# Patient Record
Sex: Male | Born: 1945 | Race: White | Hispanic: No | Marital: Married | Smoking: Former smoker
Health system: Southern US, Community
[De-identification: ages and names within clinical notes are randomized; demographics above are authoritative.]

## PROBLEM LIST (undated history)

## (undated) DIAGNOSIS — M1990 Unspecified osteoarthritis, unspecified site: Secondary | ICD-10-CM

## (undated) DIAGNOSIS — G4733 Obstructive sleep apnea (adult) (pediatric): Secondary | ICD-10-CM

## (undated) DIAGNOSIS — I700 Atherosclerosis of aorta: Secondary | ICD-10-CM

## (undated) DIAGNOSIS — Z952 Presence of prosthetic heart valve: Secondary | ICD-10-CM

## (undated) DIAGNOSIS — N529 Male erectile dysfunction, unspecified: Secondary | ICD-10-CM

## (undated) DIAGNOSIS — K759 Inflammatory liver disease, unspecified: Secondary | ICD-10-CM

## (undated) DIAGNOSIS — E119 Type 2 diabetes mellitus without complications: Secondary | ICD-10-CM

## (undated) DIAGNOSIS — K648 Other hemorrhoids: Secondary | ICD-10-CM

## (undated) DIAGNOSIS — K219 Gastro-esophageal reflux disease without esophagitis: Secondary | ICD-10-CM

## (undated) DIAGNOSIS — I219 Acute myocardial infarction, unspecified: Secondary | ICD-10-CM

## (undated) DIAGNOSIS — Z89511 Acquired absence of right leg below knee: Secondary | ICD-10-CM

## (undated) DIAGNOSIS — K22719 Barrett's esophagus with dysplasia, unspecified: Secondary | ICD-10-CM

## (undated) DIAGNOSIS — I5189 Other ill-defined heart diseases: Secondary | ICD-10-CM

## (undated) DIAGNOSIS — Q230 Congenital stenosis of aortic valve: Secondary | ICD-10-CM

## (undated) DIAGNOSIS — I739 Peripheral vascular disease, unspecified: Secondary | ICD-10-CM

## (undated) DIAGNOSIS — N400 Enlarged prostate without lower urinary tract symptoms: Secondary | ICD-10-CM

## (undated) DIAGNOSIS — Z7902 Long term (current) use of antithrombotics/antiplatelets: Secondary | ICD-10-CM

## (undated) DIAGNOSIS — N3281 Overactive bladder: Secondary | ICD-10-CM

## (undated) DIAGNOSIS — D369 Benign neoplasm, unspecified site: Secondary | ICD-10-CM

## (undated) DIAGNOSIS — Q231 Congenital insufficiency of aortic valve: Secondary | ICD-10-CM

## (undated) DIAGNOSIS — G4730 Sleep apnea, unspecified: Secondary | ICD-10-CM

## (undated) DIAGNOSIS — Z8616 Personal history of COVID-19: Secondary | ICD-10-CM

## (undated) DIAGNOSIS — J449 Chronic obstructive pulmonary disease, unspecified: Secondary | ICD-10-CM

## (undated) DIAGNOSIS — E785 Hyperlipidemia, unspecified: Secondary | ICD-10-CM

## (undated) DIAGNOSIS — I350 Nonrheumatic aortic (valve) stenosis: Secondary | ICD-10-CM

## (undated) DIAGNOSIS — R569 Unspecified convulsions: Secondary | ICD-10-CM

## (undated) DIAGNOSIS — H269 Unspecified cataract: Secondary | ICD-10-CM

## (undated) DIAGNOSIS — I10 Essential (primary) hypertension: Secondary | ICD-10-CM

## (undated) DIAGNOSIS — Z955 Presence of coronary angioplasty implant and graft: Secondary | ICD-10-CM

## (undated) DIAGNOSIS — T50905A Adverse effect of unspecified drugs, medicaments and biological substances, initial encounter: Secondary | ICD-10-CM

## (undated) DIAGNOSIS — I2510 Atherosclerotic heart disease of native coronary artery without angina pectoris: Secondary | ICD-10-CM

## (undated) DIAGNOSIS — I779 Disorder of arteries and arterioles, unspecified: Secondary | ICD-10-CM

## (undated) HISTORY — PX: CARDIAC VALVE REPLACEMENT: SHX585

## (undated) HISTORY — PX: TONSILLECTOMY: SUR1361

## (undated) HISTORY — PX: OTHER SURGICAL HISTORY: SHX169

## (undated) HISTORY — PX: THORACOTOMY: SUR1349

## (undated) HISTORY — DX: Unspecified cataract: H26.9

## (undated) HISTORY — PX: CARDIAC CATHETERIZATION: SHX172

## (undated) HISTORY — PX: FOOT AMPUTATION: SHX951

---

## 2002-02-12 DIAGNOSIS — I2510 Atherosclerotic heart disease of native coronary artery without angina pectoris: Secondary | ICD-10-CM

## 2002-02-12 DIAGNOSIS — I214 Non-ST elevation (NSTEMI) myocardial infarction: Secondary | ICD-10-CM

## 2002-02-12 HISTORY — DX: Atherosclerotic heart disease of native coronary artery without angina pectoris: I25.10

## 2002-02-12 HISTORY — PX: LEFT HEART CATH AND CORONARY ANGIOGRAPHY: CATH118249

## 2002-02-12 HISTORY — DX: Non-ST elevation (NSTEMI) myocardial infarction: I21.4

## 2002-02-13 HISTORY — PX: CORONARY ANGIOPLASTY WITH STENT PLACEMENT: SHX49

## 2005-09-26 DIAGNOSIS — Z86018 Personal history of other benign neoplasm: Secondary | ICD-10-CM

## 2005-09-26 HISTORY — DX: Personal history of other benign neoplasm: Z86.018

## 2006-12-25 ENCOUNTER — Ambulatory Visit: Payer: Self-pay | Admitting: Unknown Physician Specialty

## 2009-04-21 ENCOUNTER — Ambulatory Visit: Payer: Self-pay | Admitting: Family Medicine

## 2011-02-14 ENCOUNTER — Ambulatory Visit: Payer: Self-pay | Admitting: Internal Medicine

## 2011-03-29 DIAGNOSIS — Z952 Presence of prosthetic heart valve: Secondary | ICD-10-CM

## 2011-03-29 HISTORY — DX: Presence of prosthetic heart valve: Z95.2

## 2011-03-29 HISTORY — PX: AORTIC VALVE REPLACEMENT: SHX41

## 2011-04-08 ENCOUNTER — Other Ambulatory Visit: Payer: Self-pay | Admitting: Internal Medicine

## 2011-04-27 ENCOUNTER — Encounter: Payer: Self-pay | Admitting: Internal Medicine

## 2012-03-08 ENCOUNTER — Ambulatory Visit: Payer: Self-pay | Admitting: Unknown Physician Specialty

## 2012-03-12 LAB — PATHOLOGY REPORT

## 2014-01-15 DIAGNOSIS — Z952 Presence of prosthetic heart valve: Secondary | ICD-10-CM | POA: Insufficient documentation

## 2014-01-15 DIAGNOSIS — I2510 Atherosclerotic heart disease of native coronary artery without angina pectoris: Secondary | ICD-10-CM | POA: Insufficient documentation

## 2014-01-15 DIAGNOSIS — Q231 Congenital insufficiency of aortic valve: Secondary | ICD-10-CM | POA: Insufficient documentation

## 2014-01-15 DIAGNOSIS — Q2381 Bicuspid aortic valve: Secondary | ICD-10-CM | POA: Insufficient documentation

## 2014-10-16 LAB — HEMOGLOBIN A1C: Hgb A1c MFr Bld: 6.5 % — AB (ref 4.0–6.0)

## 2014-10-20 LAB — LIPID PANEL
Cholesterol: 152 mg/dL (ref 0–200)
HDL: 37 mg/dL (ref 35–70)
LDL CALC: 43 mg/dL
LDL/HDL RATIO: 1.2
Triglycerides: 358 mg/dL — AB (ref 40–160)

## 2014-10-20 LAB — CBC AND DIFFERENTIAL
HCT: 42 % (ref 41–53)
HEMOGLOBIN: 14.5 g/dL (ref 13.5–17.5)
Neutrophils Absolute: 4 /uL
Platelets: 203 10*3/uL (ref 150–399)
WBC: 7 10*3/mL

## 2014-10-20 LAB — HEPATIC FUNCTION PANEL
ALK PHOS: 103 U/L (ref 25–125)
ALT: 19 U/L (ref 10–40)
AST: 33 U/L (ref 14–40)
BILIRUBIN, TOTAL: 0.9 mg/dL

## 2014-10-20 LAB — BASIC METABOLIC PANEL
BUN: 18 mg/dL (ref 4–21)
CREATININE: 1 mg/dL (ref <=1.3)
Potassium: 4.4 mmol/L (ref 3.4–5.3)
SODIUM: 140 mmol/L (ref 137–147)

## 2014-10-20 LAB — TSH: TSH: 2.86 u[IU]/mL (ref 0.41–5.90)

## 2015-02-12 DIAGNOSIS — I10 Essential (primary) hypertension: Secondary | ICD-10-CM | POA: Insufficient documentation

## 2015-02-18 DIAGNOSIS — K2270 Barrett's esophagus without dysplasia: Secondary | ICD-10-CM | POA: Insufficient documentation

## 2015-02-18 DIAGNOSIS — Z86010 Personal history of colonic polyps: Secondary | ICD-10-CM | POA: Insufficient documentation

## 2015-03-17 ENCOUNTER — Encounter: Payer: Self-pay | Admitting: *Deleted

## 2015-03-18 ENCOUNTER — Ambulatory Visit: Payer: PPO | Admitting: Anesthesiology

## 2015-03-18 ENCOUNTER — Encounter
Admission: RE | Disposition: A | Discharge status: Home / Self Care | Payer: Self-pay | Source: Ambulatory Visit | Attending: Unknown Physician Specialty

## 2015-03-18 ENCOUNTER — Ambulatory Visit
Admission: RE | Admit: 2015-03-18 | Discharge: 2015-03-18 | Disposition: A | Discharge status: Home / Self Care | Payer: PPO | Source: Ambulatory Visit | Attending: Unknown Physician Specialty | Admitting: Unknown Physician Specialty

## 2015-03-18 DIAGNOSIS — J449 Chronic obstructive pulmonary disease, unspecified: Secondary | ICD-10-CM | POA: Insufficient documentation

## 2015-03-18 DIAGNOSIS — I10 Essential (primary) hypertension: Secondary | ICD-10-CM | POA: Insufficient documentation

## 2015-03-18 DIAGNOSIS — K2950 Unspecified chronic gastritis without bleeding: Secondary | ICD-10-CM | POA: Diagnosis not present

## 2015-03-18 DIAGNOSIS — R1310 Dysphagia, unspecified: Secondary | ICD-10-CM | POA: Insufficient documentation

## 2015-03-18 DIAGNOSIS — B159 Hepatitis A without hepatic coma: Secondary | ICD-10-CM | POA: Insufficient documentation

## 2015-03-18 DIAGNOSIS — G4730 Sleep apnea, unspecified: Secondary | ICD-10-CM | POA: Diagnosis not present

## 2015-03-18 DIAGNOSIS — Z86010 Personal history of colonic polyps: Secondary | ICD-10-CM | POA: Diagnosis not present

## 2015-03-18 DIAGNOSIS — K5730 Diverticulosis of large intestine without perforation or abscess without bleeding: Secondary | ICD-10-CM | POA: Diagnosis not present

## 2015-03-18 DIAGNOSIS — E119 Type 2 diabetes mellitus without complications: Secondary | ICD-10-CM | POA: Insufficient documentation

## 2015-03-18 DIAGNOSIS — Z6838 Body mass index (BMI) 38.0-38.9, adult: Secondary | ICD-10-CM | POA: Insufficient documentation

## 2015-03-18 DIAGNOSIS — K640 First degree hemorrhoids: Secondary | ICD-10-CM | POA: Insufficient documentation

## 2015-03-18 DIAGNOSIS — Z952 Presence of prosthetic heart valve: Secondary | ICD-10-CM | POA: Insufficient documentation

## 2015-03-18 DIAGNOSIS — D120 Benign neoplasm of cecum: Secondary | ICD-10-CM | POA: Diagnosis not present

## 2015-03-18 DIAGNOSIS — I252 Old myocardial infarction: Secondary | ICD-10-CM | POA: Insufficient documentation

## 2015-03-18 DIAGNOSIS — I2510 Atherosclerotic heart disease of native coronary artery without angina pectoris: Secondary | ICD-10-CM | POA: Diagnosis not present

## 2015-03-18 DIAGNOSIS — Z79899 Other long term (current) drug therapy: Secondary | ICD-10-CM | POA: Diagnosis not present

## 2015-03-18 DIAGNOSIS — K317 Polyp of stomach and duodenum: Secondary | ICD-10-CM | POA: Insufficient documentation

## 2015-03-18 DIAGNOSIS — E785 Hyperlipidemia, unspecified: Secondary | ICD-10-CM | POA: Diagnosis not present

## 2015-03-18 DIAGNOSIS — Z9861 Coronary angioplasty status: Secondary | ICD-10-CM | POA: Insufficient documentation

## 2015-03-18 DIAGNOSIS — Z87891 Personal history of nicotine dependence: Secondary | ICD-10-CM | POA: Insufficient documentation

## 2015-03-18 DIAGNOSIS — Z89439 Acquired absence of unspecified foot: Secondary | ICD-10-CM | POA: Insufficient documentation

## 2015-03-18 DIAGNOSIS — K2270 Barrett's esophagus without dysplasia: Secondary | ICD-10-CM | POA: Diagnosis present

## 2015-03-18 HISTORY — PX: COLONOSCOPY WITH PROPOFOL: SHX5780

## 2015-03-18 HISTORY — DX: Hyperlipidemia, unspecified: E78.5

## 2015-03-18 HISTORY — DX: Inflammatory liver disease, unspecified: K75.9

## 2015-03-18 HISTORY — DX: Barrett's esophagus with dysplasia, unspecified: K22.719

## 2015-03-18 HISTORY — DX: Essential (primary) hypertension: I10

## 2015-03-18 HISTORY — DX: Congenital stenosis of aortic valve: Q23.0

## 2015-03-18 HISTORY — DX: Acute myocardial infarction, unspecified: I21.9

## 2015-03-18 HISTORY — DX: Unspecified convulsions: R56.9

## 2015-03-18 HISTORY — DX: Type 2 diabetes mellitus without complications: E11.9

## 2015-03-18 HISTORY — DX: Atherosclerotic heart disease of native coronary artery without angina pectoris: I25.10

## 2015-03-18 HISTORY — DX: Nonrheumatic aortic (valve) stenosis: I35.0

## 2015-03-18 HISTORY — PX: ESOPHAGOGASTRODUODENOSCOPY: SHX5428

## 2015-03-18 HISTORY — DX: Chronic obstructive pulmonary disease, unspecified: J44.9

## 2015-03-18 HISTORY — DX: Congenital insufficiency of aortic valve: Q23.1

## 2015-03-18 HISTORY — DX: Sleep apnea, unspecified: G47.30

## 2015-03-18 LAB — GLUCOSE, CAPILLARY: Glucose-Capillary: 157 mg/dL — ABNORMAL HIGH (ref 65–99)

## 2015-03-18 SURGERY — COLONOSCOPY WITH PROPOFOL
Anesthesia: General

## 2015-03-18 MED ORDER — EPHEDRINE SULFATE 50 MG/ML IJ SOLN
INTRAMUSCULAR | Status: DC | PRN
  Administered 2015-03-18: 10 mg via INTRAVENOUS
  Administered 2015-03-18: 5 mg via INTRAVENOUS

## 2015-03-18 MED ORDER — PHENYLEPHRINE HCL 10 MG/ML IJ SOLN
INTRAMUSCULAR | Status: DC | PRN
  Administered 2015-03-18: 50 ug via INTRAVENOUS

## 2015-03-18 MED ORDER — FENTANYL CITRATE (PF) 100 MCG/2ML IJ SOLN
INTRAMUSCULAR | Status: DC | PRN
  Administered 2015-03-18: 50 ug via INTRAVENOUS

## 2015-03-18 MED ORDER — SODIUM CHLORIDE 0.9 % IV SOLN
INTRAVENOUS | Status: DC
  Administered 2015-03-18: 08:00:00 via INTRAVENOUS

## 2015-03-18 MED ORDER — MIDAZOLAM HCL 2 MG/2ML IJ SOLN
INTRAMUSCULAR | Status: DC | PRN
  Administered 2015-03-18: 1 mg via INTRAVENOUS

## 2015-03-18 MED ORDER — BUTAMBEN-TETRACAINE-BENZOCAINE 2-2-14 % EX AERO
INHALATION_SPRAY | CUTANEOUS | Status: AC
  Filled 2015-03-18: qty 20

## 2015-03-18 MED ORDER — PIPERACILLIN-TAZOBACTAM 3.375 G IVPB 30 MIN
3.3750 g | Freq: Once | INTRAVENOUS | Status: AC
  Administered 2015-03-18: 3.375 g via INTRAVENOUS
  Filled 2015-03-18: qty 50

## 2015-03-18 MED ORDER — SODIUM CHLORIDE 0.9 % IV SOLN: INTRAVENOUS | Status: DC

## 2015-03-18 MED ORDER — GLYCOPYRROLATE 0.2 MG/ML IJ SOLN
INTRAMUSCULAR | Status: DC | PRN
  Administered 2015-03-18: 0.1 mg via INTRAVENOUS

## 2015-03-18 MED ORDER — PROPOFOL INFUSION 10 MG/ML OPTIME
INTRAVENOUS | Status: DC | PRN
  Administered 2015-03-18: 140 ug/kg/min via INTRAVENOUS

## 2015-03-18 MED ORDER — LIDOCAINE HCL (CARDIAC) 20 MG/ML IV SOLN
INTRAVENOUS | Status: DC | PRN
  Administered 2015-03-18: 50 mg via INTRAVENOUS

## 2015-03-18 NOTE — OR Nursing (Signed)
cetacine spray used at [phone]

## 2015-03-18 NOTE — Anesthesia Postprocedure Evaluation (Signed)
  Anesthesia Post-op Note  Patient: Andrew Lyons  Procedure(s) Performed: Procedure(s): COLONOSCOPY WITH PROPOFOL (N/A) ESOPHAGOGASTRODUODENOSCOPY (EGD) (N/A)  Anesthesia type:General  Patient location: PACU  Post pain: Pain level controlled  Post assessment: Post-op Vital signs reviewed, Patient's Cardiovascular Status Stable, Respiratory Function Stable, Patent Airway and No signs of Nausea or vomiting  Post vital signs: Reviewed and stable  Last Vitals:  Filed Vitals:   03/18/15 0910  BP: 107/47  Pulse: 85  Temp:   Resp: 22    Level of consciousness: awake, alert  and patient cooperative  Complications: No apparent anesthesia complications

## 2015-03-18 NOTE — Op Note (Signed)
[facility] Gastroenterology Patient Name: Andrew Lyons Procedure Date: 03/18/2015 8:08 AM MRN: [id number] Account #: [id number] Date of Birth: 06-26-1946 Admit Type: Outpatient Age: 69 Room: [facility] ENDO ROOM 4 Gender: Male Note Status: Finalized Procedure:         Upper GI endoscopy Indications:       Follow-up of Barrett's esophagus Providers:         [name], MD Referring MD:      Janine Ores. Rosanna Randy, MD (Referring MD) Medicines:         Propofol per Anesthesia Complications:     No immediate complications. Procedure:         Pre-Anesthesia Assessment:                    - After reviewing the risks and benefits, the patient was                     deemed in satisfactory condition to undergo the procedure.                    After obtaining informed consent, the endoscope was passed                     under direct vision. Throughout the procedure, the                     patient's blood pressure, pulse, and oxygen saturations                     were monitored continuously. The Olympus GIF-160 endoscope                     (S#. [id number]) was introduced through the mouth, and                     advanced to the second part of duodenum. The upper GI                     endoscopy was accomplished without difficulty. The patient                     tolerated the procedure well. Findings:      There were esophageal mucosal changes secondary to established       short-segment Barrett's disease present at the gastroesophageal       junction. The maximum longitudinal extent of these mucosal changes was 1       cm in length. Mucosa was biopsied with a cold forceps for histology. One       specimen bottle was sent to pathology.      Five small sessile polyps with no bleeding and stigmata of recent       bleeding were found in the gastric body. These polyps were removed with       a hot snare. Resection complete ;and none recovered, they look benign      The  examined duodenum was normal. Impression:        - Esophageal mucosal changes secondary to established                     short-segment Barrett's disease. Biopsied.                    - Five gastric polyps. Resected and retrieved.                    -  Normal examined duodenum. Recommendation:    - Await pathology results. [name], MD 03/18/2015 8:29:08 AM This report has been signed electronically. Number of Addenda: 0 Note Initiated On: 03/18/2015 8:08 AM      [facility]

## 2015-03-18 NOTE — Anesthesia Preprocedure Evaluation (Signed)
Anesthesia Evaluation  Patient identified by MRN, date of birth, ID band Patient awake    Reviewed: Allergy & Precautions, NPO status , Patient's Chart, lab work & pertinent test results  History of Anesthesia Complications Negative for: history of anesthetic complications  Airway Mallampati: II       Dental  (+) Teeth Intact   Pulmonary COPDformer smoker,    Pulmonary exam normal       Cardiovascular hypertension, Pt. on medications and Pt. on home beta blockers + CAD and + Past MI Rhythm:Regular     Neuro/Psych Seizures -,  negative psych ROS   GI/Hepatic negative GI ROS, (+) Hepatitis -, A  Endo/Other  diabetes, Well Controlled, Type 2, Oral Hypoglycemic AgentsMorbid obesity  Renal/GU negative Renal ROS  negative genitourinary   Musculoskeletal negative musculoskeletal ROS (+)   Abdominal (+) + obese,   Peds negative pediatric ROS (+)  Hematology negative hematology ROS (+)   Anesthesia Other Findings   Reproductive/Obstetrics negative OB ROS                             Anesthesia Physical Anesthesia Plan  ASA: III  Anesthesia Plan: General   Post-op Pain Management:    Induction: Intravenous  Airway Management Planned: Nasal Cannula  Additional Equipment:   Intra-op Plan:   Post-operative Plan:   Informed Consent: I have reviewed the patients History and Physical, chart, labs and discussed the procedure including the risks, benefits and alternatives for the proposed anesthesia with the patient or authorized representative who has indicated his/her understanding and acceptance.     Plan Discussed with: CRNA  Anesthesia Plan Comments:         Anesthesia Quick Evaluation

## 2015-03-18 NOTE — Op Note (Signed)
[facility] Gastroenterology Patient Name: Andrew Lyons Procedure Date: 03/18/2015 8:08 AM MRN: [id number] Account #: [id number] Date of Birth: Jan 23, 1946 Admit Type: Outpatient Age: 69 Room: [facility] ENDO ROOM 4 Gender: Male Note Status: Finalized Procedure:         Colonoscopy Indications:       Personal history of colonic polyps Providers:         [name], MD Referring MD:      Janine Ores. Rosanna Randy, MD (Referring MD) Medicines:         Propofol per Anesthesia Complications:     No immediate complications. Procedure:         Pre-Anesthesia Assessment:                    - After reviewing the risks and benefits, the patient was                     deemed in satisfactory condition to undergo the procedure.                    After obtaining informed consent, the colonoscope was                     passed under direct vision. Throughout the procedure, the                     patient's blood pressure, pulse, and oxygen saturations                     were monitored continuously. The Colonoscope was                     introduced through the anus and advanced to the the cecum,                     identified by appendiceal orifice and ileocecal valve. The                     colonoscopy was performed without difficulty. The patient                     tolerated the procedure well. The quality of the bowel                     preparation was good. Findings:      A diminutive polyp was found in the cecum. The polyp was sessile. The       polyp was removed with a jumbo cold forceps. Resection and retrieval       were complete.      Multiple medium-mouthed diverticula were found in the sigmoid colon, in       the descending colon and in the transverse colon.      Internal hemorrhoids were found during endoscopy. The hemorrhoids were       medium-sized and Grade I (internal hemorrhoids that do not prolapse).      Prostate has multiple nodules, small, on rectal  exam. Impression:        - One diminutive polyp in the cecum. Resected and                     retrieved.                    - Diverticulosis in the sigmoid colon, in the  descending                     colon and in the transverse colon.                    - Internal hemorrhoids. Recommendation:    - Await pathology results.                    - The findings and recommendations were discussed with the                     patient's family.                    SEE A UROLOGIST [name], MD 03/18/2015 8:49:46 AM This report has been signed electronically. Number of Addenda: 0 Note Initiated On: 03/18/2015 8:08 AM Scope Withdrawal Time: 0 hours 9 minutes 54 seconds  Total Procedure Duration: 0 hours 13 minutes 55 seconds       [facility]

## 2015-03-18 NOTE — Transfer of Care (Signed)
Immediate Anesthesia Transfer of Care Note  Patient: Andrew Lyons  Procedure(s) Performed: Procedure(s): COLONOSCOPY WITH PROPOFOL (N/A) ESOPHAGOGASTRODUODENOSCOPY (EGD) (N/A)  Patient Location: PACU  Anesthesia Type:General  Level of Consciousness: awake and sedated  Airway & Oxygen Therapy: Patient Spontanous Breathing and Patient connected to nasal cannula oxygen  Post-op Assessment: Report given to RN and Post -op Vital signs reviewed and stable  Post vital signs: Reviewed and stable  Last Vitals:  Filed Vitals:   03/18/15 0850  BP: 121/54  Pulse: 82  Temp:   Resp: 24    Complications: No apparent anesthesia complications

## 2015-03-18 NOTE — H&P (Signed)
Primary Care Physician:  No primary care provider on file. Primary Gastroenterologist:  Dr. Vira Agar  Pre-Procedure History & Physical: HPI:  Andrew Lyons is a 69 y.o. male is here for an endoscopy and colonoscopy.   Past Medical History  Diagnosis Date  . Coronary artery disease   . Hypertension   . Hyperlipidemia   . Sleep apnea   . Myocardial infarction   . Seizures   . COPD (chronic obstructive pulmonary disease)   . Hepatitis   . Barrett's esophagus with dysplasia   . 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 Diabetes mellitus without complication     Past Surgical History  Procedure Laterality Date  . Cardiac catheterization      with Angioplasty  . Cardiac valve replacement      Aortic Valve Replacement  . Tonsillectomy    . Thoracotomy Right   . Foot amputation      Prior to Admission medications   Medication Sig Start Date End Date Taking? Authorizing Provider  amLODipine-benazepril (LOTREL) 5-10 MG per capsule Take 1 capsule by mouth daily.   Yes Historical Provider, MD  aspirin 325 MG EC tablet Take 325 mg by mouth daily.   Yes Historical Provider, MD  Canagliflozin-Metformin HCl (INVOKAMET) 150-500 MG TABS Take 1 tablet by mouth every morning.   Yes Historical Provider, MD  co-enzyme Q-10 30 MG capsule Take 100 mg by mouth daily.   Yes Historical Provider, MD  Liraglutide (VICTOZA) 18 MG/3ML SOPN Inject 1.2 mg into the skin daily.   Yes Historical Provider, MD  metoprolol succinate (TOPROL-XL) 100 MG 24 hr tablet Take 100 mg by mouth daily. Take with or immediately following a meal.   Yes Historical Provider, MD  montelukast (SINGULAIR) 10 MG tablet Take 10 mg by mouth at bedtime.   Yes Historical Provider, MD  naftifine (NAFTIN) 1 % cream Apply topically daily.   Yes Historical Provider, MD  omeprazole (PRILOSEC) 20 MG capsule Take 20 mg by mouth daily.   Yes Historical Provider, MD  rosuvastatin (CRESTOR) 20 MG tablet Take 20 mg by mouth daily.    Yes Historical Provider, MD  sildenafil (VIAGRA) 100 MG tablet Take 100 mg by mouth daily as needed for erectile dysfunction.   Yes Historical Provider, MD    Allergies as of 02/18/2015  . (Not on File)    History reviewed. No pertinent family history.  History   Social History  . Marital Status: Married    Spouse Name: N/A  . Number of Children: N/A  . Years of Education: N/A   Occupational History  . Not on file.   Social History Main Topics  . Smoking status: Former Research scientist (life sciences)  . Smokeless tobacco: Never Used  . Alcohol Use: No  . Drug Use: No  . Sexual Activity: Not on file   Other Topics Concern  . Not on file   Social History Narrative    Review of Systems: See HPI, otherwise negative ROS  Physical Exam: BP 138/73 mmHg  Pulse 68  Temp(Src) 97.3 F (36.3 C) (Tympanic)  Resp 17  Ht 5\' 10"  (1.778 m)  Wt 122.471 kg (270 lb)  BMI 38.74 kg/m2  SpO2 98% General:   Alert,  pleasant and cooperative in NAD Head:  Normocephalic and atraumatic. Neck:  Supple; no masses or thyromegaly. Lungs:  Clear throughout to auscultation.    Heart:  Regular rate and rhythm. Abdomen:  Soft, nontender and nondistended. Normal bowel sounds, without guarding,  and without rebound.   Neurologic:  Alert and  oriented x4;  grossly normal neurologically.  Impression/Plan: Andrew Lyons is here for an endoscopy and colonoscopy to be performed for personal history of colon polyps and follow up Barretts esophagus  Risks, benefits, limitations, and alternatives regarding  endoscopy and colonoscopy have been reviewed with the patient.  Questions have been answered.  All parties agreeable.   Gaylyn Cheers, MD  03/18/2015, 8:05 AM   Primary Care Physician:  No primary care provider on file. Primary Gastroenterologist:  Dr. Vira Agar  Pre-Procedure History & Physical: HPI:  Andrew Lyons is a 69 y.o. male is here for an endoscopy and colonoscopy.   Past Medical History   Diagnosis Date  . Coronary artery disease   . Hypertension   . Hyperlipidemia   . Sleep apnea   . Myocardial infarction   . Seizures   . COPD (chronic obstructive pulmonary disease)   . Hepatitis   . Barrett's esophagus with dysplasia   . 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 Diabetes mellitus without complication     Past Surgical History  Procedure Laterality Date  . Cardiac catheterization      with Angioplasty  . Cardiac valve replacement      Aortic Valve Replacement  . Tonsillectomy    . Thoracotomy Right   . Foot amputation      Prior to Admission medications   Medication Sig Start Date End Date Taking? Authorizing Provider  amLODipine-benazepril (LOTREL) 5-10 MG per capsule Take 1 capsule by mouth daily.   Yes Historical Provider, MD  aspirin 325 MG EC tablet Take 325 mg by mouth daily.   Yes Historical Provider, MD  Canagliflozin-Metformin HCl (INVOKAMET) 150-500 MG TABS Take 1 tablet by mouth every morning.   Yes Historical Provider, MD  co-enzyme Q-10 30 MG capsule Take 100 mg by mouth daily.   Yes Historical Provider, MD  Liraglutide (VICTOZA) 18 MG/3ML SOPN Inject 1.2 mg into the skin daily.   Yes Historical Provider, MD  metoprolol succinate (TOPROL-XL) 100 MG 24 hr tablet Take 100 mg by mouth daily. Take with or immediately following a meal.   Yes Historical Provider, MD  montelukast (SINGULAIR) 10 MG tablet Take 10 mg by mouth at bedtime.   Yes Historical Provider, MD  naftifine (NAFTIN) 1 % cream Apply topically daily.   Yes Historical Provider, MD  omeprazole (PRILOSEC) 20 MG capsule Take 20 mg by mouth daily.   Yes Historical Provider, MD  rosuvastatin (CRESTOR) 20 MG tablet Take 20 mg by mouth daily.   Yes Historical Provider, MD  sildenafil (VIAGRA) 100 MG tablet Take 100 mg by mouth daily as needed for erectile dysfunction.   Yes Historical Provider, MD    Allergies as of 02/18/2015  . (Not on File)    History reviewed. No pertinent  family history.  History   Social History  . Marital Status: Married    Spouse Name: N/A  . Number of Children: N/A  . Years of Education: N/A   Occupational History  . Not on file.   Social History Main Topics  . Smoking status: Former Research scientist (life sciences)  . Smokeless tobacco: Never Used  . Alcohol Use: No  . Drug Use: No  . Sexual Activity: Not on file   Other Topics Concern  . Not on file   Social History Narrative    Review of Systems: See HPI, otherwise negative ROS  Physical Exam: BP 138/73 mmHg  Pulse 68  Temp(Src) 97.3 F (36.3 C) (Tympanic)  Resp 17  Ht 5\' 10"  (1.778 m)  Wt 122.471 kg (270 lb)  BMI 38.74 kg/m2  SpO2 98% General:   Alert,  pleasant and cooperative in NAD Head:  Normocephalic and atraumatic. Neck:  Supple; no masses or thyromegaly. Lungs:  Clear throughout to auscultation.    Heart:  Regular rate and rhythm. Abdomen:  Soft, nontender and nondistended. Normal bowel sounds, without guarding, and without rebound.   Neurologic:  Alert and  oriented x4;  grossly normal neurologically.  Impression/Plan: Andrew Lyons is here for an endoscopy and colonoscopy to be performed for personal history of colon polyps and history of Barretts esophagus.  Risks, benefits, limitations, and alternatives regarding  endoscopy and colonoscopy have been reviewed with the patient.  Questions have been answered.  All parties agreeable.   Gaylyn Cheers, MD  03/18/2015, 8:05 AM

## 2015-03-18 NOTE — Anesthesia Procedure Notes (Signed)
Performed by: COOK-MARTIN, Nashia Remus Pre-anesthesia Checklist: Patient identified, Emergency Drugs available, Suction available, Patient being monitored and Timeout performed Patient Re-evaluated:Patient Re-evaluated prior to inductionOxygen Delivery Method: Nasal cannula Preoxygenation: Pre-oxygenation with 100% oxygen Intubation Type: IV induction Airway Equipment and Method: Bite block     

## 2015-03-20 LAB — SURGICAL PATHOLOGY

## 2015-03-31 DIAGNOSIS — J309 Allergic rhinitis, unspecified: Secondary | ICD-10-CM | POA: Insufficient documentation

## 2015-03-31 DIAGNOSIS — N529 Male erectile dysfunction, unspecified: Secondary | ICD-10-CM | POA: Insufficient documentation

## 2015-03-31 DIAGNOSIS — E782 Mixed hyperlipidemia: Secondary | ICD-10-CM | POA: Insufficient documentation

## 2015-03-31 DIAGNOSIS — I10 Essential (primary) hypertension: Secondary | ICD-10-CM | POA: Insufficient documentation

## 2015-03-31 DIAGNOSIS — R5383 Other fatigue: Secondary | ICD-10-CM

## 2015-03-31 DIAGNOSIS — K219 Gastro-esophageal reflux disease without esophagitis: Secondary | ICD-10-CM | POA: Insufficient documentation

## 2015-03-31 DIAGNOSIS — E669 Obesity, unspecified: Secondary | ICD-10-CM | POA: Insufficient documentation

## 2015-03-31 DIAGNOSIS — N400 Enlarged prostate without lower urinary tract symptoms: Secondary | ICD-10-CM | POA: Insufficient documentation

## 2015-03-31 DIAGNOSIS — G4733 Obstructive sleep apnea (adult) (pediatric): Secondary | ICD-10-CM | POA: Insufficient documentation

## 2015-03-31 DIAGNOSIS — M1990 Unspecified osteoarthritis, unspecified site: Secondary | ICD-10-CM | POA: Insufficient documentation

## 2015-03-31 DIAGNOSIS — E785 Hyperlipidemia, unspecified: Secondary | ICD-10-CM | POA: Insufficient documentation

## 2015-03-31 DIAGNOSIS — E119 Type 2 diabetes mellitus without complications: Secondary | ICD-10-CM | POA: Insufficient documentation

## 2015-03-31 DIAGNOSIS — I2510 Atherosclerotic heart disease of native coronary artery without angina pectoris: Secondary | ICD-10-CM | POA: Insufficient documentation

## 2015-03-31 DIAGNOSIS — R5381 Other malaise: Secondary | ICD-10-CM | POA: Insufficient documentation

## 2015-04-09 ENCOUNTER — Telehealth: Payer: Self-pay | Admitting: Family Medicine

## 2015-04-09 NOTE — Telephone Encounter (Signed)
Pt contacted office for refill request on the following medications: Canagliflozin-Metformin HCl (INVOKAMET) 150-500 MG TABS.  Pt is request 180 pills for 90 day supply.  [facility].  CB#[phone]/MW  Pt wife is also asking if there is a Rx for Canagliflozin-Metformin HCl (INVOKAMET) 150-500 MG and Liraglutide (VICTOZA) 18 MG/3ML that will be a little cheaper?

## 2015-04-09 NOTE — Telephone Encounter (Signed)
There is no generic for either one of these meds. He can stop one of them and see how his diabetes does for the next A1c.

## 2015-04-09 NOTE — Telephone Encounter (Signed)
Patient wants to see about something in place of Invokamet and Victoza that would be cheaper.

## 2015-04-10 NOTE — Telephone Encounter (Signed)
Patient just got Victoza filled and he thinks he is ok on Invokamet for now.  He will try to come off of the Victoza when this 3 month supply runs out since it is the more expensive one.   Andrew Lyons

## 2015-04-14 ENCOUNTER — Encounter: Payer: Self-pay | Admitting: Urology

## 2015-04-14 ENCOUNTER — Ambulatory Visit (INDEPENDENT_AMBULATORY_CARE_PROVIDER_SITE_OTHER): Payer: PPO | Admitting: Urology

## 2015-04-14 VITALS — BP 143/72 | HR 68 | Ht 70.0 in | Wt 267.8 lb

## 2015-04-14 DIAGNOSIS — N390 Urinary tract infection, site not specified: Secondary | ICD-10-CM | POA: Diagnosis not present

## 2015-04-14 DIAGNOSIS — N400 Enlarged prostate without lower urinary tract symptoms: Secondary | ICD-10-CM | POA: Diagnosis not present

## 2015-04-14 LAB — URINALYSIS, COMPLETE
BILIRUBIN UA: NEGATIVE
Ketones, UA: NEGATIVE
LEUKOCYTES UA: NEGATIVE
NITRITE UA: NEGATIVE
PROTEIN UA: NEGATIVE
RBC UA: NEGATIVE
SPEC GRAV UA: 1.015 (ref 1.005–1.030)
UUROB: 0.2 mg/dL (ref 0.2–1.0)
pH, UA: 5 (ref 5.0–7.5)

## 2015-04-14 LAB — MICROSCOPIC EXAMINATION
Bacteria, UA: NONE SEEN
RBC, UA: NONE SEEN /hpf (ref 0–?)

## 2015-04-14 MED ORDER — TAMSULOSIN HCL 0.4 MG PO CAPS
0.4000 mg | ORAL_CAPSULE | Freq: Every day | ORAL | Status: DC
Start: 2015-04-14 — End: 2015-09-21

## 2015-04-14 NOTE — Progress Notes (Signed)
04/14/2015 11:45 AM   Andrew Lyons 08/06/1946 [id number]  Referring provider: [name]., MD [address] [place],  [address]  Chief Complaint  Patient presents with  . Other    new pt, possible prostate cancer     HPI: Patient presents with a question of need for circumcision and a report from rectal exam from another physician of nodules. No nodules are felt on the prostate but calculi are felt within the prostate. These are not nodules. He's had 1 episode of balanitis over the past couple of years. He is a diabetic. At the present time is foreskin is easily retracted and no circumcision as proposed. If he has several more episodes of balanitis then we certainly would consider doing circumcision. Additionally patient starts and stops his urine he does not have a good full stream. I have given him tamsulosin for this. He will follow up in 3 months. His sex life is good but he utilizes 20 mg of either Cialis or Viagra. 100 mg of Viagra caused too much flushing. He is not on any nitroglycerin.     PMH: Past Medical History  Diagnosis Date  . Coronary artery disease   . Hypertension   . Hyperlipidemia   . Sleep apnea   . Myocardial infarction   . Seizures   . COPD (chronic obstructive pulmonary disease)   . Hepatitis   . Barrett's esophagus with dysplasia   . 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 Diabetes mellitus without complication     Surgical History: Past Surgical History  Procedure Laterality Date  . Cardiac catheterization      with Angioplasty  . Cardiac valve replacement      Aortic Valve Replacement  . Tonsillectomy    . Thoracotomy Right   . Foot amputation    . Colonoscopy with propofol N/A 03/18/2015    Procedure: COLONOSCOPY WITH PROPOFOL;  Surgeon: [name], MD;  Location: [facility] ENDOSCOPY;  Service: Endoscopy;  Laterality: N/A;  . Esophagogastroduodenoscopy N/A 03/18/2015    Procedure:  ESOPHAGOGASTRODUODENOSCOPY (EGD);  Surgeon: [name], MD;  Location: [facility] ENDOSCOPY;  Service: Endoscopy;  Laterality: N/A;    Home Medications:    Medication List       This list is accurate as of: 04/14/15 11:45 AM.  Always use your most recent med list.               amLODipine-benazepril 5-10 MG per capsule  Commonly known as:  LOTREL  Take 1 capsule by mouth daily.     aspirin 325 MG EC tablet  Take 325 mg by mouth daily.     co-enzyme Q-10 30 MG capsule  Take 100 mg by mouth daily.     econazole nitrate 1 % cream  ECONAZOLE NITRATE, 1% (External Cream)  1 (one) application to rash twice a day for 0 days  Quantity: 15;  Refills: 0   Ordered :29-Dec-2014  Chrismon, Myrtie Soman;  Started 29-Dec-2014 Active     INVOKAMET 150-500 MG Tabs  Generic drug:  Canagliflozin-Metformin HCl  Take 1 tablet by mouth every morning.     metoprolol succinate 100 MG 24 hr tablet  Commonly known as:  TOPROL-XL  Take 100 mg by mouth daily. Take with or immediately following a meal.     montelukast 10 MG tablet  Commonly known as:  SINGULAIR  Take 10 mg by mouth at bedtime.     naftifine 1 %  cream  Commonly known as:  NAFTIN  Apply topically daily.     omeprazole 40 MG capsule  Commonly known as:  PRILOSEC  Take 40 mg by mouth daily.     rosuvastatin 20 MG tablet  Commonly known as:  CRESTOR  Take 20 mg by mouth daily.     sildenafil 100 MG tablet  Commonly known as:  VIAGRA  Take 100 mg by mouth daily as needed for erectile dysfunction.     VICTOZA 18 MG/3ML Sopn  Generic drug:  Liraglutide  Inject 1.2 mg into the skin daily.        Allergies:  Allergies  Allergen Reactions  . Zyban [Bupropion]   . Zocor [Simvastatin]   . Iodinated Diagnostic Agents Rash    had a red chest -unusre of what kind of dye it was    Family History: Family History  Problem Relation Age of Onset  . Obesity Son   . Diabetes Brother   . Hypertension Brother     Social History:   reports that he has quit smoking. He has never used smokeless tobacco. He reports that he drinks alcohol. He reports that he does not use illicit drugs.  ROS: UROLOGY Frequent Urination?: Yes Hard to postpone urination?: No Burning/pain with urination?: No Get up at night to urinate?: Yes Leakage of urine?: No Urine stream starts and stops?: Yes Trouble starting stream?: Yes Do you have to strain to urinate?: Yes Blood in urine?: No Urinary tract infection?: No Sexually transmitted disease?: No Injury to kidneys or bladder?: No Painful intercourse?: No Weak stream?: Yes Erection problems?: Yes Penile pain?: No  Gastrointestinal Nausea?: No Vomiting?: No Indigestion/heartburn?: Yes Diarrhea?: No Constipation?: No  Constitutional Fever: No Night sweats?: No Weight loss?: No Fatigue?: No  Skin Skin rash/lesions?: No Itching?: No  Eyes Blurred vision?: No Double vision?: No  Ears/Nose/Throat Sore throat?: No Sinus problems?: No  Hematologic/Lymphatic Swollen glands?: No Easy bruising?: Yes  Cardiovascular Leg swelling?: No Chest pain?: No  Respiratory Cough?: No Shortness of breath?: No  Endocrine Excessive thirst?: No  Musculoskeletal Back pain?: No Joint pain?: No  Neurological Headaches?: No Dizziness?: No  Psychologic Depression?: No Anxiety?: No  Physical Exam: BP 143/72 mmHg  Pulse 68  Ht 5\' 10"  (1.778 m)  Wt 267 lb 12.8 oz (121.473 kg)  BMI 38.43 kg/m2  Constitutional:  Alert and oriented, No acute distress. HEENT: [place] AT, moist mucus membranes.  Trachea midline, no masses. Cardiovascular: No clubbing, cyanosis, or edema. Respiratory: Normal respiratory effort, no increased work of breathing. GI: Abdomen is soft, nontender, nondistended, no abdominal masses GU: No CVA tenderness. Prostate small with calculi in the prostate and no distinct nodule. The prostate is symmetrical and firm. The penis is uncircumcised but the foreskin is  easily retracted with no evidences of diabetic stricturing of the skin. The testes are descended and normal. Patient is obviously overweight penile shaft within the mons pubis is normal field there are hemorrhoids on the rectal exam but no rectal masses. Skin: No rashes, bruises or suspicious lesions. Lymph: No cervical or inguinal adenopathy. Neurologic: Grossly intact, no focal deficits, moving all 4 extremities. Psychiatric: Normal mood and affect.  Laboratory Data: Lab Results  Component Value Date   WBC 7.0 10/20/2014   HGB 14.5 10/20/2014   HCT 42 10/20/2014   PLT 203 10/20/2014    Lab Results  Component Value Date   CREATININE 1.0 10/20/2014    No results found for: PSA  No results  found for: TESTOSTERONE  No results found for: HGBA1C  Urinalysis No results found for: COLORURINE, APPEARANCEUR, LABSPEC, PHURINE, GLUCOSEU, HGBUR, BILIRUBINUR, KETONESUR, PROTEINUR, UROBILINOGEN, NITRITE, LEUKOCYTESUR  Pertinent Imaging:none  Assessment & Plan:  Incomplete voiding. History of 1 episode of balanitis. No need for circumcision at this time but will obtain his PSA from his family doctor Dr. Rosanna Randy. Have placed the patient on 3 months of tamsulosin 0.4 mg daily for his voiding dysfunction  1. Recurrent UTI  - Urinalysis, Complete   No Follow-up on file.  Collier Flowers, [facility] [address], [place], [place] [address] [phone]

## 2015-04-22 ENCOUNTER — Ambulatory Visit (INDEPENDENT_AMBULATORY_CARE_PROVIDER_SITE_OTHER): Payer: PPO | Admitting: Family Medicine

## 2015-04-22 ENCOUNTER — Encounter: Payer: Self-pay | Admitting: Family Medicine

## 2015-04-22 VITALS — BP 126/72 | HR 80 | Temp 97.5°F | Resp 16 | Wt 270.0 lb

## 2015-04-22 DIAGNOSIS — E785 Hyperlipidemia, unspecified: Secondary | ICD-10-CM

## 2015-04-22 DIAGNOSIS — I2510 Atherosclerotic heart disease of native coronary artery without angina pectoris: Secondary | ICD-10-CM

## 2015-04-22 DIAGNOSIS — E118 Type 2 diabetes mellitus with unspecified complications: Secondary | ICD-10-CM

## 2015-04-22 DIAGNOSIS — I10 Essential (primary) hypertension: Secondary | ICD-10-CM | POA: Diagnosis not present

## 2015-04-22 LAB — POCT GLYCOSYLATED HEMOGLOBIN (HGB A1C): Hemoglobin A1C: 6.6

## 2015-04-22 NOTE — Progress Notes (Signed)
Patient ID: Andrew Lyons, male   DOB: 10/04/1945, 69 y.o.   MRN: [id number]    Subjective:  HPI  Diabetes Mellitus Type II, Follow-up:   No results found for: HGBA1C Last seen for diabetes 6 months ago.  Management changes included none. He reports good compliance with treatment. He is not having side effects.  Current symptoms include polyuria, coming from prostate, Dr. Elnoria Howard started him om Flomax. Home blood sugar records: Not being checked  Episodes of hypoglycemia? no   Current Insulin Regimen: n/a Most Recent Eye Exam: 3-4 months ago Weight trend: stable  ------------------------------------------------------------------------   Hypertension, follow-up:  BP Readings from Last 3 Encounters:  04/22/15 126/72  04/14/15 143/72  12/29/14 128/78    He was last seen for hypertension 6 months ago.  BP at that visit was 128/78. Management changes since that visit include .He reports good compliance with treatment. He is not having side effects.    Outside blood pressures are not being check. He is experiencing none.  Patient denies none.   Cardiovascular risk factors include none.   ------------------------------------------------------------------------    Lipid/Cholesterol, Follow-up:   Last seen for this 6 months ago.  Management changes since that visit include none.  Last Lipid Panel: No results found for: CHOL, TRIG, HDL, CHOLHDL, VLDL, LDLCALC, LDLDIRECT  He reports good compliance with treatment. He is not having side effects.   Wt Readings from Last 3 Encounters:  04/22/15 270 lb (122.471 kg)  04/14/15 267 lb 12.8 oz (121.473 kg)  12/29/14 271 lb (122.925 kg)    ------------------------------------------------------------------------     Prior to Admission medications   Medication Sig Start Date End Date Taking? Authorizing Provider  amLODipine-benazepril (LOTREL) 5-10 MG per capsule Take 1 capsule by mouth daily.   Yes Historical  Provider, MD  aspirin 325 MG EC tablet Take 325 mg by mouth daily.   Yes Historical Provider, MD  Canagliflozin-Metformin HCl (INVOKAMET) 150-500 MG TABS Take 1 tablet by mouth every morning.   Yes Historical Provider, MD  co-enzyme Q-10 30 MG capsule Take 100 mg by mouth daily.   Yes Historical Provider, MD  Liraglutide (VICTOZA) 18 MG/3ML SOPN Inject 1.2 mg into the skin daily.   Yes Historical Provider, MD  metoprolol succinate (TOPROL-XL) 100 MG 24 hr tablet Take 100 mg by mouth daily. Take with or immediately following a meal.   Yes Historical Provider, MD  montelukast (SINGULAIR) 10 MG tablet Take 10 mg by mouth at bedtime.   Yes Historical Provider, MD  naftifine (NAFTIN) 1 % cream Apply topically daily.   Yes Historical Provider, MD  omeprazole (PRILOSEC) 40 MG capsule Take 40 mg by mouth daily. 11/03/14  Yes Historical Provider, MD  rosuvastatin (CRESTOR) 20 MG tablet Take 20 mg by mouth daily.   Yes Historical Provider, MD  sildenafil (VIAGRA) 100 MG tablet Take 100 mg by mouth daily as needed for erectile dysfunction.   Yes Historical Provider, MD  tamsulosin (FLOMAX) 0.4 MG CAPS capsule Take 1 capsule (0.4 mg total) by mouth daily. 04/14/15  Yes Collier Flowers, MD    Patient Active Problem List   Diagnosis Date Noted  . Allergic rhinitis 03/31/2015  . Arthritis 03/31/2015  . Benign fibroma of prostate 03/31/2015  . Atherosclerosis of coronary artery 03/31/2015  . Diabetes 03/31/2015  . ED (erectile dysfunction) of organic origin 03/31/2015  . Acid reflux 03/31/2015  . HLD (hyperlipidemia) 03/31/2015  . BP (high blood pressure) 03/31/2015  . Malaise and fatigue 03/31/2015  .  Combined fat and carbohydrate induced hyperlipemia 03/31/2015  . Adiposity 03/31/2015  . Obstructive apnea 03/31/2015  . Barrett esophagus 02/18/2015  . H/O adenomatous polyp of colon 02/18/2015  . Benign essential HTN 02/12/2015  . Aortic valve, bicuspid 01/15/2014  . Arteriosclerosis of coronary artery  01/15/2014  . H/O aortic valve replacement 01/15/2014    Past Medical History  Diagnosis Date  . Coronary artery disease   . Hypertension   . Hyperlipidemia   . Sleep apnea   . Myocardial infarction   . Seizures   . COPD (chronic obstructive pulmonary disease)   . Hepatitis   . Barrett's esophagus with dysplasia   . 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 Diabetes mellitus without complication     History   Social History  . Marital Status: Married    Spouse Name: N/A  . Number of Children: N/A  . Years of Education: N/A   Occupational History  . Not on file.   Social History Main Topics  . Smoking status: Former Research scientist (life sciences)  . Smokeless tobacco: Never Used     Comment: Quit smoking in 2003; Started smoking at age 68, smoked about 40 years, smoked over 3 packs per Lyons  . Alcohol Use: Yes     Comment: Occcasional alcohol use  . Drug Use: No  . Sexual Activity: Not on file   Other Topics Concern  . Not on file   Social History Narrative    Allergies  Allergen Reactions  . Zyban [Bupropion]   . Zocor [Simvastatin]   . Iodinated Diagnostic Agents Rash    had a red chest -unusre of what kind of dye it was    Review of Systems  Constitutional: Negative.   HENT: Negative.   Eyes: Negative.   Respiratory: Negative.   Cardiovascular: Negative.   Gastrointestinal: Negative.   Genitourinary: Positive for frequency.  Musculoskeletal: Negative.   Skin: Negative.   Neurological: Negative.   Endo/Heme/Allergies: Negative.   Psychiatric/Behavioral: Negative.     Immunization History  Administered Date(s) Administered  . Pneumococcal Conjugate-13 08/14/2014  . Pneumococcal Polysaccharide-23 08/02/2007, 10/22/2012  . Tdap 06/09/2010  . Zoster 10/22/2012   Objective:  BP 126/72 mmHg  Pulse 80  Temp(Src) 97.5 F (36.4 C) (Oral)  Resp 16  Wt 270 lb (122.471 kg)  Physical Exam  Constitutional: He is oriented to person, place, and time and  well-developed, well-nourished, and in no distress.  HENT:  Head: Normocephalic and atraumatic.  Right Ear: External ear normal.  Left Ear: External ear normal.  Nose: Nose normal.  Eyes: Conjunctivae and EOM are normal. Pupils are equal, round, and reactive to light.  Neck: Normal range of motion. Neck supple.  Cardiovascular: Normal rate, regular rhythm, normal heart sounds and intact distal pulses.   Pulmonary/Chest: Effort normal and breath sounds normal.  Abdominal: Soft. Bowel sounds are normal.  Musculoskeletal: Normal range of motion.  Neurological: He is alert and oriented to person, place, and time. He has normal reflexes. Gait normal. GCS score is 15.  Skin: Skin is warm and dry.  Psychiatric: Mood, memory, affect and judgment normal.    Lab Results  Component Value Date   WBC 7.0 10/20/2014   HGB 14.5 10/20/2014   HCT 42 10/20/2014   PLT 203 10/20/2014    CMP     Component Value Date/Time   NA 140 10/20/2014   K 4.4 10/20/2014   BUN 18 10/20/2014   CREATININE 1.0 10/20/2014  Assessment and Plan :   1. Benign essential HTN   2. Type 2 diabetes mellitus with complication  - POCT HgB A1C--6.6 today.  3. Atherosclerosis of native coronary artery of native heart without angina pectoris All risk factors treated  4. HLD (hyperlipidemia)  5. Obesity Diet and exercise habits stressed.  Patient was seen and examined by Dr. Miguel Aschoff, and noted scribed by Webb Laws, [place]on MD [facility] 04/22/2015 8:22 AM

## 2015-04-30 ENCOUNTER — Ambulatory Visit: Payer: Self-pay | Admitting: Family Medicine

## 2015-06-02 ENCOUNTER — Telehealth: Payer: Self-pay | Admitting: Family Medicine

## 2015-06-02 NOTE — Telephone Encounter (Signed)
Wife informed to come and pick up co pay cards at the front desk.

## 2015-06-02 NOTE — Telephone Encounter (Signed)
Pt's wife wanted to know if we have discount cards for Canagliflozin-Metformin HCl (INVOKAMET) 150-500 MG TABS b/c it cost to much out of pocket. Pt's wife stated she had contacted the company that makes the medication and she was told we have cards in the office for a free 30 day supply. Please advise. Thanks TNP

## 2015-06-21 ENCOUNTER — Other Ambulatory Visit: Payer: Self-pay | Admitting: Family Medicine

## 2015-07-17 ENCOUNTER — Ambulatory Visit (INDEPENDENT_AMBULATORY_CARE_PROVIDER_SITE_OTHER): Payer: PPO | Admitting: Urology

## 2015-07-17 ENCOUNTER — Encounter: Payer: Self-pay | Admitting: Urology

## 2015-07-17 VITALS — BP 153/81 | HR 60 | Ht 70.0 in | Wt 268.7 lb

## 2015-07-17 DIAGNOSIS — N3281 Overactive bladder: Secondary | ICD-10-CM

## 2015-07-17 DIAGNOSIS — R339 Retention of urine, unspecified: Secondary | ICD-10-CM

## 2015-07-17 LAB — URINALYSIS, COMPLETE
BILIRUBIN UA: NEGATIVE
KETONES UA: NEGATIVE
Leukocytes, UA: NEGATIVE
NITRITE UA: NEGATIVE
Protein, UA: NEGATIVE
RBC, UA: NEGATIVE
Specific Gravity, UA: 1.01 (ref 1.005–1.030)
Urobilinogen, Ur: 0.2 mg/dL (ref 0.2–1.0)
pH, UA: 5 (ref 5.0–7.5)

## 2015-07-17 LAB — BLADDER SCAN AMB NON-IMAGING: Scan Result: 21

## 2015-07-17 LAB — MICROSCOPIC EXAMINATION
BACTERIA UA: NONE SEEN
Epithelial Cells (non renal): NONE SEEN /hpf (ref 0–10)
RBC, UA: NONE SEEN /hpf (ref 0–?)
RENAL EPITHEL UA: NONE SEEN /HPF
WBC UA: NONE SEEN /HPF (ref 0–?)

## 2015-07-17 NOTE — Progress Notes (Signed)
07/17/2015 9:34 AM   Andrew Lyons [birth year] [id number]  Referring provider: [name]., MD [address] [place], [place] [id number]  Chief Complaint  Patient presents with  . Urinary Retention    HPI: Previous complaint of urinary retention and incomplete emptying now with minimal postvoid residual after utilizing tamsulosin 2 months. We'll maintain him on his tamsulosin. He's also complaining of nocturia 3 with urgency. He has some burning before he voids when he has the urgency. Drinks minimal coffee tea but does drink a. He has no soda intake. So I advised him about caffeinated beverages and the effect on the bladder for urgency frequency. Most of his coffee in the morning. He occasionally has Tea at night but his nocturia is every night so is not related to his Tea    PMH: Past Medical History  Diagnosis Date  . Coronary artery disease   . Hypertension   . Hyperlipidemia   . Sleep apnea   . Myocardial infarction ([place])   . Seizures ([place])   . COPD (chronic obstructive pulmonary disease) ([place])   . Hepatitis   . Barrett's esophagus with dysplasia   . 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 Diabetes mellitus without complication [facility])     Surgical History: Past Surgical History  Procedure Laterality Date  . Cardiac catheterization      with Angioplasty  . Cardiac valve replacement      Aortic Valve Replacement  . Tonsillectomy    . Thoracotomy Right   . Foot amputation    . Colonoscopy with propofol N/A 03/18/2015    Procedure: COLONOSCOPY WITH PROPOFOL;  Surgeon: [name], MD;  Location: [facility] ENDOSCOPY;  Service: Endoscopy;  Laterality: N/A;  . Esophagogastroduodenoscopy N/A 03/18/2015    Procedure: ESOPHAGOGASTRODUODENOSCOPY (EGD);  Surgeon: [name], MD;  Location: [facility] ENDOSCOPY;  Service: Endoscopy;  Laterality: N/A;    Home Medications:    Medication List       This list is accurate as of: 07/17/15   9:34 AM.  Always use your most recent med list.               amLODipine-benazepril 5-10 MG capsule  Commonly known as:  LOTREL  Take 1 capsule by mouth daily.     aspirin 325 MG EC tablet  Take 325 mg by mouth daily.     co-enzyme Q-10 30 MG capsule  Take 100 mg by mouth daily.     INVOKAMET 150-500 MG Tabs  Generic drug:  Canagliflozin-Metformin HCl  TAKE 1 TABLET BY MOUTH TWICE A DAY     metoprolol succinate 100 MG 24 hr tablet  Commonly known as:  TOPROL-XL  Take 100 mg by mouth daily. Take with or immediately following a meal.     montelukast 10 MG tablet  Commonly known as:  SINGULAIR  Take 10 mg by mouth at bedtime.     naftifine 1 % cream  Commonly known as:  NAFTIN  Apply topically daily.     omeprazole 40 MG capsule  Commonly known as:  PRILOSEC  Take 40 mg by mouth daily.     rosuvastatin 20 MG tablet  Commonly known as:  CRESTOR  Take 20 mg by mouth daily.     sildenafil 100 MG tablet  Commonly known as:  VIAGRA  Take 100 mg by mouth daily as needed for erectile dysfunction.     tamsulosin 0.4 MG Caps capsule  Commonly known  as:  FLOMAX  Take 1 capsule (0.4 mg total) by mouth daily.     VICTOZA 18 MG/3ML Sopn  Generic drug:  Liraglutide  Inject 1.2 mg into the skin daily.        Allergies:  Allergies  Allergen Reactions  . Zyban [Bupropion]   . Zocor [Simvastatin]   . Iodinated Diagnostic Agents Rash    had a red chest -unusre of what kind of dye it was    Family History: Family History  Problem Relation Age of Onset  . Obesity Son   . Diabetes Brother   . Hypertension Brother     Social History:  reports that he has quit smoking. He has never used smokeless tobacco. He reports that he drinks alcohol. He reports that he does not use illicit drugs.  ROS: UROLOGY Frequent Urination?: No Hard to postpone urination?: No Burning/pain with urination?: Yes Get up at night to urinate?: Yes Leakage of urine?: No Urine stream starts  and stops?: Yes Trouble starting stream?: No Do you have to strain to urinate?: No Blood in urine?: No Urinary tract infection?: No Sexually transmitted disease?: No Injury to kidneys or bladder?: No Painful intercourse?: No Weak stream?: No Erection problems?: No Penile pain?: No  Gastrointestinal Nausea?: No Vomiting?: No Indigestion/heartburn?: No Diarrhea?: No Constipation?: No  Constitutional Fever: No Night sweats?: No Weight loss?: No Fatigue?: No  Skin Skin rash/lesions?: No Itching?: No  Eyes Blurred vision?: No Double vision?: No  Ears/Nose/Throat Sore throat?: No Sinus problems?: No  Hematologic/Lymphatic Swollen glands?: No Easy bruising?: No  Cardiovascular Leg swelling?: No Chest pain?: No  Respiratory Cough?: No Shortness of breath?: No  Endocrine Excessive thirst?: No  Musculoskeletal Back pain?: No Joint pain?: No  Neurological Headaches?: No Dizziness?: No  Psychologic Depression?: No Anxiety?: No  Physical Exam: BP 153/81 mmHg  Pulse 60  Ht 5\' 10"  (1.778 m)  Wt 268 lb 11.2 oz (121.882 kg)  BMI 38.55 kg/m2  Constitutional:  Alert and oriented, No acute distress. HEENT: [place] AT, moist mucus membranes.  Trachea midline, no masses. Cardiovascular: No clubbing, cyanosis, or edema. Respiratory: Normal respiratory effort, no increased work of breathing. GI: Abdomen is soft, nontender, distended, no abdominal masses is a large panniculus GU: No CVA tenderness. Testes descended and normal penis circumcised and normal Skin: No rashes, bruises or suspicious lesions. Lymph: No cervical or inguinal adenopathy. Neurologic: Grossly intact, no focal deficits, moving all 4 extremities. Psychiatric: Normal mood and affect.  Laboratory Data: Lab Results  Component Value Date   WBC 7.0 10/20/2014   HGB 14.5 10/20/2014   HCT 42 10/20/2014   PLT 203 10/20/2014    Lab Results  Component Value Date   CREATININE 1.0 10/20/2014     No results found for: PSA  No results found for: TESTOSTERONE  Lab Results  Component Value Date   HGBA1C 6.6 04/22/2015    Urinalysis    Component Value Date/Time   GLUCOSEU 3+* 04/14/2015 1142   BILIRUBINUR Negative 04/14/2015 1142   NITRITE Negative 04/14/2015 1142   LEUKOCYTESUR Negative 04/14/2015 1142    Pertinent Imaging: PVR 15 mL  Assessment & Plan:  Grossly overweight diabetic patient with previous incomplete emptying of his bladder that has resolved utilizing tamsulosin. He is working to lose weight and get his diabetes under control. He does have a complaint of nocturia with urgency 3 times a night. He would like something for this. Today his postvoid residual was minimal. So was urgency at night  is to be addressed with Vesicare 5 mg daily and will see him in one month to see if this is resolved his nocturia  1. Urinary retention postvoid residual today was only 15 so the patient is emptying his bladder well with tamsulosin. He still has urgency at night awakens at 3 times and would like something for this. - Urinalysis, Complete - BLADDER SCAN AMB NON-IMAGING   No Follow-up on file.  Collier Flowers, [facility] [address], [place], [place] [address] [phone]

## 2015-08-11 ENCOUNTER — Telehealth: Payer: Self-pay | Admitting: Family Medicine

## 2015-08-11 NOTE — Telephone Encounter (Signed)
Pt wife [name] called stating pt is in a doughnut hole with insurance and is paying for all medication.  Pt wife is asking if we have samples of INVOKAMET 150-500 MG TABS or can pt get another Rx that will be cheaper.  [facility].  CB#[phone]

## 2015-08-11 NOTE — Telephone Encounter (Signed)
Dr. Darnell Level We have no Invokamet samples.

## 2015-08-17 ENCOUNTER — Encounter: Payer: Self-pay | Admitting: Urology

## 2015-08-17 ENCOUNTER — Ambulatory Visit (INDEPENDENT_AMBULATORY_CARE_PROVIDER_SITE_OTHER): Payer: PPO | Admitting: Urology

## 2015-08-17 VITALS — BP 145/69 | HR 67 | Ht 70.0 in | Wt 268.9 lb

## 2015-08-17 DIAGNOSIS — N3281 Overactive bladder: Secondary | ICD-10-CM

## 2015-08-17 DIAGNOSIS — R351 Nocturia: Secondary | ICD-10-CM | POA: Diagnosis not present

## 2015-08-17 LAB — URINALYSIS, COMPLETE
BILIRUBIN UA: NEGATIVE
KETONES UA: NEGATIVE
LEUKOCYTES UA: NEGATIVE
Nitrite, UA: NEGATIVE
PROTEIN UA: NEGATIVE
Urobilinogen, Ur: 0.2 mg/dL (ref 0.2–1.0)
pH, UA: 5 (ref 5.0–7.5)

## 2015-08-17 LAB — MICROSCOPIC EXAMINATION
Bacteria, UA: NONE SEEN
Epithelial Cells (non renal): NONE SEEN /hpf (ref 0–10)
RBC MICROSCOPIC, UA: NONE SEEN /HPF (ref 0–?)

## 2015-08-17 LAB — BLADDER SCAN AMB NON-IMAGING

## 2015-08-17 MED ORDER — SOLIFENACIN SUCCINATE 5 MG PO TABS: 5.0000 mg | ORAL_TABLET | Freq: Every day | ORAL | Status: DC

## 2015-08-17 NOTE — Progress Notes (Signed)
08/17/2015 8:59 AM   [name] 1946/08/21 [id number]  Referring provider: [name]., MD [address] [place], [place] [address]  Chief Complaint  Patient presents with  . Over Active Bladder    21month    HPI: Mr. Bartram has a past history of retention and is currently on Flomax treated by Dr. Elnoria Howard. He started him on physical care samples to see if it would improve his nighttime frequency 4  Flow moderate and stable. Frequency 2-3 times a night with improvement  Review of systems no change in bowel or neurologic status   PMH: Past Medical History  Diagnosis Date  . Coronary artery disease   . Hypertension   . Hyperlipidemia   . Sleep apnea   . Myocardial infarction ([place])   . Seizures ([place])   . COPD (chronic obstructive pulmonary disease) ([place])   . Hepatitis   . Barrett's esophagus with dysplasia   . 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 Diabetes mellitus without complication [facility])     Surgical History: Past Surgical History  Procedure Laterality Date  . Cardiac catheterization      with Angioplasty  . Cardiac valve replacement      Aortic Valve Replacement  . Tonsillectomy    . Thoracotomy Right   . Foot amputation    . Colonoscopy with propofol N/A 03/18/2015    Procedure: COLONOSCOPY WITH PROPOFOL;  Surgeon: [name], MD;  Location: [facility] ENDOSCOPY;  Service: Endoscopy;  Laterality: N/A;  . Esophagogastroduodenoscopy N/A 03/18/2015    Procedure: ESOPHAGOGASTRODUODENOSCOPY (EGD);  Surgeon: [name], MD;  Location: [facility] ENDOSCOPY;  Service: Endoscopy;  Laterality: N/A;    Home Medications:    Medication List       This list is accurate as of: 08/17/15  8:59 AM.  Always use your most recent med list.               amLODipine-benazepril 5-10 MG capsule  Commonly known as:  LOTREL  Take 1 capsule by mouth daily.     aspirin 325 MG EC tablet  Take 325 mg by mouth daily.     co-enzyme Q-10  30 MG capsule  Take 100 mg by mouth daily.     INVOKAMET 150-500 MG Tabs  Generic drug:  Canagliflozin-Metformin HCl  TAKE 1 TABLET BY MOUTH TWICE A DAY     metoprolol succinate 100 MG 24 hr tablet  Commonly known as:  TOPROL-XL  Take 100 mg by mouth daily. Take with or immediately following a meal.     montelukast 10 MG tablet  Commonly known as:  SINGULAIR  Take 10 mg by mouth at bedtime.     naftifine 1 % cream  Commonly known as:  NAFTIN  Apply topically daily.     omeprazole 40 MG capsule  Commonly known as:  PRILOSEC  Take 40 mg by mouth daily.     rosuvastatin 20 MG tablet  Commonly known as:  CRESTOR  Take 20 mg by mouth daily.     sildenafil 100 MG tablet  Commonly known as:  VIAGRA  Take 100 mg by mouth daily as needed for erectile dysfunction.     solifenacin 5 MG tablet  Commonly known as:  VESICARE  Take 5 mg by mouth daily.     tamsulosin 0.4 MG Caps capsule  Commonly known as:  FLOMAX  Take 1 capsule (0.4 mg total) by mouth daily.  VICTOZA 18 MG/3ML Sopn  Generic drug:  Liraglutide  Inject 1.2 mg into the skin daily.        Allergies:  Allergies  Allergen Reactions  . Zyban [Bupropion]   . Zocor [Simvastatin]   . Iodinated Diagnostic Agents Rash    had a red chest -unusre of what kind of dye it was    Family History: Family History  Problem Relation Age of Onset  . Obesity Son   . Diabetes Brother   . Hypertension Brother     Social History:  reports that he has quit smoking. He has never used smokeless tobacco. He reports that he drinks alcohol. He reports that he does not use illicit drugs.  ROS: UROLOGY Frequent Urination?: No Hard to postpone urination?: Yes Burning/pain with urination?: No Get up at night to urinate?: Yes Leakage of urine?: No Urine stream starts and stops?: Yes Trouble starting stream?: Yes Do you have to strain to urinate?: No Blood in urine?: No Urinary tract infection?: No Sexually transmitted  disease?: No Injury to kidneys or bladder?: No Painful intercourse?: No Weak stream?: No Erection problems?: No Penile pain?: No  Gastrointestinal Nausea?: No Vomiting?: No Indigestion/heartburn?: No Diarrhea?: No Constipation?: No  Constitutional Fever: No Night sweats?: No Weight loss?: No Fatigue?: No  Skin Skin rash/lesions?: No Itching?: No  Eyes Blurred vision?: No Double vision?: No  Ears/Nose/Throat Sore throat?: No Sinus problems?: No  Hematologic/Lymphatic Swollen glands?: No Easy bruising?: No  Cardiovascular Leg swelling?: No Chest pain?: No  Respiratory Cough?: No Shortness of breath?: No  Endocrine Excessive thirst?: No  Musculoskeletal Back pain?: No Joint pain?: No  Neurological Headaches?: No Dizziness?: No  Psychologic Depression?: No Anxiety?: No  Physical Exam: BP 145/69 mmHg  Pulse 67  Ht 5\' 10"  (1.778 m)  Wt 268 lb 14.4 oz (121.972 kg)  BMI 38.58 kg/m2    Laboratory Data: Lab Results  Component Value Date   WBC 7.0 10/20/2014   HGB 14.5 10/20/2014   HCT 42 10/20/2014   PLT 203 10/20/2014    Lab Results  Component Value Date   CREATININE 1.0 10/20/2014    No results found for: PSA  No results found for: TESTOSTERONE  Lab Results  Component Value Date   HGBA1C 6.6 04/22/2015    Urinalysis    Component Value Date/Time   GLUCOSEU 2+* 07/17/2015 0903   BILIRUBINUR Negative 07/17/2015 0903   NITRITE Negative 07/17/2015 0903   LEUKOCYTESUR Negative 07/17/2015 0903    Pertinent Imaging: Post void residual was 46 mL  Assessment & Plan:  I would like to keep Mr. Cleek on a combination of an alpha blocker and Vesicare  I will see him in 6 months with a flow and residual and then annually. Samples of Vesicare prescription given.  1. OAB (overactive bladder) 2. Nocturia - Urinalysis, Complete - BLADDER SCAN AMB NON-IMAGING    Reece Packer, MD  [facility] [address], [place], [place] [address] [phone]

## 2015-08-17 NOTE — Addendum Note (Signed)
Addended by: Toniann Fail C on: 08/17/2015 09:02 AM   Modules accepted: Orders

## 2015-08-23 ENCOUNTER — Other Ambulatory Visit: Payer: Self-pay | Admitting: Family Medicine

## 2015-08-24 ENCOUNTER — Encounter: Payer: Self-pay | Admitting: Family Medicine

## 2015-08-24 ENCOUNTER — Ambulatory Visit (INDEPENDENT_AMBULATORY_CARE_PROVIDER_SITE_OTHER): Payer: PPO | Admitting: Family Medicine

## 2015-08-24 VITALS — BP 142/64 | HR 72 | Temp 98.5°F | Resp 16 | Wt 273.0 lb

## 2015-08-24 DIAGNOSIS — E669 Obesity, unspecified: Secondary | ICD-10-CM

## 2015-08-24 DIAGNOSIS — I2510 Atherosclerotic heart disease of native coronary artery without angina pectoris: Secondary | ICD-10-CM | POA: Diagnosis not present

## 2015-08-24 DIAGNOSIS — J3089 Other allergic rhinitis: Secondary | ICD-10-CM

## 2015-08-24 DIAGNOSIS — J00 Acute nasopharyngitis [common cold]: Secondary | ICD-10-CM

## 2015-08-24 DIAGNOSIS — E118 Type 2 diabetes mellitus with unspecified complications: Secondary | ICD-10-CM | POA: Diagnosis not present

## 2015-08-24 DIAGNOSIS — G4733 Obstructive sleep apnea (adult) (pediatric): Secondary | ICD-10-CM

## 2015-08-24 DIAGNOSIS — I10 Essential (primary) hypertension: Secondary | ICD-10-CM

## 2015-08-24 DIAGNOSIS — E785 Hyperlipidemia, unspecified: Secondary | ICD-10-CM

## 2015-08-24 DIAGNOSIS — IMO0001 Reserved for inherently not codable concepts without codable children: Secondary | ICD-10-CM

## 2015-08-24 LAB — POCT GLYCOSYLATED HEMOGLOBIN (HGB A1C): Hemoglobin A1C: 6.6

## 2015-08-24 LAB — POCT UA - MICROALBUMIN: MICROALBUMIN (UR) POC: 20 mg/L

## 2015-08-24 MED ORDER — AMLODIPINE BESY-BENAZEPRIL HCL 5-40 MG PO CAPS: 1.0000 | ORAL_CAPSULE | Freq: Every day | ORAL | Status: DC

## 2015-08-24 MED ORDER — AZITHROMYCIN 250 MG PO TABS: ORAL_TABLET | ORAL | Status: DC

## 2015-08-24 NOTE — Progress Notes (Signed)
Patient ID: Andrew Lyons, male   DOB: 1946/09/15, 69 y.o.   MRN: [id number]    Subjective:  HPI  Diabetes follow up: Patient is here for 4 months follow up. He does not check his sugars at home. Tolerating medication well. Lab Results  Component Value Date   HGBA1C 6.6 04/22/2015   Hyperlipidemia follow up: Patient does take Crestor. His last level check and all labs were in January 2016.  BP Readings from Last 3 Encounters:  08/24/15 142/64  08/17/15 145/69  07/17/15 153/81   Sleep apnea: Patient does wear CPAP every night and is tolerating it well.  Obesity: Wt Readings from Last 3 Encounters:  08/24/15 273 lb (123.832 kg)  08/17/15 268 lb 14.4 oz (121.972 kg)  07/17/15 268 lb 11.2 oz (121.882 kg)  Weight has increased slightly.   Cold: Patient had a cold that started 2 weeks ago. HE now has right earache, scratchy throat on the right side, nasal congestion, sinus pressure. No fever or chills, no body aches. He has tried OTC medication like NyQuil and cough drops. He is using Fluticasone.   Prior to Admission medications   Medication Sig Start Date End Date Taking? Authorizing Provider  amLODipine-benazepril (LOTREL) 5-10 MG per capsule Take 1 capsule by mouth daily.   Yes Historical Provider, MD  aspirin 325 MG EC tablet Take 325 mg by mouth daily.   Yes Historical Provider, MD  co-enzyme Q-10 30 MG capsule Take 100 mg by mouth daily.   Yes Historical Provider, MD  INVOKAMET 150-500 MG TABS TAKE 1 TABLET BY MOUTH TWICE A DAY 06/22/15  Yes Da Michelle Maceo Pro., MD  Liraglutide (VICTOZA) 18 MG/3ML SOPN Inject 1.2 mg into the skin daily.   Yes Historical Provider, MD  metoprolol succinate (TOPROL-XL) 100 MG 24 hr tablet Take 100 mg by mouth daily. Take with or immediately following a meal.   Yes Historical Provider, MD  montelukast (SINGULAIR) 10 MG tablet Take 10 mg by mouth at bedtime.   Yes Historical Provider, MD  naftifine (NAFTIN) 1 % cream Apply topically daily.    Yes Historical Provider, MD  omeprazole (PRILOSEC) 40 MG capsule Take 40 mg by mouth daily. 11/03/14  Yes Historical Provider, MD  rosuvastatin (CRESTOR) 20 MG tablet Take 20 mg by mouth daily.   Yes Historical Provider, MD  sildenafil (VIAGRA) 100 MG tablet Take 100 mg by mouth daily as needed for erectile dysfunction.   Yes Historical Provider, MD  solifenacin (VESICARE) 5 MG tablet Take 1 tablet (5 mg total) by mouth daily. 08/17/15  Yes Bjorn Loser, MD  tamsulosin (FLOMAX) 0.4 MG CAPS capsule Take 1 capsule (0.4 mg total) by mouth daily. 04/14/15  Yes Collier Flowers, MD    Patient Active Problem List   Diagnosis Date Noted  . OAB (overactive bladder) 08/17/2015  . Allergic rhinitis 03/31/2015  . Arthritis 03/31/2015  . Benign fibroma of prostate 03/31/2015  . Atherosclerosis of coronary artery 03/31/2015  . Diabetes ([address]) 03/31/2015  . ED (erectile dysfunction) of organic origin 03/31/2015  . Acid reflux 03/31/2015  . HLD (hyperlipidemia) 03/31/2015  . BP (high blood pressure) 03/31/2015  . Malaise and fatigue 03/31/2015  . Combined fat and carbohydrate induced hyperlipemia 03/31/2015  . Adiposity 03/31/2015  . Obstructive apnea 03/31/2015  . Barrett esophagus 02/18/2015  . H/O adenomatous polyp of colon 02/18/2015  . Benign essential HTN 02/12/2015  . Aortic valve, bicuspid 01/15/2014  . Arteriosclerosis of coronary artery 01/15/2014  . H/O aortic  valve replacement 01/15/2014    Past Medical History  Diagnosis Date  . Coronary artery disease   . Hypertension   . Hyperlipidemia   . Sleep apnea   . Myocardial infarction ([place])   . Seizures ([place])   . COPD (chronic obstructive pulmonary disease) ([place])   . Hepatitis   . Barrett's esophagus with dysplasia   . 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 Diabetes mellitus without complication [facility])     Social History   Social History  . Marital Status: Married    Spouse Name: N/A  . Number of Children: N/A  .  Years of Education: N/A   Occupational History  . Not on file.   Social History Main Topics  . Smoking status: Former Research scientist (life sciences)  . Smokeless tobacco: Never Used     Comment: Quit smoking in 2003; Started smoking at age 28, smoked about 40 years, smoked over 3 packs per day  . Alcohol Use: Yes     Comment: Occcasional alcohol use  . Drug Use: No  . Sexual Activity: Not on file   Other Topics Concern  . Not on file   Social History Narrative    Allergies  Allergen Reactions  . Zyban [Bupropion]   . Zocor [Simvastatin]   . Iodinated Diagnostic Agents Rash    had a red chest -unusre of what kind of dye it was    Review of Systems  Constitutional: Negative.   HENT: Positive for congestion, ear pain and sore throat.   Respiratory: Positive for cough.   Cardiovascular: Negative.   Gastrointestinal: Negative.   Musculoskeletal: Negative.   Neurological: Negative.   Endo/Heme/Allergies: Bruises/bleeds easily.  Psychiatric/Behavioral: Negative.     Immunization History  Administered Date(s) Administered  . Pneumococcal Conjugate-13 08/14/2014  . Pneumococcal Polysaccharide-23 08/02/2007, 10/22/2012  . Tdap 06/09/2010  . Zoster 10/22/2012   Objective:  BP 142/64 mmHg  Pulse 72  Temp(Src) 98.5 F (36.9 C)  Resp 16  Wt 273 lb (123.832 kg)  Physical Exam  Constitutional: He is oriented to person, place, and time and well-developed, well-nourished, and in no distress.  HENT:  Right Ear: External ear normal.  Left Ear: External ear normal.  Nose: Nose normal.  Mouth/Throat: Oropharynx is clear and moist.  Eyes: Conjunctivae are normal. Pupils are equal, round, and reactive to light.  Neck: Normal range of motion. Neck supple.  Cardiovascular: Normal rate, regular rhythm, normal heart sounds and intact distal pulses.   No murmur heard. Pulmonary/Chest: Effort normal and breath sounds normal. No respiratory distress. He has no wheezes.  Abdominal: Soft.    Musculoskeletal: Normal range of motion. He exhibits no edema or tenderness.  Neurological: He is alert and oriented to person, place, and time.  Skin: Skin is warm and dry.  Psychiatric: Mood, memory, affect and judgment normal.    Lab Results  Component Value Date   WBC 7.0 10/20/2014   HGB 14.5 10/20/2014   HCT 42 10/20/2014   PLT 203 10/20/2014   HGBA1C 6.6 04/22/2015    CMP     Component Value Date/Time   NA 140 10/20/2014   K 4.4 10/20/2014   BUN 18 10/20/2014   CREATININE 1.0 10/20/2014    Assessment and Plan :  1. Benign essential HTN Will increase Lotrel to 5-40 mg. - amLODipine-benazepril (LOTREL) 5-40 MG capsule; Take 1 capsule by mouth daily.  Dispense: 30 capsule; Refill: 12 - CBC with Differential/Platelet - Comprehensive metabolic panel - TSH  2. Atherosclerosis of native coronary artery of native heart without angina pectoris  3. Obstructive apnea Uses CPAP nightly.  4. Other allergic rhinitis  5. HLD (hyperlipidemia) - Lipid Panel With LDL/HDL Ratio - Comprehensive metabolic panel - TSH  6. Adiposity - TSH  7. Type 2 diabetes mellitus with complication, without long-term current use of insulin (HCC) A1C 6.6 same. - amLODipine-benazepril (LOTREL) 5-40 MG capsule; Take 1 capsule by mouth daily.  Dispense: 30 capsule; Refill: 12 - POCT HgB A1C - POCT UA - Microalbumin  8. Cold Viral most likely vs allergies. Will provide Zpak just in case. Advised patient to get flu shot about 2 weeks from now. - azithromycin (ZITHROMAX Z-PAK) 250 MG tablet; As directed  Dispense: 6 each; Refill: 0   Patient was seen and examined by Dr. Eulas Post and note was scribed by Theressa Millard, RMA.   Miguel Aschoff MD [facility] 08/24/2015 8:41 AM

## 2015-08-26 LAB — CBC WITH DIFFERENTIAL/PLATELET
BASOS: 1 %
Basophils Absolute: 0 10*3/uL (ref 0.0–0.2)
EOS (ABSOLUTE): 0.4 10*3/uL (ref 0.0–0.4)
EOS: 7 %
HEMATOCRIT: 40.6 % (ref 37.5–51.0)
HEMOGLOBIN: 14.2 g/dL (ref 12.6–17.7)
Immature Grans (Abs): 0 10*3/uL (ref 0.0–0.1)
Immature Granulocytes: 0 %
LYMPHS ABS: 1.4 10*3/uL (ref 0.7–3.1)
Lymphs: 25 %
MCH: 31.3 pg (ref 26.6–33.0)
MCHC: 35 g/dL (ref 31.5–35.7)
MCV: 90 fL (ref 79–97)
MONOCYTES: 9 %
MONOS ABS: 0.5 10*3/uL (ref 0.1–0.9)
NEUTROS ABS: 3.4 10*3/uL (ref 1.4–7.0)
Neutrophils: 58 %
Platelets: 157 10*3/uL (ref 150–379)
RBC: 4.53 x10E6/uL (ref 4.14–5.80)
RDW: 14.8 % (ref 12.3–15.4)
WBC: 5.7 10*3/uL (ref 3.4–10.8)

## 2015-08-26 LAB — TSH: TSH: 1.98 u[IU]/mL (ref 0.450–4.500)

## 2015-08-26 LAB — COMPREHENSIVE METABOLIC PANEL
A/G RATIO: 2.2 (ref 1.1–2.5)
ALT: 16 IU/L (ref 0–44)
AST: 21 IU/L (ref 0–40)
Albumin: 4.4 g/dL (ref 3.6–4.8)
Alkaline Phosphatase: 96 IU/L (ref 39–117)
BUN/Creatinine Ratio: 17 (ref 10–22)
BUN: 19 mg/dL (ref 8–27)
Bilirubin Total: 1 mg/dL (ref 0.0–1.2)
CALCIUM: 9.7 mg/dL (ref 8.6–10.2)
CHLORIDE: 99 mmol/L (ref 97–106)
CO2: 25 mmol/L (ref 18–29)
Creatinine, Ser: 1.1 mg/dL (ref 0.76–1.27)
GFR, EST AFRICAN AMERICAN: 79 mL/min/{1.73_m2} (ref >59)
GFR, EST NON AFRICAN AMERICAN: 69 mL/min/{1.73_m2} (ref >59)
GLUCOSE: 182 mg/dL — AB (ref 65–99)
Globulin, Total: 2 g/dL (ref 1.5–4.5)
POTASSIUM: 4.7 mmol/L (ref 3.5–5.2)
Sodium: 139 mmol/L (ref 136–144)
TOTAL PROTEIN: 6.4 g/dL (ref 6.0–8.5)

## 2015-08-26 LAB — LIPID PANEL WITH LDL/HDL RATIO
Cholesterol, Total: 132 mg/dL (ref 100–199)
HDL: 30 mg/dL — ABNORMAL LOW (ref >39)
LDL CALC: 64 mg/dL (ref 0–99)
LDL/HDL RATIO: 2.1 ratio (ref 0.0–3.6)
Triglycerides: 190 mg/dL — ABNORMAL HIGH (ref 0–149)
VLDL Cholesterol Cal: 38 mg/dL (ref 5–40)

## 2015-09-11 ENCOUNTER — Ambulatory Visit (INDEPENDENT_AMBULATORY_CARE_PROVIDER_SITE_OTHER): Payer: PPO

## 2015-09-11 DIAGNOSIS — Z23 Encounter for immunization: Secondary | ICD-10-CM

## 2015-09-17 ENCOUNTER — Other Ambulatory Visit: Payer: Self-pay

## 2015-09-17 DIAGNOSIS — I10 Essential (primary) hypertension: Secondary | ICD-10-CM

## 2015-09-17 DIAGNOSIS — E118 Type 2 diabetes mellitus with unspecified complications: Secondary | ICD-10-CM

## 2015-09-17 MED ORDER — FLUTICASONE PROPIONATE 50 MCG/ACT NA SUSP: 2.0000 | Freq: Every day | NASAL | Status: DC

## 2015-09-17 MED ORDER — AMLODIPINE BESY-BENAZEPRIL HCL 5-40 MG PO CAPS: 1.0000 | ORAL_CAPSULE | Freq: Every day | ORAL | Status: DC

## 2015-09-21 ENCOUNTER — Other Ambulatory Visit: Payer: Self-pay | Admitting: Urology

## 2015-09-21 DIAGNOSIS — N3281 Overactive bladder: Secondary | ICD-10-CM

## 2015-09-22 ENCOUNTER — Other Ambulatory Visit: Payer: Self-pay

## 2015-09-22 DIAGNOSIS — N3281 Overactive bladder: Secondary | ICD-10-CM

## 2015-09-22 MED ORDER — SOLIFENACIN SUCCINATE 5 MG PO TABS: 5.0000 mg | ORAL_TABLET | Freq: Every day | ORAL | Status: DC

## 2015-09-22 NOTE — Progress Notes (Signed)
Pt requested a 90 supply be sent to pharmacy.

## 2015-09-22 NOTE — Telephone Encounter (Signed)
Patient's wife called and left a voice mail message on 12/26 requesting a 90-day supply for 5mg  Vesicare.  Please call into the [facility] on [address].

## 2015-10-26 ENCOUNTER — Ambulatory Visit (INDEPENDENT_AMBULATORY_CARE_PROVIDER_SITE_OTHER): Payer: PPO | Admitting: Family Medicine

## 2015-10-26 ENCOUNTER — Encounter: Payer: Self-pay | Admitting: Family Medicine

## 2015-10-26 VITALS — BP 138/72 | HR 74 | Temp 97.8°F | Resp 16 | Wt 270.0 lb

## 2015-10-26 DIAGNOSIS — S88919A Complete traumatic amputation of unspecified lower leg, level unspecified, initial encounter: Secondary | ICD-10-CM | POA: Insufficient documentation

## 2015-10-26 DIAGNOSIS — L259 Unspecified contact dermatitis, unspecified cause: Secondary | ICD-10-CM

## 2015-10-26 DIAGNOSIS — S88911S Complete traumatic amputation of right lower leg, level unspecified, sequela: Secondary | ICD-10-CM

## 2015-10-26 DIAGNOSIS — I10 Essential (primary) hypertension: Secondary | ICD-10-CM

## 2015-10-26 DIAGNOSIS — E118 Type 2 diabetes mellitus with unspecified complications: Secondary | ICD-10-CM | POA: Diagnosis not present
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
## 2015-11-03 ENCOUNTER — Other Ambulatory Visit: Payer: Self-pay | Admitting: Family Medicine

## 2015-11-13 ENCOUNTER — Other Ambulatory Visit: Payer: Self-pay | Admitting: Family Medicine

## 2015-11-22 ENCOUNTER — Other Ambulatory Visit: Payer: Self-pay | Admitting: Family Medicine

## 2015-12-18 ENCOUNTER — Other Ambulatory Visit: Payer: Self-pay | Admitting: Family Medicine

## 2015-12-19 ENCOUNTER — Other Ambulatory Visit: Payer: Self-pay | Admitting: Family Medicine

## 2015-12-21 ENCOUNTER — Other Ambulatory Visit: Payer: Self-pay | Admitting: Family Medicine

## 2016-01-11 ENCOUNTER — Other Ambulatory Visit: Payer: Self-pay | Admitting: Family Medicine

## 2016-01-12 ENCOUNTER — Ambulatory Visit (INDEPENDENT_AMBULATORY_CARE_PROVIDER_SITE_OTHER): Payer: PPO | Admitting: Family Medicine

## 2016-01-12 VITALS — BP 138/62 | HR 74 | Temp 97.7°F | Resp 16 | Wt 272.0 lb

## 2016-01-12 DIAGNOSIS — I10 Essential (primary) hypertension: Secondary | ICD-10-CM

## 2016-01-12 DIAGNOSIS — E118 Type 2 diabetes mellitus with unspecified complications: Secondary | ICD-10-CM

## 2016-01-12 DIAGNOSIS — D582 Other hemoglobinopathies: Secondary | ICD-10-CM

## 2016-01-12 NOTE — Progress Notes (Signed)
Patient ID: Andrew Lyons, male   DOB: May 30, 1946, 70 y.o.   MRN: [id number]   Andrew Lyons  MRN: [id number] DOB: 1945-11-11  Subjective:  HPI   1. Type 2 diabetes mellitus with complication, without long-term current use of insulin [facility]) The patient is a 70 year old male who presents for follow up of his diabetes.  His last Diabetic visit was on 08/24/15.  His A1C on that day was 6.6.  He is no longer checking his glucose at home.  2. Benign essential HTN He is also here to follow up on his blood pressure after we increased his Lotrel from 5/10 to 5/40.  He has not checked his BP outside of the office.  He reports no adverse effects from the increase in dose.   Patient Active Problem List   Diagnosis Date Noted  . Traumatic lower limb amputation ([place]) 10/26/2015  . OAB (overactive bladder) 08/17/2015  . Allergic rhinitis 03/31/2015  . Arthritis 03/31/2015  . Benign fibroma of prostate 03/31/2015  . Atherosclerosis of coronary artery 03/31/2015  . Diabetes ([place]) 03/31/2015  . ED (erectile dysfunction) of organic origin 03/31/2015  . Acid reflux 03/31/2015  . HLD (hyperlipidemia) 03/31/2015  . BP (high blood pressure) 03/31/2015  . Malaise and fatigue 03/31/2015  . Combined fat and carbohydrate induced hyperlipemia 03/31/2015  . Adiposity 03/31/2015  . Obstructive apnea 03/31/2015  . Barrett esophagus 02/18/2015  . H/O adenomatous polyp of colon 02/18/2015  . Benign essential HTN 02/12/2015  . Aortic valve, bicuspid 01/15/2014  . Arteriosclerosis of coronary artery 01/15/2014  . H/O aortic valve replacement 01/15/2014    Past Medical History  Diagnosis Date  . Coronary artery disease   . Hypertension   . Hyperlipidemia   . Sleep apnea   . Myocardial infarction ([place])   . Seizures ([place])   . COPD (chronic obstructive pulmonary disease) ([place])   . Hepatitis   . Barrett's esophagus with dysplasia   . 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 Diabetes  mellitus without complication [facility])     Social History   Social History  . Marital Status: Married    Spouse Name: N/A  . Number of Children: N/A  . Years of Education: N/A   Occupational History  . Not on file.   Social History Main Topics  . Smoking status: Former Research scientist (life sciences)  . Smokeless tobacco: Never Used     Comment: Quit smoking in 2003; Started smoking at age 42, smoked about 40 years, smoked over 3 packs per day  . Alcohol Use: Yes     Comment: Occcasional alcohol use  . Drug Use: No  . Sexual Activity: Not on file   Other Topics Concern  . Not on file   Social History Narrative    Outpatient Prescriptions Prior to Visit  Medication Sig Dispense Refill  . amLODipine-benazepril (LOTREL) 5-40 MG capsule Take 1 capsule by mouth daily. 90 capsule 3  . aspirin 325 MG EC tablet Take 325 mg by mouth daily.    . B-D UF III MINI PEN NEEDLES 31G X 5 MM MISC USE WITH PEN DAILY 100 each 3  . co-enzyme Q-10 30 MG capsule Take 100 mg by mouth daily.    . fluticasone (FLONASE) 50 MCG/ACT nasal spray Place 2 sprays into both nostrils daily. 48 g 3  . INVOKAMET 150-500 MG TABS TAKE 1 TABLET BY MOUTH TWICE A DAY 90 tablet 2  . metoprolol succinate (TOPROL-XL) 100 MG 24  hr tablet TAKE 1 TABLET BY MOUTH EVERY DAY 90 tablet 3  . montelukast (SINGULAIR) 10 MG tablet TAKE 1 TABLET BY MOUTH EVERY DAY 90 tablet 3  . omeprazole (PRILOSEC) 40 MG capsule TAKE ONE CAPSULE BY MOUTH EVERY DAY 90 capsule 3  . rosuvastatin (CRESTOR) 20 MG tablet Take 20 mg by mouth daily.    . sildenafil (VIAGRA) 100 MG tablet Take 20 mg by mouth daily as needed for erectile dysfunction.     . solifenacin (VESICARE) 5 MG tablet Take 1 tablet (5 mg total) by mouth daily. 90 tablet 3  . tamsulosin (FLOMAX) 0.4 MG CAPS capsule TAKE 1 CAPSULE (0.4 MG TOTAL) BY MOUTH DAILY. 90 capsule 3  . VICTOZA 18 MG/3ML SOPN INJECT 0.6 SUBCUTANEOUSLY EVERY DAY 9 pen 12  . azithromycin (ZITHROMAX Z-PAK) 250 MG tablet As directed 6 each  0  . naftifine (NAFTIN) 1 % cream Apply topically daily.     No facility-administered medications prior to visit.    Allergies  Allergen Reactions  . Zyban [Bupropion]   . Zocor [Simvastatin]   . Iodinated Diagnostic Agents Rash    had a red chest -unusre of what kind of dye it was    Review of Systems  Constitutional: Negative for fever and malaise/fatigue.  Respiratory: Negative for cough, shortness of breath and wheezing.   Cardiovascular: Negative for chest pain, palpitations, orthopnea and leg swelling.  Neurological: Negative for dizziness, weakness and headaches.  Endo/Heme/Allergies: Negative for polydipsia.   Objective:  BP 138/62 mmHg  Pulse 74  Temp(Src) 97.7 F (36.5 C) (Oral)  Resp 16  Wt 272 lb (123.378 kg)  Physical Exam  Constitutional: He is oriented to person, place, and time and well-developed, well-nourished, and in no distress.  HENT:  Head: Normocephalic.  Eyes: Pupils are equal, round, and reactive to light.  Neck: Normal range of motion.  Cardiovascular: Normal rate, regular rhythm and normal heart sounds.   Pulmonary/Chest: Effort normal and breath sounds normal.  Neurological: He is alert and oriented to person, place, and time.  Walking with some limp secondary to prosthesis  Skin: Skin is warm and dry.  Psychiatric: Mood, memory, affect and judgment normal.    Assessment and Plan :  1. Type 2 diabetes mellitus with complication, without long-term current use of insulin (HCC)  - Hemoglobin A1c  2. Benign essential HTN   3. Elevated hemoglobin (HCC) - CBC With Differential/Platelet 4. CAD All risk factors treated. I have done the exam and reviewed the above chart and it is accurate to the best of my knowledge.    Miguel Aschoff MD [facility] 01/12/2016 8:21 AM

## 2016-01-14 LAB — CBC WITH DIFFERENTIAL/PLATELET
BASOS ABS: 0 10*3/uL (ref 0.0–0.2)
Basos: 1 %
EOS (ABSOLUTE): 0.3 10*3/uL (ref 0.0–0.4)
Eos: 5 %
Hematocrit: 44.9 % (ref 37.5–51.0)
Hemoglobin: 14.1 g/dL (ref 12.6–17.7)
Immature Grans (Abs): 0.1 10*3/uL (ref 0.0–0.1)
Immature Granulocytes: 1 %
LYMPHS ABS: 1.2 10*3/uL (ref 0.7–3.1)
Lymphs: 21 %
MCH: 30.2 pg (ref 26.6–33.0)
MCHC: 31.4 g/dL — AB (ref 31.5–35.7)
MCV: 96 fL (ref 79–97)
MONOCYTES: 8 %
MONOS ABS: 0.5 10*3/uL (ref 0.1–0.9)
NEUTROS ABS: 3.7 10*3/uL (ref 1.4–7.0)
Neutrophils: 64 %
PLATELETS: 178 10*3/uL (ref 150–379)
RBC: 4.67 x10E6/uL (ref 4.14–5.80)
RDW: 16 % — AB (ref 12.3–15.4)
WBC: 5.8 10*3/uL (ref 3.4–10.8)

## 2016-01-14 LAB — HEMOGLOBIN A1C
ESTIMATED AVERAGE GLUCOSE: 160 mg/dL
HEMOGLOBIN A1C: 7.2 % — AB (ref 4.8–5.6)

## 2016-01-14 LAB — SPECIMEN STATUS REPORT

## 2016-01-26 DIAGNOSIS — S88111S Complete traumatic amputation at level between knee and ankle, right lower leg, sequela: Secondary | ICD-10-CM | POA: Diagnosis not present

## 2016-02-19 ENCOUNTER — Ambulatory Visit: Payer: PPO | Admitting: Urology

## 2016-02-19 ENCOUNTER — Encounter: Payer: Self-pay | Admitting: Urology

## 2016-02-25 DIAGNOSIS — H2511 Age-related nuclear cataract, right eye: Secondary | ICD-10-CM | POA: Diagnosis not present

## 2016-02-26 ENCOUNTER — Encounter: Payer: Self-pay | Admitting: Family Medicine

## 2016-03-03 ENCOUNTER — Other Ambulatory Visit: Payer: Self-pay

## 2016-03-14 DIAGNOSIS — G4733 Obstructive sleep apnea (adult) (pediatric): Secondary | ICD-10-CM | POA: Diagnosis not present

## 2016-03-14 DIAGNOSIS — I10 Essential (primary) hypertension: Secondary | ICD-10-CM | POA: Diagnosis not present

## 2016-03-14 DIAGNOSIS — Q231 Congenital insufficiency of aortic valve: Secondary | ICD-10-CM | POA: Diagnosis not present

## 2016-03-14 DIAGNOSIS — I2510 Atherosclerotic heart disease of native coronary artery without angina pectoris: Secondary | ICD-10-CM | POA: Diagnosis not present

## 2016-03-14 DIAGNOSIS — Z952 Presence of prosthetic heart valve: Secondary | ICD-10-CM | POA: Diagnosis not present

## 2016-03-14 DIAGNOSIS — E782 Mixed hyperlipidemia: Secondary | ICD-10-CM | POA: Diagnosis not present

## 2016-03-22 ENCOUNTER — Other Ambulatory Visit: Payer: Self-pay | Admitting: Family Medicine

## 2016-04-11 ENCOUNTER — Ambulatory Visit (INDEPENDENT_AMBULATORY_CARE_PROVIDER_SITE_OTHER): Payer: PPO | Admitting: Urology

## 2016-04-11 ENCOUNTER — Encounter: Payer: Self-pay | Admitting: Urology

## 2016-04-11 VITALS — BP 129/73 | HR 67 | Ht 70.0 in | Wt 275.1 lb

## 2016-04-11 DIAGNOSIS — N3281 Overactive bladder: Secondary | ICD-10-CM

## 2016-04-11 MED ORDER — MIRABEGRON ER 50 MG PO TB24: 50.0000 mg | ORAL_TABLET | Freq: Every day | ORAL | Status: DC

## 2016-04-11 MED ORDER — TAMSULOSIN HCL 0.4 MG PO CAPS: 0.4000 mg | ORAL_CAPSULE | Freq: Every day | ORAL | Status: DC

## 2016-04-11 NOTE — Progress Notes (Signed)
04/11/2016 9:18 AM   Andrew Lyons October 27, 1945 [id number]  Referring provider: [name]., MD [address] [place], [place] [address]  Chief Complaint  Patient presents with  . Follow-up    HPI: The patient is stable on Flomax. Flow is stable. Nighttime frequency 3 is stable and not improved on Vesicare  Clinically noninfected       PMH: Past Medical History  Diagnosis Date  . Coronary artery disease   . Hypertension   . Hyperlipidemia   . Sleep apnea   . Myocardial infarction ([place])   . Seizures ([place])   . COPD (chronic obstructive pulmonary disease) ([place])   . Hepatitis   . Barrett's esophagus with dysplasia   . 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 Diabetes mellitus without complication [facility])     Surgical History: Past Surgical History  Procedure Laterality Date  . Cardiac catheterization      with Angioplasty  . Cardiac valve replacement      Aortic Valve Replacement  . Tonsillectomy    . Thoracotomy Right   . Foot amputation    . Colonoscopy with propofol N/A 03/18/2015    Procedure: COLONOSCOPY WITH PROPOFOL;  Surgeon: [name], MD;  Location[facility] END[facility]COPY;  Service: Endoscopy;  Laterality: N/A;  . Esophagogastroduodenoscopy N/A 03/18/2015    Procedure: ESOPHAGOGASTRODUODENOSCOPY (EGD);  Surgeon: [name], MD;  [facility] ENDOSCOPY;  Service: Endoscopy;  Laterality: N/A;    Home Medications:    Medication List       This list is accurate as of: 04/11/16  9:18 AM.  Always use your most recent med list.               amLODipine-benazepril 5-40 MG capsule  Commonly known as:  LOTREL  Take 1 capsule by mouth daily.     aspirin 325 MG EC tablet  Take 325 mg by mouth daily.     B-D UF III MINI PEN NEEDLES 31G X 5 MM Misc  Generic drug:  Insulin Pen Needle  USE WITH PEN DAILY     co-enzyme Q-10 30 MG capsule  Take 100 mg by mouth daily.     fluticasone 50 MCG/ACT nasal spray    Commonly known as:  FLONASE  Place 2 sprays into both nostrils daily.     INVOKAMET 150-500 MG Tabs  Generic drug:  Canagliflozin-Metformin HCl  TAKE 1 TABLET BY MOUTH TWICE A DAY     metoprolol succinate 100 MG 24 hr tablet  Commonly known as:  TOPROL-XL  TAKE 1 TABLET BY MOUTH EVERY DAY     mirabegron ER 50 MG Tb24 tablet  Commonly known as:  MYRBETRIQ  Take 1 tablet (50 mg total) by mouth daily.     montelukast 10 MG tablet  Commonly known as:  SINGULAIR  TAKE 1 TABLET BY MOUTH EVERY DAY     omeprazole 40 MG capsule  Commonly known as:  PRILOSEC  TAKE ONE CAPSULE BY MOUTH EVERY DAY     rosuvastatin 20 MG tablet  Commonly known as:  CRESTOR  TAKE 1 TABLET BY MOUTH EVERY DAY     sildenafil 100 MG tablet  Commonly known as:  VIAGRA  Take 20 mg by mouth daily as needed for erectile dysfunction. Reported on 04/11/2016     solifenacin 5 MG tablet  Commonly known as:  VESICARE  Take 1 tablet (5 mg total) by mouth daily.     tamsulosin  0.4 MG Caps capsule  Commonly known as:  FLOMAX  TAKE 1 CAPSULE (0.4 MG TOTAL) BY MOUTH DAILY.     tamsulosin 0.4 MG Caps capsule  Commonly known as:  FLOMAX  Take 1 capsule (0.4 mg total) by mouth daily.     VICTOZA 18 MG/3ML Sopn  Generic drug:  Liraglutide  INJECT 0.6 SUBCUTANEOUSLY EVERY DAY        Allergies:  Allergies  Allergen Reactions  . Iodinated Diagnostic Agents Rash    had a red chest -unusre of what kind of dye it was  . Zyban [Bupropion]   . Zocor [Simvastatin]     Family History: Family History  Problem Relation Age of Onset  . Obesity Son   . Diabetes Brother   . Hypertension Brother   . Heart attack Mother   . Heart attack Father     Social History:  reports that he has quit smoking. He has never used smokeless tobacco. He reports that he drinks alcohol. He reports that he does not use illicit drugs.  ROS: UROLOGY Frequent Urination?: No Hard to postpone urination?: Yes Burning/pain with  urination?: No Get up at night to urinate?: Yes Leakage of urine?: No Urine stream starts and stops?: Yes Trouble starting stream?: Yes Do you have to strain to urinate?: No Blood in urine?: No Urinary tract infection?: No Sexually transmitted disease?: No Injury to kidneys or bladder?: No Painful intercourse?: No Weak stream?: No Erection problems?: No Penile pain?: No  Gastrointestinal Nausea?: No Vomiting?: No Indigestion/heartburn?: No Diarrhea?: No Constipation?: No  Constitutional Fever: No Night sweats?: No Weight loss?: No Fatigue?: No  Skin Skin rash/lesions?: No Itching?: No  Eyes Blurred vision?: No Double vision?: No  Ears/Nose/Throat Sore throat?: No Sinus problems?: No  Hematologic/Lymphatic Swollen glands?: No Easy bruising?: No  Cardiovascular Leg swelling?: No Chest pain?: No  Respiratory Cough?: No Shortness of breath?: No  Endocrine Excessive thirst?: No  Musculoskeletal Back pain?: No Joint pain?: No  Neurological Headaches?: No Dizziness?: No  Psychologic Depression?: No Anxiety?: No  Physical Exam: BP 129/73 mmHg  Pulse 67  Ht 5\' 10"  (1.778 m)  Wt 275 lb 1.6 oz (124.785 kg)  BMI 39.47 kg/m2    Laboratory Data: Lab Results  Component Value Date   WBC 5.8 01/12/2016   HGB 14.5 10/20/2014   HCT 44.9 01/12/2016   MCV 96 01/12/2016   PLT 178 01/12/2016    Lab Results  Component Value Date   CREATININE 1.10 08/25/2015    No results found for: PSA  No results found for: TESTOSTERONE  Lab Results  Component Value Date   HGBA1C 7.2* 01/12/2016    Urinalysis    Component Value Date/Time   APPEARANCEUR Clear 08/17/2015 0834   GLUCOSEU 3+* 08/17/2015 0834   BILIRUBINUR Negative 08/17/2015 0834   PROTEINUR Negative 08/17/2015 0834   NITRITE Negative 08/17/2015 0834   LEUKOCYTESUR Negative 08/17/2015 0834    Pertinent Imaging: None  Assessment & Plan:  The patient has stable nighttime frequency. I  gave him the beta 3 agonist 50 mg of samples and prescription. He will space seen in a year either on combination therapy of Flomax monotherapy. Flomax renewed  1. OAB (overactive bladder) . noctuira   No Follow-up on file.  Reece Packer, MD  [facility] [address], [place], [place] [address] [phone]

## 2016-05-03 NOTE — Telephone Encounter (Signed)
error 

## 2016-05-16 ENCOUNTER — Encounter: Payer: Self-pay | Admitting: Family Medicine

## 2016-05-16 ENCOUNTER — Ambulatory Visit (INDEPENDENT_AMBULATORY_CARE_PROVIDER_SITE_OTHER): Payer: PPO | Admitting: Family Medicine

## 2016-05-16 DIAGNOSIS — E118 Type 2 diabetes mellitus with unspecified complications: Secondary | ICD-10-CM

## 2016-05-16 DIAGNOSIS — I2510 Atherosclerotic heart disease of native coronary artery without angina pectoris: Secondary | ICD-10-CM | POA: Diagnosis not present

## 2016-05-16 DIAGNOSIS — Z0000 Encounter for general adult medical examination without abnormal findings: Secondary | ICD-10-CM | POA: Diagnosis not present

## 2016-05-16 DIAGNOSIS — I10 Essential (primary) hypertension: Secondary | ICD-10-CM

## 2016-05-16 DIAGNOSIS — E785 Hyperlipidemia, unspecified: Secondary | ICD-10-CM | POA: Diagnosis not present

## 2016-05-16 NOTE — Progress Notes (Signed)
Patient: Andrew Lyons, Male    DOB: 1945/12/16, 70 y.o.   MRN: [id number] Visit Date: 05/16/2016  Today's Provider: [name], MD   Chief Complaint  Patient presents with  . Medicare Wellness   Subjective:   Andrew Lyons is a 70 y.o. male who presents today for his Subsequent Annual Wellness Visit. He feels well. He reports he is not exercising. He reports he is sleeping poorly. He has to get up every hour or so to go to the bathroom. He gets no exercise but he does have a strong desire to lose weight. Overall he feels pretty well. Please see review of systems. Colonoscopy- 03/18/15 Hyperplastic polyps  Immunization History  Administered Date(s) Administered  . Influenza,inj,Quad PF,36+ Mos 09/11/2015  . Pneumococcal Conjugate-13 08/14/2014  . Pneumococcal Polysaccharide-23 08/02/2007, 10/22/2012  . Tdap 06/09/2010  . Zoster 10/22/2012     Review of Systems  Constitutional: Negative.   HENT: Positive for hearing loss and rhinorrhea.   Eyes: Positive for discharge and itching.  Respiratory: Negative.   Cardiovascular: Negative.   Gastrointestinal: Negative.   Endocrine: Negative.   Genitourinary: Negative.   Musculoskeletal: Negative.   Skin: Negative.   Allergic/Immunologic: Negative.   Neurological: Negative.   Hematological: Negative.   Psychiatric/Behavioral: Negative.     Patient Active Problem List   Diagnosis Date Noted  . Traumatic lower limb amputation ([place]) 10/26/2015  . OAB (overactive bladder) 08/17/2015  . Allergic rhinitis 03/31/2015  . Arthritis 03/31/2015  . Benign fibroma of prostate 03/31/2015  . Atherosclerosis of coronary artery 03/31/2015  . Diabetes ([place]) 03/31/2015  . ED (erectile dysfunction) of organic origin 03/31/2015  . Acid reflux 03/31/2015  . HLD (hyperlipidemia) 03/31/2015  . BP (high blood pressure) 03/31/2015  . Malaise and fatigue 03/31/2015  . Combined fat and carbohydrate induced hyperlipemia 03/31/2015  .  Adiposity 03/31/2015  . Obstructive apnea 03/31/2015  . Barrett esophagus 02/18/2015  . H/O adenomatous polyp of colon 02/18/2015  . Benign essential HTN 02/12/2015  . Aortic valve, bicuspid 01/15/2014  . Arteriosclerosis of coronary artery 01/15/2014  . H/O aortic valve replacement 01/15/2014    Social History   Social History  . Marital status: Married    Spouse name: N/A  . Number of children: N/A  . Years of education: N/A   Occupational History  . Not on file.   Social History Main Topics  . Smoking status: Former Research scientist (life sciences)  . Smokeless tobacco: Never Used     Comment: Quit smoking in 2003; Started smoking at age 18, smoked about 40 years, smoked over 3 packs per day  . Alcohol use Yes     Comment: Occcasional alcohol use  . Drug use: No  . Sexual activity: Not on file   Other Topics Concern  . Not on file   Social History Narrative  . No narrative on file    Past Surgical History:  Procedure Laterality Date  . CARDIAC CATHETERIZATION     with Angioplasty  . CARDIAC VALVE REPLACEMENT     Aortic Valve Replacement  . COLONOSCOPY WITH PROPOFOL N/A 03/18/2015   Procedure: COLONOSCOPY WITH PROPOFOL;  Surgeon: [name], MD;  Location[facility] ENDOSCOPY;  Service: Endoscopy;  Laterality: N/A;  . ESOPHAGOGASTRODUODENOSCOPY N/A 03/18/2015   Procedure: ESOPHAGOGASTRODUODENOSCOPY (EGD);  Surgeon: [name], MD;  Location: [facility] ENDOSCOPY;  Service: Endoscopy;  Laterality: N/A;  . FOOT AMPUTATION    . THORACOTOMY Right   . TONSILLECTOMY      His  family history includes Diabetes in his brother; Heart attack in his father and mother; Hypertension in his brother; Obesity in his son.    Outpatient Medications Prior to Visit  Medication Sig Dispense Refill  . amLODipine-benazepril (LOTREL) 5-40 MG capsule Take 1 capsule by mouth daily. 90 capsule 3  . aspirin 325 MG EC tablet Take 325 mg by mouth daily.    . B-D UF III MINI PEN NEEDLES 31G X 5 MM MISC USE WITH PEN  DAILY 100 each 3  . co-enzyme Q-10 30 MG capsule Take 100 mg by mouth daily.    . fluticasone (FLONASE) 50 MCG/ACT nasal spray Place 2 sprays into both nostrils daily. 48 g 3  . INVOKAMET 150-500 MG TABS TAKE 1 TABLET BY MOUTH TWICE A DAY 90 tablet 2  . metoprolol succinate (TOPROL-XL) 100 MG 24 hr tablet TAKE 1 TABLET BY MOUTH EVERY DAY 90 tablet 3  . mirabegron ER (MYRBETRIQ) 50 MG TB24 tablet Take 1 tablet (50 mg total) by mouth daily. 30 tablet 11  . montelukast (SINGULAIR) 10 MG tablet TAKE 1 TABLET BY MOUTH EVERY DAY 90 tablet 3  . omeprazole (PRILOSEC) 40 MG capsule TAKE ONE CAPSULE BY MOUTH EVERY DAY 90 capsule 3  . rosuvastatin (CRESTOR) 20 MG tablet TAKE 1 TABLET BY MOUTH EVERY DAY 90 tablet 3  . sildenafil (VIAGRA) 100 MG tablet Take 20 mg by mouth daily as needed for erectile dysfunction. Reported on 04/11/2016    . solifenacin (VESICARE) 5 MG tablet Take 1 tablet (5 mg total) by mouth daily. 90 tablet 3  . tamsulosin (FLOMAX) 0.4 MG CAPS capsule TAKE 1 CAPSULE (0.4 MG TOTAL) BY MOUTH DAILY. 90 capsule 3  . tamsulosin (FLOMAX) 0.4 MG CAPS capsule Take 1 capsule (0.4 mg total) by mouth daily. 30 capsule 11  . VICTOZA 18 MG/3ML SOPN INJECT 0.6 SUBCUTANEOUSLY EVERY DAY 9 pen 12   No facility-administered medications prior to visit.     Allergies  Allergen Reactions  . Iodinated Diagnostic Agents Rash    had a red chest -unusre of what kind of dye it was  . Zyban [Bupropion]   . Zocor [Simvastatin]     Patient Care Team: [name]., MD as PCP - General (Family Medicine)  Objective:   Vitals: There were no vitals filed for this visit.  Physical Exam  Constitutional: He is oriented to person, place, and time. He appears well-developed and well-nourished.  HENT:  Head: Normocephalic and atraumatic.  Right Ear: External ear normal.  Left Ear: External ear normal.  Nose: Nose normal.  Mouth/Throat: Oropharynx is clear and moist.  Upper and lower dentures are in  place.  Eyes: Conjunctivae and EOM are normal. Pupils are equal, round, and reactive to light.  Neck: Normal range of motion. Neck supple.  Cardiovascular: Normal rate, regular rhythm, normal heart sounds and intact distal pulses.   Pulmonary/Chest: Effort normal and breath sounds normal.  Abdominal: Soft. Bowel sounds are normal.  Genitourinary: Rectum normal, prostate normal and penis normal.  Genitourinary Comments: External hemorrhoids are present. Patient states that he saw blood in the toilet paper today. Hemorrhoids are not thrombosed. No obvious bleeding on  visual exam.  Musculoskeletal: Normal range of motion.  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 He has normal reflexes.  Skin: Skin is warm and dry.  Psychiatric: He has a normal mood and affect. His behavior is normal. Judgment and thought  content normal.    Activities of Daily Living In your present state of health, do you have any difficulty performing the following activities: 05/16/2016 08/24/2015  Hearing? Y N  Vision? N N  Difficulty concentrating or making decisions? N N  Walking or climbing stairs? N N  Dressing or bathing? N N  Doing errands, shopping? N N  Some recent data might be hidden    Fall Risk Assessment Fall Risk  05/16/2016 08/24/2015  Falls in the past year? No No     Depression Screen PHQ 2/9 Scores 05/16/2016 08/24/2015  PHQ - 2 Score 0 0    Cognitive Testing - 6-CIT    Year: 0 points  Month: 0 points  Memorize "Pia Mau, [address], [place]"  Time (within 1 hour:) 3 points  Count backwards from 20:0 points  Name months of year: 0  points  Repeat Address: 4 points   Total Score: 4/28  Interpretation : Normal (0-7) Abnormal (8-28)    Assessment & Plan:     Annual Wellness Visit  Reviewed patient's Family Medical History Reviewed and updated list of patient's medical providers Assessment of cognitive impairment was  done Assessed patient's functional ability Established a written schedule for health screening [place] Completed and Reviewed  Exercise Activities and Dietary recommendations Goals    None      Immunization History  Administered Date(s) Administered  . Influenza,inj,Quad PF,36+ Mos 09/11/2015  . Pneumococcal Conjugate-13 08/14/2014  . Pneumococcal Polysaccharide-23 08/02/2007, 10/22/2012  . Tdap 06/09/2010  . Zoster 10/22/2012    Health Maintenance  Topic Date Due  . Hepatitis C Screening  12-10-1945  . FOOT EXAM  03/04/1957  . OPHTHALMOLOGY EXAM  03/04/1957  . INFLUENZA VACCINE  04/26/2016  . HEMOGLOBIN A1C  07/13/2016  . TETANUS/TDAP  06/09/2020  . COLONOSCOPY  03/17/2025  . ZOSTAVAX  Completed  . PNA vac Low Risk Adult  Completed   1. Medicare annual wellness visit, subsequent   2. Benign essential HTN  - CBC with Differential/Platelet - TSH  3. Type 2 diabetes mellitus with complication, without long-term current use of insulin (HCC)  - Hemoglobin A1c  4. HLD (hyperlipidemia)  - Lipid Panel With LDL/HDL Ratio - Comprehensive metabolic panel  5. Atherosclerosis of native coronary artery of native heart without angina pectoris All risk factors treated. 6. Obesity Dietary changes discussed and stressed with patient.    Discussed health benefits of physical activity, and encouraged him to engage in regular exercise appropriate for his age and condition.    Miguel Aschoff MD [facility] 05/16/2016 9:11 AM  ------------------------------------------------------------------------------------------------------------

## 2016-05-17 DIAGNOSIS — E785 Hyperlipidemia, unspecified: Secondary | ICD-10-CM | POA: Diagnosis not present

## 2016-05-17 DIAGNOSIS — E118 Type 2 diabetes mellitus with unspecified complications: Secondary | ICD-10-CM | POA: Diagnosis not present

## 2016-05-17 DIAGNOSIS — I10 Essential (primary) hypertension: Secondary | ICD-10-CM | POA: Diagnosis not present

## 2016-05-18 LAB — COMPREHENSIVE METABOLIC PANEL
ALT: 18 IU/L (ref 0–44)
AST: 27 IU/L (ref 0–40)
Albumin/Globulin Ratio: 2.1 (ref 1.2–2.2)
Albumin: 4.7 g/dL (ref 3.6–4.8)
Alkaline Phosphatase: 84 IU/L (ref 39–117)
BILIRUBIN TOTAL: 0.9 mg/dL (ref 0.0–1.2)
BUN/Creatinine Ratio: 21 (ref 10–24)
BUN: 26 mg/dL (ref 8–27)
CALCIUM: 9.7 mg/dL (ref 8.6–10.2)
CHLORIDE: 98 mmol/L (ref 96–106)
CO2: 21 mmol/L (ref 18–29)
Creatinine, Ser: 1.26 mg/dL (ref 0.76–1.27)
GFR, EST AFRICAN AMERICAN: 67 mL/min/{1.73_m2} (ref >59)
GFR, EST NON AFRICAN AMERICAN: 58 mL/min/{1.73_m2} — AB (ref >59)
GLOBULIN, TOTAL: 2.2 g/dL (ref 1.5–4.5)
Glucose: 188 mg/dL — ABNORMAL HIGH (ref 65–99)
POTASSIUM: 5 mmol/L (ref 3.5–5.2)
SODIUM: 139 mmol/L (ref 134–144)
TOTAL PROTEIN: 6.9 g/dL (ref 6.0–8.5)

## 2016-05-18 LAB — LIPID PANEL WITH LDL/HDL RATIO
Cholesterol, Total: 144 mg/dL (ref 100–199)
HDL: 34 mg/dL — ABNORMAL LOW (ref >39)
LDL CALC: 63 mg/dL (ref 0–99)
LDL/HDL RATIO: 1.9 ratio (ref 0.0–3.6)
Triglycerides: 237 mg/dL — ABNORMAL HIGH (ref 0–149)
VLDL Cholesterol Cal: 47 mg/dL — ABNORMAL HIGH (ref 5–40)

## 2016-05-18 LAB — CBC WITH DIFFERENTIAL/PLATELET
BASOS ABS: 0 10*3/uL (ref 0.0–0.2)
Basos: 1 %
EOS (ABSOLUTE): 0.3 10*3/uL (ref 0.0–0.4)
Eos: 5 %
HEMATOCRIT: 41.9 % (ref 37.5–51.0)
HEMOGLOBIN: 14.3 g/dL (ref 12.6–17.7)
Immature Grans (Abs): 0 10*3/uL (ref 0.0–0.1)
Immature Granulocytes: 0 %
LYMPHS ABS: 1.5 10*3/uL (ref 0.7–3.1)
Lymphs: 24 %
MCH: 31.4 pg (ref 26.6–33.0)
MCHC: 34.1 g/dL (ref 31.5–35.7)
MCV: 92 fL (ref 79–97)
MONOCYTES: 10 %
MONOS ABS: 0.6 10*3/uL (ref 0.1–0.9)
NEUTROS ABS: 3.7 10*3/uL (ref 1.4–7.0)
NEUTROS PCT: 60 %
Platelets: 184 10*3/uL (ref 150–379)
RBC: 4.56 x10E6/uL (ref 4.14–5.80)
RDW: 15.3 % (ref 12.3–15.4)
WBC: 6.1 10*3/uL (ref 3.4–10.8)

## 2016-05-18 LAB — HEMOGLOBIN A1C
ESTIMATED AVERAGE GLUCOSE: 163 mg/dL
Hgb A1c MFr Bld: 7.3 % — ABNORMAL HIGH (ref 4.8–5.6)

## 2016-05-18 LAB — TSH: TSH: 2.36 u[IU]/mL (ref 0.450–4.500)

## 2016-05-25 DIAGNOSIS — M1712 Unilateral primary osteoarthritis, left knee: Secondary | ICD-10-CM | POA: Diagnosis not present

## 2016-07-18 DIAGNOSIS — M1712 Unilateral primary osteoarthritis, left knee: Secondary | ICD-10-CM | POA: Diagnosis not present

## 2016-08-05 ENCOUNTER — Telehealth: Payer: Self-pay | Admitting: Family Medicine

## 2016-08-05 ENCOUNTER — Ambulatory Visit (INDEPENDENT_AMBULATORY_CARE_PROVIDER_SITE_OTHER): Payer: PPO | Admitting: Family Medicine

## 2016-08-05 DIAGNOSIS — Z23 Encounter for immunization: Secondary | ICD-10-CM

## 2016-08-05 NOTE — Telephone Encounter (Signed)
Sample of Victoza 1 pen given and pt advised-aa

## 2016-08-05 NOTE — Telephone Encounter (Signed)
Pt's wife Andrew Lyons stated pt is in the doughnut hole and would like to get enough samples of VICTOZA 18 MG/3ML SOPN to last until the end of the year. Please advise. Thanks TNP

## 2016-08-22 DIAGNOSIS — G4733 Obstructive sleep apnea (adult) (pediatric): Secondary | ICD-10-CM | POA: Diagnosis not present

## 2016-08-22 DIAGNOSIS — I10 Essential (primary) hypertension: Secondary | ICD-10-CM | POA: Diagnosis not present

## 2016-08-22 DIAGNOSIS — Q231 Congenital insufficiency of aortic valve: Secondary | ICD-10-CM | POA: Diagnosis not present

## 2016-08-22 DIAGNOSIS — I2510 Atherosclerotic heart disease of native coronary artery without angina pectoris: Secondary | ICD-10-CM | POA: Diagnosis not present

## 2016-08-22 DIAGNOSIS — Z952 Presence of prosthetic heart valve: Secondary | ICD-10-CM | POA: Diagnosis not present

## 2016-09-08 ENCOUNTER — Telehealth: Payer: Self-pay | Admitting: Family Medicine

## 2016-09-08 NOTE — Telephone Encounter (Signed)
Patients wife was advised. KW 

## 2016-09-08 NOTE — Telephone Encounter (Signed)
Pt's wife stated pt is in the doughnut hole for VICTOZA 18 MG/3ML SOPN and would like to pick up a sample pen to get him through the rest of the year. Please advise. Thanks TNP

## 2016-09-08 NOTE — Telephone Encounter (Signed)
[place] with me if we have any.

## 2016-09-08 NOTE — Telephone Encounter (Signed)
Sample are available okay to leave sample for pick up? KW

## 2016-09-12 DIAGNOSIS — D229 Melanocytic nevi, unspecified: Secondary | ICD-10-CM | POA: Diagnosis not present

## 2016-09-12 DIAGNOSIS — L821 Other seborrheic keratosis: Secondary | ICD-10-CM | POA: Diagnosis not present

## 2016-09-12 DIAGNOSIS — L219 Seborrheic dermatitis, unspecified: Secondary | ICD-10-CM | POA: Diagnosis not present

## 2016-09-12 DIAGNOSIS — L918 Other hypertrophic disorders of the skin: Secondary | ICD-10-CM | POA: Diagnosis not present

## 2016-09-12 DIAGNOSIS — D485 Neoplasm of uncertain behavior of skin: Secondary | ICD-10-CM | POA: Diagnosis not present

## 2016-09-12 DIAGNOSIS — Z1283 Encounter for screening for malignant neoplasm of skin: Secondary | ICD-10-CM | POA: Diagnosis not present

## 2016-09-12 DIAGNOSIS — Z79899 Other long term (current) drug therapy: Secondary | ICD-10-CM | POA: Diagnosis not present

## 2016-09-12 DIAGNOSIS — L578 Other skin changes due to chronic exposure to nonionizing radiation: Secondary | ICD-10-CM | POA: Diagnosis not present

## 2016-09-12 DIAGNOSIS — L812 Freckles: Secondary | ICD-10-CM | POA: Diagnosis not present

## 2016-09-12 DIAGNOSIS — B359 Dermatophytosis, unspecified: Secondary | ICD-10-CM | POA: Diagnosis not present

## 2016-09-15 ENCOUNTER — Other Ambulatory Visit: Payer: Self-pay | Admitting: Family Medicine

## 2016-09-15 ENCOUNTER — Ambulatory Visit (INDEPENDENT_AMBULATORY_CARE_PROVIDER_SITE_OTHER): Payer: PPO | Admitting: Family Medicine

## 2016-09-15 VITALS — BP 132/78 | HR 68 | Temp 97.7°F | Resp 16 | Wt 272.0 lb

## 2016-09-15 DIAGNOSIS — E118 Type 2 diabetes mellitus with unspecified complications: Secondary | ICD-10-CM

## 2016-09-15 DIAGNOSIS — I10 Essential (primary) hypertension: Secondary | ICD-10-CM | POA: Diagnosis not present

## 2016-09-15 DIAGNOSIS — L259 Unspecified contact dermatitis, unspecified cause: Secondary | ICD-10-CM

## 2016-09-15 LAB — POCT GLYCOSYLATED HEMOGLOBIN (HGB A1C): Hemoglobin A1C: 7

## 2016-09-15 MED ORDER — METFORMIN HCL 500 MG PO TABS: 500.0000 mg | ORAL_TABLET | Freq: Two times a day (BID) | ORAL | 3 refills | Status: DC

## 2016-09-15 MED ORDER — CANAGLIFLOZIN 300 MG PO TABS: 300.0000 mg | ORAL_TABLET | Freq: Every day | ORAL | 3 refills | Status: DC

## 2016-09-15 MED ORDER — MOMETASONE FUROATE 0.1 % EX CREA: 1.0000 "application " | TOPICAL_CREAM | Freq: Every day | CUTANEOUS | 0 refills | Status: DC

## 2016-09-15 MED ORDER — AMLODIPINE BESY-BENAZEPRIL HCL 5-40 MG PO CAPS: 1.0000 | ORAL_CAPSULE | Freq: Every day | ORAL | 3 refills | Status: DC

## 2016-09-15 NOTE — Progress Notes (Signed)
Andrew Lyons  MRN: [id number] DOB: 02-Jan-1946  Subjective:  HPI   The patient is a 70 year male who presents for follow up of diabetes.  He was last seen on 05/16/16.  His A1C on that day was 7.3.  He is not checking his glucose at home.  He denies any hypoglycemic symptoms or events.   He recently saw dermatology and they recommended he take medication for his onychomycosis.  The patient declines taking it because he would have to stop his Crestor and the patient did not want to do that.  Patient Active Problem List   Diagnosis Date Noted  . Traumatic lower limb amputation ([place]) 10/26/2015  . OAB (overactive bladder) 08/17/2015  . Allergic rhinitis 03/31/2015  . Arthritis 03/31/2015  . Benign fibroma of prostate 03/31/2015  . Atherosclerosis of coronary artery 03/31/2015  . Diabetes ([place]) 03/31/2015  . ED (erectile dysfunction) of organic origin 03/31/2015  . Acid reflux 03/31/2015  . HLD (hyperlipidemia) 03/31/2015  . BP (high blood pressure) 03/31/2015  . Malaise and fatigue 03/31/2015  . Combined fat and carbohydrate induced hyperlipemia 03/31/2015  . Adiposity 03/31/2015  . Obstructive apnea 03/31/2015  . Barrett esophagus 02/18/2015  . H/O adenomatous polyp of colon 02/18/2015  . Benign essential HTN 02/12/2015  . Aortic valve, bicuspid 01/15/2014  . Arteriosclerosis of coronary artery 01/15/2014  . H/O aortic valve replacement 01/15/2014    Past Medical History:  Diagnosis Date  . 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 Barrett's esophagus with dysplasia   . COPD (chronic obstructive pulmonary disease) ([place])   . Coronary artery disease   . Diabetes mellitus without complication ([place])   . Hepatitis   . Hyperlipidemia   . Hypertension   . Myocardial infarction   . Seizures ([place])   . Sleep apnea     Social History   Social History  . Marital status: Married    Spouse name: N/A  . Number of children: N/A  . Years of education: N/A    Occupational History  . Not on file.   Social History Main Topics  . Smoking status: Former Research scientist (life sciences)  . Smokeless tobacco: Never Used     Comment: Quit smoking in 2003; Started smoking at age 59, smoked about 40 years, smoked over 3 packs per day  . Alcohol use Yes     Comment: Occcasional alcohol use  . Drug use: No  . Sexual activity: Not on file   Other Topics Concern  . Not on file   Social History Narrative  . No narrative on file    Outpatient Encounter Prescriptions as of 09/15/2016  Medication Sig  . amLODipine-benazepril (LOTREL) 5-40 MG capsule Take 1 capsule by mouth daily.  Marland Kitchen aspirin 325 MG EC tablet Take 325 mg by mouth daily.  . B-D UF III MINI PEN NEEDLES 31G X 5 MM MISC USE WITH PEN DAILY  . co-enzyme Q-10 30 MG capsule Take 100 mg by mouth daily.  . fluticasone (FLONASE) 50 MCG/ACT nasal spray Place 2 sprays into both nostrils daily.  . INVOKAMET 150-500 MG TABS TAKE 1 TABLET BY MOUTH TWICE A DAY  . metoprolol succinate (TOPROL-XL) 100 MG 24 hr tablet TAKE 1 TABLET BY MOUTH EVERY DAY  . montelukast (SINGULAIR) 10 MG tablet TAKE 1 TABLET BY MOUTH EVERY DAY  . omeprazole (PRILOSEC) 40 MG capsule TAKE ONE CAPSULE BY MOUTH EVERY DAY  . rosuvastatin (CRESTOR) 20 MG tablet TAKE 1 TABLET BY MOUTH  EVERY DAY  . sildenafil (REVATIO) 20 MG tablet Take 20 mg by mouth 3 (three) times daily.  . tamsulosin (FLOMAX) 0.4 MG CAPS capsule TAKE 1 CAPSULE (0.4 MG TOTAL) BY MOUTH DAILY.  . tamsulosin (FLOMAX) 0.4 MG CAPS capsule Take 1 capsule (0.4 mg total) by mouth daily.  Marland Kitchen VICTOZA 18 MG/3ML SOPN INJECT 0.6 SUBCUTANEOUSLY EVERY DAY  . [DISCONTINUED] mirabegron ER (MYRBETRIQ) 50 MG TB24 tablet Take 1 tablet (50 mg total) by mouth daily.  . [DISCONTINUED] sildenafil (VIAGRA) 100 MG tablet Take 20 mg by mouth daily as needed for erectile dysfunction. Reported on 04/11/2016  . [DISCONTINUED] solifenacin (VESICARE) 5 MG tablet Take 1 tablet (5 mg total) by mouth daily.   No  facility-administered encounter medications on file as of 09/15/2016.     Allergies  Allergen Reactions  . Iodinated Diagnostic Agents Rash    had a red chest -unusre of what kind of dye it was  . Zyban [Bupropion]   . Zocor [Simvastatin]     Review of Systems  Constitutional: Negative for fever and malaise/fatigue.  Respiratory: Negative for cough, shortness of breath and wheezing.   Cardiovascular: Negative for chest pain, palpitations, orthopnea, claudication, leg swelling and PND.  Genitourinary: Negative for frequency.  Neurological: Negative for dizziness, weakness and headaches.  Endo/Heme/Allergies: Negative for polydipsia.  Psychiatric/Behavioral: Negative.     Objective:  BP 132/78 (BP Location: Right Arm, Patient Position: Sitting, Cuff Size: Large)   Pulse 68   Temp 97.7 F (36.5 C) (Oral)   Resp 16   Wt 272 lb (123.4 kg)   BMI 39.03 kg/m   Physical Exam  Constitutional: He is oriented to person, place, and time and well-developed, well-nourished, and in no distress.  HENT:  Head: Normocephalic and atraumatic.  Eyes: Conjunctivae are normal. No scleral icterus.  Neck: No thyromegaly present.  Cardiovascular: Normal rate, regular rhythm and normal heart sounds.   Pulmonary/Chest: Effort normal and breath sounds normal.  Abdominal: Soft.  Neurological: He is alert and oriented to person, place, and time.  Skin: There is erythema (mild, right buttocks greater than left).  Psychiatric: Mood, memory, affect and judgment normal.    Assessment and Plan :   1. Contact dermatitis, unspecified contact dermatitis type, unspecified trigger May need derm referral. - mometasone (ELOCON) 0.1 % cream; Apply 1 application topically daily.  Dispense: 45 g; Refill: 0  2. Type 2 diabetes mellitus with complication, without long-term current use of insulin (HCC) RTC 4 months. - canagliflozin (INVOKANA) 300 MG TABS tablet; Take 1 tablet (300 mg total) by mouth daily before  breakfast.  Dispense: 90 tablet; Refill: 3 - metFORMIN (GLUCOPHAGE) 500 MG tablet; Take 1 tablet (500 mg total) by mouth 2 (two) times daily with a meal.  Dispense: 180 tablet; Refill: 3 - amLODipine-benazepril (LOTREL) 5-40 MG capsule; Take 1 capsule by mouth daily.  Dispense: 90 capsule; Refill: 3 - POCT glycosylated hemoglobin (Hb A1C)  3. Benign essential HTN  - amLODipine-benazepril (LOTREL) 5-40 MG capsule; Take 1 capsule by mouth daily.  Dispense: 90 capsule; Refill: 3 4.HLD 5.Obesity 6.OSA 7.OAB 8.CAD with EF >55% HPI, Exam and A&P Transcribed under the direction and in the presence of Miguel Aschoff, Brooke Bonito., MD. Electronically Signed: Althea Charon, RMA I have done the exam and reviewed the chart and it is accurate to the best of my knowledge. Development worker, community has been used and  any errors in dictation or transcription are unintentional. Miguel Aschoff M.D. [facility]

## 2016-09-15 NOTE — Patient Instructions (Addendum)
Patient is to use Eucerin cream on his hands for dryness.  Invokamet has been changed to Invokana 300 mg once daily and Metformin 500 mg twice daily

## 2016-10-17 ENCOUNTER — Telehealth: Payer: Self-pay | Admitting: Family Medicine

## 2016-10-17 NOTE — Telephone Encounter (Signed)
Patient has seen Dr [name] for this before and will call his office.  ED

## 2016-10-17 NOTE — Telephone Encounter (Signed)
He was diagnosed by you with contact dermatitis and given mometasone.  He states it isn't itching any more but has otherwise gotten worse.  Do you have something else for him to try or does he need derm referral? ED

## 2016-10-17 NOTE — Telephone Encounter (Signed)
Pt came in Dec regarding a rash onhis bottom.  He was prescribed a cream.  It has came back.  Please advise.  Ask for Elana.  Pt's call back is [phone]  Thanks Con Memos

## 2016-10-17 NOTE — Telephone Encounter (Signed)
Dermatology or see me early Wednesday afternoon

## 2016-10-20 DIAGNOSIS — Z79899 Other long term (current) drug therapy: Secondary | ICD-10-CM | POA: Diagnosis not present

## 2016-10-20 DIAGNOSIS — B359 Dermatophytosis, unspecified: Secondary | ICD-10-CM | POA: Diagnosis not present

## 2016-11-23 DIAGNOSIS — B359 Dermatophytosis, unspecified: Secondary | ICD-10-CM | POA: Diagnosis not present

## 2016-11-23 DIAGNOSIS — L603 Nail dystrophy: Secondary | ICD-10-CM | POA: Diagnosis not present

## 2016-11-23 DIAGNOSIS — L853 Xerosis cutis: Secondary | ICD-10-CM | POA: Diagnosis not present

## 2016-11-23 DIAGNOSIS — L97919 Non-pressure chronic ulcer of unspecified part of right lower leg with unspecified severity: Secondary | ICD-10-CM | POA: Diagnosis not present

## 2016-11-24 DIAGNOSIS — Z79899 Other long term (current) drug therapy: Secondary | ICD-10-CM | POA: Diagnosis not present

## 2016-11-24 DIAGNOSIS — B359 Dermatophytosis, unspecified: Secondary | ICD-10-CM | POA: Diagnosis not present

## 2016-11-29 ENCOUNTER — Other Ambulatory Visit: Payer: Self-pay | Admitting: Family Medicine

## 2016-12-12 ENCOUNTER — Other Ambulatory Visit: Payer: Self-pay | Admitting: Urology

## 2016-12-12 ENCOUNTER — Other Ambulatory Visit: Payer: Self-pay | Admitting: Family Medicine

## 2016-12-21 DIAGNOSIS — B359 Dermatophytosis, unspecified: Secondary | ICD-10-CM | POA: Diagnosis not present

## 2016-12-21 DIAGNOSIS — L97919 Non-pressure chronic ulcer of unspecified part of right lower leg with unspecified severity: Secondary | ICD-10-CM | POA: Diagnosis not present

## 2016-12-21 DIAGNOSIS — Z79899 Other long term (current) drug therapy: Secondary | ICD-10-CM | POA: Diagnosis not present

## 2016-12-29 ENCOUNTER — Ambulatory Visit (INDEPENDENT_AMBULATORY_CARE_PROVIDER_SITE_OTHER): Payer: PPO

## 2016-12-29 ENCOUNTER — Other Ambulatory Visit: Payer: Self-pay | Admitting: Family Medicine

## 2016-12-29 ENCOUNTER — Encounter: Payer: Self-pay | Admitting: Podiatry

## 2016-12-29 ENCOUNTER — Ambulatory Visit (INDEPENDENT_AMBULATORY_CARE_PROVIDER_SITE_OTHER): Payer: PPO | Admitting: Podiatry

## 2016-12-29 VITALS — BP 134/80 | HR 56

## 2016-12-29 DIAGNOSIS — R52 Pain, unspecified: Secondary | ICD-10-CM

## 2016-12-29 DIAGNOSIS — E11621 Type 2 diabetes mellitus with foot ulcer: Secondary | ICD-10-CM | POA: Diagnosis not present

## 2016-12-29 DIAGNOSIS — L97511 Non-pressure chronic ulcer of other part of right foot limited to breakdown of skin: Secondary | ICD-10-CM

## 2016-12-29 NOTE — Telephone Encounter (Signed)
Don't understand this prescription. Patient currently has prescription on chart for Invokana 300 mg daily and Metformin 500 mg BID. There meds are current and it looks like he had a years supply in December, so why is there a refill request for this combo drug?
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
## 2017-01-04 ENCOUNTER — Encounter: Payer: PPO | Attending: Internal Medicine | Admitting: Internal Medicine

## 2017-01-04 ENCOUNTER — Other Ambulatory Visit
Admission: RE | Admit: 2017-01-04 | Discharge: 2017-01-04 | Disposition: A | Discharge status: Home / Self Care | Payer: PPO | Source: Ambulatory Visit | Attending: Internal Medicine | Admitting: Internal Medicine

## 2017-01-04 DIAGNOSIS — Z6838 Body mass index (BMI) 38.0-38.9, adult: Secondary | ICD-10-CM | POA: Diagnosis not present

## 2017-01-04 DIAGNOSIS — Z794 Long term (current) use of insulin: Secondary | ICD-10-CM | POA: Insufficient documentation

## 2017-01-04 DIAGNOSIS — S88111D Complete traumatic amputation at level between knee and ankle, right lower leg, subsequent encounter: Secondary | ICD-10-CM | POA: Insufficient documentation

## 2017-01-04 DIAGNOSIS — L03115 Cellulitis of right lower limb: Secondary | ICD-10-CM | POA: Insufficient documentation

## 2017-01-04 DIAGNOSIS — G4730 Sleep apnea, unspecified: Secondary | ICD-10-CM | POA: Diagnosis not present

## 2017-01-04 DIAGNOSIS — Z89511 Acquired absence of right leg below knee: Secondary | ICD-10-CM | POA: Diagnosis not present

## 2017-01-04 DIAGNOSIS — E11622 Type 2 diabetes mellitus with other skin ulcer: Secondary | ICD-10-CM | POA: Diagnosis not present

## 2017-01-04 DIAGNOSIS — J449 Chronic obstructive pulmonary disease, unspecified: Secondary | ICD-10-CM | POA: Insufficient documentation

## 2017-01-04 DIAGNOSIS — L089 Local infection of the skin and subcutaneous tissue, unspecified: Secondary | ICD-10-CM | POA: Insufficient documentation

## 2017-01-04 DIAGNOSIS — L97313 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 of muscle: Secondary | ICD-10-CM | POA: Diagnosis not present

## 2017-01-04 DIAGNOSIS — M1990 Unspecified osteoarthritis, unspecified site: Secondary | ICD-10-CM | POA: Insufficient documentation

## 2017-01-04 DIAGNOSIS — I959 Hypotension, unspecified: Secondary | ICD-10-CM | POA: Insufficient documentation

## 2017-01-04 DIAGNOSIS — X58XXXD Exposure to other specified factors, subsequent encounter: Secondary | ICD-10-CM | POA: Insufficient documentation

## 2017-01-04 DIAGNOSIS — Z87891 Personal history of nicotine dependence: Secondary | ICD-10-CM | POA: Diagnosis not present

## 2017-01-04 DIAGNOSIS — T8189XA Other complications of procedures, not elsewhere classified, initial encounter: Secondary | ICD-10-CM | POA: Diagnosis not present

## 2017-01-04 DIAGNOSIS — I252 Old myocardial infarction: Secondary | ICD-10-CM | POA: Diagnosis not present

## 2017-01-06 ENCOUNTER — Telehealth: Payer: Self-pay | Admitting: Family Medicine

## 2017-01-06 LAB — AEROBIC CULTURE  (SUPERFICIAL SPECIMEN)

## 2017-01-06 LAB — AEROBIC CULTURE W GRAM STAIN (SUPERFICIAL SPECIMEN)

## 2017-01-06 NOTE — Progress Notes (Signed)
Andrew Lyons, Andrew Lyons ([id number]) Visit Report for 01/04/2017 Abuse/Suicide Risk Screen Details Andrew Lyons Date of Service: 01/04/2017 10:30 AM Patient Name: T. Patient Account Number: [id number] Medical Record Treating RN: [name] [id number] Number: Other Clinician: Date of Birth/Sex: September 17, 1946 (71 y.o. Male) Treating Linton Ham Primary Care Saharra Santo: [name], Delfino Lovett Harlin Mazzoni/Extender: G Referring Naresh Althaus: [name], [name] Weeks in Treatment: 0 Abuse/Suicide Risk Screen Items Answer ABUSE/SUICIDE RISK SCREEN: Has anyone close to you tried to hurt or harm you recentlyo No Do you feel uncomfortable with anyone in your familyo No Has anyone forced you do things that you didnot want to doo No Do you have any thoughts of harming yourselfo No Patient displays signs or symptoms of abuse and/or neglect. No Electronic Signature(s) Signed: 01/04/2017 4:54:24 PM By: Alric Quan Entered By: Alric Quan on 01/04/2017 11:00:37 Andrew Lyons ([id number]) -------------------------------------------------------------------------------- Activities of Daily Living Details Andrew Lyons Date of Service: 01/04/2017 10:30 AM Patient Name: T. Patient Account Number: [id number] Medical Record Treating RN: [name] [id number] Number: Other Clinician: Date of Birth/Sex: 27-Nov-1945 (71 y.o. Male) Treating Linton Ham Primary Care Rebekha Diveley: [name], Delfino Lovett Bernice Mullin/Extender: G Referring Maurie Olesen: [name], [name] Weeks in Treatment: 0 Activities of Daily Living Items Answer Activities of Daily Living (Please select one for each item) Drive Automobile Completely Able Take Medications Completely Able Use Telephone Completely Able Care for Appearance Completely Able Use Toilet Completely Able Bath / Shower Completely Able Dress Self Completely Able Feed Self Completely Able Walk Completely Able Get In / Out Bed Completely Able Housework  Completely Able Prepare Meals Completely Able Handle Money Completely Able Shop for Self Completely Able Electronic Signature(s) Signed: 01/04/2017 4:54:24 PM By: Alric Quan Entered By: Alric Quan on 01/04/2017 11:00:55 Andrew Lyons ([id number]) -------------------------------------------------------------------------------- Education Assessment Details Andrew Lyons Date of Service: 01/04/2017 10:30 AM Patient Name: T. Patient Account Number: [id number] Medical Record Treating RN: [name] [id number] Number: Other Clinician: Date of Birth/Sex: [birth year] (71 y.o. Male) Treating Linton Ham Primary Care Raynald Rouillard: [name], Delfino Lovett Emett Stapel/Extender: G Referring Kenzlee Fishburn: [name] in Treatment: 0 Primary Learner Assessed: Patient Learning Preferences/Education Level/Primary Language Learning Preference: Explanation, Printed Material Highest Education Level: Grade School Preferred Language: English Cognitive Barrier Assessment/Beliefs Language Barrier: No Translator Needed: No Memory Deficit: No Emotional Barrier: No Cultural/Religious Beliefs Affecting Medical No Care: Physical Barrier Assessment Impaired Vision: Yes Glasses Impaired Hearing: Yes HOH Knowledge/Comprehension Assessment Knowledge Level: Medium Comprehension Level: Medium Ability to understand written Medium instructions: Ability to understand verbal Medium instructions: Motivation Assessment Anxiety Level: Calm Cooperation: Cooperative Education Importance: Acknowledges Need Interest in Health Problems: Asks Questions Perception: Coherent Willingness to Engage in Self- Medium Management Activities: Readiness to Engage in Self- Medium Management Activities: Andrew Lyons, Andrew Lyons ([id number]) Electronic Signature(s) Signed: 01/04/2017 4:54:24 PM By: Alric Quan Entered By: Alric Quan on 01/04/2017 11:01:18 Andrew Lyons, Andrew Lyons  ([id number]) -------------------------------------------------------------------------------- Fall Risk Assessment Details Andrew Lyons Date of Service: 01/04/2017 10:30 AM Patient Name: T. Patient Account Number: [id number] Medical Record Treating RN: [name] [id number] Number: Other Clinician: Date of Birth/Sex: 09-04-1946 (71 y.o. Male) Treating ROBSON, MICHAEL Primary Care Juquan Reznick: [name], Delfino Lovett Oliwia Berzins/Extender: G Referring Tamesha Ellerbrock: [name], Anabel Bene in Treatment: 0 Fall Risk Assessment Items Have you had 2 or more falls in the last 12 monthso 0 No Have you had any fall that resulted in injury in the last 12 monthso 0 No FALL RISK ASSESSMENT: History of falling - immediate or within 3 months 0 No Secondary diagnosis 0 No  Ambulatory aid None/bed rest/wheelchair/nurse 0 No Crutches/cane/walker 0 No Furniture 0 No IV Access/Saline Lock 0 No Gait/Training Normal/bed rest/immobile 0 No Weak 0 No Impaired 20 Yes Mental Status Oriented to own ability 0 Yes Electronic Signature(s) Signed: 01/04/2017 4:54:24 PM By: Alric Quan Entered By: Alric Quan on 01/04/2017 11:01:34 Andrew Lyons ([id number]) -------------------------------------------------------------------------------- Foot Assessment Details Andrew Lyons Date of Service: 01/04/2017 10:30 AM Patient Name: T. Patient Account Number: [id number] Medical Record Treating RN: [name] [id number] Number: Other Clinician: Date of Birth/Sex: 07/22/1946 (71 y.o. Male) Treating ROBSON, MICHAEL Primary Care Esli Clements: [name], Delfino Lovett Cambelle Suchecki/Extender: G Referring Trinaty Bundrick: [name], [name] Weeks in Treatment: 0 Foot Assessment Items [x]  Unable to perform right foot assessment due to amputation Site Locations + = Sensation present, - = Sensation absent, C = Callus, U = Ulcer R = Redness, W = Warmth, M = Maceration, PU = Pre-ulcerative lesion F = Fissure, S = Swelling,  D = Dryness Assessment Right: Left: Other Deformity: No Prior Foot Ulcer: No Prior Amputation: No Charcot Joint: No Ambulatory Status: Gait: Electronic Signature(s) Signed: 01/04/2017 4:54:24 PM By: Alric Quan Entered By: Alric Quan on 01/04/2017 11:02:57 Andrew Lyons ([id number], Andrew Lyons ([id number]) -------------------------------------------------------------------------------- Nutrition Risk Assessment Details Andrew Lyons Date of Service: 01/04/2017 10:30 AM Patient Name: T. Patient Account Number: [id number] Medical Record Treating RN: [name] [id number] Number: Other Clinician: Date of Birth/Sex: 07/02/1946 (71 y.o. Male) Treating ROBSON, Cody Primary Care Patterson Hollenbaugh: [name], Delfino Lovett Jamaury Gumz/Extender: G Referring Kyrollos Cordell: [name], [name] Weeks in Treatment: 0 Height (in): 70 Weight (lbs): 266.5 Body Mass Index (BMI): 38.2 Nutrition Risk Assessment Items NUTRITION RISK SCREEN: I have an illness or condition that made me change the kind and/or 0 No amount of food I eat I eat fewer than two meals per day 0 No I eat few fruits and vegetables, or milk products 0 No I have three or more drinks of beer, liquor or wine almost every day 0 No I have tooth or mouth problems that make it hard for me to eat 0 No I don't always have enough money to buy the food I need 0 No I eat alone most of the time 0 No I take three or more different prescribed or over-the-counter drugs a 1 Yes day Without wanting to, I have lost or gained 10 pounds in the last six 0 No months I am not always physically able to shop, cook and/or feed myself 0 No Nutrition Protocols Good Risk Protocol Moderate Risk Protocol Electronic Signature(s) Signed: 01/04/2017 4:54:24 PM By: Alric Quan Entered By: Alric Quan on 01/04/2017 11:01:41

## 2017-01-06 NOTE — Progress Notes (Signed)
Andrew, Lyons ([id number]) Visit Report for 01/04/2017 Allergy List Details Patient Name: Andrew Lyons, Andrew Lyons Date of Service: 01/04/2017 10:30 AM Medical Record Number: [id number] Patient Account Number: [id number] Date of Birth/Sex: 11-22-1945 (70 y.o. Male) Treating RN: [name] Primary Care Choua Ikner: [name], Delfino Lovett Other Clinician: Referring Chevon Fomby: [name], [name] Treating Fox Salminen/Extender: [name] in Treatment: 0 Allergies Active Allergies Zyban Zocor Allergy Notes Electronic Signature(s) Signed: 01/04/2017 4:54:24 PM By: Alric Quan Entered By: Alric Quan on 01/04/2017 10:51:46 Andrew Lyons ([id number]) -------------------------------------------------------------------------------- Arrival Information Details Patient Name: Andrew Lyons Date of Service: 01/04/2017 10:30 AM Medical Record Number: [id number] Patient Account Number: [id number] Date of Birth/Sex: 11-13-1945 (71 y.o. Male) Treating RN: [name] Primary Care Case Vassell: [name], Delfino Lovett Other Clinician: Referring Mariaeduarda Defranco: [name] Treating Abiageal Blowe/Extender: [name] in Treatment: 0 Visit Information Patient Arrived: Ambulatory Arrival Time: 10:44 Accompanied By: wife Transfer Assistance: None Patient Identification Verified: Yes Secondary Verification Process Yes Completed: Patient Requires Transmission-Based No Precautions: Patient Has Alerts: Yes Patient Alerts: DM II Electronic Signature(s) Signed: 01/04/2017 4:54:24 PM By: Alric Quan Entered By: Alric Quan on 01/04/2017 10:47:49 Andrew Lyons ([id number]) -------------------------------------------------------------------------------- Clinic Level of Care Assessment Details Patient Name: Andrew Lyons Date of Service: 01/04/2017 10:30 AM Medical Record Number: [id number] Patient Account Number: [id number] Date of Birth/Sex:  1946-03-18 (71 y.o. Male) Treating RN: [name] Primary Care Rylee Nuzum: [name], Delfino Lovett Other Clinician: Referring Cybele Maule: [name], [name] Treating Satsuki Zillmer/Extender: [name] in Treatment: 0 Clinic Level of Care Assessment Items TOOL 1 Quantity Score X - Use when EandM and Procedure is performed on INITIAL visit 1 0 ASSESSMENTS - Nursing Assessment / Reassessment X - General Physical Exam (combine w/ comprehensive assessment (listed just 1 20 below) when performed on new pt. evals) X - Comprehensive Assessment (HX, ROS, Risk Assessments, Wounds Hx, etc.) 1 25 ASSESSMENTS - Wound and Skin Assessment / Reassessment []  - Dermatologic / Skin Assessment (not related to wound area) 0 ASSESSMENTS - Ostomy and/or Continence Assessment and Care []  - Incontinence Assessment and Management 0 []  - Ostomy Care Assessment and Management (repouching, etc.) 0 PROCESS - Coordination of Care []  - Simple Patient / Family Education for ongoing care 0 X - Complex (extensive) Patient / Family Education for ongoing care 1 20 X - Staff obtains Programmer, systems, Records, Test Results / Process Orders 1 10 []  - Staff telephones HHA, Nursing Homes / Clarify orders / etc 0 []  - Routine Transfer to another Facility (non-emergent condition) 0 []  - Routine Hospital Admission (non-emergent condition) 0 X - New Admissions / Biomedical engineer / Ordering NPWT, Apligraf, etc. 1 15 []  - Emergency Hospital Admission (emergent condition) 0 PROCESS - Special Needs []  - Pediatric / Minor Patient Management 0 []  - Isolation Patient Management 0 AIKEEM, LILLEY ([id number]) []  - Hearing / Language / Visual special needs 0 []  - Assessment of Community assistance (transportation, D/C planning, etc.) 0 []  - Additional assistance / Altered mentation 0 []  - Support Surface(s) Assessment (bed, cushion, seat, etc.) 0 INTERVENTIONS - Miscellaneous []  - External ear exam 0 []  - Patient Transfer  (multiple staff / Civil Service fast streamer / Similar devices) 0 []  - Simple Staple / Suture removal (25 or less) 0 []  - Complex Staple / Suture removal (26 or more) 0 []  - Hypo/Hyperglycemic Management (do not check if billed separately) 0 []  - Ankle / Brachial Index (ABI) - do not check if billed separately 0 Has the patient  been seen at the hospital within the last three years: Yes Total Score: 90 Level Of Care: New/Established - Level 3 Electronic Signature(s) Signed: 01/04/2017 4:54:24 PM By: Alric Quan Entered By: Alric Quan on 01/04/2017 12:59:56 Andrew Lyons ([id number]) -------------------------------------------------------------------------------- Encounter Discharge Information Details Patient Name: Andrew Lyons Date of Service: 01/04/2017 10:30 AM Medical Record Number: [id number] Patient Account Number: [id number] Date of Birth/Sex: May 20, 1946 (71 y.o. Male) Treating RN: [name] Primary Care Adiva Boettner: [name] Other Clinician: Referring Tija Biss: [name] Treating Geremiah Fussell/Extender: [name] in Treatment: 0 Encounter Discharge Information Items Discharge Pain Level: 0 Discharge Condition: Stable Ambulatory Status: Ambulatory Discharge Destination: Home Transportation: Private Auto Accompanied By: wife Schedule Follow-up Appointment: Yes Medication Reconciliation completed and provided to Patient/Care No Rayyan Burley: Provided on Clinical Summary of Care: 01/04/2017 Form Type Recipient Paper Patient KA Electronic Signature(s) Signed: 01/04/2017 11:49:44 AM By: Ruthine Dose Entered By: Ruthine Dose on 01/04/2017 11:49:44 Andrew Lyons ([id number]) -------------------------------------------------------------------------------- Lower Extremity Assessment Details Patient Name: Andrew Lyons Date of Service: 01/04/2017 10:30 AM Medical Record Number: [id number] Patient Account Number: [id number] Date  of Birth/Sex: Apr 03, 1946 (71 y.o. Male) Treating RN: [name] Primary Care Brileigh Sevcik: [name] Other Clinician: Referring Drako Maese: [name], [name] Treating Estephanie Hubbs/Extender: [name] in Treatment: 0 Vascular Assessment Pulses: Posterior Tibial Extremity colors, hair growth, and conditions: Extremity Color: [Right:Normal] Temperature of Extremity: [Right:Warm] Capillary Refill: [Right:< 3 seconds] Electronic Signature(s) Signed: 01/04/2017 4:54:24 PM By: Alric Quan Entered By: Alric Quan on 01/04/2017 11:13:15 Andrew Lyons ([id number]) -------------------------------------------------------------------------------- Multi Wound Chart Details Patient Name: Andrew Lyons Date of Service: 01/04/2017 10:30 AM Medical Record Number: [id number] Patient Account Number: [id number] Date of Birth/Sex: May 08, 1946 (71 y.o. 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] [N/A:N/A] Wounding Event: [1:Gradually Appeared] [N/A:N/A] Primary Etiology: [1:Atypical] [N/A:N/A] Comorbid History: [1:Cataracts, Chronic Obstructive Pulmonary Disease (COPD), Sleep Apnea, Hypotension, Myocardial Infarction, Type II Diabetes, Osteoarthritis, Neuropathy] [N/A:N/A] Date Acquired: [1:08/26/2016] [N/A:N/A] Weeks of Treatment: [1:0] [N/A:N/A] Wound Status: [1:Open] [N/A:N/A] Measurements L x W x D 0.2x0.5x0.9 [N/A:N/A] (cm) Area (cm) : [1:0.079] [N/A:N/A] Volume (cm) : [1:0.071] [N/A:N/A] Starting Position 1 12 (o'clock): Ending  Position 1 [1:12] (o'clock): Maximum Distance 1 1.2 (cm): Undermining: [1:Yes] [N/A:N/A] Classification: [1:Partial Thickness] [N/A:N/A] Exudate Amount: [1:Large] [N/A:N/A] Exudate Type: [1:Serous] [N/A:N/A] Exudate Color: [1:amber] [N/A:N/A] Wound Margin: Distinct, outline attached N/A N/A Granulation Amount: None Present (0%) N/A N/A Necrotic Amount: Large (67-100%) N/A N/A Exposed Structures: Fascia: No N/A N/A Fat Layer (Subcutaneous Tissue) Exposed: No Tendon: No Muscle: No Joint: No Bone: No Limited to Skin Breakdown Epithelialization: None N/A N/A Debridement: Debridement (18299- N/A N/A 11047) Pre-procedure 11:22 N/A N/A Verification/Time Out Taken: Pain Control: Lidocaine 4% Topical N/A N/A Solution Tissue Debrided: Fibrin/Slough, Exudates, N/A N/A Subcutaneous Level: Skin/Subcutaneous N/A N/A Tissue Debridement Area (sq 0.1 N/A N/A cm): Instrument: Blade, Forceps N/A N/A Specimen: Swab N/A N/A Number of Specimens 1 N/A N/A Taken: Bleeding: Minimum N/A N/A Hemostasis Achieved: Pressure N/A N/A Procedural Pain: 0 N/A N/A Post Procedural Pain: 0 N/A N/A Debridement Treatment Procedure was tolerated N/A N/A Response: well Post Debridement 1x0.8x1 N/A N/A Measurements L x W x D (cm)  Post Debridement 0.628 N/A N/A Volume: (cm) Periwound Skin Texture: No Abnormalities Noted N/A N/A Periwound Skin No Abnormalities Noted N/A N/A Moisture: Periwound Skin Color: No Abnormalities Noted N/A N/A Temperature: No Abnormality N/A N/A Tenderness on Yes N/A N/A Palpation: Wound Preparation: N/A N/A DUARD, SPIEWAK ([id number]) Ulcer Cleansing: Rinsed/Irrigated with Saline Topical Anesthetic Applied: Other: lidocaine 4% Procedures Performed: Debridement N/A N/A Treatment Notes 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 Cleansed with: Clean wound with Normal Saline 2. Anesthetic Topical Lidocaine 4% cream to wound bed prior to debridement 3.  Peri-wound Care: Skin Prep 4. Dressing Applied: Aquacel Ag 5. Secondary Dressing Applied Bordered Foam Dressing Electronic Signature(s) Signed: 01/04/2017 5:13:56 PM By: Linton Ham MD Entered By: Linton Ham on 01/04/2017 11:54:13 Andrew Lyons ([id number]) -------------------------------------------------------------------------------- Page Details Patient Name: Andrew Lyons Date of Service: 01/04/2017 10:30 AM Medical Record Number: [id number] Patient Account Number: [id number] Date of Birth/Sex: 09/15/1946 (71 y.o. Male) Treating RN: [name] Primary Care Roverto Bodmer: [name], Delfino Lovett Other Clinician: Referring Anjannette Gauger: [name], [name] Treating Telesia Ates/Extender: [name] in Treatment: 0 Active Inactive ` Abuse / Safety / Falls / Self Care Management Nursing Diagnoses: Potential for falls Goals: Patient will remain injury free Date Initiated: 01/04/2017 Target Resolution Date: 04/01/2017 Goal Status: Active Interventions: Assess fall risk on admission and as needed Assess self care needs on admission and as needed Notes: ` Orientation to the Wound Care Program Nursing Diagnoses: Knowledge deficit related to the wound healing center program Goals: Patient/caregiver will verbalize understanding of the [facility] Date Initiated: 01/04/2017 Target Resolution Date: 01/28/2017 Goal Status: Active Interventions: Provide education on orientation to the wound center Notes: ` Wound/Skin Impairment Nursing Diagnoses: Impaired tissue integrity MILT, COYE ([id number]) Knowledge deficit related to ulceration/compromised skin integrity Goals: Ulcer/skin breakdown will have a volume reduction of 80% by week 12 Date Initiated: 01/04/2017 Target Resolution Date: 04/22/2017 Goal Status: Active Interventions: Assess patient/caregiver ability to perform ulcer/skin care regimen upon  admission and as needed Assess ulceration(s) every visit Notes: Electronic Signature(s) Signed: 01/04/2017 4:54:24 PM By: Alric Quan Entered By: Alric Quan on 01/04/2017 11:20:22 Andrew Lyons ([id number]) -------------------------------------------------------------------------------- Pain Assessment Details Patient Name: Andrew Lyons Date of Service: 01/04/2017 10:30 AM Medical Record Number: [id number] Patient Account Number: [id number] Date of Birth/Sex: 1945-10-27 (71 y.o. Male) Treating RN: [name] Primary Care Kella Splinter: [name] Other Clinician: Referring Eugina Row: [name] Treating Ellese Julius/Extender: [name] in Treatment: 0 Active Problems Location of Pain Severity and Description of Pain Patient Has Paino No Site Locations With Dressing Change: No Pain Management and Medication Current Pain Management: Electronic Signature(s) Signed: 01/04/2017 4:54:24 PM By: Alric Quan Entered By: Alric Quan on 01/04/2017 10:48:13 Andrew Lyons ([id number]) -------------------------------------------------------------------------------- Patient/Caregiver Education Details [name] Date of Service: 01/04/2017 10:30 AM Patient Name: T. Patient Account Number: [id number] Medical Record Treating RN: [name] [id number] Number: Other Clinician: Date of Birth/Gender: Feb 03, 1946 (71 y.o. Male) Treating Linton Ham Primary Care Physician: [name], Delfino Lovett Physician/Extender: G Referring Physician: [name] in Treatment: 0 Education Assessment Education Provided To: Patient Education Topics Provided Welcome To [facility] [facility]: Handouts: Welcome To [facility] [place] Methods: Explain/Verbal Responses: State content correctly Wound/Skin Impairment: Handouts: Other: change dressing as ordered Methods: Demonstration, Explain/Verbal Responses: State content  correctly Electronic Signature(s) Signed: 01/04/2017 4:54:24 PM By: Alric Quan Entered By: Alric Quan on 01/04/2017 11:37:33 Le[place]ille, Zack Seal ([id number]) -------------------------------------------------------------------------------- Wound  Assessment Details Patient Name: SANKALP, FERRELL Date of Service: 01/04/2017 10:30 AM Medical Record Number: [id number] Patient Account Number: [id number] Date of Birth/Sex: 1946-08-29 (71 y.o. 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 Wound Open Status: Wounding Event: Gradually Appeared Comorbid Cataracts, Chronic Obstructive Date Acquired: 08/26/2016 History: Pulmonary Disease (COPD), Sleep Weeks Of Treatment: 0 Apnea, Hypotension, Myocardial Clustered Wound: No Infarction, Type II Diabetes, Osteoarthritis, Neuropathy Photos Photo Uploaded By: Alric Quan on 01/04/2017 13:01:52 Wound Measurements Length: (cm) 0.2 Width: (cm) 0.5 Depth: (cm) 0.9 Area: (cm) 0.079 Volume: (cm) 0.071 % Reduction in Area: % Reduction in Volume: Epithelialization: None Tunneling: No Undermining: Yes Starting Position (o'clock): 12 Ending Position (o'clock): 12 Maximum Distance: (cm) 1.2 Wound Description Classification: Partial Thickness Wound Margin: Distinct, outline attached Exudate Amount: Large Exudate Type: Serous Exudate Color: amber ESVIN, HNAT ([id number]) Foul Odor After Cleansing: No Slough/Fibrino Yes Wound Bed Granulation Amount: None Present (0%) Exposed Structure Necrotic Amount: Large (67-100%) Fascia Exposed: No Necrotic Quality: Adherent Slough Fat Layer (Subcutaneous Tissue) Exposed: No Tendon Exposed: No Muscle Exposed:  No Joint Exposed: No Bone Exposed: No Limited to Skin Breakdown Periwound Skin Texture Texture Color No Abnormalities Noted: No No Abnormalities Noted: No Moisture Temperature / Pain No Abnormalities Noted: No Temperature: No Abnormality Tenderness on Palpation: Yes Wound Preparation Ulcer Cleansing: Rinsed/Irrigated with Saline Topical Anesthetic Applied: Other: lidocaine 4%, Electronic Signature(s) Signed: 01/04/2017 4:54:24 PM By: Alric Quan Entered By: Alric Quan on 01/04/2017 11:11:22 Andrew Lyons ([id number]) -------------------------------------------------------------------------------- Vitals Details Patient Name: Andrew Lyons Date of Service: 01/04/2017 10:30 AM Medical Record Number: [id number] Patient Account Number: [id number] Date of Birth/Sex: September 20, 1946 (71 y.o. Male) Treating RN: [name] Primary Care Nikaela Coyne: [name], Delfino Lovett Other Clinician: Referring Edras Wilford: [name], [name] Treating Orlanda Frankum/Extender: [name] in Treatment: 0 Vital Signs Time Taken: 10:48 Temperature (F): 97.6 Height (in): 70 Pulse (bpm): 73 Source: Stated Respiratory Rate (breaths/min): 20 Weight (lbs): 266.5 Blood Pressure (mmHg): 155/74 Source: Measured Reference Range: 80 - 120 mg / dl Body Mass Index (BMI): 38.2 Electronic Signature(s) Signed: 01/04/2017 4:54:24 PM By: Alric Quan Entered By: Alric Quan on 01/04/2017 10:50:19

## 2017-01-06 NOTE — Telephone Encounter (Signed)
Pt wife called to request a Rx for a heavy duty scooter to use in the home.  Fax to [employer] @ fax [phone]  Pt wife is also requesting pt to change the Rx canagliflozin (INVOKANA) 300 MG TABS tablet to something different due to side effects/MW  CB#[phone]/MW

## 2017-01-06 NOTE — Progress Notes (Signed)
Andrew Lyons ([id number]) Visit Report for 01/04/2017 Chief Complaint Document Details Andrew Lyons Date of Service: 01/04/2017 10:30 AM Patient Name: T. Patient Account Number: [id number] Medical Record Treating RN: [name] [id number] Number: Other Clinician: Date of Birth/Sex: [birth year] (71 y.o. 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 Electronic Signature(s) Signed: 01/04/2017 5:13:56 PM By: Linton Ham MD Entered By: Linton Ham on 01/04/2017 11:55:34 Andrew Lyons ([id number]) -------------------------------------------------------------------------------- Debridement Details Andrew Lyons Date of Service: 01/04/2017 10:30 AM Patient Name: T. Patient Account Number: [id number] Medical Record Treating RN: [name] [id number] Number: Other Clinician: Date of Birth/Sex: March 29, 1946 (71 y.o. Male) Treating Shiva Sahagian, [place] Primary Care Provider: [name], Delfino Lovett Provider/Extender: Andrew Referring Provider: [name], [name] Weeks in Treatment: 0 Debridement Performed for Wound #1 Right Amputation Site - Below Knee Assessment: Performed By: Physician [name], MD Debridement: Debridement Pre-procedure Yes - 11:22 Verification/Time Out Taken: Start Time: 11:23 Pain Control: Lidocaine 4% Topical Solution Level: Skin/Subcutaneous Tissue Total Area Debrided (L x 0.2 (cm) x 0.5 (cm) = 0.1 (cm) W): Tissue and other Viable, Non-Viable, Exudate, Fibrin/Slough, Subcutaneous material debrided: Instrument: Blade, Forceps Specimen: Swab Number of Specimens 1 Taken: Bleeding: Minimum Hemostasis Achieved: Pressure End Time: 11:28 Procedural Pain: 0 Post Procedural  Pain: 0 Response to Treatment: Procedure was tolerated well Post Debridement Measurements of Total Wound Length: (cm) 1 Width: (cm) 0.8 Depth: (cm) 1 Volume: (cm) 0.628 Character of Wound/Ulcer Post Requires Further Debridement Debridement: Severity of Tissue Post Debridement: Fat layer exposed Post Procedure Diagnosis Same as Pre-procedure KASHTEN, Andrew Lyons ([id number]) Electronic Signature(s) Signed: 01/04/2017 4:54:24 PM By: Alric Quan Signed: 01/04/2017 5:13:56 PM By: Linton Ham MD Entered By: Alric Quan on 01/04/2017 16:36:18 Andrew Lyons ([id number]) -------------------------------------------------------------------------------- HPI Details Andrew Lyons Date of Service: 01/04/2017 10:30 AM Patient Name: T. Patient Account Number: [id number] Medical Record Treating RN: [name] [id number] Number: Other Clinician: Date of Birth/Sex: 10/04/1945 (71 y.o. Male) Treating [name], [place] Primary Care Provider: [name] Provider/Extender: Andrew Referring Provider: [name] in Treatment: 0 History of Present Illness HPI Description: 01/04/17; this is a 71 year old diabetic man who has a remote history of a traumatic lower extremity damage at age 82 requiring an amputation. He tells me spent 2 years walking on crutches then ultimately has been walking on prosthesis without any trouble since then. Several months ago he had a new prosthesis and developed an open area in the right stump in December. He saw podiatry Dr. Prudence Davidson who noted that this was really out of his practice jurisdiction and referred him here. He has been using mupirocin. He has continued to walk on the prosthesis. The prosthesis itself as been adjusted by the prostatitis. He does not have a known arterial issue. He tells me he probably has diabetic neuropathy. He had a wound that took a long time to heal surrounding the actual amputation itself however is not  had more recent wounds. Electronic Signature(s) Signed: 01/04/2017 5:13:56 PM By: Linton Ham MD Entered By: Linton Ham on 01/04/2017 11:59:13 Andrew Lyons ([id number]) -------------------------------------------------------------------------------- Physical Exam Details [name] of Service: 01/04/2017 10:30 AM Patient Name: T. Patient Account Number: [id number] Medical Record Treating RN: [name] [id number] Number: Other Clinician: Date of Birth/Sex: 10/30/1945 (71 y.o. Male) Treating Linton Ham Primary Care  Provider: [name], Delfino Lovett Provider/Extender: Andrew Referring Provider: [name] in Treatment: 0 Constitutional Patient is hypertensive.. Pulse regular and within target range for patient.Marland Kitchen Respirations regular, non-labored and within target range.. Temperature is normal and within the target range for the patient.. morbid obesity. No distress. Eyes Conjunctivae clear. No discharge.Marland Kitchen Respiratory Respiratory effort is easy and symmetric bilaterally. Rate is normal at rest and on room air.. Bilateral breath sounds are clear and equal in all lobes with no wheezes, rales or rhonchi.. Cardiovascular Heart rhythm and rate regular, without murmur or gallop.. I believe his femoral and popliteal pulses are palpable on the right. Distal below-knee amputation. Gastrointestinal (GI) Distended but no overt masses or tenderness. Lymphatic None palpable in the right popliteal renal area. Integumentary (Hair, Skin) There is erythema on the medial aspect of the stump but no overt tenderness. Neurological Lack of light touch in the right leg. Psychiatric No evidence of depression, anxiety, or agitation. Calm, cooperative, and communicative. Appropriate interactions and affect.. Notes Wound exam; the areas on the medial aspect of the patient's original distal below-knee amputation site. This was initially a small opening with a  considerable tunnel. Using pickups and a scalpel I remove nonviable skin and subcutaneous tissue necrotic material and on the medial aspect likely necrotic muscle. There was no palpable bone. On the medial aspect of the stump there is erythema but without tenderness. His wife thinks that this is more obvious than she remembers. Although he is not tender he is also neuropathic, I simply can't rule out coexistent wound infection here although I am doubtful Electronic Signature(s) Signed: 01/04/2017 5:13:56 PM By: Linton Ham MD Entered By: Linton Ham on 01/04/2017 12:02:07 DEVION, CHRISCOE ([id number]) JAMEE, PACHOLSKI ([id number]) -------------------------------------------------------------------------------- Physician Orders Details Andrew Lyons Date of Service: 01/04/2017 10:30 AM Patient Name: T. Patient Account Number: [id number] Medical Record Treating RN: [name] [id number] Number: Other Clinician: Date of Birth/Sex: Nov 04, 1945 (70 y.o. Male) Treating [name], [place] Primary Care Provider: [name], Delfino Lovett Provider/Extender: Andrew Referring Provider: [name] in Treatment: 0 Verbal / Phone Orders: Yes Clinician: [name], [name] Read Back and Verified: Yes Diagnosis Coding Wound Cleansing Wound #1 Right Amputation Site - Below Knee o Clean wound with Normal Saline. o Cleanse wound with mild soap and water Anesthetic Wound #1 Right Amputation Site - Below Knee o Topical Lidocaine 4% cream applied to wound bed prior to debridement Skin Barriers/Peri-Wound Care Wound #1 Right Amputation Site - Below Knee o Skin Prep Primary Wound Dressing Wound #1 Right Amputation Site - Below Knee o Aquacel Ag - rope Secondary Dressing Wound #1 Right Amputation Site - Below Knee o Boardered Foam Dressing Dressing Change Frequency Wound #1 Right Amputation Site - Below Knee o Change dressing every day. Follow-up Appointments Wound #1  Right Amputation Site - Below Knee o Return Appointment in 1 week. Edema Control Wound #1 Right Amputation Site - Below Knee o Elevate legs to the level of the heart and pump ankles as often as possible Mcnally, Longino T. ([id number]) Off-Loading o Other: - do not wear our prosthesis Additional Orders / Instructions Wound #1 Right Amputation Site - Below Knee o Increase protein intake. Medications-please add to medication list. Wound #1 Right Amputation Site - Below Knee o P.O. Antibiotics - doxycycline o Other: - vitamin C, zinc, MVI Laboratory o Bacteria identified in Wound by Culture (MICRO) oooo LOINC Code: 3825-0 oooo Convenience Name: Wound culture routine Patient Medications Allergies: Zyban, Zocor Notifications Medication Indication Start End  doxycycline monohydrate 01/04/2017 DOSE bid - oral 100 mg capsule - bid capsule oral Electronic Signature(s) Signed: 01/04/2017 4:54:24 PM By: Alric Quan Signed: 01/04/2017 5:13:56 PM By: Linton Ham MD Previous Signature: 01/04/2017 11:46:00 AM Version By: Linton Ham MD Entered By: Alric Quan on 01/04/2017 12:33:12 PAGE, LANCON ([id number]) -------------------------------------------------------------------------------- Problem List Details Andrew Lyons Date of Service: 01/04/2017 10:30 AM Patient Name: T. Patient Account Number: [id number] Medical Record Treating RN: [name] [id number] Number: Other Clinician: Date of Birth/Sex: August 29, 1946 (70 y.o. 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 Patient Account Number: [id number] Medical Record Treating RN: [name] [id number] Number: Other Clinician: Date of Birth/Sex: [birth year] (70 y.o. Male) Treating Linton Ham Primary Care Provider: [name] Provider/Extender: Andrew Referring Provider: [name] in Treatment: 0 Subjective Chief Complaint Information obtained from Patient 01/04/17; patient is here for review of a nonhealing wound on his right leg below-knee amputation site. History of Present Illness (HPI) 01/04/17; this is a 71 year old diabetic man who has a remote history of a traumatic lower extremity damage at age 43 requiring an amputation. He tells me spent 2 years walking on crutches then ultimately has been walking on prosthesis without any trouble since then. Several months ago he had a new prosthesis and developed an open area in the right stump in December. He saw podiatry Dr. Prudence Davidson who noted that this was really out of his practice jurisdiction and referred him here. He has been using mupirocin. He has continued to walk on the prosthesis. The prosthesis itself as been adjusted by the prostatitis. He does not have a known arterial issue. He tells me he probably has diabetic neuropathy. He had a wound that took a long time to heal surrounding the actual amputation itself however is not had more recent wounds. Wound History Patient presents with 1 open wound that has been present for  approximately dec 2017. Patient has been treating wound in the following manner: mupirocin. Laboratory tests have not been performed in the last month. Patient reportedly has not tested positive for an antibiotic resistant organism. Patient reportedly has not tested positive for osteomyelitis. Patient reportedly has not had testing performed to evaluate circulation in the legs. Patient experiences the following problems associated with their wounds: swelling. Patient History Information obtained from Patient. Allergies Zyban, Zocor Family History Diabetes - Father. Social History Former smoker - quit 15 yrs ago, Marital Status - Married, Alcohol Use - Rarely, Drug Use - No History, DAVIONNE, DOWTY T. ([id number]) Caffeine Use - Daily. Medical History Eyes Patient has history of Cataracts Respiratory Patient has history of Chronic Obstructive Pulmonary Disease (COPD), Sleep Apnea Cardiovascular Patient has history of Hypotension, Myocardial Infarction - 2006 Endocrine Patient has history of Type II Diabetes Musculoskeletal Patient has history of Osteoarthritis Neurologic Patient has history of Neuropathy Patient is treated with Insulin,  Oral Agents. Blood sugar is not tested. Review of Systems (ROS) Constitutional Symptoms (General Health) The patient has no complaints or symptoms. Eyes Complains or has symptoms of Glasses / Contacts. Ear/Nose/Mouth/Throat [facility] Hematologic/Lymphatic The patient has no complaints or symptoms. Cardiovascular stents heart vavle replacement Gastrointestinal The patient has no complaints or symptoms. Genitourinary unable to urinate Immunological The patient has no complaints or symptoms. Integumentary (Skin) Complains or has symptoms of Wounds. Oncologic The patient has no complaints or symptoms. Psychiatric The patient has no complaints or symptoms. Objective Constitutional DEAN, GOLDNER ([id number]) Patient is hypertensive..  Pulse regular and within target range for patient.Marland Kitchen Respirations regular, non-labored and within target range.. Temperature is normal and within the target range for the patient.. morbid obesity. No distress. Vitals Time Taken: 10:48 AM, Height: 70 in, Source: Stated, Weight: 266.5 lbs, Source: Measured, BMI: 38.2, Temperature: 97.6 F, Pulse: 73 bpm, Respiratory Rate: 20 breaths/min, Blood Pressure: 155/74 mmHg. Eyes Conjunctivae clear. No discharge.Marland Kitchen Respiratory Respiratory effort is easy and symmetric bilaterally. Rate is normal at rest and on room air.. Bilateral breath sounds are clear and equal in all lobes with no wheezes, rales or rhonchi.. Cardiovascular Heart rhythm and rate regular, without murmur or gallop.. I believe his femoral and popliteal pulses are palpable on the right. Distal below-knee amputation. Gastrointestinal (GI) Distended but no overt masses or tenderness. Lymphatic None palpable in the right popliteal renal area. Neurological Lack of light touch in the right leg. Psychiatric No evidence of depression, anxiety, or agitation. Calm, cooperative, and communicative. Appropriate interactions and affect.. General Notes: Wound exam; the areas on the medial aspect of the patient's original distal below-knee amputation site. This was initially a small opening with a considerable tunnel. Using pickups and a scalpel I remove nonviable skin and subcutaneous tissue necrotic material and on the medial aspect likely necrotic muscle. There was no palpable bone. On the medial aspect of the stump there is erythema but without tenderness. His wife thinks that this is more obvious than she remembers. Although he is not tender he is also neuropathic, I simply can't rule out coexistent wound infection here although I am doubtful Integumentary (Hair, Skin) There is erythema on the medial aspect of the stump but no overt tenderness. Wound #1 status is Open. Original cause of wound  was Gradually Appeared. 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 The wound measures 0.2cm length x 0.5cm width x 0.9cm depth; 0.079cm^2 area and 0.071cm^3 volume. The wound is limited to skin breakdown. There is no tunneling noted, however, there is undermining starting at 12:00 and ending at 12:00 with a maximum distance of 1.2cm. There is a large amount of serous drainage noted. The wound margin is distinct with the outline attached to the wound base. There is no granulation within the wound bed. There is a large (67-100%) amount of necrotic tissue within the wound bed including Adherent Slough. Periwound temperature was ASHOK, SAWAYA T. ([id number]) noted as No Abnormality. The periwound has tenderness on palpation. 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 There was a Skin/Subcutaneous Tissue Debridement ([id number]) debridement with total area of 0.1 sq cm  performed by [name], MD. with the following instrument(s): Blade and Forceps to remove Viable and Non-Viable tissue/material including Exudate, Fibrin/Slough, Muscle, and Subcutaneous after achieving pain control using Lidocaine 4% Topical Solution. 1 Specimen was taken by a Swab and sent to the lab per facility protocol.A time out was conducted at 11:22, prior to the start of the procedure. A Minimum amount of bleeding was controlled with Pressure. The procedure was tolerated well with a pain level of 0 throughout and a pain level of 0 following the procedure. Post Debridement Measurements: 1cm length x 0.8cm width x 1cm depth;  0.628cm^3 volume. Character of Wound/Ulcer Post Debridement requires further debridement. Severity of Tissue Post Debridement is: Necrosis of muscle. Post procedure Diagnosis Wound #1: Same as Pre-Procedure Plan Wound Cleansing: Wound #1 Right Amputation Site - Below Elbow: Clean wound with Normal Saline. Cleanse wound with mild soap and water Anesthetic: GILAD, DUGGER ([id number]) Wound #1 Right Amputation Site - Below Elbow: Topical Lidocaine 4% cream applied to wound bed prior to debridement Skin Barriers/Peri-Wound Care: Wound #1 Right Amputation Site - Below Elbow: Skin Prep Primary Wound Dressing: Wound #1 Right Amputation Site - Below Elbow: Aquacel Ag - rope Secondary Dressing: Wound #1 Right Amputation Site - Below Elbow: Boardered Foam Dressing Dressing Change Frequency: Wound #1 Right Amputation Site - Below Elbow: Change dressing every day. Follow-up Appointments: Wound #1 Right Amputation Site - Below Elbow: Return Appointment in 1 week. Edema Control: Wound #1 Right Amputation Site - Below Elbow: Elevate legs to the level of the heart and pump ankles as often as possible Off-Loading: Other: - do not wear our prosthesis Additional Orders / Instructions: Wound #1 Right Amputation Site - Below Elbow: Increase protein intake. Medications-please add to medication list.: Wound #1 Right Amputation Site - Below Elbow: P.O. Antibiotics - doxycycline Other: - vitamin C, zinc, MVI The following medication(s) was prescribed: doxycycline monohydrate oral 100 mg capsule bid bid capsule oral starting 01/04/2017 #1 silver alginate to the wound with careful attention to the medial aspect of the wound it is here that the wound has most depth #2 border foam cover #3 I told the patient that he absolutely cannot use the prosthesis otherwise there is no hope of healing this area. She had already discussed this with his wife and brought his old crutches. He seems to do all  right with this #4 I don't believe there is a arterial issue here he has palpable femoral and popliteal pulses and the stump site itself is warm. I'll see how this goes for 2 or 3 weeks however I told the patient that he may need more formal arterial studies i.e. segmental ABIs and arterial Dopplers and/or tcoms IANMICHAEL, AMESCUA T. ([id number]) #5 the medial erythema on the stump site is concerning. Although I thought this all might be pressure his wife was fairly adamant that this was worse than she remembers. I cultured the wound and gave them emperic doxycycline on 100 twice a day for 7 days while we await the deep medial wound culture #6 the patient had an x-ray of the underlying bone and the podiatry office which is present in [place] although I can't draw up and official report. The patient sent his wife both state that he there were told there was no bone damage Electronic Signature(s) Signed: 01/04/2017 5:13:56 PM By: Linton Ham MD Entered By: Linton Ham on 01/04/2017 12:05:37 Andrew Lyons ([id number]) -------------------------------------------------------------------------------- ROS/PFSH Details Andrew Lyons Date of Service: 01/04/2017 10:30 AM Patient  Name: T. Patient Account Number: [id number] Medical Record Treating RN: [name] [id number] Number: Other Clinician: Date of Birth/Sex: 1945/10/11 (71 y.o. Male) Treating Paydon Carll, [place] Primary Care Provider: [name], Delfino Lovett Provider/Extender: Andrew Referring Provider: [name], [name] Weeks in Treatment: 0 Information Obtained From Patient Wound History Do you currently have one or more open woundso Yes How many open wounds do you currently haveo 1 Approximately how long have you had your woundso dec 2017 How have you been treating your wound(s) until nowo mupirocin Has your wound(s) ever healed and then re-openedo No Have you had any lab work done in the past montho No Have you tested positive  for an antibiotic resistant organism (MRSA, VRE)o No Have you tested positive for osteomyelitis (bone infection)o No Have you had any tests for circulation on your legso No Have you had other problems associated with your woundso Swelling Eyes Complaints and Symptoms: Positive for: Glasses / Contacts Medical History: Positive for: Cataracts Integumentary (Skin) Complaints and Symptoms: Positive for: Wounds Constitutional Symptoms (General Health) Complaints and Symptoms: No Complaints or Symptoms Ear/Nose/Mouth/Throat Complaints and Symptoms: Review of System Notes: [facility] Hematologic/Lymphatic ELLWYN, ERGLE ([id number]) Complaints and Symptoms: No Complaints or Symptoms Respiratory Medical History: Positive for: Chronic Obstructive Pulmonary Disease (COPD); Sleep Apnea Cardiovascular Complaints and Symptoms: Review of System Notes: stents heart vavle replacement Medical History: Positive for: Hypotension; Myocardial Infarction - 2006 Gastrointestinal Complaints and Symptoms: No Complaints or Symptoms Endocrine Medical History: Positive for: Type II Diabetes Time with diabetes: 6 yrs Treated with: Insulin, Oral agents Blood sugar tested every day: No Genitourinary Complaints and Symptoms: Review of System Notes: unable to urinate Immunological Complaints and Symptoms: No Complaints or Symptoms Musculoskeletal Medical History: Positive for: Osteoarthritis Neurologic Medical History: Positive for: Neuropathy VASILIS, LUHMAN ([id number]) Oncologic Complaints and Symptoms: No Complaints or Symptoms Psychiatric Complaints and Symptoms: No Complaints or Symptoms HBO Extended History Items Eyes: Cataracts Immunizations Pneumococcal Vaccine: Received Pneumococcal Vaccination: Yes Family and Social History Diabetes: Yes - Father; Former smoker - quit 15 yrs ago; Marital Status - Married; Alcohol Use: Rarely; Drug Use: No History; Caffeine Use:  Daily; Financial Concerns: No; Food, Clothing or Shelter Needs: No; Support System Lacking: No; Transportation Concerns: No; Advanced Directives: No; Patient does not want information on Advanced Directives; Do not resuscitate: No; Living Will: No; Medical Power of Attorney: No Electronic Signature(s) Signed: 01/04/2017 4:54:24 PM By: Alric Quan Signed: 01/04/2017 5:13:56 PM By: Linton Ham MD Entered By: Alric Quan on 01/04/2017 11:00:30 Andrew Lyons ([id number]) -------------------------------------------------------------------------------- SuperBill Details Andrew Lyons Date of Service: 01/04/2017 Patient Name: T. Patient Account Number: [id number] Medical Record Treating RN: [name] [id number] Number: Other Clinician: Date of Birth/Sex: Sep 20, 1946 (70 y.o. 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, right lower leg, S88.111D subsequent encounter Facility Procedures CPT4 Code: 02637858 Description: 85027 - WOUND CARE VISIT-LEV 3 EST PT Modifier: Quantity: 1 CPT4 Code: 74128786 Description: 76720 - DEB SUBQ TISSUE 20 SQ CM/< ICD-10 Description Diagnosis E11.622 Type 2 diabetes mellitus with other skin ulcer Modifier: Quantity: 1 Physician Procedures CPT4 Code Description: 9470962 83662 - WC PHYS LEVEL 4 - NEW PT ICD-10 Description Diagnosis 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

## 2017-01-07 NOTE — Telephone Encounter (Signed)
Scooter requires face to face visit. Change Invokana to Jardiance 25 mg daily.

## 2017-01-09 ENCOUNTER — Ambulatory Visit (INDEPENDENT_AMBULATORY_CARE_PROVIDER_SITE_OTHER): Payer: PPO | Admitting: Family Medicine

## 2017-01-09 ENCOUNTER — Encounter: Payer: Self-pay | Admitting: Family Medicine

## 2017-01-09 VITALS — BP 130/68 | HR 60 | Temp 97.9°F | Resp 16 | Ht 70.0 in | Wt 260.0 lb

## 2017-01-09 DIAGNOSIS — S88911S Complete traumatic amputation of right lower leg, level unspecified, sequela: Secondary | ICD-10-CM

## 2017-01-09 NOTE — Progress Notes (Signed)
Patient: Andrew Lyons Male    DOB: January 24, 1946   71 y.o.   MRN: [id number] Visit Date: 01/09/2017  Today's Provider: [name], MD   Chief Complaint  Patient presents with  . Follow-up   Subjective:    HPI Patient is here to get approved for a scooter. He has his lower right leg amputated, and this will help him get around.      Allergies  Allergen Reactions  . Iodinated Diagnostic Agents Rash    had a red chest -unusre of what kind of dye it was  . Zyban [Bupropion]   . Zocor [Simvastatin]      Current Outpatient Prescriptions:  .  amLODipine-benazepril (LOTREL) 5-40 MG capsule, Take 1 capsule by mouth daily., Disp: 90 capsule, Rfl: 3 .  aspirin 325 MG EC tablet, Take 325 mg by mouth daily., Disp: , Rfl:  .  co-enzyme Q-10 30 MG capsule, Take 100 mg by mouth daily., Disp: , Rfl:  .  fluticasone (FLONASE) 50 MCG/ACT nasal spray, USE 2 SPRAYS IN EACH NOSTRIL EVERY DAY, Disp: 48 g, Rfl: 12 .  INVOKAMET 150-500 MG TABS, TAKE 1 TABLET BY MOUTH TWICE A DAY, Disp: 90 tablet, Rfl: 1 .  metFORMIN (GLUCOPHAGE) 500 MG tablet, Take 1 tablet (500 mg total) by mouth 2 (two) times daily with a meal., Disp: 180 tablet, Rfl: 3 .  metoprolol succinate (TOPROL-XL) 100 MG 24 hr tablet, TAKE 1 TABLET BY MOUTH EVERY DAY, Disp: 90 tablet, Rfl: 3 .  mometasone (ELOCON) 0.1 % cream, Apply 1 application topically daily., Disp: 45 g, Rfl: 0 .  montelukast (SINGULAIR) 10 MG tablet, TAKE 1 TABLET BY MOUTH EVERY DAY, Disp: 90 tablet, Rfl: 3 .  omeprazole (PRILOSEC) 40 MG capsule, TAKE ONE CAPSULE BY MOUTH EVERY DAY, Disp: 90 capsule, Rfl: 3 .  rosuvastatin (CRESTOR) 20 MG tablet, TAKE 1 TABLET BY MOUTH EVERY DAY, Disp: 90 tablet, Rfl: 3 .  sildenafil (REVATIO) 20 MG tablet, Take 20 mg by mouth 3 (three) times daily., Disp: , Rfl:  .  tamsulosin (FLOMAX) 0.4 MG CAPS capsule, TAKE 1 CAPSULE (0.4 MG TOTAL) BY MOUTH DAILY., Disp: 90 capsule, Rfl: 3 .  tamsulosin (FLOMAX) 0.4 MG CAPS  capsule, Take 1 capsule (0.4 mg total) by mouth daily., Disp: 30 capsule, Rfl: 11 .  VICTOZA 18 MG/3ML SOPN, INJECT 0.6 SUBCUTANEOUSLY EVERY DAY, Disp: 9 pen, Rfl: 12 .  B-D UF III MINI PEN NEEDLES 31G X 5 MM MISC, USE WITH PEN DAILY, Disp: 90 each, Rfl: 3 .  canagliflozin (INVOKANA) 300 MG TABS tablet, Take 1 tablet (300 mg total) by mouth daily before breakfast. (Patient not taking: Reported on 01/09/2017), Disp: 90 tablet, Rfl: 3  Review of Systems  Constitutional: Negative.   HENT: Negative.   Eyes: Negative.   Respiratory: Negative.   Cardiovascular: Negative.   Musculoskeletal: Positive for arthralgias.  Allergic/Immunologic: Negative.   Neurological: Negative.   Psychiatric/Behavioral: Negative.     Social History  Substance Use Topics  . Smoking status: Former Research scientist (life sciences)  . Smokeless tobacco: Never Used     Comment: Quit smoking in 2003; Started smoking at age 31, smoked about 40 years, smoked over 3 packs per day  . Alcohol use Yes     Comment: Occcasional alcohol use   Objective:   BP 130/68 (BP Location: Right Arm, Patient Position: Sitting, Cuff Size: Normal)   Pulse 60   Temp 97.9 F (36.6 C)   Resp  16   Ht 5\' 10"  (1.778 m)   Wt 260 lb (117.9 kg)   BMI 37.31 kg/m  Vitals:   01/09/17 0925  BP: 130/68  Pulse: 60  Resp: 16  Temp: 97.9 F (36.6 C)  Weight: 260 lb (117.9 kg)  Height: 5\' 10"  (1.778 m)     Physical Exam  Constitutional: He is oriented to person, place, and time. He appears well-developed and well-nourished.  HENT:  Head: Normocephalic and atraumatic.  Right Ear: External ear normal.  Left Ear: External ear normal.  Nose: Nose normal.  Eyes: Conjunctivae are normal.  Neck: No thyromegaly present.  Cardiovascular: Normal rate, regular rhythm and normal heart sounds.   Pulmonary/Chest: Effort normal and breath sounds normal.  Abdominal: Soft.  Musculoskeletal:  Stump is presently breaking down  and being treated by would care.  Neurological:  He is alert and oriented to person, place, and time.  Skin: Skin is warm and dry.  Psychiatric: He has a normal mood and affect. His behavior is normal. Judgment and thought content normal.        Assessment & Plan:     1. Traumatic amputation of right lower extremity, sequela (HCC) Pt cannot ambulate without prosthesis.At 42 pt is not strong enough to use crutches or manual wheelchair. H would definitely benefit from a scooter.       I have done the exam and reviewed the above chart and it is accurate to the best of my knowledge. Development worker, community has been used in this note in any air is in the dictation or transcription are unintentional.  [name], MD  [place]

## 2017-01-09 NOTE — Telephone Encounter (Signed)
Patient has an appt today to discuss below.

## 2017-01-10 ENCOUNTER — Encounter: Payer: PPO | Admitting: Internal Medicine

## 2017-01-10 ENCOUNTER — Telehealth: Payer: Self-pay | Admitting: Family Medicine

## 2017-01-10 DIAGNOSIS — S81001A Unspecified open wound, right knee, initial encounter: Secondary | ICD-10-CM | POA: Diagnosis not present

## 2017-01-10 DIAGNOSIS — E11622 Type 2 diabetes mellitus with other skin ulcer: Secondary | ICD-10-CM | POA: Diagnosis not present

## 2017-01-10 NOTE — Telephone Encounter (Signed)
Pt states his diabetes medication was changed yesterday and the pharmacy has not rec'd the Rx.  Please advise.  CB#[phone]/MW

## 2017-01-10 NOTE — Telephone Encounter (Signed)
Please review. Note does not mention this-aa

## 2017-01-12 NOTE — Telephone Encounter (Signed)
Jardiance or Farziga--full dose.

## 2017-01-12 NOTE — Progress Notes (Signed)
ARRAN, FESSEL ([id number]) Visit Report for 01/10/2017 Chief Complaint Document Details Andrew Lyons Date of Service: 01/10/2017 12:30 PM Patient Name: T. Patient Account Number: [id number] Medical Record Treating RN: Baruch Gouty RN, BSN, Velva Harman [id number] Number: Other Clinician: Date of Birth/Sex: 1946-06-28 (71 y.o. Male) Treating Linton Ham Primary Care Provider: [name] Provider/Extender: G Referring Provider: [name] in Treatment: 0 Information Obtained from: Patient Chief Complaint 01/04/17; patient is here for review of a nonhealing wound on his right leg below-knee amputation site. Electronic Signature(s) Signed: 01/11/2017 7:57:33 AM By: Linton Ham MD Entered By: Linton Ham on 01/10/2017 13:41:52 Andrew Lyons ([id number]) -------------------------------------------------------------------------------- Debridement Details Andrew Lyons Date of Service: 01/10/2017 12:30 PM Patient Name: T. Patient Account Number: [id number] Medical Record Treating RN: Baruch Gouty RN, BSN, Velva Harman [id number] Number: Other Clinician: Date of Birth/Sex: [birth year] (71 y.o. Male) Treating Lizbet Cirrincione, [place] Primary Care Provider: [name], Delfino Lovett Provider/Extender: G Referring Provider: [name], [name] Weeks in Treatment: 0 Debridement Performed for Wound #1 Right Amputation Site - Below Knee Assessment: Performed By: Physician [name], MD Debridement: Debridement Pre-procedure Yes - 13:09 Verification/Time Out Taken: Start Time: 13:09 Pain Control: Lidocaine 4% Topical Solution Level: Skin/Subcutaneous Tissue Total Area Debrided (L x 0.5 (cm) x 0.8 (cm) = 0.4 (cm) W): Tissue and other Non-Viable, Fibrin/Slough, Subcutaneous material debrided: Instrument: Curette Bleeding: Minimum Hemostasis Achieved: Pressure End Time: 13:10 Procedural Pain: 0 Post Procedural Pain: 0 Response to Treatment: Procedure was tolerated  well Post Debridement Measurements of Total Wound Length: (cm) 0.5 Width: (cm) 0.8 Depth: (cm) 0.7 Volume: (cm) 0.22 Character of Wound/Ulcer Post Stable Debridement: Severity of Tissue Post Debridement: Fat layer exposed Post Procedure Diagnosis Same as Pre-procedure Electronic Signature(s) Signed: 01/10/2017 4:51:03 PM By: Regan Lemming BSN, RN Signed: 01/11/2017 7:57:33 AM By: Linton Ham MD Andrew Lyons ([id number]) Entered By: Linton Ham on 01/10/2017 13:41:42 Andrew Lyons ([id number]) -------------------------------------------------------------------------------- HPI Details Andrew Lyons Date of Service: 01/10/2017 12:30 PM Patient Name: T. Patient Account Number: [id number] Medical Record Treating RN: Baruch Gouty RN, BSN, Velva Harman [id number] Number: Other Clinician: Date of Birth/Sex: 1946/04/27 (71 y.o. Male) Treating Linton Ham Primary Care Provider: [name] Provider/Extender: G Referring Provider: [name] in Treatment: 0 History of Present Illness HPI Description: 01/04/17; this is a 71 year old diabetic man who has a remote history of a traumatic lower extremity damage at age 75 requiring an amputation. He tells me spent 2 years walking on crutches then ultimately has been walking on prosthesis without any trouble since then. Several months ago he had a new prosthesis and developed an open area in the right stump in December. He saw podiatry Dr. Prudence Davidson who noted that this was really out of his practice jurisdiction and referred him here. He has been using mupirocin. He has continued to walk on the prosthesis. The prosthesis itself as been adjusted by the prostatitis. He does not have a known arterial issue. He tells me he probably has diabetic neuropathy. He had a wound that took a long time to heal surrounding the actual amputation itself however is not had more recent wounds. 01/10/17 Culture from last week grew  pseudomonas. change AB to cefdinir yesterday from doxy. he is not using his prosthesis which I emphasized although he is having it adjusted next week Electronic Signature(s) Signed: 01/11/2017 7:57:33 AM By: Linton Ham MD Entered By: Linton Ham on 01/10/2017 13:43:27 Andrew Lyons ([id number]) -------------------------------------------------------------------------------- Physical Exam Details Andrew Lyons Date of Service: 01/10/2017 12:30 PM Patient  Name: T. Patient Account Number: [id number] Medical Record Treating RN: Baruch Gouty RN, BSN, Velva Harman [id number] Number: Other Clinician: Date of Birth/Sex: 24-Jul-1946 (71 y.o. Male) Treating Linton Ham Primary Care Provider: [name] Provider/Extender: G Referring Provider: [name], [name] Weeks in Treatment: 0 Constitutional Sitting or standing Blood Pressure is within target range for patient.. Pulse regular and within target range for patient.Marland Kitchen Respirations regular, non-labored and within target range.. Temperature is normal and within the target range for the patient.. Patient's appearance is neat and clean. Appears in no acute distress. Well nourished and well developed.. Cardiovascular palpable pop and fem but perhaps reduced. Lymphatic none palpable in the popliteal or inguinal area. Integumentary (Hair, Skin) erythema around the owund is better. Notes Wound exam; the wound is a small punched out area. using a number 3 curette we removed necrotic material. the base of the wound looks healthy post debridement. The worrisome erythema medically from last week is improved opressure area vs infection. No soft tissue crepitus Electronic Signature(s) Signed: 01/11/2017 7:57:33 AM By: Linton Ham MD Entered By: Linton Ham on 01/10/2017 13:46:45 Andrew Lyons ([id number]) -------------------------------------------------------------------------------- Physician Orders Details Andrew Lyons Date of Service: 01/10/2017 12:30 PM Patient Name: T. Patient Account Number: [id number] Medical Record Treating RN: Baruch Gouty RN, BSN, Velva Harman [id number] Number: Other Clinician: Date of Birth/Sex: 1946/03/22 (71 y.o. Male) Treating Linton Ham Primary Care Provider: [name] Provider/Extender: G Referring Provider: [name], [name] Weeks in Treatment: 0 Verbal / Phone Orders: No Diagnosis Coding Wound Cleansing Wound #1 Right Amputation Site - Below Knee o Clean wound with Normal Saline. o Cleanse wound with mild soap and water Anesthetic Wound #1 Right Amputation Site - Below Knee o Topical Lidocaine 4% cream applied to wound bed prior to debridement Skin Barriers/Peri-Wound Care Wound #1 Right Amputation Site - Below Knee o Skin Prep Primary Wound Dressing Wound #1 Right Amputation Site - Below Knee o Aquacel Ag - rope Secondary Dressing Wound #1 Right Amputation Site - Below Knee o Boardered Foam Dressing Dressing Change Frequency Wound #1 Right Amputation Site - Below Knee o Change dressing every day. Follow-up Appointments Wound #1 Right Amputation Site - Below Knee o Return Appointment in 1 week. Edema Control Wound #1 Right Amputation Site - Below Knee o Elevate legs to the level of the heart and pump ankles as often as possible Haegele, Rui T. ([id number]) Off-Loading o Other: - do not wear your prosthesis Additional Orders / Instructions Wound #1 Right Amputation Site - Below Knee o Increase protein intake. Medications-please add to medication list. Wound #1 Right Amputation Site - Below Knee o Other: - vitamin C, zinc, MVI Electronic Signature(s) Signed: 01/10/2017 4:51:03 PM By: Regan Lemming BSN, RN Signed: 01/11/2017 7:57:33 AM By: Linton Ham MD Entered By: Regan Lemming on 01/10/2017 13:14:27 Andrew Lyons  ([id number]) -------------------------------------------------------------------------------- Problem List Details Andrew Lyons Date of Service: 01/10/2017 12:30 PM Patient Name: T. Patient Account Number: [id number] Medical Record Treating RN: Baruch Gouty RN, BSN, Velva Harman [id number] Number: Other Clinician: Date of Birth/Sex: [birth year] (71 y.o. 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 Patient Account Number: [id number] Medical Record Treating RN: Baruch Gouty RN, BSN, Velva Harman [id number] Number: Other Clinician: Date of Birth/Sex: 1946/01/07 (71 y.o. Male) Treating Linton Ham Primary Care Provider: [name] Provider/Extender: G Referring Provider: [name] in Treatment: 0 Subjective Chief Complaint Information obtained from Patient 01/04/17; patient is here for review of a nonhealing wound on his right leg below-knee amputation site. History of Present Illness (HPI) 01/04/17; this is a 71 year old diabetic man who has a remote history of a traumatic lower extremity damage at age 41  requiring an amputation. He tells me spent 2 years walking on crutches then ultimately has been walking on prosthesis without any trouble since then. Several months ago he had a new prosthesis and developed an open area in the right stump in December. He saw podiatry Dr. Prudence Davidson who noted that this was really out of his practice jurisdiction and referred him here. He has been using mupirocin. He has continued to walk on the prosthesis. The prosthesis itself as been adjusted by the prostatitis. He does not have a known arterial issue. He tells me he probably has diabetic neuropathy. He had a wound that took a long time to heal surrounding the actual amputation itself however is not had more recent wounds. 01/10/17 Culture from last week grew pseudomonas. change AB to cefdinir yesterday from doxy. he is not using his prosthesis which I emphasized although he is having it adjusted next week Objective Constitutional Sitting or standing Blood Pressure is within target range for patient.. Pulse regular and within target range for patient.Marland Kitchen Respirations regular, non-labored and within target range.. Temperature is normal and within the target range for the patient.. Patient's appearance is neat and clean. Appears in no acute distress. Well nourished and well developed.. Vitals Time Taken: 12:40 PM, Height: 70 in, Weight: 266.5 lbs, BMI: 38.2, Temperature: 97.5 F, Pulse: 61 bpm, Respiratory Rate: 18 breaths/min, Blood Pressure: 137/58 mmHg. Cardiovascular GARL, SPEIGNER ([id number]) palpable pop and fem but perhaps reduced. Lymphatic none palpable in the popliteal or inguinal area. General Notes: Wound exam; the wound is a small punched out area. using a number 3 curette we removed necrotic material. the base of the wound looks healthy post debridement. The worrisome erythema medically from last week is improved opressure area vs infection. No soft tissue crepitus Integumentary (Hair,  Skin) erythema around the owund is better. Wound #1 status is Open. Original cause of wound was Gradually Appeared. 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 The wound measures 0.5cm length x 0.8cm width x 0.7cm depth; 0.314cm^2 area and 0.22cm^3 volume. The wound is limited to skin breakdown. There is no tunneling or undermining noted. There is a large amount of serous drainage noted. The wound margin is distinct with the outline attached to the wound base. There is no granulation within the wound bed. There is a large (67- 100%) amount of necrotic tissue within the wound bed including Adherent Slough. Periwound temperature was noted as No Abnormality. The periwound has tenderness on palpation. 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 There was a Skin/Subcutaneous Tissue Debridement ([id number]) debridement with total area of 0.4 sq cm performed by [name], MD. with the following instrument(s): Curette to remove Non-Viable tissue/material including Fibrin/Slough and Subcutaneous after achieving pain control using Lidocaine 4% Topical Solution. A time out was conducted at 13:09, prior to the start of the procedure. A Minimum amount of bleeding was controlled with Pressure. The procedure was tolerated well with a pain level of 0 throughout and a pain level of 0 following the procedure. Post Debridement Measurements: 0.5cm length x 0.8cm width Jerome, Charels T. ([id number]) x 0.7cm depth; 0.22cm^3 volume. Character of Wound/Ulcer Post Debridement is stable. Severity of Tissue Post  Debridement is: Fat layer exposed. Post procedure Diagnosis Wound #1: Same as Pre-Procedure Plan Wound Cleansing: Wound #1 Right Amputation Site - Below Knee: Clean wound with Normal Saline. Cleanse wound with mild soap and water Anesthetic: Wound #1 Right Amputation Site - Below Knee: Topical Lidocaine 4% cream applied to wound bed prior to debridement Skin Barriers/Peri-Wound Care: Wound #1 Right Amputation Site - Below Knee: Skin Prep Primary Wound Dressing: Wound #1 Right Amputation Site - Below Knee: Aquacel Ag - rope Secondary Dressing: Wound #1 Right Amputation Site - Below Knee: Boardered Foam Dressing Dressing Change Frequency: Wound #1 Right Amputation Site - Below Knee: Change dressing every day. Follow-up Appointments: Wound #1 Right Amputation Site - Below Knee: Return Appointment in 1 week. Edema Control: Wound #1 Right Amputation Site - Below Knee: Elevate legs to the level of the heart and pump ankles as often as possible Off-Loading: Other: - do not wear your prosthesis Additional Orders / Instructions: Wound #1 Right Amputation Site - Below Knee: Increase protein intake. Medications-please add to medication list.: Wound #1 Right Amputation Site - Below Knee: Other: - vitamin C, zinc, MVI XAYVIER, VALLEZ. ([id number]) -wound appears better -no overt surrounding infection -contnuie cefdinir started yesterday for 10 days pseudomonas -consider oasis -wife asked about HBO, no evidence of deep infection xray negative Electronic Signature(s) Signed: 01/11/2017 7:57:33 AM By: Linton Ham MD Entered By: Linton Ham on 01/10/2017 13:48:10 Andrew Lyons ([id number]) -------------------------------------------------------------------------------- SuperBill Details Andrew Lyons Date of Service: 01/10/2017 Patient Name: T. Patient Account Number: [id number] Medical Record Treating RN: Baruch Gouty RN, BSN, Velva Harman [id number] Number: Other  Clinician: Date of Birth/Sex: August 31, 1946 (71 y.o. 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, right lower leg, S88.111D subsequent encounter L03.115 Cellulitis of right lower limb Facility Procedures CPT4 Code Description: 62035597 11042 - DEB SUBQ TISSUE 20 SQ CM/< ICD-10 Description Diagnosis E11.622 Type 2 diabetes mellitus with other skin ulcer L97.313 Non-pressure chronic ulcer of right ankle with necr Modifier: osis of musc Quantity: 1 le Physician Procedures CPT4 Code Description: 4163845 36468 - WC PHYS SUBQ TISS 20 SQ CM ICD-10 Description Diagnosis E11.622 Type 2 diabetes mellitus with other skin ulcer L97.313 Non-pressure chronic ulcer of right ankle with necr Modifier: osis of musc Quantity: 1 le Electronic Signature(s) Signed: 01/11/2017 7:57:33 AM By: Linton Ham MD Entered By: Linton Ham on 01/10/2017 13:48:53

## 2017-01-12 NOTE — Progress Notes (Signed)
FREEMAN, BORBA ([id number]) Visit Report for 01/10/2017 Arrival Information Details Patient Name: Andrew Lyons, Andrew Lyons Date of Service: 01/10/2017 12:30 PM Medical Record Number: [id number] Patient Account Number: [id number] Date of Birth/Sex: 1945-12-22 (71 y.o. Male) Treating RN: Baruch Gouty, RN, BSN, Velva Harman Primary Care Olander Friedl: [name], Delfino Lovett Other Clinician: Referring Mariusz Jubb: [name], [name] Treating Deadrick Stidd/Extender: [name] in Treatment: 0 Visit Information History Since Last Visit All ordered tests and consults were completed: No Patient Arrived: Crutches Added or deleted any medications: No Arrival Time: 12:38 Any new allergies or adverse reactions: No Accompanied By: wife Had a fall or experienced change in No Transfer Assistance: None activities of daily living that may affect Patient Identification Verified: Yes risk of falls: Secondary Verification Process Yes Signs or symptoms of abuse/neglect since last No Completed: visito Patient Requires Transmission-Based No Hospitalized since last visit: No Precautions: Has Dressing in Place as Prescribed: Yes Patient Has Alerts: Yes Pain Present Now: No Patient Alerts: DM II Electronic Signature(s) Signed: 01/10/2017 4:51:03 PM By: Regan Lemming BSN, RN Entered By: Regan Lemming on 01/10/2017 12:40:29 Andrew Lyons ([id number]) -------------------------------------------------------------------------------- Encounter Discharge Information Details Patient Name: Andrew Lyons Date of Service: 01/10/2017 12:30 PM Medical Record Number: [id number] Patient Account Number: [id number] Date of Birth/Sex: Nov 10, 1945 (71 y.o. Male) Treating RN: Baruch Gouty, RN, BSN, Velva Harman Primary Care Trelon Plush: [name], Delfino Lovett Other Clinician: Referring Sirus Labrie: [name] Treating Mykhia Danish/Extender: [name] in Treatment: 0 Encounter Discharge Information Items Discharge Pain Level:  0 Discharge Condition: Stable Ambulatory Status: Crutches Discharge Destination: Home Transportation: Private Auto Accompanied By: wife Schedule Follow-up Appointment: No Medication Reconciliation completed No and provided to Patient/Care Ashaunti Treptow: Provided on Clinical Summary of Care: 01/10/2017 Form Type Recipient Paper Patient KA Electronic Signature(s) Signed: 01/10/2017 1:21:13 PM By: Ruthine Dose Entered By: Ruthine Dose on 01/10/2017 13:21:13 Andrew Lyons ([id number]) -------------------------------------------------------------------------------- Lower Extremity Assessment Details Patient Name: Andrew Lyons Date of Service: 01/10/2017 12:30 PM Medical Record Number: [id number] Patient Account Number: [id number] Date of Birth/Sex: 08-01-1946 (71 y.o. Male) Treating RN: Baruch Gouty, RN, BSN, [place] Primary Care Evalena Fujii: [name], Delfino Lovett Other Clinician: Referring Sahily Biddle: [name], [name] Treating Deaira Leckey/Extender: [name] in Treatment: 0 Electronic Signature(s) Signed: 01/10/2017 4:51:03 PM By: Regan Lemming BSN, RN Entered By: Regan Lemming on 01/10/2017 12:41:41 Andrew Lyons ([id number]) -------------------------------------------------------------------------------- Multi Wound Chart Details Patient Name: Andrew Lyons Date of Service: 01/10/2017 12:30 PM Medical Record Number: [id number] Patient Account Number: [id number] Date of Birth/Sex: 1946-06-09 (71 y.o. Male) Treating RN: Baruch Gouty, RN, BSN, Velva Harman Primary Care Tajah Noguchi: [name], Delfino Lovett Other Clinician: Referring Azim Gillingham: [name], [name] Treating Huda Petrey/Extender: [name] in Treatment: 0 Vital Signs Height(in): 70 Pulse(bpm): 61 Weight(lbs): 266.5 Blood Pressure 137/58 (mmHg): Body Mass Index(BMI): 38 Temperature(F): 97.5 Respiratory Rate 18 (breaths/min): Photos: [1:No Photos] [N/A:N/A] Wound Location: [1:Right Amputation Site - Below  Knee] [N/A:N/A] Wounding Event: [1:Gradually Appeared] [N/A:N/A] Primary Etiology: [1:Atypical] [N/A:N/A] Comorbid History: [1:Cataracts, Chronic Obstructive Pulmonary Disease (COPD), Sleep Apnea, Hypotension, Myocardial Infarction, Type II Diabetes, Osteoarthritis, Neuropathy] [N/A:N/A] Date Acquired: [1:08/26/2016] [N/A:N/A] Weeks of Treatment: [1:0] [N/A:N/A] Wound Status: [1:Open] [N/A:N/A] Measurements L x W x D 0.5x0.8x0.7 [N/A:N/A] (cm) Area (cm) : [1:0.314] [N/A:N/A] Volume (cm) : [1:0.22] [N/A:N/A] % Reduction in Area: [1:-297.50%] [N/A:N/A] % Reduction in Volume: -209.90% [N/A:N/A] Classification: [1:Partial Thickness] [N/A:N/A] HBO Classification: [1:Grade 1] [N/A:N/A] Exudate Amount: [1:Large] [N/A:N/A] Exudate Type: [1:Serous] [N/A:N/A] Exudate Color: [1:amber] [N/A:N/A] Wound Margin: [1:Distinct, outline attached N/A] Granulation Amount: [1:None Present (0%)] [  N/A:N/A] Necrotic Amount: [1:Large (67-100%)] [N/A:N/A] Exposed Structures: [N/A:N/A] Fascia: No Fat Layer (Subcutaneous Tissue) Exposed: No Tendon: No Muscle: No Joint: No Bone: No Limited to Skin Breakdown Epithelialization: None N/A N/A Debridement: Debridement ([id number]- N/A N/A 11047) Pre-procedure 13:09 N/A N/A Verification/Time Out Taken: Pain Control: Lidocaine 4% Topical N/A N/A Solution Tissue Debrided: Fibrin/Slough, N/A N/A Subcutaneous Level: Skin/Subcutaneous N/A N/A Tissue Debridement Area (sq 0.4 N/A N/A cm): Instrument: Curette N/A N/A Bleeding: Minimum N/A N/A Hemostasis Achieved: Pressure N/A N/A Procedural Pain: 0 N/A N/A Post Procedural Pain: 0 N/A N/A Debridement Treatment Procedure was tolerated N/A N/A Response: well Post Debridement 0.5x0.8x0.7 N/A N/A Measurements L x W x D (cm) Post Debridement 0.22 N/A N/A Volume: (cm) Periwound Skin Texture: No Abnormalities Noted N/A N/A Periwound Skin No Abnormalities Noted N/A N/A Moisture: Periwound Skin Color: No  Abnormalities Noted N/A N/A Temperature: No Abnormality N/A N/A Tenderness on Yes N/A N/A Palpation: Wound Preparation: Ulcer Cleansing: N/A N/A Rinsed/Irrigated with Saline Topical Anesthetic Applied: Other: lidocaine 4% Procedures Performed: Debridement N/A N/A Andrew Lyons, Andrew Lyons ([id number]) Treatment Notes Wound #1 (Right Amputation Site - Below Knee) 1. Cleansed with: Clean wound with Normal Saline 4. Dressing Applied: Aquacel Ag 5. Secondary Dressing Applied Bordered Foam Dressing Electronic Signature(s) Signed: 01/11/2017 7:57:33 AM By: Linton Ham MD Entered By: Linton Ham on 01/10/2017 13:41:27 Andrew Lyons ([id number]) -------------------------------------------------------------------------------- [place] Details Patient Name: Andrew Lyons Date of Service: 01/10/2017 12:30 PM Medical Record Number: [id number] Patient Account Number: [id number] Date of Birth/Sex: 03-11-1946 (71 y.o. Male) Treating RN: Baruch Gouty, RN, BSN, Velva Harman Primary Care Kelaiah Escalona: [name], Delfino Lovett Other Clinician: Referring Yukio Bisping: [name] Treating Shawnelle Spoerl/Extender: [name] in Treatment: 0 Active Inactive ` Abuse / Safety / Falls / Self Care Management Nursing Diagnoses: Potential for falls Goals: Patient will remain injury free Date Initiated: 01/04/2017 Target Resolution Date: 04/01/2017 Goal Status: Active Interventions: Assess fall risk on admission and as needed Assess self care needs on admission and as needed Notes: ` Orientation to the Wound Care Program Nursing Diagnoses: Knowledge deficit related to the wound healing center program Goals: Patient/caregiver will verbalize understanding of the [place] Program Date Initiated: 01/04/2017 Target Resolution Date: 01/28/2017 Goal Status: Active Interventions: Provide education on orientation to the wound center Notes: ` Wound/Skin  Impairment Nursing Diagnoses: Impaired tissue integrity Andrew Lyons, Andrew Lyons ([id number]) Knowledge deficit related to ulceration/compromised skin integrity Goals: Ulcer/skin breakdown will have a volume reduction of 80% by week 12 Date Initiated: 01/04/2017 Target Resolution Date: 04/22/2017 Goal Status: Active Interventions: Assess patient/caregiver ability to perform ulcer/skin care regimen upon admission and as needed Assess ulceration(s) every visit Notes: Electronic Signature(s) Signed: 01/10/2017 4:51:03 PM By: Regan Lemming BSN, RN Entered By: Regan Lemming on 01/10/2017 12:46:30 Andrew Lyons ([id number]) -------------------------------------------------------------------------------- Pain Assessment Details Patient Name: Andrew Lyons Date of Service: 01/10/2017 12:30 PM Medical Record Number: [id number] Patient Account Number: [id number] Date of Birth/Sex: 1945/11/18 (71 y.o. Male) Treating RN: Baruch Gouty, RN, BSN, [place] Primary Care Sativa Gelles: [name] Other Clinician: Referring Sem Mccaughey: [name] Treating Afomia Blackley/Extender: [name] in Treatment: 0 Active Problems Location of Pain Severity and Description of Pain Patient Has Paino No Site Locations With Dressing Change: No Pain Management and Medication Current Pain Management: Electronic Signature(s) Signed: 01/10/2017 4:51:03 PM By: Regan Lemming BSN, RN Entered By: Regan Lemming on 01/10/2017 12:40:38 Andrew Lyons ([id number]) -------------------------------------------------------------------------------- Patient/Caregiver Education Details [name] Date of Service: 01/10/2017 12:30 PM Patient Name:  T. Patient Account Number: [id number] Medical Record Treating RN: Baruch Gouty, RN, BSN, Velva Harman [id number] Number: Other Clinician: Date of Birth/Gender: 20-Nov-1945 (70 y.o. Male) Treating ROBSON, [place] Primary Care Physician: [name], Delfino Lovett Physician/Extender:  G Referring Physician: [name] in Treatment: 0 Education Assessment Education Provided To: Patient Education Topics Provided Basic Hygiene: Methods: Explain/Verbal Responses: State content correctly Welcome To [facility] [place]: Methods: Explain/Verbal Responses: State content correctly Wound Debridement: Methods: Explain/Verbal Responses: State content correctly Wound/Skin Impairment: Methods: Explain/Verbal Responses: State content correctly Electronic Signature(s) Signed: 01/10/2017 4:51:03 PM By: Regan Lemming BSN, RN Entered By: Regan Lemming on 01/10/2017 13:15:53 Andrew Lyons ([id number]) -------------------------------------------------------------------------------- Wound Assessment Details Patient Name: Andrew Lyons Date of Service: 01/10/2017 12:30 PM Medical Record Number: [id number] Patient Account Number: [id number] Date of Birth/Sex: Mar 10, 1946 (71 y.o. Male) Treating RN: Baruch Gouty, RN, BSN, [place] Primary Care Bray Vickerman: [name], Delfino Lovett Other Clinician: Referring Allan Minotti: [name] Treating Louna Rothgeb/Extender: [name] in Treatment: 0 Wound Status Wound Number: 1 Primary Atypical Etiology: Wound Location: Right Amputation Site - Below Knee Wound Open Status: Wounding Event: Gradually Appeared Comorbid Cataracts, Chronic Obstructive Date Acquired: 08/26/2016 History: Pulmonary Disease (COPD), Sleep Weeks Of Treatment: 0 Apnea, Hypotension, Myocardial Clustered Wound: No Infarction, Type II Diabetes, Osteoarthritis, Neuropathy Photos Photo Uploaded By: Regan Lemming on 01/10/2017 17:04:01 Wound Measurements Length: (cm) 0.5 Width: (cm) 0.8 Depth: (cm) 0.7 Area: (cm) 0.314 Volume: (cm) 0.22 % Reduction in Area: -297.5% % Reduction in Volume: -209.9% Epithelialization: None Tunneling: No Undermining: No Wound Description Classification: Partial Thickness Foul O Diabetic Severity (Wagner):  Grade 1 Slough Wound Margin: Distinct, outline attached Exudate Amount: Large Exudate Type: Serous Exudate Color: amber dor After Cleansing: No /Fibrino Yes Wound Bed Granulation Amount: None Present (0%) Exposed Structure Andrew Lyons, Andrew Lyons ([id number]) Necrotic Amount: Large (67-100%) Fascia Exposed: No Necrotic Quality: Adherent Slough Fat Layer (Subcutaneous Tissue) Exposed: No Tendon Exposed: No Muscle Exposed: No Joint Exposed: No Bone Exposed: No Limited to Skin Breakdown Periwound Skin Texture Texture Color No Abnormalities Noted: No No Abnormalities Noted: No Moisture Temperature / Pain No Abnormalities Noted: No Temperature: No Abnormality Tenderness on Palpation: Yes Wound Preparation Ulcer Cleansing: Rinsed/Irrigated with Saline Topical Anesthetic Applied: Other: lidocaine 4%, Treatment Notes Wound #1 (Right Amputation Site - Below Knee) 1. Cleansed with: Clean wound with Normal Saline 4. Dressing Applied: Aquacel Ag 5. Secondary Dressing Applied Bordered Foam Dressing Electronic Signature(s) Signed: 01/10/2017 4:51:03 PM By: Regan Lemming BSN, RN Entered By: Regan Lemming on 01/10/2017 12:46:15 Andrew Lyons ([id number]) -------------------------------------------------------------------------------- Vitals Details Patient Name: Andrew Lyons Date of Service: 01/10/2017 12:30 PM Medical Record Number: [id number] Patient Account Number: [id number] Date of Birth/Sex: 05-19-1946 (71 y.o. Male) Treating RN: Afful, RN, BSN, [place] Primary Care Javonte Elenes: [name], Delfino Lovett Other Clinician: Referring Angalena Cousineau: [name] Treating Alva Kuenzel/Extender: [name] in Treatment: 0 Vital Signs Time Taken: 12:40 Temperature (F): 97.5 Height (in): 70 Pulse (bpm): 61 Weight (lbs): 266.5 Respiratory Rate (breaths/min): 18 Body Mass Index (BMI): 38.2 Blood Pressure (mmHg): 137/58 Reference Range: 80 - 120 mg / dl Electronic  Signature(s) Signed: 01/10/2017 4:51:03 PM By: Regan Lemming BSN, RN Entered By: Regan Lemming on 01/10/2017 12:41:14

## 2017-01-13 ENCOUNTER — Other Ambulatory Visit: Payer: Self-pay

## 2017-01-13 MED ORDER — EMPAGLIFLOZIN 25 MG PO TABS: 25.0000 mg | ORAL_TABLET | Freq: Every day | ORAL | 12 refills | Status: DC

## 2017-01-13 NOTE — Telephone Encounter (Signed)
Jardiance sent in and pt advised-aa

## 2017-01-17 ENCOUNTER — Encounter: Payer: PPO | Admitting: Internal Medicine

## 2017-01-17 DIAGNOSIS — S81001A Unspecified open wound, right knee, initial encounter: Secondary | ICD-10-CM | POA: Diagnosis not present

## 2017-01-17 DIAGNOSIS — E11622 Type 2 diabetes mellitus with other skin ulcer: Secondary | ICD-10-CM | POA: Diagnosis not present

## 2017-01-17 DIAGNOSIS — T8189XA Other complications of procedures, not elsewhere classified, initial encounter: Secondary | ICD-10-CM | POA: Diagnosis not present

## 2017-01-18 ENCOUNTER — Other Ambulatory Visit: Payer: Self-pay | Admitting: Family Medicine

## 2017-01-18 NOTE — Progress Notes (Signed)
POSEIDON, PAM ([id number]) Visit Report for 01/17/2017 Arrival Information Details Patient Name: Andrew Lyons, Andrew Lyons Date of Service: 01/17/2017 12:30 PM Medical Record Number: [id number] Patient Account Number: [id number] Date of Birth/Sex: [birth year] (71 y.o. Male) Treating RN: [name] Primary Care Shelonda Saxe: [name], Delfino Lovett Other Clinician: Referring Jaquita Bessire: [name], [name] Treating Luara Faye/Extender: [name] in Treatment: 1 Visit Information History Since Last Visit All ordered tests and consults were completed: No Patient Arrived: Other Added or deleted any medications: No Arrival Time: 12:37 Any new allergies or adverse reactions: No Accompanied By: daughter Had a fall or experienced change in No Transfer Assistance: EasyP[employer] activities of daily living that may affect Patient Lift risk of falls: Patient Identification Verified: Yes Signs or symptoms of abuse/neglect since last No Secondary Verification Process Yes visito Completed: Hospitalized since last visit: No Patient Requires Transmission- No Has Dressing in Place as Prescribed: Yes Based Precautions: Pain Present Now: No Patient Has Alerts: Yes Patient Alerts: DM II Electronic Signature(s) Signed: 01/17/2017 4:19:56 PM By: Alric Quan Entered By: Alric Quan on 01/17/2017 12:40:23 Andrew Lyons ([id number]) -------------------------------------------------------------------------------- Encounter Discharge Information Details Patient Name: Andrew Lyons Date of Service: 01/17/2017 12:30 PM Medical Record Number: [id number] Patient Account Number: [id number] Date of Birth/Sex: 1946/07/11 (71 y.o. Male) Treating RN: [name] Primary Care Marvena Tally: [name], Delfino Lovett Other Clinician: Referring Amron Guerrette: [name], [name] Treating Lattie Riege/Extender: [name] in Treatment: 1 Encounter Discharge Information Items Discharge Pain  Level: 0 Discharge Condition: Stable Ambulatory Status: Walker Discharge Destination: Home Transportation: Private Auto Accompanied By: daughter Schedule Follow-up Appointment: Yes Medication Reconciliation completed No and provided to Patient/Care Yanilen Adamik: Provided on Clinical Summary of Care: 01/17/2017 Form Type Recipient Paper Patient KA Electronic Signature(s) Signed: 01/17/2017 4:19:56 PM By: Alric Quan Previous Signature: 01/17/2017 1:16:11 PM Version By: Ruthine Dose Entered By: Alric Quan on 01/17/2017 13:20:35 Andrew Lyons ([id number]) -------------------------------------------------------------------------------- General Visit Notes Details Patient Name: Andrew Lyons Date of Service: 01/17/2017 12:30 PM Medical Record Number: [id number] Patient Account Number: [id number] Date of Birth/Sex: [birth year] (71 y.o. Male) Treating RN: [name] Primary Care Brycen Bean: [name], Delfino Lovett Other Clinician: Referring Conception Doebler: [name], [name] Treating Kiana Hollar/Extender: [name] in Treatment: 1 Notes gave pt blood sugar monitoring sheet. Electronic Signature(s) Signed: 01/17/2017 4:19:56 PM By: Alric Quan Entered By: Alric Quan on 01/17/2017 12:53:42 Andrew Lyons ([id number]) -------------------------------------------------------------------------------- Lower Extremity Assessment Details Patient Name: Andrew Lyons Date of Service: 01/17/2017 12:30 PM Medical Record Number: [id number] Patient Account Number: [id number] Date of Birth/Sex: 18-Feb-1946 (70 y.o. Male) Treating RN: [name] Primary Care Conan Mcmanaway: [name], Delfino Lovett Other Clinician: Referring Doyle Tegethoff: [name], [name] Treating Jahaziel Francois/Extender: [name] in Treatment: 1 Electronic Signature(s) Signed: 01/17/2017 4:19:56 PM By: Alric Quan Entered By: Alric Quan on 01/17/2017 13:19:35 Andrew Lyons ([id number]) -------------------------------------------------------------------------------- Multi Wound Chart Details Patient Name: Andrew Lyons Date of Service: 01/17/2017 12:30 PM Medical Record Number: [id number] Patient Account Number: [id number] Date of Birth/Sex: November 15, 1945 (71 y.o. Male) Treating RN: [name] Primary Care Jefferson Fullam: [name] Other Clinician: Referring Karrine Kluttz: [name], [name] Treating Kameran Mcneese/Extender: [name] in Treatment: 1 Vital Signs Height(in): 70 Pulse(bpm): 59 Weight(lbs): 266.5 Blood Pressure 133/60 (mmHg): Body Mass Index(BMI): 38 Temperature(F): 97.6 Respiratory Rate 18 (breaths/min): Photos: [1:No Photos] [N/A:N/A] Wound Location: [1:Right Amputation Site - Below Knee] [N/A:N/A] Wounding Event: [1:Gradually Appeared] [N/A:N/A] Primary Etiology: [1:Atypical] [N/A:N/A] Comorbid History: [1:Cataracts, Chronic Obstructive Pulmonary Disease (COPD), Sleep Apnea, Hypotension,  Myocardial Infarction, Type II Diabetes, Osteoarthritis, Neuropathy] [N/A:N/A] Date Acquired: [1:08/26/2016] [N/A:N/A] Weeks of Treatment: [1:1] [N/A:N/A] Wound Status: [1:Open] [N/A:N/A] Measurements L x W x D 0.4x0.5x0.9 [N/A:N/A] (cm) Area (cm) : [1:0.157] [N/A:N/A] Volume (cm) : [1:0.141] [N/A:N/A] % Reduction in Area: [1:-98.70%] [N/A:N/A] % Reduction in Volume: -98.60% [N/A:N/A] Classification: [1:Partial Thickness] [N/A:N/A] HBO Classification: [1:Grade 1] [N/A:N/A] Exudate Amount: [1:Large] [N/A:N/A] Exudate Type: [1:Serous] [N/A:N/A] Exudate Color: [1:amber] [N/A:N/A] Wound Margin: [1:Distinct, outline attached N/A] Granulation Amount: [1:None Present (0%)] [N/A:N/A] Necrotic Amount: [1:Large (67-100%)] [N/A:N/A] Exposed Structures: [N/A:N/A] Fascia: No Fat Layer (Subcutaneous Tissue) Exposed: No Tendon: No Muscle: No Joint: No Bone: No Limited to Skin Breakdown Epithelialization: None N/A  N/A Debridement: Debridement (47096- N/A N/A 11047) Pre-procedure 12:49 N/A N/A Verification/Time Out Taken: Pain Control: Lidocaine 4% Topical N/A N/A Solution Tissue Debrided: Fibrin/Slough, Exudates, N/A N/A Subcutaneous Level: Skin/Subcutaneous N/A N/A Tissue Debridement Area (sq 0.2 N/A N/A cm): Instrument: Blade, Curette, Forceps N/A N/A Bleeding: Moderate N/A N/A Hemostasis Achieved: Silver Nitrate N/A N/A Procedural Pain: 0 N/A N/A Post Procedural Pain: 0 N/A N/A Debridement Treatment Procedure was tolerated N/A N/A Response: well Post Debridement 0.8x0.8x0.5 N/A N/A Measurements L x W x D (cm) Post Debridement 0.251 N/A N/A Volume: (cm) Periwound Skin Texture: No Abnormalities Noted N/A N/A Periwound Skin No Abnormalities Noted N/A N/A Moisture: Periwound Skin Color: No Abnormalities Noted N/A N/A Temperature: No Abnormality N/A N/A Tenderness on Yes N/A N/A Palpation: Wound Preparation: Ulcer Cleansing: N/A N/A Rinsed/Irrigated with Saline Topical Anesthetic Applied: Other: lidocaine 4% Procedures Performed: Debridement N/A N/A Andrew Lyons, Andrew Lyons ([id number]) Treatment Notes Wound #1 (Right Amputation Site - Below Knee) 1. Cleansed with: Clean wound with Normal Saline 2. Anesthetic Topical Lidocaine 4% cream to wound bed prior to debridement 3. Peri-wound Care: Skin Prep 4. Dressing Applied: Prisma Ag 5. Secondary Dressing Applied Bordered Foam Dressing Dry Gauze Electronic Signature(s) Signed: 01/18/2017 7:39:10 AM By: Linton Ham MD Entered By: Linton Ham on 01/17/2017 13:29:10 Andrew Lyons ([id number]) -------------------------------------------------------------------------------- [place] Details Patient Name: Andrew Lyons Date of Service: 01/17/2017 12:30 PM Medical Record Number: [id number] Patient Account Number: [id number] Date of Birth/Sex: [birth year] (71 y.o. Male) Treating RN: [name] Primary Care Sparrow [place]: [name], Delfino Lovett Other Clinician: Referring Cheng Dec: [name], [name] Treating Zenobia Kuennen/Extender: [name] in Treatment: 1 Active Inactive ` Abuse / Safety / Falls / Self Care Management Nursing Diagnoses: Potential for falls Goals: Patient will remain injury free Date Initiated: 01/04/2017 Target Resolution Date: 04/01/2017 Goal Status: Active Interventions: Assess fall risk on admission and as needed Assess self care needs on admission and as needed Notes: ` Orientation to the Wound Care Program Nursing Diagnoses: Knowledge deficit related to the wound healing center program Goals: Patient/caregiver will verbalize understanding of the [place] Program Date Initiated: 01/04/2017 Target Resolution Date: 01/28/2017 Goal Status: Active Interventions: Provide education on orientation to the wound center Notes: ` Wound/Skin Impairment Nursing Diagnoses: Impaired tissue integrity Andrew Lyons, Andrew Lyons ([id number]) Knowledge deficit related to ulceration/compromised skin integrity Goals: Ulcer/skin breakdown will have a volume reduction of 80% by week 12 Date Initiated: 01/04/2017 Target Resolution Date: 04/22/2017 Goal Status: Active Interventions: Assess patient/caregiver ability to perform ulcer/skin care regimen upon admission and as needed Assess ulceration(s) every visit Notes: Electronic Signature(s) Signed: 01/17/2017 4:19:56 PM By: Alric Quan Entered By: Alric Quan on 01/17/2017 13:19:39 Andrew Lyons ([id number]) -------------------------------------------------------------------------------- Pain Assessment Details Patient Name: Andrew Lyons Date of Service: 01/17/2017 12:30 PM Medical Record Number:  [id number] Patient Account Number: [id number] Date of Birth/Sex: 07/10/1946 (71 y.o. Male) Treating RN: [name], [name] Primary Care Ceceilia Cephus: [name], Delfino Lovett Other  Clinician: Referring Lunah Losasso: [name], [name] Treating Xavyer Steenson/Extender: [name] in Treatment: 1 Active Problems Location of Pain Severity and Description of Pain Patient Has Paino No Site Locations With Dressing Change: No Pain Management and Medication Current Pain Management: Electronic Signature(s) Signed: 01/17/2017 4:19:56 PM By: Alric Quan Entered By: Alric Quan on 01/17/2017 12:40:29 Andrew Lyons ([id number]) -------------------------------------------------------------------------------- Patient/Caregiver Education Details [name] Date of Service: 01/17/2017 12:30 PM Patient Name: T. Patient Account Number: [id number] Medical Record Treating RN: [name] [id number] Number: Other Clinician: Date of Birth/Gender: 1945/11/10 (70 y.o. Male) Treating [place], [place] Primary Care Physician: [name], Delfino Lovett Physician/Extender: G Referring Physician: [name] in Treatment: 1 Education Assessment Education Provided To: Patient Education Topics Provided Wound/Skin Impairment: Handouts: Other: change dressing as ordered Electronic Signature(s) Signed: 01/17/2017 4:19:56 PM By: Alric Quan Entered By: Alric Quan on 01/17/2017 13:20:44 Andrew Lyons ([id number]) -------------------------------------------------------------------------------- Wound Assessment Details Patient Name: Andrew Lyons Date of Service: 01/17/2017 12:30 PM Medical Record Number: [id number] Patient Account Number: [id number] Date of Birth/Sex: 1946/03/01 (71 y.o. Male) Treating RN: [name] Primary Care Kavi Almquist: [name], Delfino Lovett Other Clinician: Referring Karletta Millay: [name] Treating Sumiko Ceasar/Extender: [name] in Treatment: 1 Wound Status Wound Number: 1 Primary Atypical Etiology: Wound Location: Right Amputation Site - Below Knee Wound Open Status: Wounding Event:  Gradually Appeared Comorbid Cataracts, Chronic Obstructive Date Acquired: 08/26/2016 History: Pulmonary Disease (COPD), Sleep Weeks Of Treatment: 1 Apnea, Hypotension, Myocardial Clustered Wound: No Infarction, Type II Diabetes, Osteoarthritis, Neuropathy Photos Photo Uploaded By: Alric Quan on 01/17/2017 16:15:55 Wound Measurements Length: (cm) 0.4 Width: (cm) 0.5 Depth: (cm) 0.9 Area: (cm) 0.157 Volume: (cm) 0.141 % Reduction in Area: -98.7% % Reduction in Volume: -98.6% Epithelialization: None Tunneling: No Undermining: No Wound Description Classification: Partial Thickness Foul O Diabetic Severity (Wagner): Grade 1 Slough Wound Margin: Distinct, outline attached Exudate Amount: Large Exudate Type: Serous Exudate Color: amber dor After Cleansing: No /Fibrino Yes Wound Bed Granulation Amount: None Present (0%) Exposed Structure Andrew Lyons, Andrew Lyons ([id number]) Necrotic Amount: Large (67-100%) Fascia Exposed: No Necrotic Quality: Adherent Slough Fat Layer (Subcutaneous Tissue) Exposed: No Tendon Exposed: No Muscle Exposed: No Joint Exposed: No Bone Exposed: No Limited to Skin Breakdown Periwound Skin Texture Texture Color No Abnormalities Noted: No No Abnormalities Noted: No Moisture Temperature / Pain No Abnormalities Noted: No Temperature: No Abnormality Tenderness on Palpation: Yes Wound Preparation Ulcer Cleansing: Rinsed/Irrigated with Saline Topical Anesthetic Applied: Other: lidocaine 4%, Treatment Notes Wound #1 (Right Amputation Site - Below Knee) 1. Cleansed with: Clean wound with Normal Saline 2. Anesthetic Topical Lidocaine 4% cream to wound bed prior to debridement 3. Peri-wound Care: Skin Prep 4. Dressing Applied: Prisma Ag 5. Secondary Dressing Applied Bordered Foam Dressing Dry Gauze Electronic Signature(s) Signed: 01/17/2017 4:19:56 PM By: Alric Quan Entered By: Alric Quan on 01/17/2017 12:46:20 Andrew Lyons ([id number]) -------------------------------------------------------------------------------- [place] Details Patient Name: Andrew Lyons Date of Service: 01/17/2017 12:30 PM Medical Record Number: [id number] Patient Account Number: [id number] Date of Birth/Sex: [birth year] (71 y.o. Male) Treating RN: [name] Primary Care Nazanin Kinner: [name], Delfino Lovett Other Clinician: Referring Laprecious Austill: [name], [name] Treating Roberto Romanoski/Extender: [name] in Treatment: 1 Vital Signs Time Taken: 12:40 Temperature (F): 97.6 Height (in): 70 Pulse (bpm): 59 Weight (lbs): 266.5 Respiratory Rate (breaths/min): 18 Body Mass Index (BMI): 38.2  Blood Pressure (mmHg): 133/60 Reference Range: 80 - 120 mg / dl Electronic Signature(s) Signed: 01/17/2017 4:19:56 PM By: Alric Quan Entered By: Alric Quan on 01/17/2017 12:40:46

## 2017-01-18 NOTE — Progress Notes (Signed)
ELMOND, POEHLMAN ([id number]) Visit Report for 01/17/2017 Chief Complaint Document Details Andrew Lyons Date of Service: 01/17/2017 12:30 PM Patient Name: T. Patient Account Number: [id number] Medical Record Treating RN: [name] [id number] Number: Other Clinician: Date of Birth/Sex: Jul 13, 1946 (70 y.o. 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 Electronic Signature(s) Signed: 01/18/2017 7:39:10 AM By: Linton Ham MD Entered By: Linton Ham on 01/17/2017 13:29:32 Andrew Lyons ([id number]) -------------------------------------------------------------------------------- Debridement Details Andrew Lyons Date of Service: 01/17/2017 12:30 PM Patient Name: T. Patient Account Number: [id number] Medical Record Treating RN: [name] [id number] Number: Other Clinician: Date of Birth/Sex: 09-Jul-1946 (70 y.o. Male) Treating [name], [place] Primary Care Provider: [name], Delfino Lovett Provider/Extender: G Referring Provider: [name] in Treatment: 1 Debridement Performed for Wound #1 Right Amputation Site - Below Knee Assessment: Performed By: Physician [name], MD Debridement: Debridement Pre-procedure Yes - 12:49 Verification/Time Out Taken: Start Time: 12:50 Pain Control: Lidocaine 4% Topical Solution Level: Skin/Subcutaneous Tissue Total Area Debrided (L x 0.4 (cm) x 0.5 (cm) = 0.2 (cm) W): Tissue and other Viable, Non-Viable, Exudate, Fibrin/Slough, Subcutaneous material debrided: Instrument: Blade, Curette, Forceps Bleeding: Moderate Hemostasis Achieved: Silver Nitrate End Time: 13:02 Procedural Pain: 0 Post Procedural Pain: 0 Response to Treatment:  Procedure was tolerated well Post Debridement Measurements of Total Wound Length: (cm) 0.8 Width: (cm) 0.8 Depth: (cm) 0.5 Volume: (cm) 0.251 Character of Wound/Ulcer Post Requires Further Debridement Debridement: Severity of Tissue Post Debridement: Fat layer exposed Post Procedure Diagnosis Same as Pre-procedure Electronic Signature(s) Signed: 01/17/2017 4:19:56 PM By: Alric Quan Signed: 01/18/2017 7:39:10 AM By: Linton Ham MD Andrew Lyons ([id number]) Entered By: Linton Ham on 01/17/2017 13:29:18 Andrew Lyons ([id number]) -------------------------------------------------------------------------------- HPI Details Andrew Lyons Date of Service: 01/17/2017 12:30 PM Patient Name: T. Patient Account Number: [id number] Medical Record Treating RN: [name] [id number] Number: Other Clinician: Date of Birth/Sex: 25-Jun-1946 (70 y.o. Male) Treating Linton Ham Primary Care Provider: [name] Provider/Extender: G Referring Provider: [name] in Treatment: 1 History of Present Illness HPI Description: 01/04/17; this is a 71 year old diabetic man who has a remote history of a traumatic lower extremity damage at age 71 requiring an amputation. He tells me spent 2 years walking on crutches then ultimately has been walking on prosthesis without any trouble since then. Several months ago he had a new prosthesis and developed an open area in the right stump in December. He saw podiatry Dr. Prudence Davidson who noted that this was really out of his practice jurisdiction and referred him here. He has been using mupirocin. He has continued to walk on the prosthesis. The prosthesis itself as been adjusted by the prostatitis. He does not have a known arterial issue. He tells me he probably has diabetic neuropathy. He had a wound that took a long time to heal surrounding the actual amputation itself however is not had more  recent wounds. 01/10/17 Culture from last week grew pseudomonas. change AB to cefdinir yesterday from doxy. he is not using his prosthesis which I emphasized although he is having it adjusted next week 01/17/17; he is completing his antibiotics in the next day or 2. We've been using silver alginate. His prosthesis is away being readjusted. He had an x-ray of the underlying bone here apparently at his  podiatrist's office although I'll need to have a look at that and we can't find the result then he may need this re-x-rayed. Electronic Signature(s) Signed: 01/18/2017 7:39:10 AM By: Linton Ham MD Entered By: Linton Ham on 01/17/2017 13:30:45 Andrew Lyons ([id number]) -------------------------------------------------------------------------------- Physical Exam Details Andrew Lyons Date of Service: 01/17/2017 12:30 PM Patient Name: T. Patient Account Number: [id number] Medical Record Treating RN: [name] [id number] Number: Other Clinician: Date of Birth/Sex: October 08, 1945 (71 y.o. Male) Treating Linton Ham Primary Care Provider: [name], Delfino Lovett Provider/Extender: G Referring Provider: [name], [name] Weeks in Treatment: 1 Constitutional Sitting or standing Blood Pressure is within target range for patient.. Pulse regular and within target range for patient.Marland Kitchen Respirations regular, non-labored and within target range.. Temperature is normal and within the target range for the patient.. Patient's appearance is neat and clean. Appears in no acute distress. Well nourished and well developed.. Notes Wound exam; the areas a small punched out area on the right leg stump. Using a #3 curet I cleaned off the base of the wound them with pickups and a scalpel removed thick callus and nonviable's subcutaneous tissue from the wound surface. This opened up the entire wound for proper dressing. There is no evidence of surrounding infection Electronic  Signature(s) Signed: 01/18/2017 7:39:10 AM By: Linton Ham MD Entered By: Linton Ham on 01/17/2017 13:31:40 Andrew Lyons ([id number]) -------------------------------------------------------------------------------- Physician Orders Details Andrew Lyons Date of Service: 01/17/2017 12:30 PM Patient Name: T. Patient Account Number: [id number] Medical Record Treating RN: [name] [id number] Number: Other Clinician: Date of Birth/Sex: Apr 13, 1946 (70 y.o. Male) Treating [name], [place] Primary Care Provider: [name], Delfino Lovett Provider/Extender: G Referring Provider: [name] in Treatment: 1 Verbal / Phone Orders: Yes Clinician: [name], [name] Read Back and Verified: Yes Diagnosis Coding Wound Cleansing Wound #1 Right Amputation Site - Below Knee o Clean wound with Normal Saline. o Cleanse wound with mild soap and water Anesthetic Wound #1 Right Amputation Site - Below Knee o Topical Lidocaine 4% cream applied to wound bed prior to debridement Skin Barriers/Peri-Wound Care Wound #1 Right Amputation Site - Below Knee o Skin Prep Primary Wound Dressing Wound #1 Right Amputation Site - Below Knee o Prisma Ag Secondary Dressing Wound #1 Right Amputation Site - Below Knee o Dry Gauze o Boardered Foam Dressing Dressing Change Frequency Wound #1 Right Amputation Site - Below Knee o Change dressing every day. Follow-up Appointments Wound #1 Right Amputation Site - Below Knee o Return Appointment in 1 week. Edema Control Wound #1 Right Amputation Site - Below Knee o Elevate legs to the level of the heart and pump ankles as often as possible Andrew Lyons, Andrew T. ([id number]) Off-Loading o Other: - do not wear your prosthesis Additional Orders / Instructions Wound #1 Right Amputation Site - Below Knee o Increase protein intake. Medications-please add to medication list. Wound #1 Right Amputation Site - Below  Knee o Other: - vitamin C, zinc, MVI Electronic Signature(s) Signed: 01/17/2017 4:19:56 PM By: Alric Quan Signed: 01/18/2017 7:39:10 AM By: Linton Ham MD Entered By: Alric Quan on 01/17/2017 13:09:52 Andrew Lyons, Andrew Lyons ([id number]) -------------------------------------------------------------------------------- Problem List Details Andrew Lyons Date of Service: 01/17/2017 12:30 PM Patient Name: T. Patient Account Number: [id number] Medical Record Treating RN: [name] [id number] Number: Other Clinician: Date of Birth/Sex: 11/02/1945 (70 y.o. 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 Patient Account Number: [id number] Medical Record Treating RN: [name] [id number] Number: Other Clinician: Date of Birth/Sex: 12-04-1945 (70 y.o. 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 History of Present Illness (HPI) 01/04/17; this is a 72 year old diabetic man who has a remote history of a traumatic lower extremity damage at age 23 requiring an amputation. He tells me spent 2 years walking on crutches then ultimately has been walking on prosthesis without any trouble since then. Several months ago he had a new prosthesis and developed an open area in the right stump in December. He saw podiatry Dr. Prudence Davidson who noted that this was really out of his practice jurisdiction and referred him here. He has been using mupirocin. He has continued to walk on the prosthesis. The prosthesis itself as been adjusted by the prostatitis. He does not have a known arterial issue. He tells me he probably has diabetic neuropathy. He had a wound that took a long time to heal surrounding the actual amputation itself however is not had more recent wounds. 01/10/17 Culture from last week grew pseudomonas. change AB to cefdinir yesterday from doxy. he is not using his prosthesis which I emphasized although he is having it adjusted next week 01/17/17; he is completing his antibiotics in the next day or 2. We've been using silver alginate. His prosthesis is away being readjusted. He had an x-ray of the underlying bone here apparently at his podiatrist's office although I'll need to have a look at that and we can't find the result then he may need this re-x-rayed. Objective Constitutional Sitting or standing Blood Pressure is within target range for patient.. Pulse regular and within target range for patient.Marland Kitchen Respirations regular, non-labored and within target range.. Temperature is normal and within the target range for the patient.. Patient's appearance is neat and clean. Appears in no acute distress. Well nourished and well developed.. Vitals Time Taken: 12:40 PM, Height: 70 in, Weight: 266.5 lbs, BMI: 38.2, Temperature:  97.6 F, Pulse: 59 Andrew Lyons, Andrew T. ([id number]) bpm, Respiratory Rate: 18 breaths/min, Blood Pressure: 133/60 mmHg. General Notes: Wound exam; the areas a small punched out area on the right leg stump. Using a #3 curet I cleaned off the base of the wound them with pickups and a scalpel removed thick callus and nonviable's subcutaneous tissue from the wound surface. This opened up the entire wound for proper dressing. There is no evidence of surrounding infection Integumentary (Hair, Skin) Wound #1 status is Open. Original cause of wound was Gradually Appeared. 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 The wound measures 0.4cm length x 0.5cm width x 0.9cm depth; 0.157cm^2 area and 0.141cm^3 volume. The wound is limited to skin breakdown. There is no tunneling or undermining noted. There is a  large amount of serous drainage noted. The wound margin is distinct with the outline attached to the wound base. There is no granulation within the wound bed. There is a large (67- 100%) amount of necrotic tissue within the wound bed including Adherent Slough. Periwound temperature was noted as No Abnormality. The periwound has tenderness on palpation. 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 There was a Skin/Subcutaneous Tissue Debridement ([id number]) debridement with total area of 0.2 sq cm performed by [name], MD. with the following instrument(s): Blade, Curette, and Forceps to remove Viable and Non-Viable tissue/material including Exudate, Fibrin/Slough, and Subcutaneous after  achieving pain control using Lidocaine 4% Topical Solution. A time out was conducted at 12:49, prior to the start of the procedure. A Moderate amount of bleeding was controlled with Silver Nitrate. The procedure was tolerated well with a pain level of 0 throughout and a pain level of 0 following the procedure. Post Debridement Measurements: 0.8cm length x 0.8cm width x 0.5cm depth; 0.251cm^3 volume. Character of Wound/Ulcer Post Debridement requires further debridement. Severity of Tissue Post Debridement is: Fat layer exposed. Andrew Lyons, Andrew Lyons ([id number]) Post procedure Diagnosis Wound #1: Same as Pre-Procedure Plan Wound Cleansing: Wound #1 Right Amputation Site - Below Knee: Clean wound with Normal Saline. Cleanse wound with mild soap and water Anesthetic: Wound #1 Right Amputation Site - Below Knee: Topical Lidocaine 4% cream applied to wound bed prior to debridement Skin Barriers/Peri-Wound Care: Wound #1 Right Amputation Site - Below Knee: Skin Prep Primary Wound Dressing: Wound #1 Right Amputation Site - Below Knee: Prisma Ag Secondary Dressing: Wound #1 Right Amputation Site - Below Knee: Dry Gauze Boardered Foam Dressing Dressing Change Frequency: Wound #1 Right Amputation Site - Below Knee: Change dressing every day. Follow-up Appointments: Wound #1 Right Amputation Site - Below Knee: Return Appointment in 1 week. Edema Control: Wound #1 Right Amputation Site - Below Knee: Elevate legs to the level of the heart and pump ankles as often as possible Off-Loading: Other: - do not wear your prosthesis Additional Orders / Instructions: Wound #1 Right Amputation Site - Below Knee: Increase protein intake. Medications-please add to medication list.: Wound #1 Right Amputation Site - Below Knee: Other: - vitamin C, zinc, MVI Andrew Lyons, GHAN. ([id number]) #1 I change to Silver Collegen today from silver alginate after relatively aggressive debridement. #2 he will  finish his antibiotics in 2 days I will monitor the site after that #3 if this stalls or worsens he'll need repeat x-ray of the underlying bone. #4 his daughter is present and is asking about a wound VAC and that might be an option. I also wonder about [facility] if stimulating granulated closure is an issue. #5 I removed a lot of thick surrounding callus from the circumference of this wound nonviable subcutaneous tissue and totally opened the wound. I'm hopeful that this will allow proper granulation Electronic Signature(s) Signed: 01/18/2017 7:39:10 AM By: Linton Ham MD Entered By: Linton Ham on 01/17/2017 13:33:51 Andrew Lyons, Andrew Lyons ([id number]) -------------------------------------------------------------------------------- SuperBill Details Andrew Lyons Date of Service: 01/17/2017 Patient Name: T. Patient Account Number: [id number] Medical Record Treating RN: [name] [id number] Number: Other Clinician: Date  of Birth/Sex: [birth year] (71 y.o. 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, right lower leg, S88.111D subsequent encounter L03.115 Cellulitis of right lower limb Facility Procedures CPT4 Code Description: [id number] - DEB SUBQ TISSUE 20 SQ CM/< ICD-10 Description Diagnosis E11.622 Type 2 diabetes mellitus with other skin ulcer L97.313 Non-pressure chronic ulcer of right ankle with necr Modifier: osis of musc Quantity: 1 le Physician Procedures CPT4 Code Description: [id number] - WC PHYS SUBQ TISS 20 SQ CM ICD-10 Description Diagnosis E11.622 Type 2 diabetes mellitus with other skin ulcer L97.313  Non-pressure chronic ulcer of right ankle with necr Modifier: osis of musc Quantity: 1 le Electronic Signature(s) Signed: 01/18/2017 7:39:10 AM By: Linton Ham MD Entered By: Linton Ham on 01/17/2017 13:34:17

## 2017-01-21 ENCOUNTER — Other Ambulatory Visit: Payer: Self-pay | Admitting: Family Medicine

## 2017-01-23 ENCOUNTER — Ambulatory Visit (INDEPENDENT_AMBULATORY_CARE_PROVIDER_SITE_OTHER): Payer: PPO | Admitting: Family Medicine

## 2017-01-23 VITALS — BP 132/60 | HR 58 | Temp 98.6°F | Resp 16

## 2017-01-23 DIAGNOSIS — E118 Type 2 diabetes mellitus with unspecified complications: Secondary | ICD-10-CM | POA: Diagnosis not present

## 2017-01-23 DIAGNOSIS — E7800 Pure hypercholesterolemia, unspecified: Secondary | ICD-10-CM

## 2017-01-23 DIAGNOSIS — I10 Essential (primary) hypertension: Secondary | ICD-10-CM

## 2017-01-23 LAB — POCT GLYCOSYLATED HEMOGLOBIN (HGB A1C)
Est. average glucose Bld gHb Est-mCnc: 143
Hemoglobin A1C: 6.6

## 2017-01-23 NOTE — Progress Notes (Signed)
Patient: Andrew Lyons Male    DOB: October 14, 1945   71 y.o.   MRN: [id number] Visit Date: 01/23/2017  Today's Provider: [name], MD   Chief Complaint  Patient presents with  . Diabetes  . Hypertension  . Hyperlipidemia   Subjective:    HPI  Diabetes Mellitus Type II, Follow-up:   Lab Results  Component Value Date   HGBA1C 6.6 01/23/2017   HGBA1C 7.0 09/15/2016   HGBA1C 7.3 (H) 05/17/2016    Last seen for diabetes 4 months ago.  Management since then includes discontinuing Invokana and starting Jardiance 25mg  daily. He reports good compliance with treatment. He is not having side effects.  Current symptoms include none and have been stable. Home blood sugar records: fasting range: 97  Episodes of hypoglycemia? no   Current Insulin Regimen: daily Most Recent Eye Exam: up to date Weight trend: stable Prior visit with dietician: no Current diet: well balanced Current exercise: no regular exercise.  Pertinent Labs:    Component Value Date/Time   CHOL 144 05/17/2016 0706   TRIG 237 (H) 05/17/2016 0706   HDL 34 (L) 05/17/2016 0706   LDLCALC 63 05/17/2016 0706   CREATININE 1.26 05/17/2016 0706    Wt Readings from Last 3 Encounters:  01/09/17 260 lb (117.9 kg)  09/15/16 272 lb (123.4 kg)  04/11/16 275 lb 1.6 oz (124.8 kg)     Hypertension, follow-up:  BP Readings from Last 3 Encounters:  01/23/17 132/60  01/09/17 130/68  12/29/16 134/80    He was last seen for hypertension 4 months ago.  BP at that visit was 134/80. Management since that visit includes no changes. He reports good compliance with treatment. He is not having side effects.  He is not exercising. He is adherent to low salt diet.   Outside blood pressures are checked occasionally. He is experiencing none.  Patient denies exertional chest pressure/discomfort, lower extremity edema and palpitations.   Cardiovascular risk factors include diabetes mellitus and  dyslipidemia.      Weight trend: stable Wt Readings from Last 3 Encounters:  01/09/17 260 lb (117.9 kg)  09/15/16 272 lb (123.4 kg)  04/11/16 275 lb 1.6 oz (124.8 kg)    Current diet: well balanced       Lipid/Cholesterol, Follow-up:   Last seen for this4 months ago.  Management changes since that visit include no changes. . Last Lipid Panel:    Component Value Date/Time   CHOL 144 05/17/2016 0706   TRIG 237 (H) 05/17/2016 0706   HDL 34 (L) 05/17/2016 0706   LDLCALC 63 05/17/2016 0706    Risk factors for vascular disease include diabetes mellitus and hypertension  He reports good compliance with treatment. He is not having side effects.  Current symptoms include none and have been stable. Weight trend: stable Prior visit with dietician: no Current diet: well balanced Current exercise: none  Wt Readings from Last 3 Encounters:  01/09/17 260 lb (117.9 kg)  09/15/16 272 lb (123.4 kg)  04/11/16 275 lb 1.6 oz (124.8 kg)      Allergies  Allergen Reactions  . Iodinated Diagnostic Agents Rash    had a red chest -unusre of what kind of dye it was  . Zyban [Bupropion]   . Zocor [Simvastatin]      Current Outpatient Prescriptions:  .  amLODipine-benazepril (LOTREL) 5-40 MG capsule, Take 1 capsule by mouth daily., Disp: 90 capsule, Rfl: 3 .  aspirin 325 MG EC tablet,  Take 325 mg by mouth daily., Disp: , Rfl:  .  B-D UF III MINI PEN NEEDLES 31G X 5 MM MISC, USE WITH PEN DAILY, Disp: 90 each, Rfl: 3 .  co-enzyme Q-10 30 MG capsule, Take 100 mg by mouth daily., Disp: , Rfl:  .  empagliflozin (JARDIANCE) 25 MG TABS tablet, Take 25 mg by mouth daily., Disp: 30 tablet, Rfl: 12 .  fluticasone (FLONASE) 50 MCG/ACT nasal spray, USE 2 SPRAYS IN EACH NOSTRIL EVERY DAY, Disp: 48 g, Rfl: 12 .  metoprolol succinate (TOPROL-XL) 100 MG 24 hr tablet, TAKE 1 TABLET BY MOUTH EVERY DAY, Disp: 90 tablet, Rfl: 3 .  mometasone (ELOCON) 0.1 % cream, Apply 1 application topically daily.,  Disp: 45 g, Rfl: 0 .  montelukast (SINGULAIR) 10 MG tablet, TAKE 1 TABLET BY MOUTH EVERY DAY, Disp: 90 tablet, Rfl: 3 .  omeprazole (PRILOSEC) 40 MG capsule, TAKE ONE CAPSULE BY MOUTH EVERY DAY, Disp: 90 capsule, Rfl: 3 .  ONE TOUCH ULTRA TEST test strip, USE ONE STRIP TO CHECK GLUCOSE TWICE DAILY OR AS NEEDED, Disp: 100 each, Rfl: 11 .  rosuvastatin (CRESTOR) 20 MG tablet, TAKE 1 TABLET BY MOUTH EVERY DAY, Disp: 90 tablet, Rfl: 3 .  sildenafil (REVATIO) 20 MG tablet, Take 20 mg by mouth 3 (three) times daily., Disp: , Rfl:  .  tamsulosin (FLOMAX) 0.4 MG CAPS capsule, TAKE 1 CAPSULE (0.4 MG TOTAL) BY MOUTH DAILY., Disp: 90 capsule, Rfl: 3 .  tamsulosin (FLOMAX) 0.4 MG CAPS capsule, Take 1 capsule (0.4 mg total) by mouth daily., Disp: 30 capsule, Rfl: 11 .  VICTOZA 18 MG/3ML SOPN, INJECT 0.6 SUBCUTANEOUSLY EVERY DAY, Disp: 3 pen, Rfl: 3  Review of Systems  Constitutional: Negative.   HENT: Negative.   Eyes: Negative.   Respiratory: Negative.   Cardiovascular: Negative.   Endocrine: Negative.   Musculoskeletal: Positive for gait problem. Negative for arthralgias and back pain.  Allergic/Immunologic: Negative.   Psychiatric/Behavioral: Negative.     Social History  Substance Use Topics  . Smoking status: Former Research scientist (life sciences)  . Smokeless tobacco: Never Used     Comment: Quit smoking in 2003; Started smoking at age 79, smoked about 40 years, smoked over 3 packs per day  . Alcohol use Yes     Comment: Occcasional alcohol use   Objective:   BP 132/60 (BP Location: Right Arm, Patient Position: Sitting, Cuff Size: Normal)   Pulse (!) 58   Temp 98.6 F (37 C)   Resp 16   SpO2 99%  Vitals:   01/23/17 0905  BP: 132/60  Pulse: (!) 58  Resp: 16  Temp: 98.6 F (37 C)  SpO2: 99%     Physical Exam  Constitutional: He is oriented to person, place, and time. He appears well-developed and well-nourished.  HENT:  Head: Normocephalic and atraumatic.  Right Ear: External ear normal.  Left  Ear: External ear normal.  Nose: Nose normal.  Eyes: Conjunctivae are normal. No scleral icterus.  Neck: No thyromegaly present.  Cardiovascular: Normal rate, regular rhythm and normal heart sounds.   Pulmonary/Chest: Effort normal and breath sounds normal.  Abdominal: Soft.  Neurological: He is alert and oriented to person, place, and time.  Monofilament exam normal.   Skin: Skin is warm and dry.  Psychiatric: He has a normal mood and affect. His behavior is normal. Judgment and thought content normal.        Assessment & Plan:     1. Benign essential HTN Stable. Continue  current medications.   2. Type 2 diabetes mellitus with complication, without long-term current use of insulin ([place]) Tolerating Jardiance well. HgbA1c was 6.6 today. Recheck again in 4 months.  - POCT glycosylated hemoglobin (Hb A1C)  3. Pure hypercholesterolemia Stable. Continue current medications.  4.s/p BKA      I have done the exam and reviewed the above chart and it is accurate to the best of my knowledge. Development worker, community has been used in this note in any air is in the dictation or transcription are unintentional.  [name], MD  [facility]

## 2017-01-24 ENCOUNTER — Encounter: Payer: PPO | Attending: Internal Medicine | Admitting: Internal Medicine

## 2017-01-24 ENCOUNTER — Ambulatory Visit
Admission: RE | Admit: 2017-01-24 | Discharge: 2017-01-24 | Disposition: A | Discharge status: Home / Self Care | Payer: PPO | Source: Ambulatory Visit | Attending: Internal Medicine | Admitting: Internal Medicine

## 2017-01-24 ENCOUNTER — Other Ambulatory Visit
Admission: RE | Admit: 2017-01-24 | Discharge: 2017-01-24 | Disposition: A | Discharge status: Home / Self Care | Payer: PPO | Source: Ambulatory Visit | Attending: Internal Medicine | Admitting: Internal Medicine

## 2017-01-24 ENCOUNTER — Telehealth: Payer: Self-pay

## 2017-01-24 ENCOUNTER — Other Ambulatory Visit: Payer: Self-pay | Admitting: Internal Medicine

## 2017-01-24 ENCOUNTER — Telehealth: Payer: Self-pay | Admitting: Family Medicine

## 2017-01-24 DIAGNOSIS — B999 Unspecified infectious disease: Secondary | ICD-10-CM

## 2017-01-24 DIAGNOSIS — I252 Old myocardial infarction: Secondary | ICD-10-CM | POA: Insufficient documentation

## 2017-01-24 DIAGNOSIS — S88111D Complete traumatic amputation at level between knee and ankle, right lower leg, subsequent encounter: Secondary | ICD-10-CM | POA: Insufficient documentation

## 2017-01-24 DIAGNOSIS — Z87891 Personal history of nicotine dependence: Secondary | ICD-10-CM | POA: Insufficient documentation

## 2017-01-24 DIAGNOSIS — S81801A Unspecified open wound, right lower leg, initial encounter: Secondary | ICD-10-CM | POA: Diagnosis not present

## 2017-01-24 DIAGNOSIS — I871 Compression of vein: Secondary | ICD-10-CM | POA: Insufficient documentation

## 2017-01-24 DIAGNOSIS — Z794 Long term (current) use of insulin: Secondary | ICD-10-CM | POA: Diagnosis not present

## 2017-01-24 DIAGNOSIS — J449 Chronic obstructive pulmonary disease, unspecified: Secondary | ICD-10-CM | POA: Diagnosis not present

## 2017-01-24 DIAGNOSIS — G4730 Sleep apnea, unspecified: Secondary | ICD-10-CM | POA: Diagnosis not present

## 2017-01-24 DIAGNOSIS — Z6838 Body mass index (BMI) 38.0-38.9, adult: Secondary | ICD-10-CM | POA: Diagnosis not present

## 2017-01-24 DIAGNOSIS — L03115 Cellulitis of right lower limb: Secondary | ICD-10-CM | POA: Insufficient documentation

## 2017-01-24 DIAGNOSIS — L97313 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 of muscle: Secondary | ICD-10-CM | POA: Insufficient documentation

## 2017-01-24 DIAGNOSIS — M7989 Other specified soft tissue disorders: Secondary | ICD-10-CM | POA: Diagnosis not present

## 2017-01-24 DIAGNOSIS — Z89441 Acquired absence of right ankle: Secondary | ICD-10-CM | POA: Insufficient documentation

## 2017-01-24 DIAGNOSIS — M1990 Unspecified osteoarthritis, unspecified site: Secondary | ICD-10-CM | POA: Diagnosis not present

## 2017-01-24 DIAGNOSIS — E11622 Type 2 diabetes mellitus with other skin ulcer: Secondary | ICD-10-CM | POA: Insufficient documentation

## 2017-01-24 DIAGNOSIS — X58XXXD Exposure to other specified factors, subsequent encounter: Secondary | ICD-10-CM | POA: Insufficient documentation

## 2017-01-24 DIAGNOSIS — I959 Hypotension, unspecified: Secondary | ICD-10-CM | POA: Insufficient documentation

## 2017-01-24 DIAGNOSIS — S81001A Unspecified open wound, right knee, initial encounter: Secondary | ICD-10-CM | POA: Diagnosis not present

## 2017-01-24 MED ORDER — GLUCOSE BLOOD VI STRP: ORAL_STRIP | 11 refills | Status: DC

## 2017-01-24 MED ORDER — ONETOUCH ULTRASOFT LANCETS MISC: 12 refills | Status: DC

## 2017-01-24 MED ORDER — ONETOUCH ULTRA SYSTEM W/DEVICE KIT: PACK | 0 refills | Status: DC

## 2017-01-24 NOTE — Telephone Encounter (Signed)
This has been taking care off-aa

## 2017-01-24 NOTE — Telephone Encounter (Signed)
rx for meter, strips and lancets sent in

## 2017-01-24 NOTE — Telephone Encounter (Signed)
[name] patient's wife calling regarding the glucose monitor at home not working and wants to know if they can be given one from the office.  CB # [phone]  Thanks,  -Jaryiah Mehlman

## 2017-01-24 NOTE — Telephone Encounter (Signed)
Pt wife states they did come by and pick up a glucose meter but it is not working.  She is requesting a Rx for a One Touch Ultra glucose meter, test stripes and lancets.  [place]  CB#[phone]/MW

## 2017-01-25 ENCOUNTER — Telehealth: Payer: Self-pay | Admitting: Family Medicine

## 2017-01-25 ENCOUNTER — Other Ambulatory Visit: Payer: Self-pay | Admitting: Emergency Medicine

## 2017-01-25 DIAGNOSIS — E118 Type 2 diabetes mellitus with unspecified complications: Secondary | ICD-10-CM

## 2017-01-25 MED ORDER — ONETOUCH DELICA LANCETS FINE MISC: 1.0000 | Freq: Three times a day (TID) | 11 refills | Status: DC

## 2017-01-25 MED ORDER — GLUCOSE BLOOD VI STRP: ORAL_STRIP | 12 refills | Status: DC

## 2017-01-25 NOTE — Telephone Encounter (Signed)
Pt called saying that the glucose one touch meter that was given in the office yesterday is working.  They called saying they did not think it was but they figured it out today so they would like some test strips to go with that meter.    [place]  [facility]

## 2017-01-25 NOTE — Telephone Encounter (Signed)
Done-aa 

## 2017-01-26 NOTE — Progress Notes (Addendum)
CORNEL, WERBER ([id number]) Visit Report for 01/24/2017 Arrival Information Details Patient Name: Andrew Lyons, Andrew Lyons Date of Service: 01/24/2017 11:00 AM Medical Record Number: [id number] Patient Account Number: [id number] Date of Birth/Sex: 21-Nov-1945 (71 y.o. Male) Treating RN: [name] Primary Care Hall Birchard: [name], Delfino Lovett Other Clinician: Referring Luceil Herrin: [name] Treating Liandra Mendia/Extender: [name] in Treatment: 2 Visit Information History Since Last Visit Added or deleted any medications: No Patient Arrived: Other Any new allergies or adverse reactions: No Arrival Time: 11:12 Had a fall or experienced change in No Accompanied By: spouse activities of daily living that may affect Transfer Assistance: None risk of falls: Patient Identification Verified: Yes Signs or symptoms of abuse/neglect since last No Secondary Verification Process Completed: Yes visito Patient Requires Transmission-Based No Hospitalized since last visit: No Precautions: Has Dressing in Place as Prescribed: Yes Patient Has Alerts: Yes Pain Present Now: No Patient Alerts: DM II Electronic Signature(s) Signed: 01/24/2017 4:44:24 PM By: [name] Entered By: [name] on 01/24/2017 11:13:23 Andrew Lyons ([id number]) -------------------------------------------------------------------------------- Encounter Discharge Information Details Patient Name: Andrew Lyons Date of Service: 01/24/2017 11:00 AM Medical Record Number: [id number] Patient Account Number: [id number] Date of Birth/Sex: 04-28-1946 (71 y.o. Male) Treating RN: [name] Primary Care Teiana Hajduk: [name], Delfino Lovett Other Clinician: Referring Nikolaj Geraghty: [name] Treating Ohm Dentler/Extender: [name] in Treatment: 2 Encounter Discharge Information Items Discharge Pain Level: 0 Discharge Condition: Stable Ambulatory Status: Walker Discharge  Destination: Home Transportation: Private Auto Accompanied By: spouse Schedule Follow-up Appointment: Yes Medication Reconciliation completed No and provided to Patient/Care Desiree Daise: Provided on Clinical Summary of Care: 01/24/2017 Form Type Recipient Paper Patient KA Electronic Signature(s) Signed: 01/24/2017 12:00:12 PM By: Ruthine Dose Entered By: Ruthine Dose on 01/24/2017 12:00:11 Andrew Lyons ([id number]) -------------------------------------------------------------------------------- Lower Extremity Assessment Details Patient Name: Andrew Lyons Date of Service: 01/24/2017 11:00 AM Medical Record Number: [id number] Patient Account Number: [id number] Date of Birth/Sex: 08-27-1946 (71 y.o. Male) Treating RN: [name] Primary Care Kainoa Swoboda: [name], Delfino Lovett Other Clinician: Referring Natally Ribera: [name], [name] Treating Atira Borello/Extender: [name] in Treatment: 2 Vascular Assessment Pulses: Dorsalis Pedis Palpable: [Right:Yes] Posterior Tibial Extremity colors, hair growth, and conditions: Extremity Color: [Right:Normal] Hair Growth on Extremity: [Right:No] Temperature of Extremity: [Right:Warm] Capillary Refill: [Right:< 3 seconds] Electronic Signature(s) Signed: 01/24/2017 4:44:24 PM By: [name] Entered By: [name] on 01/24/2017 11:22:55 Andrew Lyons ([id number]) -------------------------------------------------------------------------------- Multi Wound Chart Details Patient Name: Andrew Lyons Date of Service: 01/24/2017 11:00 AM Medical Record Number: [id number] Patient Account Number: [id number] Date of Birth/Sex: 03-27-1946 (71 y.o. Male) Treating RN: [name] Primary Care Tredarius Cobern: [name], Delfino Lovett Other Clinician: Referring Armondo Cech: [name], [name] Treating Megha Agnes/Extender: [name] in Treatment: 2 Vital Signs Height(in): 70 Pulse(bpm): 55 Weight(lbs): 266.5  Blood Pressure 138/59 (mmHg): Body Mass Index(BMI): 38 Temperature(F): 97.9 Respiratory Rate 18 (breaths/min): Photos: [N/A:N/A] Wound Location: Right Amputation Site - N/A N/A Below Knee Wounding Event: Gradually Appeared N/A N/A Primary Etiology: Atypical N/A N/A Comorbid History: Cataracts, Chronic N/A N/A Obstructive Pulmonary Disease (COPD), Sleep Apnea, Hypotension, Myocardial Infarction, Type II Diabetes, Osteoarthritis, Neuropathy Date Acquired: 08/26/2016 N/A N/A Weeks of Treatment: 2 N/A N/A Wound Status: Open N/A N/A Measurements L x W x D 0.6x0.7x0.9 N/A N/A (cm) Area (cm) : 0.33 N/A N/A Volume (cm) : 0.297 N/A N/A % Reduction in Area: -317.70% N/A N/A % Reduction in Volume: -318.30% N/A N/A Classification: Partial Thickness N/A N/A HBO Classification: Grade 1 N/A  N/A Exudate Amount: Large N/A N/A Andrew Lyons, Andrew Lyons ([id number]) Exudate Type: Serous N/A N/A Exudate Color: amber N/A N/A Wound Margin: Distinct, outline attached N/A N/A Granulation Amount: None Present (0%) N/A N/A Necrotic Amount: Large (67-100%) N/A N/A Exposed Structures: Fascia: No N/A N/A Fat Layer (Subcutaneous Tissue) Exposed: No Tendon: No Muscle: No Joint: No Bone: No Limited to Skin Breakdown Epithelialization: None N/A N/A Periwound Skin Texture: Callus: Yes N/A N/A Excoriation: No Induration: No Crepitus: No Rash: No Scarring: No Periwound Skin Maceration: No N/A N/A Moisture: Dry/Scaly: No Periwound Skin Color: Atrophie Blanche: No N/A N/A Cyanosis: No Ecchymosis: No Erythema: No Hemosiderin Staining: No Mottled: No Pallor: No Rubor: No Temperature: No Abnormality N/A N/A Tenderness on Yes N/A N/A Palpation: Wound Preparation: Ulcer Cleansing: N/A N/A Rinsed/Irrigated with Saline Topical Anesthetic Applied: Other: lidocaine 4% Treatment Notes Electronic Signature(s) Signed: 01/25/2017 7:53:49 AM By: Linton Ham MD Entered By: Linton Ham  on 01/24/2017 12:06:29 Andrew Lyons ([id number]) -------------------------------------------------------------------------------- [place] Details Patient Name: Andrew Lyons Date of Service: 01/24/2017 11:00 AM Medical Record Number: [id number] Patient Account Number: [id number] Date of Birth/Sex: [birth year] (71 y.o. Male) Treating RN: [name] Primary Care Earnest Mcgillis: [name], Delfino Lovett Other Clinician: Referring Jesicca Dipierro: [name] Treating Bijal Siglin/Extender: [name] in Treatment: 2 Active Inactive ` Abuse / Safety / Falls / Self Care Management Nursing Diagnoses: Potential for falls Goals: Patient will remain injury free Date Initiated: 01/04/2017 Target Resolution Date: 04/01/2017 Goal Status: Active Interventions: Assess fall risk on admission and as needed Assess self care needs on admission and as needed Notes: ` Orientation to the Wound Care Program Nursing Diagnoses: Knowledge deficit related to the wound healing center program Goals: Patient/caregiver will verbalize understanding of the [facility] Program Date Initiated: 01/04/2017 Target Resolution Date: 01/28/2017 Goal Status: Active Interventions: Provide education on orientation to the wound center Notes: ` Wound/Skin Impairment Nursing Diagnoses: Impaired tissue integrity Andrew Lyons, Andrew Lyons ([id number]) Knowledge deficit related to ulceration/compromised skin integrity Goals: Ulcer/skin breakdown will have a volume reduction of 80% by week 12 Date Initiated: 01/04/2017 Target Resolution Date: 04/22/2017 Goal Status: Active Interventions: Assess patient/caregiver ability to perform ulcer/skin care regimen upon admission and as needed Assess ulceration(s) every visit Notes: Electronic Signature(s) Signed: 01/24/2017 4:44:24 PM By: [name] Entered By: [name] on 01/24/2017 11:23:04 Andrew Lyons  ([id number]) -------------------------------------------------------------------------------- Pain Assessment Details Patient Name: Andrew Lyons Date of Service: 01/24/2017 11:00 AM Medical Record Number: [id number] Patient Account Number: [id number] Date of Birth/Sex: 1946-08-03 (71 y.o. Male) Treating RN: [name] Primary Care Ahmir Bracken: [name] Other Clinician: Referring Kannon Baum: [name] Treating Jadarious Dobbins/Extender: [name] in Treatment: 2 Active Problems Location of Pain Severity and Description of Pain Patient Has Paino No Site Locations Pain Management and Medication Current Pain Management: Notes Topical or injectable lidocaine is offered to patient for acute pain when surgical debridement is performed. If needed, Patient is instructed to use over the counter pain medication for the following 24-48 hours after debridement. Wound care MDs do not prescribed pain medications. Patient has chronic pain or uncontrolled pain. Patient has been instructed to make an appointment with their Primary Care Physician for pain management. Electronic Signature(s) Signed: 01/24/2017 4:44:24 PM By: [name] Entered By: [name] on 01/24/2017 11:13:32 Andrew Lyons ([id number]) -------------------------------------------------------------------------------- Patient/Caregiver Education Details [name] Date of Service: 01/24/2017 11:00 AM Patient Name: T. Patient Account Number: [id number] Medical Record Treating RN: [name] [id number] Number:  Other Clinician: Date of Birth/Gender: 10-21-1945 (71 y.o. Male) Treating [place], [place] Primary Care Physician: [name], Delfino Lovett Physician/Extender: G Referring Physician: [name] in Treatment: 2 Education Assessment Education Provided To: Patient and Caregiver Education Topics Provided Wound/Skin Impairment: Handouts: Other: wound care as  ordered Methods: Demonstration, Explain/Verbal Responses: State content correctly Electronic Signature(s) Signed: 01/24/2017 4:44:24 PM By: [name] Entered By: [name] on 01/24/2017 11:24:50 Andrew Lyons ([id number]) -------------------------------------------------------------------------------- Wound Assessment Details Patient Name: Andrew Lyons Date of Service: 01/24/2017 11:00 AM Medical Record Number: [id number] Patient Account Number: [id number] Date of Birth/Sex: December 10, 1945 (71 y.o. Male) Treating RN: [name] Primary Care Kathryn Cosby: [name], Delfino Lovett Other Clinician: Referring Jaydee Ingman: [name] Treating Jerri Glauser/Extender: [name] in Treatment: 2 Wound Status Wound Number: 1 Primary Atypical Etiology: Wound Location: Right Amputation Site - Below Knee Wound Open Status: Wounding Event: Gradually Appeared Comorbid Cataracts, Chronic Obstructive Date Acquired: 08/26/2016 History: Pulmonary Disease (COPD), Sleep Weeks Of Treatment: 2 Apnea, Hypotension, Myocardial Clustered Wound: No Infarction, Type II Diabetes, Osteoarthritis, Neuropathy Photos Wound Measurements Length: (cm) 0.6 Width: (cm) 0.7 Depth: (cm) 0.9 Area: (cm) 0.33 Volume: (cm) 0.297 % Reduction in Area: -317.7% % Reduction in Volume: -318.3% Epithelialization: None Tunneling: No Undermining: No Wound Description Full Thickness With Exposed Classification: Support Structures Diabetic Severity Grade 1 ([place]): Wound Margin: Distinct, outline attached Exudate Amount: Large Exudate Type: Serous Exudate Color: amber Foul Odor After Cleansing: No Slough/Fibrino Yes Wound Bed Andrew Lyons, Andrew Lyons ([id number]) Granulation Amount: None Present (0%) Exposed Structure Necrotic Amount: Large (67-100%) Fascia Exposed: No Necrotic Quality: Adherent Slough Fat Layer (Subcutaneous Tissue) Exposed: Yes Tendon Exposed: No Muscle Exposed:  Yes Necrosis of Muscle: Yes Joint Exposed: No Bone Exposed: No Periwound Skin Texture Texture Color No Abnormalities Noted: No No Abnormalities Noted: No Callus: Yes Atrophie Blanche: No Crepitus: No Cyanosis: No Excoriation: No Ecchymosis: No Induration: No Erythema: No Rash: No Hemosiderin Staining: No Scarring: No Mottled: No Pallor: No Moisture Rubor: No No Abnormalities Noted: No Dry / Scaly: No Temperature / Pain Maceration: No Temperature: No Abnormality Tenderness on Palpation: Yes Wound Preparation Ulcer Cleansing: Rinsed/Irrigated with Saline Topical Anesthetic Applied: Other: lidocaine 4%, Treatment Notes Wound #1 (Right Amputation Site - Below Knee) 1. Cleansed with: Clean wound with Normal Saline 2. Anesthetic Topical Lidocaine 4% cream to wound bed prior to debridement 4. Dressing Applied: Prisma Ag 5. Secondary Dressing Applied Bordered Foam Dressing Dry Gauze Electronic Signature(s) Signed: 01/27/2017 9:18:23 AM By: [name] Previous Signature: 01/24/2017 4:44:24 PM Version By: [name] Entered By: [name] on 01/27/2017 09:18:22 Andrew Lyons ([id number]) -------------------------------------------------------------------------------- [place] Details Patient Name: Andrew Lyons Date of Service: 01/24/2017 11:00 AM Medical Record Number: [id number] Patient Account Number: [id number] Date of Birth/Sex: 20-Sep-1946 (71 y.o. Male) Treating RN: [name] Primary Care Mazi Schuff: [name], Delfino Lovett Other Clinician: Referring Yumiko Alkins: [name] Treating Amaal Dimartino/Extender: [name] in Treatment: 2 Vital Signs Time Taken: 11:13 Temperature (F): 97.9 Height (in): 70 Pulse (bpm): 55 Weight (lbs): 266.5 Respiratory Rate (breaths/min): 18 Body Mass Index (BMI): 38.2 Blood Pressure (mmHg): 138/59 Reference Range: 80 - 120 mg / dl Electronic Signature(s) Signed: 01/24/2017 4:44:24 PM By: [name] Entered By: [name] on 01/24/2017 11:14:41

## 2017-01-27 LAB — AEROBIC CULTURE W GRAM STAIN (SUPERFICIAL SPECIMEN)

## 2017-01-27 LAB — AEROBIC CULTURE  (SUPERFICIAL SPECIMEN)

## 2017-01-27 NOTE — Progress Notes (Addendum)
Andrew Lyons, Andrew Lyons ([id number]) Visit Report for 01/24/2017 Chief Complaint Document Details Andrew Lyons Date of Service: 01/24/2017 11:00 AM Patient Name: T. Patient Account Number: [id number] Medical Record Treating RN: [name] [id number] Number: Other Clinician: Date of Birth/Sex: 11-Nov-1945 (71 y.o. 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 Electronic Signature(s) Signed: 01/25/2017 7:53:49 AM By: Linton Ham MD Entered By: Linton Ham on 01/24/2017 12:06:39 Andrew Lyons ([id number]) -------------------------------------------------------------------------------- Debridement Details Andrew Lyons Date of Service: 01/24/2017 11:00 AM Patient Name: T. Patient Account Number: [id number] Medical Record Treating RN: [name] [id number] Number: Other Clinician: Date of Birth/Sex: [birth year] (71 y.o. Male) Treating Shronda Boeh, [place] Primary Care Provider: [name], Delfino Lovett Provider/Extender: G Referring Provider: [name] in Treatment: 2 Debridement Performed for Wound #1 Right Amputation Site - Below Knee Assessment: Performed By: Physician [name], MD Debridement: Debridement Pre-procedure Yes - 11:40 Verification/Time Out Taken: Start Time: 11:41 Pain Control: Lidocaine 2% Topical Gel Level: Skin/Subcutaneous Tissue/Muscle Total Area Debrided (L x 0.6 (cm) x 0.7 (cm) = 0.42 (cm) W): Tissue and other Viable, Non-Viable, Eschar, Fibrin/Slough, Muscle, Subcutaneous material debrided: Instrument: Curette Bleeding: Minimum Hemostasis Achieved: Pressure End Time: 11:43 Procedural Pain: 0 Post Procedural Pain: 0 Response to Treatment: Procedure was  tolerated well Post Debridement Measurements of Total Wound Length: (cm) 0.6 Width: (cm) 0.7 Depth: (cm) 0.9 Volume: (cm) 0.297 Character of Wound/Ulcer Post Stable Debridement: Severity of Tissue Post Debridement: Necrosis of muscle Post Procedure Diagnosis Same as Pre-procedure Electronic Signature(s) Signed: 01/27/2017 9:19:04 AM By: [name] Signed: 01/27/2017 3:01:20 PM By: Linton Ham MD Andrew Lyons ([id number]) Previous Signature: 01/27/2017 9:16:57 AM Version By: [name] Previous Signature: 01/24/2017 4:44:24 PM Version By: [name] Previous Signature: 01/25/2017 7:53:49 AM Version By: Linton Ham MD Entered By: [name] on 01/27/2017 09:19:03 Andrew Lyons ([id number]) -------------------------------------------------------------------------------- HPI Details Andrew Lyons Date of Service: 01/24/2017 11:00 AM Patient Name: T. Patient Account Number: [id number] Medical Record Treating RN: [name] [id number] Number: Other Clinician: Date of Birth/Sex: Jan 13, 1946 (71 y.o. 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 He tells me spent 2 years walking on crutches then ultimately has been walking on prosthesis without any trouble since then. Several months ago he had a new prosthesis and developed an open area in the right stump in December. He saw podiatry Dr. Prudence Davidson who noted that this was really out of his practice jurisdiction and referred him here. He has been using mupirocin. He has continued to walk on the prosthesis. The prosthesis itself as been adjusted by the prostatitis. He does not have a known arterial issue. He tells me  he probably has diabetic neuropathy. He had a wound that took a long time to heal surrounding the actual amputation itself however is not had more recent wounds. 01/10/17 Culture from last week grew pseudomonas. change AB to cefdinir yesterday from doxy. he is not using his prosthesis which I emphasized although he is having it adjusted next week 01/17/17; he is completing his antibiotics in the next day or  2. We've been using silver alginate. His prosthesis is away being readjusted. He had an x-ray of the underlying bone here apparently at his podiatrist's office although I'll need to have a look at that and we can't find the result then he may need this re-x-rayed. 01/24/17; he has completed his antibiotics. Change to Silver collagen last week. No real change in the wound this week. Electronic Signature(s) Signed: 01/25/2017 7:53:49 AM By: Linton Ham MD Entered By: Linton Ham on 01/24/2017 12:07:26 Andrew Lyons ([id number]) -------------------------------------------------------------------------------- Physical Exam Details Andrew Lyons Date of Service: 01/24/2017 11:00 AM Patient Name: T. Patient Account Number: [id number] Medical Record Treating RN: [name] [id number] Number: Other Clinician: Date of Birth/Sex: 03/30/1946 (71 y.o. Male) Treating Linton Ham Primary Care Provider: [name], Delfino Lovett Provider/Extender: G Referring Provider: [name], [name] Weeks in Treatment: 2 Constitutional Sitting or standing Blood Pressure is within target range for patient.. Pulse regular and within target range for patient.Marland Kitchen Respirations regular, non-labored and within target range.. Temperature is normal and within the target range for the patient.. Patient's appearance is neat and clean. Appears in no acute distress. Well nourished and well developed.. Cardiovascular I don't feel his femoral pulse although a popliteal pulses palpable. Notes Wound exam; area is  a small punched out area on the right lateral stump once again using a #3 curet I cleaned off the base of the wound circumferential eschar and necrotic tissue over the wound surface. Post debridement this all was looks fairly healthy. There is no overt infection but being we cultured Pseudomonas out of this when he first came in here with purulent drainage I've recultured this. There is no surrounding soft tissue infection. The stump is cool Electronic Signature(s) Signed: 01/25/2017 7:53:49 AM By: Linton Ham MD Entered By: Linton Ham on 01/24/2017 12:08:43 Andrew Lyons ([id number]) -------------------------------------------------------------------------------- Physician Orders Details Andrew Lyons Date of Service: 01/24/2017 11:00 AM Patient Name: T. Patient Account Number: [id number] Medical Record Treating RN: [name] [id number] Number: Other Clinician: Date of Birth/Sex: 13-Oct-1945 (71 y.o. Male) Treating Evrett Hakim, [place] Primary Care Provider: [name], Delfino Lovett Provider/Extender: G Referring Provider: [name] in Treatment: 2 Verbal / Phone Orders: No Diagnosis Coding Wound Cleansing Wound #1 Right Amputation Site - Below Knee o Clean wound with Normal Saline. o Cleanse wound with mild soap and water Anesthetic Wound #1 Right Amputation Site - Below Knee o Topical Lidocaine 4% cream applied to wound bed prior to debridement Skin Barriers/Peri-Wound Care Wound #1 Right Amputation Site - Below Knee o Skin Prep Primary Wound Dressing Wound #1 Right Amputation Site - Below Knee o Prisma Ag Secondary Dressing Wound #1 Right Amputation Site - Below Knee o Dry Gauze o Boardered Foam Dressing Dressing Change Frequency Wound #1 Right Amputation Site - Below Knee o Change dressing every day. Follow-up Appointments Wound #1 Right Amputation Site - Below Knee o Return Appointment in 1 week. Edema Control Wound #1 Right  Amputation Site - Below Knee o Elevate legs to the level of the heart and pump ankles as often as possible Andrew Lyons, Andrew T. ([id number]) Off-Loading o Other: - do not wear your prosthesis Additional Orders / Instructions Wound #1 Right Amputation Site - Below Knee o Increase protein intake. Medications-please add to medication list. Wound #1 Right Amputation Site - Below Knee o Other: - vitamin C, zinc, MVI Laboratory o Bacteria identified in Wound by Culture (MICRO) - R BKA site oooo LOINC Code: 3614-4 oooo Convenience Name: Wound culture routine Radiology o  X-ray, lower leg - Right Services and Therapies o Arterial Studies- Unilateral - Right Electronic Signature(s) Signed: 01/24/2017 4:44:24 PM By: [name] Signed: 01/25/2017 7:53:49 AM By: Linton Ham MD Entered By: [name] on 01/24/2017 11:48:25 Andrew Lyons, Andrew Lyons ([id number]) -------------------------------------------------------------------------------- Problem List Details Andrew Lyons Date of Service: 01/24/2017 11:00 AM Patient Name: T. Patient Account Number: [id number] Medical Record Treating RN: [name] [id number] Number: Other Clinician: Date of Birth/Sex: January 22, 1946 (71 y.o. 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 Patient Account Number: [id number] Medical Record Treating RN: [name] [id number] Number: Other Clinician: Date of Birth/Sex: 05-Aug-1946 (71 y.o. 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 History of Present Illness (HPI) 01/04/17; this is a 71 year old diabetic man who has a remote history of a traumatic lower extremity damage at age 66 requiring an amputation. He tells me spent 2 years walking on crutches then ultimately has been walking on prosthesis without any trouble since then. Several months ago he had a new prosthesis and developed an open area in the right stump in December. He saw podiatry Dr. Prudence Davidson who noted that this was really out of his practice jurisdiction and referred him here. He has been using mupirocin. He has continued to walk on the prosthesis. The prosthesis itself as been adjusted by the prostatitis. He does not have a known arterial issue. He tells me he probably has diabetic neuropathy. He had a wound that took a long time to heal surrounding the actual amputation itself however is not had more recent wounds. 01/10/17 Culture from last week grew pseudomonas. change AB to cefdinir yesterday from doxy. he is not using his prosthesis which I emphasized although he is having it adjusted next week 01/17/17; he is completing his antibiotics in the next day or 2. We've been using silver alginate. His prosthesis is away being readjusted. He had an x-ray of the  underlying bone here apparently at his podiatrist's office although I'll need to have a look at that and we can't find the result then he may need this re-x-rayed. 01/24/17; he has completed his antibiotics. Change to Silver collagen last week. No real change in the wound this week. Objective Constitutional Sitting or standing Blood Pressure is within target range for patient.. Pulse regular and within target range for patient.Marland Kitchen Respirations regular, non-labored and within target range.. Temperature is normal and within the target range for the patient.. Patient's appearance is neat and clean. Appears in no acute distress. Well nourished and well developed.Andrew Lyons ([id number]) Vitals Time Taken: 11:13 AM, Height: 70 in, Weight: 266.5 lbs, BMI: 38.2, Temperature: 97.9 F, Pulse: 55 bpm,  Respiratory Rate: 18 breaths/min, Blood Pressure: 138/59 mmHg. Cardiovascular I don't feel his femoral pulse although a popliteal pulses palpable. General Notes: Wound exam; area is a small punched out area on the right lateral stump once again using a #3 curet I cleaned off the base of the wound circumferential eschar and necrotic tissue over the wound surface. Post debridement this all was looks fairly healthy. There is no overt infection but being we cultured Pseudomonas out of this when he first came in here with purulent drainage I've recultured this. There is no surrounding soft tissue infection. The stump is cool Integumentary (Hair, Skin) Wound #1 status is Open. Original cause of wound was Gradually Appeared. 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 The wound measures 0.6cm length x 0.7cm width x 0.9cm depth; 0.33cm^2 area and 0.297cm^3 volume. There is muscle and Fat Layer (Subcutaneous Tissue) Exposed exposed. There is no tunneling or undermining noted. There is a large amount of serous drainage noted. The wound margin is distinct with the outline attached to the  wound base. There is no granulation within the wound bed. There is a large (67-100%) amount of necrotic tissue within the wound bed including Adherent Slough and Necrosis of Muscle. The periwound skin appearance exhibited: Callus. The periwound skin appearance did not exhibit: Crepitus, Excoriation, Induration, Rash, Scarring, Dry/Scaly, Maceration, Atrophie Blanche, Cyanosis, Ecchymosis, Hemosiderin Staining, Mottled, Pallor, Rubor, Erythema. Periwound temperature was noted as No Abnormality. The periwound has tenderness on palpation. 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 There was a Andrew Lyons, Andrew Lyons ([id number]) Skin/Subcutaneous Tissue/Muscle Debridement [phone]) debridement with total area of 0.42 sq cm performed by [name], MD. with the following instrument(s): Curette to remove Viable and Non-Viable tissue/material including Fibrin/Slough, Muscle, Eschar, and Subcutaneous after achieving pain control using Lidocaine 2% Topical Gel. A time out was conducted at 11:40, prior to the start of the procedure. A Minimum amount of bleeding was controlled with Pressure. The procedure was tolerated well with a pain level of 0 throughout and a pain level of 0 following the procedure. Post Debridement Measurements: 0.6cm length x 0.7cm width x 0.9cm depth; 0.297cm^3 volume. Character of Wound/Ulcer Post Debridement is stable. Severity of Tissue Post Debridement is: Necrosis of muscle. Post procedure Diagnosis Wound #1: Same as Pre-Procedure Plan Wound Cleansing: Wound #1 Right Amputation Site - Below  Knee: Clean wound with Normal Saline. Cleanse wound with mild soap and water Anesthetic: Wound #1 Right Amputation Site - Below Knee: Topical Lidocaine 4% cream applied to wound bed prior to debridement Skin Barriers/Peri-Wound Care: Wound #1 Right Amputation Site - Below Knee: Skin Prep Primary Wound Dressing: Wound #1 Right Amputation Site - Below Knee: Prisma Ag Secondary Dressing: Wound #1 Right Amputation Site - Below Knee: Dry Gauze Boardered Foam Dressing Dressing Change Frequency: Wound #1 Right Amputation Site - Below Knee: Change dressing every day. Follow-up Appointments: Wound #1 Right Amputation Site - Below Knee: Return Appointment in 1 week. Edema Control: Wound #1 Right Amputation Site - Below Knee: Elevate legs to the level of the heart and pump ankles as often as possible Off-Loading: Other: - do  not wear your prosthesis Additional Orders / Instructions: Wound #1 Right Amputation Site - Below Knee: Increase protein intake. Medications-please add to medication list.: Andrew Lyons, Andrew Lyons ([id number]) Wound #1 Right Amputation Site - Below Knee: Other: - vitamin C, zinc, MVI Laboratory ordered were: Wound culture routine - R BKA site Services and Therapies ordered were: Arterial Studies- Unilateral - Right Radiology ordered were: X-ray, lower leg - Right #1 nonhealing wound on the right stump. #2 I'm going to go ahead and x-ray the stump, vascular studies including segmental ABIs and arterial Dopplers. I've also recultured this. He has a diabetic #3 for now I am not altering the primary dressing which is collagen Electronic Signature(s) Signed: 01/27/2017 10:42:48 AM By: Gretta Cool RN, BSN, Kim RN, BSN Signed: 01/27/2017 3:01:20 PM By: Linton Ham MD Previous Signature: 01/25/2017 8:45:26 AM Version By: Gretta Cool RN, BSN, Kim RN, BSN Previous Signature: 01/25/2017 5:52:53 PM Version By: Linton Ham MD Previous Signature: 01/25/2017 7:53:49 AM Version By: Linton Ham MD Entered By: Gretta Cool RN, BSN, Kim on 01/27/2017 10:42:48 Andrew Lyons, Andrew Lyons ([id number]) -------------------------------------------------------------------------------- SuperBill Details Andrew Lyons Date of Service: 01/24/2017 Patient Name: T. Patient Account Number: [id number] Medical Record Treating RN: [name] [id number] Number: Other Clinician: Date of Birth/Sex: 1945-10-17 (71 y.o. 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, right lower leg, S88.111D subsequent encounter L03.115 Cellulitis of right lower limb Facility Procedures CPT4 Code Description: [id number] - DEB MUSC/FASCIA 20 SQ CM/< ICD-10 Description Diagnosis E11.622 Type 2 diabetes mellitus with other skin ulcer L97.313 Non-pressure chronic ulcer of right ankle with necr Modifier: osis of musc Quantity: 1 le Physician Procedures CPT4 Code Description: [id number] - WC PHYS DEBR MUSCLE/FASCIA 20 SQ CM ICD-10 Description Diagnosis E11.622 Type 2 diabetes mellitus with other skin ulcer L97.313 Non-pressure chronic ulcer of right ankle with necro Modifier: sis of musc Quantity: 1 le Electronic Signature(s) Signed: 01/27/2017 9:21:31 AM By: Gretta Cool, RN, BSN, Kim RN, BSN Signed: 01/27/2017 3:01:20 PM By: Linton Ham MD Previous Signature: 01/25/2017 7:53:49 AM Version By: Linton Ham MD Entered By: Gretta Cool, RN, BSN, Kim on 01/27/2017 09:21:31

## 2017-01-31 ENCOUNTER — Encounter: Payer: PPO | Admitting: Internal Medicine

## 2017-01-31 DIAGNOSIS — S81001A Unspecified open wound, right knee, initial encounter: Secondary | ICD-10-CM | POA: Diagnosis not present

## 2017-01-31 DIAGNOSIS — E11622 Type 2 diabetes mellitus with other skin ulcer: Secondary | ICD-10-CM | POA: Diagnosis not present

## 2017-02-02 DIAGNOSIS — T8189XA Other complications of procedures, not elsewhere classified, initial encounter: Secondary | ICD-10-CM | POA: Diagnosis not present

## 2017-02-02 NOTE — Progress Notes (Signed)
MAXON, KRESSE ([id number]) Visit Report for 01/31/2017 Chief Complaint Document Details [name] Date of Service: 01/31/2017 11:00 AM Patient Name: T. Patient Account Number: [id number] Medical Record Treating RN: [name] [id number] Number: Other Clinician: Date of Birth/Sex: [birth year] (70 y.o. 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 Electronic Signature(s) Signed: 02/01/2017 12:30:45 PM By: Linton Ham MD Entered By: Linton Ham on 01/31/2017 16:40:40 [name] ([id number]) -------------------------------------------------------------------------------- Debridement Details [name] Date of Service: 01/31/2017 11:00 AM Patient Name: T. Patient Account Number: [id number] Medical Record Treating RN: [name] [id number] Number: Other Clinician: Date of Birth/Sex: January 01, 1946 (70 y.o. Male) Treating ROBSON, [place] Primary Care Provider: [name], Delfino Lovett Provider/Extender: G Referring Provider: [name] in Treatment: 3 Debridement Performed for Wound #1 Right Amputation Site - Below Knee Assessment: Performed By: Physician [name], MD Debridement: Debridement Pre-procedure Yes - 11:28 Verification/Time Out Taken: Start Time: 11:28 Pain Control: Lidocaine 4% Topical Solution Level: Skin/Subcutaneous Tissue Total Area Debrided (L x 0.6 (cm) x 0.6 (cm) = 0.36 (cm) W): Tissue and other Viable, Non-Viable, Callus, Eschar, Fibrin/Slough, Subcutaneous material debrided: Instrument: Curette Bleeding: Minimum Hemostasis Achieved: Pressure End Time: 11:30 Procedural Pain: 0 Post Procedural Pain: 0 Response to Treatment: Procedure was  tolerated well Post Debridement Measurements of Total Wound Length: (cm) 0.6 Width: (cm) 0.6 Depth: (cm) 0.6 Volume: (cm) 0.17 Character of Wound/Ulcer Post Improved Debridement: Severity of Tissue Post Debridement: Fat layer exposed Post Procedure Diagnosis Same as Pre-procedure Electronic Signature(s) Signed: 01/31/2017 4:56:51 PM By: [name] Signed: 02/01/2017 12:30:45 PM By: Linton Ham MD [name] ([id number]) Entered By: Linton Ham on 01/31/2017 16:40:09 [name] ([id number]) -------------------------------------------------------------------------------- HPI Details [name] Date of Service: 01/31/2017 11:00 AM Patient Name: T. Patient Account Number: [id number] Medical Record Treating RN: [name] [id number] Number: Other Clinician: Date of Birth/Sex: [birth year] (70 y.o. Male) Treating [name], [place] Primary Care Provider: [name] Provider/Extender: G Referring Provider: [name] in Treatment: 3 History of Present Illness HPI Description: 01/04/17; this is a 71 year old diabetic man who has a remote history of a traumatic lower extremity damage at age 89 requiring an amputation. He tells me spent 2 years walking on crutches then ultimately has been walking on prosthesis without any trouble since then. Several months ago he had a new prosthesis and developed an open area in the right stump in December. He saw podiatry Dr. Prudence Davidson who noted that this was really out of his practice jurisdiction and referred him here. He has been using mupirocin. He has continued to walk on the prosthesis. The prosthesis itself as been adjusted by the prostatitis. He does not have a known arterial issue. He tells me he probably has diabetic neuropathy. He had a wound that took a long time to heal surrounding the actual amputation itself however is not had more recent wounds. 01/10/17 Culture from last week grew  pseudomonas. change AB to cefdinir yesterday from doxy. he is not using his prosthesis which I emphasized although he is having it adjusted next week 01/17/17; he is completing his antibiotics in the next day or 2. We've been using silver alginate. His prosthesis is away being readjusted. He had an x-ray of the underlying bone here apparently at his podiatrist's office although I'll  need to have a look at that and we can't find the result then he may need this re-x-rayed. 01/24/17; he has completed his antibiotics. Change to Silver collagen last week. No real change in the wound this week. 01/31/17 repeat culture I did last week showed a few Pseudomonas. This only has intermediate sensitivity to quinolones I therefore put him back on a week of Cefdinir. X-ray of the stump did not show any bone destruction. We are attempting to get vascular studies done through Dr.Arida's office on May 23. Electronic Signature(s) Signed: 02/01/2017 12:30:45 PM By: Linton Ham MD Entered By: Linton Ham on 01/31/2017 16:42:13 [name] ([id number]) -------------------------------------------------------------------------------- Physical Exam Details [name] of Service: 01/31/2017 11:00 AM Patient Name: T. Patient Account Number: [id number] Medical Record Treating RN: [name] [id number] Number: Other Clinician: Date of Birth/Sex: 16-Jul-1946 (70 y.o. Male) Treating Linton Ham Primary Care Provider: [name], Delfino Lovett Provider/Extender: G Referring Provider: [name], [name] Weeks in Treatment: 3 Constitutional Sitting or standing Blood Pressure is within target range for patient.. Pulse regular and within target range for patient.Marland Kitchen Respirations regular, non-labored and within target range.. Temperature is normal and within the target range for the patient.. Patient's appearance is neat and clean. Appears in no acute distress. Well nourished and well  developed.. Cardiovascular Popliteal pulses difficult to feel. Notes Wound exam; small punched out area on the right lateral stump. Once again using a #3 curet I cleaned out the base of this. This still has some depth however the tissue looks stable. No overt surrounding infection. No surrounding erythema and no tenderness Electronic Signature(s) Signed: 02/01/2017 12:30:45 PM By: Linton Ham MD Entered By: Linton Ham on 01/31/2017 16:44:00 [name] ([id number]) -------------------------------------------------------------------------------- Physician Orders Details [name] Date of Service: 01/31/2017 11:00 AM Patient Name: T. Patient Account Number: [id number] Medical Record Treating RN: [name] [id number] Number: Other Clinician: Date of Birth/Sex: 1945/10/08 (70 y.o. Male) Treating ROBSON, [place] Primary Care Provider: [name], Delfino Lovett Provider/Extender: G Referring Provider: [name] in Treatment: 3 Verbal / Phone Orders: No Diagnosis Coding Wound Cleansing Wound #1 Right Amputation Site - Below Knee o Clean wound with Normal Saline. o Cleanse wound with mild soap and water Anesthetic Wound #1 Right Amputation Site - Below Knee o Topical Lidocaine 4% cream applied to wound bed prior to debridement Skin Barriers/Peri-Wound Care Wound #1 Right Amputation Site - Below Knee o Skin Prep Primary Wound Dressing Wound #1 Right Amputation Site - Below Knee o Prisma Ag Secondary Dressing Wound #1 Right Amputation Site - Below Knee o Dry Gauze o Boardered Foam Dressing Dressing Change Frequency Wound #1 Right Amputation Site - Below Knee o Change dressing every day. Follow-up Appointments Wound #1 Right Amputation Site - Below Knee o Return Appointment in 1 week. Edema Control Wound #1 Right Amputation Site - Below Knee o Elevate legs to the level of the heart and pump ankles as often as  possible Andrew Lyons, Andrew T. ([id number]) Off-Loading o Other: - do not wear your prosthesis Additional Orders / Instructions Wound #1 Right Amputation Site - Below Knee o Increase protein intake. Medications-please add to medication list. Wound #1 Right Amputation Site - Below Knee o P.O. Antibiotics - cefdinir - continue for 1 week o Other: - vitamin C, zinc, MVI Electronic Signature(s) Signed: 01/31/2017 4:56:51 PM By: [name] Signed: 02/01/2017 12:30:45 PM By: Linton Ham MD Entered By: [name] on 01/31/2017 11:31:28 [name] ([id number]) -------------------------------------------------------------------------------- Problem List Details [name] Date  of Service: 01/31/2017 11:00 AM Patient Name: T. Patient Account Number: [id number] Medical Record Treating RN: [name] [id number] Number: Other Clinician: Date of Birth/Sex: [birth year] (70 y.o. 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 Patient Account Number: [id number] Medical Record Treating RN:  [name] [id number] Number: Other Clinician: Date of Birth/Sex: July 30, 1946 (70 y.o. 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 History of Present Illness (HPI) 01/04/17; this is a 71 year old diabetic man who has a remote history of a traumatic lower extremity damage at age 42 requiring an amputation. He tells me spent 2 years walking on crutches then ultimately has been walking on prosthesis without any trouble since then. Several months ago he had a new prosthesis and developed an open area in the right stump in December. He saw podiatry Dr. Prudence Davidson who noted that this was really out of his practice jurisdiction and referred him here. He has been using mupirocin. He has continued to walk on the prosthesis. The prosthesis itself as been adjusted by the prostatitis. He does not have a known arterial issue. He tells me he probably has diabetic neuropathy. He had a wound that took a long time to heal surrounding the actual amputation itself however is not had more recent wounds. 01/10/17 Culture from last week grew pseudomonas. change AB to cefdinir yesterday from doxy. he is not using his prosthesis which I emphasized although he is having it adjusted next week 01/17/17; he is completing his antibiotics in the next day or 2. We've been using silver alginate. His prosthesis is away being readjusted. He had an x-ray of the underlying bone here apparently at his podiatrist's office although I'll need to have a look at that and we can't find the result then he may need this re-x-rayed. 01/24/17; he has completed his antibiotics. Change to Silver collagen last week. No real change in the wound this week. 01/31/17 repeat  culture I did last week showed a few Pseudomonas. This only has intermediate sensitivity to quinolones I therefore put him back on a week of Cefdinir. X-ray of the stump did not show any bone destruction. We are attempting to get vascular studies done through Dr.Arida's office on May 23. Objective Constitutional Sitting or standing Blood Pressure is within target range for patient.. Pulse regular and within target range Andrew Lyons, Andrew T. ([id number]) for patient.Marland Kitchen Respirations regular, non-labored and within target range.. Temperature is normal and within the target range for the patient.. Patient's appearance is neat and clean. Appears in no acute distress. Well nourished and well developed.. Vitals Time Taken: 10:58 AM, Height: 70 in, Weight: 266.5 lbs, BMI: 38.2, Pulse: 58 bpm,  Respiratory Rate: 18 breaths/min, Blood Pressure: 138/56 mmHg. Cardiovascular Popliteal pulses difficult to feel. General Notes: Wound exam; small punched out area on the right lateral stump. Once again using a #3 curet I cleaned out the base of this. This still has some depth however the tissue looks stable. No overt surrounding infection. No surrounding erythema and no tenderness Integumentary (Hair, Skin) Wound #1 status is Open. Original cause of wound was Gradually Appeared. 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 The wound measures 0.6cm length x 0.6cm width x 0.6cm depth; 0.283cm^2 area and 0.17cm^3 volume. There is muscle and Fat Layer (Subcutaneous Tissue) Exposed exposed. There is no tunneling or undermining noted. There is a large amount of serous drainage noted. The wound margin is distinct with the outline attached to the wound base. There is no granulation within the wound bed. There is a large (67-100%) amount of necrotic tissue within the wound bed including Adherent Slough and Necrosis of Muscle. The periwound skin appearance exhibited: Callus. The periwound  skin appearance did not exhibit: Crepitus, Excoriation, Induration, Rash, Scarring, Dry/Scaly, Maceration, Atrophie Blanche, Cyanosis, Ecchymosis, Hemosiderin Staining, Mottled, Pallor, Rubor, Erythema. Periwound temperature was noted as No Abnormality. The periwound has tenderness on palpation. 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 There was a Skin/Subcutaneous Tissue Debridement ([id number]) debridement with total area of 0.36 sq cm performed by [name], MD. with the following instrument(s): Curette to remove Viable and Non-Viable tissue/material including Fibrin/Slough, Eschar, Callus, and Subcutaneous after achieving pain control using Lidocaine 4% Topical Solution. A time out was conducted at 11:28, prior to the start of the procedure. A Minimum amount of bleeding was controlled with Pressure. The procedure was tolerated well with a pain level of 0 throughout and a pain level of 0 following the procedure. Post Debridement Measurements: 0.6cm length x 0.6cm width x 0.6cm depth; 0.17cm^3 volume. Character of Wound/Ulcer Post Debridement is improved. Severity of Tissue Post Debridement is: Fat layer exposed. Post procedure Diagnosis Wound #1: Same as Pre-Procedure Plan Wound Cleansing: Wound #1 Right Amputation Site - Below Knee: Clean wound with Normal Saline. Cleanse wound with mild soap and water Anesthetic: Wound #1 Right Amputation Site - Below Knee: Topical Lidocaine 4% cream applied to wound bed prior to debridement Skin Barriers/Peri-Wound Care: Wound #1  Right Amputation Site - Below Knee: Skin Prep Primary Wound Dressing: Wound #1 Right Amputation Site - Below Knee: Prisma Ag Secondary Dressing: Wound #1 Right Amputation Site - Below Knee: Dry Gauze Boardered Foam Dressing Dressing Change Frequency: Wound #1 Right Amputation Site - Below Knee: Change dressing every day. Follow-up Appointments: Wound #1 Right Amputation Site - Below Knee: Return Appointment in 1 week. Edema Control: Wound #1 Right Amputation Site - Below Knee: Elevate legs to the level of the heart and pump ankles as often as possible Off-Loading: Other: - do not wear your prosthesis Additional Orders / Instructions: Wound #1 Right Amputation Site - Below Knee: Increase protein intake. Andrew Lyons, Andrew Lyons ([id number]) Medications-please add to medication list.: Wound #1 Right Amputation Site - Below Knee: P.O. Antibiotics - cefdinir - continue for  1 week Other: - vitamin C, zinc, MVI #1 I continued with religious offloading of this #2. The patient seems to be compliant #3 continue silver collagen-based dressings #4 await arterial studies. #5 in sitter [facility] if this does not fill in. Electronic Signature(s) Signed: 02/01/2017 12:30:45 PM By: Linton Ham MD Entered By: Linton Ham on 01/31/2017 16:45:18 [name] ([id number]) -------------------------------------------------------------------------------- SuperBill Details [name] Date of Service: 01/31/2017 Patient Name: T. Patient Account Number: [id number] Medical Record Treating RN: [name] [id number] Number: Other Clinician: Date of Birth/Sex: [birth year] (70 y.o. 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, right lower leg, S88.111D subsequent encounter L03.115 Cellulitis of right lower limb Facility Procedures CPT4 Code Description: [id number] - DEB SUBQ TISSUE 20 SQ CM/< ICD-10 Description Diagnosis E11.622 Type 2 diabetes mellitus with other skin ulcer L97.313 Non-pressure chronic ulcer of right ankle with necr Modifier: osis of musc Quantity: 1 le Physician Procedures CPT4 Code Description: [id number] - WC PHYS SUBQ TISS 20 SQ CM ICD-10 Description Diagnosis E11.622 Type 2 diabetes mellitus with other skin ulcer L97.313 Non-pressure chronic ulcer of right ankle with necr Modifier: osis of musc Quantity: 1 le Electronic Signature(s) Signed: 02/01/2017 12:30:45 PM By: Linton Ham MD Entered By: Linton Ham on 01/31/2017 16:46:00

## 2017-02-02 NOTE — Progress Notes (Signed)
Andrew Lyons, KLUS ([id number]) Visit Report for 01/31/2017 Arrival Information Details Patient Name: Andrew Lyons, Andrew Lyons Date of Service: 01/31/2017 11:00 AM Medical Record Number: [id number] Patient Account Number: [id number] Date of Birth/Sex: 1945-11-24 (70 y.o. Male) Treating RN: [name] Primary Care Gerik Coberly: [name], Delfino Lovett Other Clinician: Referring Charita Lindenberger: [name], [name] Treating Kaisley Stiverson/Extender: [name] in Treatment: 3 Visit Information History Since Last Visit Added or deleted any medications: No Patient Arrived: Walker Any new allergies or adverse reactions: No Arrival Time: 10:58 Had a fall or experienced change in No Accompanied By: spouse activities of daily living that may affect Transfer Assistance: None risk of falls: Patient Identification Verified: Yes Signs or symptoms of abuse/neglect since last No Secondary Verification Process Completed: Yes visito Patient Requires Transmission-Based No Hospitalized since last visit: No Precautions: Has Dressing in Place as Prescribed: Yes Patient Has Alerts: Yes Pain Present Now: No Patient Alerts: DM II Electronic Signature(s) Signed: 01/31/2017 4:56:51 PM By: [name] Entered By: [name] on 01/31/2017 10:58:31 Andrew Lyons ([id number]) -------------------------------------------------------------------------------- Encounter Discharge Information Details Patient Name: Andrew Lyons Date of Service: 01/31/2017 11:00 AM Medical Record Number: [id number] Patient Account Number: [id number] Date of Birth/Sex: Jan 29, 1946 (71 y.o. Male) Treating RN: [name] Primary Care Yosselyn Tax: [name], Delfino Lovett Other Clinician: Referring Mikael Debell: [name] Treating Cletis Clack/Extender: [name] in Treatment: 3 Encounter Discharge Information Items Discharge Pain Level: 0 Discharge Condition: Stable Ambulatory Status: Walker Discharge  Destination: Home Transportation: Private Auto Accompanied By: spouse Schedule Follow-up Appointment: Yes Medication Reconciliation completed No and provided to Patient/Care Taijuan Serviss: Provided on Clinical Summary of Care: 01/31/2017 Form Type Recipient Paper Patient KA Electronic Signature(s) Signed: 01/31/2017 11:38:26 AM By: Ruthine Dose Entered By: Ruthine Dose on 01/31/2017 11:38:25 Andrew Lyons ([id number]) -------------------------------------------------------------------------------- Lower Extremity Assessment Details Patient Name: Andrew Lyons Date of Service: 01/31/2017 11:00 AM Medical Record Number: [id number] Patient Account Number: [id number] Date of Birth/Sex: 1946/07/12 (71 y.o. Male) Treating RN: [name] Primary Care Niva Murren: [name], Delfino Lovett Other Clinician: Referring Braxdon Gappa: [name], [name] Treating Ekam Besson/Extender: [name] in Treatment: 3 Vascular Assessment Pulses: Posterior Tibial Extremity colors, hair growth, and conditions: Extremity Color: [Right:Normal] Hair Growth on Extremity: [Right:No] Temperature of Extremity: [Right:Warm] Capillary Refill: [Right:< 3 seconds] Electronic Signature(s) Signed: 01/31/2017 4:56:51 PM By: [name] Entered By: [name] on 01/31/2017 11:29:24 Andrew Lyons ([id number]) -------------------------------------------------------------------------------- Multi Wound Chart Details Patient Name: Andrew Lyons Date of Service: 01/31/2017 11:00 AM Medical Record Number: [id number] Patient Account Number: [id number] Date of Birth/Sex: 1946-02-20 (71 y.o. Male) Treating RN: [name] Primary Care Tierre Gerard: [name], Delfino Lovett Other Clinician: Referring Iban Utz: [name], [name] Treating Rylei Masella/Extender: [name] in Treatment: 3 Vital Signs Height(in): 70 Pulse(bpm): 58 Weight(lbs): 266.5 Blood Pressure 138/56 (mmHg): Body Mass  Index(BMI): 38 Temperature(F): Respiratory Rate 18 (breaths/min): Photos: [1:No Photos] [N/A:N/A] Wound Location: [1:Right Amputation Site - Below Knee] [N/A:N/A] Wounding Event: [1:Gradually Appeared] [N/A:N/A] Primary Etiology: [1:Atypical] [N/A:N/A] Comorbid History: [1:Cataracts, Chronic Obstructive Pulmonary Disease (COPD), Sleep Apnea, Hypotension, Myocardial Infarction, Type II Diabetes, Osteoarthritis, Neuropathy] [N/A:N/A] Date Acquired: [1:08/26/2016] [N/A:N/A] Weeks of Treatment: [1:3] [N/A:N/A] Wound Status: [1:Open] [N/A:N/A] Measurements L x W x D 0.6x0.6x0.6 [N/A:N/A] (cm) Area (cm) : [1:0.283] [N/A:N/A] Volume (cm) : [1:0.17] [N/A:N/A] % Reduction in Area: [1:-258.20%] [N/A:N/A] % Reduction in Volume: -139.40% [N/A:N/A] Classification: [1:Full Thickness With Exposed Support Structures] [N/A:N/A] HBO Classification: [1:Grade 1] [N/A:N/A] Exudate Amount: [1:Large] [N/A:N/A] Exudate Type: [1:Serous] [N/A:N/A] Exudate Color: [1:amber] [N/A:N/A]  Wound Margin: [1:Distinct, outline attached N/A] Granulation Amount: [1:None Present (0%)] [N/A:N/A] Necrotic Amount: Large (67-100%) N/A N/A Exposed Structures: Fat Layer (Subcutaneous N/A N/A Tissue) Exposed: Yes Muscle: Yes Fascia: No Tendon: No Joint: No Bone: No Epithelialization: None N/A N/A Debridement: Debridement (10932- N/A N/A 11047) Pre-procedure 11:28 N/A N/A Verification/Time Out Taken: Pain Control: Lidocaine 4% Topical N/A N/A Solution Tissue Debrided: Necrotic/Eschar, N/A N/A Fibrin/Slough, Callus, Subcutaneous Level: Skin/Subcutaneous N/A N/A Tissue Debridement Area (sq 0.36 N/A N/A cm): Instrument: Curette N/A N/A Bleeding: Minimum N/A N/A Hemostasis Achieved: Pressure N/A N/A Procedural Pain: 0 N/A N/A Post Procedural Pain: 0 N/A N/A Debridement Treatment Procedure was tolerated N/A N/A Response: well Post Debridement 0.6x0.6x0.6 N/A N/A Measurements L x W x D (cm) Post  Debridement 0.17 N/A N/A Volume: (cm) Periwound Skin Texture: Callus: Yes N/A N/A Excoriation: No Induration: No Crepitus: No Rash: No Scarring: No Periwound Skin Maceration: No N/A N/A Moisture: Dry/Scaly: No Periwound Skin Color: Atrophie Blanche: No N/A N/A Cyanosis: No Ecchymosis: No Erythema: No Hemosiderin Staining: No Mottled: No ALPHEUS, STIFF. ([id number]) Pallor: No Rubor: No Temperature: No Abnormality N/A N/A Tenderness on Yes N/A N/A Palpation: Wound Preparation: Ulcer Cleansing: N/A N/A Rinsed/Irrigated with Saline Topical Anesthetic Applied: Other: lidocaine 4% Procedures Performed: Debridement N/A N/A Treatment Notes Wound #1 (Right Amputation Site - Below Knee) 1. Cleansed with: Clean wound with Normal Saline 2. Anesthetic Topical Lidocaine 4% cream to wound bed prior to debridement 4. Dressing Applied: Prisma Ag 5. Secondary Dressing Applied Bordered Foam Dressing Dry Gauze Electronic Signature(s) Signed: 02/01/2017 12:30:45 PM By: Linton Ham MD Entered By: Linton Ham on 01/31/2017 16:39:57 Andrew Lyons ([id number]) -------------------------------------------------------------------------------- [place] Details Patient Name: Andrew Lyons Date of Service: 01/31/2017 11:00 AM Medical Record Number: [id number] Patient Account Number: [id number] Date of Birth/Sex: 1946-01-21 (71 y.o. Male) Treating RN: [name] Primary Care Sabine Tenenbaum: [name], Delfino Lovett Other Clinician: Referring Taisa Deloria: [name] Treating Elder Davidian/Extender: [name] in Treatment: 3 Active Inactive ` Abuse / Safety / Falls / Self Care Management Nursing Diagnoses: Potential for falls Goals: Patient will remain injury free Date Initiated: 01/04/2017 Target Resolution Date: 04/01/2017 Goal Status: Active Interventions: Assess fall risk on admission and as needed Assess self care needs on  admission and as needed Notes: ` Orientation to the Wound Care Program Nursing Diagnoses: Knowledge deficit related to the wound healing center program Goals: Patient/caregiver will verbalize understanding of the [facility] Date Initiated: 01/04/2017 Target Resolution Date: 01/28/2017 Goal Status: Active Interventions: Provide education on orientation to the wound center Notes: ` Wound/Skin Impairment Nursing Diagnoses: Impaired tissue integrity JARI, DIPASQUALE ([id number]) Knowledge deficit related to ulceration/compromised skin integrity Goals: Ulcer/skin breakdown will have a volume reduction of 80% by week 12 Date Initiated: 01/04/2017 Target Resolution Date: 04/22/2017 Goal Status: Active Interventions: Assess patient/caregiver ability to perform ulcer/skin care regimen upon admission and as needed Assess ulceration(s) every visit Notes: Electronic Signature(s) Signed: 01/31/2017 4:56:51 PM By: [name] Entered By: [name] on 01/31/2017 11:29:30 Andrew Lyons ([id number]) -------------------------------------------------------------------------------- Pain Assessment Details Patient Name: Andrew Lyons Date of Service: 01/31/2017 11:00 AM Medical Record Number: [id number] Patient Account Number: [id number] Date of Birth/Sex: October 25, 1945 (71 y.o. Male) Treating RN: [name] Primary Care Marque Rademaker: [name], Delfino Lovett Other Clinician: Referring Rayhan Groleau: [name] Treating Aniken Monestime/Extender: [name] in Treatment: 3 Active Problems Location of Pain Severity and Description of Pain Patient Has Paino No Site Locations Pain Management and  Medication Current Pain Management: Notes Topical or injectable lidocaine is offered to patient for acute pain when surgical debridement is performed. If needed, Patient is instructed to use over the counter pain medication for the following 24-48 hours  after debridement. Wound care MDs do not prescribed pain medications. Patient has chronic pain or uncontrolled pain. Patient has been instructed to make an appointment with their Primary Care Physician for pain management. Electronic Signature(s) Signed: 01/31/2017 4:56:51 PM By: [name] Entered By: [name] on 01/31/2017 10:58:39 Andrew Lyons ([id number]) -------------------------------------------------------------------------------- Patient/Caregiver Education Details [name] Date of Service: 01/31/2017 11:00 AM Patient Name: T. Patient Account Number: [id number] Medical Record Treating RN: [name] [id number] Number: Other Clinician: Date of Birth/Gender: 1945/12/24 (70 y.o. Male) Treating Linton Ham Primary Care Physician: [name], Delfino Lovett Physician/Extender: G Referring Physician: [name] in Treatment: 3 Education Assessment Education Provided To: Patient and Caregiver Education Topics Provided Wound/Skin Impairment: Handouts: Other: wound care as ordered Methods: Explain/Verbal Responses: State content correctly Electronic Signature(s) Signed: 01/31/2017 4:56:51 PM By: [name] Entered By: [name] on 01/31/2017 11:32:41 Andrew Lyons ([id number]) -------------------------------------------------------------------------------- Wound Assessment Details Patient Name: Andrew Lyons Date of Service: 01/31/2017 11:00 AM Medical Record Number: [id number] Patient Account Number: [id number] Date of Birth/Sex: 1945/09/29 (71 y.o. Male) Treating RN: [name] Primary Care Jiraiya Mcewan: [name], Delfino Lovett Other Clinician: Referring Harlem Bula: [name] Treating Theophil Thivierge/Extender: [name] in Treatment: 3 Wound Status Wound Number: 1 Primary Atypical Etiology: Wound Location: Right Amputation Site - Below Knee Wound Open Status: Wounding Event: Gradually Appeared Comorbid  Cataracts, Chronic Obstructive Date Acquired: 08/26/2016 History: Pulmonary Disease (COPD), Sleep Weeks Of Treatment: 3 Apnea, Hypotension, Myocardial Clustered Wound: No Infarction, Type II Diabetes, Osteoarthritis, Neuropathy Photos Photo Uploaded By: [name] on 01/31/2017 16:51:14 Wound Measurements Length: (cm) 0.6 Width: (cm) 0.6 Depth: (cm) 0.6 Area: (cm) 0.283 Volume: (cm) 0.17 % Reduction in Area: -258.2% % Reduction in Volume: -139.4% Epithelialization: None Tunneling: No Undermining: No Wound Description Full Thickness With Exposed Classification: Support Structures Diabetic Severity Grade 1 (Wagner): Wound Margin: Distinct, outline attached Exudate Amount: Large Exudate Type: Serous Exudate Color: amber KAMONI, GENTLES ([id number]) Foul Odor After Cleansing: No Slough/Fibrino Yes Wound Bed Granulation Amount: None Present (0%) Exposed Structure Necrotic Amount: Large (67-100%) Fascia Exposed: No Necrotic Quality: Adherent Slough Fat Layer (Subcutaneous Tissue) Exposed: Yes Tendon Exposed: No Muscle Exposed: Yes Necrosis of Muscle: Yes Joint Exposed: No Bone Exposed: No Periwound Skin Texture Texture Color No Abnormalities Noted: No No Abnormalities Noted: No Callus: Yes Atrophie Blanche: No Crepitus: No Cyanosis: No Excoriation: No Ecchymosis: No Induration: No Erythema: No Rash: No Hemosiderin Staining: No Scarring: No Mottled: No Pallor: No Moisture Rubor: No No Abnormalities Noted: No Dry / Scaly: No Temperature / Pain Maceration: No Temperature: No Abnormality Tenderness on Palpation: Yes Wound Preparation Ulcer Cleansing: Rinsed/Irrigated with Saline Topical Anesthetic Applied: Other: lidocaine 4%, Treatment Notes Wound #1 (Right Amputation Site - Below Knee) 1. Cleansed with: Clean wound with Normal Saline 2. Anesthetic Topical Lidocaine 4% cream to wound bed prior to debridement 4. Dressing  Applied: Prisma Ag 5. Secondary Dressing Applied Bordered Foam Dressing Dry Gauze Electronic Signature(s) Signed: 01/31/2017 4:56:51 PM By: [name] Entered By: [name] on 01/31/2017 11:29:08 Andrew Lyons ([id number]) -------------------------------------------------------------------------------- [place] Details Patient Name: Andrew Lyons Date of Service: 01/31/2017 11:00 AM Medical Record Number: [id number] Patient Account Number: [id number] Date of Birth/Sex: 04/20/1946 (71 y.o. Male) Treating RN: [name] Primary  Care Faith Branan: [name], Delfino Lovett Other Clinician: Referring Ralphie Lovelady: [name], [name] Treating Jemel Ono/Extender: [name] in Treatment: 3 Vital Signs Time Taken: 10:58 Pulse (bpm): 58 Height (in): 70 Respiratory Rate (breaths/min): 18 Weight (lbs): 266.5 Blood Pressure (mmHg): 138/56 Body Mass Index (BMI): 38.2 Reference Range: 80 - 120 mg / dl Electronic Signature(s) Signed: 01/31/2017 4:56:51 PM By: [name] Entered By: [name] on 01/31/2017 11:00:41

## 2017-02-03 DIAGNOSIS — T8189XA Other complications of procedures, not elsewhere classified, initial encounter: Secondary | ICD-10-CM | POA: Diagnosis not present

## 2017-02-07 ENCOUNTER — Encounter: Payer: PPO | Admitting: Internal Medicine

## 2017-02-07 DIAGNOSIS — E11622 Type 2 diabetes mellitus with other skin ulcer: Secondary | ICD-10-CM | POA: Diagnosis not present

## 2017-02-07 DIAGNOSIS — S81801A Unspecified open wound, right lower leg, initial encounter: Secondary | ICD-10-CM | POA: Diagnosis not present

## 2017-02-08 ENCOUNTER — Other Ambulatory Visit: Payer: Self-pay | Admitting: Internal Medicine

## 2017-02-08 DIAGNOSIS — I739 Peripheral vascular disease, unspecified: Secondary | ICD-10-CM

## 2017-02-08 NOTE — Progress Notes (Signed)
DARRY, KELNHOFER ([id number]) Visit Report for 02/07/2017 Arrival Information Details Patient Name: Andrew Lyons, Andrew Lyons Date of Service: 02/07/2017 11:00 AM Medical Record Number: [id number] Patient Account Number: [id number] Date of Birth/Sex: 11-Sep-1946 (71 y.o. Male) Treating RN: [name] Primary Care Encarnacion Scioneaux: [name], Delfino Lovett Other Clinician: Referring Koleton Duchemin: [name] Treating Latrese Carolan/Extender: [name] in Treatment: 4 Visit Information History Since Last Visit Added or deleted any medications: No Patient Arrived: Other Any new allergies or adverse reactions: No Arrival Time: 11:03 Had a fall or experienced change in No Accompanied By: dtr activities of daily living that may affect Transfer Assistance: None risk of falls: Patient Identification Verified: Yes Signs or symptoms of abuse/neglect since last No Secondary Verification Process Completed: Yes visito Patient Requires Transmission-Based No Hospitalized since last visit: No Precautions: Has Dressing in Place as Prescribed: Yes Patient Has Alerts: Yes Pain Present Now: No Patient Alerts: DM II Electronic Signature(s) Signed: 02/07/2017 4:34:27 PM By: [name] Entered By: [name] on 02/07/2017 11:04:36 Andrew Lyons ([id number]) -------------------------------------------------------------------------------- Clinic Level of Care Assessment Details Patient Name: Andrew Lyons Date of Service: 02/07/2017 11:00 AM Medical Record Number: [id number] Patient Account Number: [id number] Date of Birth/Sex: Jul 12, 1946 (71 y.o. Male) Treating RN: [name] Primary Care Gracelynne Benedict: [name], Delfino Lovett Other Clinician: Referring Hjalmar Ballengee: [name], [name] Treating Arihaan Bellucci/Extender: [name] in Treatment: 4 Clinic Level of Care Assessment Items TOOL 4 Quantity Score []  - Use when only an EandM is performed on FOLLOW-UP visit 0 ASSESSMENTS  - Nursing Assessment / Reassessment X - Reassessment of Co-morbidities (includes updates in patient status) 1 10 X - Reassessment of Adherence to Treatment Plan 1 5 ASSESSMENTS - Wound and Skin Assessment / Reassessment X - Simple Wound Assessment / Reassessment - one wound 1 5 []  - Complex Wound Assessment / Reassessment - multiple wounds 0 []  - Dermatologic / Skin Assessment (not related to wound area) 0 ASSESSMENTS - Focused Assessment []  - Circumferential Edema Measurements - multi extremities 0 []  - Nutritional Assessment / Counseling / Intervention 0 X - Lower Extremity Assessment (monofilament, tuning fork, pulses) 1 5 []  - Peripheral Arterial Disease Assessment (using hand held doppler) 0 ASSESSMENTS - Ostomy and/or Continence Assessment and Care []  - Incontinence Assessment and Management 0 []  - Ostomy Care Assessment and Management (repouching, etc.) 0 PROCESS - Coordination of Care X - Simple Patient / Family Education for ongoing care 1 15 []  - Complex (extensive) Patient / Family Education for ongoing care 0 []  - Staff obtains Programmer, systems, Records, Test Results / Process Orders 0 []  - Staff telephones HHA, Nursing Homes / Clarify orders / etc 0 []  - Routine Transfer to another Facility (non-emergent condition) 0 DAMIANO, STAMPER ([id number]) []  - Routine Hospital Admission (non-emergent condition) 0 []  - New Admissions / Biomedical engineer / Ordering NPWT, Apligraf, etc. 0 []  - Emergency Hospital Admission (emergent condition) 0 X - Simple Discharge Coordination 1 10 []  - Complex (extensive) Discharge Coordination 0 PROCESS - Special Needs []  - Pediatric / Minor Patient Management 0 []  - Isolation Patient Management 0 []  - Hearing / Language / Visual special needs 0 []  - Assessment of Community assistance (transportation, D/C planning, etc.) 0 []  - Additional assistance / Altered mentation 0 []  - Support Surface(s) Assessment (bed, cushion, seat, etc.)  0 INTERVENTIONS - Wound Cleansing / Measurement X - Simple Wound Cleansing - one wound 1 5 []  - Complex Wound Cleansing - multiple wounds 0 X - Wound Imaging (  photographs - any number of wounds) 1 5 []  - Wound Tracing (instead of photographs) 0 X - Simple Wound Measurement - one wound 1 5 []  - Complex Wound Measurement - multiple wounds 0 INTERVENTIONS - Wound Dressings X - Small Wound Dressing one or multiple wounds 1 10 []  - Medium Wound Dressing one or multiple wounds 0 []  - Large Wound Dressing one or multiple wounds 0 []  - Application of Medications - topical 0 []  - Application of Medications - injection 0 INTERVENTIONS - Miscellaneous []  - External ear exam 0 EFTON, THOMLEY ([id number]) []  - Specimen Collection (cultures, biopsies, blood, body fluids, etc.) 0 []  - Specimen(s) / Culture(s) sent or taken to Lab for analysis 0 []  - Patient Transfer (multiple staff / Harrel Lemon Lift / Similar devices) 0 []  - Simple Staple / Suture removal (25 or less) 0 []  - Complex Staple / Suture removal (26 or more) 0 []  - Hypo / Hyperglycemic Management (close monitor of Blood Glucose) 0 []  - Ankle / Brachial Index (ABI) - do not check if billed separately 0 X - Vital Signs 1 5 Has the patient been seen at the hospital within the last three years: Yes Total Score: 80 Level Of Care: New/Established - Level 3 Electronic Signature(s) Signed: 02/07/2017 4:34:27 PM By: [name] Entered By: [name] on 02/07/2017 12:25:16 Andrew Lyons ([id number]) -------------------------------------------------------------------------------- Encounter Discharge Information Details Patient Name: Andrew Lyons Date of Service: 02/07/2017 11:00 AM Medical Record Number: [id number] Patient Account Number: [id number] Date of Birth/Sex: 1945-10-22 (71 y.o. Male) Treating RN: [name] Primary Care Aniyha Tate: [name], Delfino Lovett Other Clinician: Referring Rockland Kotarski: [name] Treating Aleina Burgio/Extender: [name] in Treatment: 4 Encounter Discharge Information Items Discharge Pain Level: 0 Discharge Condition: Stable Ambulatory Status: Walker Discharge Destination: Home Transportation: Private Auto Accompanied By: dtr Schedule Follow-up Appointment: Yes Medication Reconciliation completed No and provided to Patient/Care Carianna Lague: Provided on Clinical Summary of Care: 02/07/2017 Form Type Recipient Paper Patient KA Electronic Signature(s) Signed: 02/07/2017 11:34:43 AM By: Ruthine Dose Entered By: Ruthine Dose on 02/07/2017 11:34:43 Andrew Lyons ([id number]) -------------------------------------------------------------------------------- Lower Extremity Assessment Details Patient Name: Andrew Lyons Date of Service: 02/07/2017 11:00 AM Medical Record Number: [id number] Patient Account Number: [id number] Date of Birth/Sex: 11-13-1945 (71 y.o. Male) Treating RN: [name] Primary Care Keiarah Orlowski: [name], Delfino Lovett Other Clinician: Referring Myking Sar: [name], [name] Treating Demaris Leavell/Extender: [name] in Treatment: 4 Vascular Assessment Pulses: Posterior Tibial Extremity colors, hair growth, and conditions: Extremity Color: [Right:Normal] Hair Growth on Extremity: [Right:No] Temperature of Extremity: [Right:Warm] Capillary Refill: [Right:> 3 seconds] Electronic Signature(s) Signed: 02/07/2017 4:34:27 PM By: [name] Entered By: [name] on 02/07/2017 11:23:04 Andrew Lyons ([id number]) -------------------------------------------------------------------------------- Multi Wound Chart Details Patient Name: Andrew Lyons Date of Service: 02/07/2017 11:00 AM Medical Record Number: [id number] Patient Account Number: [id number] Date of Birth/Sex: 03-Aug-1946 (71 y.o. Male) Treating RN: [name] Primary Care Sanders Manninen: [name], Delfino Lovett Other  Clinician: Referring Burt Piatek: [name], [name] Treating Trace Wirick/Extender: [name] in Treatment: 4 Vital Signs Height(in): 70 Pulse(bpm): 66 Weight(lbs): 266.5 Blood Pressure 149/68 (mmHg): Body Mass Index(BMI): 38 Temperature(F): 98.2 Respiratory Rate 18 (breaths/min): Photos: [1:No Photos] [N/A:N/A] Wound Location: [1:Right Amputation Site - Below Knee] [N/A:N/A] Wounding Event: [1:Gradually Appeared] [N/A:N/A] Primary Etiology: [1:Atypical] [N/A:N/A] Comorbid History: [1:Cataracts, Chronic Obstructive Pulmonary Disease (COPD), Sleep Apnea, Hypotension, Myocardial Infarction, Type II Diabetes, Osteoarthritis, Neuropathy] [N/A:N/A] Date Acquired: [1:08/26/2016] [N/A:N/A] Weeks of Treatment: [1:4] [N/A:N/A] Wound Status: [1:Open] [  N/A:N/A] Measurements L x W x D 0.5x0.6x0.7 [N/A:N/A] (cm) Area (cm) : [1:0.236] [N/A:N/A] Volume (cm) : [1:0.165] [N/A:N/A] % Reduction in Area: [1:-198.70%] [N/A:N/A] % Reduction in Volume: -132.40% [N/A:N/A] Starting Position 1 10 (o'clock): Ending Position 1 [1:3] (o'clock): Maximum Distance 1 0.5 (cm): Undermining: [1:Yes] [N/A:N/A] Classification: [N/A:N/A] Full Thickness With Exposed Support Structures HBO Classification: Grade 1 N/A N/A Exudate Amount: Large N/A N/A Exudate Type: Serous N/A N/A Exudate Color: amber N/A N/A Wound Margin: Distinct, outline attached N/A N/A Granulation Amount: None Present (0%) N/A N/A Necrotic Amount: Large (67-100%) N/A N/A Exposed Structures: Fat Layer (Subcutaneous N/A N/A Tissue) Exposed: Yes Muscle: Yes Fascia: No Tendon: No Joint: No Bone: No Epithelialization: None N/A N/A Periwound Skin Texture: Callus: Yes N/A N/A Excoriation: No Induration: No Crepitus: No Rash: No Scarring: No Periwound Skin Maceration: No N/A N/A Moisture: Dry/Scaly: No Periwound Skin Color: Atrophie Blanche: No N/A N/A Cyanosis: No Ecchymosis: No Erythema: No Hemosiderin  Staining: No Mottled: No Pallor: No Rubor: No Temperature: No Abnormality N/A N/A Tenderness on Yes N/A N/A Palpation: Wound Preparation: Ulcer Cleansing: N/A N/A Rinsed/Irrigated with Saline Topical Anesthetic Applied: Other: lidocaine 4% Treatment Notes Electronic Signature(s) Signed: 02/07/2017 4:47:06 PM By: Linton Ham MD Andrew Lyons ([id number]) Entered By: Linton Ham on 02/07/2017 12:14:08 Andrew Lyons ([id number]) -------------------------------------------------------------------------------- [place] Details Patient Name: Andrew Lyons Date of Service: 02/07/2017 11:00 AM Medical Record Number: [id number] Patient Account Number: [id number] Date of Birth/Sex: Feb 22, 1946 (71 y.o. Male) Treating RN: [name] Primary Care Kyal Arts: [name], Delfino Lovett Other Clinician: Referring Neomia Herbel: [name] Treating Rainen Vanrossum/Extender: [name] in Treatment: 4 Active Inactive ` Abuse / Safety / Falls / Self Care Management Nursing Diagnoses: Potential for falls Goals: Patient will remain injury free Date Initiated: 01/04/2017 Target Resolution Date: 04/01/2017 Goal Status: Active Interventions: Assess fall risk on admission and as needed Assess self care needs on admission and as needed Notes: ` Orientation to the Wound Care Program Nursing Diagnoses: Knowledge deficit related to the wound healing center program Goals: Patient/caregiver will verbalize understanding of the [place] Program Date Initiated: 01/04/2017 Target Resolution Date: 01/28/2017 Goal Status: Active Interventions: Provide education on orientation to the wound center Notes: ` Wound/Skin Impairment Nursing Diagnoses: Impaired tissue integrity PARKER, WHERLEY ([id number]) Knowledge deficit related to ulceration/compromised skin integrity Goals: Ulcer/skin breakdown will have a volume reduction of 80% by  week 12 Date Initiated: 01/04/2017 Target Resolution Date: 04/22/2017 Goal Status: Active Interventions: Assess patient/caregiver ability to perform ulcer/skin care regimen upon admission and as needed Assess ulceration(s) every visit Notes: Electronic Signature(s) Signed: 02/07/2017 4:34:27 PM By: [name] Entered By: [name] on 02/07/2017 11:23:22 Andrew Lyons ([id number]) -------------------------------------------------------------------------------- Pain Assessment Details Patient Name: Andrew Lyons Date of Service: 02/07/2017 11:00 AM Medical Record Number: [id number] Patient Account Number: [id number] Date of Birth/Sex: 1946-02-06 (71 y.o. Male) Treating RN: [name] Primary Care Omni Dunsworth: [name] Other Clinician: Referring Joselynn Amoroso: [name] Treating Talitha Dicarlo/Extender: [name] in Treatment: 4 Active Problems Location of Pain Severity and Description of Pain Patient Has Paino No Site Locations Pain Management and Medication Current Pain Management: Notes Topical or injectable lidocaine is offered to patient for acute pain when surgical debridement is performed. If needed, Patient is instructed to use over the counter pain medication for the following 24-48 hours after debridement. Wound care MDs do not prescribed pain medications. Patient has chronic pain or uncontrolled pain. Patient has been instructed  to make an appointment with their Primary Care Physician for pain management. Electronic Signature(s) Signed: 02/07/2017 4:34:27 PM By: [name] Entered By: [name] on 02/07/2017 11:04:46 Andrew Lyons ([id number]) -------------------------------------------------------------------------------- Patient/Caregiver Education Details [name] Date of Service: 02/07/2017 11:00 AM Patient Name: T. Patient Account Number: [id number] Medical Record Treating RN: [name] [id number] Number: Other Clinician: Date of Birth/Gender: Jan 08, 1946 (70 y.o. Male) Treating ROBSON, Index Primary Care Physician: [name], Delfino Lovett Physician/Extender: G Referring Physician: [name] in Treatment: 4 Education Assessment Education Provided To: Patient Education Topics Provided Wound/Skin Impairment: Handouts: Other: wound care as ordered Methods: Demonstration, Explain/Verbal Responses: State content correctly Electronic Signature(s) Signed: 02/07/2017 4:34:27 PM By: [name] Entered By: [name] on 02/07/2017 11:24:14 Andrew Lyons ([id number]) -------------------------------------------------------------------------------- Wound Assessment Details Patient Name: Andrew Lyons Date of Service: 02/07/2017 11:00 AM Medical Record Number: [id number] Patient Account Number: [id number] Date of Birth/Sex: 1946-02-15 (71 y.o. 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 Wound Open Status: Wounding Event: Gradually Appeared Comorbid Cataracts, Chronic Obstructive Date Acquired: 08/26/2016 History: Pulmonary Disease (COPD), Sleep Weeks Of Treatment: 4 Apnea, Hypotension, Myocardial Clustered Wound: No Infarction, Type II Diabetes, Osteoarthritis, Neuropathy Photos Photo Uploaded By: [name] on 02/07/2017 14:58:52 Wound Measurements Length: (cm) 0.5 Width: (cm) 0.6 Depth: (cm) 0.7 Area: (cm) 0.236 Volume: (cm) 0.165 % Reduction in Area: -198.7% % Reduction in Volume: -132.4% Epithelialization: None Tunneling: No Undermining: Yes Starting Position (o'clock): 10 Ending Position (o'clock): 3 Maximum Distance: (cm) 0.5 Wound  Description Full Thickness With Exposed Classification: Support Structures Diabetic Severity Grade 1 (Wagner): Wound Margin: Distinct, outline attached ROCHELLE, NEPHEW ([id number]) Foul Odor After Cleansing: No Slough/Fibrino Yes Exudate Amount: Large Exudate Type: Serous Exudate Color: amber Wound Bed Granulation Amount: None Present (0%) Exposed Structure Necrotic Amount: Large (67-100%) Fascia Exposed: No Necrotic Quality: Adherent Slough Fat Layer (Subcutaneous Tissue) Exposed: Yes Tendon Exposed: No Muscle Exposed: Yes Necrosis of Muscle: Yes Joint Exposed: No Bone Exposed: No Periwound Skin Texture Texture Color No Abnormalities Noted: No No Abnormalities Noted: No Callus: Yes Atrophie Blanche: No Crepitus: No Cyanosis: No Excoriation: No Ecchymosis: No Induration: No Erythema: No Rash: No Hemosiderin Staining: No Scarring: No Mottled: No Pallor: No Moisture Rubor: No No Abnormalities Noted: No Dry / Scaly: No Temperature / Pain Maceration: No Temperature: No Abnormality Tenderness on Palpation: Yes Wound Preparation Ulcer Cleansing: Rinsed/Irrigated with Saline Topical Anesthetic Applied: Other: lidocaine 4%, Treatment Notes Wound #1 (Right Amputation Site - Below Knee) 1. Cleansed with: Clean wound with Normal Saline 2. Anesthetic Topical Lidocaine 4% cream to wound bed prior to debridement 4. Dressing Applied: Other dressing (specify in notes) 5. Secondary Dressing Applied Bordered Foam Dressing Notes ColActive Plus Ag JESS, TONEY ([id number]) Electronic Signature(s) Signed: 02/07/2017 4:34:27 PM By: [name] Entered By: [name] on 02/07/2017 11:22:39 Andrew Lyons ([id number]) -------------------------------------------------------------------------------- Vitals Details Patient Name: Andrew Lyons Date of Service: 02/07/2017 11:00 AM Medical Record Number: [id number] Patient Account Number:  [id number] Date of Birth/Sex: January 09, 1946 (71 y.o. Male) Treating RN: [name] Primary Care Hilberto Burzynski: [name], Delfino Lovett Other Clinician: Referring Kensey Luepke: [name] Treating Altha Sweitzer/Extender: [name] in Treatment: 4 Vital Signs Time Taken: 11:04 Temperature (F): 98.2 Height (in): 70 Pulse (bpm): 66 Weight (lbs): 266.5 Respiratory  Rate (breaths/min): 18 Body Mass Index (BMI): 38.2 Blood Pressure (mmHg): 149/68 Reference Range: 80 - 120 mg / dl Electronic Signature(s) Signed: 02/07/2017 4:34:27 PM By: [name] Entered By: [name] on 02/07/2017 11:05:42

## 2017-02-08 NOTE — Progress Notes (Signed)
Andrew Lyons ([id number]) Visit Report for 02/07/2017 Chief Complaint Document Details Andrew Lyons Date of Service: 02/07/2017 11:00 AM Patient Name: T. Patient Account Number: [id number] Medical Record Treating RN: Andrew Lyons [id number] Number: Other Clinician: Date of Birth/Sex: [birth year] (70 y.o. 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 Electronic Signature(s) Signed: 02/07/2017 4:47:06 PM By: Andrew Ham MD Entered By: Andrew Lyons on 02/07/2017 12:14:17 Andrew Lyons ([id number]) -------------------------------------------------------------------------------- HPI Details Andrew Lyons Date of Service: 02/07/2017 11:00 AM Patient Name: T. Patient Account Number: [id number] Medical Record Treating RN: Andrew Lyons [id number] Number: Other Clinician: Date of Birth/Sex: 30-Mar-1946 (70 y.o. Male) Treating Maurita Havener, [place] Primary Care Provider: Wilhemena Lyons Provider/Extender: G Referring Provider: Quintella Lyons in [place]: 4 History of Present Illness HPI Description: 01/04/17; this is a 71 year old diabetic man who has a remote history of a traumatic lower extremity damage at age 40 requiring an amputation. He tells me spent 2 years walking on crutches then ultimately has been walking on prosthesis without any trouble since then. Several months ago he had a new prosthesis and developed an open area in the right stump in December. He saw podiatry Andrew Lyons who noted that this was really out of his practice jurisdiction and referred him here. He has been using mupirocin. He has continued to walk on the prosthesis. The prosthesis itself as been adjusted by  the prostatitis. He does not have a known arterial issue. He tells me he probably has diabetic neuropathy. He had a wound that took a long time to heal surrounding the actual amputation itself however is not had more recent wounds. 01/10/17 Culture from last week grew pseudomonas. change AB to cefdinir yesterday from doxy. he is not using his prosthesis which I emphasized although he is having it adjusted next week 01/17/17; he is completing his antibiotics in the next day or 2. We've been using silver alginate. His prosthesis is away being readjusted. He had an x-ray of the underlying bone here apparently at his podiatrist's office although I'll need to have a look at that and we can't find the result then he may need this re-x-rayed. 01/24/17; he has completed his antibiotics. Change to Silver collagen last week. No real change in the wound this week. 01/31/17 repeat culture I did last week showed a few Pseudomonas. This only has intermediate sensitivity to quinolones I therefore put him back on a week of Cefdinir. X-ray of the stump did not show any bone destruction. We are attempting to get vascular studies done through Andrew Lyons's office on May 23. 02/07/17; arterial studies are booked for May 23 no major change. He has completed his antibiotics Electronic Signature(s) Signed: 02/07/2017 4:47:06 PM By: Andrew Ham MD Entered By: Andrew Lyons on 02/07/2017 12:15:04 Andrew Lyons ([id number]) -------------------------------------------------------------------------------- Physical Exam Details Andrew Lyons Date of Service: 02/07/2017 11:00 AM Patient Name: T. Patient Account Number: [id number] Medical Record Treating RN: Andrew Lyons [id number] Number: Other Clinician: Date of Birth/Sex: 31-Jan-1946 (70 y.o. Male) Treating Andrew Lyons Primary Care Provider: Cranford Lyons, Andrew Lyons Provider/Extender: G Referring Provider: Cranford Lyons, Andrew Lyons in [place]:  4 Constitutional Patient is hypertensive.. Pulse regular and within target range for patient.Marland Kitchen Respirations regular, non-labored and within target range.. Temperature is normal and within the target range for the patient.Marland Kitchen  Patient's appearance is neat and clean. Appears in no acute distress. Well nourished and well developed.Marland Kitchen Respiratory Respiratory effort is easy and symmetric bilaterally. Rate is normal at rest and on room air.. Cardiovascular Paps of faint popliteal pulse. Lymphatic None palpable in the right popliteal area.. Integumentary (Hair, Skin) There is no surrounding erythema around the small punched out wound. No tenderness no crepitus. Notes Wound exam; small punched out area on the right stump. The base of what I can see in this area looks stable. There is no palpable bone. There is no surrounding erythema and no tenderness however there is certainly no granulation either Electronic Signature(s) Signed: 02/07/2017 4:47:06 PM By: Andrew Ham MD Entered By: Andrew Lyons on 02/07/2017 12:19:42 Andrew Lyons ([id number]) -------------------------------------------------------------------------------- Physician Orders Details Andrew Lyons Date of Service: 02/07/2017 11:00 AM Patient Name: T. Patient Account Number: [id number] Medical Record Treating RN: Andrew Lyons [id number] Number: Other Clinician: Date of Birth/Sex: 02-23-1946 (70 y.o. Male) Treating Roselynn Whitacre, [place] Primary Care Provider: Cranford Lyons, Andrew Lyons Provider/Extender: G Referring Provider: Quintella Lyons in [place]: 4 Verbal / Phone Orders: No Diagnosis Coding Wound Cleansing Wound #1 Right Amputation Site - Below Knee o Clean wound with Normal Saline. o Cleanse wound with mild soap and water Anesthetic Wound #1 Right Amputation Site - Below Knee o Topical Lidocaine 4% cream applied to wound bed prior to debridement Skin Barriers/Peri-Wound Care Wound #1 Right  Amputation Site - Below Knee o Skin Prep Primary Wound Dressing Wound #1 Right Amputation Site - Below Knee o Prisma Ag - patient given ColActive Plus Ag this week Secondary Dressing Wound #1 Right Amputation Site - Below Knee o Dry Gauze o Boardered Foam Dressing Dressing Change Frequency Wound #1 Right Amputation Site - Below Knee o Change dressing every day. Follow-up Appointments Wound #1 Right Amputation Site - Below Knee o Return Appointment in 1 week. Edema Control Wound #1 Right Amputation Site - Below Knee o Elevate legs to the level of the heart and pump ankles as often as possible Andrew Lyons, Andrew T. ([id number]) Off-Loading o Other: - do not wear your prosthesis Additional Orders / Instructions Wound #1 Right Amputation Site - Below Knee o Increase protein intake. Medications-please add to medication list. Wound #1 Right Amputation Site - Below Knee o Other: - vitamin C, zinc, MVI Electronic Signature(s) Signed: 02/07/2017 4:34:27 PM By: Andrew Lyons Signed: 02/07/2017 4:47:06 PM By: Andrew Ham MD Entered By: Andrew Lyons on 02/07/2017 11:25:24 Andrew Lyons ([id number]) -------------------------------------------------------------------------------- Problem List Details Andrew Lyons Date of Service: 02/07/2017 11:00 AM Patient Name: T. Patient Account Number: [id number] Medical Record Treating RN: Andrew Lyons [id number] Number: Other Clinician: Date of Birth/Sex: [birth year] (70 y.o. 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 Patient Account Number: [id number] Medical Record Treating RN: Andrew Lyons [id number] Number: Other Clinician: Date of Birth/Sex: 24-Aug-1946 (70  y.o. 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 History of Present Illness (HPI) 01/04/17; this is a 71 year old diabetic man who has a remote history of a traumatic lower extremity damage at age 39 requiring an amputation. He tells me spent 2 years walking on crutches then ultimately has been walking on prosthesis without any trouble since then. Several months ago he had a new prosthesis and developed an open area in the right stump in December. He saw podiatry Andrew Lyons who noted that this was really out of his practice jurisdiction and referred him here. He has been using mupirocin. He has continued to walk on the prosthesis. The prosthesis itself as been adjusted by the prostatitis. He does not have a known arterial issue. He tells me he probably has diabetic neuropathy. He had a wound that took a long time to heal surrounding the actual amputation itself however is not had more recent wounds. 01/10/17 Culture from last week grew pseudomonas. change AB to cefdinir yesterday from  doxy. he is not using his prosthesis which I emphasized although he is having it adjusted next week 01/17/17; he is completing his antibiotics in the next day or 2. We've been using silver alginate. His prosthesis is away being readjusted. He had an x-ray of the underlying bone here apparently at his podiatrist's office although I'll need to have a look at that and we can't find the result then he may need this re-x-rayed. 01/24/17; he has completed his antibiotics. Change to Silver collagen last week. No real change in the wound this week. 01/31/17 repeat culture I did last week showed a few Pseudomonas. This only has intermediate sensitivity to quinolones I therefore put him back on a week of Cefdinir. X-ray of the stump did not show any bone destruction. We are attempting to get vascular studies done through Andrew Lyons's office on May 23. 02/07/17; arterial studies are booked for May 23 no major change. He has completed his antibiotics Objective Constitutional Andrew Lyons, Andrew ([id number]) Patient is hypertensive.. Pulse regular and within target range for patient.Marland Kitchen Respirations regular, non-labored and within target range.. Temperature is normal and within the target range for the patient.. Patient's appearance is neat and clean. Appears in no acute distress. Well nourished and well developed.. Vitals Time Taken: 11:04 AM, Height: 70 in, Weight: 266.5 lbs, BMI: 38.2, Temperature: 98.2 F, Pulse: 66 bpm, Respiratory Rate: 18 breaths/min, Blood Pressure: 149/68 mmHg. Respiratory Respiratory effort is easy and symmetric bilaterally. Rate is normal at rest and on room air.. Cardiovascular Paps of faint popliteal pulse. Lymphatic None palpable in the right popliteal area.. General Notes: Wound exam; small punched out area on the right stump. The base of what I can see in this area looks stable. There is no palpable bone. There is no surrounding erythema and no tenderness however there is  certainly no granulation either Integumentary (Hair, Skin) There is no surrounding erythema around the small punched out wound. No tenderness no crepitus. Wound #1 status is Open. Original cause of wound was Gradually Appeared. 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 The wound measures 0.5cm length x 0.6cm width x 0.7cm depth; 0.236cm^2 area and 0.165cm^3 volume. There is muscle and Fat Layer (Subcutaneous Tissue) Exposed exposed. There is no tunneling  noted, however, there is undermining starting at 10:00 and ending at 3:00 with a maximum distance of 0.5cm. There is a large amount of serous drainage noted. The wound margin is distinct with the outline attached to the wound base. There is no granulation within the wound bed. There is a large (67-100%) amount of necrotic tissue within the wound bed including Adherent Slough and Necrosis of Muscle. The periwound skin appearance exhibited: Callus. The periwound skin appearance did not exhibit: Crepitus, Excoriation, Induration, Rash, Scarring, Dry/Scaly, Maceration, Atrophie Blanche, Cyanosis, Ecchymosis, Hemosiderin Staining, Mottled, Pallor, Rubor, Erythema. Periwound temperature was noted as No Abnormality. The periwound has tenderness on palpation. 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 ([id number]) Plan Wound Cleansing: Wound #1 Right Amputation Site - Below Knee: Clean wound with Normal Saline. Cleanse wound with mild soap and water Anesthetic: Wound #1 Right Amputation Site - Below Knee: Topical Lidocaine 4% cream applied to wound bed prior to debridement Skin Barriers/Peri-Wound Care: Wound #1 Right Amputation  Site - Below Knee: Skin Prep Primary Wound Dressing: Wound #1 Right Amputation Site - Below Knee: Prisma Ag - patient given ColActive Plus Ag this week Secondary Dressing: Wound #1 Right Amputation Site - Below Knee: Dry Gauze Boardered Foam Dressing Dressing Change Frequency: Wound #1 Right Amputation Site - Below Knee: Change dressing every day. Follow-up Appointments: Wound #1 Right Amputation Site - Below Knee: Return Appointment in 1 week. Edema Control: Wound #1 Right Amputation Site - Below Knee: Elevate legs to the level of the heart and pump ankles as often as possible Off-Loading: Other: - do not wear your prosthesis Additional Orders / Instructions: Wound #1 Right Amputation Site - Below Knee: Increase protein intake. Medications-please add to medication list.: Wound #1 Right Amputation Site - Below Knee: Other: - vitamin C, zinc, MVI Andrew Lyons, Andrew T. ([id number]) #1 I am awaiting arterial studies. Patient has a history of coronary artery disease, has a barely palpable popliteal pulse. #2 I think his infection status is stabilized see no reason for any further cultures or antibiotics at this point #3 if his blood flow is adequate consider Human resources officer) Signed: 02/07/2017 4:47:06 PM By: Andrew Ham MD Entered By: Andrew Lyons on 02/07/2017 12:20:51 Andrew Lyons ([id number]) -------------------------------------------------------------------------------- SuperBill Details Andrew Lyons Date of Service: 02/07/2017 Patient Name: T. Patient Account Number: [id number] Medical Record Treating RN: Andrew Lyons [id number] Number: Other Clinician: Date of Birth/Sex: [birth year] (70 y.o. 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, right lower leg, S88.111D subsequent encounter L03.115 Cellulitis of right lower limb Facility Procedures CPT4 Code: [id number] Description: 99213 - WOUND CARE VISIT-LEV 3 EST PT Modifier: Quantity: 1 Physician Procedures CPT4 Code Description: [id number] - WC PHYS LEVEL 3 - EST PT ICD-10 Description Diagnosis E11.622 Type 2 diabetes mellitus with other skin ulcer L97.313 Non-pressure chronic ulcer of right ankle with necr Modifier: osis of musc Quantity: 1 le Electronic Signature(s) Signed: 02/07/2017 12:25:24 PM By: Andrew Lyons Signed: 02/07/2017  4:47:06 PM By: Andrew Ham MD Entered By: Andrew Lyons on 02/07/2017 12:25:24

## 2017-02-09 ENCOUNTER — Other Ambulatory Visit: Payer: Self-pay | Admitting: Family Medicine

## 2017-02-09 NOTE — Telephone Encounter (Signed)
Pt contacted office for refill request on the following medications:  [facility].  CB#[phone]/MW  VICTOZA 18 MG/3ML SOPN and pt states he also needs the needles to use with this

## 2017-02-09 NOTE — Telephone Encounter (Signed)
Advised patient that needles were refilled in march and Victoza in April . I spoke with Estill Bamberg at [facility] and confirmed that they received this and they have, patient advised=aa

## 2017-02-14 ENCOUNTER — Encounter: Payer: PPO | Admitting: Internal Medicine

## 2017-02-14 DIAGNOSIS — S81801A Unspecified open wound, right lower leg, initial encounter: Secondary | ICD-10-CM | POA: Diagnosis not present

## 2017-02-14 DIAGNOSIS — E11622 Type 2 diabetes mellitus with other skin ulcer: Secondary | ICD-10-CM | POA: Diagnosis not present

## 2017-02-15 NOTE — Progress Notes (Signed)
Andrew, BASNETT ([id number]) Visit Report for 02/14/2017 Arrival Information Details Patient Name: Andrew Lyons, Andrew Lyons Date of Service: 02/14/2017 11:00 AM Medical Record Number: [id number] Patient Account Number: [id number] Date of Birth/Sex: Nov 08, 1945 (71 y.o. Male) Treating RN: [name] Primary Care Malasha Kleppe: [name], Delfino Lovett Other Clinician: Referring Jasmynn Pfalzgraf: [name] Treating Kavan Devan/Extender: [name] in Treatment: 5 Visit Information History Since Last Visit Added or deleted any medications: No Patient Arrived: Other Any new allergies or adverse reactions: No Arrival Time: 11:04 Had a fall or experienced change in No Accompanied By: dtr activities of daily living that may affect Transfer Assistance: None risk of falls: Patient Identification Verified: Yes Signs or symptoms of abuse/neglect since last No Secondary Verification Process Completed: Yes visito Patient Requires Transmission-Based No Hospitalized since last visit: No Precautions: Has Dressing in Place as Prescribed: Yes Patient Has Alerts: Yes Pain Present Now: No Patient Alerts: DM II Electronic Signature(s) Signed: 02/14/2017 3:34:25 PM By: [name] Entered By: [name] on 02/14/2017 11:06:27 Andrew Lyons ([id number]) -------------------------------------------------------------------------------- Encounter Discharge Information Details Patient Name: Andrew Lyons Date of Service: 02/14/2017 11:00 AM Medical Record Number: [id number] Patient Account Number: [id number] Date of Birth/Sex: August 14, 1946 (71 y.o. Male) Treating RN: [name] Primary Care Mckaylin Bastien: [name], Delfino Lovett Other Clinician: Referring Jaiyla Granados: [name] Treating Nial Hawe/Extender: [name] in Treatment: 5 Encounter Discharge Information Items Discharge Pain Level: 0 Discharge Condition: Stable Ambulatory Status: Walker Discharge  Destination: Home Transportation: Private Auto Accompanied By: dtr Schedule Follow-up Appointment: Yes Medication Reconciliation completed No and provided to Patient/Care Brittanie Dosanjh: Provided on Clinical Summary of Care: 02/14/2017 Form Type Recipient Paper Patient KA Electronic Signature(s) Signed: 02/14/2017 11:31:44 AM By: Ruthine Dose Entered By: Ruthine Dose on 02/14/2017 11:31:43 Andrew Lyons ([id number]) -------------------------------------------------------------------------------- Lower Extremity Assessment Details Patient Name: Andrew Lyons Date of Service: 02/14/2017 11:00 AM Medical Record Number: [id number] Patient Account Number: [id number] Date of Birth/Sex: 1946-09-16 (71 y.o. Male) Treating RN: [name] Primary Care Kaydie Petsch: [name], Delfino Lovett Other Clinician: Referring Kailan Laws: [name], [name] Treating Suleyma Wafer/Extender: [name] in Treatment: 5 Vascular Assessment Pulses: Posterior Tibial Extremity colors, hair growth, and conditions: Extremity Color: [Right:Normal] Hair Growth on Extremity: [Right:No] Temperature of Extremity: [Right:Warm] Capillary Refill: [Right:< 3 seconds] Electronic Signature(s) Signed: 02/14/2017 3:34:25 PM By: [name] Entered By: [name] on 02/14/2017 11:12:23 Andrew Lyons ([id number]) -------------------------------------------------------------------------------- Multi Wound Chart Details Patient Name: Andrew Lyons Date of Service: 02/14/2017 11:00 AM Medical Record Number: [id number] Patient Account Number: [id number] Date of Birth/Sex: 06/18/1946 (71 y.o. Male) Treating RN: [name] Primary Care Krishang Reading: [name], Delfino Lovett Other Clinician: Referring Zalaya Astarita: [name], [name] Treating Rehema Muffley/Extender: [name] in Treatment: 5 Vital Signs Height(in): 70 Pulse(bpm): 58 Weight(lbs): 266.5 Blood Pressure 130/57 (mmHg): Body  Mass Index(BMI): 38 Temperature(F): 98.0 Respiratory Rate 18 (breaths/min): Photos: [N/A:N/A] Wound Location: Right Amputation Site - N/A N/A Below Knee Wounding Event: Gradually Appeared N/A N/A Primary Etiology: Atypical N/A N/A Comorbid History: Cataracts, Chronic N/A N/A Obstructive Pulmonary Disease (COPD), Sleep Apnea, Hypotension, Myocardial Infarction, Type II Diabetes, Osteoarthritis, Neuropathy Date Acquired: 08/26/2016 N/A N/A Weeks of Treatment: 5 N/A N/A Wound Status: Open N/A N/A Measurements L x W x D 0.4x0.5x0.7 N/A N/A (cm) Area (cm) : 0.157 N/A N/A Volume (cm) : 0.11 N/A N/A % Reduction in Area: -98.70% N/A N/A % Reduction in Volume: -54.90% N/A N/A Classification: Full Thickness With N/A N/A Exposed Support Structures LONDYN, WOTTON T. ([id number]) HBO  Classification: Grade 1 N/A N/A Exudate Amount: Large N/A N/A Exudate Type: Serous N/A N/A Exudate Color: amber N/A N/A Wound Margin: Distinct, outline attached N/A N/A Granulation Amount: Medium (34-66%) N/A N/A Granulation Quality: Red N/A N/A Necrotic Amount: Medium (34-66%) N/A N/A Exposed Structures: Fat Layer (Subcutaneous N/A N/A Tissue) Exposed: Yes Muscle: Yes Fascia: No Tendon: No Joint: No Bone: No Epithelialization: None N/A N/A Debridement: Debridement ([id number]- N/A N/A 11047) Pre-procedure 11:20 N/A N/A Verification/Time Out Taken: Pain Control: Lidocaine 4% Topical N/A N/A Solution Tissue Debrided: Fibrin/Slough, Callus, N/A N/A Subcutaneous Level: Skin/Subcutaneous N/A N/A Tissue Debridement Area (sq 0.2 N/A N/A cm): Instrument: Curette N/A N/A Bleeding: Minimum N/A N/A Hemostasis Achieved: Pressure N/A N/A Procedural Pain: 0 N/A N/A Post Procedural Pain: 0 N/A N/A Debridement Treatment Procedure was tolerated N/A N/A Response: well Post Debridement 0.4x0.5x0.7 N/A N/A Measurements L x W x D (cm) Post Debridement 0.11 N/A N/A Volume: (cm) Periwound Skin  Texture: Callus: Yes N/A N/A Excoriation: No Induration: No Crepitus: No Rash: No Scarring: No Periwound Skin Maceration: No N/A N/A Moisture: Dry/Scaly: No SWAN, ZAYED ([id number]) Periwound Skin Color: Atrophie Blanche: No N/A N/A Cyanosis: No Ecchymosis: No Erythema: No Hemosiderin Staining: No Mottled: No Pallor: No Rubor: No Temperature: No Abnormality N/A N/A Tenderness on Yes N/A N/A Palpation: Wound Preparation: Ulcer Cleansing: N/A N/A Rinsed/Irrigated with Saline Topical Anesthetic Applied: Other: lidocaine 4% Procedures Performed: Debridement N/A N/A Treatment Notes Wound #1 (Right Amputation Site - Below Knee) 1. Cleansed with: Clean wound with Normal Saline 2. Anesthetic Topical Lidocaine 4% cream to wound bed prior to debridement 4. Dressing Applied: Other dressing (specify in notes) 5. Secondary Dressing Applied Bordered Foam Dressing Dry Gauze Notes ColActive Plus Ag Electronic Signature(s) Signed: 02/14/2017 3:44:58 PM By: Linton Ham MD Entered By: Linton Ham on 02/14/2017 11:51:58 Andrew Lyons ([id number]) -------------------------------------------------------------------------------- [place] Details Patient Name: Andrew Lyons Date of Service: 02/14/2017 11:00 AM Medical Record Number: [id number] Patient Account Number: [id number] Date of Birth/Sex: [birth year] (71 y.o. Male) Treating RN: [name] Primary Care Abigaelle Verley: [name], Delfino Lovett Other Clinician: Referring Finnigan Warriner: [name] Treating Ascencion Coye/Extender: [name] in Treatment: 5 Active Inactive ` Abuse / Safety / Falls / Self Care Management Nursing Diagnoses: Potential for falls Goals: Patient will remain injury free Date Initiated: 01/04/2017 Target Resolution Date: 04/01/2017 Goal Status: Active Interventions: Assess fall risk on admission and as needed Assess self care needs on admission  and as needed Notes: ` Orientation to the Wound Care Program Nursing Diagnoses: Knowledge deficit related to the wound healing center program Goals: Patient/caregiver will verbalize understanding of the [facility] Date Initiated: 01/04/2017 Target Resolution Date: 01/28/2017 Goal Status: Active Interventions: Provide education on orientation to the wound center Notes: ` Wound/Skin Impairment Nursing Diagnoses: Impaired tissue integrity AMISH, MINTZER ([id number]) Knowledge deficit related to ulceration/compromised skin integrity Goals: Ulcer/skin breakdown will have a volume reduction of 80% by week 12 Date Initiated: 01/04/2017 Target Resolution Date: 04/22/2017 Goal Status: Active Interventions: Assess patient/caregiver ability to perform ulcer/skin care regimen upon admission and as needed Assess ulceration(s) every visit Notes: Electronic Signature(s) Signed: 02/14/2017 3:34:25 PM By: [name] Entered By: [name] on 02/14/2017 11:12:30 Andrew Lyons ([id number]) -------------------------------------------------------------------------------- Pain Assessment Details Patient Name: Andrew Lyons Date of Service: 02/14/2017 11:00 AM Medical Record Number: [id number] Patient Account Number: [id number] Date of Birth/Sex: 1946-08-28 (71 y.o. Male) Treating RN: [name] Primary Care Anthoney Sheppard: [name] Other Clinician: Referring  Sharanda Shinault: [name], [name] Treating Alrick Cubbage/Extender: [name] in Treatment: 5 Active Problems Location of Pain Severity and Description of Pain Patient Has Paino No Site Locations Pain Management and Medication Current Pain Management: Notes Topical or injectable lidocaine is offered to patient for acute pain when surgical debridement is performed. If needed, Patient is instructed to use over the counter pain medication for the following 24-48 hours after debridement.  Wound care MDs do not prescribed pain medications. Patient has chronic pain or uncontrolled pain. Patient has been instructed to make an appointment with their Primary Care Physician for pain management. Electronic Signature(s) Signed: 02/14/2017 3:34:25 PM By: [name] Entered By: [name] on 02/14/2017 11:06:49 Andrew Lyons ([id number]) -------------------------------------------------------------------------------- Patient/Caregiver Education Details [name] Date of Service: 02/14/2017 11:00 AM Patient Name: T. Patient Account Number: [id number] Medical Record Treating RN: [name] [id number] Number: Other Clinician: Date of Birth/Gender: 1946-06-02 (70 y.o. Male) Treating Linton Ham Primary Care Physician: [name], Delfino Lovett Physician/Extender: G Referring Physician: [name] in Treatment: 5 Education Assessment Education Provided To: Patient and Caregiver Education Topics Provided Wound/Skin Impairment: Handouts: Other: continue wound care as ordered Methods: Demonstration, Explain/Verbal Responses: State content correctly Electronic Signature(s) Signed: 02/14/2017 3:34:25 PM By: [name] Entered By: [name] on 02/14/2017 11:24:10 Andrew Lyons ([id number]) -------------------------------------------------------------------------------- Wound Assessment Details Patient Name: Andrew Lyons Date of Service: 02/14/2017 11:00 AM Medical Record Number: [id number] Patient Account Number: [id number] Date of Birth/Sex: [birth year] (70 y.o. Male) Treating RN: [name] Primary Care Yobana Culliton: [name], Delfino Lovett Other Clinician: Referring Miraj Truss: [name] Treating Leathie Weich/Extender: [name] in Treatment: 5 Wound Status Wound Number: 1 Primary Atypical Etiology: Wound Location: Right Amputation Site - Below Knee Wound Open Status: Wounding Event: Gradually  Appeared Comorbid Cataracts, Chronic Obstructive Date Acquired: 08/26/2016 History: Pulmonary Disease (COPD), Sleep Weeks Of Treatment: 5 Apnea, Hypotension, Myocardial Clustered Wound: No Infarction, Type II Diabetes, Osteoarthritis, Neuropathy Photos Photo Uploaded By: [name] on 02/14/2017 11:35:51 Wound Measurements Length: (cm) 0.4 Width: (cm) 0.5 Depth: (cm) 0.7 Area: (cm) 0.157 Volume: (cm) 0.11 % Reduction in Area: -98.7% % Reduction in Volume: -54.9% Epithelialization: None Tunneling: No Undermining: No Wound Description Full Thickness With Exposed Classification: Support Structures Diabetic Severity Grade 1 (Wagner): Wound Margin: Distinct, outline attached Exudate Amount: Large Exudate Type: Serous Exudate Color: amber REDMOND, WHITTLEY ([id number]) Foul Odor After Cleansing: No Slough/Fibrino Yes Wound Bed Granulation Amount: Medium (34-66%) Exposed Structure Granulation Quality: Red Fascia Exposed: No Necrotic Amount: Medium (34-66%) Fat Layer (Subcutaneous Tissue) Exposed: Yes Necrotic Quality: Adherent Slough Tendon Exposed: No Muscle Exposed: Yes Necrosis of Muscle: Yes Joint Exposed: No Bone Exposed: No Periwound Skin Texture Texture Color No Abnormalities Noted: No No Abnormalities Noted: No Callus: Yes Atrophie Blanche: No Crepitus: No Cyanosis: No Excoriation: No Ecchymosis: No Induration: No Erythema: No Rash: No Hemosiderin Staining: No Scarring: No Mottled: No Pallor: No Moisture Rubor: No No Abnormalities Noted: No Dry / Scaly: No Temperature / Pain Maceration: No Temperature: No Abnormality Tenderness on Palpation: Yes Wound Preparation Ulcer Cleansing: Rinsed/Irrigated with Saline Topical Anesthetic Applied: Other: lidocaine 4%, Treatment Notes Wound #1 (Right Amputation Site - Below Knee) 1. Cleansed with: Clean wound with Normal Saline 2. Anesthetic Topical Lidocaine 4% cream to wound bed prior to  debridement 4. Dressing Applied: Other dressing (specify in notes) 5. Secondary Dressing Applied Bordered Foam Dressing Dry Gauze Notes ColActive Plus Ag Electronic Signature(s) Signed: 02/14/2017 3:34:25 PM By: [name] Entered By:  [name] on 02/14/2017 11:11:45 MONTREZ, MARIETTA ([id number]) JEFFREY, GRAEFE ([id number]) -------------------------------------------------------------------------------- Vitals Details Patient Name: Andrew Lyons Date of Service: 02/14/2017 11:00 AM Medical Record Number: [id number] Patient Account Number: [id number] Date of Birth/Sex: Jul 18, 1946 (70 y.o. Male) Treating RN: [name] Primary Care Labrian Torregrossa: [name], Delfino Lovett Other Clinician: Referring Sima Lindenberger: [name], [name] Treating Trask Vosler/Extender: [name] in Treatment: 5 Vital Signs Time Taken: 11:06 Temperature (F): 98.0 Height (in): 70 Pulse (bpm): 58 Weight (lbs): 266.5 Respiratory Rate (breaths/min): 18 Body Mass Index (BMI): 38.2 Blood Pressure (mmHg): 130/57 Reference Range: 80 - 120 mg / dl Electronic Signature(s) Signed: 02/14/2017 3:34:25 PM By: [name] Entered By: [name] on 02/14/2017 11:07:06

## 2017-02-15 NOTE — Progress Notes (Signed)
MARQUIE, ADERHOLD ([id number]) Visit Report for 02/14/2017 Chief Complaint Document Details VU, LIEBMAN Date of Service: 02/14/2017 11:00 AM Patient Name: T. Patient Account Number: [id number] Medical Record Treating RN: [name] [id number] Number: Other Clinician: Date of Birth/Sex: Jan 19, 1946 (70 y.o. 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 Electronic Signature(s) Signed: 02/14/2017 3:44:58 PM By: Linton Ham MD Entered By: Linton Ham on 02/14/2017 11:54:39 [name] ([id number]) -------------------------------------------------------------------------------- Debridement Details [name] Date of Service: 02/14/2017 11:00 AM Patient Name: T. Patient Account Number: [id number] Medical Record Treating RN: [name] [id number] Number: Other Clinician: Date of Birth/Sex: [birth year] (70 y.o. Male) Treating Jadaya [place], [place] Primary Care Provider: [name], Delfino Lovett Provider/Extender: G Referring Provider: [name] in Treatment: 5 Debridement Performed for Wound #1 Right Amputation Site - Below Knee Assessment: Performed By: Physician [name], MD Debridement: Debridement Pre-procedure Verification/Time Out Yes - 11:20 Taken: Start Time: 11:20 Pain Control: Lidocaine 4% Topical Solution Level: Skin/Subcutaneous Tissue Total Area Debrided (L x 0.4 (cm) x 0.5 (cm) = 0.2 (cm) W): Tissue and other Viable, Non-Viable, Callus, Fibrin/Slough, Subcutaneous material debrided: Instrument: Curette Bleeding: Minimum Hemostasis Achieved: Pressure End Time: 11:23 Procedural Pain: 0 Post Procedural Pain: 0 Response to Treatment: Procedure was tolerated  well Post Debridement Measurements of Total Wound Length: (cm) 0.4 Width: (cm) 0.5 Depth: (cm) 0.7 Volume: (cm) 0.11 Character of Wound/Ulcer Post Improved Debridement: Post Procedure Diagnosis Same as Pre-procedure Electronic Signature(s) Signed: 02/14/2017 3:34:25 PM By: [name] Signed: 02/14/2017 3:44:58 PM By: Linton Ham MD Entered By: Linton Ham on 02/14/2017 11:52:40 JAMISON, SOWARD ([id number], BHAT ([id number]) -------------------------------------------------------------------------------- HPI Details [name] Date of Service: 02/14/2017 11:00 AM Patient Name: T. Patient Account Number: [id number] Medical Record Treating RN: [name] [id number] Number: Other Clinician: Date of Birth/Sex: Sep 19, 1946 (70 y.o. Male) Treating Kazimir Hartnett, [place] Primary Care Provider: [name] Provider/Extender: G Referring Provider: [name] in Treatment: 5 History of Present Illness HPI Description: 01/04/17; this is a 71 year old diabetic man who has a remote history of a traumatic lower extremity damage at age 82 requiring an amputation. He tells me spent 2 years walking on crutches then ultimately has been walking on prosthesis without any trouble since then. Several months ago he had a new prosthesis and developed an open area in the right stump in December. He saw podiatry Dr. Prudence Davidson who noted that this was really out of his practice jurisdiction and referred him here. He has been using mupirocin. He has continued to walk on the prosthesis. The prosthesis itself as been adjusted by the prostatitis. He does not have a known arterial issue. He tells me he probably has diabetic neuropathy. He had a wound that took a long time to heal surrounding the actual amputation itself however is not had more recent wounds. 01/10/17 Culture from last week grew pseudomonas. change AB to cefdinir yesterday from doxy. he is  not using his prosthesis which I emphasized although he is having it adjusted next week 01/17/17; he is completing his antibiotics in the next day or 2. We've been using silver alginate. His prosthesis is away being readjusted. He had an x-ray of the underlying bone here apparently at his podiatrist's office although I'll need to have a look at that and we  can't find the result then he may need this re-x-rayed. 01/24/17; he has completed his antibiotics. Change to Silver collagen last week. No real change in the wound this week. 01/31/17 repeat culture I did last week showed a few Pseudomonas. This only has intermediate sensitivity to quinolones I therefore put him back on a week of Cefdinir. X-ray of the stump did not show any bone destruction. We are attempting to get vascular studies done through Dr.Arida's office on May 23. 02/07/17; arterial studies are booked for May 23 no major change. He has completed his antibiotics 02/14/17 arterial studies on Thursday 5/23. using collage Electronic Signature(s) Signed: 02/14/2017 3:44:58 PM By: Linton Ham MD Entered By: Linton Ham on 02/14/2017 11:56:17 [name] ([id number]) -------------------------------------------------------------------------------- Physical Exam Details [name] Date of Service: 02/14/2017 11:00 AM Patient Name: T. Patient Account Number: [id number] Medical Record Treating RN: [name] [id number] Number: Other Clinician: Date of Birth/Sex: [birth year] (70 y.o. Male) Treating [name], [place] Primary Care Provider: [name], Delfino Lovett Provider/Extender: G Referring Provider: [name], [name] Weeks in Treatment: 5 Constitutional Sitting or standing Blood Pressure is within target range for patient.. Pulse regular and within target range for patient.Marland Kitchen Respirations regular, non-labored and within target range.. Temperature is normal and within the target range for the patient.. Patient's  appearance is neat and clean. Appears in no acute distress. Well nourished and well developed.. Cardiovascular I do not feel a popliteal pulse. Notes Wound exam; appears to have smaller area and depth. No palpable bone. Using a #3 currete removed callous and necrotic subcutaneous tissue from about wound. Hemostasis with direct pressure Electronic Signature(s) Signed: 02/14/2017 3:44:58 PM By: Linton Ham MD Entered By: Linton Ham on 02/14/2017 12:00:14 [name] ([id number]) -------------------------------------------------------------------------------- Physician Orders Details [name] Date of Service: 02/14/2017 11:00 AM Patient Name: T. Patient Account Number: [id number] Medical Record Treating RN: [name] [id number] Number: Other Clinician: Date of Birth/Sex: 10/23/1945 (70 y.o. Male) Treating Nevaeha Finerty, [place] Primary Care Provider: [name], Delfino Lovett Provider/Extender: G Referring Provider: [name] in Treatment: 5 Verbal / Phone Orders: No Diagnosis Coding Wound Cleansing Wound #1 Right Amputation Site - Below Knee o Clean wound with Normal Saline. o Cleanse wound with mild soap and water Anesthetic Wound #1 Right Amputation Site - Below Knee o Topical Lidocaine 4% cream applied to wound bed prior to debridement Skin Barriers/Peri-Wound Care Wound #1 Right Amputation Site - Below Knee o Skin Prep Primary Wound Dressing Wound #1 Right Amputation Site - Below Knee o Prisma Ag - patient given ColActive Plus Ag this week Secondary Dressing Wound #1 Right Amputation Site - Below Knee o Dry Gauze o Boardered Foam Dressing Dressing Change Frequency Wound #1 Right Amputation Site - Below Knee o Change dressing every day. Follow-up Appointments Wound #1 Right Amputation Site - Below Knee o Return Appointment in 1 week. Edema Control Wound #1 Right Amputation Site - Below Knee o Elevate legs to the level  of the heart and pump ankles as often as possible Cure, Lennart T. ([id number]) Off-Loading o Other: - do not wear your prosthesis Additional Orders / Instructions Wound #1 Right Amputation Site - Below Knee o Increase protein intake. Medications-please add to medication list. Wound #1 Right Amputation Site - Below Knee o Other: - vitamin C, zinc, MVI Electronic Signature(s) Signed: 02/14/2017 3:34:25 PM By: [name] Signed: 02/14/2017 3:44:58 PM By: Linton Ham MD Entered By: [name] on 02/14/2017 11:23:06 [name] ([id number]) -------------------------------------------------------------------------------- Problem List Details [name]  Date of Service: 02/14/2017 11:00 AM Patient Name: T. Patient Account Number: [id number] Medical Record Treating RN: [name] [id number] Number: Other Clinician: Date of Birth/Sex: 06/12/1946 (70 y.o. 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 Patient Account Number: [id number] Medical Record Treating RN: [name] [id number] Number: Other Clinician: Date of Birth/Sex: 08/21/1946 (70 y.o. 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 History of Present Illness (HPI) 01/04/17; this is a 71 year old diabetic man who has a remote history of a traumatic lower extremity damage at age 78 requiring an amputation. He tells me spent 2 years walking on crutches then ultimately has been walking on prosthesis without any trouble since then. Several months ago he had a new prosthesis and developed an open area in the right stump in December. He saw podiatry Dr. Prudence Davidson who noted that this was really out of his practice jurisdiction and referred him here. He has been using mupirocin. He has continued to walk on the prosthesis. The prosthesis itself as been adjusted by the prostatitis. He does not have a known arterial issue. He tells me he probably has diabetic neuropathy. He had a wound that took a long time to heal surrounding the actual amputation itself however is not had more recent wounds. 01/10/17 Culture from last week grew pseudomonas. change AB to cefdinir yesterday from doxy. he is not using his prosthesis which I emphasized although he is having it adjusted next week 01/17/17; he is completing his antibiotics in the next day or 2. We've been using silver alginate. His prosthesis is away being readjusted. He had an x-ray of the underlying bone here apparently at his podiatrist's office although I'll need to have a look at that and we can't find the result then he may need this re-x-rayed. 01/24/17; he has completed his antibiotics. Change to Silver collagen last week. No real change in the wound this week. 01/31/17 repeat culture I did  last week showed a few Pseudomonas. This only has intermediate sensitivity to quinolones I therefore put him back on a week of Cefdinir. X-ray of the stump did not show any bone destruction. We are attempting to get vascular studies done through Dr.Arida's office on May 23. 02/07/17; arterial studies are booked for May 23 no major change. He has completed his antibiotics 02/14/17 arterial studies on Thursday 5/23. using collage Objective SCHNEUR, CROWSON. ([id number]) Constitutional Sitting or standing Blood Pressure is within target range for patient.. Pulse regular and within target range for patient.Marland Kitchen Respirations regular, non-labored and within target range.. Temperature is normal and within the target range for the patient.. Patient's appearance is neat and clean. Appears  in no acute distress. Well nourished and well developed.. Vitals Time Taken: 11:06 AM, Height: 70 in, Weight: 266.5 lbs, BMI: 38.2, Temperature: 98.0 F, Pulse: 58 bpm, Respiratory Rate: 18 breaths/min, Blood Pressure: 130/57 mmHg. Cardiovascular I do not feel a popliteal pulse. General Notes: Wound exam; appears to have smaller area and depth. No palpable bone. Using a #3 currete removed callous and necrotic subcutaneous tissue from about wound. Hemostasis with direct pressure Integumentary (Hair, Skin) Wound #1 status is Open. Original cause of wound was Gradually Appeared. 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 The wound measures 0.4cm length x 0.5cm width x 0.7cm depth; 0.157cm^2 area and 0.11cm^3 volume. There is muscle and Fat Layer (Subcutaneous Tissue) Exposed exposed. There is no tunneling or undermining noted. There is a large amount of serous drainage noted. The wound margin is distinct with the outline attached to the wound base. There is medium (34-66%) red granulation within the wound bed. There is a medium (34-66%) amount of necrotic tissue within the wound bed including  Adherent Slough and Necrosis of Muscle. The periwound skin appearance exhibited: Callus. The periwound skin appearance did not exhibit: Crepitus, Excoriation, Induration, Rash, Scarring, Dry/Scaly, Maceration, Atrophie Blanche, Cyanosis, Ecchymosis, Hemosiderin Staining, Mottled, Pallor, Rubor, Erythema. Periwound temperature was noted as No Abnormality. The periwound has tenderness on palpation. 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 ([id number]) Procedures Wound #1 Pre-procedure diagnosis of Wound #1 is an Atypical located on the Right Amputation Site - Below Knee . There was a Skin/Subcutaneous Tissue Debridement ([id number]) debridement with total area of 0.2 sq cm performed by [name], MD. with the following instrument(s): Curette to remove Viable and Non-Viable tissue/material including Fibrin/Slough, Callus, and Subcutaneous after achieving pain control using Lidocaine 4% Topical Solution. A time out was conducted at 11:20, prior to the start of the procedure. A Minimum amount of bleeding was controlled with Pressure. The procedure was tolerated well with a pain level of 0 throughout and a pain level of 0 following the procedure. Post Debridement Measurements: 0.4cm length x 0.5cm width x 0.7cm depth; 0.11cm^3 volume. Character of Wound/Ulcer Post Debridement is improved. Post procedure Diagnosis Wound #1: Same as Pre-Procedure Plan Wound Cleansing: Wound #1 Right Amputation Site - Below Knee: Clean wound with Normal Saline. Cleanse wound with mild soap and water Anesthetic: Wound #1 Right Amputation Site - Below Knee: Topical Lidocaine 4% cream applied to wound bed  prior to debridement Skin Barriers/Peri-Wound Care: Wound #1 Right Amputation Site - Below Knee: Skin Prep Primary Wound Dressing: Wound #1 Right Amputation Site - Below Knee: Prisma Ag - patient given ColActive Plus Ag this week Secondary Dressing: Wound #1 Right Amputation Site - Below Knee: Dry Gauze Boardered Foam Dressing Dressing Change Frequency: Wound #1 Right Amputation Site - Below Knee: Change dressing every day. Follow-up Appointments: Wound #1 Right Amputation Site - Below Knee: Return Appointment in 1 week. Edema Control: Wound #1 Right Amputation Site - Below Knee: Elevate legs to the level of the heart and pump ankles as often as possible Off-Loading: Other: - do not wear your prosthesis BRAYLYN, KALTER ([id number]) Additional Orders / Instructions: Wound #1 Right Amputation Site - Below Knee: Increase protein intake. Medications-please add  to medication list.: Wound #1 Right Amputation Site - Below Knee: Other: - vitamin C, zinc, MVI still collagen, appears to be improving arterial studies 5/23 Electronic Signature(s) Signed: 02/14/2017 3:44:58 PM By: Linton Ham MD Entered By: Linton Ham on 02/14/2017 12:01:08 [name] ([id number]) -------------------------------------------------------------------------------- SuperBill Details [name] Date of Service: 02/14/2017 Patient Name: T. Patient Account Number: [id number] Medical Record Treating RN: [name] [id number] Number: Other Clinician: Date of Birth/Sex: 1945-12-11 (70 y.o. 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,  right lower leg, S88.111D subsequent encounter L03.115 Cellulitis of right lower limb Facility Procedures CPT4 Code Description: [id number] - DEB SUBQ TISSUE 20 SQ CM/< ICD-10 Description Diagnosis E11.622 Type 2 diabetes mellitus with other skin ulcer L97.313 Non-pressure chronic ulcer of right ankle with necr Modifier: osis of musc Quantity: 1 le Physician Procedures CPT4 Code Description: [id number] - WC PHYS SUBQ TISS 20 SQ CM ICD-10 Description Diagnosis E11.622 Type 2 diabetes mellitus with other skin ulcer L97.313 Non-pressure chronic ulcer of right ankle with necr Modifier: osis of musc Quantity: 1 le Electronic Signature(s) Signed: 02/14/2017 3:44:58 PM By: Linton Ham MD Entered By: Linton Ham on 02/14/2017 12:01:23

## 2017-02-16 ENCOUNTER — Ambulatory Visit (HOSPITAL_COMMUNITY)
Admission: RE | Admit: 2017-02-16 | Discharge: 2017-02-16 | Disposition: A | Discharge status: Home / Self Care | Payer: PPO | Source: Ambulatory Visit | Attending: Cardiology | Admitting: Cardiology

## 2017-02-16 DIAGNOSIS — I739 Peripheral vascular disease, unspecified: Secondary | ICD-10-CM | POA: Diagnosis not present

## 2017-02-21 ENCOUNTER — Encounter: Payer: PPO | Admitting: Internal Medicine

## 2017-02-21 DIAGNOSIS — S81801A Unspecified open wound, right lower leg, initial encounter: Secondary | ICD-10-CM | POA: Diagnosis not present

## 2017-02-21 DIAGNOSIS — E11622 Type 2 diabetes mellitus with other skin ulcer: Secondary | ICD-10-CM | POA: Diagnosis not present

## 2017-02-21 DIAGNOSIS — T8189XA Other complications of procedures, not elsewhere classified, initial encounter: Secondary | ICD-10-CM | POA: Diagnosis not present

## 2017-02-22 ENCOUNTER — Other Ambulatory Visit: Payer: Self-pay | Admitting: Internal Medicine

## 2017-02-22 NOTE — Progress Notes (Signed)
CALUB, TARNOW ([id number]) Visit Report for 02/21/2017 Arrival Information Details Patient Name: Andrew Lyons, Andrew Lyons Date of Service: 02/21/2017 8:45 AM Medical Record Number: [id number] Patient Account Number: [id number] Date of Birth/Sex: [birth year] (70 y.o. Male) Treating RN: Cornell Barman Primary Care Ulani Degrasse: [name], Delfino Lovett Other Clinician: Referring Ahnesti Townsend: [name], [name] Treating Jahmar Mckelvy/Extender: [name] in Treatment: 6 Visit Information History Since Last Visit Added or deleted any medications: No Patient Arrived: Other Any new allergies or adverse reactions: No Arrival Time: 08:54 Had a fall or experienced change in No Accompanied By: wife activities of daily living that may affect Transfer Assistance: None risk of falls: Patient Identification Verified: Yes Signs or symptoms of abuse/neglect since last No Secondary Verification Process Completed: Yes visito Patient Requires Transmission-Based No Hospitalized since last visit: No Precautions: Has Dressing in Place as Prescribed: Yes Patient Has Alerts: Yes Pain Present Now: No Patient Alerts: DM II Electronic Signature(s) Signed: 02/21/2017 5:11:39 PM By: Gretta Cool, BSN, RN, CWS, Kim RN, BSN Entered By: Gretta Cool, BSN, RN, CWS, Kim on 02/21/2017 08:59:07 Andrew Lyons ([id number]) -------------------------------------------------------------------------------- Encounter Discharge Information Details Patient Name: Andrew Lyons Date of Service: 02/21/2017 8:45 AM Medical Record Number: [id number] Patient Account Number: [id number] Date of Birth/Sex: 15-Aug-1946 (71 y.o. Male) Treating RN: Cornell Barman Primary Care Zoiee Wimmer: [name], Delfino Lovett Other Clinician: Referring Domanique Huesman: [name], [name] Treating Cambria Osten/Extender: [name] in Treatment: 6 Encounter Discharge Information Items Discharge Pain Level: 0 Discharge Condition: Stable Ambulatory Status:  Walker Discharge Destination: Home Private Transportation: Auto Accompanied By: wife Schedule Follow-up Appointment: Yes Medication Reconciliation completed and No provided to Patient/Care [place] Lapaglia: Patient Clinical Summary of Care: Declined Electronic Signature(s) Signed: 02/21/2017 5:11:39 PM By: Gretta Cool, BSN, RN, CWS, Kim RN, BSN Previous Signature: 02/21/2017 9:52:43 AM Version By: Ruthine Dose Entered By: Gretta Cool BSN, RN, CWS, Kim on 02/21/2017 09:53:07 Andrew Lyons ([id number]) -------------------------------------------------------------------------------- Lower Extremity Assessment Details Patient Name: Andrew Lyons Date of Service: 02/21/2017 8:45 AM Medical Record Number: [id number] Patient Account Number: [id number] Date of Birth/Sex: 1946-01-22 (70 y.o. Male) Treating RN: Cornell Barman Primary Care Amyrah Pinkhasov: [name], Delfino Lovett Other Clinician: Referring Calea Hribar: [name], [name] Treating Tavoris Brisk/Extender: [name] in Treatment: 6 Edema Assessment Assessed: [Left: No] [Right: No] Edema: [Left: N] [Right: o] Vascular Assessment Pulses: Posterior Tibial Extremity colors, hair growth, and conditions: Extremity Color: [Left:Pale] Hair Growth on Extremity: [Left:Yes] Temperature of Extremity: [Left:Cool] Notes No left foot. 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 Electronic Signature(s) Signed: 02/21/2017 5:11:39 PM By: Gretta Cool, BSN, RN, CWS, Kim RN, BSN Entered By: Gretta Cool, BSN, RN, CWS, Kim on 02/21/2017 09:06:08 Andrew Lyons ([id number]) -------------------------------------------------------------------------------- Multi Wound Chart Details Patient Name: Andrew Lyons Date of Service: 02/21/2017 8:45 AM Medical Record Number: [id number] Patient Account Number: [id number] Date of Birth/Sex: Feb 16, 1946 (70 y.o. Male) Treating RN: Cornell Barman Primary Care Thomson Herbers: [name], Delfino Lovett Other Clinician: Referring Journiee Feldkamp: [name],  [name] Treating Halsey Hammen/Extender: [name] in Treatment: 6 Vital Signs Height(in): 70 Pulse(bpm): 61 Weight(lbs): 266.5 Blood Pressure 135/60 (mmHg): Body Mass Index(BMI): 38 Temperature(F): 98.1 Respiratory Rate 18 (breaths/min): Photos: [N/A:N/A] Wound Location: Right Amputation Site - N/A N/A Below Knee Wounding Event: Gradually Appeared N/A N/A Primary Etiology: Atypical N/A N/A Comorbid History: Cataracts, Chronic N/A N/A Obstructive Pulmonary Disease (COPD), Sleep Apnea, Hypotension, Myocardial Infarction, Type II Diabetes, Osteoarthritis, Neuropathy Date Acquired: 08/26/2016 N/A N/A Weeks of Treatment: 6 N/A N/A Wound Status: Open N/A N/A Measurements L x W x D 0.3x0.5x0.4 N/A  N/A (cm) Area (cm) : 0.118 N/A N/A Volume (cm) : 0.047 N/A N/A % Reduction in Area: -49.40% N/A N/A % Reduction in Volume: 33.80% N/A N/A Classification: Full Thickness With N/A N/A Exposed Support Structures HBO Classification: Grade 1 N/A N/A Exudate Amount: Medium N/A N/A Andrew Lyons, Andrew Lyons ([id number]) Exudate Type: Serous N/A N/A Exudate Color: amber N/A N/A Wound Margin: Distinct, outline attached N/A N/A Granulation Amount: Medium (34-66%) N/A N/A Granulation Quality: Red N/A N/A Necrotic Amount: Medium (34-66%) N/A N/A Exposed Structures: Fat Layer (Subcutaneous N/A N/A Tissue) Exposed: Yes Fascia: No Tendon: No Muscle: No Joint: No Bone: No Epithelialization: None N/A N/A Debridement: Debridement (60109- N/A N/A 11047) Pre-procedure 09:34 N/A N/A Verification/Time Out Taken: Pain Control: Other N/A N/A Tissue Debrided: Callus, Subcutaneous N/A N/A Level: Skin/Subcutaneous N/A N/A Tissue Debridement Area (sq 0.15 N/A N/A cm): Instrument: Curette N/A N/A Bleeding: Minimum N/A N/A Hemostasis Achieved: Pressure N/A N/A Procedural Pain: 0 N/A N/A Post Procedural Pain: 0 N/A N/A Debridement Treatment Procedure was tolerated N/A  N/A Response: well Post Debridement 0.3x0.4x0.5 N/A N/A Measurements L x W x D (cm) Post Debridement 0.047 N/A N/A Volume: (cm) Periwound Skin Texture: Callus: Yes N/A N/A Scarring: Yes Excoriation: No Induration: No Crepitus: No Rash: No Periwound Skin Maceration: No N/A N/A Moisture: Dry/Scaly: No Periwound Skin Color: Ecchymosis: Yes N/A N/A Atrophie Blanche: No Cyanosis: No Erythema: No Hemosiderin Staining: No Andrew Lyons, HASTEN. 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 N/A N/A Procedures Performed: Debridement N/A N/A Treatment Notes Wound #1 (Right Amputation Site - Below Knee) 2. Anesthetic Topical Lidocaine 4% cream to wound bed prior to debridement 4. Dressing Applied: Other dressing (specify in notes) 5. Secondary Dressing Applied Bordered Foam Dressing Notes ColActive Plus Ag Electronic Signature(s) Signed: 02/21/2017 5:12:21 PM By: Linton Ham MD Entered By: Linton Ham on 02/21/2017 10:09:46 Andrew Lyons ([id number]) -------------------------------------------------------------------------------- [place] Details Patient Name: Andrew Lyons Date of Service: 02/21/2017 8:45 AM Medical Record Number: [id number] Patient Account Number: [id number] Date of Birth/Sex: March 07, 1946 (71 y.o. Male) Treating RN: Cornell Barman Primary Care Bona Hubbard: [name], Delfino Lovett Other Clinician: Referring Onalee Steinbach: [name], [name] Treating Nicolemarie Wooley/Extender: [name] in Treatment: 6 Active Inactive ` Abuse / Safety / Falls / Self Care Management Nursing Diagnoses: Potential for falls Goals: Patient will remain injury free Date Initiated: 01/04/2017 Target  Resolution Date: 04/01/2017 Goal Status: Active Interventions: Assess fall risk on admission and as needed Assess self care needs on admission and as needed Notes: ` Orientation to the Wound Care Program Nursing Diagnoses: Knowledge deficit related to the wound healing center program Goals: Patient/caregiver will verbalize understanding of the [facility] Date Initiated: 01/04/2017 Target Resolution Date: 01/28/2017 Goal Status: Active Interventions: Provide education on orientation to the wound center Notes: ` Wound/Skin Impairment Nursing Diagnoses: Impaired tissue integrity KAZMIR, OKI ([id number]) Knowledge deficit related to ulceration/compromised skin integrity Goals: Ulcer/skin breakdown will have a volume reduction of 80% by week 12 Date Initiated: 01/04/2017 Target Resolution Date: 04/22/2017 Goal Status: Active Interventions: Assess patient/caregiver ability to perform ulcer/skin care regimen upon admission and as needed Assess ulceration(s) every visit Notes: Electronic Signature(s) Signed: 02/21/2017 5:11:39 PM By: Gretta Cool, BSN, RN, CWS, Kim RN, BSN Entered By: Gretta Cool, BSN, RN, CWS, Kim on 02/21/2017 09:11:28 Andrew Lyons ([id number]) -------------------------------------------------------------------------------- Pain Assessment Details Patient Name: Andrew Lyons, Andrew Lyons Date of Service: 02/21/2017 8:45 AM Medical Record Number: [id number] Patient Account Number: [id number] Date of Birth/Sex: Feb 17, 1946 (70 y.o. Male) Treating RN: Cornell Barman Primary Care Nakeda Lebron: [name], Delfino Lovett Other Clinician: Referring Akio Hudnall: [name], [name] Treating Virjean Boman/Extender: [name] in Treatment: 6 Active Problems Location of Pain Severity and Description of Pain Patient Has Paino No Site Locations With Dressing Change: No Pain Management and Medication Current Pain Management: Electronic Signature(s) Signed: 02/21/2017  5:11:39 PM By: Gretta Cool, BSN, RN, CWS, Kim RN, BSN Entered By: Gretta Cool, BSN, RN, CWS, Kim on 02/21/2017 08:59:23 Andrew Lyons ([id number]) -------------------------------------------------------------------------------- Patient/Caregiver Education Details Andrew Hides Date of Service: 02/21/2017 8:45 AM Patient Name: T. Patient Account Number: [id number] Medical Record Treating RN: Cornell Barman [id number] Number: Other Clinician: Date of Birth/Gender: [birth year] (70 y.o. Male) Treating Linton Ham Primary Care Physician: [name], Delfino Lovett Physician/Extender: G Referring Physician: [name] in Treatment: 6 Education Assessment Education Provided To: Patient Education Topics Provided Welcome To [facility]: Wound/Skin Impairment: Handouts: Caring for Your Ulcer, Other: continue wound care as prescribed Methods: Demonstration Responses: State content correctly Electronic Signature(s) Signed: 02/21/2017 5:11:39 PM By: Gretta Cool, BSN, RN, CWS, Kim RN, BSN Entered By: Gretta Cool, BSN, RN, CWS, Kim on 02/21/2017 09:53:41 Andrew Lyons ([id number]) -------------------------------------------------------------------------------- Wound Assessment Details Patient Name: Andrew Lyons Date of Service: 02/21/2017 8:45 AM Medical Record Number: [id number] Patient Account Number: [id number] Date of Birth/Sex: [birth year] (71 y.o. Male) Treating RN: Cornell Barman Primary Care Jamacia Jester: [name], Delfino Lovett Other Clinician: Referring Maudine Kluesner: [name], [name] Treating Wilver Tignor/Extender: [name] in Treatment: 6 Wound Status Wound Number: 1 Primary Atypical Etiology: Wound Location: Right Amputation Site - Below Knee Wound Open Status: Wounding Event: Gradually Appeared Comorbid Cataracts, Chronic Obstructive Date Acquired: 08/26/2016 History: Pulmonary Disease (COPD), Sleep Weeks Of Treatment: 6 Apnea, Hypotension, Myocardial Clustered  Wound: No Infarction, Type II Diabetes, Osteoarthritis, Neuropathy Photos Wound Measurements Length: (cm) 0.3 Width: (cm) 0.5 Depth: (cm) 0.4 Area: (cm) 0.118 Volume: (cm) 0.047 % Reduction in Area: -49.4% % Reduction in Volume: 33.8% Epithelialization: None Undermining: No Wound Description Full Thickness With Exposed Foul Odor Af Classification: Support Structures Slough/Fibri Diabetic Severity Grade 1 (Wagner): Wound Margin: Distinct, outline attached Exudate Amount: Medium Exudate Type: Serous Exudate Color: amber ter Cleansing: No no Yes Wound Bed Granulation Amount: Medium (34-66%) Exposed Structure Granulation Quality: Red Fascia Exposed: No Andrew Lyons, FORNWALT. ([id number]) Necrotic Amount: Medium (34-66%) Fat Layer (Subcutaneous Tissue) Exposed: Yes Necrotic Quality: Adherent Slough Tendon Exposed: No Muscle Exposed: No Joint Exposed: No Bone Exposed: No Periwound Skin Texture Texture Color No Abnormalities Noted: No No Abnormalities Noted: No Callus: Yes Atrophie Blanche: No Crepitus: No Cyanosis: No Excoriation: No Ecchymosis: Yes Induration: No Erythema: No Rash: No Hemosiderin Staining: No Scarring: Yes Mottled: No Pallor: No Moisture Rubor: No No Abnormalities Noted: No Dry / Scaly: No Temperature / Pain Maceration: No Temperature: No Abnormality Tenderness on Palpation: Yes Wound Preparation Ulcer Cleansing: Rinsed/Irrigated with Saline Topical Anesthetic Applied: Other: lidocaine 4%, Assessment Notes 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 Treatment Notes Wound #1 (Right Amputation Site - Below Knee) 2. Anesthetic Topical Lidocaine 4% cream to wound bed prior to debridement 4. Dressing Applied: Other dressing (specify in notes) 5. Secondary Dressing Applied Bordered Foam Dressing Notes ColActive Plus Ag Electronic Signature(s) Signed: 02/21/2017 5:11:39 PM By: Gretta Cool, BSN, RN, CWS, Kim RN, BSN Entered By: Gretta Cool, BSN, RN, CWS, Kim on  02/21/2017 09:05:16 Andrew Lyons, Andrew Lyons ([id number]) -------------------------------------------------------------------------------- Vitals Details Patient Name: Andrew Lyons Date of Service: 02/21/2017 8:45 AM Medical Record Number: [id number] Patient Account Number: [id number] Date of Birth/Sex: 03-17-1946 (70 y.o. Male) Treating RN: Cornell Barman Primary Care Collins Dimaria: [name], Delfino Lovett Other Clinician: Referring Ariyannah Pauling: [name], [name] Treating Camila Maita/Extender: [name] in Treatment: 6 Vital Signs Time Taken: 08:59 Temperature (F): 98.1 Height (in): 70 Pulse (bpm): 61 Weight (lbs): 266.5 Respiratory Rate (breaths/min): 18 Body Mass Index (BMI): 38.2 Blood Pressure (mmHg): 135/60 Reference Range: 80 - 120 mg / dl Electronic Signature(s) Signed: 02/21/2017 5:11:39 PM By: Gretta Cool, BSN, RN, CWS, Kim RN, BSN Entered By: Gretta Cool, BSN, RN, CWS, Kim on 02/21/2017 08:59:50

## 2017-02-23 NOTE — Progress Notes (Addendum)
BARTHOLOMEW, RAMESH ([id number]) Visit Report for 02/21/2017 Chief Complaint Document Details [name] Date of Service: 02/21/2017 8:45 AM Patient Name: Andrew Lyons. Patient Account Number: [id number] Medical Record Treating RN: [name] [id number] Number: Other Clinician: Date of Birth/Sex: 1945/12/10 (70 y.o. 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 Electronic Signature(s) Signed: 02/21/2017 5:12:21 PM By: Linton Ham MD Entered By: Linton Ham on 02/21/2017 10:10:08 Andrew Lyons ([id number]) -------------------------------------------------------------------------------- Debridement Details [name] Date of Service: 02/21/2017 8:45 AM Patient Name: Andrew Lyons. Patient Account Number: [id number] Medical Record Treating RN: Cornell Barman [id number] Number: Other Clinician: Date of Birth/Sex: 1946/07/07 (70 y.o. Male) Treating ROBSON, [place] Primary Care Provider: [name], Delfino Lovett Provider/Extender: G Referring Provider: [name] in Treatment: 6 Debridement Performed for Wound #1 Right Amputation Site - Below Knee Assessment: Performed By: Physician [name], MD Debridement: Debridement Pre-procedure Verification/Time Out Yes - 09:34 Taken: Start Time: 09:35 Pain Control: Other : lidocaine 4% Level: Skin/Subcutaneous Tissue Total Area Debrided (L x 0.3 (cm) x 0.5 (cm) = 0.15 (cm) W): Tissue and other Viable, Non-Viable, Callus, Subcutaneous material debrided: Instrument: Curette Bleeding: Minimum Hemostasis Achieved: Pressure End Time: 09:37 Procedural Pain: 0 Post Procedural Pain: 0 Response to Treatment: Procedure was tolerated well Post Debridement  Measurements of Total Wound Length: (cm) 0.3 Width: (cm) 0.4 Depth: (cm) 0.5 Volume: (cm) 0.047 Character of Wound/Ulcer Post Requires Further Debridement Debridement: Post Procedure Diagnosis Same as Pre-procedure Electronic Signature(s) Signed: 02/22/2017 9:26:08 AM By: Gretta Cool, BSN, RN, CWS, Kim RN, BSN Signed: 02/22/2017 4:44:14 PM By: Linton Ham MD Previous Signature: 02/21/2017 5:12:21 PM Version By: Linton Ham MD Andrew Lyons, Andrew Lyons ([id number]) Entered By: Gretta Cool, BSN, RN, CWS, Kim on 02/22/2017 09:22:09 Andrew Lyons, Andrew Lyons ([id number]) -------------------------------------------------------------------------------- HPI Details [name] Date of Service: 02/21/2017 8:45 AM Patient Name: Andrew Lyons. Patient Account Number: [id number] Medical Record Treating RN: [name] [id number] Number: Other Clinician: Date of Birth/Sex: 11-18-1945 (70 y.o. Male) Treating Linton Ham Primary Care Provider: [name] Provider/Extender: G Referring Provider: [name] in Treatment: 6 History of Present Illness HPI Description: 01/04/17; this is a 71 year old diabetic man who has a remote history of a traumatic lower extremity damage at age 73 requiring an amputation. He tells me spent 2 years walking on crutches then ultimately has been walking on prosthesis without any trouble since then. Several months ago he had a new prosthesis and developed an open area in the right stump in December. He saw podiatry Dr. Prudence Davidson who noted that this was really out of his practice jurisdiction and referred him here. He has been using mupirocin. He has continued to walk on the prosthesis. The prosthesis itself as been adjusted by the prostatitis. He does not have a known arterial issue. He tells me he probably has diabetic neuropathy. He had a wound that took a long time to heal surrounding the actual amputation itself however is not had more recent wounds. 01/10/17  Culture from last week grew pseudomonas. change AB to cefdinir yesterday from doxy. he is not using his prosthesis which I emphasized although he is having it adjusted next week 01/17/17; he is completing his antibiotics in the next day or 2. We've been using silver alginate. His prosthesis is away being readjusted. He had an x-ray of the  underlying bone here apparently at his podiatrist's office although I'll need to have a look at that and we can'Andrew Lyons find the result then he may need this re-x-rayed. 01/24/17; he has completed his antibiotics. Change to Silver collagen last week. No real change in the wound this week. 01/31/17 repeat culture I did last week showed a few Pseudomonas. This only has intermediate sensitivity to quinolones I therefore put him back on a week of Cefdinir. X-ray of the stump did not show any bone destruction. We are attempting to get vascular studies done through Dr.Arida's office on May 23. 02/07/17; arterial studies are booked for May 23 no major change. He has completed his antibiotics 02/14/17 arterial studies on Thursday 5/23. using collagen 02/21/17; the patient had his arterial studies. Both his ABIs were good at 1.0 on the right and 1.1 on the left. His abdominal and iliac arteries revealed atherosclerosis without focal stenosis duplex imaging of the right lower leg revealed heterogeneous plaque and biphasic Doppler waveforms from the right CFA to the popliteal artery. The right distal SFA and tibial peroneal trunk velocities were elevated. 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 The proximal right ATA and PTA were patent above the amputation level. We have been using Silver collagen with some improvement in the wound dimensions Electronic Signature(s) Signed: 02/21/2017 5:12:21 PM By: Linton Ham MD Entered By: Linton Ham on 02/21/2017 10:11:33 Andrew Lyons  ([id number]) -------------------------------------------------------------------------------- Physical Exam Details [name] Date of Service: 02/21/2017 8:45 AM Patient Name: Andrew Lyons. Patient Account Number: [id number] Medical Record Treating RN: [name] [id number] Number: Other Clinician: Date of Birth/Sex: 01-May-1946 (71 y.o. Male) Treating Linton Ham Primary Care Provider: [name], Delfino Lovett Provider/Extender: G Referring Provider: [name], [name] Weeks in Treatment: 6 Constitutional Sitting or standing Blood Pressure is within target range for patient.. Pulse regular and within target range for patient.Marland Kitchen Respirations regular, non-labored and within target range.. Temperature is normal and within the target range for the patient.. Cardiovascular I still find this patient's right popliteal pulse to be thready and the stump to be somewhat cool. Notes Wound exam; 0.4 cm of depth which is improved. Once again I remove surface callus and nonvariable subcutaneous tissue. Base of the wound appears to be healthy. Used to #3 curet hemostasis with direct pressure there is no evidence of cellulitis Electronic Signature(s) Signed: 02/21/2017 5:12:21 PM By: Linton Ham MD Entered By: Linton Ham on 02/21/2017 10:12:45 Andrew Lyons ([id number]) -------------------------------------------------------------------------------- Physician Orders Details [name] Date of Service: 02/21/2017 8:45 AM Patient Name: Andrew Lyons. Patient Account Number: [id number] Medical Record Treating RN: Cornell Barman [id number] Number: Other Clinician: Date of Birth/Sex: 11/26/1945 (70 y.o. Male) Treating Linton Ham Primary Care Provider: [name], Delfino Lovett Provider/Extender: G Referring Provider: [name] in Treatment: 6 Verbal / Phone Orders: No Diagnosis Coding Wound Cleansing Wound #1 Right Amputation Site - Below Knee o Clean wound with Normal Saline. o  Cleanse wound with mild soap and water Anesthetic Wound #1 Right Amputation Site - Below Knee o Topical Lidocaine 4% cream applied to wound bed prior to debridement - in clinic only Skin Barriers/Peri-Wound Care Wound #1 Right Amputation Site - Below Knee o Skin Prep Primary Wound Dressing Wound #1 Right Amputation Site - Below Knee o Other: - patient given ColActive Plus Ag this week Secondary Dressing Wound #1 Right Amputation Site - Below Knee o Dry Gauze o Boardered Foam Dressing Dressing Change Frequency Wound #1 Right Amputation Site - Below Knee o Change  dressing every day. Follow-up Appointments Wound #1 Right Amputation Site - Below Knee o Return Appointment in 1 week. Edema Control Wound #1 Right Amputation Site - Below Knee o Elevate legs to the level of the heart and pump ankles as often as possible Andrew Lyons, Andrew Andrew Lyons. ([id number]) Off-Loading o Other: - do not wear your prosthesis Additional Orders / Instructions Wound #1 Right Amputation Site - Below Knee o Increase protein intake. Medications-please add to medication list. Wound #1 Right Amputation Site - Below Knee o Other: - vitamin C, zinc, MVI Services and Therapies o TcPO2, normobaric [employer] o [employer]) Signed: 02/21/2017 5:11:39 PM By: Gretta Cool, BSN, RN, CWS, Kim RN, BSN Signed: 02/21/2017 5:12:21 PM By: Linton Ham MD Entered By: Gretta Cool, BSN, RN, CWS, Kim on 02/21/2017 09:40:28 Andrew Lyons, Andrew Lyons ([id number]) -------------------------------------------------------------------------------- Problem List Details [name] Date of Service: 02/21/2017 8:45 AM Patient Name: Andrew Lyons. Patient Account Number: [id number] Medical Record Treating RN: [name] [id number] Number: Other Clinician: Date of Birth/Sex: 1946-04-13 (70 y.o. 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 Patient Account Number: [id number] Medical Record Treating RN: [name] [id number] Number: Other Clinician: Date of Birth/Sex: Mar 17, 1946 (70 y.o. 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 History of Present Illness (HPI) 01/04/17; this is a 71 year old diabetic man who has a remote history of a traumatic lower extremity damage at age 62 requiring an amputation. He tells me spent 2 years walking on crutches then ultimately has been walking on prosthesis without any trouble since then. Several months ago he had a new prosthesis and developed an open area in the right stump in December. He saw podiatry Dr. Prudence Davidson who noted that this was really out of his practice jurisdiction and referred him here. He has been using mupirocin. He has continued to walk on the prosthesis. The  prosthesis itself as been adjusted by the prostatitis. He does not have a known arterial issue. He tells me he probably has diabetic neuropathy. He had a wound that took a long time to heal surrounding the actual amputation itself however is not had more recent wounds. 01/10/17 Culture from last week grew pseudomonas. change AB to cefdinir yesterday from doxy. he is not using his prosthesis which I emphasized although he is having it adjusted next week 01/17/17; he is completing his antibiotics in the next day or 2. We've been using silver alginate. His prosthesis is away being readjusted. He had an x-ray of the underlying bone here apparently at his podiatrist's office although I'll need to have a look at that and we can'Andrew Lyons find the result then he may need this  re-x-rayed. 01/24/17; he has completed his antibiotics. Change to Silver collagen last week. No real change in the wound this week. 01/31/17 repeat culture I did last week showed a few Pseudomonas. This only has intermediate sensitivity to quinolones I therefore put him back on a week of Cefdinir. X-ray of the stump did not show any bone destruction. We are attempting to get vascular studies done through Dr.Arida's office on May 23. 02/07/17; arterial studies are booked for May 23 no major change. He has completed his antibiotics 02/14/17 arterial studies on Thursday 5/23. using collagen 02/21/17; the patient had his arterial studies. Both his ABIs were good at 1.0 on the right and 1.1 on the left. His abdominal and iliac arteries revealed atherosclerosis without focal stenosis duplex imaging of the right lower leg revealed heterogeneous plaque and biphasic Doppler waveforms from the right CFA to the popliteal artery. The right distal SFA and tibial peroneal trunk velocities were elevated. 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 The proximal right ATA and PTA were patent above the amputation level. We have been using Silver  collagen with some improvement in the wound dimensions TIYON, SANOR Andrew Lyons. ([id number]) Objective Constitutional Sitting or standing Blood Pressure is within target range for patient.. Pulse regular and within target range for patient.Marland Kitchen Respirations regular, non-labored and within target range.. Temperature is normal and within the target range for the patient.. Vitals Time Taken: 8:59 AM, Height: 70 in, Weight: 266.5 lbs, BMI: 38.2, Temperature: 98.1 F, Pulse: 61 bpm, Respiratory Rate: 18 breaths/min, Blood Pressure: 135/60 mmHg. Cardiovascular I still find this patient's right popliteal pulse to be thready and the stump to be somewhat cool. General Notes: Wound exam; 0.4 cm of depth which is improved. Once again I remove surface callus and nonvariable subcutaneous tissue. Base of the wound appears to be healthy. Used to #3 curet hemostasis with direct pressure there is no evidence of cellulitis Integumentary (Hair, Skin) Wound #1 status is Open. Original cause of wound was Gradually Appeared. 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 The wound measures 0.3cm length x 0.5cm width x 0.4cm depth; 0.118cm^2 area and 0.047cm^3 volume. There is Fat Layer (Subcutaneous Tissue) Exposed exposed. There is no undermining noted. There is a medium amount of serous drainage noted. The wound margin is distinct with the outline attached to the wound base. There is medium (34-66%) red granulation within the wound bed. There is a medium (34-66%) amount of necrotic tissue within the wound bed including Adherent Slough. The periwound skin appearance exhibited: Callus, Scarring, Ecchymosis. The periwound skin appearance did not exhibit: Crepitus, Excoriation, Induration, Rash, Dry/Scaly, Maceration, Atrophie Blanche, Cyanosis, Hemosiderin Staining, Mottled, Pallor, Rubor, Erythema. Periwound temperature was noted as No Abnormality. The periwound has tenderness on palpation. General  Notes: Patient has amp at ankle. 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 ([id number]) Procedures Wound #1 Pre-procedure diagnosis of Wound #1 is an Atypical located on the Right Amputation Site - Below Knee . There was a Skin/Subcutaneous Tissue Debridement ([id number]) debridement with total area of 0.15 sq cm performed by [name], MD. with the following instrument(s): Curette to remove Viable and Non-Viable tissue/material including Callus and Subcutaneous after achieving pain control using Other (  lidocaine 4%). A time out was conducted at 09:34, prior to the start of the procedure. A Minimum amount of bleeding was controlled with Pressure. The procedure was tolerated well with a pain level of 0 throughout and a pain level of 0 following the procedure. Post Debridement Measurements: 0.3cm length x 0.4cm width x 0.5cm depth; 0.047cm^3 volume. Character of Wound/Ulcer Post Debridement requires further debridement. Post procedure Diagnosis Wound #1: Same as Pre-Procedure Plan Wound Cleansing: Wound #1 Right Amputation Site - Below Knee: Clean wound with Normal Saline. Cleanse wound with mild soap and water Anesthetic: Wound #1 Right Amputation Site - Below Knee: Topical Lidocaine 4% cream applied to wound bed prior to debridement - in clinic only Skin Barriers/Peri-Wound Care: Wound #1 Right Amputation Site - Below Knee: Skin Prep Primary Wound Dressing: Wound #1 Right Amputation Site - Below Knee: Other: - patient given ColActive Plus Ag this week Secondary Dressing: Wound #1 Right Amputation Site - Below Knee: Dry Gauze Boardered Foam Dressing Dressing Change  Frequency: Wound #1 Right Amputation Site - Below Knee: Change dressing every day. Follow-up Appointments: Wound #1 Right Amputation Site - Below Knee: Return Appointment in 1 week. Edema Control: Wound #1 Right Amputation Site - Below Knee: Elevate legs to the level of the heart and pump ankles as often as possible Andrew Lyons, Andrew Andrew Lyons. ([id number]) Off-Loading: Other: - do not wear your prosthesis Additional Orders / Instructions: Wound #1 Right Amputation Site - Below Knee: Increase protein intake. Medications-please add to medication list.: Wound #1 Right Amputation Site - Below Knee: Other: - vitamin C, zinc, MVI Services and Therapies ordered were: TcPO2, normobaric ordered were: [employer]asis Authorization #1 I'm going to continue with the Silver Collegen but I will run single layer oasis through his insurance I'm going to run single layer oasis through his insurance but continue with the [employer] in for now. Depth is down to 0.4 I'm going to order aTCOM to ensure adequate oxygenation here. If this turns out to be very poor he'll need to see a vascular surgeon. Electronic Signature(s) Signed: 03/03/2017 9:17:46 AM By: Gretta Cool, BSN, RN, CWS, Kim RN, BSN Andrew Lyons, Andrew Lyons ([id number]) Signed: 03/03/2017 5:56:02 PM By: Linton Ham MD Previous Signature: 02/21/2017 5:12:21 PM Version By: Linton Ham MD Entered By: Gretta Cool BSN, RN, CWS, Kim on 03/03/2017 09:17:46 Andrew Lyons, Andrew Lyons ([id number]) -------------------------------------------------------------------------------- SuperBill Details [name] Date of Service: 02/21/2017 Patient Name: Andrew Lyons. Patient Account Number: [id number] Medical Record Treating RN: [name] [id number] Number: Other Clinician: Date of Birth/Sex: 09/27/1945 (70 y.o. 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, right lower leg, S88.111D subsequent encounter L03.115 Cellulitis of right lower limb Facility Procedures CPT4 Code Description: [id number] 11042 - DEB SUBQ TISSUE 20 SQ CM/< ICD-10 Description Diagnosis E11.622 Type 2 diabetes mellitus with other skin ulcer L97.313 Non-pressure chronic ulcer of right ankle with necr Modifier: osis of musc Quantity: 1 le Physician Procedures CPT4 Code Description: [id number] - WC PHYS SUBQ TISS 20 SQ CM ICD-10 Description Diagnosis E11.622 Type 2 diabetes mellitus with other skin ulcer L97.313 Non-pressure chronic ulcer of right ankle with necr Modifier: osis of musc Quantity: 1 le Electronic Signature(s) Signed: 02/21/2017 5:12:21 PM By: Linton Ham MD Entered By: [name],  Michael on 02/21/2017 10:15:14

## 2017-02-24 ENCOUNTER — Ambulatory Visit: Payer: PPO

## 2017-02-24 DIAGNOSIS — I10 Essential (primary) hypertension: Secondary | ICD-10-CM | POA: Diagnosis not present

## 2017-02-24 DIAGNOSIS — I2510 Atherosclerotic heart disease of native coronary artery without angina pectoris: Secondary | ICD-10-CM | POA: Diagnosis not present

## 2017-02-24 DIAGNOSIS — G4733 Obstructive sleep apnea (adult) (pediatric): Secondary | ICD-10-CM | POA: Diagnosis not present

## 2017-02-24 DIAGNOSIS — M79604 Pain in right leg: Secondary | ICD-10-CM | POA: Diagnosis not present

## 2017-02-28 ENCOUNTER — Encounter: Payer: PPO | Attending: Internal Medicine | Admitting: Internal Medicine

## 2017-02-28 DIAGNOSIS — J449 Chronic obstructive pulmonary disease, unspecified: Secondary | ICD-10-CM | POA: Insufficient documentation

## 2017-02-28 DIAGNOSIS — S88111D Complete traumatic amputation at level between knee and ankle, right lower leg, subsequent encounter: Secondary | ICD-10-CM | POA: Insufficient documentation

## 2017-02-28 DIAGNOSIS — Z87891 Personal history of nicotine dependence: Secondary | ICD-10-CM | POA: Insufficient documentation

## 2017-02-28 DIAGNOSIS — L97313 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 of muscle: Secondary | ICD-10-CM | POA: Insufficient documentation

## 2017-02-28 DIAGNOSIS — Z6838 Body mass index (BMI) 38.0-38.9, adult: Secondary | ICD-10-CM | POA: Diagnosis not present

## 2017-02-28 DIAGNOSIS — I252 Old myocardial infarction: Secondary | ICD-10-CM | POA: Insufficient documentation

## 2017-02-28 DIAGNOSIS — G4730 Sleep apnea, unspecified: Secondary | ICD-10-CM | POA: Insufficient documentation

## 2017-02-28 DIAGNOSIS — S81801A Unspecified open wound, right lower leg, initial encounter: Secondary | ICD-10-CM | POA: Diagnosis not present

## 2017-02-28 DIAGNOSIS — Z794 Long term (current) use of insulin: Secondary | ICD-10-CM | POA: Diagnosis not present

## 2017-02-28 DIAGNOSIS — M1990 Unspecified osteoarthritis, unspecified site: Secondary | ICD-10-CM | POA: Diagnosis not present

## 2017-02-28 DIAGNOSIS — X58XXXD Exposure to other specified factors, subsequent encounter: Secondary | ICD-10-CM | POA: Insufficient documentation

## 2017-02-28 DIAGNOSIS — I959 Hypotension, unspecified: Secondary | ICD-10-CM | POA: Insufficient documentation

## 2017-02-28 DIAGNOSIS — E11622 Type 2 diabetes mellitus with other skin ulcer: Secondary | ICD-10-CM | POA: Diagnosis not present

## 2017-02-28 DIAGNOSIS — L03115 Cellulitis of right lower limb: Secondary | ICD-10-CM | POA: Insufficient documentation

## 2017-03-02 NOTE — Progress Notes (Signed)
Andrew Lyons, Andrew Lyons ([id number]) Visit Report for 02/28/2017 Arrival Information Details Patient Name: Andrew Lyons Date of Service: 02/28/2017 9:15 AM Medical Record Number: [id number] Patient Account Number: [id number] Date of Birth/Sex: 20-Jan-1946 (71 y.o. Male) Treating RN: Cornell Barman Primary Care Aquilla Voiles: [name], Delfino Lovett Other Clinician: Jacqulyn Bath Referring Sundae Maners: [name], [name] Treating Dj Senteno/Extender: [name] in Treatment: 7 Visit Information History Since Last Visit Added or deleted any medications: No Patient Arrived: Other Any new allergies or adverse reactions: No Arrival Time: 09:19 Had a fall or experienced change in No Accompanied By: self activities of daily living that may affect Transfer Assistance: None risk of falls: Patient Identification Verified: Yes Signs or symptoms of abuse/neglect since last No Secondary Verification Process Completed: Yes visito Patient Requires Transmission-Based No Hospitalized since last visit: No Precautions: Has Dressing in Place as Prescribed: Yes Patient Has Alerts: Yes Pain Present Now: No Patient Alerts: DM II Notes Patient has a knee scooter. Electronic Signature(s) Signed: 03/01/2017 10:12:42 AM By: Gretta Cool, BSN, RN, CWS, Kim RN, BSN Entered By: Gretta Cool, BSN, RN, CWS, Kim on 02/28/2017 09:20:02 Andrew Lyons ([id number]) -------------------------------------------------------------------------------- Clinic Level of Care Assessment Details Patient Name: Andrew Lyons Date of Service: 02/28/2017 9:15 AM Medical Record Number: [id number] Patient Account Number: [id number] Date of Birth/Sex: 09-Nov-1945 (71 y.o. Male) Treating RN: Cornell Barman Primary Care Annabel Gibeau: [name] Other Clinician: Jacqulyn Bath Referring Kwasi Joung: [name], [name] Treating Micajah Dennin/Extender: [name] in Treatment: 7 Clinic Level of Care Assessment Items TOOL 4  Quantity Score []  - Use when only an EandM is performed on FOLLOW-UP visit 0 ASSESSMENTS - Nursing Assessment / Reassessment []  - Reassessment of Co-morbidities (includes updates in patient status) 0 X - Reassessment of Adherence to Treatment Plan 1 5 ASSESSMENTS - Wound and Skin Assessment / Reassessment []  - Simple Wound Assessment / Reassessment - one wound 0 []  - Complex Wound Assessment / Reassessment - multiple wounds 0 []  - Dermatologic / Skin Assessment (not related to wound area) 0 ASSESSMENTS - Focused Assessment []  - Circumferential Edema Measurements - multi extremities 0 []  - Nutritional Assessment / Counseling / Intervention 0 []  - Lower Extremity Assessment (monofilament, tuning fork, pulses) 0 []  - Peripheral Arterial Disease Assessment (using hand held doppler) 0 ASSESSMENTS - Ostomy and/or Continence Assessment and Care []  - Incontinence Assessment and Management 0 []  - Ostomy Care Assessment and Management (repouching, etc.) 0 PROCESS - Coordination of Care X - Simple Patient / Family Education for ongoing care 1 15 []  - Complex (extensive) Patient / Family Education for ongoing care 0 X - Staff obtains Programmer, systems, Records, Test Results / Process Orders 1 10 []  - Staff telephones HHA, Nursing Homes / Clarify orders / etc 0 []  - Routine Transfer to another Facility (non-emergent condition) 0 Andrew Lyons, Andrew Lyons ([id number]) []  - Routine Hospital Admission (non-emergent condition) 0 []  - New Admissions / Biomedical engineer / Ordering NPWT, Apligraf, etc. 0 []  - Emergency Hospital Admission (emergent condition) 0 X - Simple Discharge Coordination 1 10 []  - Complex (extensive) Discharge Coordination 0 PROCESS - Special Needs []  - Pediatric / Minor Patient Management 0 []  - Isolation Patient Management 0 []  - Hearing / Language / Visual special needs 0 []  - Assessment of Community assistance (transportation, D/C planning, etc.) 0 []  - Additional assistance /  Altered mentation 0 []  - Support Surface(s) Assessment (bed, cushion, seat, etc.) 0 INTERVENTIONS - Wound Cleansing / Measurement X - Simple Wound Cleansing - one  wound 1 5 []  - Complex Wound Cleansing - multiple wounds 0 X - Wound Imaging (photographs - any number of wounds) 1 5 []  - Wound Tracing (instead of photographs) 0 X - Simple Wound Measurement - one wound 1 5 []  - Complex Wound Measurement - multiple wounds 0 INTERVENTIONS - Wound Dressings []  - Small Wound Dressing one or multiple wounds 0 X - Medium Wound Dressing one or multiple wounds 1 15 []  - Large Wound Dressing one or multiple wounds 0 []  - Application of Medications - topical 0 []  - Application of Medications - injection 0 INTERVENTIONS - Miscellaneous []  - External ear exam 0 Andrew Lyons, Andrew Lyons. ([id number]) []  - Specimen Collection (cultures, biopsies, blood, body fluids, etc.) 0 []  - Specimen(s) / Culture(s) sent or taken to Lab for analysis 0 []  - Patient Transfer (multiple staff / Harrel Lemon Lift / Similar devices) 0 []  - Simple Staple / Suture removal (25 or less) 0 []  - Complex Staple / Suture removal (26 or more) 0 []  - Hypo / Hyperglycemic Management (close monitor of Blood Glucose) 0 []  - Ankle / Brachial Index (ABI) - do not check if billed separately 0 X - Vital Signs 1 5 Has the patient been seen at the hospital within the last three years: Yes Total Score: 75 Level Of Care: New/Established - Level 2 Electronic Signature(s) Signed: 03/01/2017 10:12:42 AM By: Gretta Cool, BSN, RN, CWS, Kim RN, BSN Entered By: Gretta Cool, BSN, RN, CWS, Kim on 02/28/2017 10:44:37 Andrew Lyons ([id number]) -------------------------------------------------------------------------------- Encounter Discharge Information Details Patient Name: Andrew Lyons Date of Service: 02/28/2017 9:15 AM Medical Record Number: [id number] Patient Account Number: [id number] Date of Birth/Sex: April 16, 1946 (71 y.o. Male) Treating RN: Cornell Barman Primary Care Illias Pantano: [name], Delfino Lovett Other Clinician: Jacqulyn Bath Referring Yago Ludvigsen: [name], [name] Treating Mable Dara/Extender: [name] in Treatment: 7 Encounter Discharge Information Items Discharge Pain Level: 0 Discharge Condition: Stable Ambulatory Status: Walker Discharge Destination: Home Private Transportation: Auto Accompanied By: self Schedule Follow-up Appointment: Yes Medication Reconciliation completed and Yes provided to Patient/Care Cardin Nitschke: Clinical Summary of Care: Notes Patient has a knee scooter he uses to abulate. Electronic Signature(s) Signed: 02/28/2017 10:46:46 AM By: Gretta Cool, BSN, RN, CWS, Kim RN, BSN Entered By: Gretta Cool, BSN, RN, CWS, Kim on 02/28/2017 10:46:45 Andrew Lyons ([id number]) -------------------------------------------------------------------------------- Lower Extremity Assessment Details Patient Name: Andrew Lyons Date of Service: 02/28/2017 9:15 AM Medical Record Number: [id number] Patient Account Number: [id number] Date of Birth/Sex: May 11, 1946 (71 y.o. 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 Electronic Signature(s) Signed: 03/01/2017 10:12:42 AM By: Gretta Cool, BSN, RN, CWS, Kim RN, BSN Entered By: Gretta Cool, BSN, RN, CWS, Kim on 02/28/2017 09:30:51 Andrew Lyons ([id number]) -------------------------------------------------------------------------------- Multi Wound Chart Details Patient Name: Andrew Lyons Date of Service: 02/28/2017 9:15  AM Medical Record Number: [id number] Patient Account Number: [id number] Date of Birth/Sex: 08/09/1946 (71 y.o. Male) Treating RN: Cornell Barman Primary Care Katana Berthold: [name], Delfino Lovett Other Clinician: Jacqulyn Bath Referring Juliauna Stueve: [name], [name] Treating Dolores Mcgovern/Extender: [name] in Treatment: 7 Vital Signs Height(in): 70 Pulse(bpm): 72 Weight(lbs): 266.5 Blood Pressure 138/60 (mmHg): Body Mass Index(BMI): 38 Temperature(F): 97.6 Respiratory Rate 16 (  breaths/min): Photos: [N/A:N/A] Wound Location: Right Amputation Site - N/A N/A Below Knee Wounding Event: Gradually Appeared N/A N/A Primary Etiology: Atypical N/A N/A Comorbid History: Cataracts, Chronic N/A N/A Obstructive Pulmonary Disease (COPD), Sleep Apnea, Hypotension, Myocardial Infarction, Type II Diabetes, Osteoarthritis, Neuropathy Date Acquired: 08/26/2016 N/A N/A Weeks of Treatment: 7 N/A N/A Wound Status: Open N/A N/A Measurements L x W x D 0.2x0.4x0.7 N/A N/A (cm) Area (cm) : 0.063 N/A N/A Volume (cm) : 0.044 N/A N/A % Reduction in Area: 20.30% N/A N/A % Reduction in Volume: 38.00% N/A N/A Classification: Full Thickness With N/A N/A Exposed Support Structures HBO Classification: Grade 1 N/A N/A Exudate Amount: Medium N/A N/A Andrew Lyons, Andrew Lyons ([id number]) Exudate Type: Serous N/A N/A Exudate Color: amber N/A N/A Wound Margin: Distinct, outline attached N/A N/A Granulation Amount: Large (67-100%) N/A N/A Granulation Quality: Pink, Pale N/A N/A Necrotic Amount: Small (1-33%) N/A N/A Exposed Structures: Fascia: No N/A N/A Fat Layer (Subcutaneous Tissue) Exposed: No Tendon: No Muscle: No Joint: No Bone: No Epithelialization: Medium (34-66%) N/A N/A Periwound Skin Texture: Callus: Yes N/A N/A Scarring: Yes Excoriation: No Induration: No Crepitus: No Rash: No Periwound Skin Maceration: No N/A N/A Moisture: Dry/Scaly: No Periwound Skin Color: Atrophie Blanche:  No N/A N/A Cyanosis: No Ecchymosis: No Erythema: No Hemosiderin Staining: No Mottled: No Pallor: No Rubor: No Temperature: No Abnormality N/A N/A Tenderness on Yes N/A N/A Palpation: Wound Preparation: Ulcer Cleansing: N/A N/A Rinsed/Irrigated with Saline Topical Anesthetic Applied: Other: lidocaine 4% Treatment Notes Electronic Signature(s) Signed: 02/28/2017 10:42:30 AM By: Gretta Cool, BSN, RN, CWS, Kim RN, BSN Entered By: Gretta Cool, BSN, RN, CWS, Kim on 02/28/2017 10:42:30 Andrew Lyons ([id number]) -------------------------------------------------------------------------------- [place] Details Patient Name: Andrew Lyons Date of Service: 02/28/2017 9:15 AM Medical Record Number: [id number] Patient Account Number: [id number] Date of Birth/Sex: 1945/11/03 (71 y.o. Male) Treating RN: Cornell Barman Primary Care January Bergthold: [name], Delfino Lovett Other Clinician: Jacqulyn Bath Referring Payson Evrard: [name], [name] Treating Azahel Belcastro/Extender: [name] in Treatment: 7 Active Inactive ` Abuse / Safety / Falls / Self Care Management Nursing Diagnoses: Potential for falls Goals: Patient will remain injury free Date Initiated: 01/04/2017 Target Resolution Date: 04/01/2017 Goal Status: Active Interventions: Assess fall risk on admission and as needed Assess self care needs on admission and as needed Notes: ` Orientation to the Wound Care Program Nursing Diagnoses: Knowledge deficit related to the wound healing center program Goals: Patient/caregiver will verbalize understanding of the [place] Program Date Initiated: 01/04/2017 Target Resolution Date: 01/28/2017 Goal Status: Active Interventions: Provide education on orientation to the wound center Notes: ` Wound/Skin Impairment Nursing Diagnoses: Impaired tissue integrity Andrew Lyons, Andrew Lyons ([id number]) Knowledge deficit related to ulceration/compromised skin  integrity Goals: Ulcer/skin breakdown will have a volume reduction of 80% by week 12 Date Initiated: 01/04/2017 Target Resolution Date: 04/22/2017 Goal Status: Active Interventions: Assess patient/caregiver ability to perform ulcer/skin care regimen upon admission and as needed Assess ulceration(s) every visit Notes: Electronic Signature(s) Signed: 02/28/2017 10:42:20 AM By: Gretta Cool, BSN, RN, CWS, Kim RN, BSN Entered By: Gretta Cool, BSN, RN, CWS, Kim on 02/28/2017 10:42:19 Andrew Lyons ([id number]) -------------------------------------------------------------------------------- Pain Assessment Details Patient Name: Andrew Lyons Date of Service: 02/28/2017 9:15 AM Medical Record Number: [id number] Patient Account Number: [id number] Date of Birth/Sex: 05/19/1946 (71 y.o. Male) Treating RN: Cornell Barman Primary Care Andrei Mccook: [name], Delfino Lovett Other Clinician: Jacqulyn Bath Referring Miguel Medal: [name], [name] Treating Aljean Horiuchi/Extender: [name] in Treatment: 7 Active Problems Location of Pain Severity  and Description of Pain Patient Has Paino No Site Locations With Dressing Change: No Pain Management and Medication Current Pain Management: Goals for Pain Management Topical or injectable lidocaine is offered to patient for acute pain when surgical debridement is performed. If needed, Patient is instructed to use over the counter pain medication for the following 24-48 hours after debridement. Wound care MDs do not prescribed pain medications. Patient has chronic pain or uncontrolled pain. Patient has been instructed to make an appointment with their Primary Care Physician for pain management. Electronic Signature(s) Signed: 03/01/2017 10:12:42 AM By: Gretta Cool, BSN, RN, CWS, Kim RN, BSN Entered By: Gretta Cool, BSN, RN, CWS, Kim on 02/28/2017 09:20:16 Andrew Lyons  ([id number]) -------------------------------------------------------------------------------- Patient/Caregiver Education Details Andrew Lyons Date of Service: 02/28/2017 9:15 AM Patient Name: T. Patient Account Number: [id number] Medical Record Treating RN: Cornell Barman [id number] Number: Other Clinician: Jacqulyn Bath Date of Birth/Gender: November 18, 1945 (71 y.o. Male) Treating Linton Ham Primary Care Physician: [name], Delfino Lovett Physician/Extender: G Referring Physician: [name] in Treatment: 7 Education Assessment Education Provided To: Patient Education Topics Provided Wound/Skin Impairment: Handouts: Caring for Your Ulcer, Other: continue wound care as prescribed Methods: Demonstration, Explain/Verbal Responses: State content correctly Electronic Signature(s) Signed: 03/01/2017 10:12:42 AM By: Gretta Cool, BSN, RN, CWS, Kim RN, BSN Entered By: Gretta Cool, BSN, RN, CWS, Kim on 02/28/2017 10:47:11 Andrew Lyons ([id number]) -------------------------------------------------------------------------------- Wound Assessment Details Patient Name: Andrew Lyons Date of Service: 02/28/2017 9:15 AM Medical Record Number: [id number] Patient Account Number: [id number] Date of Birth/Sex: 23-Jan-1946 (71 y.o. 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 Wound Open Status: Wounding Event: Gradually Appeared Comorbid Cataracts, Chronic Obstructive Date Acquired: 08/26/2016 History: Pulmonary Disease (COPD), Sleep Weeks Of Treatment: 7 Apnea, Hypotension, Myocardial Clustered Wound: No Infarction, Type II Diabetes, Osteoarthritis, Neuropathy Photos Wound Measurements Length: (cm)  0.2 Width: (cm) 0.4 Depth: (cm) 0.7 Area: (cm) 0.063 Volume: (cm) 0.044 % Reduction in Area: 20.3% % Reduction in Volume: 38% Epithelialization: Medium (34-66%) Tunneling: No Undermining: No Wound Description Full Thickness With Exposed Foul Odor Af Classification: Support Structures Slough/Fibri Diabetic Severity Grade 1 (Wagner): Wound Margin: Distinct, outline attached Exudate Amount: Medium Exudate Type: Serous Exudate Color: amber ter Cleansing: No no Yes Wound Bed Granulation Amount: Large (67-100%) Exposed Structure Granulation Quality: Pink, Pale Fascia Exposed: No Andrew Lyons, Andrew Lyons. ([id number]) Necrotic Amount: Small (1-33%) Fat Layer (Subcutaneous Tissue) Exposed: No Necrotic Quality: Adherent Slough Tendon Exposed: No Muscle Exposed: No Joint Exposed: No Bone Exposed: No Periwound Skin Texture Texture Color No Abnormalities Noted: No No Abnormalities Noted: No Callus: Yes Atrophie Blanche: No Crepitus: No Cyanosis: No Excoriation: No Ecchymosis: No Induration: No Erythema: No Rash: No Hemosiderin Staining: No Scarring: Yes Mottled: No Pallor: No Moisture Rubor: No No Abnormalities Noted: No Dry / Scaly: No Temperature / Pain Maceration: No Temperature: No Abnormality Tenderness on Palpation: Yes Wound Preparation Ulcer Cleansing: Rinsed/Irrigated with Saline Topical Anesthetic Applied: Other: lidocaine 4%, Treatment Notes Wound #1 (Right Amputation Site - Below Knee) 1. Cleansed with: Clean wound with Normal Saline 3. Peri-wound Care: Skin Prep 4. Dressing Applied: Prisma Ag 5. Secondary Dressing Applied Bordered Foam Dressing Electronic Signature(s) Signed: 03/01/2017 10:12:42 AM By: Gretta Cool, BSN, RN, CWS, Kim RN, BSN Entered By: Gretta Cool, BSN, RN, CWS, Kim on 02/28/2017 09:25:26 Andrew Lyons  Darene Lamer ([id number]) -------------------------------------------------------------------------------- Vitals Details Patient Name:  Andrew Lyons Date of Service: 02/28/2017 9:15 AM Medical Record Number: [id number] Patient Account Number: [id number] Date of Birth/Sex: [birth year] (71 y.o. Male) Treating RN: Cornell Barman Primary Care Shila Kruczek: [name], Delfino Lovett Other Clinician: Jacqulyn Bath Referring Izaah Westman: [name], [name] Treating Lawerance Matsuo/Extender: [name] in Treatment: 7 Vital Signs Time Taken: 09:20 Temperature (F): 97.6 Height (in): 70 Pulse (bpm): 72 Weight (lbs): 266.5 Respiratory Rate (breaths/min): 16 Body Mass Index (BMI): 38.2 Blood Pressure (mmHg): 138/60 Reference Range: 80 - 120 mg / dl Electronic Signature(s) Signed: 03/01/2017 10:12:42 AM By: Gretta Cool, BSN, RN, CWS, Kim RN, BSN Entered By: Gretta Cool, BSN, RN, CWS, Kim on 02/28/2017 09:20:53

## 2017-03-02 NOTE — Progress Notes (Signed)
JOLLY, BLEICHER ([id number]) Visit Report for 02/28/2017 Chief Complaint Document Details [name] Date of Service: 02/28/2017 9:15 AM Patient Name: T. Patient Account Number: [id number] Medical Record Treating RN: Cornell Barman [id number] Number: Other Clinician: Jacqulyn Bath Date of Birth/Sex: 10-May-1946 (71 y.o. 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 Electronic Signature(s) Signed: 02/28/2017 5:27:39 PM By: Linton Ham MD Entered By: Linton Ham on 02/28/2017 10:21:01 [name] ([id number]) -------------------------------------------------------------------------------- HPI Details [name] Date of Service: 02/28/2017 9:15 AM Patient Name: T. Patient Account Number: [id number] Medical Record Treating RN: Cornell Barman [id number] Number: Other Clinician: Jacqulyn Bath Date of Birth/Sex: 1946-01-04 (71 y.o. 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 He tells me spent 2 years walking on crutches then ultimately has been walking on prosthesis without any trouble since then. Several months ago he had a new prosthesis and developed an open area in the right stump in December. He saw podiatry Dr. Prudence Davidson who noted that this was really out of his practice jurisdiction and referred him here. He has been using mupirocin. He has continued to walk on the prosthesis. The prosthesis itself as been adjusted  by the prostatitis. He does not have a known arterial issue. He tells me he probably has diabetic neuropathy. He had a wound that took a long time to heal surrounding the actual amputation itself however is not had more recent wounds. 01/10/17 Culture from last week grew pseudomonas. change AB to cefdinir yesterday from doxy. he is not using his prosthesis which I emphasized although he is having it adjusted next week 01/17/17; he is completing his antibiotics in the next day or 2. We've been using silver alginate. His prosthesis is away being readjusted. He had an x-ray of the underlying bone here apparently at his podiatrist's office although I'll need to have a look at that and we can't find the result then he may need this re-x-rayed. 01/24/17; he has completed his antibiotics. Change to Silver collagen last week. No real change in the wound this week. 01/31/17 repeat culture I did last week showed a few Pseudomonas. This only has intermediate sensitivity to quinolones I therefore put him back on a week of Cefdinir. X-ray of the stump did not show any bone destruction. We are attempting to get vascular studies done through Dr.Arida's office on May 23. 02/07/17; arterial studies are booked for May 23 no major change. He has completed his antibiotics 02/14/17 arterial studies on Thursday 5/23. using collagen 02/21/17; the patient had his arterial studies. Both his ABIs were good at 1.0 on the right and 1.1 on the left. His abdominal and iliac arteries revealed atherosclerosis without focal stenosis duplex imaging of the right lower leg revealed heterogeneous plaque and biphasic Doppler waveforms from the right CFA to the popliteal artery. The right distal SFA and tibial peroneal trunk velocities were elevated. 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 The proximal right ATA and PTA were patent above the amputation level. We have been using Silver collagen with some improvement in  the  wound dimensions 02/28/17; we went ahead and did TCOM measurements on this patient. One of the leads was nonfunctional however on the right leg both the proximal and periwound leads registered greater than 70 mmHg oxygen tension. Interestingly the comparison lead on the left leg/relative wound level was in the mid 40s. This allays any fears I had about contributing wound ischemia. In the meantime is measurements today were 0.2 x 0.4 x 0.7 the wound is now a small divot most of it ZADKIEL, DRAGAN T. ([id number]) appears to be epithelialized with only a small open area at that depth. There is no evidence of surrounding infection Electronic Signature(s) Signed: 02/28/2017 5:27:39 PM By: Linton Ham MD Entered By: Linton Ham on 02/28/2017 10:23:07 [name] ([id number]) -------------------------------------------------------------------------------- Physical Exam Details [name] Date of Service: 02/28/2017 9:15 AM Patient Name: T. Patient Account Number: [id number] Medical Record Treating RN: Cornell Barman [id number] Number: Other Clinician: Jacqulyn Bath Date of Birth/Sex: June 07, 1946 (71 y.o. Male) Treating Linton Ham Primary Care Provider: [name] Provider/Extender: G Referring Provider: [name], [name] Weeks in Treatment: 7 Constitutional Sitting or standing Blood Pressure is within target range for patient.. Pulse regular and within target range for patient.Marland Kitchen Respirations regular, non-labored and within target range.. Temperature is normal and within the target range for the patient.Marland Kitchen appears in no distress. Eyes Conjunctivae clear. No discharge. Respiratory Respiratory effort is easy and symmetric bilaterally. Rate is normal at rest and on room air.. Cardiovascular Palpable femoral pulse. Still no palpable popliteal pulse on the right. Lymphatic None palpable in the popliteal or inguinal area. Psychiatric No evidence of depression,  anxiety, or agitation. Calm, cooperative, and communicative. Appropriate interactions and affect.. Notes Wound exam; smaller but deep wound. It appears that there is only a small open area at the depth of this "[facility] shaped" wound. There is no evidence of surrounding infection Electronic Signature(s) Signed: 02/28/2017 5:27:39 PM By: Linton Ham MD Entered By: Linton Ham on 02/28/2017 10:24:50 [name] ([id number]) -------------------------------------------------------------------------------- Physician Orders Details [name] Date of Service: 02/28/2017 9:15 AM Patient Name: T. Patient Account Number: [id number] Medical Record Treating RN: Cornell Barman [id number] Number: Other Clinician: Jacqulyn Bath Date of Birth/Sex: 1946-06-04 (71 y.o. 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 o Cleanse wound with mild soap and water Anesthetic Wound #1 Right Amputation Site - Below Knee o Topical Lidocaine 4% cream applied to wound bed prior to debridement - in clinic only Skin Barriers/Peri-Wound Care Wound #1 Right Amputation Site - Below Knee o Skin Prep Primary Wound Dressing Wound #1 Right Amputation Site - Below Knee o Prisma Ag Secondary Dressing Wound #1 Right Amputation Site - Below Knee o Boardered Foam Dressing Dressing Change Frequency Wound #1 Right Amputation Site - Below  Knee o Change dressing every other day. HERMINIO, KNISKERN ([id number]) Follow-up Appointments Wound #1 Right Amputation Site - Below Knee o Return Appointment in 1 week. Edema Control Wound #1 Right Amputation Site - Below Knee o Elevate legs to the level of  the heart and pump ankles as often as possible Off-Loading o Other: - do not wear your prosthesis Additional Orders / Instructions Wound #1 Right Amputation Site - Below Knee o Increase protein intake. Medications-please add to medication list. Wound #1 Right Amputation Site - Below Knee o Other: - vitamin C, zinc, MVI Notes TcPO2, normobaric performed today in clinic. Electronic Signature(s) Signed: 02/28/2017 5:27:39 PM By: Linton Ham MD Signed: 03/01/2017 10:12:42 AM By: Gretta Cool BSN, RN, CWS, Kim RN, BSN Previous Signature: 02/28/2017 10:43:55 AM Version By: Gretta Cool, BSN, RN, CWS, Kim RN, BSN Entered By: Gretta Cool, BSN, RN, CWS, Kim on 02/28/2017 11:40:10 CHEYNE, BOULDEN ([id number]) -------------------------------------------------------------------------------- Problem List Details [name] Date of Service: 02/28/2017 9:15 AM Patient Name: T. Patient Account Number: [id number] Medical Record Treating RN: Cornell Barman [id number] Number: Other Clinician: Jacqulyn Bath Date of Birth/Sex: Jan 15, 1946 (71 y.o. 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 Patient Account Number: [id number] Medical Record Treating RN: Cornell Barman [id number] Number: Other Clinician: Jacqulyn Bath Date of Birth/Sex: 03-23-1946 (71 y.o. 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 History of Present Illness (HPI) 01/04/17; this is a 71 year old diabetic man who has a remote history of a traumatic lower extremity damage at age 13 requiring an amputation. He tells me spent 2 years walking on crutches then ultimately has been walking on prosthesis without any trouble since then. Several months ago he had a new prosthesis and developed an open area in the right stump in December. He saw podiatry Dr. Prudence Davidson who noted that this was really out of his practice jurisdiction and referred him here. He has been using mupirocin. He has continued to walk on the prosthesis. The prosthesis itself as been adjusted by the prostatitis. He does not have a known arterial issue. He tells me he probably has diabetic neuropathy. He had a wound that took a long time to heal surrounding the actual amputation itself however is not had more recent wounds. 01/10/17 Culture from last week grew pseudomonas. change AB to cefdinir yesterday from doxy. he is not using his prosthesis which I emphasized although he is having it adjusted next week 01/17/17; he is completing his antibiotics in  the next day or 2. We've been using silver alginate. His prosthesis is away being readjusted. He had an x-ray of the underlying bone here apparently at his podiatrist's office although I'll need to have a look at that and we can't find the result then he may need this re-x-rayed. 01/24/17; he has completed his antibiotics. Change to Silver collagen last week. No real change in the wound this week. 01/31/17 repeat culture I did  last week showed a few Pseudomonas. This only has intermediate sensitivity to quinolones I therefore put him back on a week of Cefdinir. X-ray of the stump did not show any bone destruction. We are attempting to get vascular studies done through Dr.Arida's office on May 23. 02/07/17; arterial studies are booked for May 23 no major change. He has completed his antibiotics 02/14/17 arterial studies on Thursday 5/23. using collagen 02/21/17; the patient had his arterial studies. Both his ABIs were good at 1.0 on the right and 1.1 on the left. His abdominal and iliac arteries revealed atherosclerosis without focal stenosis duplex imaging of the right lower leg revealed heterogeneous plaque and biphasic Doppler waveforms from the right CFA to the popliteal artery. The right distal SFA and tibial peroneal trunk velocities were elevated. 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 The proximal right ATA and PTA were patent above the amputation level. We have been using Silver collagen with some improvement in the wound dimensions 02/28/17; we went ahead and did TCOM measurements on this patient. One of the leads was nonfunctional however on the right leg both the proximal and periwound leads registered greater than 70 mmHg oxygen Schuchard, Kacin T. ([id number]) tension. Interestingly the comparison lead on the left leg/relative wound level was in the mid 40s. This allays any fears I had about contributing wound ischemia. In the meantime is measurements today were 0.2 x 0.4  x 0.7 the wound is now a small divot most of it appears to be epithelialized with only a small open area at that depth. There is no evidence of surrounding infection Objective Constitutional Sitting or standing Blood Pressure is within target range for patient.. Pulse regular and within target range for patient.Marland Kitchen Respirations regular, non-labored and within target range.. Temperature is normal and within the target range for the patient.Marland Kitchen appears in no distress. Vitals Time Taken: 9:20 AM, Height: 70 in, Weight: 266.5 lbs, BMI: 38.2, Temperature: 97.6 F, Pulse: 72 bpm, Respiratory Rate: 16 breaths/min, Blood Pressure: 138/60 mmHg. Eyes Conjunctivae clear. No discharge. Respiratory Respiratory effort is easy and symmetric bilaterally. Rate is normal at rest and on room air.. Cardiovascular Palpable femoral pulse. Still no palpable popliteal pulse on the right. Lymphatic None palpable in the popliteal or inguinal area. Psychiatric No evidence of depression, anxiety, or agitation. Calm, cooperative, and communicative. Appropriate interactions and affect.. General Notes: Wound exam; smaller but deep wound. It appears that there is only a small open area at the depth of this "[facility] shaped" wound. There is no evidence of surrounding infection Integumentary (Hair, Skin) Wound #1 status is Open. Original cause of wound was Gradually Appeared. 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 The wound measures 0.2cm length x 0.4cm width x 0.7cm depth; 0.063cm^2 area and 0.044cm^3 volume. There is no tunneling or undermining noted. There is a medium amount of serous drainage noted. The wound margin is distinct with the outline attached to the wound base. There is large (67-100%) pink, pale granulation within the wound bed. There is a small (1-33%) amount of necrotic tissue within the wound bed including Adherent Slough. The periwound skin appearance exhibited: ABDIAZIZ, KLAHN  T. ([id number]) Callus, Scarring. The periwound skin appearance did not exhibit: Crepitus, Excoriation, Induration, Rash, Dry/Scaly, Maceration, Atrophie Blanche, Cyanosis, Ecchymosis, Hemosiderin Staining, Mottled, Pallor, Rubor, Erythema. Periwound temperature was noted as No Abnormality. The periwound has tenderness on palpation. 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 Cleanse wound with mild soap and water Anesthetic: Wound #1 Right Amputation Site - Below Knee: Topical Lidocaine 4% cream applied to wound bed prior to debridement - in clinic only Skin Barriers/Peri-Wound Care: Wound #1 Right Amputation Site - Below Knee: Skin Prep Primary Wound Dressing: Wound #1 Right Amputation Site - Below Knee: Prisma Ag Secondary Dressing: Wound #1 Right Amputation Site - Below Knee: Boardered Foam Dressing Dressing Change Frequency: Wound #1 Right Amputation Site - Below Knee: Change dressing every other day. Follow-up Appointments: Wound #1 Right Amputation Site - Below Knee: Return Appointment in 1 week. Edema Control: Wound #1 Right Amputation Site - Below Knee: JALEN, DALUZ ([id number]) Elevate legs to the level of the heart and pump ankles as often as possible Off-Loading: Other: - do not wear your prosthesis Additional Orders / Instructions: Wound #1 Right Amputation Site - Below Knee: Increase protein intake. Medications-please add to medication list.: Wound #1 Right Amputation Site - Below Knee: Other: - vitamin C, zinc, MVI General Notes: TcPO2, normobaric performed today in clinic. #1  I'm going to continue with Silver Collegen which she is changing every second day, border foam #2 his prosthetic leg on the right is currently being altered in [place] I have asked him to get this as I think we may be done in 2 weeks. #3 the resting TCOM values breathing room air were well over 70 and the periwound allaying any fear I had about ongoing ischemia contributing to this wounds difficulty healing or the pathogenesis of any further wounds once we try to put him in his prosthesis Electronic Signature(s) Signed: 02/28/2017 11:42:58 AM By: Gretta Cool, BSN, RN, CWS, Kim RN, BSN Signed: 02/28/2017 5:27:39 PM By: Linton Ham MD Entered By: Gretta Cool, BSN, RN, CWS, Kim on 02/28/2017 11:42:57 [name] ([id number]) -------------------------------------------------------------------------------- Meta Hatchet Date of Service: 02/28/2017 9:15 AM Patient Name: T. Patient Account Number: [id number] Medical Record Treating RN: Cornell Barman [id number] Number: Other Clinician: Jacqulyn Bath Date of Birth/Sex: 1946-01-13 (71 y.o. Male) Treating ROBSON, [place] Primary Care Provider: [name], Delfino Lovett Provider/Extender: G Referring Provider: [name], [name] Weeks in Treatment: 7 Diagnosis Diagnosis: Atherosclerosis of extremities with ulceration Code: 440.23 TCOM Site Locations Site Locations TCOM Site Information Reference Site Pulse Oximetry: 97 Site #1 Location Description: Sensor not woking Regional Perfusion Index: Baseline @2ATA : Measurement: Extremity Oxygen Oxygen Final Time: 15 min Elevated Challenge Challenge 15 min 30 min 45 min 60 min 75 min 90 min Reading 15 min 5 min 10 min mmHg: 0 - - - - - - - - - - Site #2 Location Description: Right distal lower leg Regional Perfusion Index: CLEVON, KHADER ([id number]) Baseline @2ATA : Measurement: Extremity Oxygen Oxygen Final Time: 15 min Elevated Challenge Challenge 15 min 30 min 45 min 60  min 75 min 90 min Reading 15 min 5 min 10 min mmHg: 74.3 - - - - - - - - - - Site #3 Location Description: Right proximal lower leg Regional Perfusion Index: Baseline @2ATA : Measurement: Extremity Oxygen Oxygen Final Time: 15 min Elevated Challenge Challenge 15 min 30 min 45 min 60 min 75 min 90 min Reading 15 min 5 min 10 min mmHg: 76.5 - - - - - - - - - -  Site #4 Location Description: Left distal lower leg Regional Perfusion Index: Baseline @2ATA : Measurement: Extremity Oxygen Oxygen Final Time: 15 min Elevated Challenge Challenge 15 min 30 min 45 min 60 min 75 min 90 min Reading 15 min 5 min 10 min mmHg: 45.9 - - - - - - - - - - Air Breaks Given: No Notes Right foot amputation at ankle. Electronic Signature(s) Signed: 02/28/2017 5:27:39 PM By: Linton Ham MD Signed: 03/01/2017 10:12:42 AM By: Gretta Cool, BSN, RN, CWS, Kim RN, BSN Entered By: Gretta Cool, BSN, RN, CWS, Kim on 02/28/2017 10:12:56 [name] ([id number]) -------------------------------------------------------------------------------- SuperBill Details [name] Date of Service: 02/28/2017 Patient Name: T. Patient Account Number: [id number] Medical Record Treating RN: Cornell Barman [id number] Number: Other Clinician: Jacqulyn Bath Date of Birth/Sex: [birth year] (71 y.o. 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, right lower leg, S88.111D subsequent encounter L03.115 Cellulitis of right lower limb Facility Procedures CPT4 Code: [id number] Description: [id number] - WOUND CARE VISIT-LEV 2 EST PT Modifier: Quantity: 1 Physician Procedures CPT4 Code  Description: [id number] - WC PHYS LEVEL 3 - EST PT ICD-10 Description Diagnosis E11.622 Type 2 diabetes mellitus with other skin ulcer L97.313 Non-pressure chronic ulcer of right ankle with necr Modifier: osis of musc Quantity: 1 le Electronic Signature(s) Signed: 02/28/2017 11:15:17 AM By: Gretta Cool, BSN, RN, CWS, Kim RN, BSN Signed: 02/28/2017 5:27:39 PM By: Linton Ham MD Previous Signature: 02/28/2017 10:45:13 AM Version By: Gretta Cool, BSN, RN, CWS, Kim RN, BSN Entered By: Gretta Cool, BSN, RN, CWS, Kim on 02/28/2017 11:15:16

## 2017-03-07 ENCOUNTER — Encounter: Payer: PPO | Admitting: Internal Medicine

## 2017-03-07 DIAGNOSIS — S81801A Unspecified open wound, right lower leg, initial encounter: Secondary | ICD-10-CM | POA: Diagnosis not present

## 2017-03-07 DIAGNOSIS — E11622 Type 2 diabetes mellitus with other skin ulcer: Secondary | ICD-10-CM | POA: Diagnosis not present

## 2017-03-08 NOTE — Progress Notes (Signed)
Andrew Lyons, Andrew Lyons ([id number]) Visit Report for 03/07/2017 Arrival Information Details Patient Name: Andrew Lyons, Andrew Lyons Date of Service: 03/07/2017 8:45 AM Medical Record Number: [id number] Patient Account Number: [id number] Date of Birth/Sex: 1946/02/08 (71 y.o. Male) Treating RN: [name], [name] Primary Care Nikkol Pai: [name], Delfino Lovett Other Clinician: Referring Davieon Stockham: [name], [name] Treating Anaira Seay/Extender: [name] in Treatment: 8 Visit Information History Since Last Visit All ordered tests and consults were completed: No Patient Arrived: Other Added or deleted any medications: No Arrival Time: 08:58 Any new allergies or adverse reactions: No Accompanied By: self Had a fall or experienced change in No Transfer Assistance: EasyPivot activities of daily living that may affect Patient Lift risk of falls: Patient Identification Verified: Yes Signs or symptoms of abuse/neglect since last No Secondary Verification Process Yes visito Completed: Hospitalized since last visit: No Patient Requires Transmission- No Has Dressing in Place as Prescribed: Yes Based Precautions: Pain Present Now: No Patient Has Alerts: Yes Patient Alerts: DM II Electronic Signature(s) Signed: 03/07/2017 5:04:17 PM By: Alric Quan Entered By: Alric Quan on 03/07/2017 08:59:19 Andrew Lyons ([id number]) -------------------------------------------------------------------------------- Clinic Level of Care Assessment Details Patient Name: Andrew Lyons Date of Service: 03/07/2017 8:45 AM Medical Record Number: [id number] Patient Account Number: [id number] Date of Birth/Sex: [birth year] (71 y.o. Male) Treating RN: [name], [name] Primary Care Ismail Graziani: [name], Delfino Lovett Other Clinician: Referring Ridley Schewe: [name], [name] Treating Gracin Soohoo/Extender: [name] in Treatment: 8 Clinic Level of Care Assessment Items TOOL 4 Quantity  Score X - Use when only an EandM is performed on FOLLOW-UP visit 1 0 ASSESSMENTS - Nursing Assessment / Reassessment X - Reassessment of Co-morbidities (includes updates in patient status) 1 10 X - Reassessment of Adherence to Treatment Plan 1 5 ASSESSMENTS - Wound and Skin Assessment / Reassessment X - Simple Wound Assessment / Reassessment - one wound 1 5 []  - Complex Wound Assessment / Reassessment - multiple wounds 0 []  - Dermatologic / Skin Assessment (not related to wound area) 0 ASSESSMENTS - Focused Assessment []  - Circumferential Edema Measurements - multi extremities 0 []  - Nutritional Assessment / Counseling / Intervention 0 []  - Lower Extremity Assessment (monofilament, tuning fork, pulses) 0 []  - Peripheral Arterial Disease Assessment (using hand held doppler) 0 ASSESSMENTS - Ostomy and/or Continence Assessment and Care []  - Incontinence Assessment and Management 0 []  - Ostomy Care Assessment and Management (repouching, etc.) 0 PROCESS - Coordination of Care X - Simple Patient / Family Education for ongoing care 1 15 []  - Complex (extensive) Patient / Family Education for ongoing care 0 []  - Staff obtains Programmer, systems, Records, Test Results / Process Orders 0 []  - Staff telephones HHA, Nursing Homes / Clarify orders / etc 0 []  - Routine Transfer to another Facility (non-emergent condition) 0 Andrew Lyons, Andrew Lyons ([id number]) []  - Routine Hospital Admission (non-emergent condition) 0 []  - New Admissions / Biomedical engineer / Ordering NPWT, Apligraf, etc. 0 []  - Emergency Hospital Admission (emergent condition) 0 X - Simple Discharge Coordination 1 10 []  - Complex (extensive) Discharge Coordination 0 PROCESS - Special Needs []  - Pediatric / Minor Patient Management 0 []  - Isolation Patient Management 0 []  - Hearing / Language / Visual special needs 0 []  - Assessment of Community assistance (transportation, D/C planning, etc.) 0 []  - Additional assistance / Altered  mentation 0 []  - Support Surface(s) Assessment (bed, cushion, seat, etc.) 0 INTERVENTIONS - Wound Cleansing / Measurement X - Simple Wound Cleansing - one wound 1 5 []  -  Complex Wound Cleansing - multiple wounds 0 X - Wound Imaging (photographs - any number of wounds) 1 5 []  - Wound Tracing (instead of photographs) 0 X - Simple Wound Measurement - one wound 1 5 []  - Complex Wound Measurement - multiple wounds 0 INTERVENTIONS - Wound Dressings X - Small Wound Dressing one or multiple wounds 1 10 []  - Medium Wound Dressing one or multiple wounds 0 []  - Large Wound Dressing one or multiple wounds 0 X - Application of Medications - topical 1 5 []  - Application of Medications - injection 0 INTERVENTIONS - Miscellaneous []  - External ear exam 0 Andrew Lyons, Andrew Lyons. ([id number]) []  - Specimen Collection (cultures, biopsies, blood, body fluids, etc.) 0 []  - Specimen(s) / Culture(s) sent or taken to Lab for analysis 0 []  - Patient Transfer (multiple staff / Harrel Lemon Lift / Similar devices) 0 []  - Simple Staple / Suture removal (25 or less) 0 []  - Complex Staple / Suture removal (26 or more) 0 []  - Hypo / Hyperglycemic Management (close monitor of Blood Glucose) 0 []  - Ankle / Brachial Index (ABI) - do not check if billed separately 0 X - Vital Signs 1 5 Has the patient been seen at the hospital within the last three years: Yes Total Score: 80 Level Of Care: New/Established - Level 3 Electronic Signature(s) Signed: 03/07/2017 5:04:17 PM By: Alric Quan Entered By: Alric Quan on 03/07/2017 09:29:39 Andrew Lyons ([id number]) -------------------------------------------------------------------------------- Encounter Discharge Information Details Patient Name: Andrew Lyons Date of Service: 03/07/2017 8:45 AM Medical Record Number: [id number] Patient Account Number: [id number] Date of Birth/Sex: [birth year] (71 y.o. Male) Treating RN: [name], [name] Primary Care Cosimo Schertzer:  [name], Delfino Lovett Other Clinician: Referring Brynja Marker: [name], [name] Treating Miracle Mongillo/Extender: [name] in Treatment: 8 Encounter Discharge Information Items Discharge Pain Level: 0 Discharge Condition: Stable Ambulatory Status: Walker Discharge Destination: Home Transportation: Private Auto Accompanied By: self Schedule Follow-up Appointment: Yes Medication Reconciliation completed No and provided to Patient/Care Havanna Groner: Provided on Clinical Summary of Care: 03/07/2017 Form Type Recipient Paper Patient KA Electronic Signature(s) Signed: 03/07/2017 9:25:51 AM By: Ruthine Dose Entered By: Ruthine Dose on 03/07/2017 09:25:50 Andrew Lyons ([id number]) -------------------------------------------------------------------------------- Lower Extremity Assessment Details Patient Name: Andrew Lyons Date of Service: 03/07/2017 8:45 AM Medical Record Number: [id number] Patient Account Number: [id number] Date of Birth/Sex: [birth year] (71 y.o. Male) Treating RN: [name], [name] Primary Care Blaiden Werth: [name], Delfino Lovett Other Clinician: Referring Hildegarde Dunaway: [name], [name] Treating Oluwademilade Kellett/Extender: [name] in Treatment: 8 Vascular Assessment Pulses: Dorsalis Pedis Palpable: [Right:Yes] Posterior Tibial Extremity colors, hair growth, and conditions: Extremity Color: [Right:Normal] Temperature of Extremity: [Right:Warm] Electronic Signature(s) Signed: 03/07/2017 5:04:17 PM By: Alric Quan Entered By: Alric Quan on 03/07/2017 09:13:07 Andrew Lyons ([id number]) -------------------------------------------------------------------------------- Multi Wound Chart Details Patient Name: Andrew Lyons Date of Service: 03/07/2017 8:45 AM Medical Record Number: [id number] Patient Account Number: [id number] Date of Birth/Sex: [birth year] (71 y.o. Male) Treating RN: [name], [name] Primary Care Khalila Buechner: [name], Delfino Lovett Other Clinician: Referring Nakul Avino: [name], [name] Treating Zubayr Bednarczyk/Extender: [name] in Treatment: 8 Vital Signs Height(in): 70 Pulse(bpm): 56 Weight(lbs): 266.5 Blood Pressure 130/70 (mmHg): Body Mass Index(BMI): 38 Temperature(F): 97.8 Respiratory Rate 18 (breaths/min): Photos: [1:No Photos] [N/A:N/A] Wound Location: [1:Right Amputation Site - Below Knee] [N/A:N/A] Wounding Event: [1:Gradually Appeared] [N/A:N/A] Primary Etiology: [1:Atypical] [N/A:N/A] Comorbid History: [1:Cataracts, Chronic Obstructive Pulmonary Disease (COPD), Sleep Apnea, Hypotension, Myocardial Infarction, Type II Diabetes, Osteoarthritis, Neuropathy] [N/A:N/A] Date Acquired: [1:08/26/2016] [N/A:N/A] Weeks of  Treatment: [1:8] [N/A:N/A] Wound Status: [1:Open] [N/A:N/A] Measurements L x W x D 0.2x0.2x0.3 [N/A:N/A] (cm) Area (cm) : [1:0.031] [N/A:N/A] Volume (cm) : [1:0.009] [N/A:N/A] % Reduction in Area: [1:60.80%] [N/A:N/A] % Reduction in Volume: 87.30% [N/A:N/A] Classification: [1:Full Thickness With Exposed Support Structures] [N/A:N/A] HBO Classification: [1:Grade 1] [N/A:N/A] Exudate Amount: [1:Large] [N/A:N/A] Exudate Type: [1:Serous] [N/A:N/A] Exudate Color: [1:amber] [N/A:N/A] Wound Margin: [1:Distinct, outline attached N/A] Granulation Amount: [1:Large (67-100%)] [N/A:N/A] Granulation Quality: Pink, Pale N/A N/A Necrotic Amount: Small (1-33%) N/A N/A Exposed Structures: Fascia: No N/A N/A Fat Layer (Subcutaneous Tissue) Exposed: No Tendon: No Muscle: No Joint: No Bone: No Epithelialization: Medium (34-66%) N/A N/A Periwound Skin Texture: Callus: Yes N/A N/A Scarring: Yes Excoriation: No Induration: No Crepitus: No Rash: No Periwound Skin Maceration: No N/A N/A Moisture: Dry/Scaly: No Periwound Skin Color: Atrophie Blanche: No N/A N/A Cyanosis: No Ecchymosis: No Erythema: No Hemosiderin Staining: No Mottled: No Pallor: No Rubor:  No Temperature: No Abnormality N/A N/A Tenderness on Yes N/A N/A Palpation: Wound Preparation: Ulcer Cleansing: N/A N/A Rinsed/Irrigated with Saline Topical Anesthetic Applied: Other: lidocaine 4% Treatment Notes Wound #1 (Right Amputation Site - Below Knee) 1. Cleansed with: Clean wound with Normal Saline 2. Anesthetic Topical Lidocaine 4% cream to wound bed prior to debridement 3. Peri-wound Care: Skin Prep 4. Dressing Applied: Iodosorb Ointment 5. Secondary Dressing Applied Bordered Foam Dressing Andrew Lyons, Andrew Lyons ([id number]) Dry Gauze Electronic Signature(s) Signed: 03/07/2017 6:01:27 PM By: Linton Ham MD Entered By: Linton Ham on 03/07/2017 09:23:16 Andrew Lyons ([id number]) -------------------------------------------------------------------------------- Multi-Disciplinary Care Plan Details Patient Name: Andrew Lyons Date of Service: 03/07/2017 8:45 AM Medical Record Number: [id number] Patient Account Number: [id number] Date of Birth/Sex: 18-Jun-1946 (71 y.o. Male) Treating RN: [name], [name] Primary Care Vineeth Fell: [name], Delfino Lovett Other Clinician: Referring Lashara Urey: [name], [name] Treating Rox Mcgriff/Extender: [name] in Treatment: 8 Active Inactive ` Abuse / Safety / Falls / Self Care Management Nursing Diagnoses: Potential for falls Goals: Patient will remain injury free Date Initiated: 01/04/2017 Target Resolution Date: 04/01/2017 Goal Status: Active Interventions: Assess fall risk on admission and as needed Assess self care needs on admission and as needed Notes: ` Orientation to the Wound Care Program Nursing Diagnoses: Knowledge deficit related to the wound healing center program Goals: Patient/caregiver will verbalize understanding of the [place] Program Date Initiated: 01/04/2017 Target Resolution Date: 01/28/2017 Goal Status: Active Interventions: Provide education on orientation to  the wound center Notes: ` Wound/Skin Impairment Nursing Diagnoses: Impaired tissue integrity Andrew Lyons, Andrew Lyons ([id number]) Knowledge deficit related to ulceration/compromised skin integrity Goals: Ulcer/skin breakdown will have a volume reduction of 80% by week 12 Date Initiated: 01/04/2017 Target Resolution Date: 04/22/2017 Goal Status: Active Interventions: Assess patient/caregiver ability to perform ulcer/skin care regimen upon admission and as needed Assess ulceration(s) every visit Notes: Electronic Signature(s) Signed: 03/07/2017 5:04:17 PM By: Alric Quan Entered By: Alric Quan on 03/07/2017 09:13:12 Andrew Lyons ([id number]) -------------------------------------------------------------------------------- Pain Assessment Details Patient Name: Andrew Lyons Date of Service: 03/07/2017 8:45 AM Medical Record Number: [id number] Patient Account Number: [id number] Date of Birth/Sex: 1945-11-05 (71 y.o. Male) Treating RN: [name], [name] Primary Care Lonn Im: [name], Delfino Lovett Other Clinician: Referring Aladdin Kollmann: [name], [name] Treating Ronella Plunk/Extender: [name] in Treatment: 8 Active Problems Location of Pain Severity and Description of Pain Patient Has Paino No Site Locations With Dressing Change: No Pain Management and Medication Current Pain Management: Electronic Signature(s) Signed: 03/07/2017 5:04:17 PM By: Alric Quan Entered By: Alric Quan on 03/07/2017 08:59:25  Andrew Lyons, Andrew Lyons ([id number]) -------------------------------------------------------------------------------- Patient/Caregiver Education Details [name] Date of Service: 03/07/2017 8:45 AM Patient Name: T. Patient Account Number: [id number] Medical Record Treating RN: [name] [id number] Number: Other Clinician: Date of Birth/Gender: [birth year] (71 y.o. Male) Treating Linton Ham Primary Care Physician: [name] Physician/Extender: G Referring Physician: [name] in Treatment: 8 Education Assessment Education Provided To: Patient Education Topics Provided Wound/Skin Impairment: Handouts: Other: change dressing as ordered Methods: Demonstration, Explain/Verbal Responses: State content correctly Electronic Signature(s) Signed: 03/07/2017 5:04:17 PM By: Alric Quan Entered By: Alric Quan on 03/07/2017 09:09:22 Andrew Lyons ([id number]) -------------------------------------------------------------------------------- Wound Assessment Details Patient Name: Andrew Lyons Date of Service: 03/07/2017 8:45 AM Medical Record Number: [id number] Patient Account Number: [id number] Date of Birth/Sex: 09/01/1946 (71 y.o. Male) Treating RN: [name], [name] Primary Care Hadlea Furuya: [name], Delfino Lovett Other Clinician: Referring Raheen Capili: [name], [name] Treating Trelon Plush/Extender: [name] in Treatment: 8 Wound Status Wound Number: 1 Primary Atypical Etiology: Wound Location: Right Amputation Site - Below Knee Wound Open Status: Wounding Event: Gradually Appeared Comorbid Cataracts, Chronic Obstructive Date Acquired: 08/26/2016 History: Pulmonary Disease (COPD), Sleep Weeks Of Treatment: 8 Apnea, Hypotension, Myocardial Clustered Wound: No Infarction, Type II Diabetes, Osteoarthritis, Neuropathy Photos Photo Uploaded By: Alric Quan on 03/07/2017 16:18:27 Wound Measurements Length: (cm) 0.2 Width: (cm) 0.2 Depth: (cm) 0.3 Area: (cm) 0.031 Volume: (cm) 0.009 % Reduction in Area: 60.8% % Reduction in Volume: 87.3% Epithelialization: Medium (34-66%) Tunneling: No Wound Description Full Thickness With Exposed Classification: Support Structures Diabetic Severity Grade 1 (Wagner): Wound Margin: Distinct, outline attached Exudate Amount: Large Exudate Type: Serous Exudate Color: amber Andrew Lyons, Andrew Lyons  ([id number]) Foul Odor After Cleansing: No Slough/Fibrino Yes Wound Bed Granulation Amount: Large (67-100%) Exposed Structure Granulation Quality: Pink, Pale Fascia Exposed: No Necrotic Amount: Small (1-33%) Fat Layer (Subcutaneous Tissue) Exposed: No Necrotic Quality: Adherent Slough Tendon Exposed: No Muscle Exposed: No Joint Exposed: No Bone Exposed: No Periwound Skin Texture Texture Color No Abnormalities Noted: No No Abnormalities Noted: No Callus: Yes Atrophie Blanche: No Crepitus: No Cyanosis: No Excoriation: No Ecchymosis: No Induration: No Erythema: No Rash: No Hemosiderin Staining: No Scarring: Yes Mottled: No Pallor: No Moisture Rubor: No No Abnormalities Noted: No Dry / Scaly: No Temperature / Pain Maceration: No Temperature: No Abnormality Tenderness on Palpation: Yes Wound Preparation Ulcer Cleansing: Rinsed/Irrigated with Saline Topical Anesthetic Applied: Other: lidocaine 4%, Treatment Notes Wound #1 (Right Amputation Site - Below Knee) 1. Cleansed with: Clean wound with Normal Saline 2. Anesthetic Topical Lidocaine 4% cream to wound bed prior to debridement 3. Peri-wound Care: Skin Prep 4. Dressing Applied: Iodosorb Ointment 5. Secondary Dressing Applied Bordered Foam Dressing Dry Gauze Electronic Signature(s) Signed: 03/07/2017 5:04:17 PM By: Alric Quan Entered By: Alric Quan on 03/07/2017 09:05:53 Andrew Lyons, Andrew Lyons ([id number]) Andrew Lyons, Andrew Lyons ([id number]) -------------------------------------------------------------------------------- Andrew Lyons Details Patient Name: Andrew Lyons Date of Service: 03/07/2017 8:45 AM Medical Record Number: [id number] Patient Account Number: [id number] Date of Birth/Sex: 18-Sep-1946 (71 y.o. Male) Treating RN: [name], [name] Primary Care Laithan Conchas: [name], Delfino Lovett Other Clinician: Referring Saliou Barnier: [name], [name] Treating Fatiha Guzy/Extender: [name] in  Treatment: 8 Vital Signs Time Taken: 08:59 Temperature (F): 97.8 Height (in): 70 Pulse (bpm): 56 Weight (lbs): 266.5 Respiratory Rate (breaths/min): 18 Body Mass Index (BMI): 38.2 Blood Pressure (mmHg): 130/70 Reference Range: 80 - 120 mg / dl Electronic Signature(s) Signed: 03/07/2017 5:04:17 PM By: Alric Quan Entered By: Alric Quan on 03/07/2017 09:01:04

## 2017-03-08 NOTE — Progress Notes (Signed)
Andrew Lyons, Andrew Lyons ([id number]) Visit Report for 03/07/2017 Chief Complaint Document Details Andrew Lyons Date of Service: 03/07/2017 8:45 AM Patient Name: T. Patient Account Number: [id number] Medical Record Treating RN: Andrew Lyons [id number] Number: Other Clinician: Date of Birth/Sex: 1946/03/25 (71 y.o. 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 Electronic Signature(s) Signed: 03/07/2017 6:01:27 PM By: Andrew Ham MD Entered By: Andrew Lyons on 03/07/2017 09:23:35 Andrew Lyons ([id number]) -------------------------------------------------------------------------------- HPI Details Andrew Lyons Date of Service: 03/07/2017 8:45 AM Patient Name: T. Patient Account Number: [id number] Medical Record Treating RN: Andrew Lyons [id number] Number: Other Clinician: Date of Birth/Sex: 1946/01/30 (71 y.o. Male) Treating Andrew Lyons Primary Care Provider: Wilhemena Lyons Provider/Extender: G Referring Provider: Quintella Lyons in Treatment: 8 History of Present Illness HPI Description: 01/04/17; this is a 71 year old diabetic man who has a remote history of a traumatic lower extremity damage at age 43 requiring an amputation. He tells me spent 2 years walking on crutches then ultimately has been walking on prosthesis without any trouble since then. Several months ago he had a new prosthesis and developed an open area in the right stump in December. He saw podiatry Andrew Lyons who noted that this was really out of his practice jurisdiction and referred him here. He has been using mupirocin. He has continued to walk on the prosthesis. The prosthesis itself as been adjusted by  the prostatitis. He does not have a known arterial issue. He tells me he probably has diabetic neuropathy. He had a wound that took a long time to heal surrounding the actual amputation itself however is not had more recent wounds. 01/10/17 Culture from last week grew pseudomonas. change AB to cefdinir yesterday from doxy. he is not using his prosthesis which I emphasized although he is having it adjusted next week 01/17/17; he is completing his antibiotics in the next day or 2. We've been using silver alginate. His prosthesis is away being readjusted. He had an x-ray of the underlying bone here apparently at his podiatrist's office although I'll need to have a look at that and we can't find the result then he may need this re-x-rayed. 01/24/17; he has completed his antibiotics. Change to Silver collagen last week. No real change in the wound this week. 01/31/17 repeat culture I did last week showed a few Pseudomonas. This only has intermediate sensitivity to quinolones I therefore put him back on a week of Cefdinir. X-ray of the stump did not show any bone destruction. We are attempting to get vascular studies done through Andrew Lyons's office on May 23. 02/07/17; arterial studies are booked for May 23 no major change. He has completed his antibiotics 02/14/17 arterial studies on Thursday 5/23. using collagen 02/21/17; the patient had his arterial studies. Both his ABIs were good at 1.0 on the right and 1.1 on the left. His abdominal and iliac arteries revealed atherosclerosis without focal stenosis duplex imaging of the right lower leg revealed heterogeneous plaque and biphasic Doppler waveforms from the right CFA to the popliteal artery. The right distal SFA and tibial peroneal trunk velocities were elevated. 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 The proximal right ATA and PTA were patent above the amputation level. We have been using Silver collagen with some improvement in the  wound dimensions  02/28/17; we went ahead and did TCOM measurements on this patient. One of the leads was nonfunctional however on the right leg both the proximal and periwound leads registered greater than 70 mmHg oxygen tension. Interestingly the comparison lead on the left leg/relative wound level was in the mid 40s. This allays any fears I had about contributing wound ischemia. In the meantime is measurements today were 0.2 x 0.4 x 0.7 the wound is now a small divot most of it appears to be epithelialized with only a small open area at that depth. There is no evidence of surrounding Andrew Lyons, Andrew T. ([id number]) infection 03/07/17; continued improvement down to 0.3 cm in depth. This is a very tiny circular opening. No drainage no tenderness. Looks like it is been progressing towards closure. He has been using Silver collagen although I think this is too small to use this as a primary dressing Electronic Signature(s) Signed: 03/07/2017 6:01:27 PM By: Andrew Ham MD Entered By: Andrew Lyons on 03/07/2017 09:24:45 Andrew Lyons ([id number]) -------------------------------------------------------------------------------- Physical Exam Details Andrew Lyons Date of Service: 03/07/2017 8:45 AM Patient Name: T. Patient Account Number: [id number] Medical Record Treating RN: Andrew Lyons [id number] Number: Other Clinician: Date of Birth/Sex: [birth year] (71 y.o. Male) Treating Andrew Lyons Primary Care Provider: Wilhemena Lyons Provider/Extender: G Referring Provider: [name], [name] Weeks in Treatment: 8 Constitutional Sitting or standing Blood Pressure is within target range for patient.. Pulse regular and within target range for patient.Marland Kitchen Respirations regular, non-labored and within target range.. Temperature is normal and within the target range for the patient.Marland Kitchen appears in no distress. Eyes Conjunctivae clear. No discharge. Respiratory Respiratory effort is  easy and symmetric bilaterally. Rate is normal at rest and on room air.. Cardiovascular Popliteal pulse remains difficult to feel although TCOM last week were normal. Lymphatic Nonpalpable no popliteal or inguinal area. Notes Wound exam; 0.3 cm in depth down from 0.7 last time. No evidence of surrounding infection no debridement required Electronic Signature(s) Signed: 03/07/2017 6:01:27 PM By: Andrew Ham MD Entered By: Andrew Lyons on 03/07/2017 09:26:35 Andrew Lyons ([id number]) -------------------------------------------------------------------------------- Physician Orders Details Andrew Lyons Date of Service: 03/07/2017 8:45 AM Patient Name: T. Patient Account Number: [id number] Medical Record Treating RN: Andrew Lyons [id number] Number: Other Clinician: Date of Birth/Sex: 1945-12-09 (71 y.o. Male) Treating [name], [place] Primary Care Provider: Wilhemena Lyons Provider/Extender: G Referring Provider: Quintella Lyons in Treatment: 8 Verbal / Phone Orders: Yes Clinician: [name], [name] Read Back and Verified: Yes Diagnosis Coding Wound Cleansing Wound #1 Right Amputation Site - Below Knee o Clean wound with Normal Saline. o Cleanse wound with mild soap and water Anesthetic Wound #1 Right Amputation Site - Below Knee o Topical Lidocaine 4% cream applied to wound bed prior to debridement - in clinic only Skin Barriers/Peri-Wound Care Wound #1 Right Amputation Site - Below Knee o Skin Prep Primary Wound Dressing Wound #1 Right Amputation Site - Below Knee o Iodosorb Ointment Secondary Dressing Wound #1 Right Amputation Site - Below Knee o Dry Gauze o Boardered Foam Dressing Dressing Change Frequency Wound #1 Right Amputation Site - Below Knee o Change dressing every other day. Follow-up Appointments Wound #1 Right Amputation Site - Below Knee o Return Appointment in 1 week. Edema Control Wound #1 Right Amputation  Site - Below Knee o Elevate legs to the level of the heart and pump ankles as often as possible Andrew Lyons, Andrew T. ([id number]) Off-Loading o Other: - do not wear your prosthesis Additional Orders / Instructions Wound #  1 Right Amputation Site - Below Knee o Increase protein intake. Medications-please add to medication list. Wound #1 Right Amputation Site - Below Knee o Other: - vitamin C, zinc, MVI Electronic Signature(s) Signed: 03/07/2017 5:04:17 PM By: Alric Quan Signed: 03/07/2017 6:01:27 PM By: Andrew Ham MD Entered By: Alric Quan on 03/07/2017 09:16:58 Andrew Lyons ([id number]) -------------------------------------------------------------------------------- Problem List Details Andrew Lyons Date of Service: 03/07/2017 8:45 AM Patient Name: T. Patient Account Number: [id number] Medical Record Treating RN: Andrew Lyons [id number] Number: Other Clinician: Date of Birth/Sex: 12-May-1946 (71 y.o. 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 Patient Account Number:  [id number] Medical Record Treating RN: Andrew Lyons [id number] Number: Other Clinician: Date of Birth/Sex: 03-16-1946 (71 y.o. 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 History of Present Illness (HPI) 01/04/17; this is a 71 year old diabetic man who has a remote history of a traumatic lower extremity damage at age 75 requiring an amputation. He tells me spent 2 years walking on crutches then ultimately has been walking on prosthesis without any trouble since then. Several months ago he had a new prosthesis and developed an open area in the right stump in December. He saw podiatry Andrew Lyons who noted that this was really out of his practice jurisdiction and referred him here. He has been using mupirocin. He has continued to walk on the prosthesis. The prosthesis itself as been adjusted by the prostatitis. He does not have a known arterial issue. He tells me he probably has diabetic neuropathy. He had a wound that took a long time to heal surrounding the actual amputation itself however is not had more recent wounds. 01/10/17 Culture from last week grew pseudomonas. change AB to cefdinir yesterday from doxy. he is not using his prosthesis which I emphasized although he is having it adjusted next week 01/17/17; he is completing his antibiotics in the next day or 2. We've been using silver alginate. His prosthesis is away being readjusted. He had an x-ray of the underlying bone here apparently at his podiatrist's office although I'll need to have a look at that and we can't find the result then he may need this re-x-rayed. 01/24/17; he has completed his antibiotics. Change to Silver collagen last week. No real change in  the wound this week. 01/31/17 repeat culture I did last week showed a few Pseudomonas. This only has intermediate sensitivity to quinolones I therefore put him back on a week of Cefdinir. X-ray of the stump did not show any bone destruction. We are attempting to get vascular studies done through Andrew Lyons's office on May 23. 02/07/17; arterial studies are booked for May 23 no  major change. He has completed his antibiotics 02/14/17 arterial studies on Thursday 5/23. using collagen 02/21/17; the patient had his arterial studies. Both his ABIs were good at 1.0 on the right and 1.1 on the left. His abdominal and iliac arteries revealed atherosclerosis without focal stenosis duplex imaging of the right lower leg revealed heterogeneous plaque and biphasic Doppler waveforms from the right CFA to the popliteal artery. The right distal SFA and tibial peroneal trunk velocities were elevated. 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 The proximal right ATA and PTA were patent above the amputation level. We have been using Silver collagen with some improvement in the wound dimensions 02/28/17; we went ahead and did TCOM measurements on this patient. One of the leads was nonfunctional however on the right leg both the proximal and periwound leads registered greater than 70 mmHg oxygen Andrew Lyons, Andrew T. ([id number]) tension. Interestingly the comparison lead on the left leg/relative wound level was in the mid 40s. This allays any fears I had about contributing wound ischemia. In the meantime is measurements today were 0.2 x 0.4 x 0.7 the wound is now a small divot most of it appears to be epithelialized with only a small open area at that depth. There is no evidence of surrounding infection 03/07/17; continued improvement down to 0.3 cm in depth. This is a very tiny circular opening. No drainage no tenderness. Looks like it is been progressing towards closure. He has been using Silver  collagen although I think this is too small to use this as a primary dressing Objective Constitutional Sitting or standing Blood Pressure is within target range for patient.. Pulse regular and within target range for patient.Marland Kitchen Respirations regular, non-labored and within target range.. Temperature is normal and within the target range for the patient.Marland Kitchen appears in no distress. Vitals Time Taken: 8:59 AM, Height: 70 in, Weight: 266.5 lbs, BMI: 38.2, Temperature: 97.8 F, Pulse: 56 bpm, Respiratory Rate: 18 breaths/min, Blood Pressure: 130/70 mmHg. Eyes Conjunctivae clear. No discharge. Respiratory Respiratory effort is easy and symmetric bilaterally. Rate is normal at rest and on room air.. Cardiovascular Popliteal pulse remains difficult to feel although TCOM last week were normal. Lymphatic Nonpalpable no popliteal or inguinal area. General Notes: Wound exam; 0.3 cm in depth down from 0.7 last time. No evidence of surrounding infection no debridement required Integumentary (Hair, Skin) Wound #1 status is Open. Original cause of wound was Gradually Appeared. 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 The wound measures 0.2cm length x 0.2cm width x 0.3cm depth; 0.031cm^2 area and 0.009cm^3 volume. There is no tunneling noted. There is a large amount of serous drainage noted. The wound margin is distinct with the outline attached to the wound base. There is large (67-100%) pink, pale granulation within the wound bed. There is a small (1-33%) amount of necrotic tissue within the wound bed including Adherent Slough. The periwound skin appearance exhibited: Callus, Scarring. The periwound skin appearance did not exhibit: Crepitus, Excoriation, Induration, Rash, Andrew Lyons, Andrew T. ([id number]) Dry/Scaly, Maceration, Atrophie Blanche, Cyanosis, Ecchymosis, Hemosiderin Staining, Mottled, Pallor, Rubor, Erythema. Periwound temperature was noted as No Abnormality. The  periwound has tenderness on palpation. 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 Cleanse wound with mild soap and water Anesthetic: Wound #1 Right Amputation Site - Below Knee: Topical Lidocaine 4% cream applied to wound bed prior to debridement - in clinic only Skin Barriers/Peri-Wound Care: Wound #1 Right Amputation Site - Below Knee: Skin Prep Primary Wound Dressing: Wound #1 Right Amputation Site - Below Knee: Iodosorb Ointment Secondary Dressing: Wound #1 Right Amputation Site - Below Knee: Dry Gauze Boardered Foam Dressing Dressing Change Frequency: Wound #1 Right Amputation Site - Below Knee: Change dressing every other day. Follow-up Appointments: Wound #1 Right Amputation Site - Below Knee: Return Appointment in 1 week. Edema Control: Wound #1 Right Amputation Site - Below Knee: Andrew Lyons, Andrew Lyons ([id number]) Elevate legs to the level of the heart and pump ankles as often as possible Off-Loading: Other: - do not wear your prosthesis Additional Orders / Instructions: Wound #1 Right Amputation Site - Below Knee: Increase protein intake. Medications-please add to medication list.: Wound #1 Right Amputation Site - Below Knee: Other: - vitamin C, zinc, MVI #1 I think this patient is progressing towards closure #2 his prosthesis is apparently ready to his prosthetist in [place] #3 changed his primary dressing to Iodosorb ointment with border foam hopefully will promote closure Electronic Signature(s) Signed: 03/07/2017 6:01:27 PM By: Andrew Ham MD Entered By: Andrew Lyons on  03/07/2017 09:27:29 Andrew Lyons ([id number]) -------------------------------------------------------------------------------- SuperBill Details Andrew Lyons Date of Service: 03/07/2017 Patient Name: T. Patient Account Number: [id number] Medical Record Treating RN: Andrew Lyons [id number] Number: Other Clinician: Date of Birth/Sex: 1946-04-18 (71 y.o. 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, right lower leg, S88.111D subsequent encounter L03.115 Cellulitis of right lower limb Facility Procedures CPT4 Code: [id number] Description: 99213 - WOUND CARE VISIT-LEV 3 EST PT Modifier: Quantity: 1 Physician Procedures CPT4 Code Description: [id number] - WC PHYS LEVEL 3 - EST PT ICD-10 Description Diagnosis E11.622 Type 2 diabetes mellitus with other skin ulcer L97.313 Non-pressure chronic ulcer of right ankle with necr Modifier: osis of muscl Quantity: 1 e Electronic Signature(s) Signed: 03/07/2017 5:04:17 PM By: Alric Quan Signed: 03/07/2017 6:01:27 PM By: Andrew Ham MD Entered By: Alric Quan on 03/07/2017 09:29:48

## 2017-03-14 ENCOUNTER — Encounter: Payer: PPO | Admitting: Internal Medicine

## 2017-03-14 DIAGNOSIS — T8189XA Other complications of procedures, not elsewhere classified, initial encounter: Secondary | ICD-10-CM | POA: Diagnosis not present

## 2017-03-14 DIAGNOSIS — E11622 Type 2 diabetes mellitus with other skin ulcer: Secondary | ICD-10-CM | POA: Diagnosis not present

## 2017-03-14 DIAGNOSIS — S81801A Unspecified open wound, right lower leg, initial encounter: Secondary | ICD-10-CM | POA: Diagnosis not present

## 2017-03-15 NOTE — Progress Notes (Signed)
JAQUAVIAN, FIRKUS ([id number]) Visit Report for 03/14/2017 Arrival Information Details Patient Name: Andrew Lyons, Andrew Lyons Date of Service: 03/14/2017 8:45 AM Medical Record Number: [id number] Patient Account Number: [id number] Date of Birth/Sex: 1945-12-20 (71 y.o. Male) Treating RN: [name], [name] Primary Care Fabricio Endsley: [name], Delfino Lovett Other Clinician: Referring Carmalita Wakefield: [name], [name] Treating Kaeden Depaz/Extender: [name] in Treatment: 9 Visit Information History Since Last Visit All ordered tests and consults were completed: No Patient Arrived: Other Added or deleted any medications: No Arrival Time: 08:43 Any new allergies or adverse reactions: No Accompanied By: self Had a fall or experienced change in No Transfer Assistance: EasyPivot activities of daily living that may affect Patient Lift risk of falls: Patient Identification Verified: Yes Signs or symptoms of abuse/neglect since last No Secondary Verification Process Yes visito Completed: Hospitalized since last visit: No Patient Requires Transmission- No Has Dressing in Place as Prescribed: Yes Based Precautions: Pain Present Now: No Patient Has Alerts: Yes Patient Alerts: DM II Electronic Signature(s) Signed: 03/14/2017 5:11:11 PM By: Alric Quan Entered By: Alric Quan on 03/14/2017 08:48:14 Andrew Lyons ([id number]) -------------------------------------------------------------------------------- Clinic Level of Care Assessment Details Patient Name: Andrew Lyons Date of Service: 03/14/2017 8:45 AM Medical Record Number: [id number] Patient Account Number: [id number] Date of Birth/Sex: 22-Dec-1945 (71 y.o. Male) Treating RN: [name], [name] Primary Care Zaidyn Claire: [name], Delfino Lovett Other Clinician: Referring Ibraheem Voris: [name], [name] Treating Avin Gibbons/Extender: [name] in Treatment: 9 Clinic Level of Care Assessment Items TOOL 4 Quantity  Score X - Use when only an EandM is performed on FOLLOW-UP visit 1 0 ASSESSMENTS - Nursing Assessment / Reassessment X - Reassessment of Co-morbidities (includes updates in patient status) 1 10 X - Reassessment of Adherence to Treatment Plan 1 5 ASSESSMENTS - Wound and Skin Assessment / Reassessment X - Simple Wound Assessment / Reassessment - one wound 1 5 []  - Complex Wound Assessment / Reassessment - multiple wounds 0 []  - Dermatologic / Skin Assessment (not related to wound area) 0 ASSESSMENTS - Focused Assessment []  - Circumferential Edema Measurements - multi extremities 0 []  - Nutritional Assessment / Counseling / Intervention 0 []  - Lower Extremity Assessment (monofilament, tuning fork, pulses) 0 []  - Peripheral Arterial Disease Assessment (using hand held doppler) 0 ASSESSMENTS - Ostomy and/or Continence Assessment and Care []  - Incontinence Assessment and Management 0 []  - Ostomy Care Assessment and Management (repouching, etc.) 0 PROCESS - Coordination of Care X - Simple Patient / Family Education for ongoing care 1 15 []  - Complex (extensive) Patient / Family Education for ongoing care 0 []  - Staff obtains Programmer, systems, Records, Test Results / Process Orders 0 []  - Staff telephones HHA, Nursing Homes / Clarify orders / etc 0 []  - Routine Transfer to another Facility (non-emergent condition) 0 ASHRAF, MESTA ([id number]) []  - Routine Hospital Admission (non-emergent condition) 0 []  - New Admissions / Biomedical engineer / Ordering NPWT, Apligraf, etc. 0 []  - Emergency Hospital Admission (emergent condition) 0 X - Simple Discharge Coordination 1 10 []  - Complex (extensive) Discharge Coordination 0 PROCESS - Special Needs []  - Pediatric / Minor Patient Management 0 []  - Isolation Patient Management 0 []  - Hearing / Language / Visual special needs 0 []  - Assessment of Community assistance (transportation, D/C planning, etc.) 0 []  - Additional assistance / Altered  mentation 0 []  - Support Surface(s) Assessment (bed, cushion, seat, etc.) 0 INTERVENTIONS - Wound Cleansing / Measurement X - Simple Wound Cleansing - one wound 1 5 []  -  Complex Wound Cleansing - multiple wounds 0 X - Wound Imaging (photographs - any number of wounds) 1 5 []  - Wound Tracing (instead of photographs) 0 X - Simple Wound Measurement - one wound 1 5 []  - Complex Wound Measurement - multiple wounds 0 INTERVENTIONS - Wound Dressings X - Small Wound Dressing one or multiple wounds 1 10 []  - Medium Wound Dressing one or multiple wounds 0 []  - Large Wound Dressing one or multiple wounds 0 X - Application of Medications - topical 1 5 []  - Application of Medications - injection 0 INTERVENTIONS - Miscellaneous []  - External ear exam 0 BRECKON, REEVES. ([id number]) []  - Specimen Collection (cultures, biopsies, blood, body fluids, etc.) 0 []  - Specimen(s) / Culture(s) sent or taken to Lab for analysis 0 []  - Patient Transfer (multiple staff / Harrel Lemon Lift / Similar devices) 0 []  - Simple Staple / Suture removal (25 or less) 0 []  - Complex Staple / Suture removal (26 or more) 0 []  - Hypo / Hyperglycemic Management (close monitor of Blood Glucose) 0 []  - Ankle / Brachial Index (ABI) - do not check if billed separately 0 X - Vital Signs 1 5 Has the patient been seen at the hospital within the last three years: Yes Total Score: 80 Level Of Care: New/Established - Level 3 Electronic Signature(s) Signed: 03/14/2017 5:11:11 PM By: Alric Quan Entered By: Alric Quan on 03/14/2017 12:34:43 Andrew Lyons ([id number]) -------------------------------------------------------------------------------- Encounter Discharge Information Details Patient Name: Andrew Lyons Date of Service: 03/14/2017 8:45 AM Medical Record Number: [id number] Patient Account Number: [id number] Date of Birth/Sex: [birth year] (71 y.o. Male) Treating RN: [name], [name] Primary Care Chamaine Stankus:  [name], Delfino Lovett Other Clinician: Referring Ambrosio Reuter: [name], [name] Treating Zhania Shaheen/Extender: [name] in Treatment: 9 Encounter Discharge Information Items Discharge Pain Level: 0 Discharge Condition: Stable Ambulatory Status: Walker Discharge Destination: Home Transportation: Private Auto Accompanied By: self Schedule Follow-up Appointment: Yes Medication Reconciliation completed No and provided to Patient/Care Dorette Hartel: Provided on Clinical Summary of Care: 03/14/2017 Form Type Recipient Paper Patient KA Electronic Signature(s) Signed: 03/14/2017 9:27:55 AM By: Ruthine Dose Entered By: Ruthine Dose on 03/14/2017 09:27:55 Andrew Lyons ([id number]) -------------------------------------------------------------------------------- Lower Extremity Assessment Details Patient Name: Andrew Lyons Date of Service: 03/14/2017 8:45 AM Medical Record Number: [id number] Patient Account Number: [id number] Date of Birth/Sex: August 21, 1946 (71 y.o. Male) Treating RN: [name], [name] Primary Care Kenslei Hearty: [name], Delfino Lovett Other Clinician: Referring Noemi Ishmael: [name], [name] Treating Belle Charlie/Extender: [name] in Treatment: 9 Vascular Assessment Pulses: Posterior Tibial Popliteal Palpable: [Right:Yes] Extremity colors, hair growth, and conditions: Extremity Color: [Right:Normal] Temperature of Extremity: [Right:Warm] Capillary Refill: [Right:< 3 seconds] Electronic Signature(s) Signed: 03/14/2017 5:11:11 PM By: Alric Quan Entered By: Alric Quan on 03/14/2017 08:56:51 Andrew Lyons ([id number]) -------------------------------------------------------------------------------- Multi Wound Chart Details Patient Name: Andrew Lyons Date of Service: 03/14/2017 8:45 AM Medical Record Number: [id number] Patient Account Number: [id number] Date of Birth/Sex: 06-23-1946 (71 y.o. Male) Treating RN: [name],  [name] Primary Care Partick Musselman: [name], Delfino Lovett Other Clinician: Referring Huston Stonehocker: [name], [name] Treating Jospeh Mangel/Extender: [name] in Treatment: 9 Vital Signs Height(in): 70 Pulse(bpm): 60 Weight(lbs): 266.5 Blood Pressure 120/57 (mmHg): Body Mass Index(BMI): 38 Temperature(F): 98.0 Respiratory Rate 18 (breaths/min): Photos: [1:No Photos] [N/A:N/A] Wound Location: [1:Right Amputation Site - Below Knee] [N/A:N/A] Wounding Event: [1:Gradually Appeared] [N/A:N/A] Primary Etiology: [1:Atypical] [N/A:N/A] Comorbid History: [1:Cataracts, Chronic Obstructive Pulmonary Disease (COPD), Sleep Apnea, Hypotension, Myocardial Infarction, Type II Diabetes, Osteoarthritis, Neuropathy] [N/A:N/A] Date Acquired: [  1:08/26/2016] [N/A:N/A] Weeks of Treatment: [1:9] [N/A:N/A] Wound Status: [1:Open] [N/A:N/A] Measurements L x W x D 0.2x0.2x0.3 [N/A:N/A] (cm) Area (cm) : [1:0.031] [N/A:N/A] Volume (cm) : [1:0.009] [N/A:N/A] % Reduction in Area: [1:60.80%] [N/A:N/A] % Reduction in Volume: 87.30% [N/A:N/A] Classification: [1:Full Thickness With Exposed Support Structures] [N/A:N/A] HBO Classification: [1:Grade 1] [N/A:N/A] Exudate Amount: [1:Large] [N/A:N/A] Exudate Type: [1:Serous] [N/A:N/A] Exudate Color: [1:amber] [N/A:N/A] Wound Margin: [1:Distinct, outline attached N/A] Granulation Amount: [1:Large (67-100%)] [N/A:N/A] Granulation Quality: Pink, Pale N/A N/A Necrotic Amount: Small (1-33%) N/A N/A Exposed Structures: Fascia: No N/A N/A Fat Layer (Subcutaneous Tissue) Exposed: No Tendon: No Muscle: No Joint: No Bone: No Epithelialization: Medium (34-66%) N/A N/A Periwound Skin Texture: Callus: Yes N/A N/A Scarring: Yes Excoriation: No Induration: No Crepitus: No Rash: No Periwound Skin Dry/Scaly: Yes N/A N/A Moisture: Maceration: No Periwound Skin Color: Atrophie Blanche: No N/A N/A Cyanosis: No Ecchymosis: No Erythema: No Hemosiderin Staining:  No Mottled: No Pallor: No Rubor: No Temperature: No Abnormality N/A N/A Tenderness on Yes N/A N/A Palpation: Wound Preparation: Ulcer Cleansing: N/A N/A Rinsed/Irrigated with Saline Topical Anesthetic Applied: Other: lidocaine 4% Treatment Notes Wound #1 (Right Amputation Site - Below Knee) 1. Cleansed with: Clean wound with Normal Saline 2. Anesthetic Topical Lidocaine 4% cream to wound bed prior to debridement 3. Peri-wound Care: Skin Prep 4. Dressing Applied: Iodosorb Ointment 5. Secondary Dressing Applied Bordered Foam Dressing BLAS, RICHES ([id number]) Dry Gauze Electronic Signature(s) Signed: 03/14/2017 4:47:51 PM By: Linton Ham MD Entered By: Linton Ham on 03/14/2017 10:12:30 Andrew Lyons ([id number]) -------------------------------------------------------------------------------- [place] Details Patient Name: Andrew Lyons Date of Service: 03/14/2017 8:45 AM Medical Record Number: [id number] Patient Account Number: [id number] Date of Birth/Sex: [birth year] (71 y.o. Male) Treating RN: [name], [name] Primary Care Miyeko Mahlum: [name], Delfino Lovett Other Clinician: Referring Axzel Rockhill: [name], [name] Treating Fleurette Woolbright/Extender: [name] in Treatment: 9 Active Inactive ` Abuse / Safety / Falls / Self Care Management Nursing Diagnoses: Potential for falls Goals: Patient will remain injury free Date Initiated: 01/04/2017 Target Resolution Date: 04/01/2017 Goal Status: Active Interventions: Assess fall risk on admission and as needed Assess self care needs on admission and as needed Notes: ` Orientation to the Wound Care Program Nursing Diagnoses: Knowledge deficit related to the wound healing center program Goals: Patient/caregiver will verbalize understanding of the [place] Program Date Initiated: 01/04/2017 Target Resolution Date: 01/28/2017 Goal Status:  Active Interventions: Provide education on orientation to the wound center Notes: ` Wound/Skin Impairment Nursing Diagnoses: Impaired tissue integrity ZAKARIE, STURDIVANT ([id number]) Knowledge deficit related to ulceration/compromised skin integrity Goals: Ulcer/skin breakdown will have a volume reduction of 80% by week 12 Date Initiated: 01/04/2017 Target Resolution Date: 04/22/2017 Goal Status: Active Interventions: Assess patient/caregiver ability to perform ulcer/skin care regimen upon admission and as needed Assess ulceration(s) every visit Notes: Electronic Signature(s) Signed: 03/14/2017 5:11:11 PM By: Alric Quan Entered By: Alric Quan on 03/14/2017 08:56:55 Andrew Lyons ([id number]) -------------------------------------------------------------------------------- Pain Assessment Details Patient Name: Andrew Lyons Date of Service: 03/14/2017 8:45 AM Medical Record Number: [id number] Patient Account Number: [id number] Date of Birth/Sex: Jan 21, 1946 (71 y.o. Male) Treating RN: [name], [name] Primary Care Emmali Karow: [name], Delfino Lovett Other Clinician: Referring Joquan Lotz: [name], [name] Treating Kabe Mckoy/Extender: [name] in Treatment: 9 Active Problems Location of Pain Severity and Description of Pain Patient Has Paino No Site Locations With Dressing Change: No Pain Management and Medication Current Pain Management: Electronic Signature(s) Signed: 03/14/2017 5:11:11 PM By: Alric Quan Entered By: [name],  Debra on 03/14/2017 08:48:23 ZYRION, COEY ([id number]) -------------------------------------------------------------------------------- Patient/Caregiver Education Details [name] Date of Service: 03/14/2017 8:45 AM Patient Name: T. Patient Account Number: [id number] Medical Record Treating RN: [name] [id number] Number: Other Clinician: Date of Birth/Gender: 10-15-1945 (71 y.o. Male)  Treating Linton Ham Primary Care Physician: [name] Physician/Extender: G Referring Physician: [name] in Treatment: 9 Education Assessment Education Provided To: Patient Education Topics Provided Wound/Skin Impairment: Handouts: Other: change dressing as ordered Methods: Demonstration, Explain/Verbal Responses: State content correctly Electronic Signature(s) Signed: 03/14/2017 5:11:11 PM By: Alric Quan Entered By: Alric Quan on 03/14/2017 09:01:13 Andrew Lyons ([id number]) -------------------------------------------------------------------------------- Wound Assessment Details Patient Name: Andrew Lyons Date of Service: 03/14/2017 8:45 AM Medical Record Number: [id number] Patient Account Number: [id number] Date of Birth/Sex: 1946-02-17 (71 y.o. Male) Treating RN: [name], [name] Primary Care Kaley Jutras: [name], Delfino Lovett Other Clinician: Referring Jannessa Ogden: [name], [name] Treating Jaquan Sadowsky/Extender: [name] in Treatment: 9 Wound Status Wound Number: 1 Primary Atypical Etiology: Wound Location: Right Amputation Site - Below Knee Wound Open Status: Wounding Event: Gradually Appeared Comorbid Cataracts, Chronic Obstructive Date Acquired: 08/26/2016 History: Pulmonary Disease (COPD), Sleep Weeks Of Treatment: 9 Apnea, Hypotension, Myocardial Clustered Wound: No Infarction, Type II Diabetes, Osteoarthritis, Neuropathy Wound Measurements Length: (cm) 0.2 Width: (cm) 0.2 Depth: (cm) 0.3 Area: (cm) 0.031 Volume: (cm) 0.009 % Reduction in Area: 60.8% % Reduction in Volume: 87.3% Epithelialization: Medium (34-66%) Tunneling: No Undermining: No Wound Description Full Thickness With Exposed Foul Odor Af Classification: Support Structures Slough/Fibri Diabetic Severity Grade 1 (Wagner): Wound Margin: Distinct, outline attached Exudate Amount: Large Exudate Type: Serous Exudate Color:  amber ter Cleansing: No no Yes Wound Bed Granulation Amount: Large (67-100%) Exposed Structure Granulation Quality: Pink, Pale Fascia Exposed: No Necrotic Amount: Small (1-33%) Fat Layer (Subcutaneous Tissue) Exposed: No Necrotic Quality: Adherent Slough Tendon Exposed: No Muscle Exposed: No Joint Exposed: No Bone Exposed: No Periwound Skin Texture Texture Color No Abnormalities Noted: No No Abnormalities Noted: No YONI, LOBOS ([id number]) Callus: Yes Atrophie Blanche: No Crepitus: No Cyanosis: No Excoriation: No Ecchymosis: No Induration: No Erythema: No Rash: No Hemosiderin Staining: No Scarring: Yes Mottled: No Pallor: No Moisture Rubor: No No Abnormalities Noted: No Dry / Scaly: Yes Temperature / Pain Maceration: No Temperature: No Abnormality Tenderness on Palpation: Yes Wound Preparation Ulcer Cleansing: Rinsed/Irrigated with Saline Topical Anesthetic Applied: Other: lidocaine 4%, Treatment Notes Wound #1 (Right Amputation Site - Below Knee) 1. Cleansed with: Clean wound with Normal Saline 2. Anesthetic Topical Lidocaine 4% cream to wound bed prior to debridement 3. Peri-wound Care: Skin Prep 4. Dressing Applied: Iodosorb Ointment 5. Secondary Dressing Applied Bordered Foam Dressing Dry Gauze Electronic Signature(s) Signed: 03/14/2017 5:11:11 PM By: Alric Quan Entered By: Alric Quan on 03/14/2017 08:55:37 Andrew Lyons ([id number]) -------------------------------------------------------------------------------- [place] Details Patient Name: Andrew Lyons Date of Service: 03/14/2017 8:45 AM Medical Record Number: [id number] Patient Account Number: [id number] Date of Birth/Sex: Aug 24, 1946 (71 y.o. Male) Treating RN: [name], [name] Primary Care Buck Mcaffee: [name], Delfino Lovett Other Clinician: Referring Akshara Blumenthal: [name], [name] Treating Cardell Rachel/Extender: [name] in Treatment: 9 Vital Signs Time  Taken: 08:48 Temperature (F): 98.0 Height (in): 70 Pulse (bpm): 60 Weight (lbs): 266.5 Respiratory Rate (breaths/min): 18 Body Mass Index (BMI): 38.2 Blood Pressure (mmHg): 120/57 Reference Range: 80 - 120 mg / dl Electronic Signature(s) Signed: 03/14/2017 5:11:11 PM By: Alric Quan Entered By: Alric Quan on 03/14/2017 08:50:54

## 2017-03-15 NOTE — Progress Notes (Signed)
EMELIO, SCHNELLER ([id number]) Visit Report for 03/14/2017 Chief Complaint Document Details Andrew Lyons Date of Service: 03/14/2017 8:45 AM Patient Name: T. Patient Account Number: [id number] Medical Record Treating RN: [name] [id number] Number: Other Clinician: Date of Birth/Sex: 22-Mar-1946 (71 y.o. 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 Electronic Signature(s) Signed: 03/14/2017 4:47:51 PM By: Linton Ham MD Entered By: Linton Ham on 03/14/2017 10:12:40 [name] ([id number]) -------------------------------------------------------------------------------- HPI Details Andrew Lyons Date of Service: 03/14/2017 8:45 AM Patient Name: T. Patient Account Number: [id number] Medical Record Treating RN: [name] [id number] Number: Other Clinician: Date of Birth/Sex: 1945/11/29 (71 y.o. Male) Treating Linton Ham Primary Care Provider: [name] Provider/Extender: G Referring Provider: [name] in Treatment: 9 History of Present Illness HPI Description: 01/04/17; this is a 71 year old diabetic man who has a remote history of a traumatic lower extremity damage at age 58 requiring an amputation. He tells me spent 2 years walking on crutches then ultimately has been walking on prosthesis without any trouble since then. Several months ago he had a new prosthesis and developed an open area in the right stump in December. He saw podiatry Dr. Prudence Davidson who noted that this was really out of his practice jurisdiction and referred him here. He has been using mupirocin. He has continued to walk on the prosthesis. The prosthesis itself as been adjusted by  the prostatitis. He does not have a known arterial issue. He tells me he probably has diabetic neuropathy. He had a wound that took a long time to heal surrounding the actual amputation itself however is not had more recent wounds. 01/10/17 Culture from last week grew pseudomonas. change AB to cefdinir yesterday from doxy. he is not using his prosthesis which I emphasized although he is having it adjusted next week 01/17/17; he is completing his antibiotics in the next day or 2. We've been using silver alginate. His prosthesis is away being readjusted. He had an x-ray of the underlying bone here apparently at his podiatrist's office although I'll need to have a look at that and we can't find the result then he may need this re-x-rayed. 01/24/17; he has completed his antibiotics. Change to Silver collagen last week. No real change in the wound this week. 01/31/17 repeat culture I did last week showed a few Pseudomonas. This only has intermediate sensitivity to quinolones I therefore put him back on a week of Cefdinir. X-ray of the stump did not show any bone destruction. We are attempting to get vascular studies done through Dr.Arida's office on May 23. 02/07/17; arterial studies are booked for May 23 no major change. He has completed his antibiotics 02/14/17 arterial studies on Thursday 5/23. using collagen 02/21/17; the patient had his arterial studies. Both his ABIs were good at 1.0 on the right and 1.1 on the left. His abdominal and iliac arteries revealed atherosclerosis without focal stenosis duplex imaging of the right lower leg revealed heterogeneous plaque and biphasic Doppler waveforms from the right CFA to the popliteal artery. The right distal SFA and tibial peroneal trunk velocities were elevated. 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 The proximal right ATA and PTA were patent above the amputation level. We have been using Silver collagen with some improvement in the  wound dimensions  02/28/17; we went ahead and did TCOM measurements on this patient. One of the leads was nonfunctional however on the right leg both the proximal and periwound leads registered greater than 70 mmHg oxygen tension. Interestingly the comparison lead on the left leg/relative wound level was in the mid 40s. This allays any fears I had about contributing wound ischemia. In the meantime is measurements today were 0.2 x 0.4 x 0.7 the wound is now a small divot most of it appears to be epithelialized with only a small open area at that depth. There is no evidence of surrounding SHRIHAN, PUTT T. ([id number]) infection 03/07/17; continued improvement down to 0.3 cm in depth. This is a very tiny circular opening. No drainage no tenderness. Looks like it is been progressing towards closure. He has been using Silver collagen although I think this is too small to use this as a primary dressing. 03/14/17; still 0.3 cm in depth tiny circular wound. No drainage or tenderness. Used Iodosorb ointment starting last week Electronic Signature(s) Signed: 03/14/2017 4:47:51 PM By: Linton Ham MD Entered By: Linton Ham on 03/14/2017 10:13:13 [name] ([id number]) -------------------------------------------------------------------------------- Physical Exam Details Andrew Lyons Date of Service: 03/14/2017 8:45 AM Patient Name: T. Patient Account Number: [id number] Medical Record Treating RN: [name] [id number] Number: Other Clinician: Date of Birth/Sex: Mar 27, 1946 (71 y.o. Male) Treating Linton Ham Primary Care Provider: [name] Provider/Extender: G Referring Provider: [name], [name] Weeks in Treatment: 9 Constitutional Sitting or standing Blood Pressure is within target range for patient.. Pulse regular and within target range for patient.Marland Kitchen Respirations regular, non-labored and within target range.. Temperature is normal and within the target  range for the patient.Marland Kitchen appears in no distress. Eyes Conjunctivae clear. No discharge. Respiratory Respiratory effort is easy and symmetric bilaterally. Rate is normal at rest and on room air.. Cardiovascular Femoral arteries are palpable, popliteal pulses are not however his previous arterial evaluation is been good. Lymphatic None palpable in the popliteal or inguinal area. Notes Wound exam; 0.3 cm in depth which is about the same as last time. No evidence of surrounding infection no tenderness. He has been religiously offloading this Electronic Signature(s) Signed: 03/14/2017 4:47:51 PM By: Linton Ham MD Entered By: Linton Ham on 03/14/2017 10:16:26 [name] ([id number]) -------------------------------------------------------------------------------- Physician Orders Details Andrew Lyons Date of Service: 03/14/2017 8:45 AM Patient Name: T. Patient Account Number: [id number] Medical Record Treating RN: [name] [id number] Number: Other Clinician: Date of Birth/Sex: Nov 25, 1945 (71 y.o. Male) Treating Linton Ham Primary Care Provider: [name] Provider/Extender: G Referring Provider: [name] in Treatment: 9 Verbal / Phone Orders: Yes Clinician: [name], [name] Read Back and Verified: Yes Diagnosis Coding Wound Cleansing Wound #1 Right Amputation Site - Below Knee o Clean wound with Normal Saline. o Cleanse wound with mild soap and water Anesthetic Wound #1 Right Amputation Site - Below Knee o Topical Lidocaine 4% cream applied to wound bed prior to debridement - in clinic only Skin Barriers/Peri-Wound Care Wound #1 Right Amputation Site - Below Knee o Skin Prep Primary Wound Dressing Wound #1 Right Amputation Site - Below Knee o Iodosorb Ointment Secondary Dressing Wound #1 Right Amputation Site - Below Knee o Dry Gauze o Boardered Foam Dressing Dressing Change Frequency Wound #1 Right  Amputation Site - Below Knee o Change dressing every other day. Follow-up Appointments Wound #1 Right Amputation Site - Below Knee o Return Appointment in 1 week. Edema Control Wound #1 Right Amputation Site - Below Knee o  Elevate legs to the level of the heart and pump ankles as often as possible STEPHAN, NELIS T. ([id number]) Off-Loading o Other: - do not wear your prosthesis Additional Orders / Instructions Wound #1 Right Amputation Site - Below Knee o Increase protein intake. Medications-please add to medication list. Wound #1 Right Amputation Site - Below Knee o Other: - vitamin C, zinc, MVI Electronic Signature(s) Signed: 03/14/2017 4:47:51 PM By: Linton Ham MD Signed: 03/14/2017 5:11:11 PM By: Alric Quan Entered By: Alric Quan on 03/14/2017 09:18:09 [name] ([id number]) -------------------------------------------------------------------------------- Problem List Details Andrew Lyons Date of Service: 03/14/2017 8:45 AM Patient Name: T. Patient Account Number: [id number] Medical Record Treating RN: [name] [id number] Number: Other Clinician: Date of Birth/Sex: 07/31/1946 (71 y.o. 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 Patient Account Number: [id number] Medical Record Treating RN: [name] [id number] Number: Other Clinician: Date of Birth/Sex: 04/28/1946 (71 y.o. 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 History of Present Illness (HPI) 01/04/17; this is a 71 year old diabetic man who has a remote history of a traumatic lower extremity damage at age 3 requiring an amputation. He tells me spent 2 years walking on crutches then ultimately has been walking on prosthesis without any trouble since then. Several months ago he had a new prosthesis and developed an open area in the right stump in December. He saw podiatry Dr. Prudence Davidson who noted that this was really out of his practice jurisdiction and referred him here. He has been using mupirocin. He has continued to walk on the prosthesis. The prosthesis itself as been adjusted by the prostatitis. He does not have a known arterial issue. He tells me he probably has diabetic neuropathy. He had a wound that took a long time to heal surrounding the actual amputation itself however is not had more recent wounds. 01/10/17 Culture from last week grew pseudomonas. change AB to cefdinir yesterday from doxy. he is not using his prosthesis which I emphasized although he is having it adjusted next week 01/17/17; he is completing his antibiotics in the next day or 2. We've been using silver alginate. His prosthesis is away being readjusted. He had an x-ray of the underlying bone here apparently at his podiatrist's  office although I'll need to have a look at that and we can't find the result then he may need this re-x-rayed. 01/24/17; he has completed his antibiotics. Change to Silver collagen last week. No real change in the wound this week. 01/31/17 repeat culture I did last week showed a few Pseudomonas. This only has intermediate sensitivity to quinolones I therefore put him back on a week of Cefdinir.  X-ray of the stump did not show any bone destruction. We are attempting to get vascular studies done through Dr.Arida's office on May 23. 02/07/17; arterial studies are booked for May 23 no major change. He has completed his antibiotics 02/14/17 arterial studies on Thursday 5/23. using collagen 02/21/17; the patient had his arterial studies. Both his ABIs were good at 1.0 on the right and 1.1 on the left. His abdominal and iliac arteries revealed atherosclerosis without focal stenosis duplex imaging of the right lower leg revealed heterogeneous plaque and biphasic Doppler waveforms from the right CFA to the popliteal artery. The right distal SFA and tibial peroneal trunk velocities were elevated. 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 The proximal right ATA and PTA were patent above the amputation level. We have been using Silver collagen with some improvement in the wound dimensions 02/28/17; we went ahead and did TCOM measurements on this patient. One of the leads was nonfunctional however on the right leg both the proximal and periwound leads registered greater than 70 mmHg oxygen Bucholz, Rodarius T. ([id number]) tension. Interestingly the comparison lead on the left leg/relative wound level was in the mid 40s. This allays any fears I had about contributing wound ischemia. In the meantime is measurements today were 0.2 x 0.4 x 0.7 the wound is now a small divot most of it appears to be epithelialized with only a small open area at that depth. There is no evidence of  surrounding infection 03/07/17; continued improvement down to 0.3 cm in depth. This is a very tiny circular opening. No drainage no tenderness. Looks like it is been progressing towards closure. He has been using Silver collagen although I think this is too small to use this as a primary dressing. 03/14/17; still 0.3 cm in depth tiny circular wound. No drainage or tenderness. Used Iodosorb ointment starting last week Objective Constitutional Sitting or standing Blood Pressure is within target range for patient.. Pulse regular and within target range for patient.Marland Kitchen Respirations regular, non-labored and within target range.. Temperature is normal and within the target range for the patient.Marland Kitchen appears in no distress. Vitals Time Taken: 8:48 AM, Height: 70 in, Weight: 266.5 lbs, BMI: 38.2, Temperature: 98.0 F, Pulse: 60 bpm, Respiratory Rate: 18 breaths/min, Blood Pressure: 120/57 mmHg. Eyes Conjunctivae clear. No discharge. Respiratory Respiratory effort is easy and symmetric bilaterally. Rate is normal at rest and on room air.. Cardiovascular Femoral arteries are palpable, popliteal pulses are not however his previous arterial evaluation is been good. Lymphatic None palpable in the popliteal or inguinal area. General Notes: Wound exam; 0.3 cm in depth which is about the same as last time. No evidence of surrounding infection no tenderness. He has been religiously offloading this Integumentary (Hair, Skin) Wound #1 status is Open. Original cause of wound was Gradually Appeared. 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 The wound measures 0.2cm length x 0.2cm width x 0.3cm depth; 0.031cm^2 area and 0.009cm^3 volume. There is no tunneling or undermining noted. There is a large amount of serous drainage noted. The wound margin is distinct with the outline attached to the wound base. DARRELD, HOFFER ([id number]) There is large (67-100%) pink, pale granulation within  the wound bed. There is a small (1-33%) amount of necrotic tissue within the wound bed including Adherent Slough. The periwound skin appearance exhibited: Callus, Scarring, Dry/Scaly. The periwound skin appearance did not exhibit: Crepitus, Excoriation, Induration, Rash, Maceration, Atrophie Blanche, Cyanosis, Ecchymosis, Hemosiderin Staining, Mottled, Pallor,  Rubor, Erythema. Periwound temperature was noted as No Abnormality. The periwound has tenderness on palpation. 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 Cleanse wound with mild soap and water Anesthetic: Wound #1 Right Amputation Site - Below Knee: Topical Lidocaine 4% cream applied to wound bed prior to debridement - in clinic only Skin Barriers/Peri-Wound Care: Wound #1 Right Amputation Site - Below Knee: Skin Prep Primary Wound Dressing: Wound #1 Right Amputation Site - Below Knee: Iodosorb Ointment Secondary Dressing: Wound #1 Right Amputation Site - Below Knee: Dry Gauze Boardered Foam Dressing Dressing Change Frequency: Wound #1 Right Amputation Site - Below Knee: Change dressing every other day. Follow-up Appointments: Wound #1 Right Amputation Site - Below Knee: AAMIR, MCLINDEN ([id number]) Return Appointment in 1 week. Edema Control: Wound #1 Right Amputation Site - Below Knee: Elevate legs to the level of the heart and pump ankles as often as possible Off-Loading: Other: - do not wear your prosthesis Additional Orders / Instructions: Wound #1 Right Amputation Site - Below Knee: Increase protein intake. Medications-please add to  medication list.: Wound #1 Right Amputation Site - Below Knee: Other: - vitamin C, zinc, MVI #1 no change to the primary dressing which is largely Iodosorb. I'm hoping to pull the small area together although it certainly hasn't happened in the first week of usage. #2 no evidence of infection Electronic Signature(s) Signed: 03/14/2017 4:47:51 PM By: Linton Ham MD Entered By: Linton Ham on 03/14/2017 10:17:01 [name] ([id number]) -------------------------------------------------------------------------------- SuperBill Details Andrew Lyons Date of Service: 03/14/2017 Patient Name: T. Patient Account Number: [id number] Medical Record Treating RN: [name] [id number] Number: Other Clinician: Date of Birth/Sex: December 14, 1945 (71 y.o. 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, right lower leg, S88.111D subsequent encounter L03.115 Cellulitis of right lower limb Facility Procedures CPT4 Code: [id number] Description: 99213 - WOUND CARE VISIT-LEV 3 EST PT Modifier: Quantity: 1 Physician Procedures CPT4 Code Description: [id number] - WC PHYS LEVEL 3 - EST PT ICD-10 Description Diagnosis E11.622 Type 2 diabetes mellitus with other skin ulcer L97.313 Non-pressure chronic ulcer of right ankle with necr Modifier: osis of muscl Quantity: 1 e Electronic Signature(s) Signed: 03/14/2017 4:47:51 PM By: Linton Ham MD Signed: 03/14/2017 5:11:11 PM By: Alric Quan Entered By: Alric Quan on 03/14/2017 12:34:53

## 2017-03-21 ENCOUNTER — Encounter: Payer: PPO | Admitting: Internal Medicine

## 2017-03-21 DIAGNOSIS — E11622 Type 2 diabetes mellitus with other skin ulcer: Secondary | ICD-10-CM | POA: Diagnosis not present

## 2017-03-21 DIAGNOSIS — T8789 Other complications of amputation stump: Secondary | ICD-10-CM | POA: Diagnosis not present

## 2017-03-22 NOTE — Progress Notes (Signed)
Andrew Lyons, Andrew Lyons ([id number]) Visit Report for 03/21/2017 Arrival Information Details Patient Name: Andrew Lyons, Andrew Lyons Date of Service: 03/21/2017 8:45 AM Medical Record Number: [id number] Patient Account Number: [id number] Date of Birth/Sex: [birth year] (71 y.o. Male) Treating RN: [name], [name] Primary Care Shanel Prazak: [name], Delfino Lovett Other Clinician: Referring Wei Newbrough: [name], [name] Treating Brittanny Levenhagen/Extender: [name] in Treatment: 10 Visit Information History Since Last Visit All ordered tests and consults were completed: No Patient Arrived: Other Added or deleted any medications: No Arrival Time: 08:47 Any new allergies or adverse reactions: No Accompanied By: self Had a fall or experienced change in No Transfer Assistance: EasyP[employer] activities of daily living that may affect Patient Lift risk of falls: Patient Identification Verified: Yes Signs or symptoms of abuse/neglect since last No Secondary Verification Process Yes visito Completed: Hospitalized since last visit: No Patient Requires Transmission- No Has Dressing in Place as Prescribed: Yes Based Precautions: Pain Present Now: No Patient Has Alerts: Yes Patient Alerts: DM II Electronic Signature(s) Signed: 03/21/2017 4:41:59 PM By: Alric Quan Entered By: Alric Quan on 03/21/2017 08:48:21 Andrew Lyons ([id number]) -------------------------------------------------------------------------------- Encounter Discharge Information Details Patient Name: Andrew Lyons Date of Service: 03/21/2017 8:45 AM Medical Record Number: [id number] Patient Account Number: [id number] Date of Birth/Sex: 07/10/1946 (71 y.o. Male) Treating RN: [name], [name] Primary Care Tashai Catino: [name], Delfino Lovett Other Clinician: Referring Mckynzi Cammon: [name], [name] Treating Annalucia Laino/Extender: [name] in Treatment: 10 Encounter Discharge Information Items Discharge Pain  Level: 0 Discharge Condition: Stable Discharge Destination: Home Transportation: Private Auto Accompanied By: self Schedule Follow-up Appointment: Yes Medication Reconciliation completed No and provided to Patient/Care Jesstin Studstill: Provided on Clinical Summary of Care: 03/21/2017 Form Type Recipient Paper Patient KA Electronic Signature(s) Signed: 03/21/2017 9:32:04 AM By: Ruthine Dose Entered By: Ruthine Dose on 03/21/2017 09:32:04 Andrew Lyons ([id number]) -------------------------------------------------------------------------------- Lower Extremity Assessment Details Patient Name: Andrew Lyons Date of Service: 03/21/2017 8:45 AM Medical Record Number: [id number] Patient Account Number: [id number] Date of Birth/Sex: Sep 20, 1946 (71 y.o. Male) Treating RN: [name], [name] Primary Care Holmes Hays: [name], Delfino Lovett Other Clinician: Referring Grant Swager: [name], [name] Treating Karli Wickizer/Extender: [name] in Treatment: 10 Vascular Assessment Pulses: Posterior Tibial Extremity colors, hair growth, and conditions: Extremity Color: [Right:Normal] Temperature of Extremity: [Right:Warm] Capillary Refill: [Right:< 3 seconds] Electronic Signature(s) Signed: 03/21/2017 4:41:59 PM By: Alric Quan Entered By: Alric Quan on 03/21/2017 08:56:23 Andrew Lyons ([id number]) -------------------------------------------------------------------------------- Multi Wound Chart Details Patient Name: Andrew Lyons Date of Service: 03/21/2017 8:45 AM Medical Record Number: [id number] Patient Account Number: [id number] Date of Birth/Sex: October 21, 1945 (71 y.o. Male) Treating RN: [name], [name] Primary Care Bexley Laubach: [name], Delfino Lovett Other Clinician: Referring Silveria Botz: [name], [name] Treating Lounette Sloan/Extender: [name] in Treatment: 10 Vital Signs Height(in): 70 Pulse(bpm): 57 Weight(lbs): 266.5 Blood  Pressure 140/58 (mmHg): Body Mass Index(BMI): 38 Temperature(F): 97.6 Respiratory Rate 18 (breaths/min): Photos: [1:No Photos] [N/A:N/A] Wound Location: [1:Right Amputation Site - Below Knee] [N/A:N/A] Wounding Event: [1:Gradually Appeared] [N/A:N/A] Primary Etiology: [1:Atypical] [N/A:N/A] Comorbid History: [1:Cataracts, Chronic Obstructive Pulmonary Disease (COPD), Sleep Apnea, Hypotension, Myocardial Infarction, Type II Diabetes, Osteoarthritis, Neuropathy] [N/A:N/A] Date Acquired: [1:08/26/2016] [N/A:N/A] Weeks of Treatment: [1:10] [N/A:N/A] Wound Status: [1:Open] [N/A:N/A] Measurements L x W x D 0.2x0.2x0.3 [N/A:N/A] (cm) Area (cm) : [1:0.031] [N/A:N/A] Volume (cm) : [1:0.009] [N/A:N/A] % Reduction in Area: [1:60.80%] [N/A:N/A] % Reduction in Volume: 87.30% [N/A:N/A] Classification: [1:Full Thickness With Exposed Support Structures] [N/A:N/A] HBO Classification: [1:Grade 1] [N/A:N/A] Exudate Amount: [1:Large] [N/A:N/A] Exudate Type: [1:Serous] [N/A:N/A]  Exudate Color: [1:amber] [N/A:N/A] Wound Margin: [1:Distinct, outline attached N/A] Granulation Amount: [1:Large (67-100%)] [N/A:N/A] Granulation Quality: Pink, Pale N/A N/A Necrotic Amount: Small (1-33%) N/A N/A Exposed Structures: Fascia: No N/A N/A Fat Layer (Subcutaneous Tissue) Exposed: No Tendon: No Muscle: No Joint: No Bone: No Epithelialization: Medium (34-66%) N/A N/A Debridement: Debridement (93716- N/A N/A 11047) Pre-procedure 09:20 N/A N/A Verification/Time Out Taken: Pain Control: Lidocaine 4% Topical N/A N/A Solution Tissue Debrided: Fibrin/Slough, Exudates, N/A N/A Subcutaneous Level: Skin/Subcutaneous N/A N/A Tissue Debridement Area (sq 0.04 N/A N/A cm): Instrument: Curette N/A N/A Bleeding: Minimum N/A N/A Hemostasis Achieved: Pressure N/A N/A Procedural Pain: 0 N/A N/A Post Procedural Pain: 0 N/A N/A Debridement Treatment Procedure was tolerated N/A N/A Response: well Post  Debridement 0.2x0.2x0.3 N/A N/A Measurements L x W x D (cm) Post Debridement 0.009 N/A N/A Volume: (cm) Periwound Skin Texture: Callus: Yes N/A N/A Scarring: Yes Excoriation: No Induration: No Crepitus: No Rash: No Periwound Skin Dry/Scaly: Yes N/A N/A Moisture: Maceration: No Periwound Skin Color: Atrophie Blanche: No N/A N/A Cyanosis: No Ecchymosis: No Erythema: No Hemosiderin Staining: No Mottled: No AMROM, ORE. ([id number]) Pallor: No Rubor: No Temperature: No Abnormality N/A N/A Tenderness on Yes N/A N/A Palpation: Wound Preparation: Ulcer Cleansing: N/A N/A Rinsed/Irrigated with Saline Topical Anesthetic Applied: Other: lidocaine 4% Procedures Performed: Debridement N/A N/A Treatment Notes Wound #1 (Right Amputation Site - Below Knee) 1. Cleansed with: Clean wound with Normal Saline 2. Anesthetic Topical Lidocaine 4% cream to wound bed prior to debridement 3. Peri-wound Care: Skin Prep 4. Dressing Applied: Prisma Ag 5. Secondary Dressing Applied Bordered Foam Dressing Dry Gauze Electronic Signature(s) Signed: 03/21/2017 4:49:17 PM By: Linton Ham MD Entered By: Linton Ham on 03/21/2017 12:56:09 Andrew Lyons ([id number]) -------------------------------------------------------------------------------- Multi-Disciplinary Care Plan Details Patient Name: Andrew Lyons Date of Service: 03/21/2017 8:45 AM Medical Record Number: [id number] Patient Account Number: [id number] Date of Birth/Sex: 1946/03/02 (71 y.o. Male) Treating RN: [name], [name] Primary Care Hasson Gaspard: [name], Delfino Lovett Other Clinician: Referring Zlatan Hornback: [name], [name] Treating Dorsey Charette/Extender: [name] in Treatment: 10 Active Inactive ` Abuse / Safety / Falls / Self Care Management Nursing Diagnoses: Potential for falls Goals: Patient will remain injury free Date Initiated: 01/04/2017 Target Resolution Date: 04/01/2017 Goal  Status: Active Interventions: Assess fall risk on admission and as needed Assess self care needs on admission and as needed Notes: ` Orientation to the Wound Care Program Nursing Diagnoses: Knowledge deficit related to the wound healing center program Goals: Patient/caregiver will verbalize understanding of the [place] Program Date Initiated: 01/04/2017 Target Resolution Date: 01/28/2017 Goal Status: Active Interventions: Provide education on orientation to the wound center Notes: ` Wound/Skin Impairment Nursing Diagnoses: Impaired tissue integrity Andrew Lyons, Andrew Lyons ([id number]) Knowledge deficit related to ulceration/compromised skin integrity Goals: Ulcer/skin breakdown will have a volume reduction of 80% by week 12 Date Initiated: 01/04/2017 Target Resolution Date: 04/22/2017 Goal Status: Active Interventions: Assess patient/caregiver ability to perform ulcer/skin care regimen upon admission and as needed Assess ulceration(s) every visit Notes: Electronic Signature(s) Signed: 03/21/2017 4:41:59 PM By: Alric Quan Entered By: Alric Quan on 03/21/2017 08:56:27 Andrew Lyons ([id number]) -------------------------------------------------------------------------------- Pain Assessment Details Patient Name: Andrew Lyons Date of Service: 03/21/2017 8:45 AM Medical Record Number: [id number] Patient Account Number: [id number] Date of Birth/Sex: [birth year] (71 y.o. Male) Treating RN: [name], [name] Primary Care Renelle Stegenga: [name], Delfino Lovett Other Clinician: Referring Jeremiah Curci: [name], [name] Treating Terri Malerba/Extender: [name] in Treatment: 10 Active Problems Location of Pain  Severity and Description of Pain Patient Has Paino No Site Locations With Dressing Change: No Pain Management and Medication Current Pain Management: Electronic Signature(s) Signed: 03/21/2017 4:41:59 PM By: Alric Quan Entered By:  Alric Quan on 03/21/2017 08:48:28 Andrew Lyons ([id number]) -------------------------------------------------------------------------------- Patient/Caregiver Education Details Andrew Lyons Date of Service: 03/21/2017 8:45 AM Patient Name: T. Patient Account Number: [id number] Medical Record Treating RN: [name] [id number] Number: Other Clinician: Date of Birth/Gender: 1945-12-27 (71 y.o. Male) Treating Linton Ham Primary Care Physician: [name] Physician/Extender: G Referring Physician: [name] in Treatment: 10 Education Assessment Education Provided To: Patient Education Topics Provided Wound/Skin Impairment: Handouts: Other: change dressing as ordered Methods: Demonstration, Explain/Verbal Responses: State content correctly Electronic Signature(s) Signed: 03/21/2017 4:41:59 PM By: Alric Quan Entered By: Alric Quan on 03/21/2017 08:58:05 Andrew Lyons ([id number]) -------------------------------------------------------------------------------- Wound Assessment Details Patient Name: Andrew Lyons Date of Service: 03/21/2017 8:45 AM Medical Record Number: [id number] Patient Account Number: [id number] Date of Birth/Sex: 1946/09/21 (71 y.o. Male) Treating RN: [name], [name] Primary Care Zamari Bonsall: [name], Delfino Lovett Other Clinician: Referring Chao Blazejewski: [name], [name] Treating Maytte Jacot/Extender: [name] in Treatment: 10 Wound Status Wound Number: 1 Primary Atypical Etiology: Wound Location: Right Amputation Site - Below Knee Wound Open Status: Wounding Event: Gradually Appeared Comorbid Cataracts, Chronic Obstructive Date Acquired: 08/26/2016 History: Pulmonary Disease (COPD), Sleep Weeks Of Treatment: 10 Apnea, Hypotension, Myocardial Clustered Wound: No Infarction, Type II Diabetes, Osteoarthritis, Neuropathy Photos Photo Uploaded By: Alric Quan on 03/21/2017  15:49:20 Wound Measurements Length: (cm) 0.2 Width: (cm) 0.2 Depth: (cm) 0.3 Area: (cm) 0.031 Volume: (cm) 0.009 % Reduction in Area: 60.8% % Reduction in Volume: 87.3% Epithelialization: Medium (34-66%) Tunneling: No Undermining: No Wound Description Full Thickness With Exposed Classification: Support Structures Diabetic Severity Grade 1 (Wagner): Wound Margin: Distinct, outline attached Exudate Amount: Large Exudate Type: Serous Exudate Color: amber Andrew Lyons, Andrew Lyons ([id number]) Foul Odor After Cleansing: No Slough/Fibrino Yes Wound Bed Granulation Amount: Large (67-100%) Exposed Structure Granulation Quality: Pink, Pale Fascia Exposed: No Necrotic Amount: Small (1-33%) Fat Layer (Subcutaneous Tissue) Exposed: No Necrotic Quality: Adherent Slough Tendon Exposed: No Muscle Exposed: No Joint Exposed: No Bone Exposed: No Periwound Skin Texture Texture Color No Abnormalities Noted: No No Abnormalities Noted: No Callus: Yes Atrophie Blanche: No Crepitus: No Cyanosis: No Excoriation: No Ecchymosis: No Induration: No Erythema: No Rash: No Hemosiderin Staining: No Scarring: Yes Mottled: No Pallor: No Moisture Rubor: No No Abnormalities Noted: No Dry / Scaly: Yes Temperature / Pain Maceration: No Temperature: No Abnormality Tenderness on Palpation: Yes Wound Preparation Ulcer Cleansing: Rinsed/Irrigated with Saline Topical Anesthetic Applied: Other: lidocaine 4%, Treatment Notes Wound #1 (Right Amputation Site - Below Knee) 1. Cleansed with: Clean wound with Normal Saline 2. Anesthetic Topical Lidocaine 4% cream to wound bed prior to debridement 3. Peri-wound Care: Skin Prep 4. Dressing Applied: Prisma Ag 5. Secondary Dressing Applied Bordered Foam Dressing Dry Gauze Electronic Signature(s) Signed: 03/21/2017 4:41:59 PM By: Alric Quan Entered By: Alric Quan on 03/21/2017 08:56:06 Andrew Lyons, Andrew Lyons ([id number]) Andrew Lyons, Andrew Lyons ([id number]) -------------------------------------------------------------------------------- [place] Details Patient Name: Andrew Lyons Date of Service: 03/21/2017 8:45 AM Medical Record Number: [id number] Patient Account Number: [id number] Date of Birth/Sex: 1946-03-02 (71 y.o. Male) Treating RN: [name], [name] Primary Care Floretta Petro: [name], Delfino Lovett Other Clinician: Referring Jenisse Vullo: [name], [name] Treating Hakim Minniefield/Extender: [name] in Treatment: 10 Vital Signs Time Taken: 08:48 Temperature (F): 97.6 Height (in): 70 Pulse (bpm): 57 Weight (lbs): 266.5 Respiratory Rate (breaths/min):  18 Body Mass Index (BMI): 38.2 Blood Pressure (mmHg): 140/58 Reference Range: 80 - 120 mg / dl Electronic Signature(s) Signed: 03/21/2017 4:41:59 PM By: Alric Quan Entered By: Alric Quan on 03/21/2017 08:49:25

## 2017-03-22 NOTE — Progress Notes (Signed)
LUISANGEL, WAINRIGHT ([id number]) Visit Report for 03/21/2017 Chief Complaint Document Details [name] Date of Service: 03/21/2017 8:45 AM Patient Name: T. Patient Account Number: [id number] Medical Record Treating RN: [name] [id number] Number: Other Clinician: Date of Birth/Sex: [birth year] (71 y.o. 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 Electronic Signature(s) Signed: 03/21/2017 4:49:17 PM By: Linton Ham MD Entered By: Linton Ham on 03/21/2017 12:56:25 [name] ([id number]) -------------------------------------------------------------------------------- Debridement Details [name] Date of Service: 03/21/2017 8:45 AM Patient Name: T. Patient Account Number: [id number] Medical Record Treating RN: [name] [id number] Number: Other Clinician: Date of Birth/Sex: [birth year] (71 y.o. Male) Treating ROBSON, [place] Primary Care Provider: [name], Delfino Lovett Provider/Extender: G Referring Provider: [name] in Treatment: 10 Debridement Performed for Wound #1 Right Amputation Site - Below Knee Assessment: Performed By: Physician [name], MD Debridement: Debridement Pre-procedure Verification/Time Out Yes - 09:20 Taken: Start Time: 09:21 Pain Control: Lidocaine 4% Topical Solution Level: Skin/Subcutaneous Tissue Total Area Debrided (L x 0.2 (cm) x 0.2 (cm) = 0.04 (cm) W): Tissue and other Viable, Non-Viable, Exudate, Fibrin/Slough, Subcutaneous material debrided: Instrument: Curette Bleeding: Minimum Hemostasis Achieved: Pressure End Time: 09:24 Procedural Pain: 0 Post Procedural Pain: 0 Response to Treatment: Procedure was  tolerated well Post Debridement Measurements of Total Wound Length: (cm) 0.2 Width: (cm) 0.2 Depth: (cm) 0.3 Volume: (cm) 0.009 Character of Wound/Ulcer Post Requires Further Debridement Debridement: Post Procedure Diagnosis Same as Pre-procedure Electronic Signature(s) Signed: 03/21/2017 4:41:59 PM By: Alric Quan Signed: 03/21/2017 4:49:17 PM By: Linton Ham MD Entered By: Linton Ham on 03/21/2017 12:56:17 MONTE, ZINNI ([id number], OLAZABAL ([id number]) -------------------------------------------------------------------------------- HPI Details [name] Date of Service: 03/21/2017 8:45 AM Patient Name: T. Patient Account Number: [id number] Medical Record Treating RN: [name] [id number] Number: Other Clinician: Date of Birth/Sex: 1946/04/10 (72 y.o. Male) Treating Linton Ham Primary Care Provider: [name] Provider/Extender: G Referring Provider: [name] in Treatment: 10 History of Present Illness HPI Description: 01/04/17; this is a 71 year old diabetic man who has a remote history of a traumatic lower extremity damage at age 69 requiring an amputation. He tells me spent 2 years walking on crutches then ultimately has been walking on prosthesis without any trouble since then. Several months ago he had a new prosthesis and developed an open area in the right stump in December. He saw podiatry Dr. Prudence Davidson who noted that this was really out of his practice jurisdiction and referred him here. He has been using mupirocin. He has continued to walk on the prosthesis. The prosthesis itself as been adjusted by the prostatitis. He does not have a known arterial issue. He tells me he probably has diabetic neuropathy. He had a wound that took a long time to heal surrounding the actual amputation itself however is not had more recent wounds. 01/10/17 Culture from last week grew pseudomonas. change AB to cefdinir  yesterday from doxy. he is not using his prosthesis which I emphasized although he is having it adjusted next week 01/17/17; he is completing his antibiotics in the next day or 2. We've been using silver alginate. His prosthesis is away being readjusted. He had an x-ray of the underlying bone here apparently at his podiatrist's office although I'll need to have a look at that  and we can't find the result then he may need this re-x-rayed. 01/24/17; he has completed his antibiotics. Change to Silver collagen last week. No real change in the wound this week. 01/31/17 repeat culture I did last week showed a few Pseudomonas. This only has intermediate sensitivity to quinolones I therefore put him back on a week of Cefdinir. X-ray of the stump did not show any bone destruction. We are attempting to get vascular studies done through Dr.Arida's office on May 23. 02/07/17; arterial studies are booked for May 23 no major change. He has completed his antibiotics 02/14/17 arterial studies on Thursday 5/23. using collagen 02/21/17; the patient had his arterial studies. Both his ABIs were good at 1.0 on the right and 1.1 on the left. His abdominal and iliac arteries revealed atherosclerosis without focal stenosis duplex imaging of the right lower leg revealed heterogeneous plaque and biphasic Doppler waveforms from the right CFA to the popliteal artery. The right distal SFA and tibial peroneal trunk velocities were elevated. 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 The proximal right ATA and PTA were patent above the amputation level. We have been using Silver collagen with some improvement in the wound dimensions 02/28/17; we went ahead and did TCOM measurements on this patient. One of the leads was nonfunctional however on the right leg both the proximal and periwound leads registered greater than 70 mmHg oxygen tension. Interestingly the comparison lead on the left leg/relative wound level was in  the mid 40s. This allays any fears I had about contributing wound ischemia. In the meantime is measurements today were 0.2 x 0.4 x 0.7 the wound is now a small divot most of it appears to be epithelialized with only a small open area at that depth. There is no evidence of surrounding AUTHER, LYERLY T. ([id number]) infection 03/07/17; continued improvement down to 0.3 cm in depth. This is a very tiny circular opening. No drainage no tenderness. Looks like it is been progressing towards closure. He has been using Silver collagen although I think this is too small to use this as a primary dressing. 03/14/17; still 0.3 cm in depth tiny circular wound. No drainage or tenderness. Used Iodosorb ointment starting last week 03/21/17; still the depth and this wound. Using a #3 curet I remove necrotic material from the wound base. There is a divot here. Not making enough improvement on the Iodosorb, I change back to silver collagen this week Electronic Signature(s) Signed: 03/21/2017 4:49:17 PM By: Linton Ham MD Entered By: Linton Ham on 03/21/2017 12:57:05 [name] ([id number]) -------------------------------------------------------------------------------- Physical Exam Details [name] Date of Service: 03/21/2017 8:45 AM Patient Name: T. Patient Account Number: [id number] Medical Record Treating RN: [name] [id number] Number: Other Clinician: Date of Birth/Sex: 04-02-1946 (71 y.o. Male) Treating Linton Ham Primary Care Provider: [name] Provider/Extender: G Referring Provider: [name], [name] Weeks in Treatment: 10 Constitutional Sitting or standing Blood Pressure is within target range for patient.. Pulse regular and within target range for patient.Marland Kitchen Respirations regular, non-labored and within target range.. Temperature is normal and within the target range for the patient.Marland Kitchen appears in no distress. Cardiovascular His popliteal pulses  still not palpable although his arterial studies were quite good. Notes Wound exam; there is still a Devitt here 0.3 cm in depth. I don't think we are making too much progress with the Iodosorb ointment and I'm going to change back to moisten silver collagen Electronic Signature(s) Signed: 03/21/2017 4:49:17 PM By: Linton Ham MD  Entered By: Linton Ham on 03/21/2017 12:58:18 [name] ([id number]) -------------------------------------------------------------------------------- Physician Orders Details [name] Date of Service: 03/21/2017 8:45 AM Patient Name: T. Patient Account Number: [id number] Medical Record Treating RN: [name] [id number] Number: Other Clinician: Date of Birth/Sex: [birth year] (71 y.o. Male) Treating Linton Ham Primary Care Provider: [name] Provider/Extender: G Referring Provider: [name] in Treatment: 10 Verbal / Phone Orders: Yes Clinician: [name], [name] Read Back and Verified: Yes Diagnosis Coding Wound Cleansing Wound #1 Right Amputation Site - Below Knee o Clean wound with Normal Saline. o Cleanse wound with mild soap and water Anesthetic Wound #1 Right Amputation Site - Below Knee o Topical Lidocaine 4% cream applied to wound bed prior to debridement - in clinic only Skin Barriers/Peri-Wound Care Wound #1 Right Amputation Site - Below Knee o Skin Prep Primary Wound Dressing Wound #1 Right Amputation Site - Below Knee o Prisma Ag Secondary Dressing Wound #1 Right Amputation Site - Below Knee o Dry Gauze o Boardered Foam Dressing Dressing Change Frequency Wound #1 Right Amputation Site - Below Knee o Change dressing every other day. Follow-up Appointments Wound #1 Right Amputation Site - Below Knee o Return Appointment in 1 week. Edema Control Wound #1 Right Amputation Site - Below Knee o Elevate legs to the level of the heart and pump ankles as often as  possible Adamski, Naman T. ([id number]) Off-Loading o Other: - do not wear your prosthesis Additional Orders / Instructions Wound #1 Right Amputation Site - Below Knee o Increase protein intake. Medications-please add to medication list. Wound #1 Right Amputation Site - Below Knee o Other: - vitamin C, zinc, MVI Electronic Signature(s) Signed: 03/21/2017 4:41:59 PM By: Alric Quan Signed: 03/21/2017 4:49:17 PM By: Linton Ham MD Entered By: Alric Quan on 03/21/2017 09:25:11 STOY, FENN ([id number]) -------------------------------------------------------------------------------- Problem List Details [name] Date of Service: 03/21/2017 8:45 AM Patient Name: T. Patient Account Number: [id number] Medical Record Treating RN: [name] [id number] Number: Other Clinician: Date of Birth/Sex: 04-27-1946 (71 y.o. 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 Patient Account Number: [id number] Medical Record Treating RN: [name] [id number] Number: Other  Clinician: Date of Birth/Sex: 31-Oct-1945 (71 y.o. 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 History of Present Illness (HPI) 01/04/17; this is a 71 year old diabetic man who has a remote history of a traumatic lower extremity damage at age 37 requiring an amputation. He tells me spent 2 years walking on crutches then ultimately has been walking on prosthesis without any trouble since then. Several months ago he had a new prosthesis and developed an open area in the right stump  in December. He saw podiatry Dr. Prudence Davidson who noted that this was really out of his practice jurisdiction and referred him here. He has been using mupirocin. He has continued to walk on the prosthesis. The prosthesis itself as been adjusted by the prostatitis. He does not have a known arterial issue. He tells me he probably has diabetic neuropathy. He had a wound that took a long time to heal surrounding the actual amputation itself however is not had more recent wounds. 01/10/17 Culture from last week grew pseudomonas. change AB to cefdinir yesterday from doxy. he is not using his prosthesis which I emphasized although he is having it adjusted next week 01/17/17; he is completing his antibiotics in the next day or 2. We've been using silver alginate. His prosthesis is away being readjusted. He had an x-ray of the underlying bone here apparently at his podiatrist's office although I'll need to have a look at that and we can't find the result then he may need this re-x-rayed. 01/24/17; he has completed his antibiotics. Change to Silver collagen last week. No real change in the wound this week. 01/31/17 repeat culture I did last week showed a few  Pseudomonas. This only has intermediate sensitivity to quinolones I therefore put him back on a week of Cefdinir. X-ray of the stump did not show any bone destruction. We are attempting to get vascular studies done through Dr.Arida's office on May 23. 02/07/17; arterial studies are booked for May 23 no major change. He has completed his antibiotics 02/14/17 arterial studies on Thursday 5/23. using collagen 02/21/17; the patient had his arterial studies. Both his ABIs were good at 1.0 on the right and 1.1 on the left. His abdominal and iliac arteries revealed atherosclerosis without focal stenosis duplex imaging of the right lower leg revealed heterogeneous plaque and biphasic Doppler waveforms from the right CFA to the popliteal artery. The right distal SFA and tibial peroneal trunk velocities were elevated. 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 The proximal right ATA and PTA were patent above the amputation level. We have been using Silver collagen with some improvement in the wound dimensions 02/28/17; we went ahead and did TCOM measurements on this patient. One of the leads was nonfunctional however on the right leg both the proximal and periwound leads registered greater than 70 mmHg oxygen Mussell, Nazar T. ([id number]) tension. Interestingly the comparison lead on the left leg/relative wound level was in the mid 40s. This allays any fears I had about contributing wound ischemia. In the meantime is measurements today were 0.2 x 0.4 x 0.7 the wound is now a small divot most of it appears to be epithelialized with only a small open area at that depth. There is no evidence of surrounding infection 03/07/17; continued improvement down to 0.3 cm in depth. This is a very tiny circular opening. No drainage no tenderness. Looks like it is been progressing towards closure. He has been using Silver collagen although I think this is too small to use this as a primary  dressing. 03/14/17; still 0.3 cm in depth tiny circular wound. No drainage or tenderness. Used Iodosorb ointment starting last week 03/21/17; still the depth and this wound. Using a #3 curet I remove necrotic material from the wound base. There is a divot here. Not making enough improvement on the Iodosorb, I change back to silver collagen this week Objective Constitutional Sitting or standing Blood Pressure is within target range for patient.. Pulse regular and within target range  for patient.Marland Kitchen Respirations regular, non-labored and within target range.. Temperature is normal and within the target range for the patient.Marland Kitchen appears in no distress. Vitals Time Taken: 8:48 AM, Height: 70 in, Weight: 266.5 lbs, BMI: 38.2, Temperature: 97.6 F, Pulse: 57 bpm, Respiratory Rate: 18 breaths/min, Blood Pressure: 140/58 mmHg. Cardiovascular His popliteal pulses still not palpable although his arterial studies were quite good. General Notes: Wound exam; there is still a Devitt here 0.3 cm in depth. I don't think we are making too much progress with the Iodosorb ointment and I'm going to change back to moisten silver collagen Integumentary (Hair, Skin) Wound #1 status is Open. Original cause of wound was Gradually Appeared. 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 The wound measures 0.2cm length x 0.2cm width x 0.3cm depth; 0.031cm^2 area and 0.009cm^3 volume. There is no tunneling or undermining noted. There is a large amount of serous drainage noted. The wound margin is distinct with the outline attached to the wound base. There is large (67-100%) pink, pale granulation within the wound bed. There is a small (1-33%) amount of necrotic tissue within the wound bed including Adherent Slough. The periwound skin appearance exhibited: Callus, Scarring, Dry/Scaly. The periwound skin appearance did not exhibit: Crepitus, Excoriation, Induration, Rash, Maceration, Atrophie Blanche,  Cyanosis, Ecchymosis, Hemosiderin Staining, Mottled, Beckles, Maya T. ([id number]) Pallor, Rubor, Erythema. Periwound temperature was noted as No Abnormality. The periwound has tenderness on palpation. 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 There was a Skin/Subcutaneous Tissue Debridement ([id number]) debridement with total area of 0.04 sq cm performed by [name], MD. with the following instrument(s): Curette to remove Viable and Non-Viable tissue/material including Exudate, Fibrin/Slough, and Subcutaneous after achieving pain control using Lidocaine 4% Topical Solution. A time out was conducted at 09:20, prior to the start of the procedure. A Minimum amount of bleeding was controlled with Pressure. The procedure was tolerated well with a pain level of 0 throughout and a pain level of 0 following the procedure. Post Debridement Measurements: 0.2cm length x 0.2cm width x 0.3cm depth; 0.009cm^3 volume. Character of Wound/Ulcer Post Debridement requires further debridement. Post procedure Diagnosis Wound #1: Same as Pre-Procedure Plan Wound Cleansing: Wound #1 Right Amputation Site - Below Knee: Clean wound with Normal Saline. Cleanse wound with mild soap and water Anesthetic: Wound #1 Right Amputation Site - Below Knee: OLE, LAFON ([id number]) Topical Lidocaine 4% cream applied to wound bed prior to debridement - in clinic only Skin Barriers/Peri-Wound Care: Wound #1 Right Amputation Site - Below Knee: Skin Prep Primary Wound Dressing: Wound #1 Right Amputation  Site - Below Knee: Prisma Ag Secondary Dressing: Wound #1 Right Amputation Site - Below Knee: Dry Gauze Boardered Foam Dressing Dressing Change Frequency: Wound #1 Right Amputation Site - Below Knee: Change dressing every other day. Follow-up Appointments: Wound #1 Right Amputation Site - Below Knee: Return Appointment in 1 week. Edema Control: Wound #1 Right Amputation Site - Below Knee: Elevate legs to the level of the heart and pump ankles as often as possible Off-Loading: Other: - do not wear your prosthesis Additional Orders / Instructions: Wound #  1 Right Amputation Site - Below Knee: Increase protein intake. Medications-please add to medication list.: Wound #1 Right Amputation Site - Below Knee: Other: - vitamin C, zinc, MVI #1 change primary dressing to Prisma, border foam every second day Electronic Signature(s) Signed: 03/21/2017 4:49:17 PM By: Linton Ham MD Entered By: Linton Ham on 03/21/2017 12:58:51 [name] ([id number]) -------------------------------------------------------------------------------- SuperBill Details [name] Date of Service: 03/21/2017 Patient Name: T. Patient Account Number: [id number] Medical Record Treating RN: [name] [id number] Number: Other Clinician: Date of Birth/Sex: 1946/05/15 (71 y.o. 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, right lower leg, S88.111D subsequent encounter L03.115 Cellulitis of right lower limb Facility Procedures CPT4 Code Description: [id number] - DEB SUBQ TISSUE 20 SQ CM/< ICD-10 Description Diagnosis E11.622 Type 2  diabetes mellitus with other skin ulcer L97.313 Non-pressure chronic ulcer of right ankle with necr Modifier: osis of musc Quantity: 1 le Physician Procedures CPT4 Code Description: 6060045 [id number] - WC PHYS SUBQ TISS 20 SQ CM ICD-10 Description Diagnosis E11.622 Type 2 diabetes mellitus with other skin ulcer L97.313 Non-pressure chronic ulcer of right ankle with necr Modifier: osis of musc Quantity: 1 le Electronic Signature(s) Signed: 03/21/2017 4:49:17 PM By: Linton Ham MD Entered By: Linton Ham on 03/21/2017 13:20:17

## 2017-03-28 ENCOUNTER — Encounter: Payer: PPO | Attending: Internal Medicine | Admitting: Internal Medicine

## 2017-03-28 DIAGNOSIS — E11622 Type 2 diabetes mellitus with other skin ulcer: Secondary | ICD-10-CM | POA: Diagnosis not present

## 2017-03-28 DIAGNOSIS — I252 Old myocardial infarction: Secondary | ICD-10-CM | POA: Insufficient documentation

## 2017-03-28 DIAGNOSIS — G4730 Sleep apnea, unspecified: Secondary | ICD-10-CM | POA: Diagnosis not present

## 2017-03-28 DIAGNOSIS — S81801A Unspecified open wound, right lower leg, initial encounter: Secondary | ICD-10-CM | POA: Diagnosis not present

## 2017-03-28 DIAGNOSIS — L03115 Cellulitis of right lower limb: Secondary | ICD-10-CM | POA: Insufficient documentation

## 2017-03-28 DIAGNOSIS — X58XXXD Exposure to other specified factors, subsequent encounter: Secondary | ICD-10-CM | POA: Insufficient documentation

## 2017-03-28 DIAGNOSIS — J449 Chronic obstructive pulmonary disease, unspecified: Secondary | ICD-10-CM | POA: Insufficient documentation

## 2017-03-28 DIAGNOSIS — T8189XA Other complications of procedures, not elsewhere classified, initial encounter: Secondary | ICD-10-CM | POA: Diagnosis not present

## 2017-03-28 DIAGNOSIS — L97313 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 of muscle: Secondary | ICD-10-CM | POA: Diagnosis not present

## 2017-03-28 DIAGNOSIS — E1140 Type 2 diabetes mellitus with diabetic neuropathy, unspecified: Secondary | ICD-10-CM | POA: Diagnosis not present

## 2017-03-28 DIAGNOSIS — I959 Hypotension, unspecified: Secondary | ICD-10-CM | POA: Insufficient documentation

## 2017-03-28 DIAGNOSIS — S88111D Complete traumatic amputation at level between knee and ankle, right lower leg, subsequent encounter: Secondary | ICD-10-CM | POA: Diagnosis not present

## 2017-03-30 ENCOUNTER — Encounter (INDEPENDENT_AMBULATORY_CARE_PROVIDER_SITE_OTHER): Payer: PPO | Admitting: Vascular Surgery

## 2017-03-30 ENCOUNTER — Encounter (INDEPENDENT_AMBULATORY_CARE_PROVIDER_SITE_OTHER): Payer: PPO

## 2017-04-03 NOTE — Progress Notes (Signed)
MYCHAL, DECARLO ([id number]) Visit Report for 03/28/2017 Arrival Information Details Patient Name: JAVIOUS, HALLISEY Date of Service: 03/28/2017 9:00 AM Medical Record Number: [id number] Patient Account Number: [id number] Date of Birth/Sex: [birth year] (71 y.o. Male) Treating RN: [name], [name] Primary Care Jacques Willingham: [name], Delfino Lovett Other Clinician: Referring Janeil Schexnayder: [name], [name] Treating Vanilla Heatherington/Extender: [name] in Treatment: 11 Visit Information History Since Last Visit All ordered tests and consults were completed: No Patient Arrived: Other Added or deleted any medications: No Arrival Time: 08:56 Any new allergies or adverse reactions: No Accompanied By: self Had a fall or experienced change in No Transfer Assistance: EasyPivot activities of daily living that may affect Patient Lift risk of falls: Patient Identification Verified: Yes Signs or symptoms of abuse/neglect since last No Secondary Verification Process Yes visito Completed: Hospitalized since last visit: No Patient Requires Transmission- No Has Dressing in Place as Prescribed: Yes Based Precautions: Pain Present Now: No Patient Has Alerts: Yes Patient Alerts: DM II Electronic Signature(s) Signed: 03/31/2017 4:17:34 PM By: Alric Quan Entered By: Alric Quan on 03/28/2017 08:57:14 [name] ([id number]) -------------------------------------------------------------------------------- Clinic Level of Care Assessment Details Patient Name: [name] Date of Service: 03/28/2017 9:00 AM Medical Record Number: [id number] Patient Account Number: [id number] Date of Birth/Sex: 1946-08-22 (71 y.o. Male) Treating RN: [name], [name] Primary Care Daleigh Pollinger: [name], Delfino Lovett Other Clinician: Referring Rorey Bisson: [name], [name] Treating [place] Doig/Extender: [name] in Treatment: 11 Clinic Level of Care Assessment Items TOOL 4 Quantity  Score X - Use when only an EandM is performed on FOLLOW-UP visit 1 0 ASSESSMENTS - Nursing Assessment / Reassessment X - Reassessment of Co-morbidities (includes updates in patient status) 1 10 X - Reassessment of Adherence to Treatment Plan 1 5 ASSESSMENTS - Wound and Skin Assessment / Reassessment X - Simple Wound Assessment / Reassessment - one wound 1 5 []  - Complex Wound Assessment / Reassessment - multiple wounds 0 []  - Dermatologic / Skin Assessment (not related to wound area) 0 ASSESSMENTS - Focused Assessment []  - Circumferential Edema Measurements - multi extremities 0 []  - Nutritional Assessment / Counseling / Intervention 0 []  - Lower Extremity Assessment (monofilament, tuning fork, pulses) 0 []  - Peripheral Arterial Disease Assessment (using hand held doppler) 0 ASSESSMENTS - Ostomy and/or Continence Assessment and Care []  - Incontinence Assessment and Management 0 []  - Ostomy Care Assessment and Management (repouching, etc.) 0 PROCESS - Coordination of Care X - Simple Patient / Family Education for ongoing care 1 15 []  - Complex (extensive) Patient / Family Education for ongoing care 0 []  - Staff obtains Programmer, systems, Records, Test Results / Process Orders 0 []  - Staff telephones HHA, Nursing Homes / Clarify orders / etc 0 []  - Routine Transfer to another Facility (non-emergent condition) 0 ELDEAN, NANNA ([id number]) []  - Routine Hospital Admission (non-emergent condition) 0 []  - New Admissions / Biomedical engineer / Ordering NPWT, Apligraf, etc. 0 []  - Emergency Hospital Admission (emergent condition) 0 X - Simple Discharge Coordination 1 10 []  - Complex (extensive) Discharge Coordination 0 PROCESS - Special Needs []  - Pediatric / Minor Patient Management 0 []  - Isolation Patient Management 0 []  - Hearing / Language / Visual special needs 0 []  - Assessment of Community assistance (transportation, D/C planning, etc.) 0 []  - Additional assistance / Altered  mentation 0 []  - Support Surface(s) Assessment (bed, cushion, seat, etc.) 0 INTERVENTIONS - Wound Cleansing / Measurement X - Simple Wound Cleansing - one wound 1 5 []  -  Complex Wound Cleansing - multiple wounds 0 X - Wound Imaging (photographs - any number of wounds) 1 5 []  - Wound Tracing (instead of photographs) 0 X - Simple Wound Measurement - one wound 1 5 []  - Complex Wound Measurement - multiple wounds 0 INTERVENTIONS - Wound Dressings X - Small Wound Dressing one or multiple wounds 1 10 []  - Medium Wound Dressing one or multiple wounds 0 []  - Large Wound Dressing one or multiple wounds 0 X - Application of Medications - topical 1 5 []  - Application of Medications - injection 0 INTERVENTIONS - Miscellaneous []  - External ear exam 0 CLAYTEN, ALLCOCK. ([id number]) []  - Specimen Collection (cultures, biopsies, blood, body fluids, etc.) 0 []  - Specimen(s) / Culture(s) sent or taken to Lab for analysis 0 []  - Patient Transfer (multiple staff / Harrel Lemon Lift / Similar devices) 0 []  - Simple Staple / Suture removal (25 or less) 0 []  - Complex Staple / Suture removal (26 or more) 0 []  - Hypo / Hyperglycemic Management (close monitor of Blood Glucose) 0 []  - Ankle / Brachial Index (ABI) - do not check if billed separately 0 X - Vital Signs 1 5 Has the patient been seen at the hospital within the last three years: Yes Total Score: 80 Level Of Care: New/Established - Level 3 Electronic Signature(s) Signed: 03/31/2017 4:17:34 PM By: Alric Quan Entered By: Alric Quan on 03/28/2017 10:08:58 [name] ([id number]) -------------------------------------------------------------------------------- Encounter Discharge Information Details Patient Name: [name] Date of Service: 03/28/2017 9:00 AM Medical Record Number: [id number] Patient Account Number: [id number] Date of Birth/Sex: January 20, 1946 (71 y.o. Male) Treating RN: [name], [name] Primary Care Hanford Lust:  [name], Delfino Lovett Other Clinician: Referring Rolando Whitby: [name], [name] Treating Vincent Ehrler/Extender: [name] in Treatment: 11 Encounter Discharge Information Items Discharge Pain Level: 0 Discharge Condition: Stable Ambulatory Status: Walker Discharge Destination: Home Transportation: Private Auto Accompanied By: self Schedule Follow-up Appointment: Yes Medication Reconciliation completed No and provided to Patient/Care Kaidyn Javid: Provided on Clinical Summary of Care: 03/28/2017 Form Type Recipient Paper Patient KA Electronic Signature(s) Signed: 03/28/2017 9:28:20 AM By: Ruthine Dose Entered By: Ruthine Dose on 03/28/2017 09:28:20 [name] ([id number]) -------------------------------------------------------------------------------- Lower Extremity Assessment Details Patient Name: [name] Date of Service: 03/28/2017 9:00 AM Medical Record Number: [id number] Patient Account Number: [id number] Date of Birth/Sex: [birth year] (71 y.o. Male) Treating RN: [name], [name] Primary Care Demetrick Eichenberger: [name], Delfino Lovett Other Clinician: Referring Dakarri Kessinger: [name], [name] Treating Arelie Kuzel/Extender: [name] in Treatment: 11 Vascular Assessment Pulses: Posterior Tibial Extremity colors, hair growth, and conditions: Extremity Color: [Right:Normal] Temperature of Extremity: [Right:Warm] Capillary Refill: [Right:< 3 seconds] Electronic Signature(s) Signed: 03/31/2017 4:17:34 PM By: Alric Quan Entered By: Alric Quan on 03/28/2017 09:11:31 [name] ([id number]) -------------------------------------------------------------------------------- Multi Wound Chart Details Patient Name: [name] Date of Service: 03/28/2017 9:00 AM Medical Record Number: [id number] Patient Account Number: [id number] Date of Birth/Sex: Jul 03, 1946 (71 y.o. Male) Treating RN: [name], [name] Primary Care Zakarie Sturdivant: [name],  Delfino Lovett Other Clinician: Referring Ilai Hiller: [name], [name] Treating Takeshi Teasdale/Extender: [name] in Treatment: 11 Vital Signs Height(in): 70 Pulse(bpm): 61 Weight(lbs): 266.5 Blood Pressure 137/55 (mmHg): Body Mass Index(BMI): 38 Temperature(F): 97.7 Respiratory Rate 18 (breaths/min): Photos: [1:No Photos] [N/A:N/A] Wound Location: [1:Right Amputation Site - Below Knee] [N/A:N/A] Wounding Event: [1:Gradually Appeared] [N/A:N/A] Primary Etiology: [1:Atypical] [N/A:N/A] Comorbid History: [1:Cataracts, Chronic Obstructive Pulmonary Disease (COPD), Sleep Apnea, Hypotension, Myocardial Infarction, Type II Diabetes, Osteoarthritis, Neuropathy] [N/A:N/A] Date Acquired: [1:08/26/2016] [N/A:N/A] Weeks  of Treatment: [1:11] [N/A:N/A] Wound Status: [1:Open] [N/A:N/A] Measurements L x W x D 0.2x0.2x0.2 [N/A:N/A] (cm) Area (cm) : [1:0.031] [N/A:N/A] Volume (cm) : [1:0.006] [N/A:N/A] % Reduction in Area: [1:60.80%] [N/A:N/A] % Reduction in Volume: 91.50% [N/A:N/A] Classification: [1:Full Thickness With Exposed Support Structures] [N/A:N/A] HBO Classification: [1:Grade 1] [N/A:N/A] Exudate Amount: [1:Large] [N/A:N/A] Exudate Type: [1:Serous] [N/A:N/A] Exudate Color: [1:amber] [N/A:N/A] Wound Margin: [1:Distinct, outline attached N/A] Granulation Amount: [1:Large (67-100%)] [N/A:N/A] Granulation Quality: Pink, Pale N/A N/A Necrotic Amount: Small (1-33%) N/A N/A Exposed Structures: Fascia: No N/A N/A Fat Layer (Subcutaneous Tissue) Exposed: No Tendon: No Muscle: No Joint: No Bone: No Epithelialization: Medium (34-66%) N/A N/A Periwound Skin Texture: Callus: Yes N/A N/A Scarring: Yes Excoriation: No Induration: No Crepitus: No Rash: No Periwound Skin Dry/Scaly: Yes N/A N/A Moisture: Maceration: No Periwound Skin Color: Atrophie Blanche: No N/A N/A Cyanosis: No Ecchymosis: No Erythema: No Hemosiderin Staining: No Mottled: No Pallor: No Rubor:  No Temperature: No Abnormality N/A N/A Tenderness on Yes N/A N/A Palpation: Wound Preparation: Ulcer Cleansing: N/A N/A Rinsed/Irrigated with Saline Topical Anesthetic Applied: Other: lidocaine 4% Treatment Notes Wound #1 (Right Amputation Site - Below Knee) 1. Cleansed with: Clean wound with Normal Saline 2. Anesthetic Topical Lidocaine 4% cream to wound bed prior to debridement 3. Peri-wound Care: Skin Prep 4. Dressing Applied: Prisma Ag 5. Secondary Dressing Applied Bordered Foam Dressing URIE, LOUGHNER ([id number]) Dry Gauze Electronic Signature(s) Signed: 03/28/2017 5:35:48 PM By: Linton Ham MD Entered By: Linton Ham on 03/28/2017 09:35:26 [name] ([id number]) -------------------------------------------------------------------------------- Multi-Disciplinary Care Plan Details Patient Name: [name] Date of Service: 03/28/2017 9:00 AM Medical Record Number: [id number] Patient Account Number: [id number] Date of Birth/Sex: Dec 18, 1945 (71 y.o. Male) Treating RN: [name], [name] Primary Care Amillia Biffle: [name], Delfino Lovett Other Clinician: Referring Dezi Brauner: [name], [name] Treating Aleanna Menge/Extender: [name] in Treatment: 11 Active Inactive ` Abuse / Safety / Falls / Self Care Management Nursing Diagnoses: Potential for falls Goals: Patient will remain injury free Date Initiated: 01/04/2017 Target Resolution Date: 04/01/2017 Goal Status: Active Interventions: Assess fall risk on admission and as needed Assess self care needs on admission and as needed Notes: ` Orientation to the Wound Care Program Nursing Diagnoses: Knowledge deficit related to the wound healing center program Goals: Patient/caregiver will verbalize understanding of the [facility] Program Date Initiated: 01/04/2017 Target Resolution Date: 01/28/2017 Goal Status: Active Interventions: Provide education on orientation to the wound  center Notes: ` Wound/Skin Impairment Nursing Diagnoses: Impaired tissue integrity TRAVANTE, KNEE ([id number]) Knowledge deficit related to ulceration/compromised skin integrity Goals: Ulcer/skin breakdown will have a volume reduction of 80% by week 12 Date Initiated: 01/04/2017 Target Resolution Date: 04/22/2017 Goal Status: Active Interventions: Assess patient/caregiver ability to perform ulcer/skin care regimen upon admission and as needed Assess ulceration(s) every visit Notes: Electronic Signature(s) Signed: 03/31/2017 4:17:34 PM By: Alric Quan Entered By: Alric Quan on 03/28/2017 09:11:36 [name] ([id number]) -------------------------------------------------------------------------------- Pain Assessment Details Patient Name: [name] Date of Service: 03/28/2017 9:00 AM Medical Record Number: [id number] Patient Account Number: [id number] Date of Birth/Sex: Feb 05, 1946 (71 y.o. Male) Treating RN: [name], [name] Primary Care Donne Robillard: [name], Delfino Lovett Other Clinician: Referring Herschel Fleagle: [name], [name] Treating Elyssa Pendelton/Extender: [name] in Treatment: 11 Active Problems Location of Pain Severity and Description of Pain Patient Has Paino No Site Locations With Dressing Change: No Pain Management and Medication Current Pain Management: Electronic Signature(s) Signed: 03/31/2017 4:17:34 PM By: Alric Quan Entered By: Alric Quan on 03/28/2017  08:57:20 KEIGAN, TAFOYA ([id number]) -------------------------------------------------------------------------------- Patient/Caregiver Education Details [name] Date of Service: 03/28/2017 9:00 AM Patient Name: T. Patient Account Number: [id number] Medical Record Treating RN: [name] [id number] Number: Other Clinician: Date of Birth/Gender: 03-28-1946 (71 y.o. Male) Treating Linton Ham Primary Care Physician: [name], Delfino Lovett  Physician/Extender: G Referring Physician: [name] in Treatment: 11 Education Assessment Education Provided To: Patient Education Topics Provided Wound/Skin Impairment: Handouts: Other: change dressing as ordered Methods: Demonstration, Explain/Verbal Responses: State content correctly Electronic Signature(s) Signed: 03/31/2017 4:17:34 PM By: Alric Quan Entered By: Alric Quan on 03/28/2017 09:12:15 [name] ([id number]) -------------------------------------------------------------------------------- Wound Assessment Details Patient Name: [name] Date of Service: 03/28/2017 9:00 AM Medical Record Number: [id number] Patient Account Number: [id number] Date of Birth/Sex: [birth year] (71 y.o. Male) Treating RN: [name], [name] Primary Care Carrick Rijos: [name], Delfino Lovett Other Clinician: Referring Makailah Slavick: [name], [name] Treating Gemayel Mascio/Extender: [name] in Treatment: 11 Wound Status Wound Number: 1 Primary Atypical Etiology: Wound Location: Right Amputation Site - Below Knee Wound Open Status: Wounding Event: Gradually Appeared Comorbid Cataracts, Chronic Obstructive Date Acquired: 08/26/2016 History: Pulmonary Disease (COPD), Sleep Weeks Of Treatment: 11 Apnea, Hypotension, Myocardial Clustered Wound: No Infarction, Type II Diabetes, Osteoarthritis, Neuropathy Photos Photo Uploaded By: Alric Quan on 03/28/2017 10:50:48 Wound Measurements Length: (cm) 0.2 Width: (cm) 0.2 Depth: (cm) 0.2 Area: (cm) 0.031 Volume: (cm) 0.006 % Reduction in Area: 60.8% % Reduction in Volume: 91.5% Epithelialization: Medium (34-66%) Tunneling: No Undermining: No Wound Description Full Thickness With Exposed Classification: Support Structures Diabetic Severity Grade 1 (Wagner): Wound Margin: Distinct, outline attached Exudate Amount: Large Exudate Type: Serous Exudate Color: amber RONSON, HAGINS  ([id number]) Foul Odor After Cleansing: No Slough/Fibrino Yes Wound Bed Granulation Amount: Large (67-100%) Exposed Structure Granulation Quality: Pink, Pale Fascia Exposed: No Necrotic Amount: Small (1-33%) Fat Layer (Subcutaneous Tissue) Exposed: No Necrotic Quality: Adherent Slough Tendon Exposed: No Muscle Exposed: No Joint Exposed: No Bone Exposed: No Periwound Skin Texture Texture Color No Abnormalities Noted: No No Abnormalities Noted: No Callus: Yes Atrophie Blanche: No Crepitus: No Cyanosis: No Excoriation: No Ecchymosis: No Induration: No Erythema: No Rash: No Hemosiderin Staining: No Scarring: Yes Mottled: No Pallor: No Moisture Rubor: No No Abnormalities Noted: No Dry / Scaly: Yes Temperature / Pain Maceration: No Temperature: No Abnormality Tenderness on Palpation: Yes Wound Preparation Ulcer Cleansing: Rinsed/Irrigated with Saline Topical Anesthetic Applied: Other: lidocaine 4%, Treatment Notes Wound #1 (Right Amputation Site - Below Knee) 1. Cleansed with: Clean wound with Normal Saline 2. Anesthetic Topical Lidocaine 4% cream to wound bed prior to debridement 3. Peri-wound Care: Skin Prep 4. Dressing Applied: Prisma Ag 5. Secondary Dressing Applied Bordered Foam Dressing Dry Gauze Electronic Signature(s) Signed: 03/31/2017 4:17:34 PM By: Alric Quan Entered By: Alric Quan on 03/28/2017 09:02:55 SAVAS, ELVIN ([id number]) SHERRON, MUMMERT ([id number]) -------------------------------------------------------------------------------- [place] Details Patient Name: [name] Date of Service: 03/28/2017 9:00 AM Medical Record Number: [id number] Patient Account Number: [id number] Date of Birth/Sex: [birth year] (71 y.o. Male) Treating RN: [name], [name] Primary Care Romain Erion: [name], Delfino Lovett Other Clinician: Referring Letty Salvi: [name], [name] Treating Penny Frisbie/Extender: [name] in Treatment:  11 Vital Signs Time Taken: 08:57 Temperature (F): 97.7 Height (in): 70 Pulse (bpm): 61 Weight (lbs): 266.5 Respiratory Rate (breaths/min): 18 Body Mass Index (BMI): 38.2 Blood Pressure (mmHg): 137/55 Reference Range: 80 - 120 mg / dl Electronic Signature(s) Signed: 03/31/2017 4:17:34 PM By: Alric Quan Entered By: Alric Quan on 03/28/2017 08:59:31

## 2017-04-03 NOTE — Progress Notes (Signed)
Andrew Lyons ([id number]) Visit Report for 03/28/2017 Chief Complaint Document Details Andrew Lyons Date of Service: 03/28/2017 9:00 AM Patient Name: T. Patient Account Number: [id number] Medical Record Treating RN: Andrew Lyons [id number] Number: Other Clinician: Date of Birth/Sex: 18-Oct-1945 (71 y.o. 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 Electronic Signature(s) Signed: 03/28/2017 5:35:48 PM By: Andrew Ham MD Entered By: Andrew Lyons on 03/28/2017 09:35:37 Andrew Lyons ([id number]) -------------------------------------------------------------------------------- HPI Details Andrew Lyons Date of Service: 03/28/2017 9:00 AM Patient Name: T. Patient Account Number: [id number] Medical Record Treating RN: Andrew Lyons [id number] Number: Other Clinician: Date of Birth/Sex: 1946-02-25 (71 y.o. Male) Treating Andrew Lyons Primary Care Provider: Wilhemena Lyons Provider/Extender: G Referring Provider: Quintella Lyons in Treatment: 11 History of Present Illness HPI Description: 01/04/17; this is a 71 year old diabetic man who has a remote history of a traumatic lower extremity damage at age 33 requiring an amputation. He tells me spent 2 years walking on crutches then ultimately has been walking on prosthesis without any trouble since then. Several months ago he had a new prosthesis and developed an open area in the right stump in December. He saw podiatry Dr. Prudence Lyons who noted that this was really out of his practice jurisdiction and referred him here. He has been using mupirocin. He has continued to walk on the prosthesis. The prosthesis itself as been adjusted by  the prostatitis. He does not have a known arterial issue. He tells me he probably has diabetic neuropathy. He had a wound that took a long time to heal surrounding the actual amputation itself however is not had more recent wounds. 01/10/17 Culture from last week grew pseudomonas. change AB to cefdinir yesterday from doxy. he is not using his prosthesis which I emphasized although he is having it adjusted next week 01/17/17; he is completing his antibiotics in the next day or 2. We've been using silver alginate. His prosthesis is away being readjusted. He had an x-ray of the underlying bone here apparently at his podiatrist's office although I'll need to have a look at that and we can't find the result then he may need this re-x-rayed. 01/24/17; he has completed his antibiotics. Change to Silver collagen last week. No real change in the wound this week. 01/31/17 repeat culture I did last week showed a few Pseudomonas. This only has intermediate sensitivity to quinolones I therefore put him back on a week of Cefdinir. X-ray of the stump did not show any bone destruction. We are attempting to get vascular studies done through Andrew Lyons's office on May 23. 02/07/17; arterial studies are booked for May 23 no major change. He has completed his antibiotics 02/14/17 arterial studies on Thursday 5/23. using collagen 02/21/17; the patient had his arterial studies. Both his ABIs were good at 1.0 on the right and 1.1 on the left. His abdominal and iliac arteries revealed atherosclerosis without focal stenosis duplex imaging of the right lower leg revealed heterogeneous plaque and biphasic Doppler waveforms from the right CFA to the popliteal artery. The right distal SFA and tibial peroneal trunk velocities were elevated. 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 The proximal right ATA and PTA were patent above the amputation level. We have been using Silver collagen with some improvement in the  wound dimensions  02/28/17; we went ahead and did TCOM measurements on this patient. One of the leads was nonfunctional however on the right leg both the proximal and periwound leads registered greater than 70 mmHg oxygen tension. Interestingly the comparison lead on the left leg/relative wound level was in the mid 40s. This allays any fears I had about contributing wound ischemia. In the meantime is measurements today were 0.2 x 0.4 x 0.7 the wound is now a small divot most of it appears to be epithelialized with only a small open area at that depth. There is no evidence of surrounding Andrew Lyons, Andrew T. ([id number]) infection 03/07/17; continued improvement down to 0.3 cm in depth. This is a very tiny circular opening. No drainage no tenderness. Looks like it is been progressing towards closure. He has been using Silver collagen although I think this is too small to use this as a primary dressing. 03/14/17; still 0.3 cm in depth tiny circular wound. No drainage or tenderness. Used Iodosorb ointment starting last week 03/21/17; still the depth and this wound. Using a #3 curet I remove necrotic material from the wound base. There is a divot here. Not making enough improvement on the Iodosorb, I change back to silver collagen this week. 03/28/17; depth down to 0.2. I cannot actually see a surface of this wound. We've been using silver collagen which I will continue Electronic Signature(s) Signed: 03/28/2017 5:35:48 PM By: Andrew Ham MD Entered By: Andrew Lyons on 03/28/2017 09:37:29 Andrew Lyons ([id number]) -------------------------------------------------------------------------------- Physical Exam Details Andrew Lyons Date of Service: 03/28/2017 9:00 AM Patient Name: T. Patient Account Number: [id number] Medical Record Treating RN: Andrew Lyons [id number] Number: Other Clinician: Date of Birth/Sex: 08-01-1946 (71 y.o. Male) Treating Andrew Lyons Primary Care Provider:  Wilhemena Lyons Provider/Extender: G Referring Provider: Cranford Lyons, Andrew Lyons in Treatment: 11 Constitutional Sitting or standing Blood Pressure is within target range for patient.. Pulse regular and within target range for patient.Marland Kitchen Respirations regular, non-labored and within target range.. Temperature is normal and within the target range for the patient.Marland Kitchen appears in no distress. Notes Wound exam; 0.2 cm in depth. Base of the wound is not visible but I elected not to debrided this today. No evidence of surrounding infection Electronic Signature(s) Signed: 03/28/2017 5:35:48 PM By: Andrew Ham MD Entered By: Andrew Lyons on 03/28/2017 09:38:51 Andrew Lyons ([id number]) -------------------------------------------------------------------------------- Physician Orders Details Andrew Lyons Date of Service: 03/28/2017 9:00 AM Patient Name: T. Patient Account Number: [id number] Medical Record Treating RN: Andrew Lyons [id number] Number: Other Clinician: Date of Birth/Sex: [birth year] (71 y.o. Male) Treating Andrew Lyons Primary Care Provider: Wilhemena Lyons Provider/Extender: G Referring Provider: Quintella Lyons in Treatment: 46 Verbal / Phone Orders: Yes Clinician: [name], [name] Read Back and Verified: Yes Diagnosis Coding Wound Cleansing Wound #1 Right Amputation Site - Below Knee o Clean wound with Normal Saline. o Cleanse wound with mild soap and water Anesthetic Wound #1 Right Amputation Site - Below Knee o Topical Lidocaine 4% cream applied to wound bed prior to debridement - in clinic only Skin Barriers/Peri-Wound Care Wound #1 Right Amputation Site - Below Knee o Skin Prep Primary Wound Dressing Wound #1 Right Amputation Site - Below Knee o Prisma Ag Secondary Dressing Wound #1 Right Amputation Site - Below Knee o Dry Gauze o Boardered Foam Dressing Dressing Change Frequency Wound #1 Right Amputation Site -  Below Knee o Change dressing every other day. Follow-up Appointments Wound #1 Right Amputation Site - Below Knee o Return Appointment in  1 week. Edema Control Wound #1 Right Amputation Site - Below Knee o Elevate legs to the level of the heart and pump ankles as often as possible Lyons, Andrew T. ([id number]) Off-Loading o Other: - do not wear your prosthesis Additional Orders / Instructions Wound #1 Right Amputation Site - Below Knee o Increase protein intake. Medications-please add to medication list. Wound #1 Right Amputation Site - Below Knee o Other: - vitamin C, zinc, MVI Electronic Signature(s) Signed: 03/28/2017 5:35:48 PM By: Andrew Ham MD Signed: 03/31/2017 4:17:34 PM By: Alric Quan Entered By: Alric Quan on 03/28/2017 09:17:04 Andrew Lyons ([id number]) -------------------------------------------------------------------------------- Problem List Details Andrew Lyons Date of Service: 03/28/2017 9:00 AM Patient Name: T. Patient Account Number: [id number] Medical Record Treating RN: Andrew Lyons [id number] Number: Other Clinician: Date of Birth/Sex: Aug 19, 1946 (71 y.o. 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 Patient Account Number: [id number] Medical Record Treating RN: Andrew Lyons [id number] Number: Other Clinician: Date of Birth/Sex: [birth year] (71 y.o. 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 History of Present Illness (HPI) 01/04/17; this is a 71 year old diabetic man who has a remote history of a traumatic lower extremity damage at age 89 requiring an amputation. He tells me spent 2 years walking on crutches then ultimately has been walking on prosthesis without any trouble since then. Several months ago he had a new prosthesis and developed an open area in the right stump in December. He saw podiatry Dr. Prudence Lyons who noted that this was really out of his practice jurisdiction and referred him here. He has been using mupirocin. He has continued to walk on the prosthesis. The prosthesis itself as been adjusted by the prostatitis. He does not have a known arterial issue. He tells me he probably has diabetic neuropathy. He had a wound that took a long time to heal surrounding the actual amputation itself however is not had more recent wounds. 01/10/17 Culture from last week grew pseudomonas. change AB to cefdinir yesterday from doxy. he is not using his prosthesis which I emphasized although he is having it adjusted next week 01/17/17; he is completing his antibiotics in the next day or 2. We've been using silver alginate. His prosthesis is away being readjusted. He had an x-ray of the underlying bone here apparently at his podiatrist's  office although I'll need to have a look at that and we can't find the result then he may need this re-x-rayed. 01/24/17; he has completed his antibiotics. Change to Silver collagen last week. No real change in the wound this week. 01/31/17 repeat culture I did last week showed a few Pseudomonas. This only has intermediate  sensitivity to quinolones I therefore put him back on a week of Cefdinir. X-ray of the stump did not show any bone destruction. We are attempting to get vascular studies done through Andrew Lyons's office on May 23. 02/07/17; arterial studies are booked for May 23 no major change. He has completed his antibiotics 02/14/17 arterial studies on Thursday 5/23. using collagen 02/21/17; the patient had his arterial studies. Both his ABIs were good at 1.0 on the right and 1.1 on the left. His abdominal and iliac arteries revealed atherosclerosis without focal stenosis duplex imaging of the right lower leg revealed heterogeneous plaque and biphasic Doppler waveforms from the right CFA to the popliteal artery. The right distal SFA and tibial peroneal trunk velocities were elevated. 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 The proximal right ATA and PTA were patent above the amputation level. We have been using Silver collagen with some improvement in the wound dimensions 02/28/17; we went ahead and did TCOM measurements on this patient. One of the leads was nonfunctional however on the right leg both the proximal and periwound leads registered greater than 70 mmHg oxygen Lyons, Andrew T. ([id number]) tension. Interestingly the comparison lead on the left leg/relative wound level was in the mid 40s. This allays any fears I had about contributing wound ischemia. In the meantime is measurements today were 0.2 x 0.4 x 0.7 the wound is now a small divot most of it appears to be epithelialized with only a small open area at that depth. There is no evidence of  surrounding infection 03/07/17; continued improvement down to 0.3 cm in depth. This is a very tiny circular opening. No drainage no tenderness. Looks like it is been progressing towards closure. He has been using Silver collagen although I think this is too small to use this as a primary dressing. 03/14/17; still 0.3 cm in depth tiny circular wound. No drainage or tenderness. Used Iodosorb ointment starting last week 03/21/17; still the depth and this wound. Using a #3 curet I remove necrotic material from the wound base. There is a divot here. Not making enough improvement on the Iodosorb, I change back to silver collagen this week. 03/28/17; depth down to 0.2. I cannot actually see a surface of this wound. We've been using silver collagen which I will continue Objective Constitutional Sitting or standing Blood Pressure is within target range for patient.. Pulse regular and within target range for patient.Marland Kitchen Respirations regular, non-labored and within target range.. Temperature is normal and within the target range for the patient.Marland Kitchen appears in no distress. Vitals Time Taken: 8:57 AM, Height: 70 in, Weight: 266.5 lbs, BMI: 38.2, Temperature: 97.7 F, Pulse: 61 bpm, Respiratory Rate: 18 breaths/min, Blood Pressure: 137/55 mmHg. General Notes: Wound exam; 0.2 cm in depth. Base of the wound is not visible but I elected not to debrided this today. No evidence of surrounding infection Integumentary (Hair, Skin) Wound #1 status is Open. Original cause of wound was Gradually Appeared. 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 The wound measures 0.2cm length x 0.2cm width x 0.2cm depth; 0.031cm^2 area and 0.006cm^3 volume. There is no tunneling or undermining noted. There is a large amount of serous drainage noted. The wound margin is distinct with the outline attached to the wound base. There is large (67-100%) pink, pale granulation within the wound bed. There is a small (1-33%)  amount of necrotic tissue within the wound bed including Adherent Slough. The periwound skin appearance exhibited: Callus,  Scarring, Dry/Scaly. The periwound skin appearance did not exhibit: Crepitus, Excoriation, Induration, Rash, Maceration, Atrophie Blanche, Cyanosis, Ecchymosis, Hemosiderin Staining, Mottled, Pallor, Rubor, Erythema. Periwound temperature was noted as No Abnormality. The periwound has Andrew Lyons, Andrew T. ([id number]) tenderness on palpation. 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 Cleanse wound with mild soap and water Anesthetic: Wound #1 Right Amputation Site - Below Knee: Topical Lidocaine 4% cream applied to wound bed prior to debridement - in clinic only Skin Barriers/Peri-Wound Care: Wound #1 Right Amputation Site - Below Knee: Skin Prep Primary Wound Dressing: Wound #1 Right Amputation Site - Below Knee: Prisma Ag Secondary Dressing: Wound #1 Right Amputation Site - Below Knee: Dry Gauze Boardered Foam Dressing Dressing Change Frequency: Wound #1 Right Amputation Site - Below Knee: Change dressing every other day. Follow-up Appointments: Wound #1 Right Amputation Site - Below Knee: Return Appointment in 1 week. Edema Control: Wound #1 Right Amputation Site - Below Knee: Elevate legs to the level of the heart and pump ankles as often as possible Off-Loading: Andrew Lyons ([id number]) Other: - do not wear your prosthesis Additional Orders / Instructions: Wound #1 Right Amputation Site - Below Knee: Increase protein intake. Medications-please add to medication  list.: Wound #1 Right Amputation Site - Below Knee: Other: - vitamin C, zinc, MVI #1 continue silver collagen. I am not certain if this is progressing towards closure not at this point #2 there is no evidence of surrounding infection Electronic Signature(s) Signed: 03/28/2017 5:35:48 PM By: Andrew Ham MD Entered By: Andrew Lyons on 03/28/2017 09:39:39 Andrew Lyons ([id number]) -------------------------------------------------------------------------------- SuperBill Details Andrew Lyons Date of Service: 03/28/2017 Patient Name: T. Patient Account Number: [id number] Medical Record Treating RN: Andrew Lyons [id number] Number: Other Clinician: Date of Birth/Sex: 11/07/1945 (71 y.o. 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

## 2017-04-04 ENCOUNTER — Encounter: Payer: PPO | Admitting: Physician Assistant

## 2017-04-04 DIAGNOSIS — S81801D Unspecified open wound, right lower leg, subsequent encounter: Secondary | ICD-10-CM | POA: Diagnosis not present

## 2017-04-04 DIAGNOSIS — E11622 Type 2 diabetes mellitus with other skin ulcer: Secondary | ICD-10-CM | POA: Diagnosis not present

## 2017-04-05 DIAGNOSIS — I2510 Atherosclerotic heart disease of native coronary artery without angina pectoris: Secondary | ICD-10-CM | POA: Diagnosis not present

## 2017-04-05 DIAGNOSIS — G4733 Obstructive sleep apnea (adult) (pediatric): Secondary | ICD-10-CM | POA: Diagnosis not present

## 2017-04-06 NOTE — Progress Notes (Signed)
BRAHM, BARBEAU ([id number]) Visit Report for 04/04/2017 Chief Complaint Document Details Patient Name: Andrew Lyons, Andrew Lyons Date of Service: 04/04/2017 8:45 AM Medical Record Number: [id number] Patient Account Number: [id number] Date of Birth/Sex: 1945-10-11 (71 y.o. 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 Electronic Signature(s) Signed: 04/04/2017 7:35:55 PM By: Worthy Keeler PA-C Entered By: Worthy Keeler on 04/04/2017 10:04:13 Andrew Lyons ([id number]) -------------------------------------------------------------------------------- HPI Details Patient Name: Andrew Lyons Date of Service: 04/04/2017 8:45 AM Medical Record Number: [id number] Patient Account Number: [id number] Date of Birth/Sex: July 11, 1946 (71 y.o. Male) Treating RN: [name], [name] Primary Care Provider: [name], Delfino Lovett Other Clinician: Referring Provider: [name], Delfino Lovett Treating Provider/Extender: Sharalyn Ink in Treatment: 12 History of Present Illness HPI Description: 01/04/17; this is a 71 year old diabetic man who has a remote history of a traumatic lower extremity damage at age 84 requiring an amputation. He tells me spent 2 years walking on crutches then ultimately has been walking on prosthesis without any trouble since then. Several months ago he had a new prosthesis and developed an open area in the right stump in December. He saw podiatry Dr. Prudence Davidson who noted that this was really out of his practice jurisdiction and referred him here. He has been using mupirocin. He has continued to walk on the prosthesis. The prosthesis itself as been adjusted by  the prostatitis. He does not have a known arterial issue. He tells me he probably has diabetic neuropathy. He had a wound that took a long time to heal surrounding the actual amputation itself however is not had more recent wounds. 01/10/17 Culture from last week grew pseudomonas. change AB to cefdinir yesterday from doxy. he is not using his prosthesis which I emphasized although he is having it adjusted next week 01/17/17; he is completing his antibiotics in the next day or 2. We've been using silver alginate. His prosthesis is away being readjusted. He had an x-ray of the underlying bone here apparently at his podiatrist's office although I'll need to have a look at that and we can't find the result then he may need this re-x-rayed. 01/24/17; he has completed his antibiotics. Change to Silver collagen last week. No real change in the wound this week. 01/31/17 repeat culture I did last week showed a few Pseudomonas. This only has intermediate sensitivity to quinolones I therefore put him back on a week of Cefdinir. X-ray of the stump did not show any bone destruction. We are attempting to get vascular studies done through Dr.Arida's office on May 23. 02/07/17; arterial studies are booked for May 23 no major change. He has completed his antibiotics 02/14/17 arterial studies on Thursday 5/23. using collagen 02/21/17; the patient had his arterial studies. Both his ABIs were good at 1.0 on the right and 1.1 on the left. His abdominal and iliac arteries revealed atherosclerosis without focal stenosis duplex imaging of the right lower leg revealed heterogeneous plaque and biphasic Doppler waveforms from the right CFA to the popliteal artery. The right distal SFA and tibial peroneal trunk velocities were elevated. 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 The proximal right ATA and PTA were patent above the amputation level. We have been using Silver collagen with some improvement in the  wound dimensions 02/28/17; we went ahead and did TCOM measurements on this patient. One of the leads was nonfunctional however on the right leg both the proximal and periwound leads registered greater than 70 mmHg oxygen tension. Interestingly the comparison lead on the left leg/relative wound level was in the mid 40s. This allays any fears I had about contributing wound ischemia. In the meantime is measurements today were 0.2 x 0.4 x 0.7 the wound is now a small divot most of it appears to be epithelialized with only a small open area at that depth. There is no evidence of surrounding infection 03/07/17; continued improvement down to 0.3 cm in depth. This is a very tiny circular opening. No drainage Andrew Lyons, Andrew T. ([id number]) no tenderness. Looks like it is been progressing towards closure. He has been using Silver collagen although I think this is too small to use this as a primary dressing. 03/14/17; still 0.3 cm in depth tiny circular wound. No drainage or tenderness. Used Iodosorb ointment starting last week 03/21/17; still the depth and this wound. Using a #3 curet I remove necrotic material from the wound base. There is a divot here. Not making enough improvement on the Iodosorb, I change back to silver collagen this week. 03/28/17; depth down to 0.2. I cannot actually see a surface of this wound. We've been using silver collagen which I will continue 04/04/17 on evaluation today patient appears to be doing very well. In fact his wound appears to be completely healed though there is a slight eschar over the area I see no fluctuance underneath and I am reluctant to attempt scraping on the area due to the very center superficial nature of the eschar. Patient has no discomfort. Electronic Signature(s) Signed: 04/04/2017 7:35:55 PM By: Worthy Keeler PA-C Entered By: Worthy Keeler on 04/04/2017 10:05:30 Andrew Lyons  ([id number]) -------------------------------------------------------------------------------- Physical Exam Details Patient Name: Andrew Lyons Date of Service: 04/04/2017 8:45 AM Medical Record Number: [id number] Patient Account Number: [id number] Date of Birth/Sex: 01/21/1946 (71 y.o. Male) Treating RN: [name], [name] Primary Care Provider: [name], Delfino Lovett Other Clinician: Referring Provider: [name], [name] Treating Provider/Extender: Melburn Hake, Devan Danzer Weeks in Treatment: 66 Constitutional Well-nourished and well-hydrated in no acute distress. Respiratory normal breathing without difficulty. clear to auscultation bilaterally. Cardiovascular regular rate and rhythm with normal S1, S2. Psychiatric this patient is able to make decisions and demonstrates good insight into disease process. Alert and Oriented x 3. pleasant and cooperative. Notes Patient's wound appears to be healed today with no drainage and no obvious opening regard to the distal right lower extremity. Electronic Signature(s) Signed: 04/04/2017 7:35:55 PM By: Worthy Keeler PA-C Entered By: Worthy Keeler on 04/04/2017 10:06:07 Andrew Lyons ([id number]) -------------------------------------------------------------------------------- Physician Orders Details Patient Name: Andrew Lyons Date of Service: 04/04/2017 8:45 AM Medical Record Number: [id number] Patient Account Number: [id number] Date of Birth/Sex: [birth year] (71 y.o. Male) Treating RN: [name], [name] Primary Care Provider: [name], Delfino Lovett Other Clinician: Referring Provider: [name] Treating Provider/Extender: Sharalyn Ink in Treatment: 12 Verbal / Phone Orders: Yes Clinician: [name], [name] Read Back and Verified: Yes Diagnosis Coding Skin Barriers/Peri-Wound Care o Skin Prep o Moisturizing lotion Secondary Dressing o Dry Gauze o Boardered Foam Dressing Dressing Change Frequency o  Change dressing every other day. Follow-up Appointments o Return Appointment in 1 week. Edema Control o Elevate legs to the level of the heart and pump ankles as often as possible Off-Loading o Turn and reposition every  2 hours Additional Orders / Instructions o Increase protein intake. Notes I'm going to recommend that we follow-up in one week just to ensure that this remains close in the meantime recommended a moisturizer such as Eucerin for this patient. We will see were things stand in one week. My suggestion on when he should attempt to utilize her prosthesis again going forward in the future would be to wait at least a month out from although he may want to build a little bit longer just ensure there are no problems. I did recommend that he discuss this with the prosthesis wrap as well. We will see him for reevaluation in one week. Electronic Signature(s) Signed: 04/04/2017 7:35:55 PM By: Worthy Keeler PA-C Entered By: Worthy Keeler on 04/04/2017 10:07:11 Andrew Lyons ([id number]) -------------------------------------------------------------------------------- Problem List Details Patient Name: Andrew Lyons Date of Service: 04/04/2017 8:45 AM Medical Record Number: [id number] Patient Account Number: [id number] Date of Birth/Sex: April 02, 1946 (71 y.o. 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 History of Present Illness (HPI) 01/04/17; this is a 71 year old diabetic man who has a remote history of a traumatic lower extremity damage at age 55 requiring an amputation. He tells me spent 2 years walking on crutches then ultimately has been walking on prosthesis without any trouble since then. Several months ago he had a new prosthesis and developed an open area in the right stump in December. He saw podiatry Dr. Prudence Davidson who noted that this was really out of his practice jurisdiction and referred him here. He has been using mupirocin. He has continued to walk on the prosthesis. The prosthesis itself as been adjusted by the prostatitis. He does not have a known arterial issue. He tells me he probably has diabetic neuropathy. He had a wound that took a long time to heal surrounding the actual amputation itself however is not had more recent wounds. 01/10/17 Culture from last week grew pseudomonas. change AB to cefdinir yesterday from doxy. he is not using his prosthesis which I emphasized although he is having it adjusted next week 01/17/17; he is  completing his antibiotics in the next day or 2. We've been using silver alginate. His prosthesis is away being readjusted. He had an x-ray of the underlying bone here apparently at his podiatrist's office although I'll need to have a look at that and we can't find the result then he may need this re-x-rayed. 01/24/17;  he has completed his antibiotics. Change to Silver collagen last week. No real change in the wound this week. 01/31/17 repeat culture I did last week showed a few Pseudomonas. This only has intermediate sensitivity to quinolones I therefore put him back on a week of Cefdinir. X-ray of the stump did not show any bone destruction. We are attempting to get vascular studies done through Dr.Arida's office on May 23. 02/07/17; arterial studies are booked for May 23 no major change. He has completed his antibiotics 02/14/17 arterial studies on Thursday 5/23. using collagen 02/21/17; the patient had his arterial studies. Both his ABIs were good at 1.0 on the right and 1.1 on the left. His abdominal and iliac arteries revealed atherosclerosis without focal stenosis duplex imaging of the right lower leg revealed heterogeneous plaque and biphasic Doppler waveforms from the right CFA to the popliteal artery. The right distal SFA and tibial peroneal trunk velocities were elevated. 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 The proximal right ATA and PTA were patent above the amputation level. We have been using Silver collagen with some improvement in the wound dimensions 02/28/17; we went ahead and did TCOM measurements on this patient. One of the leads was nonfunctional however on the right leg both the proximal and periwound leads registered greater than 70 mmHg oxygen tension. Interestingly the comparison lead on the left leg/relative wound level was in the mid 40s. This allays any fears I had about contributing wound ischemia. Andrew Lyons, Andrew Lyons ([id number]) In the meantime is  measurements today were 0.2 x 0.4 x 0.7 the wound is now a small divot most of it appears to be epithelialized with only a small open area at that depth. There is no evidence of surrounding infection 03/07/17; continued improvement down to 0.3 cm in depth. This is a very tiny circular opening. No drainage no tenderness. Looks like it is been progressing towards closure. He has been using Silver collagen although I think this is too small to use this as a primary dressing. 03/14/17; still 0.3 cm in depth tiny circular wound. No drainage or tenderness. Used Iodosorb ointment starting last week 03/21/17; still the depth and this wound. Using a #3 curet I remove necrotic material from the wound base. There is a divot here. Not making enough improvement on the Iodosorb, I change back to silver collagen this week. 03/28/17; depth down to 0.2. I cannot actually see a surface of this wound. We've been using silver collagen which I will continue 04/04/17 on evaluation today patient appears to be doing very well. In fact his wound appears to be completely healed though there is a slight eschar over the area I see no fluctuance underneath and I am reluctant to attempt scraping on the area due to the very center superficial nature of the eschar. Patient has no discomfort. Objective Constitutional Well-nourished and well-hydrated in no acute distress. Vitals Time Taken: 8:44 AM, Height: 70 in, Weight: 266.5 lbs, BMI: 38.2, Temperature: 97.5 F, Pulse: 61 bpm, Respiratory Rate: 18 breaths/min, Blood Pressure: 141/68 mmHg. Respiratory normal breathing without difficulty. clear to auscultation bilaterally. Cardiovascular regular rate and rhythm with normal S1, S2. Psychiatric this patient is able to make decisions and demonstrates good insight into disease process. Alert and Oriented x 3. pleasant and cooperative. General Notes: Patient's wound appears to be healed today with no drainage and no obvious  opening regard to the distal right lower extremity. Andrew Lyons, Andrew Lyons ([id number]) Integumentary (Hair, Skin) Wound #1 status is  Open. Original cause of wound was Gradually Appeared. 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 The wound measures 0cm length x 0cm width x 0cm depth; 0cm^2 area and 0cm^3 volume. There is no tunneling or undermining noted. There is a none present amount of drainage noted. The wound margin is distinct with the outline attached to the wound base. There is no granulation within the wound bed. There is no necrotic tissue within the wound bed. The periwound skin appearance exhibited: Callus, Scarring. The periwound skin appearance did not exhibit: Crepitus, Excoriation, Induration, Rash, Dry/Scaly, Maceration, Atrophie Blanche, Cyanosis, Ecchymosis, Hemosiderin Staining, Mottled, Pallor, Rubor, Erythema. Periwound temperature was noted as No Abnormality. The periwound has tenderness on palpation. 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 Follow-up Appointments: Return Appointment in 1 week. Edema Control: Elevate legs to the level of the heart and pump ankles as often as possible Off-Loading: Turn and reposition every 2 hours Additional Orders / Instructions: Increase protein intake. General Notes: I'm going to recommend that we follow-up in one week just to ensure that this remains close Andrew Lyons, Andrew T. ([id number]) in the meantime recommended a moisturizer such as Eucerin for  this patient. We will see were things stand in one week. My suggestion on when he should attempt to utilize her prosthesis again going forward in the future would be to wait at least a month out from although he may want to build a little bit longer just ensure there are no problems. I did recommend that he discuss this with the prosthesis wrap as well. We will see him for reevaluation in one week. Electronic Signature(s) Signed: 04/04/2017 7:35:55 PM By: Worthy Keeler PA-C Entered By: Worthy Keeler on 04/04/2017 10:07:24 Andrew Lyons ([id number]) -------------------------------------------------------------------------------- SuperBill Details Patient Name: Andrew Lyons Date of Service: 04/04/2017 Medical Record Number: [id number] Patient Account Number: [id number] Date of Birth/Sex: [birth year] (71 y.o. 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, right lower leg, S88.111D subsequent encounter L03.115 Cellulitis of right lower limb Facility Procedures CPT4 Code: [id number] Description: 99213 - WOUND CARE VISIT-LEV 3 EST PT Modifier: Quantity: 1 Physician Procedures CPT4: Description Modifier Quantity Code [id number] 99213 - WC PHYS LEVEL 3 - EST PT 1 ICD-10 Description Diagnosis E11.622 Type 2 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

## 2017-04-06 NOTE — Progress Notes (Signed)
Andrew Lyons, Andrew Lyons ([id number]) Visit Report for 04/04/2017 Arrival Information Details Patient Name: Andrew Lyons, Andrew Lyons Date of Service: 04/04/2017 8:45 AM Medical Record Number: [id number] Patient Account Number: [id number] Date of Birth/Sex: 1946/06/18 (71 y.o. Male) Treating RN: [name], [name] Primary Care Sheron Robin: [name], Delfino Lovett Other Clinician: Referring Elorah Dewing: [name] Treating Arville Postlewaite/Extender: Sharalyn Ink in Treatment: 12 Visit Information History Since Last Visit All ordered tests and consults were completed: No Patient Arrived: Other Added or deleted any medications: No Arrival Time: 08:44 Any new allergies or adverse reactions: No Accompanied By: grandson Had a fall or experienced change in No Transfer Assistance: EasyPivot activities of daily living that may affect Patient Lift risk of falls: Patient Identification Verified: Yes Signs or symptoms of abuse/neglect since last No Secondary Verification Process Yes visito Completed: Hospitalized since last visit: No Patient Requires Transmission- No Has Dressing in Place as Prescribed: Yes Based Precautions: Pain Present Now: No Patient Has Alerts: Yes Patient Alerts: DM II Electronic Signature(s) Signed: 04/04/2017 4:36:33 PM By: Alric Quan Entered By: Alric Quan on 04/04/2017 08:44:39 Andrew Lyons ([id number]) -------------------------------------------------------------------------------- Clinic Level of Care Assessment Details Patient Name: Andrew Lyons Date of Service: 04/04/2017 8:45 AM Medical Record Number: [id number] Patient Account Number: [id number] Date of Birth/Sex: January 12, 1946 (71 y.o. Male) Treating RN: [name], [name] Primary Care Velita Quirk: [name], Delfino Lovett Other Clinician: Referring Bob Eastwood: [name], [name] Treating Levern Pitter/Extender: Sharalyn Ink in Treatment: 12 Clinic Level of Care Assessment Items TOOL 4 Quantity  Score X - Use when only an EandM is performed on FOLLOW-UP visit 1 0 ASSESSMENTS - Nursing Assessment / Reassessment X - Reassessment of Co-morbidities (includes updates in patient status) 1 10 X - Reassessment of Adherence to Treatment Plan 1 5 ASSESSMENTS - Wound and Skin Assessment / Reassessment X - Simple Wound Assessment / Reassessment - one wound 1 5 _0  - Complex Wound Assessment / Reassessment - multiple wounds 0 _1  - Dermatologic / Skin Assessment (not related to wound area) 0 ASSESSMENTS - Focused Assessment _2  - Circumferential Edema Measurements - multi extremities 0 _3  - Nutritional Assessment / Counseling / Intervention 0 _4  - Lower Extremity Assessment (monofilament, tuning fork, pulses) 0 _5  - Peripheral Arterial Disease Assessment (using hand held doppler) 0 ASSESSMENTS - Ostomy and/or Continence Assessment and Care _6  - Incontinence Assessment and Management 0 _7  - Ostomy Care Assessment and Management (repouching, etc.) 0 PROCESS - Coordination of Care X - Simple Patient / Family Education for ongoing care 1 15 _8  - Complex (extensive) Patient / Family Education for ongoing care 0 _9  - Staff obtains Programmer, systems, Records, Test Results / Process Orders 0 _10  - Staff telephones HHA, Nursing Homes / Clarify orders / etc 0 _11  - Routine Transfer to another Facility (non-emergent condition) 0 Andrew Lyons, Andrew Lyons ([id number]) _12  - Routine Hospital Admission (non-emergent condition) 0 _13  - New Admissions / Biomedical engineer / Ordering NPWT, Apligraf, etc. 0 _14  - Emergency Hospital Admission (emergent condition) 0 X - Simple Discharge Coordination 1 10 _15  - Complex (extensive) Discharge Coordination 0 PROCESS - Special Needs _16  - Pediatric / Minor Patient Management 0 _17  - Isolation Patient Management 0 _18  - Hearing / Language / Visual special needs 0 _19  - Assessment of Community assistance (transportation, D/C planning, etc.) 0 _20  - Additional assistance / Altered  mentation 0 _21  - Support Surface(s) Assessment (bed, cushion, seat, etc.) 0 INTERVENTIONS - Wound Cleansing / Measurement X - Simple Wound Cleansing - one wound 1 5 _22  -  Complex Wound Cleansing - multiple wounds 0 X - Wound Imaging (photographs - any number of wounds) 1 5 _0  - Wound Tracing (instead of photographs) 0 X - Simple Wound Measurement - one wound 1 5 _1  - Complex Wound Measurement - multiple wounds 0 INTERVENTIONS - Wound Dressings X - Small Wound Dressing one or multiple wounds 1 10 _2  - Medium Wound Dressing one or multiple wounds 0 _3  - Large Wound Dressing one or multiple wounds 0 X - Application of Medications - topical 1 5 <[id number]>_2<\/OVZCHYIFOYDXAJOI>_7  - Application of Medications - injection 0 INTERVENTIONS - Miscellaneous _5  - External ear exam 0 Andrew Lyons, Andrew Lyons. ([id number]) _6  - Specimen Collection (cultures, biopsies, blood, body fluids, etc.) 0 _7  - Specimen(s) / Culture(s) sent or taken to Lab for analysis 0 _8  - Patient Transfer (multiple staff / Harrel Lemon Lift / Similar devices) 0 _9  - Simple Staple / Suture removal (25 or less) 0 _10  - Complex Staple / Suture removal (26 or more) 0 _11  - Hypo / Hyperglycemic Management (close monitor of Blood Glucose) 0 _12  - Ankle / Brachial Index (ABI) - do not check if billed separately 0 X - Vital Signs 1 5 Has the patient been seen at the hospital within the last three years: Yes Total Score: 80 Level Of Care: New/Established - Level 3 Electronic Signature(s) Unsigned Previous Signature: 04/04/2017 4:36:33 PM Version By: Alric Quan Entered By: Alric Quan on 04/05/2017 11:42:06 Signature(s): Date(s): Andrew Lyons ([id number]) -------------------------------------------------------------------------------- Encounter Discharge Information Details Patient Name: Andrew Lyons Date of Service: 04/04/2017 8:45 AM Medical Record Number: [id number] Patient Account Number: [id number] Date of Birth/Sex: 1946-08-17 (71 y.o.  Male) Treating RN: [name], [name] Primary Care Tanya Crothers: [name], Delfino Lovett Other Clinician: Referring Jasiah Buntin: [name] Treating Anjoli Diemer/Extender: Sharalyn Ink in Treatment: 12 Encounter Discharge Information Items Discharge Pain Level: 0 Discharge Condition: Stable Discharge Destination: Home Transportation: Private Auto Accompanied By: grandson Schedule Follow-up Appointment: Yes Medication Reconciliation completed No and provided to Patient/Care Kristle Wesch: Provided on Clinical Summary of Care: 04/03/2017 Form Type Recipient Paper Patient KA Electronic Signature(s) Signed: 04/04/2017 4:36:33 PM By: Alric Quan Previous Signature: 04/04/2017 9:04:02 AM Version By: Ruthine Dose Entered By: Alric Quan on 04/04/2017 09:15:14 Andrew Lyons ([id number]) -------------------------------------------------------------------------------- Lower Extremity Assessment Details Patient Name: Andrew Lyons Date of Service: 04/04/2017 8:45 AM Medical Record Number: [id number] Patient Account Number: [id number] Date of Birth/Sex: 12/17/1945 (71 y.o. Male) Treating RN: [name], [name] Primary Care Heather Streeper: [name], Delfino Lovett Other Clinician: Referring Isbella Arline: [name], [name] Treating Alilah Mcmeans/Extender: Melburn Hake, HOYT Weeks in Treatment: 12 Vascular Assessment Pulses: Posterior Tibial Extremity colors, hair growth, and conditions: Extremity Color: [Right:Normal] Temperature of Extremity: [Right:Warm] Capillary Refill: [Right:< 3 seconds] Electronic Signature(s) Signed: 04/04/2017 4:36:33 PM By: Alric Quan Entered By: Alric Quan on 04/04/2017 08:55:52 Andrew Lyons ([id number]) -------------------------------------------------------------------------------- Multi Wound Chart Details Patient Name: Andrew Lyons Date of Service: 04/04/2017 8:45 AM Medical Record Number: [id number] Patient Account Number:  [id number] Date of Birth/Sex: Sep 28, 1945 (71 y.o. Male) Treating RN: [name], [name] Primary Care Tomekia Helton: [name], Delfino Lovett Other Clinician: Referring Pennelope Basque: [name], [name] Treating Gunter Conde/Extender: Melburn Hake, HOYT Weeks in Treatment: 12 Vital Signs Height(in): 70 Pulse(bpm): 61 Weight(lbs): 266.5 Blood Pressure 141/68 (mmHg): Body Mass Index(BMI): 38 Temperature(F): 97.5 Respiratory Rate 18 (breaths/min): Wound Assessments Treatment Notes Electronic Signature(s) Signed: 04/04/2017 4:36:33 PM By: Alric Quan Entered By: Alric Quan on 04/04/2017 08:56:09 Andrew Lyons ([id number]) -------------------------------------------------------------------------------- [place] Details Patient Name: Andrew Lyons Date  of Service: 04/04/2017 8:45 AM Medical Record Number: [id number] Patient Account Number: [id number] Date of Birth/Sex: Dec 27, 1945 (71 y.o. Male) Treating RN: [name], [name] Primary Care Lafaye Mcelmurry: [name], Delfino Lovett Other Clinician: Referring Annalia Metzger: [name], Delfino Lovett Treating Brennan Litzinger/Extender: Melburn Hake, HOYT Weeks in Treatment: 12 Active Inactive ` Abuse / Safety / Falls / Self Care Management Nursing Diagnoses: Potential for falls Goals: Patient will remain injury free Date Target Resolution Date Initiated: 01/04/2017 Inactivated: 04/04/2017 Date: 04/01/2017 Goal Status: Met Patient will remain injury free related to falls Date Initiated: 04/04/2017 Target Resolution Date: 04/29/2017 Goal Status: Active Interventions: Assess fall risk on admission and as needed Assess self care needs on admission and as needed Notes: ` Orientation to the Wound Care Program Nursing Diagnoses: Knowledge deficit related to the wound healing center program Goals: Patient/caregiver will verbalize understanding of the [place] Program Date Initiated: 01/04/2017 Target Resolution Date: 04/29/2017 Goal Status:  Active Interventions: Provide education on orientation to the wound center Notes: Andrew Lyons, Andrew Lyons ([id number]) Electronic Signature(s) Signed: 04/04/2017 4:36:33 PM By: Alric Quan Entered By: Alric Quan on 04/04/2017 08:59:39 Andrew Lyons ([id number]) -------------------------------------------------------------------------------- Pain Assessment Details Patient Name: Andrew Lyons Date of Service: 04/04/2017 8:45 AM Medical Record Number: [id number] Patient Account Number: [id number] Date of Birth/Sex: [birth year] (71 y.o. Male) Treating RN: [name], [name] Primary Care Darald Uzzle: [name], Delfino Lovett Other Clinician: Referring Dessie Delcarlo: [name] Treating Makail Watling/Extender: Melburn Hake, HOYT Weeks in Treatment: 12 Active Problems Location of Pain Severity and Description of Pain Patient Has Paino No Site Locations With Dressing Change: No Pain Management and Medication Current Pain Management: Electronic Signature(s) Signed: 04/04/2017 4:36:33 PM By: Alric Quan Entered By: Alric Quan on 04/04/2017 08:44:47 Andrew Lyons ([id number]) -------------------------------------------------------------------------------- Patient/Caregiver Education Details Patient Name: Andrew Lyons Date of Service: 04/04/2017 8:45 AM Medical Record Number: [id number] Patient Account Number: [id number] Date of Birth/Gender: [birth year] (71 y.o. Male) Treating RN: [name], [name] Primary Care Physician: [name], Delfino Lovett Other Clinician: Referring Physician: [name] Treating Physician/Extender: Sharalyn Ink in Treatment: 12 Education Assessment Education Provided To: Patient Education Topics Provided Wound/Skin Impairment: Handouts: Other: change dressing as ordered Methods: Demonstration, Explain/Verbal Responses: State content correctly Electronic Signature(s) Signed: 04/04/2017 4:36:33 PM By: Alric Quan Entered By: Alric Quan on 04/04/2017 09:15:24 Andrew Lyons ([id number]) -------------------------------------------------------------------------------- Wound Assessment Details Patient Name: Andrew Lyons Date of Service: 04/04/2017 8:45 AM Medical Record Number: [id number] Patient Account Number: [id number] Date of Birth/Sex: March 01, 1946 (71 y.o. 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 Wound Open Status: Wounding Event: Gradually Appeared Comorbid Cataracts, Chronic Obstructive Date Acquired: 08/26/2016 History: Pulmonary Disease (COPD), Sleep Weeks Of Treatment: 12 Apnea, Hypotension, Myocardial Clustered Wound: No Infarction, Type II Diabetes, Osteoarthritis, Neuropathy Photos Photo Uploaded By: Alric Quan on 04/04/2017 09:18:58 Wound Measurements Length: (cm) Width: (cm) Depth: (cm) Area: (cm) Volume: (cm) 0 % Reduction in Area: 100% 0 % Reduction in Volume: 100% 0 Epithelialization: Large (67-100%) 0 Tunneling: No 0 Undermining: No Wound Description Full Thickness With Exposed Classification: Support Structures Diabetic Severity Grade 1 (Wagner): Wound Margin: Distinct, outline attached Exudate Amount: None Present Foul Odor After Cleansing: No Slough/Fibrino No Wound Bed Granulation Amount: None Present (0%) Exposed Structure Andrew Lyons, Andrew T. ([id number]) Necrotic Amount: None Present (0%) Fascia Exposed: No Fat Layer (  Subcutaneous Tissue) Exposed: No Tendon Exposed: No Muscle Exposed: No Joint Exposed: No Bone Exposed: No Periwound Skin Texture Texture Color No Abnormalities Noted: No No Abnormalities Noted: No Callus: Yes Atrophie Blanche:  No Crepitus: No Cyanosis: No Excoriation: No Ecchymosis: No Induration: No Erythema: No Rash: No Hemosiderin Staining: No Scarring: Yes Mottled: No Pallor: No Moisture Rubor: No No Abnormalities Noted: No Dry / Scaly: No Temperature / Pain Maceration: No Temperature: No Abnormality Tenderness on Palpation: Yes Wound Preparation Ulcer Cleansing: Rinsed/Irrigated with Saline Topical Anesthetic Applied: None Electronic Signature(s) Signed: 04/04/2017 4:36:33 PM By: Alric Quan Entered By: Alric Quan on 04/04/2017 08:57:56 Andrew Lyons ([id number]) -------------------------------------------------------------------------------- [place] Details Patient Name: Andrew Lyons Date of Service: 04/04/2017 8:45 AM Medical Record Number: [id number] Patient Account Number: [id number] Date of Birth/Sex: 1945-11-17 (71 y.o. Male) Treating RN: [name], [name] Primary Care Billy Turvey: [name], Delfino Lovett Other Clinician: Referring Castiel Lauricella: [name], [name] Treating Tavio Biegel/Extender: Melburn Hake, HOYT Weeks in Treatment: 12 Vital Signs Time Taken: 08:44 Temperature (F): 97.5 Height (in): 70 Pulse (bpm): 61 Weight (lbs): 266.5 Respiratory Rate (breaths/min): 18 Body Mass Index (BMI): 38.2 Blood Pressure (mmHg): 141/68 Reference Range: 80 - 120 mg / dl Electronic Signature(s) Signed: 04/04/2017 4:36:33 PM By: Alric Quan Entered By: Alric Quan on 04/04/2017 08:46:50

## 2017-04-11 ENCOUNTER — Encounter: Payer: PPO | Admitting: Internal Medicine

## 2017-04-11 DIAGNOSIS — R2241 Localized swelling, mass and lump, right lower limb: Secondary | ICD-10-CM | POA: Diagnosis not present

## 2017-04-11 DIAGNOSIS — E11622 Type 2 diabetes mellitus with other skin ulcer: Secondary | ICD-10-CM | POA: Diagnosis not present

## 2017-04-13 NOTE — Progress Notes (Signed)
Andrew Lyons ([id number]) Visit Report for 04/11/2017 HPI Details Andrew Lyons Date of Service: 04/11/2017 8:45 AM Patient Name: T. Patient Account Number: [id number] Medical Record Treating RN: Andrew Lyons [id number] Number: Other Clinician: Date of Birth/Sex: 04-13-1946 (71 y.o. Male) Treating Andrew Lyons Primary Care Provider: Wilhemena Lyons Provider/Extender: G Referring Provider: Quintella Lyons in Treatment: 13 History of Present Illness HPI Description: 01/04/17; this is a 71 year old diabetic man who has a remote history of a traumatic lower extremity damage at age 75 requiring an amputation. He tells me spent 2 years walking on crutches then ultimately has been walking on prosthesis without any trouble since then. Several months ago he had a new prosthesis and developed an open area in the right stump in December. He saw podiatry Andrew Lyons who noted that this was really out of his practice jurisdiction and referred him here. He has been using mupirocin. He has continued to walk on the prosthesis. The prosthesis itself as been adjusted by the prostatitis. He does not have a known arterial issue. He tells me he probably has diabetic neuropathy. He had a wound that took a long time to heal surrounding the actual amputation itself however is not had more recent wounds. 01/10/17 Culture from last week grew pseudomonas. change AB to cefdinir yesterday from doxy. he is not using his prosthesis which I emphasized although he is having it adjusted next week 01/17/17; he is completing his antibiotics in the next day or 2. We've been using silver alginate. His prosthesis is away being readjusted. He had an x-ray of the underlying bone here apparently at his podiatrist's office although I'll need to have a look at that and we can't find the result then he may need this re-x-rayed. 01/24/17; he has completed his antibiotics. Change to Silver collagen last week. No  real change in the wound this week. 01/31/17 repeat culture I did last week showed a few Pseudomonas. This only has intermediate sensitivity to quinolones I therefore put him back on a week of Cefdinir. X-ray of the stump did not show any bone destruction. We are attempting to get vascular studies done through Andrew Lyons's office on May 23. 02/07/17; arterial studies are booked for May 23 no major change. He has completed his antibiotics 02/14/17 arterial studies on Thursday 5/23. using collagen 02/21/17; the patient had his arterial studies. Both his ABIs were good at 1.0 on the right and 1.1 on the left. His abdominal and iliac arteries revealed atherosclerosis without focal stenosis duplex imaging of the right lower leg revealed heterogeneous plaque and biphasic Doppler waveforms from the right CFA to the popliteal artery. The right distal SFA and tibial peroneal trunk velocities were elevated. 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 The proximal right ATA and PTA were patent above the amputation level. We have been using Silver collagen with some improvement in the wound dimensions 02/28/17; we went ahead and did TCOM measurements on this patient. One of the leads was nonfunctional however on the right leg both the proximal and periwound leads registered greater than 70 mmHg oxygen tension. Interestingly the comparison lead on the left leg/relative wound level was in the mid 40s. This allays any fears I had about contributing wound ischemia. Andrew Lyons, Andrew Lyons ([id number]) In the meantime is measurements today were 0.2 x 0.4 x 0.7 the wound is now a small divot most of it appears to be epithelialized with only a small open area at that depth. There is no  evidence of surrounding infection 03/07/17; continued improvement down to 0.3 cm in depth. This is a very tiny circular opening. No drainage no tenderness. Looks like it is been progressing towards closure. He has been using Silver  collagen although I think this is too small to use this as a primary dressing. 03/14/17; still 0.3 cm in depth tiny circular wound. No drainage or tenderness. Used Iodosorb ointment starting last week 03/21/17; still the depth and this wound. Using a #3 curet I remove necrotic material from the wound base. There is a divot here. Not making enough improvement on the Iodosorb, I change back to silver collagen this week. 03/28/17; depth down to 0.2. I cannot actually see a surface of this wound. We've been using silver collagen which I will continue 04/04/17 on evaluation today patient appears to be doing very well. In fact his wound appears to be completely healed though there is a slight eschar over the area I see no fluctuance underneath and I am reluctant to attempt scraping on the area due to the very center superficial nature of the eschar. Patient has no discomfort. 04/11/17; the patient went back to [facility] to his prosthetist however his leg was too swollen on the right deficient into the prosthesis. The wound itself on the tip of his stump on the right is closed and was close last week. I see no reason to be concerned about this. Electronic Signature(s) Signed: 04/11/2017 5:59:51 PM By: Andrew Ham MD Entered By: Andrew Lyons on 04/11/2017 09:16:46 Andrew Lyons ([id number]) -------------------------------------------------------------------------------- Physical Exam Details Andrew Lyons Date of Service: 04/11/2017 8:45 AM Patient Name: T. Patient Account Number: [id number] Medical Record Treating RN: Andrew Lyons [id number] Number: Other Clinician: Date of Birth/Sex: Feb 02, 1946 (71 y.o. Male) Treating Andrew Lyons Primary Care Provider: Wilhemena Lyons Provider/Extender: G Referring Provider: Quintella Lyons in Treatment: 5 Constitutional Patient is hypertensive.. Pulse regular and within target range for patient.Marland Kitchen Respirations regular,  non-labored and within target range.. Temperature is normal and within the target range for the patient.Marland Kitchen appears in no distress. Respiratory Respiratory effort is easy and symmetric bilaterally. Rate is normal at rest and on room air.. Bilateral breath sounds are clear and equal in all lobes with no wheezes, rales or rhonchi.. Cardiovascular 1/6 midsystolic murmur. JVP is not elevated. I cannot feel the right popliteal pulse although I have not been able to feel this. His arterial evaluation was unremarkable. Edema present in both extremities. The edema in the right leg is 2+ in the left it's 3+. He has more obvious venous insufficiency in the left there is no evidence of a DVT. Lymphatic None palpable in the right popliteal or inguinal area. Psychiatric No evidence of depression, anxiety, or agitation. Calm, cooperative, and communicative. Appropriate interactions and affect.. Notes Wound exam; the right stump has no open area. There is a scant amount of eschar however I don't think there is anything that is worth a debridement today. The surrounding tissue looks healthy. Electronic Signature(s) Signed: 04/11/2017 5:59:51 PM By: Andrew Ham MD Entered By: Andrew Lyons on 04/11/2017 09:18:38 Andrew Lyons ([id number]) -------------------------------------------------------------------------------- Physician Orders Details Andrew Lyons Date of Service: 04/11/2017 8:45 AM Patient Name: T. Patient Account Number: [id number] Medical Record Treating RN: Andrew Lyons [id number] Number: Other Clinician: Date of Birth/Sex: [birth year] (71 y.o. Male) Treating Andrew Lyons Primary Care Provider: Wilhemena Lyons Provider/Extender: G Referring Provider: Quintella Lyons in Treatment: 63 Verbal / Phone Orders: Yes Clinician: [name], [name] Read Back and  Verified: Yes Diagnosis Coding Discharge From [facility] Services o Discharge from [place]  legs elevated as much as possible. Wear ace wrap on stump and if swelling continues notify your PCP. Monitor area of where the wound was. Please call our office if you have any questions or concerns. Electronic Signature(s) Signed: 04/11/2017 5:59:51 PM By: Andrew Ham MD Signed: 04/12/2017 5:03:57 PM By: Alric Quan Entered By: Alric Quan on 04/11/2017 09:20:49 Andrew Lyons ([id number]) -------------------------------------------------------------------------------- Problem List Details Andrew Lyons Date of Service: 04/11/2017 8:45 AM Patient Name: T. Patient Account Number: [id number] Medical Record Treating RN: Andrew Lyons [id number] Number: Other Clinician: Date of Birth/Sex: Jan 02, 1946 (71 y.o. 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 Patient Account Number: [id number] Medical Record Treating RN: Andrew Lyons [id number] Number: Other Clinician: Date of Birth/Sex: March 30, 1946 (71 y.o. Male) Treating Andrew Lyons Primary Care Provider: Wilhemena Lyons  Provider/Extender: G Referring Provider: Quintella Lyons in Treatment: 13 Subjective History of Present Illness (HPI) 01/04/17; this is a 71 year old diabetic man who has a remote history of a traumatic lower extremity damage at age 37 requiring an amputation. He tells me spent 2 years walking on crutches then ultimately has been walking on prosthesis without any trouble since then. Several months ago he had a new prosthesis and developed an open area in the right stump in December. He saw podiatry Andrew Lyons who noted that this was really out of his practice jurisdiction and referred him here. He has been using mupirocin. He has continued to walk on the prosthesis. The prosthesis itself as been adjusted by the prostatitis. He does not have a known arterial issue. He tells me he probably has diabetic neuropathy. He had a wound that took a long time to heal surrounding the actual amputation itself however is not had more recent wounds. 01/10/17 Culture from last week grew pseudomonas. change AB to cefdinir yesterday from doxy. he is not using his prosthesis which I emphasized although he is having it adjusted next week 01/17/17; he is completing his antibiotics in the next day or 2. We've been using silver alginate. His prosthesis is away being readjusted. He had an x-ray of the underlying bone here apparently at his podiatrist's office although I'll need to have a look at that and we can't find the result then he may need this re-x-rayed. 01/24/17; he has completed his antibiotics. Change to Silver collagen last week. No real change in the wound this week. 01/31/17 repeat culture I did last week showed a few Pseudomonas. This only has intermediate sensitivity to quinolones I therefore put him back on a week of Cefdinir. X-ray of the stump did not show any bone destruction. We are attempting to get vascular studies done through Andrew Lyons's office on May 23. 02/07/17; arterial studies are  booked for May 23 no major  change. He has completed his antibiotics 02/14/17 arterial studies on Thursday 5/23. using collagen 02/21/17; the patient had his arterial studies. Both his ABIs were good at 1.0 on the right and 1.1 on the left. His abdominal and iliac arteries revealed atherosclerosis without focal stenosis duplex imaging of the right lower leg revealed heterogeneous plaque and biphasic Doppler waveforms from the right CFA to the popliteal artery. The right distal SFA and tibial peroneal trunk velocities were elevated. 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 The proximal right ATA and PTA were patent above the amputation level. We have been using Silver collagen with some improvement in the wound dimensions 02/28/17; we went ahead and did TCOM measurements on this patient. One of the leads was nonfunctional however on the right leg both the proximal and periwound leads registered greater than 70 mmHg oxygen tension. Interestingly the comparison lead on the left leg/relative wound level was in the mid 40s. This allays any fears I had about contributing wound ischemia. In the meantime is measurements today were 0.2 x 0.4 x 0.7 the wound is now a small divot most of it appears to be epithelialized with only a small open area at that depth. There is no evidence of surrounding Andrew Lyons, Andrew T. ([id number]) infection 03/07/17; continued improvement down to 0.3 cm in depth. This is a very tiny circular opening. No drainage no tenderness. Looks like it is been progressing towards closure. He has been using Silver collagen although I think this is too small to use this as a primary dressing. 03/14/17; still 0.3 cm in depth tiny circular wound. No drainage or tenderness. Used Iodosorb ointment starting last week 03/21/17; still the depth and this wound. Using a #3 curet I remove necrotic material from the wound base. There is a divot here. Not making enough improvement on the  Iodosorb, I change back to silver collagen this week. 03/28/17; depth down to 0.2. I cannot actually see a surface of this wound. We've been using silver collagen which I will continue 04/04/17 on evaluation today patient appears to be doing very well. In fact his wound appears to be completely healed though there is a slight eschar over the area I see no fluctuance underneath and I am reluctant to attempt scraping on the area due to the very center superficial nature of the eschar. Patient has no discomfort. 04/11/17; the patient went back to [facility] to his prosthetist however his leg was too swollen on the right deficient into the prosthesis. The wound itself on the tip of his stump on the right is closed and was close last week. I see no reason to be concerned about this. Objective Constitutional Patient is hypertensive.. Pulse regular and within target range for patient.Marland Kitchen Respirations regular, non-labored and within target range.. Temperature is normal and within the target range for the patient.Marland Kitchen appears in no distress. Vitals Time Taken: 8:47 AM, Height: 70 in, Weight: 266.5 lbs, BMI: 38.2, Temperature: 97.6 F, Pulse: 64 bpm, Respiratory Rate: 18 breaths/min, Blood Pressure: 151/69 mmHg. Respiratory Respiratory effort is easy and symmetric bilaterally. Rate is normal at rest and on room air.. Bilateral breath sounds are clear and equal in all lobes with no wheezes, rales or rhonchi.. Cardiovascular 1/6 midsystolic murmur. JVP is not elevated. I cannot feel the right popliteal pulse although I have not been able to feel this. His arterial evaluation was unremarkable. Edema present in both extremities. The edema in the right leg is 2+ in the left it's 3+.  He has more obvious venous insufficiency in the left there is no evidence of a DVT. Lymphatic None palpable in the right popliteal or inguinal area. Andrew Lyons, Andrew Lyons ([id number]) Psychiatric No evidence of depression,  anxiety, or agitation. Calm, cooperative, and communicative. Appropriate interactions and affect.. General Notes: Wound exam; the right stump has no open area. There is a scant amount of eschar however I don't think there is anything that is worth a debridement today. The surrounding tissue looks healthy. 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 Monitor area of where the wound was. Please call our office if you have any questions or concerns. 1I think the patient can be discharged today 2 we Ace wrap the right lower leg hopefully to lessen the amount of swelling. He actually has more edema in the left leg. If the Ace wrap isn't effective he may need a small dose of oral diuretic to get the edema out of the right leg so he can fit into his prosthesis. #3 I didn't see anything that was really worrisome in terms of the wound and I am hopeful that this will maintain itself with his adjusted prosthesis when he can fit the leg into it. Andrew Lyons, Andrew Lyons ([id number]) Electronic Signature(s) Signed: 04/11/2017 5:59:51 PM By: Andrew Ham MD Entered By: Andrew Lyons on 04/11/2017 09:20:19 Andrew Lyons, Andrew Lyons ([id number]) -------------------------------------------------------------------------------- SuperBill Details Andrew Lyons Date of Service: 04/11/2017 Patient Name: T. Patient Account Number: [id number] Medical Record Treating RN: Andrew Lyons [id number] Number: Other Clinician: Date of Birth/Sex: October 22, 1945 (71 y.o. 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, right lower leg, S88.111D subsequent encounter L03.115 Cellulitis of right lower limb Facility Procedures CPT4 Code: [id number] Description: [phone] - WOUND CARE VISIT-LEV 2 EST PT Modifier: Quantity: 1 Physician Procedures CPT4 Code Description: [id number] - WC PHYS LEVEL 3 - EST PT ICD-10 Description Diagnosis E11.622 Type 2 diabetes mellitus with other skin ulcer L97.313 Non-pressure chronic ulcer of right ankle with necr Modifier: osis of muscl Quantity: 1 e Electronic Signature(s) Signed: 04/11/2017 5:59:51 PM By: Andrew Ham MD Signed: 04/12/2017 5:03:57 PM By: Alric Quan Entered By: Alric Quan on 04/11/2017 09:21:30

## 2017-04-14 NOTE — Progress Notes (Signed)
Andrew Lyons, Andrew Lyons ([id number]) Visit Report for 04/11/2017 Arrival Information Details Patient Name: Andrew Lyons, Andrew Lyons Date of Service: 04/11/2017 8:45 AM Medical Record Number: [id number] Patient Account Number: [id number] Date of Birth/Sex: [birth year] (71 y.o. Male) Treating RN: [name], [name] Primary Care Clarke Amburn: [name], Delfino Lovett Other Clinician: Referring Merlin Golden: [name], [name] Treating Karelly Dewalt/Extender: [name] in Treatment: 13 Visit Information History Since Last Visit All ordered tests and consults were completed: No Patient Arrived: Other Added or deleted any medications: No Arrival Time: 08:46 Any new allergies or adverse reactions: No Accompanied By: self Had a fall or experienced change in No Transfer Assistance: EasyPivot activities of daily living that may affect Patient Lift risk of falls: Patient Identification Verified: Yes Signs or symptoms of abuse/neglect since last No Secondary Verification Process Yes visito Completed: Hospitalized since last visit: No Patient Requires Transmission- No Pain Present Now: No Based Precautions: Patient Has Alerts: Yes Patient Alerts: DM II Electronic Signature(s) Signed: 04/12/2017 5:03:57 PM By: Alric Quan Entered By: Alric Quan on 04/11/2017 08:46:58 Andrew Lyons ([id number]) -------------------------------------------------------------------------------- Clinic Level of Care Assessment Details Patient Name: Andrew Lyons Date of Service: 04/11/2017 8:45 AM Medical Record Number: [id number] Patient Account Number: [id number] Date of Birth/Sex: August 28, 1946 (71 y.o. Male) Treating RN: [name], [name] Primary Care Chalee Hirota: [name], Delfino Lovett Other Clinician: Referring Junaid Wurzer: [name], [name] Treating Silus Lanzo/Extender: [name] in Treatment: 13 Clinic Level of Care Assessment Items TOOL 4 Quantity Score X - Use when only an EandM is  performed on FOLLOW-UP visit 1 0 ASSESSMENTS - Nursing Assessment / Reassessment X - Reassessment of Co-morbidities (includes updates in patient status) 1 10 X - Reassessment of Adherence to Treatment Plan 1 5 ASSESSMENTS - Wound and Skin Assessment / Reassessment X - Simple Wound Assessment / Reassessment - one wound 1 5 []  - Complex Wound Assessment / Reassessment - multiple wounds 0 []  - Dermatologic / Skin Assessment (not related to wound area) 0 ASSESSMENTS - Focused Assessment []  - Circumferential Edema Measurements - multi extremities 0 []  - Nutritional Assessment / Counseling / Intervention 0 []  - Lower Extremity Assessment (monofilament, tuning fork, pulses) 0 []  - Peripheral Arterial Disease Assessment (using hand held doppler) 0 ASSESSMENTS - Ostomy and/or Continence Assessment and Care []  - Incontinence Assessment and Management 0 []  - Ostomy Care Assessment and Management (repouching, etc.) 0 PROCESS - Coordination of Care X - Simple Patient / Family Education for ongoing care 1 15 []  - Complex (extensive) Patient / Family Education for ongoing care 0 []  - Staff obtains Programmer, systems, Records, Test Results / Process Orders 0 []  - Staff telephones HHA, Nursing Homes / Clarify orders / etc 0 []  - Routine Transfer to another Facility (non-emergent condition) 0 BABAK, LUCUS ([id number]) []  - Routine Hospital Admission (non-emergent condition) 0 []  - New Admissions / Biomedical engineer / Ordering NPWT, Apligraf, etc. 0 []  - Emergency Hospital Admission (emergent condition) 0 X - Simple Discharge Coordination 1 10 []  - Complex (extensive) Discharge Coordination 0 PROCESS - Special Needs []  - Pediatric / Minor Patient Management 0 []  - Isolation Patient Management 0 []  - Hearing / Language / Visual special needs 0 []  - Assessment of Community assistance (transportation, D/C planning, etc.) 0 []  - Additional assistance / Altered mentation 0 []  - Support Surface(s)  Assessment (bed, cushion, seat, etc.) 0 INTERVENTIONS - Wound Cleansing / Measurement []  - Simple Wound Cleansing - one wound 0 []  - Complex Wound Cleansing - multiple wounds 0 []  -  Wound Imaging (photographs - any number of wounds) 0 []  - Wound Tracing (instead of photographs) 0 []  - Simple Wound Measurement - one wound 0 []  - Complex Wound Measurement - multiple wounds 0 INTERVENTIONS - Wound Dressings []  - Small Wound Dressing one or multiple wounds 0 []  - Medium Wound Dressing one or multiple wounds 0 []  - Large Wound Dressing one or multiple wounds 0 []  - Application of Medications - topical 0 []  - Application of Medications - injection 0 INTERVENTIONS - Miscellaneous []  - External ear exam 0 JAYLAND, NULL. ([id number]) []  - Specimen Collection (cultures, biopsies, blood, body fluids, etc.) 0 []  - Specimen(s) / Culture(s) sent or taken to Lab for analysis 0 []  - Patient Transfer (multiple staff / Harrel Lemon Lift / Similar devices) 0 []  - Simple Staple / Suture removal (25 or less) 0 []  - Complex Staple / Suture removal (26 or more) 0 []  - Hypo / Hyperglycemic Management (close monitor of Blood Glucose) 0 []  - Ankle / Brachial Index (ABI) - do not check if billed separately 0 X - Vital Signs 1 5 Has the patient been seen at the hospital within the last three years: Yes Total Score: 50 Level Of Care: New/Established - Level 2 Electronic Signature(s) Signed: 04/12/2017 5:03:57 PM By: Alric Quan Entered By: Alric Quan on 04/11/2017 09:21:21 Andrew Lyons ([id number]) -------------------------------------------------------------------------------- Encounter Discharge Information Details Patient Name: Andrew Lyons Date of Service: 04/11/2017 8:45 AM Medical Record Number: [id number] Patient Account Number: [id number] Date of Birth/Sex: 21-Dec-1945 (71 y.o. Male) Treating RN: [name], [name] Primary Care Alta Shober: [name], Delfino Lovett Other  Clinician: Referring Adria Costley: [name], [name] Treating Elanore Talcott/Extender: [name] in Treatment: 13 Encounter Discharge Information Items Discharge Pain Level: 0 Discharge Condition: Stable Ambulatory Status: Walker Discharge Destination: Home Transportation: Private Auto Accompanied By: self Schedule Follow-up Appointment: No Medication Reconciliation completed No and provided to Patient/Care Berry Gallacher: Provided on Clinical Summary of Care: 04/11/2017 Form Type Recipient Paper Patient KA Electronic Signature(s) Signed: 04/11/2017 9:17:15 AM By: Ruthine Dose Entered By: Ruthine Dose on 04/11/2017 09:17:14 Andrew Lyons ([id number]) -------------------------------------------------------------------------------- Lower Extremity Assessment Details Patient Name: Andrew Lyons Date of Service: 04/11/2017 8:45 AM Medical Record Number: [id number] Patient Account Number: [id number] Date of Birth/Sex: 12-Dec-1945 (71 y.o. Male) Treating RN: [name], [name] Primary Care Taffany Heiser: [name], Delfino Lovett Other Clinician: Referring Hava Massingale: [name], [name] Treating Lucresha Dismuke/Extender: [name] in Treatment: 13 Vascular Assessment Pulses: Posterior Tibial Extremity colors, hair growth, and conditions: Extremity Color: [Right:Normal] Temperature of Extremity: [Right:Warm] Capillary Refill: [Right:< 3 seconds] Electronic Signature(s) Signed: 04/12/2017 5:03:57 PM By: Alric Quan Entered By: Alric Quan on 04/11/2017 08:48:52 Andrew Lyons ([id number]) -------------------------------------------------------------------------------- Multi Wound Chart Details Patient Name: Andrew Lyons Date of Service: 04/11/2017 8:45 AM Medical Record Number: [id number] Patient Account Number: [id number] Date of Birth/Sex: 1946/08/25 (71 y.o. Male) Treating RN: [name], [name] Primary Care Malaika Arnall: [name], Delfino Lovett Other  Clinician: Referring Elim Economou: [name], [name] Treating Cleburn Maiolo/Extender: [name] in Treatment: 13 Vital Signs Height(in): 70 Pulse(bpm): 64 Weight(lbs): 266.5 Blood Pressure 151/69 (mmHg): Body Mass Index(BMI): 38 Temperature(F): 97.6 Respiratory Rate 18 (breaths/min): Wound Assessments Treatment Notes Electronic Signature(s) Signed: 04/11/2017 5:59:51 PM By: Linton Ham MD Entered By: Linton Ham on 04/11/2017 09:15:16 Andrew Lyons ([id number]) -------------------------------------------------------------------------------- Multi-Disciplinary Care Plan Details Patient Name: Andrew Lyons Date of Service: 04/11/2017 8:45 AM Medical Record Number: [id number] Patient Account Number: [id number] Date of Birth/Sex: 08-08-1946 (71 y.o. Male) Treating RN: [name],  [name] Primary Care Mackensie Pilson: [name], Delfino Lovett Other Clinician: Referring Dyani Babel: [name], [name] Treating Jasslyn Finkel/Extender: [name] in Treatment: 13 Active Inactive Electronic Signature(s) Signed: 04/12/2017 5:03:57 PM By: Alric Quan Entered By: Alric Quan on 04/11/2017 09:20:10 Andrew Lyons ([id number]) -------------------------------------------------------------------------------- Pain Assessment Details Patient Name: Andrew Lyons Date of Service: 04/11/2017 8:45 AM Medical Record Number: [id number] Patient Account Number: [id number] Date of Birth/Sex: 1946-02-09 (71 y.o. Male) Treating RN: [name], [name] Primary Care Shakila Mak: [name], Delfino Lovett Other Clinician: Referring Makeisha Jentsch: [name], [name] Treating Kyson Kupper/Extender: [name] in Treatment: 13 Active Problems Location of Pain Severity and Description of Pain Patient Has Paino No Site Locations With Dressing Change: No Pain Management and Medication Current Pain Management: Electronic Signature(s) Signed: 04/12/2017 5:03:57 PM By:  Alric Quan Entered By: Alric Quan on 04/11/2017 08:47:19 Andrew Lyons ([id number]) -------------------------------------------------------------------------------- Patient/Caregiver Education Details [name] Date of Service: 04/11/2017 8:45 AM Patient Name: T. Patient Account Number: [id number] Medical Record Treating RN: [name] [id number] Number: Other Clinician: Date of Birth/Gender: [birth year] (71 y.o. Male) Treating Linton Ham Primary Care Physician: [name] Physician/Extender: G Referring Physician: [name] in Treatment: 13 Education Assessment Education Provided To: Patient Education Topics Provided Wound/Skin Impairment: Handouts: Other: Please call our office if you have any questions or concerns. Electronic Signature(s) Signed: 04/12/2017 5:03:57 PM By: Alric Quan Entered By: Alric Quan on 04/11/2017 09:14:54 Andrew Lyons ([id number]) -------------------------------------------------------------------------------- [place] Details Patient Name: Andrew Lyons Date of Service: 04/11/2017 8:45 AM Medical Record Number: [id number] Patient Account Number: [id number] Date of Birth/Sex: 03-31-1946 (71 y.o. Male) Treating RN: [name], [name] Primary Care Kenleigh Toback: [name], Delfino Lovett Other Clinician: Referring Yolande Skoda: [name], [name] Treating Jafar Poffenberger/Extender: [name] in Treatment: 13 Vital Signs Time Taken: 08:47 Temperature (F): 97.6 Height (in): 70 Pulse (bpm): 64 Weight (lbs): 266.5 Respiratory Rate (breaths/min): 18 Body Mass Index (BMI): 38.2 Blood Pressure (mmHg): 151/69 Reference Range: 80 - 120 mg / dl Electronic Signature(s) Signed: 04/12/2017 5:03:57 PM By: Alric Quan Entered By: Alric Quan on 04/11/2017 08:48:41

## 2017-04-20 DIAGNOSIS — I2510 Atherosclerotic heart disease of native coronary artery without angina pectoris: Secondary | ICD-10-CM | POA: Diagnosis not present

## 2017-04-20 DIAGNOSIS — Q231 Congenital insufficiency of aortic valve: Secondary | ICD-10-CM | POA: Diagnosis not present

## 2017-04-20 DIAGNOSIS — G4733 Obstructive sleep apnea (adult) (pediatric): Secondary | ICD-10-CM | POA: Diagnosis not present

## 2017-04-20 DIAGNOSIS — I10 Essential (primary) hypertension: Secondary | ICD-10-CM | POA: Diagnosis not present

## 2017-04-21 ENCOUNTER — Other Ambulatory Visit: Payer: Self-pay | Admitting: Family Medicine

## 2017-05-15 ENCOUNTER — Telehealth: Payer: Self-pay | Admitting: Family Medicine

## 2017-05-15 NOTE — Telephone Encounter (Signed)
Pt contacted office for refill request on the following medications:  tamsulosin (FLOMAX) 0.4 MG CAPS capsule  VICTOZA 18 MG/3ML SOPN   Pt wife states pt is in the donut hole with insurace and is requesting samples if possible.   CB#[phone]/MW

## 2017-05-18 ENCOUNTER — Ambulatory Visit (INDEPENDENT_AMBULATORY_CARE_PROVIDER_SITE_OTHER): Payer: PPO

## 2017-05-18 VITALS — BP 168/88 | HR 64 | Temp 98.0°F | Ht 70.0 in | Wt 267.0 lb

## 2017-05-18 DIAGNOSIS — Z0000 Encounter for general adult medical examination without abnormal findings: Secondary | ICD-10-CM | POA: Diagnosis not present

## 2017-05-18 DIAGNOSIS — Z1159 Encounter for screening for other viral diseases: Secondary | ICD-10-CM

## 2017-05-18 NOTE — Patient Instructions (Signed)
Andrew Lyons , Thank you for taking time to come for your Medicare Wellness Visit. I appreciate your ongoing commitment to your health goals. Please review the following plan we discussed and let me know if I can assist you in the future.   Screening recommendations/referrals: Colonoscopy: up to date Recommended yearly ophthalmology/optometry visit for glaucoma screening and checkup Recommended yearly dental visit for hygiene and checkup  Vaccinations: Influenza vaccine: declined today, will receive at next OV Pneumococcal vaccine: completed series Tdap vaccine: up to date, due 05/2020 Shingles vaccine: completed 10/22/12  Advanced directives: Please bring a copy of your POA (Power of [place]) and/or Living Will to your next appointment.   Conditions/risks identified: Obesity- Recommend eating 3 small meals a day and two healthy protein snacks in between.   Next appointment: 05/22/17  Preventive Care 65 Years and Older, Male Preventive care refers to lifestyle choices and visits with your health care provider that can promote health and wellness. What does preventive care include?  A yearly physical exam. This is also called an annual well check.  Dental exams once or twice a year.  Routine eye exams. Ask your health care provider how often you should have your eyes checked.  Personal lifestyle choices, including:  Daily care of your teeth and gums.  Regular physical activity.  Eating a healthy diet.  Avoiding tobacco and drug use.  Limiting alcohol use.  Practicing safe sex.  Taking low doses of aspirin every day.  Taking vitamin and mineral supplements as recommended by your health care provider. What happens during an annual well check? The services and screenings done by your health care provider during your annual well check will depend on your age, overall health, lifestyle risk factors, and family history of disease. Counseling  Your health care provider may  ask you questions about your:  Alcohol use.  Tobacco use.  Drug use.  Emotional well-being.  Home and relationship well-being.  Sexual activity.  Eating habits.  History of falls.  Memory and ability to understand (cognition).  Work and work Statistician. Screening  You may have the following tests or measurements:  Height, weight, and BMI.  Blood pressure.  Lipid and cholesterol levels. These may be checked every 5 years, or more frequently if you are over 69 years old.  Skin check.  Lung cancer screening. You may have this screening every year starting at age 55 if you have a 30-pack-year history of smoking and currently smoke or have quit within the past 15 years.  Fecal occult blood test (FOBT) of the stool. You may have this test every year starting at age 38.  Flexible sigmoidoscopy or colonoscopy. You may have a sigmoidoscopy every 5 years or a colonoscopy every 10 years starting at age 11.  Prostate cancer screening. Recommendations will vary depending on your family history and other risks.  Hepatitis C blood test.  Hepatitis B blood test.  Sexually transmitted disease (STD) testing.  Diabetes screening. This is done by checking your blood sugar (glucose) after you have not eaten for a while (fasting). You may have this done every 1-3 years.  Abdominal aortic aneurysm (AAA) screening. You may need this if you are a current or former smoker.  Osteoporosis. You may be screened starting at age 41 if you are at high risk. Talk with your health care provider about your test results, treatment options, and if necessary, the need for more tests. Vaccines  Your health care provider may recommend certain vaccines, such as:  Influenza vaccine. This is recommended every year.  Tetanus, diphtheria, and acellular pertussis (Tdap, Td) vaccine. You may need a Td booster every 10 years.  Zoster vaccine. You may need this after age 55.  Pneumococcal 13-valent  conjugate (PCV13) vaccine. One dose is recommended after age 73.  Pneumococcal polysaccharide (PPSV23) vaccine. One dose is recommended after age 11. Talk to your health care provider about which screenings and vaccines you need and how often you need them. This information is not intended to replace advice given to you by your health care provider. Make sure you discuss any questions you have with your health care provider. Document Released: 10/09/2015 Document Revised: 06/01/2016 Document Reviewed: 07/14/2015 Elsevier Interactive Patient Education  2017 [place] Prevention in the Home Falls can cause injuries. They can happen to people of all ages. There are many things you can do to make your home safe and to help prevent falls. What can I do on the outside of my home?  Regularly fix the edges of walkways and driveways and fix any cracks.  Remove anything that might make you trip as you walk through a door, such as a raised step or threshold.  Trim any bushes or trees on the path to your home.  Use bright outdoor lighting.  Clear any walking paths of anything that might make someone trip, such as rocks or tools.  Regularly check to see if handrails are loose or broken. Make sure that both sides of any steps have handrails.  Any raised decks and porches should have guardrails on the edges.  Have any leaves, snow, or ice cleared regularly.  Use sand or salt on walking paths during winter.  Clean up any spills in your garage right away. This includes oil or grease spills. What can I do in the bathroom?  Use night lights.  Install grab bars by the toilet and in the tub and shower. Do not use towel bars as grab bars.  Use non-skid mats or decals in the tub or shower.  If you need to sit down in the shower, use a plastic, non-slip stool.  Keep the floor dry. Clean up any water that spills on the floor as soon as it happens.  Remove soap buildup in the tub or  shower regularly.  Attach bath mats securely with double-sided non-slip rug tape.  Do not have throw rugs and other things on the floor that can make you trip. What can I do in the bedroom?  Use night lights.  Make sure that you have a light by your bed that is easy to reach.  Do not use any sheets or blankets that are too big for your bed. They should not hang down onto the floor.  Have a firm chair that has side arms. You can use this for support while you get dressed.  Do not have throw rugs and other things on the floor that can make you trip. What can I do in the kitchen?  Clean up any spills right away.  Avoid walking on wet floors.  Keep items that you use a lot in easy-to-reach places.  If you need to reach something above you, use a strong step stool that has a grab bar.  Keep electrical cords out of the way.  Do not use floor polish or wax that makes floors slippery. If you must use wax, use non-skid floor wax.  Do not have throw rugs and other things on the floor that  can make you trip. What can I do with my stairs?  Do not leave any items on the stairs.  Make sure that there are handrails on both sides of the stairs and use them. Fix handrails that are broken or loose. Make sure that handrails are as long as the stairways.  Check any carpeting to make sure that it is firmly attached to the stairs. Fix any carpet that is loose or worn.  Avoid having throw rugs at the top or bottom of the stairs. If you do have throw rugs, attach them to the floor with carpet tape.  Make sure that you have a light switch at the top of the stairs and the bottom of the stairs. If you do not have them, ask someone to add them for you. What else can I do to help prevent falls?  Wear shoes that:  Do not have high heels.  Have rubber bottoms.  Are comfortable and fit you well.  Are closed at the toe. Do not wear sandals.  If you use a stepladder:  Make sure that it is fully  opened. Do not climb a closed stepladder.  Make sure that both sides of the stepladder are locked into place.  Ask someone to hold it for you, if possible.  Clearly mark and make sure that you can see:  Any grab bars or handrails.  First and last steps.  Where the edge of each step is.  Use tools that help you move around (mobility aids) if they are needed. These include:  Canes.  Walkers.  Scooters.  Crutches.  Turn on the lights when you go into a dark area. Replace any light bulbs as soon as they burn out.  Set up your furniture so you have a clear path. Avoid moving your furniture around.  If any of your floors are uneven, fix them.  If there are any pets around you, be aware of where they are.  Review your medicines with your doctor. Some medicines can make you feel dizzy. This can increase your chance of falling. Ask your doctor what other things that you can do to help prevent falls. This information is not intended to replace advice given to you by your health care provider. Make sure you discuss any questions you have with your health care provider. Document Released: 07/09/2009 Document Revised: 02/18/2016 Document Reviewed: 10/17/2014 Elsevier Interactive Patient Education  2017 [employer].

## 2017-05-18 NOTE — Progress Notes (Signed)
Subjective:   Andrew Lyons is a 71 y.o. male who presents for Medicare Annual/Subsequent preventive examination.  Review of Systems:  N/A  Cardiac Risk Factors include: advanced age (>25mn, >>27women);diabetes mellitus;dyslipidemia;hypertension;male gender;obesity (BMI >30kg/m2)     Objective:    Vitals: BP (!) 168/88 (BP Location: Left Arm)   Pulse 64   Temp 98 F (36.7 C) (Oral)   Ht '5\' 10"'$  (1.778 m)   Wt 267 lb (121.1 kg)   BMI 38.31 kg/m   Body mass index is 38.31 kg/m.  Tobacco History  Smoking Status  . Former Smoker  Smokeless Tobacco  . Never Used    Comment: Quit smoking in 2003; Started smoking at age 71 smoked about 40 years, smoked over 3 packs per day     Counseling given: Not Answered   Past Medical History:  Diagnosis Date  . 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 Barrett's esophagus with dysplasia   . COPD [place] disease) ([place]   . Coronary artery disease   . Diabetes mellitus without complication ([place]   . Hepatitis   . Hyperlipidemia   . Hypertension   . Myocardial infarction ([place]   . Seizures ([place]   . Sleep apnea    Past Surgical History:  Procedure Laterality Date  . CARDIAC CATHETERIZATION     with Angioplasty  . CARDIAC VALVE REPLACEMENT     Aortic Valve Replacement  . COLONOSCOPY WITH PROPOFOL N/A 03/18/2015   Procedure: COLONOSCOPY WITH PROPOFOL;  Surgeon: RManya Silvas MD;  [facility];  Service: Endoscopy;  Laterality: N/A;  . ESOPHAGOGASTRODUODENOSCOPY N/A 03/18/2015   Procedure: ESOPHAGOGASTRODUODENOSCOPY (EGD);  Surgeon: RManya Silvas MD;  [facility];  Service: Endoscopy;  Laterality: N/A;  . FOOT AMPUTATION    . THORACOTOMY Right   . TONSILLECTOMY     Family History  Problem Relation Age of Onset  . Obesity Son   . Diabetes Brother   . Hypertension Brother   . Heart attack Mother   . Heart attack Father    History  Sexual Activity  .  Sexual activity: Not on file    Outpatient Encounter Prescriptions as of 05/18/2017  Medication Sig  . amLODipine-benazepril (LOTREL) 5-40 MG capsule Take 1 capsule by mouth daily.  .Marland Kitchenaspirin 325 MG EC tablet Take 325 mg by mouth daily.  . B-D UF III MINI PEN NEEDLES 31G X 5 MM MISC USE WITH PEN DAILY  . Blood Glucose Monitoring Suppl (ONE TOUCH ULTRA SYSTEM KIT) w/Device KIT To use daily to check sugar. DX E11.9-needs one touch ultra meter  . co-enzyme Q-10 30 MG capsule Take 100 mg by mouth daily.  . empagliflozin (JARDIANCE) 25 MG TABS tablet Take 25 mg by mouth daily.  . fluticasone (FLONASE) 50 MCG/ACT nasal spray USE 2 SPRAYS IN EACH NOSTRIL EVERY DAY  . glucose blood (ONE TOUCH ULTRA TEST) test strip Check sugar 3 times daily. DX E11.9-strips for one touch ultra meter  . metFORMIN (GLUCOPHAGE) 500 MG tablet Take 500 mg by mouth 2 (two) times daily.  . metoprolol succinate (TOPROL-XL) 100 MG 24 hr tablet TAKE 1 TABLET BY MOUTH EVERY DAY  . montelukast (SINGULAIR) 10 MG tablet TAKE 1 TABLET BY MOUTH EVERY DAY  . Multiple Vitamin (MULTIVITAMIN) capsule Take 1 capsule by mouth daily.   .Marland Kitchenomeprazole (PRILOSEC) 40 MG capsule TAKE ONE CAPSULE BY MOUTH EVERY DAY  . ONETOUCH DELICA LANCETS FINE MISC 1 Device by  Does not apply route 3 (three) times daily.  . rosuvastatin (CRESTOR) 20 MG tablet TAKE 1 TABLET BY MOUTH EVERY DAY  . sildenafil (VIAGRA) 100 MG tablet USE AS DIRECTED  . tamsulosin (FLOMAX) 0.4 MG CAPS capsule TAKE 1 CAPSULE (0.4 MG TOTAL) BY MOUTH DAILY.  Marland Kitchen VICTOZA 18 MG/3ML SOPN INJECT 0.6 SUBCUTANEOUSLY EVERY DAY (Patient taking differently: INJECT 1.2 SUBCUTANEOUSLY EVERY DAY)  . VITAMIN C, CALCIUM ASCORBATE, PO Take by mouth daily.  . [DISCONTINUED] glucose blood (ONETOUCH VERIO) test strip Check sugar twice daily. DX E11.9-needs one touch ultra verio flex strips  . [DISCONTINUED] mometasone (ELOCON) 0.1 % cream Apply 1 application topically daily.  . [DISCONTINUED] sildenafil  (REVATIO) 20 MG tablet Take 20 mg by mouth 3 (three) times daily.  . [DISCONTINUED] tamsulosin (FLOMAX) 0.4 MG CAPS capsule Take 1 capsule (0.4 mg total) by mouth daily.   No facility-administered encounter medications on file as of 05/18/2017.     Activities of Daily Living In your present state of health, do you have any difficulty performing the following activities: 05/18/2017  Hearing? Y  Vision? N  Difficulty concentrating or making decisions? N  Walking or climbing stairs? N  Dressing or bathing? N  Doing errands, shopping? N  Preparing Food and eating ? N  Using the Toilet? N  In the past six months, have you accidently leaked urine? N  Do you have problems with loss of bowel control? N  Managing your Medications? N  Managing your Finances? N  Housekeeping or managing your Housekeeping? N  Some recent data might be hidden    Patient Care Team: [name]., MD as PCP - General (Family Medicine)   Assessment:     Exercise Activities and Dietary recommendations Current Exercise Habits: Home exercise routine, Type of exercise: walking, Time (Minutes): 15, Frequency (Times/Week): 2, Weekly Exercise (Minutes/Week): 30, Intensity: Mild, Exercise limited by: None identified  Goals    None     Fall Risk Fall Risk  05/18/2017 05/16/2016 08/24/2015  Falls in the past year? No No No   Depression Screen PHQ 2/9 Scores 05/18/2017 05/16/2016 08/24/2015  PHQ - 2 Score 0 0 0    Cognitive Function- Pt declined screening today.        Immunization History  Administered Date(s) Administered  . Influenza, High Dose Seasonal PF 08/05/2016  . Influenza,inj,Quad PF,6+ Mos 09/11/2015  . Pneumococcal Conjugate-13 08/14/2014  . Pneumococcal Polysaccharide-23 08/02/2007, 10/22/2012  . Tdap 06/09/2010  . Zoster 10/22/2012   Screening Tests Health Maintenance  Topic Date Due  . Hepatitis C Screening  04/21/1946  . OPHTHALMOLOGY EXAM  03/04/1957  . INFLUENZA VACCINE   04/26/2017  . HEMOGLOBIN A1C  07/25/2017  . FOOT EXAM  01/23/2018  . TETANUS/TDAP  06/09/2020  . COLONOSCOPY  03/17/2025  . PNA vac Low Risk Adult  Completed      Plan:  I have personally reviewed and addressed the Medicare Annual Wellness questionnaire and have noted the following in the patient's chart:  A. Medical and social history B. Use of alcohol, tobacco or illicit drugs  C. Current medications and supplements D. Functional ability and status E.  Nutritional status F.  Physical activity G. Advance directives H. List of other physicians I.  Hospitalizations, surgeries, and ER visits in previous 12 months J.  [place] such as hearing and vision if needed, cognitive and depression L. Referrals and appointments - none  In addition, I have reviewed and discussed with patient certain  preventive protocols, quality metrics, and best practice recommendations. A written personalized care plan for preventive services as well as general preventive health recommendations were provided to patient.  See attached scanned questionnaire for additional information.   Signed,  Fabio Neighbors, LPN Nurse Health Advisor   MD Recommendations: Pt to schedule eye exam within the next year. Pt declined flu vaccine today and would like to receive at next OV on 05/22/17.

## 2017-05-19 LAB — HEPATITIS C ANTIBODY: Hep C Virus Ab: <0.1 s/co ratio (ref 0.0–0.9)

## 2017-05-22 ENCOUNTER — Ambulatory Visit (INDEPENDENT_AMBULATORY_CARE_PROVIDER_SITE_OTHER): Payer: PPO | Admitting: Family Medicine

## 2017-05-22 VITALS — BP 122/70 | HR 68 | Temp 97.7°F | Resp 16 | Wt 365.0 lb

## 2017-05-22 DIAGNOSIS — E118 Type 2 diabetes mellitus with unspecified complications: Secondary | ICD-10-CM

## 2017-05-22 DIAGNOSIS — Z23 Encounter for immunization: Secondary | ICD-10-CM

## 2017-05-22 LAB — POCT GLYCOSYLATED HEMOGLOBIN (HGB A1C): Hemoglobin A1C: 6.5

## 2017-05-22 NOTE — Progress Notes (Signed)
Andrew Lyons  MRN: [id number] DOB: [birth year]  Subjective:  HPI   The patient is a 71 year old male who presents for follow up of his diabetes.  He is not checking his glucose at home but denies any symptoms suggestive of hypoglycemia. His last visit was on 05/18/17 with the nurse health adviser.  Last A1C was 6.6 on 01/23/17.  Patient Active Problem List   Diagnosis Date Noted  . Traumatic lower limb amputation ([place]) 10/26/2015  . OAB (overactive bladder) 08/17/2015  . Allergic rhinitis 03/31/2015  . Arthritis 03/31/2015  . Benign fibroma of prostate 03/31/2015  . Atherosclerosis of coronary artery 03/31/2015  . Diabetes ([place]) 03/31/2015  . ED (erectile dysfunction) of organic origin 03/31/2015  . Acid reflux 03/31/2015  . HLD (hyperlipidemia) 03/31/2015  . BP (high blood pressure) 03/31/2015  . Malaise and fatigue 03/31/2015  . Combined fat and carbohydrate induced hyperlipemia 03/31/2015  . Adiposity 03/31/2015  . Obstructive apnea 03/31/2015  . Barrett esophagus 02/18/2015  . H/O adenomatous polyp of colon 02/18/2015  . Benign essential HTN 02/12/2015  . Aortic valve, bicuspid 01/15/2014  . Arteriosclerosis of coronary artery 01/15/2014  . H/O aortic valve replacement 01/15/2014    Past Medical History:  Diagnosis Date  . 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 Barrett's esophagus with dysplasia   . COPD (chronic obstructive pulmonary disease) ([place])   . Coronary artery disease   . Diabetes mellitus without complication ([place])   . Hepatitis   . Hyperlipidemia   . Hypertension   . Myocardial infarction ([place])   . Seizures ([place])   . Sleep apnea     Social History   Social History  . Marital status: Married    Spouse name: N/A  . Number of children: N/A  . Years of education: N/A   Occupational History  . Not on file.   Social History Main Topics  . Smoking status: Former Research scientist (life sciences)  . Smokeless tobacco: Never Used     Comment: Quit smoking in  2003; Started smoking at age 84, smoked about 40 years, smoked over 3 packs per day  . Alcohol use Yes     Comment: Occcasional- beer  . Drug use: No  . Sexual activity: Not on file   Other Topics Concern  . Not on file   Social History Narrative  . No narrative on file    Outpatient Encounter Prescriptions as of 05/22/2017  Medication Sig  . amLODipine-benazepril (LOTREL) 5-40 MG capsule Take 1 capsule by mouth daily.  Marland Kitchen aspirin 325 MG EC tablet Take 325 mg by mouth daily.  . B-D UF III MINI PEN NEEDLES 31G X 5 MM MISC USE WITH PEN DAILY  . Blood Glucose Monitoring Suppl (ONE TOUCH ULTRA SYSTEM KIT) w/Device KIT To use daily to check sugar. DX E11.9-needs one touch ultra meter  . co-enzyme Q-10 30 MG capsule Take 100 mg by mouth daily.  . empagliflozin (JARDIANCE) 25 MG TABS tablet Take 25 mg by mouth daily.  . fluticasone (FLONASE) 50 MCG/ACT nasal spray USE 2 SPRAYS IN EACH NOSTRIL EVERY DAY  . glucose blood (ONE TOUCH ULTRA TEST) test strip Check sugar 3 times daily. DX E11.9-strips for one touch ultra meter  . metFORMIN (GLUCOPHAGE) 500 MG tablet Take 500 mg by mouth 2 (two) times daily.  . metoprolol succinate (TOPROL-XL) 100 MG 24 hr tablet TAKE 1 TABLET BY MOUTH EVERY DAY  . montelukast (SINGULAIR) 10 MG tablet TAKE 1 TABLET  BY MOUTH EVERY DAY  . Multiple Vitamin (MULTIVITAMIN) capsule Take 1 capsule by mouth daily.   Marland Kitchen omeprazole (PRILOSEC) 40 MG capsule TAKE ONE CAPSULE BY MOUTH EVERY DAY  . ONETOUCH DELICA LANCETS FINE MISC 1 Device by Does not apply route 3 (three) times daily.  . rosuvastatin (CRESTOR) 20 MG tablet TAKE 1 TABLET BY MOUTH EVERY DAY  . sildenafil (VIAGRA) 100 MG tablet USE AS DIRECTED  . tamsulosin (FLOMAX) 0.4 MG CAPS capsule TAKE 1 CAPSULE (0.4 MG TOTAL) BY MOUTH DAILY.  Marland Kitchen VICTOZA 18 MG/3ML SOPN INJECT 0.6 SUBCUTANEOUSLY EVERY DAY (Patient taking differently: INJECT 1.2 SUBCUTANEOUSLY EVERY DAY)  . VITAMIN C, CALCIUM ASCORBATE, PO Take by mouth daily.    No facility-administered encounter medications on file as of 05/22/2017.     Allergies  Allergen Reactions  . Iodinated Diagnostic Agents Rash    had a red chest -unusre of what kind of dye it was  . Zyban [Bupropion]   . Zocor [Simvastatin]     Review of Systems  Constitutional: Negative for fever and malaise/fatigue.  Eyes: Negative.   Respiratory: Negative for cough, shortness of breath and wheezing.   Cardiovascular: Negative for chest pain, palpitations and orthopnea.  Gastrointestinal: Negative.   Neurological: Negative.  Negative for weakness.  Endo/Heme/Allergies: Negative.   Psychiatric/Behavioral: Negative.     Objective:  BP 122/70 (BP Location: Right Arm, Patient Position: Sitting, Cuff Size: Normal)   Pulse 68   Temp 97.7 F (36.5 C) (Oral)   Resp 16   Wt (!) 365 lb (165.6 kg)   BMI 52.37 kg/m   Physical Exam  Constitutional: He is oriented to person, place, and time and well-developed, well-nourished, and in no distress.  HENT:  Head: Normocephalic and atraumatic.  Right Ear: External ear normal.  Left Ear: External ear normal.  Nose: Nose normal.  Eyes: Pupils are equal, round, and reactive to light. Conjunctivae are normal.  Neck: Normal range of motion.  Cardiovascular: Normal rate, regular rhythm, normal heart sounds and intact distal pulses.   Pulmonary/Chest: Effort normal and breath sounds normal.  Abdominal: Soft.  Musculoskeletal: He exhibits edema (trace).  Neurological: He is alert and oriented to person, place, and time. GCS score is 15.  Skin: Skin is warm and dry.  Psychiatric: Mood, memory, affect and judgment normal.    Assessment and Plan :  1. Type 2 diabetes mellitus with complication, without long-term current use of insulin (HCC)  - POCT glycosylated hemoglobin (Hb A1C)--6.5 today.  2. Need for influenza vaccination  - Flu vaccine HIGH DOSE PF (Fluzone High dose) 3.CAD 4.HTN 5.HLD  I have done the exam and reviewed the  chart and it is accurate to the best of my knowledge. Development worker, community has been used and  any errors in dictation or transcription are unintentional. Miguel Aschoff M.D. [facility]

## 2017-06-29 DIAGNOSIS — D485 Neoplasm of uncertain behavior of skin: Secondary | ICD-10-CM | POA: Diagnosis not present

## 2017-06-29 DIAGNOSIS — L918 Other hypertrophic disorders of the skin: Secondary | ICD-10-CM | POA: Diagnosis not present

## 2017-06-29 DIAGNOSIS — D229 Melanocytic nevi, unspecified: Secondary | ICD-10-CM | POA: Diagnosis not present

## 2017-06-29 DIAGNOSIS — L578 Other skin changes due to chronic exposure to nonionizing radiation: Secondary | ICD-10-CM | POA: Diagnosis not present

## 2017-06-29 DIAGNOSIS — Z1283 Encounter for screening for malignant neoplasm of skin: Secondary | ICD-10-CM | POA: Diagnosis not present

## 2017-06-29 DIAGNOSIS — B351 Tinea unguium: Secondary | ICD-10-CM | POA: Diagnosis not present

## 2017-06-29 DIAGNOSIS — L812 Freckles: Secondary | ICD-10-CM | POA: Diagnosis not present

## 2017-06-29 DIAGNOSIS — L821 Other seborrheic keratosis: Secondary | ICD-10-CM | POA: Diagnosis not present

## 2017-06-29 DIAGNOSIS — I8310 Varicose veins of unspecified lower extremity with inflammation: Secondary | ICD-10-CM | POA: Diagnosis not present

## 2017-06-29 DIAGNOSIS — D1800 Hemangioma unspecified site: Secondary | ICD-10-CM | POA: Diagnosis not present

## 2017-07-03 DIAGNOSIS — T8189XA Other complications of procedures, not elsewhere classified, initial encounter: Secondary | ICD-10-CM | POA: Diagnosis not present

## 2017-08-31 DIAGNOSIS — I8311 Varicose veins of right lower extremity with inflammation: Secondary | ICD-10-CM | POA: Diagnosis not present

## 2017-08-31 DIAGNOSIS — I8312 Varicose veins of left lower extremity with inflammation: Secondary | ICD-10-CM | POA: Diagnosis not present

## 2017-09-01 ENCOUNTER — Other Ambulatory Visit: Payer: Self-pay | Admitting: Family Medicine

## 2017-09-01 DIAGNOSIS — I10 Essential (primary) hypertension: Secondary | ICD-10-CM

## 2017-09-01 DIAGNOSIS — E118 Type 2 diabetes mellitus with unspecified complications: Secondary | ICD-10-CM

## 2017-09-01 MED ORDER — AMLODIPINE BESY-BENAZEPRIL HCL 5-40 MG PO CAPS: 1.0000 | ORAL_CAPSULE | Freq: Every day | ORAL | 3 refills | Status: DC

## 2017-09-01 NOTE — Telephone Encounter (Signed)
[facility] pharmacy faxed a refill request for a 90-days supply for the following medication. Thanks CC  amLODipine-benazepril (LOTREL) 5-40 MG capsule

## 2017-09-20 ENCOUNTER — Encounter: Payer: Self-pay | Admitting: Family Medicine

## 2017-09-20 ENCOUNTER — Ambulatory Visit: Payer: PPO | Admitting: Family Medicine

## 2017-09-20 VITALS — BP 130/60 | HR 74 | Temp 98.0°F | Wt 275.0 lb

## 2017-09-20 DIAGNOSIS — L0233 Carbuncle of buttock: Secondary | ICD-10-CM | POA: Diagnosis not present

## 2017-09-20 DIAGNOSIS — E7800 Pure hypercholesterolemia, unspecified: Secondary | ICD-10-CM

## 2017-09-20 DIAGNOSIS — E118 Type 2 diabetes mellitus with unspecified complications: Secondary | ICD-10-CM | POA: Diagnosis not present

## 2017-09-20 MED ORDER — AMOXICILLIN 500 MG PO CAPS: 500.0000 mg | ORAL_CAPSULE | Freq: Three times a day (TID) | ORAL | 0 refills | Status: DC

## 2017-09-20 NOTE — Progress Notes (Signed)
Patient: Andrew Lyons Male    DOB: 10/19/1945   71 y.o.   MRN: [id number] Visit Date: 09/20/2017  Today's Provider: [name], MD   Chief Complaint  Patient presents with  . Diabetes  . Hyperlipidemia   Subjective:    HPI  Diabetes Mellitus Type II, Follow-up:   Lab Results  Component Value Date   HGBA1C 6.5 05/22/2017   HGBA1C 6.6 01/23/2017   HGBA1C 7.0 09/15/2016    Last seen for diabetes 4 months ago.  Management since then includes none. He reports good compliance with treatment. He is not having side effects.  Home blood sugar records: not being checked.   Episodes of hypoglycemia? no   Current Insulin Regimen: 0.6 mg Current exercise: none  Pertinent Labs:    Component Value Date/Time   CHOL 144 05/17/2016 0706   TRIG 237 (H) 05/17/2016 0706   HDL 34 (L) 05/17/2016 0706   LDLCALC 63 05/17/2016 0706   CREATININE 1.26 05/17/2016 0706    Wt Readings from Last 3 Encounters:  09/20/17 275 lb (124.7 kg)  05/22/17 (!) 365 lb (165.6 kg)  05/18/17 267 lb (121.1 kg)    ------------------------------------------------------------------------   Lipid/Cholesterol, Follow-up:   Last seen for this4 months ago.  Management changes since that visit include none. . Last Lipid Panel:    Component Value Date/Time   CHOL 144 05/17/2016 0706   TRIG 237 (H) 05/17/2016 0706   HDL 34 (L) 05/17/2016 0706   LDLCALC 63 05/17/2016 0706    He reports good compliance with treatment. He is not having side effects.   Wt Readings from Last 3 Encounters:  09/20/17 275 lb (124.7 kg)  05/22/17 (!) 365 lb (165.6 kg)  05/18/17 267 lb (121.1 kg)    ------------------------------------------------------------------- Pt also wanted to get his groin area checked because he thinks he has a boil or infection. He reports that he noticed he was chaffed in that area then it started getting sore and has a break in the skin. He reports that it started about a  week ago.      Allergies  Allergen Reactions  . Iodinated Diagnostic Agents Rash    had a red chest -unusre of what kind of dye it was  . Zyban [Bupropion]   . Zocor [Simvastatin]      Current Outpatient Medications:  .  amLODipine-benazepril (LOTREL) 5-40 MG capsule, Take 1 capsule by mouth daily., Disp: 90 capsule, Rfl: 3 .  aspirin 325 MG EC tablet, Take 325 mg by mouth daily., Disp: , Rfl:  .  B-D UF III MINI PEN NEEDLES 31G X 5 MM MISC, USE WITH PEN DAILY, Disp: 90 each, Rfl: 3 .  Blood Glucose Monitoring Suppl (ONE TOUCH ULTRA SYSTEM KIT) w/Device KIT, To use daily to check sugar. DX E11.9-needs one touch ultra meter, Disp: 1 each, Rfl: 0 .  co-enzyme Q-10 30 MG capsule, Take 100 mg by mouth daily., Disp: , Rfl:  .  empagliflozin (JARDIANCE) 25 MG TABS tablet, Take 25 mg by mouth daily., Disp: 30 tablet, Rfl: 12 .  fluticasone (FLONASE) 50 MCG/ACT nasal spray, USE 2 SPRAYS IN EACH NOSTRIL EVERY DAY, Disp: 48 g, Rfl: 12 .  glucose blood (ONE TOUCH ULTRA TEST) test strip, Check sugar 3 times daily. DX E11.9-strips for one touch ultra meter, Disp: 100 each, Rfl: 11 .  metFORMIN (GLUCOPHAGE) 500 MG tablet, Take 500 mg by mouth 2 (two) times daily., Disp: , Rfl:  .  metoprolol succinate (TOPROL-XL) 100 MG 24 hr tablet, TAKE 1 TABLET BY MOUTH EVERY DAY, Disp: 90 tablet, Rfl: 3 .  montelukast (SINGULAIR) 10 MG tablet, TAKE 1 TABLET BY MOUTH EVERY DAY, Disp: 90 tablet, Rfl: 3 .  Multiple Vitamin (MULTIVITAMIN) capsule, Take 1 capsule by mouth daily. , Disp: , Rfl:  .  omeprazole (PRILOSEC) 40 MG capsule, TAKE ONE CAPSULE BY MOUTH EVERY DAY, Disp: 90 capsule, Rfl: 3 .  ONETOUCH DELICA LANCETS FINE MISC, 1 Device by Does not apply route 3 (three) times daily., Disp: 100 each, Rfl: 11 .  rosuvastatin (CRESTOR) 20 MG tablet, TAKE 1 TABLET BY MOUTH EVERY DAY, Disp: 90 tablet, Rfl: 2 .  sildenafil (VIAGRA) 100 MG tablet, USE AS DIRECTED, Disp: , Rfl:  .  tamsulosin (FLOMAX) 0.4 MG CAPS capsule,  TAKE 1 CAPSULE (0.4 MG TOTAL) BY MOUTH DAILY., Disp: 90 capsule, Rfl: 3 .  VICTOZA 18 MG/3ML SOPN, INJECT 0.6 SUBCUTANEOUSLY EVERY DAY (Patient taking differently: INJECT 1.2 SUBCUTANEOUSLY EVERY DAY), Disp: 3 pen, Rfl: 3 .  VITAMIN C, CALCIUM ASCORBATE, PO, Take by mouth daily., Disp: , Rfl:   Review of Systems  Constitutional: Negative.   HENT: Negative.   Eyes: Negative.   Respiratory: Negative.   Cardiovascular: Negative.   Gastrointestinal: Negative.   Endocrine: Negative.   Genitourinary: Negative.   Musculoskeletal: Negative.   Skin: Negative.   Allergic/Immunologic: Negative.   Neurological: Negative.   Hematological: Negative.   Psychiatric/Behavioral: Negative.     Social History   Tobacco Use  . Smoking status: Former Research scientist (life sciences)  . Smokeless tobacco: Never Used  . Tobacco comment: Quit smoking in 2003; Started smoking at age 69, smoked about 40 years, smoked over 3 packs per day  Substance Use Topics  . Alcohol use: Yes    Comment: Occcasional- beer   Objective:   BP 130/60 (BP Location: Left Arm, Patient Position: Sitting, Cuff Size: Large)   Pulse 74   Temp 98 F (36.7 C) (Oral)   Wt 275 lb (124.7 kg)   BMI 39.46 kg/m  Vitals:   09/20/17 0903  BP: 130/60  Pulse: 74  Temp: 98 F (36.7 C)  TempSrc: Oral  Weight: 275 lb (124.7 kg)     Physical Exam  Constitutional: He is oriented to person, place, and time. He appears well-developed and well-nourished.  HENT:  Head: Normocephalic and atraumatic.  Eyes: Conjunctivae are normal. No scleral icterus.  Neck: No thyromegaly present.  Cardiovascular: Normal rate, regular rhythm and normal heart sounds.  Pulmonary/Chest: Effort normal.  Abdominal: Soft.  Neurological: He is alert and oriented to person, place, and time.  Skin: Skin is warm and dry.  Right upper leg carbuncle.  Psychiatric: He has a normal mood and affect. His behavior is normal. Judgment and thought content normal.        Assessment &  Plan:      1. Type 2 diabetes mellitus with complication, without long-term current use of insulin (HCC)  - Hemoglobin A1c  2. Pure hypercholesterolemia  - Lipid panel  3. Carbuncle of buttock  - amoxicillin (AMOXIL) 500 MG capsule; Take 1 capsule (500 mg total) by mouth 3 (three) times daily.  Dispense: 30 capsule; Refill: 0       I have done the exam and reviewed the above chart and it is accurate to the best of my knowledge. Development worker, community has been used in this note in any air is in the dictation or transcription are unintentional.  Delfino Lovett  [name], MD  [facility]

## 2017-09-21 DIAGNOSIS — E7800 Pure hypercholesterolemia, unspecified: Secondary | ICD-10-CM | POA: Diagnosis not present

## 2017-09-21 DIAGNOSIS — E118 Type 2 diabetes mellitus with unspecified complications: Secondary | ICD-10-CM | POA: Diagnosis not present

## 2017-09-22 LAB — LIPID PANEL
Cholesterol: 133 mg/dL (ref <200)
HDL: 32 mg/dL — ABNORMAL LOW (ref >40)
LDL CHOLESTEROL (CALC): 71 mg/dL
NON-HDL CHOLESTEROL (CALC): 101 mg/dL (ref <130)
TRIGLYCERIDES: 205 mg/dL — AB (ref <150)
Total CHOL/HDL Ratio: 4.2 (calc) (ref <5.0)

## 2017-09-22 LAB — HEMOGLOBIN A1C
Hgb A1c MFr Bld: 8.3 % of total Hgb — ABNORMAL HIGH (ref <5.7)
Mean Plasma Glucose: 192 (calc)
eAG (mmol/L): 10.6 (calc)

## 2017-10-02 ENCOUNTER — Telehealth: Payer: Self-pay

## 2017-10-02 NOTE — Telephone Encounter (Signed)
-----   Message from [name]., MD sent at 10/02/2017  8:13 AM EST ----- Lipids OK--work on D and E for DM.

## 2017-10-02 NOTE — Telephone Encounter (Signed)
Pt advised.   Thanks,   -Andrew Lyons  

## 2017-10-05 ENCOUNTER — Ambulatory Visit (INDEPENDENT_AMBULATORY_CARE_PROVIDER_SITE_OTHER): Payer: PPO | Admitting: Family Medicine

## 2017-10-05 ENCOUNTER — Encounter: Payer: Self-pay | Admitting: Family Medicine

## 2017-10-05 VITALS — BP 122/60 | HR 66 | Temp 97.8°F | Resp 16 | Wt 275.0 lb

## 2017-10-05 DIAGNOSIS — L02234 Carbuncle of groin: Secondary | ICD-10-CM | POA: Diagnosis not present

## 2017-10-05 NOTE — Patient Instructions (Addendum)
Take warm baths to help the area continue to improve and to prevent from getting infected again.

## 2017-10-05 NOTE — Progress Notes (Signed)
Patient: Andrew Lyons Male    DOB: 08-Sep-1946   72 y.o.   MRN: [id number] Visit Date: 10/05/2017  Today's Provider: [name], MD   Chief Complaint  Patient presents with  . Follow-up    carbuncle of buttock   Subjective:    HPI Pt is here for a 1 week follow up of a carbuncle of his buttock. He finished the antibiotic. He reports that it is feeling much better and is no longer tender but it still has a knot in the area so he wanted to follow up today.       Allergies  Allergen Reactions  . Iodinated Diagnostic Agents Rash    had a red chest -unusre of what kind of dye it was  . Zyban [Bupropion]   . Zocor [Simvastatin]      Current Outpatient Medications:  .  amLODipine-benazepril (LOTREL) 5-40 MG capsule, Take 1 capsule by mouth daily., Disp: 90 capsule, Rfl: 3 .  aspirin 325 MG EC tablet, Take 325 mg by mouth daily., Disp: , Rfl:  .  B-D UF III MINI PEN NEEDLES 31G X 5 MM MISC, USE WITH PEN DAILY, Disp: 90 each, Rfl: 3 .  Blood Glucose Monitoring Suppl (ONE TOUCH ULTRA SYSTEM KIT) w/Device KIT, To use daily to check sugar. DX E11.9-needs one touch ultra meter, Disp: 1 each, Rfl: 0 .  co-enzyme Q-10 30 MG capsule, Take 100 mg by mouth daily., Disp: , Rfl:  .  empagliflozin (JARDIANCE) 25 MG TABS tablet, Take 25 mg by mouth daily., Disp: 30 tablet, Rfl: 12 .  fluticasone (FLONASE) 50 MCG/ACT nasal spray, USE 2 SPRAYS IN EACH NOSTRIL EVERY DAY, Disp: 48 g, Rfl: 12 .  glucose blood (ONE TOUCH ULTRA TEST) test strip, Check sugar 3 times daily. DX E11.9-strips for one touch ultra meter, Disp: 100 each, Rfl: 11 .  metFORMIN (GLUCOPHAGE) 500 MG tablet, Take 500 mg by mouth 2 (two) times daily., Disp: , Rfl:  .  metoprolol succinate (TOPROL-XL) 100 MG 24 hr tablet, TAKE 1 TABLET BY MOUTH EVERY DAY, Disp: 90 tablet, Rfl: 3 .  montelukast (SINGULAIR) 10 MG tablet, TAKE 1 TABLET BY MOUTH EVERY DAY, Disp: 90 tablet, Rfl: 3 .  Multiple Vitamin (MULTIVITAMIN)  capsule, Take 1 capsule by mouth daily. , Disp: , Rfl:  .  omeprazole (PRILOSEC) 40 MG capsule, TAKE ONE CAPSULE BY MOUTH EVERY DAY, Disp: 90 capsule, Rfl: 3 .  ONETOUCH DELICA LANCETS FINE MISC, 1 Device by Does not apply route 3 (three) times daily., Disp: 100 each, Rfl: 11 .  rosuvastatin (CRESTOR) 20 MG tablet, TAKE 1 TABLET BY MOUTH EVERY DAY, Disp: 90 tablet, Rfl: 2 .  sildenafil (VIAGRA) 100 MG tablet, USE AS DIRECTED, Disp: , Rfl:  .  tamsulosin (FLOMAX) 0.4 MG CAPS capsule, TAKE 1 CAPSULE (0.4 MG TOTAL) BY MOUTH DAILY., Disp: 90 capsule, Rfl: 3 .  VICTOZA 18 MG/3ML SOPN, INJECT 0.6 SUBCUTANEOUSLY EVERY DAY (Patient taking differently: INJECT 1.2 SUBCUTANEOUSLY EVERY DAY), Disp: 3 pen, Rfl: 3 .  VITAMIN C, CALCIUM ASCORBATE, PO, Take by mouth daily., Disp: , Rfl:  .  amoxicillin (AMOXIL) 500 MG capsule, Take 1 capsule (500 mg total) by mouth 3 (three) times daily. (Patient not taking: Reported on 10/05/2017), Disp: 30 capsule, Rfl: 0  Review of Systems  Constitutional: Negative.   HENT: Negative.   Eyes: Negative.   Respiratory: Negative.   Cardiovascular: Negative.   Gastrointestinal: Negative.   Endocrine:  Negative.   Genitourinary: Negative.   Musculoskeletal: Negative.   Skin: Negative.   Allergic/Immunologic: Negative.   Neurological: Negative.   Hematological: Negative.   Psychiatric/Behavioral: Negative.     Social History   Tobacco Use  . Smoking status: Former Smoker  . Smokeless tobacco: Never Used  . Tobacco comment: Quit smoking in 2003; Started smoking at age 14, smoked about 40 years, smoked over 3 packs per day  Substance Use Topics  . Alcohol use: Yes    Comment: Occcasional- beer   Objective:   BP 122/60 (BP Location: Left Arm, Patient Position: Sitting, Cuff Size: Large)   Pulse 66   Temp 97.8 F (36.6 C) (Oral)   Resp 16   Wt 275 lb (124.7 kg)   BMI 39.46 kg/m  Vitals:   10/05/17 0820  BP: 122/60  Pulse: 66  Resp: 16  Temp: 97.8 F (36.6  C)  TempSrc: Oral  Weight: 275 lb (124.7 kg)     Physical Exam  Constitutional: He is oriented to person, place, and time. He appears well-developed and well-nourished.  Eyes: Conjunctivae and EOM are normal. Pupils are equal, round, and reactive to light.  Neck: Normal range of motion. Neck supple.  Cardiovascular: Normal rate, regular rhythm, normal heart sounds and intact distal pulses.  Pulmonary/Chest: Effort normal and breath sounds normal.  Musculoskeletal: Normal range of motion.  Neurological: He is alert and oriented to person, place, and time. He has normal reflexes.  Skin: Skin is warm and dry.  Small,nontender,nondraining carbuncle of right perineum/groin.  Psychiatric: He has a normal mood and affect. His behavior is normal. Judgment and thought content normal.        Assessment & Plan:      1. Carbuncle of groin Improved. Instructed pt to take warm tub baths to help this. 2.TIIDM     HPI, Exam, and A&P Transcribed under the direction and in the presence of  L.  Jr, MD  Electronically Signed: Brittany O'Dell, CMA  I have done the exam and reviewed the above chart and it is accurate to the best of my knowledge. Dragon  technology has been used in this note in any air is in the dictation or transcription are unintentional.    Jr, MD  [facility]  

## 2017-10-11 DIAGNOSIS — H5203 Hypermetropia, bilateral: Secondary | ICD-10-CM | POA: Diagnosis not present

## 2017-10-11 DIAGNOSIS — E119 Type 2 diabetes mellitus without complications: Secondary | ICD-10-CM | POA: Diagnosis not present

## 2017-10-11 DIAGNOSIS — H43313 Vitreous membranes and strands, bilateral: Secondary | ICD-10-CM | POA: Diagnosis not present

## 2017-10-11 LAB — HM DIABETES EYE EXAM

## 2017-10-13 DIAGNOSIS — M6530 Trigger finger, unspecified finger: Secondary | ICD-10-CM | POA: Insufficient documentation

## 2017-10-23 DIAGNOSIS — I10 Essential (primary) hypertension: Secondary | ICD-10-CM | POA: Diagnosis not present

## 2017-10-23 DIAGNOSIS — Q231 Congenital insufficiency of aortic valve: Secondary | ICD-10-CM | POA: Diagnosis not present

## 2017-10-23 DIAGNOSIS — I2510 Atherosclerotic heart disease of native coronary artery without angina pectoris: Secondary | ICD-10-CM | POA: Diagnosis not present

## 2017-10-23 DIAGNOSIS — G4733 Obstructive sleep apnea (adult) (pediatric): Secondary | ICD-10-CM | POA: Diagnosis not present

## 2017-10-23 DIAGNOSIS — E782 Mixed hyperlipidemia: Secondary | ICD-10-CM | POA: Diagnosis not present

## 2017-10-23 DIAGNOSIS — Z952 Presence of prosthetic heart valve: Secondary | ICD-10-CM | POA: Diagnosis not present

## 2017-10-25 ENCOUNTER — Other Ambulatory Visit: Payer: Self-pay | Admitting: Internal Medicine

## 2017-10-25 ENCOUNTER — Encounter: Payer: PPO | Attending: Internal Medicine | Admitting: Internal Medicine

## 2017-10-25 ENCOUNTER — Ambulatory Visit
Admission: RE | Admit: 2017-10-25 | Discharge: 2017-10-25 | Disposition: A | Discharge status: Home / Self Care | Payer: PPO | Source: Ambulatory Visit | Attending: Internal Medicine | Admitting: Internal Medicine

## 2017-10-25 ENCOUNTER — Other Ambulatory Visit
Admission: RE | Admit: 2017-10-25 | Discharge: 2017-10-25 | Disposition: A | Discharge status: Home / Self Care | Payer: PPO | Source: Ambulatory Visit | Attending: Internal Medicine | Admitting: Internal Medicine

## 2017-10-25 DIAGNOSIS — E11622 Type 2 diabetes mellitus with other skin ulcer: Secondary | ICD-10-CM | POA: Insufficient documentation

## 2017-10-25 DIAGNOSIS — G4730 Sleep apnea, unspecified: Secondary | ICD-10-CM | POA: Diagnosis not present

## 2017-10-25 DIAGNOSIS — Y835 Amputation of limb(s) as the cause of abnormal reaction of the patient, or of later complication, without mention of misadventure at the time of the procedure: Secondary | ICD-10-CM | POA: Insufficient documentation

## 2017-10-25 DIAGNOSIS — L97313 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 of muscle: Secondary | ICD-10-CM | POA: Insufficient documentation

## 2017-10-25 DIAGNOSIS — E1140 Type 2 diabetes mellitus with diabetic neuropathy, unspecified: Secondary | ICD-10-CM | POA: Insufficient documentation

## 2017-10-25 DIAGNOSIS — Z87891 Personal history of nicotine dependence: Secondary | ICD-10-CM | POA: Diagnosis not present

## 2017-10-25 DIAGNOSIS — T148XXA Other injury of unspecified body region, initial encounter: Secondary | ICD-10-CM

## 2017-10-25 DIAGNOSIS — I252 Old myocardial infarction: Secondary | ICD-10-CM | POA: Diagnosis not present

## 2017-10-25 DIAGNOSIS — T8189XA Other complications of procedures, not elsewhere classified, initial encounter: Secondary | ICD-10-CM | POA: Diagnosis not present

## 2017-10-25 DIAGNOSIS — Z794 Long term (current) use of insulin: Secondary | ICD-10-CM | POA: Diagnosis not present

## 2017-10-25 DIAGNOSIS — X58XXXD Exposure to other specified factors, subsequent encounter: Secondary | ICD-10-CM | POA: Insufficient documentation

## 2017-10-25 DIAGNOSIS — S88111D Complete traumatic amputation at level between knee and ankle, right lower leg, subsequent encounter: Secondary | ICD-10-CM | POA: Insufficient documentation

## 2017-10-25 DIAGNOSIS — M7989 Other specified soft tissue disorders: Secondary | ICD-10-CM | POA: Diagnosis not present

## 2017-10-25 DIAGNOSIS — J449 Chronic obstructive pulmonary disease, unspecified: Secondary | ICD-10-CM | POA: Diagnosis not present

## 2017-10-25 DIAGNOSIS — M1990 Unspecified osteoarthritis, unspecified site: Secondary | ICD-10-CM | POA: Diagnosis not present

## 2017-10-28 LAB — AEROBIC CULTURE W GRAM STAIN (SUPERFICIAL SPECIMEN): Culture: NORMAL

## 2017-10-28 LAB — AEROBIC CULTURE  (SUPERFICIAL SPECIMEN): GRAM STAIN: NONE SEEN

## 2017-10-29 NOTE — Progress Notes (Signed)
LOVE, [place] ([id number]) Visit Report for 10/25/2017 Abuse/Suicide Risk Screen Details Patient Name: Andrew Lyons, Andrew Lyons Date of Service: 10/25/2017 10:30 AM Medical Record Number: [id number] Patient Account Number: [id number] Date of Birth/Sex: 07-03-1946 (72 y.o. Male) Treating RN: [name], [name] Primary Care Carrisa Keller: [name] Other Clinician: Referring Abiha Lukehart: Referral, Self Treating Dayten Juba/Extender: [name] in Treatment: 0 Abuse/Suicide Risk Screen Items Answer ABUSE/SUICIDE RISK SCREEN: Has anyone close to you tried to hurt or harm you recentlyo No Do you feel uncomfortable with anyone in your familyo No Has anyone forced you do things that you didnot want to doo No Do you have any thoughts of harming yourselfo No Patient displays signs or symptoms of abuse and/or neglect. No Electronic Signature(s) Signed: 10/27/2017 4:17:47 PM By: Alric Quan Entered By: Alric Quan on 10/25/2017 10:22:06 Andrew Lyons ([id number]) -------------------------------------------------------------------------------- Activities of Daily Living Details Patient Name: Andrew Lyons Date of Service: 10/25/2017 10:30 AM Medical Record Number: [id number] Patient Account Number: [id number] Date of Birth/Sex: March 07, 1946 (72 y.o. Male) Treating RN: [name], [name] Primary Care Dareion Kneece: [name] Other Clinician: Referring Rajan Burgard: Referral, Self Treating Geetika Laborde/Extender: [name] in Treatment: 0 Activities of Daily Living Items Answer Activities of Daily Living (Please select one for each item) Drive Automobile Completely Able Take Medications Completely Able Use Telephone Completely Able Care for Appearance Completely Able Use Toilet Completely Able Bath / Shower Completely Able Dress Self Completely Able Feed Self Completely Able Walk Completely Able Get In / Out Bed Completely Able Housework Completely  Able Prepare Meals Completely Able Handle Money Completely Able Shop for Self Completely Able Electronic Signature(s) Signed: 10/27/2017 4:17:47 PM By: Alric Quan Entered By: Alric Quan on 10/25/2017 10:22:23 Andrew Lyons ([id number]) -------------------------------------------------------------------------------- Education Assessment Details Patient Name: Andrew Lyons Date of Service: 10/25/2017 10:30 AM Medical Record Number: [id number] Patient Account Number: [id number] Date of Birth/Sex: [birth year] (72 y.o. Male) Treating RN: [name], [name] Primary Care Takiyah Bohnsack: [name] Other Clinician: Referring Kasmira Cacioppo: Referral, Self Treating Brentley Horrell/Extender: [name] in Treatment: 0 Primary Learner Assessed: Patient Learning Preferences/Education Level/Primary Language Learning Preference: Explanation, Printed Material Highest Education Level: Grade School Preferred Language: English Cognitive Barrier Assessment/Beliefs Language Barrier: No Translator Needed: No Memory Deficit: No Emotional Barrier: No Cultural/Religious Beliefs Affecting Medical Care: No Physical Barrier Assessment Impaired Vision: Yes Glasses Impaired Hearing: No Decreased Hand dexterity: No Knowledge/Comprehension Assessment Knowledge Level: Medium Comprehension Level: Medium Ability to understand written Medium instructions: Ability to understand verbal Medium instructions: Motivation Assessment Anxiety Level: Calm Cooperation: Cooperative Education Importance: Acknowledges Need Interest in Health Problems: Asks Questions Perception: Coherent Willingness to Engage in Self- Medium Management Activities: Readiness to Engage in Self- Medium Management Activities: Electronic Signature(s) Signed: 10/27/2017 4:17:47 PM By: Alric Quan Entered By: Alric Quan on 10/25/2017 10:22:46 Andrew Lyons  ([id number]) -------------------------------------------------------------------------------- Fall Risk Assessment Details Patient Name: Andrew Lyons Date of Service: 10/25/2017 10:30 AM Medical Record Number: [id number] Patient Account Number: [id number] Date of Birth/Sex: [birth year] (72 y.o. Male) Treating RN: [name], [name] Primary Care Jozelyn Kuwahara: [name], Delfino Lovett Other Clinician: Referring Trevell Pariseau: Referral, Self Treating Cathren Sween/Extender: [name] in Treatment: 0 Fall Risk Assessment Items Have you had 2 or more falls in the last 12 monthso 0 No Have you had any fall that resulted in injury in the last 12 monthso 0 No FALL RISK ASSESSMENT: History of falling - immediate or within 3 months 0 No Secondary diagnosis 0 No Ambulatory aid None/bed rest/wheelchair/nurse 0  No Crutches/cane/walker 0 No Furniture 0 No IV Access/Saline Lock 0 No Gait/Training Normal/bed rest/immobile 0 No Weak 0 No Impaired 20 Yes Mental Status Oriented to own ability 0 Yes Electronic Signature(s) Signed: 10/27/2017 4:17:47 PM By: Alric Quan Entered By: Alric Quan on 10/25/2017 10:22:57 Andrew Lyons ([id number]) -------------------------------------------------------------------------------- Foot Assessment Details Patient Name: Andrew Lyons Date of Service: 10/25/2017 10:30 AM Medical Record Number: [id number] Patient Account Number: [id number] Date of Birth/Sex: May 16, 1946 (72 y.o. 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 Site Locations + = Sensation present, - = Sensation absent, C = Callus, U = Ulcer R = Redness, W = Warmth, M = Maceration, PU = Pre-ulcerative lesion F = Fissure, S = Swelling, D =  Dryness Assessment Right: Left: Other Deformity: No Prior Foot Ulcer: No Prior Amputation: No Charcot Joint: No Ambulatory Status: Gait: Electronic Signature(s) Signed: 10/27/2017 4:17:47 PM By: Alric Quan Entered By: Alric Quan on 10/25/2017 10:23:16 Andrew Lyons ([id number]) -------------------------------------------------------------------------------- Nutrition Risk Assessment Details Patient Name: Andrew Lyons Date of Service: 10/25/2017 10:30 AM Medical Record Number: [id number] Patient Account Number: [id number] Date of Birth/Sex: 1946-09-17 (72 y.o. Male) Treating RN: [name], [name] Primary Care Earlee Herald: [name], Delfino Lovett Other Clinician: Referring Kelby Adell: Referral, Self Treating Davien Malone/Extender: [name] in Treatment: 0 Height (in): 71 Weight (lbs): 275 Body Mass Index (BMI): 38.4 Nutrition Risk Assessment Items NUTRITION RISK SCREEN: I have an illness or condition that made me change the kind and/or amount of 0 No food I eat I eat fewer than two meals per day 0 No I eat few fruits and vegetables, or milk products 0 No I have three or more drinks of beer, liquor or wine almost every day 0 No I have tooth or mouth problems that make it hard for me to eat 0 No I don't always have enough money to buy the food I need 0 No I eat alone most of the time 0 No I take three or more different prescribed or over-the-counter drugs a day 1 Yes Without wanting to, I have lost or gained 10 pounds in the last six months 0 No I am not always physically able to shop, cook and/or feed myself 0 No Nutrition Protocols Good Risk Protocol 0 No interventions needed Moderate Risk Protocol Electronic Signature(s) Signed: 10/27/2017 4:17:47 PM By: Alric Quan Entered By: Alric Quan on 10/25/2017 10:23:07

## 2017-10-29 NOTE — Progress Notes (Signed)
Andrew Lyons, Andrew Lyons ([id number]) Visit Report for 10/25/2017 Chief Complaint Document Details Patient Name: Andrew Lyons, Andrew Lyons Date of Service: 10/25/2017 10:30 AM Medical Record Number: [id number] Patient Account Number: [id number] Date of Birth/Sex: 01-04-1946 (72 y.o. Male) Treating RN: [name], [name] Primary Care Provider: [name] Other Clinician: Referring Provider: Referral, Self Treating Provider/Extender: [name] in Treatment: 0 Information Obtained from: Patient Chief Complaint 01/04/17; patient is here for review of a nonhealing wound on his right leg below-knee amputation site. 10/25/17; patient is here for a review of a nonhealing wound on his right leg below-knee amputation site in roughly the same place as last time Electronic Signature(s) Signed: 10/25/2017 4:49:25 PM By: Linton Ham MD Entered By: Linton Ham on 10/25/2017 11:06:13 Andrew Lyons ([id number]) -------------------------------------------------------------------------------- Debridement Details Patient Name: Andrew Lyons Date of Service: 10/25/2017 10:30 AM Medical Record Number: [id number] Patient Account Number: [id number] Date of Birth/Sex: Dec 09, 1945 (72 y.o. Male) Treating RN: [name], [name] Primary Care Provider: [name], Delfino Lovett Other Clinician: Referring Provider: Referral, Self Treating Provider/Extender: [name] in Treatment: 0 Debridement Performed for Wound #2 Right Amputation Site - Below Knee Assessment: Performed By: Physician [name], MD Debridement: Debridement Pre-procedure Verification/Time Yes - 10:28 Out Taken: Start Time: 10:29 Pain Control: Lidocaine 4% Topical Solution Level: Skin/Subcutaneous Tissue Total Area Debrided (L x W): 0.1 (cm) x 0.1 (cm) = 0.01 (cm) Tissue and other material Viable, Non-Viable, Exudate, Fibrin/Slough, Subcutaneous debrided: Instrument: Curette Specimen: Swab Number  of Specimens Taken: 1 Bleeding: Minimum Hemostasis Achieved: Pressure End Time: 10:34 Procedural Pain: 0 Post Procedural Pain: 0 Response to Treatment: Procedure was tolerated well Post Debridement Measurements of Total Wound Length: (cm) 0.5 Width: (cm) 0.7 Depth: (cm) 0.8 Volume: (cm) 0.22 Character of Wound/Ulcer Post Debridement: Requires Further Debridement Post Procedure Diagnosis Same as Pre-procedure Electronic Signature(s) Signed: 10/25/2017 4:49:25 PM By: Linton Ham MD Signed: 10/27/2017 4:17:47 PM By: Alric Quan Entered By: Linton Ham on 10/25/2017 11:05:30 Andrew Lyons ([id number]) -------------------------------------------------------------------------------- HPI Details Patient Name: Andrew Lyons Date of Service: 10/25/2017 10:30 AM Medical Record Number: [id number] Patient Account Number: [id number] Date of Birth/Sex: December 21, 1945 (72 y.o. Male) Treating RN: [name], [name] Primary Care Provider: [name] Other Clinician: Referring Provider: Referral, Self Treating Provider/Extender: [name] in Treatment: 0 History of Present Illness HPI Description: 01/04/17; this is a 72 year old diabetic man who has a remote history of a traumatic lower extremity damage at age 89 requiring an amputation. He tells me spent 2 years walking on crutches then ultimately has been walking on prosthesis without any trouble since then. Several months ago he had a new prosthesis and developed an open area in the right stump in December. He saw podiatry Dr. Prudence Davidson who noted that this was really out of his practice jurisdiction and referred him here. He has been using mupirocin. He has continued to walk on the prosthesis. The prosthesis itself as been adjusted by the prostatitis. He does not have a known arterial issue. He tells me he probably has diabetic neuropathy. He had a wound that took a long time to heal surrounding the actual  amputation itself however is not had more recent wounds. 01/10/17 Culture from last week grew pseudomonas. change AB to cefdinir yesterday from doxy. he is not using his prosthesis which I emphasized although he is having it adjusted next week 01/17/17; he is completing his antibiotics in the next day or 2. We've been using silver alginate. His prosthesis is  away being readjusted. He had an x-ray of the underlying bone here apparently at his podiatrist's office although I'll need to have a look at that and we can't find the result then he may need this re-x-rayed. 01/24/17; he has completed his antibiotics. Change to Silver collagen last week. No real change in the wound this week. 01/31/17 repeat culture I did last week showed a few Pseudomonas. This only has intermediate sensitivity to quinolones I therefore put him back on a week of Cefdinir. X-ray of the stump did not show any bone destruction. We are attempting to get vascular studies done through Dr.Arida's office on May 23. 02/07/17; arterial studies are booked for May 23 no major change. He has completed his antibiotics 02/14/17 arterial studies on Thursday 5/23. using collagen 02/21/17; the patient had his arterial studies. Both his ABIs were good at 1.0 on the right and 1.1 on the left. His abdominal and iliac arteries revealed atherosclerosis without focal stenosis duplex imaging of the right lower leg revealed heterogeneous plaque and biphasic Doppler waveforms from the right CFA to the popliteal artery. The right distal SFA and tibial peroneal trunk velocities were elevated. 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 The proximal right ATA and PTA were patent above the amputation level. We have been using Silver collagen with some improvement in the wound dimensions 02/28/17; we went ahead and did TCOM measurements on this patient. One of the leads was nonfunctional however on the right leg both the proximal and periwound leads  registered greater than 70 mmHg oxygen tension. Interestingly the comparison lead on the left leg/relative wound level was in the mid 40s. This allays any fears I had about contributing wound ischemia. In the meantime is measurements today were 0.2 x 0.4 x 0.7 the wound is now a small divot most of it appears to be epithelialized with only a small open area at that depth. There is no evidence of surrounding infection 03/07/17; continued improvement down to 0.3 cm in depth. This is a very tiny circular opening. No drainage no tenderness. Looks like it is been progressing towards closure. He has been using Silver collagen although I think this is too small to use this as a primary dressing. 03/14/17; still 0.3 cm in depth tiny circular wound. No drainage or tenderness. Used Iodosorb ointment starting last week 03/21/17; still the depth and this wound. Using a #3 curet I remove necrotic material from the wound base. There is a divot here. Not making enough improvement on the Iodosorb, I change back to silver collagen this week. 03/28/17; depth down to 0.2. I cannot actually see a surface of this wound. We've been using silver collagen which I will continue 04/04/17 on evaluation today patient appears to be doing very well. In fact his wound appears to be completely healed though there is a slight eschar over the area I see no fluctuance underneath and I am reluctant to attempt scraping on the area due to the very center superficial nature of the eschar. Patient has no discomfort. 04/11/17; the patient went back to [facility] to his prosthetist however his leg was too swollen on the right deficient into the prosthesis. The wound itself on the tip of his stump on the right is closed and was close last week. I see no reason to be concerned about this. ANDRUW, BATTIE ([id number]) READMISSION 10/25/17; this is a patient that we had in clinic from April to July 2018 with a probing tiny open area  on his  right below sided below-knee amputation site. X-ray of this area did not show osteomyelitis. I had some concern about this being ischemic for a while although we did ABIs and T Commons that did a lot to allay these concerns. The patient is a type II diabetic although I don't have a recent hemoglobin A1c. He tells me that after he left the clinic last time he got a new prosthesis, not the prosthesis that originally caused wounds in this area. He states about a month ago used noted some dark discoloration in his skin or at least his wife did when he was getting out of the shower. He since has noted increasing pain and tenderness when he walks around this area. He arrives in clinic with no open wound Electronic Signature(s) Signed: 10/25/2017 4:49:25 PM By: Linton Ham MD Entered By: Linton Ham on 10/25/2017 11:10:10 Andrew Lyons ([id number]) -------------------------------------------------------------------------------- Physical Exam Details Patient Name: Andrew Lyons Date of Service: 10/25/2017 10:30 AM Medical Record Number: [id number] Patient Account Number: [id number] Date of Birth/Sex: Apr 10, 1946 (72 y.o. Male) Treating RN: [name], [name] Primary Care Provider: [name] Other Clinician: Referring Provider: Referral, Self Treating Provider/Extender: [name] in Treatment: 0 Eyes Conjunctivae clear. No discharge. Respiratory Respiratory effort is easy and symmetric bilaterally. Rate is normal at rest and on room air.. Cardiovascular His femoral pulses palpable but just like last time I can't feel his popliteal pulse. Is no edema in the right below knee. Lymphatic None palpable in the right popliteal or inguinal area. Integumentary (Hair, Skin) No rashes seen. Notes Wound exam; the patient has a distal below-knee amputation. On the distal aspect of his stump medially and on the weightbearing surface was a discolored blackened area which  appeared to be mostly subdermal. Using a #5 curet gently remove the surface of this however this expressed purulent drainage which I cultured. With more aggressive debridement this same type of probing hole as last time was identified over there is no palpable bone. No surrounding erythema. Other than the purulent drainage there was no other obvious signs of infection Electronic Signature(s) Signed: 10/25/2017 4:49:25 PM By: Linton Ham MD Entered By: Linton Ham on 10/25/2017 11:12:42 Andrew Lyons ([id number]) -------------------------------------------------------------------------------- Physician Orders Details Patient Name: Andrew Lyons Date of Service: 10/25/2017 10:30 AM Medical Record Number: [id number] Patient Account Number: [id number] Date of Birth/Sex: 1945-10-21 (72 y.o. Male) Treating RN: [name], [name] Primary Care Provider: [name] Other Clinician: Referring Provider: Referral, Self Treating Provider/Extender: [name] in Treatment: 0 Verbal / Phone Orders: Yes Clinician: Pinkerton, [name] Read Back and Verified: Yes Diagnosis Coding Wound Cleansing Wound #2 Right Amputation Site - Below Knee o Clean wound with Normal Saline. o Cleanse wound with mild soap and water o May Shower, gently pat wound dry prior to applying new dressing. Anesthetic (add to Medication List) Wound #2 Right Amputation Site - Below Knee o Topical Lidocaine 4% cream applied to wound bed prior to debridement (In Clinic Only). Skin Barriers/Peri-Wound Care Wound #2 Right Amputation Site - Below Knee o Skin Prep Primary Wound Dressing Wound #2 Right Amputation Site - Below Knee o Silvercel Non-Adherent - rope Secondary Dressing Wound #2 Right Amputation Site - Below Knee o Boardered Foam Dressing Dressing Change Frequency Wound #2 Right Amputation Site - Below Knee o Change dressing every other day. Follow-up Appointments Wound  #2 Right Amputation Site - Below Knee o Return Appointment in 1 week. Off-Loading Wound #2 Right Amputation  Site - Below Knee o Other: - keep pressure of area Additional Orders / Instructions Wound #2 Right Amputation Site - Below Knee o Increase protein intake. Laboratory Andrew Lyons, Andrew Lyons ([id number]) o Bacteria identified in Wound by Culture (MICRO) oooo LOINC Code: [id number] oooo Convenience Name: Wound culture routine Radiology o X-ray, other - right amputation site below the knee Patient Medications Allergies: Zyban, Zocor Notifications Medication Indication Start End lidocaine DOSE 1 - topical 4 % cream - 1 cream topical Electronic Signature(s) Signed: 10/25/2017 4:49:25 PM By: Linton Ham MD Signed: 10/27/2017 4:17:47 PM By: Alric Quan Entered By: Alric Quan on 10/25/2017 10:56:57 Andrew Lyons ([id number]) -------------------------------------------------------------------------------- Prescription 10/25/2017 Patient Name: Andrew Lyons Provider: [name] MD Date of Birth: 14-Nov-1945 NPI#: [id number] Sex: M DEA#: [id number] Phone #: [phone] License #: [id number] Patient Address: [facility] [place], [place] [address] [address], [place], [place] [address] [phone] Allergies Zyban Zocor Medication Medication: Route: Strength: Form: lidocaine 4 % topical cream topical 4% cream Class: TOPICAL LOCAL ANESTHETICS Dose: Frequency / Time: Indication: 1 1 cream topical Number of Refills: Number of Units: 0 Generic Substitution: Start Date: End Date: One Time Use: Substitution Permitted No Note to Pharmacy: Signature(s): Date(s): Electronic Signature(s) Signed: 10/25/2017 4:49:25 PM By: Linton Ham MD Signed: 10/27/2017 4:17:47 PM By: Alric Quan Entered By: Alric Quan on 10/25/2017  10:56:57 Andrew Lyons ([id number] Lyons ([id number]) --------------------------------------------------------------------------------  Problem List Details Patient Name: Andrew Lyons Date of Service: 10/25/2017 10:30 AM Medical Record Number: [id number] Patient Account Number: [id number] Date of Birth/Sex: [birth year] (72 y.o. 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 Male) Treating RN: [name], [name] Primary Care Provider: [name] Other Clinician: Referring Provider: Referral, Self Treating Provider/Extender: [name] in Treatment: 0 Subjective Chief Complaint Information obtained from Patient 01/04/17; patient is here for review of a nonhealing wound on his right leg below-knee amputation site. 10/25/17; patient is here for a review of a nonhealing wound on his right leg below-knee amputation site in roughly the same place as last time History of Present  Illness (HPI) 01/04/17; this is a 71 year old diabetic man who has a remote history of a traumatic lower extremity damage at age 28 requiring an amputation. He tells me spent 2 years walking on crutches then ultimately has been walking on prosthesis without any trouble since then. Several months ago he had a new prosthesis and developed an open area in the right stump in December. He saw podiatry Dr. Prudence Davidson who noted that this was really out of his practice jurisdiction and referred him here. He has been using mupirocin. He has continued to walk on the prosthesis. The prosthesis itself as been adjusted by  the prostatitis. He does not have a known arterial issue. He tells me he probably has diabetic neuropathy. He had a wound that took a long time to heal surrounding the actual amputation itself however is not had more recent wounds. 01/10/17 Culture from last week grew pseudomonas. change AB to cefdinir yesterday from doxy. he is not using his prosthesis which I emphasized although he is having it adjusted next week 01/17/17; he is completing his antibiotics in the next day or 2. We've been using silver alginate. His prosthesis is away being readjusted. He had an x-ray of the underlying bone here apparently at his podiatrist's office although I'll need to have a look at that and we can't find the result then he may need this re-x-rayed. 01/24/17; he has completed his antibiotics. Change to Silver collagen last week. No real change in the wound this week. 01/31/17 repeat culture I did last week showed a few Pseudomonas. This only has intermediate sensitivity to quinolones I therefore put him back on a week of Cefdinir. X-ray of the stump did not show any bone destruction. We are attempting to get vascular studies done through Dr.Arida's office on May 23. 02/07/17; arterial studies are booked for May 23 no major change. He has completed his antibiotics 02/14/17 arterial studies on Thursday 5/23. using  collagen 02/21/17; the patient had his arterial studies. Both his ABIs were good at 1.0 on the right and 1.1 on the left. His abdominal and iliac arteries revealed atherosclerosis without focal stenosis duplex imaging of the right lower leg revealed heterogeneous plaque and biphasic Doppler waveforms from the right CFA to the popliteal artery. The right distal SFA and tibial peroneal trunk velocities were elevated. 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 The proximal right ATA and PTA were patent above the amputation level. We have been using Silver collagen with some improvement in the wound dimensions 02/28/17; we went ahead and did TCOM measurements on this patient. One of the leads was nonfunctional however on the right leg both the proximal and periwound leads registered greater than 70 mmHg oxygen tension. Interestingly the comparison lead on the left leg/relative wound level was in the mid 40s. This allays any fears I had about contributing wound ischemia. In the meantime is measurements today were 0.2 x 0.4 x 0.7 the wound is now a small divot most of it appears to be epithelialized with only a small open area at that depth. There is no evidence of surrounding infection 03/07/17; continued improvement down to 0.3 cm in depth. This is a very tiny circular opening. No drainage no tenderness. Looks like it is been progressing towards closure. He has been using Silver collagen although I think this is too small to use this as a primary dressing. 03/14/17; still 0.3 cm in depth tiny circular wound. No drainage or tenderness. Used Iodosorb ointment starting last week 03/21/17; still the depth and this wound. Using a #3 curet I remove necrotic material from the wound base. There is a divot here. Not making enough improvement on the Iodosorb, I change back to silver collagen this week. 03/28/17; depth down to 0.2. I cannot actually see a surface of this wound. We've been using silver  collagen which I will Andrew Lyons, Andrew Lyons ([id number]) continue 04/04/17 on evaluation today patient appears to be doing very well. In fact his wound appears to be completely healed though there is a slight eschar over the area I see no fluctuance underneath and I am reluctant  to attempt scraping on the area due to the very center superficial nature of the eschar. Patient has no discomfort. 04/11/17; the patient went back to [facility] to his prosthetist however his leg was too swollen on the right deficient into the prosthesis. The wound itself on the tip of his stump on the right is closed and was close last week. I see no reason to be concerned about this. READMISSION 10/25/17; this is a patient that we had in clinic from April to July 2018 with a probing tiny open area on his right below sided below-knee amputation site. X-ray of this area did not show osteomyelitis. I had some concern about this being ischemic for a while although we did ABIs and T Commons that did a lot to allay these concerns. The patient is a type II diabetic although I don't have a recent hemoglobin A1c. He tells me that after he left the clinic last time he got a new prosthesis, not the prosthesis that originally caused wounds in this area. He states about a month ago used noted some dark discoloration in his skin or at least his wife did when he was getting out of the shower. He since has noted increasing pain and tenderness when he walks around this area. He arrives in clinic with no open wound Wound History Patient presents with 1 open wound that has been present for approximately 1 month. Patient has been treating wound in the following manner: none. Laboratory tests have not been performed in the last month. Patient reportedly has not tested positive for an antibiotic resistant organism. Patient reportedly has not tested positive for osteomyelitis. Patient reportedly has had testing performed to evaluate  circulation in the legs. Patient experiences the following problems associated with their wounds: swelling. Patient History Information obtained from Patient. Allergies Zyban, Zocor Family History Diabetes - Father, Heart Disease - Mother,Father, Hypertension - Father,Mother, No family history of Cancer, Hereditary Spherocytosis, Kidney Disease, Lung Disease, Seizures, Stroke, Thyroid Problems, Tuberculosis. Social History Former smoker - quit 15 yrs ago, Marital Status - Married, Alcohol Use - Rarely, Drug Use - No History, Caffeine Use - Daily. Objective Constitutional Vitals Time Taken: 10:19 AM, Height: 71 in, Source: Stated, Weight: 275 lbs, Source: Measured, BMI: 38.4. Eyes Conjunctivae clear. No discharge. Andrew Lyons, Andrew Lyons ([id number]) Respiratory Respiratory effort is easy and symmetric bilaterally. Rate is normal at rest and on room air.. Cardiovascular His femoral pulses palpable but just like last time I can't feel his popliteal pulse. Is no edema in the right below knee. Lymphatic None palpable in the right popliteal or inguinal area. General Notes: Wound exam; the patient has a distal below-knee amputation. On the distal aspect of his stump medially and on the weightbearing surface was a discolored blackened area which appeared to be mostly subdermal. Using a #5 curet gently remove the surface of this however this expressed purulent drainage which I cultured. With more aggressive debridement this same type of probing hole as last time was identified over there is no palpable bone. No surrounding erythema. Other than the purulent drainage there was no other obvious signs of infection Integumentary (Hair, Skin) No rashes seen. Wound #2 status is Open. Original cause of wound was Gradually Appeared. 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 The wound measures 0.1cm length x 0.1cm width x 0.1cm depth; 0.008cm^2 area and 0.001cm^3 volume. There is no  tunneling or undermining noted. There is a large amount of serosanguineous drainage noted. The  wound margin is distinct with the outline attached to the wound base. There is no granulation within the wound bed. There is a large (67- 100%) amount of necrotic tissue within the wound bed including Eschar. The periwound skin appearance exhibited: Callus. Periwound temperature was noted as No Abnormality. The periwound has tenderness on palpation. 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 There was a Skin/Subcutaneous Tissue Debridement ([id number]) debridement with total area of 0.01 sq cm performed by [name], MD. with the following instrument(s): Curette to remove Viable and Non-Viable tissue/material including Exudate, Fibrin/Slough, and Subcutaneous after achieving pain control using Lidocaine 4% Topical Solution. 1 Specimen was taken by a Swab and sent to the lab per facility protocol.A time out was conducted at 10:28, prior to the start of the procedure. A Minimum amount of bleeding was controlled with Pressure. The procedure was tolerated well with a pain level of 0 throughout and a pain level of 0 following the procedure. Post Debridement Measurements: 0.5cm length x 0.7cm width x 0.8cm depth; 0.22cm^3 volume. Character of Wound/Ulcer Post Debridement requires further debridement. 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 Cleanse wound with mild soap and water May Shower,  gently pat wound dry prior to applying new dressing. 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 Laboratory ordered were: Wound culture routine Radiology ordered were: X-ray, other - right amputation site below the knee The following medication(s) was prescribed: lidocaine topical 4 % cream 1 1 cream topical was prescribed at facility #1 unfortunately this is in the same location as the difficult wound last time. Purulent drainage obtained and sent for culture although no empiric antibiotics for the moment note #2 plain x-ray of the stump will be ordered to compare with the last time he was here at which time there was not evidence of osteomyelitis #3 he had extensive arterial evaluation last time and I came to the conclusion that there was no circulation issues contributing to this #4 he will need to offload this as aggressively as last time [used a scooter] Andrew Lyons, Andrew T. ([id number]) #5 the reason for this reoccurrence isn't really clear. This could be unrelieved pressure on the medial part of the weightbearing surface the cause and opening and secondary infection or this could've been  primarily infected. Electronic Signature(s) Signed: 10/25/2017 4:49:25 PM By: Linton Ham MD  Entered By: Linton Ham on 10/25/2017 11:14:43 Andrew Lyons ([id number]) -------------------------------------------------------------------------------- ROS/PFSH Details Patient Name: Andrew Lyons Date of Service: 10/25/2017 10:30 AM Medical Record Number: [id number] Patient Account Number: [id number] Date of Birth/Sex: Dec 28, 1945 (72 y.o. Male) Treating RN: [name], [name] Primary Care Provider: [name] Other Clinician: Referring Provider: Referral, Self Treating Provider/Extender: [name] in Treatment: 0 Information Obtained From Patient Wound History Do you currently have one or more open woundso Yes How many open wounds do you currently haveo 1 Approximately how long have you had your woundso 1 month How have you been treating your wound(s) until nowo none Has your wound(s) ever healed and then re-openedo No Have you had any lab work done in the past montho No Have you tested positive for an antibiotic resistant organism (MRSA, VRE)o No Have you tested positive for osteomyelitis (bone infection)o No Have you had any tests for circulation on your legso Yes Where was the test doneo [place] [facility] Have you had other problems associated with your woundso Swelling Eyes Medical History: Positive for: Cataracts Respiratory Medical History: Positive for: Chronic Obstructive Pulmonary Disease (COPD); Sleep Apnea Cardiovascular Medical History: Positive for: Hypotension; Myocardial Infarction - 2006 Endocrine Medical History: Positive for: Type II Diabetes Time with diabetes: 6 yrs Treated with: Insulin, Oral agents Blood sugar tested every day: No Musculoskeletal Medical History: Positive for: Osteoarthritis Neurologic Medical History: Positive for: Neuropathy Andrew Lyons, Andrew Lyons ([id number]) HBO Extended History  Items Eyes: Cataracts Immunizations Pneumococcal Vaccine: Received Pneumococcal Vaccination: Yes Implantable Devices Family and Social History Cancer: No; Diabetes: Yes - Father; Heart Disease: Yes - Mother,Father; Hereditary Spherocytosis: No; Hypertension: Yes - Father,Mother; Kidney Disease: No; Lung Disease: No; Seizures: No; Stroke: No; Thyroid Problems: No; Tuberculosis: No; Former smoker - quit 15 yrs ago; Marital Status - Married; Alcohol Use: Rarely; Drug Use: No History; Caffeine Use: Daily; Financial Concerns: No; Food, Clothing or Shelter Needs: No; Support System Lacking: No; Transportation Concerns: No; Advanced Directives: No; Patient does not want information on Advanced Directives; Do not resuscitate: No; Living Will: No; Medical Power of Attorney: No Electronic Signature(s) Signed: 10/25/2017 4:49:25 PM By: Linton Ham MD Signed: 10/27/2017 4:17:47 PM By: Alric Quan Entered By: Alric Quan on 10/25/2017 10:21:59 Andrew Lyons ([id number]) -------------------------------------------------------------------------------- SuperBill Details Patient Name: Andrew Lyons Date of Service: 10/25/2017 Medical Record Number: [id number] Patient Account Number: [id number] Date of Birth/Sex: 08-Feb-1946 (72 y.o. 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, right lower leg, subsequent encounter Facility Procedures CPT4 Code: 31497026 Description: [place] - DEB SUBQ TISSUE 20 SQ CM/< ICD-10 Diagnosis Description V78.588 Non-pressure chronic ulcer of right ankle with  necrosis of Modifier: muscle Quantity: 1 Physician Procedures CPT4 Code: 5027741 Description: 28786 - WC PHYS LEVEL 3 - EST PT ICD-10 Diagnosis Description E11.622 Type 2 diabetes mellitus with other skin ulcer L97.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

## 2017-10-29 NOTE — Progress Notes (Signed)
EMERIC, [name] ([id number]) Visit Report for 10/25/2017 Allergy List Details Patient Name: Andrew Lyons, Andrew Lyons Date of Service: 10/25/2017 10:30 AM Medical Record Number: [id number] Patient Account Number: [id number] Date of Birth/Sex: 1945-10-20 (72 y.o. Male) Treating RN: [name] Primary Care [name]: [name] Other Clinician: Referring [name]: Referral, Self Treating [name]/Extender: [name] in Treatment: 0 Allergies Active Allergies Zyban Zocor Allergy Notes Electronic Signature(s) Signed: 10/27/2017 4:17:47 PM By: Alric Quan Entered By: Alric Quan on 10/25/2017 10:24:25 Andrew Lyons ([id number]) -------------------------------------------------------------------------------- Arrival Information Details Patient Name: Andrew Lyons Date of Service: 10/25/2017 10:30 AM Medical Record Number: [id number] Patient Account Number: [id number] Date of Birth/Sex: [birth year] (72 y.o. Male) Treating RN: [name], [name] Primary Care [name]: [name] Other Clinician: Referring [name]: Referral, Self Treating [name]/Extender: [name] in Treatment: 0 Visit Information Patient Arrived: Ambulatory Arrival Time: 10:18 Accompanied By: self Transfer Assistance: None Patient Identification Verified: Yes Secondary Verification Process Completed: Yes Patient Requires Transmission-Based No Precautions: Patient Has Alerts: Yes Patient Alerts: DM II History Since Last Visit All ordered tests and consults were completed: No Added or deleted any medications: No Any new allergies or adverse reactions: No Had a fall or experienced change in activities of daily living that may affect risk of falls: No Signs or symptoms of abuse/neglect since last visito No Hospitalized since last visit: No Has Dressing in Place as Prescribed: Yes Pain Present Now: No Electronic Signature(s) Signed: 10/27/2017 4:17:47  PM By: Alric Quan Entered By: Alric Quan on 10/25/2017 10:19:32 Andrew Lyons ([id number]) -------------------------------------------------------------------------------- Encounter Discharge Information Details Patient Name: Andrew Lyons Date of Service: 10/25/2017 10:30 AM Medical Record Number: [id number] Patient Account Number: [id number] Date of Birth/Sex: [birth year] (72 y.o. Male) Treating RN: [name], [name] Primary Care Keelyn Fjelstad: [name] Other Clinician: Referring [name]: Referral, Self Treating [name]/Extender: [name] in Treatment: 0 Encounter Discharge Information Items Discharge Pain Level: 0 Discharge Condition: Stable Ambulatory Status: Ambulatory Discharge Destination: Home Private Transportation: Auto Accompanied By: self Schedule Follow-up Appointment: Yes Medication Reconciliation completed and provided No to Patient/Care [name]: Clinical Summary of Care: Electronic Signature(s) Signed: 10/27/2017 4:17:47 PM By: Alric Quan Entered By: Alric Quan on 10/25/2017 10:52:10 Andrew Lyons ([id number]) -------------------------------------------------------------------------------- Lower Extremity Assessment Details Patient Name: Andrew Lyons Date of Service: 10/25/2017 10:30 AM Medical Record Number: [id number] Patient Account Number: [id number] Date of Birth/Sex: 06-16-1946 (72 y.o. Male) Treating RN: [name], [name] Primary Care Ilir Mahrt: [name] Other Clinician: Referring Kiowa Peifer: Referral, Self Treating Ambriel Gorelick/Extender: [name] in Treatment: 0 Edema Assessment Assessed: [Left: No] [Right: No] Edema: [Left: N] [Right: o] Vascular Assessment Pulses: Dorsalis Pedis Palpable: [Right:No] Doppler Audible: [Right:Inaudible] Posterior Tibial Palpable: [Right:No] Popliteal Palpable: [Right:No] Extremity colors, hair growth, and conditions: Extremity  Color: [Right:Normal] Temperature of Extremity: [Right:Warm] Capillary Refill: [Right:< 3 seconds] Electronic Signature(s) Signed: 10/25/2017 4:37:56 PM By: Alric Quan Entered By: Alric Quan on 10/25/2017 16:37:55 Andrew Lyons ([id number]) -------------------------------------------------------------------------------- Multi Wound Chart Details Patient Name: Andrew Lyons Date of Service: 10/25/2017 10:30 AM Medical Record Number: [id number] Patient Account Number: [id number] Date of Birth/Sex: March 10, 1946 (72 y.o. Male) Treating RN: [name], [name] Primary Care [name]: [name] Other Clinician: Referring [name]: Referral, Self Treating [name]/Extender: [name] in Treatment: 0 Photos: [2:No Photos] [N/A:N/A] Wound Location: [2:Right Amputation Site - Below N/A Knee] Wounding Event: [2:Gradually Appeared] [N/A:N/A] Primary Etiology: [2:Pressure Ulcer] [N/A:N/A] Comorbid History: [2:Cataracts, Chronic Obstructive N/A Pulmonary Disease (COPD), Sleep Apnea, Hypotension,  Myocardial Infarction, Type II Diabetes, Osteoarthritis, Neuropathy] Date Acquired: [2:09/24/2017] [N/A:N/A] Weeks of Treatment: [2:0] [N/A:N/A] Wound Status: [2:Open] [N/A:N/A] Measurements L x W x D [2:0.1x0.1x0.1] [N/A:N/A] (cm) Area (cm) : [2:0.008] [N/A:N/A] Volume (cm) : [2:0.001] [N/A:N/A] % Reduction in Area: [2:0.00%] [N/A:N/A] % Reduction in Volume: [2:0.00%] [N/A:N/A] Classification: [2:Category/Stage III] [N/A:N/A] Exudate Amount: [2:Large] [N/A:N/A] Exudate Type: [2:Serosanguineous] [N/A:N/A] Exudate Color: [2:red, brown] [N/A:N/A] Wound Margin: [2:Distinct, outline attached] [N/A:N/A] Granulation Amount: [2:None Present (0%)] [N/A:N/A] Necrotic Amount: [2:Large (67-100%)] [N/A:N/A] Necrotic Tissue: [2:Eschar] [N/A:N/A] Epithelialization: [2:None] [N/A:N/A] Debridement: [2:Debridement ([id number])] [N/A:N/A] Pre-procedure [2:10:28]  [N/A:N/A] Verification/Time Out Taken: Pain Control: [2:Lidocaine 4% Topical Solution] [N/A:N/A] Tissue Debrided: [2:Fibrin/Slough, Exudates, Subcutaneous] [N/A:N/A] Level: [2:Skin/Subcutaneous Tissue] [N/A:N/A] Debridement Area (sq cm): [2:0.01] [N/A:N/A] Instrument: [2:Curette] [N/A:N/A] Specimen: [2:Swab] [N/A:N/A] Number of Specimens [2:1] [N/A:N/A] Taken: Bleeding: [2:Minimum] [N/A:N/A] Hemostasis Achieved: [2:Pressure] [N/A:N/A] Procedural Pain: [2:0] [N/A:N/A] Post Procedural Pain: [2:0] [N/A:N/A] Debridement Treatment Procedure was tolerated well N/A N/A Response: Post Debridement 0.5x0.7x0.8 N/A N/A Measurements L x W x D (cm) Post Debridement Volume: 0.22 N/A N/A (cm) Periwound Skin Texture: Callus: Yes N/A N/A Periwound Skin Moisture: No Abnormalities Noted N/A N/A Periwound Skin Color: No Abnormalities Noted N/A N/A Temperature: No Abnormality N/A N/A Tenderness on Palpation: Yes N/A N/A Wound Preparation: Ulcer Cleansing: N/A N/A Rinsed/Irrigated with Saline Topical Anesthetic Applied: None, Other: lidocaine 4% Procedures Performed: Debridement N/A N/A Treatment Notes Wound #2 (Right Amputation Site - Below Knee) 1. Cleansed with: Clean wound with Normal Saline 2. Anesthetic Topical Lidocaine 4% cream to wound bed prior to debridement 3. Peri-wound Care: Skin Prep 4. Dressing Applied: Other dressing (specify in notes) 5. Secondary Dressing Applied Bordered Foam Dressing Notes silvercel rope Electronic Signature(s) Signed: 10/25/2017 4:49:25 PM By: Linton Ham MD Previous Signature: 10/25/2017 10:58:16 AM Version By: Alric Quan Entered By: Linton Ham on 10/25/2017 11:03:53 Andrew Lyons ([id number]) -------------------------------------------------------------------------------- Multi-Disciplinary Care Plan Details Patient Name: Andrew Lyons Date of Service: 10/25/2017 10:30 AM Medical Record Number: [id number] Patient  Account Number: [id number] Date of Birth/Sex: [birth year] (72 y.o. Male) Treating RN: [name], [name] Primary Care Vinessa Macconnell: [name] Other Clinician: Referring Keionna Kinnaird: Referral, Self Treating Nadya Hopwood/Extender: [name] in Treatment: 0 Active Inactive ` Nutrition Nursing Diagnoses: Imbalanced nutrition Impaired glucose control: actual or potential Goals: Patient/caregiver will maintain therapeutic glucose control Date Initiated: 10/25/2017 Target Resolution Date: 12/30/2017 Goal Status: Active Interventions: Assess patient nutrition upon admission and as needed per policy Provide education on elevated blood sugars and impact on wound healing Notes: ` Orientation to the Wound Care Program Nursing Diagnoses: Knowledge deficit related to the wound healing center program Goals: Patient/caregiver will verbalize understanding of the [place] Date Initiated: 10/25/2017 Target Resolution Date: 11/04/2017 Goal Status: Active Interventions: Provide education on orientation to the wound center Notes: ` Pressure Nursing Diagnoses: Knowledge deficit related to causes and risk factors for pressure ulcer development Knowledge deficit related to management of pressures ulcers Potential for impaired tissue integrity related to pressure, friction, moisture, and shear Goals: Patient will remain free from development of additional pressure ulcers Andrew Lyons, Andrew Lyons ([id number]) Date Initiated: 10/25/2017 Target Resolution Date: 02/03/2018 Goal Status: Active Interventions: Assess: immobility, friction, shearing, incontinence upon admission and as needed Assess offloading mechanisms upon admission and as needed Assess potential for pressure ulcer upon admission and as needed Notes: ` Wound/Skin Impairment Nursing Diagnoses: Impaired tissue integrity Knowledge deficit related to ulceration/compromised skin integrity Goals: Ulcer/skin breakdown  will have a volume reduction of 80% by week 12  Date Initiated: 10/25/2017 Target Resolution Date: 01/27/2018 Goal Status: Active Interventions: Assess patient/caregiver ability to perform ulcer/skin care regimen upon admission and as needed Assess ulceration(s) every visit Notes: Electronic Signature(s) Signed: 10/25/2017 10:58:05 AM By: Alric Quan Entered By: Alric Quan on 10/25/2017 10:58:04 Andrew Lyons ([id number]) -------------------------------------------------------------------------------- Pain Assessment Details Patient Name: Andrew Lyons Date of Service: 10/25/2017 10:30 AM Medical Record Number: [id number] Patient Account Number: [id number] Date of Birth/Sex: July 15, 1946 (72 y.o. Male) Treating RN: [name], [name] Primary Care Eadie Repetto: [name] Other Clinician: Referring Gracyn Santillanes: Referral, Self Treating Tiny Chaudhary/Extender: [name] in Treatment: 0 Active Problems Location of Pain Severity and Description of Pain Patient Has Paino No Site Locations Pain Management and Medication Current Pain Management: Electronic Signature(s) Signed: 10/27/2017 4:17:47 PM By: Alric Quan Entered By: Alric Quan on 10/25/2017 10:19:38 Andrew Lyons ([id number]) -------------------------------------------------------------------------------- Patient/Caregiver Education Details Patient Name: Andrew Lyons Date of Service: 10/25/2017 10:30 AM Medical Record Number: [id number] Patient Account Number: [id number] Date of Birth/Gender: 1946/01/21 (72 y.o. Male) Treating RN: [name], [name] Primary Care Physician: [name] Other Clinician: Referring Physician: Referral, Self Treating Physician/Extender: [name] in Treatment: 0 Education Assessment Education Provided To: Patient Education Topics Provided Elevated Blood Sugar/ Impact on Healing: Handouts: Elevated Blood Sugars: How Do They  Affect Wound Healing Methods: Explain/Verbal Responses: State content correctly Offloading: Handouts: What is Offloadingo Methods: Demonstration, Explain/Verbal Responses: State content correctly Welcome To [facility] [place]: Handouts: Welcome To [facility] [place] Methods: Explain/Verbal Responses: State content correctly Wound/Skin Impairment: Handouts: Caring for Your Ulcer, Other: change dressing as ordered Methods: Demonstration, Explain/Verbal Responses: State content correctly Electronic Signature(s) Signed: 10/27/2017 4:17:47 PM By: Alric Quan Entered By: Alric Quan on 10/25/2017 10:58:56 Andrew Lyons ([id number]) -------------------------------------------------------------------------------- Wound Assessment Details Patient Name: Andrew Lyons Date of Service: 10/25/2017 10:30 AM Medical Record Number: [id number] Patient Account Number: [id number] Date of Birth/Sex: 04-22-1946 (72 y.o. Male) Treating RN: [name], [name] Primary Care Lilyonna Steidle: [name] Other Clinician: Referring Annaston Upham: Referral, Self Treating Bryker Fletchall/Extender: [name] in Treatment: 0 Wound Status Wound Number: 2 Primary Pressure Ulcer Etiology: Wound Location: Right Amputation Site - Below Knee Wound Open Wounding Event: Gradually Appeared Status: Date Acquired: 09/24/2017 Comorbid Cataracts, Chronic Obstructive Pulmonary Weeks Of Treatment: 0 History: Disease (COPD), Sleep Apnea, Hypotension, Clustered Wound: No Myocardial Infarction, Type II Diabetes, Osteoarthritis, Neuropathy Wound Measurements Length: (cm) 0.1 Width: (cm) 0.1 Depth: (cm) 0.1 Area: (cm) 0.008 Volume: (cm) 0.001 % Reduction in Area: 0% % Reduction in Volume: 0% Epithelialization: None Tunneling: No Undermining: No Wound Description Classification: Category/Stage III Wound Margin: Distinct, outline attached Exudate Amount: Large Exudate Type:  Serosanguineous Exudate Color: red, brown Foul Odor After Cleansing: No Slough/Fibrino No Wound Bed Granulation Amount: None Present (0%) Necrotic Amount: Large (67-100%) Necrotic Quality: Eschar Periwound Skin Texture Texture Color No Abnormalities Noted: No No Abnormalities Noted: No Callus: Yes Temperature / Pain Moisture Temperature: No Abnormality No Abnormalities Noted: No Tenderness on Palpation: Yes Wound Preparation Ulcer Cleansing: Rinsed/Irrigated with Saline Topical Anesthetic Applied: None, Other: lidocaine 4%, Treatment Notes Wound #2 (Right Amputation Site - Below Knee) 1. Cleansed with: Clean wound with Normal Saline 2. Anesthetic Andrew Lyons, Andrew Lyons ([id number]) Topical Lidocaine 4% cream to wound bed prior to debridement 3. Peri-wound Care: Skin Prep 4. Dressing Applied: Other dressing (specify in notes) 5. Secondary Dressing Applied Bordered Foam Dressing Notes silvercel rope Electronic Signature(s) Signed: 10/27/2017 4:17:47 PM By: Alric Quan Entered By: Alric Quan  on 10/25/2017 10:43:27 Andrew Lyons, Andrew Lyons ([id number]) -------------------------------------------------------------------------------- Vitals Details Patient Name: Andrew Lyons Date of Service: 10/25/2017 10:30 AM Medical Record Number: [id number] Patient Account Number: [id number] Date of Birth/Sex: 27-Aug-1946 (72 y.o. Male) Treating RN: [name], [name] Primary Care Jamese Trauger: [name] Other Clinician: Referring Lilou Kneip: Referral, Self Treating Vicy Medico/Extender: [name] in Treatment: 0 Vital Signs Time Taken: 10:19 Reference Range: 80 - 120 mg / dl Height (in): 71 Source: Stated Weight (lbs): 275 Source: Measured Body Mass Index (BMI): 38.4 Electronic Signature(s) Signed: 10/27/2017 4:17:47 PM By: Alric Quan Entered By: Alric Quan on 10/25/2017 10:20:03

## 2017-11-01 ENCOUNTER — Encounter: Payer: PPO | Attending: Internal Medicine | Admitting: Internal Medicine

## 2017-11-01 DIAGNOSIS — M7989 Other specified soft tissue disorders: Secondary | ICD-10-CM | POA: Diagnosis not present

## 2017-11-01 DIAGNOSIS — L97313 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 of muscle: Secondary | ICD-10-CM | POA: Insufficient documentation

## 2017-11-01 DIAGNOSIS — E1151 Type 2 diabetes mellitus with diabetic peripheral angiopathy without gangrene: Secondary | ICD-10-CM | POA: Insufficient documentation

## 2017-11-01 DIAGNOSIS — G4730 Sleep apnea, unspecified: Secondary | ICD-10-CM | POA: Insufficient documentation

## 2017-11-01 DIAGNOSIS — Z89511 Acquired absence of right leg below knee: Secondary | ICD-10-CM | POA: Insufficient documentation

## 2017-11-01 DIAGNOSIS — J449 Chronic obstructive pulmonary disease, unspecified: Secondary | ICD-10-CM | POA: Insufficient documentation

## 2017-11-01 DIAGNOSIS — E11622 Type 2 diabetes mellitus with other skin ulcer: Secondary | ICD-10-CM | POA: Diagnosis not present

## 2017-11-01 DIAGNOSIS — I252 Old myocardial infarction: Secondary | ICD-10-CM | POA: Diagnosis not present

## 2017-11-01 DIAGNOSIS — B9689 Other specified bacterial agents as the cause of diseases classified elsewhere: Secondary | ICD-10-CM | POA: Diagnosis not present

## 2017-11-01 DIAGNOSIS — L89893 Pressure ulcer of other site, stage 3: Secondary | ICD-10-CM | POA: Diagnosis not present

## 2017-11-02 ENCOUNTER — Other Ambulatory Visit: Payer: Self-pay | Admitting: Family Medicine

## 2017-11-02 DIAGNOSIS — E118 Type 2 diabetes mellitus with unspecified complications: Secondary | ICD-10-CM

## 2017-11-02 NOTE — Progress Notes (Signed)
Andrew Lyons, Andrew Lyons ([id number]) Visit Report for 11/01/2017 Debridement Details Patient Name: Andrew Lyons, Andrew Lyons Date of Service: 11/01/2017 8:00 AM Medical Record Number: [id number] Patient Account Number: [id number] Date of Birth/Sex: [birth year] (72 y.o. Male) Treating RN: [name] Primary Care Provider: [name], Delfino Lovett Other Clinician: Referring Provider: [name], [name] Treating Provider/Extender: [name] in Treatment: 1 Debridement Performed for Wound #2 Right Amputation Site - Below Knee Assessment: Performed By: Physician [name], MD Debridement: Open Wound/Selective Debridement Description: Selective Pre-procedure Verification/Time Yes - 08:15 Out Taken: Start Time: 08:15 Pain Control: Lidocaine 4% Topical Solution Level: Skin/Dermis Total Area Debrided (L x W): 0.2 (cm) x 0.5 (cm) = 0.1 (cm) Tissue and other material Non-Viable, Callus debrided: Instrument: Curette Bleeding: None End Time: 08:18 Procedural Pain: 0 Post Procedural Pain: 0 Response to Treatment: Procedure was tolerated well Post Debridement Measurements of Total Wound Length: (cm) 0.3 Stage: Category/Stage III Width: (cm) 0.5 Depth: (cm) 0.2 Volume: (cm) 0.024 Character of Wound/Ulcer Post Improved Debridement: Post Procedure Diagnosis Same as Pre-procedure Electronic Signature(s) Signed: 11/01/2017 4:49:28 PM By: Linton Ham MD Signed: 11/01/2017 4:57:34 PM By: [name] Entered By: Linton Ham on 11/01/2017 08:21:21 Andrew Lyons ([id number]) -------------------------------------------------------------------------------- HPI Details Patient Name: Andrew Lyons Date of Service: 11/01/2017 8:00 AM Medical Record Number: [id number] Patient Account Number: [id number] Date of Birth/Sex: 26-Jun-1946 (72 y.o. Male) Treating RN: [name] Primary Care Provider: [name], Delfino Lovett Other Clinician: Referring Provider: [name],  [name] Treating Provider/Extender: [name] in Treatment: 1 History of Present Illness HPI Description: 01/04/17; this is a 72 year old diabetic man who has a remote history of a traumatic lower extremity damage at age 23 requiring an amputation. He tells me spent 2 years walking on crutches then ultimately has been walking on prosthesis without any trouble since then. Several months ago he had a new prosthesis and developed an open area in the right stump in December. He saw podiatry Dr. Prudence Davidson who noted that this was really out of his practice jurisdiction and referred him here. He has been using mupirocin. He has continued to walk on the prosthesis. The prosthesis itself as been adjusted by the prostatitis. He does not have a known arterial issue. He tells me he probably has diabetic neuropathy. He had a wound that took a long time to heal surrounding the actual amputation itself however is not had more recent wounds. 01/10/17 Culture from last week grew pseudomonas. change AB to cefdinir yesterday from doxy. he is not using his prosthesis which I emphasized although he is having it adjusted next week 01/17/17; he is completing his antibiotics in the next day or 2. We've been using silver alginate. His prosthesis is away being readjusted. He had an x-ray of the underlying bone here apparently at his podiatrist's office although I'll need to have a look at that and we can't find the result then he may need this re-x-rayed. 01/24/17; he has completed his antibiotics. Change to Silver collagen last week. No real change in the wound this week. 01/31/17 repeat culture I did last week showed a few Pseudomonas. This only has intermediate sensitivity to quinolones I therefore put him back on a week of Cefdinir. X-ray of the stump did not show any bone destruction. We are attempting to get vascular studies done through Dr.Arida's office on May 23. 02/07/17; arterial studies are booked for May  23 no major change. He has completed his antibiotics 02/14/17 arterial studies on Thursday 5/23. using  collagen 02/21/17; the patient had his arterial studies. Both his ABIs were good at 1.0 on the right and 1.1 on the left. His abdominal and iliac arteries revealed atherosclerosis without focal stenosis duplex imaging of the right lower leg revealed heterogeneous plaque and biphasic Doppler waveforms from the right CFA to the popliteal artery. The right distal SFA and tibial peroneal trunk velocities were elevated. 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 The proximal right ATA and PTA were patent above the amputation level. We have been using Silver collagen with some improvement in the wound dimensions 02/28/17; we went ahead and did TCOM measurements on this patient. One of the leads was nonfunctional however on the right leg both the proximal and periwound leads registered greater than 70 mmHg oxygen tension. Interestingly the comparison lead on the left leg/relative wound level was in the mid 40s. This allays any fears I had about contributing wound ischemia. In the meantime is measurements today were 0.2 x 0.4 x 0.7 the wound is now a small divot most of it appears to be epithelialized with only a small open area at that depth. There is no evidence of surrounding infection 03/07/17; continued improvement down to 0.3 cm in depth. This is a very tiny circular opening. No drainage no tenderness. Looks like it is been progressing towards closure. He has been using Silver collagen although I think this is too small to use this as a primary dressing. 03/14/17; still 0.3 cm in depth tiny circular wound. No drainage or tenderness. Used Iodosorb ointment starting last week 03/21/17; still the depth and this wound. Using a #3 curet I remove necrotic material from the wound base. There is a divot here. Not making enough improvement on the Iodosorb, I change back to silver collagen this  week. 03/28/17; depth down to 0.2. I cannot actually see a surface of this wound. We've been using silver collagen which I will continue 04/04/17 on evaluation today patient appears to be doing very well. In fact his wound appears to be completely healed though there is a slight eschar over the area I see no fluctuance underneath and I am reluctant to attempt scraping on the area due to the very center superficial nature of the eschar. Patient has no discomfort. 04/11/17; the patient went back to [facility] to his prosthetist however his leg was too swollen on the right deficient into the prosthesis. The wound itself on the tip of his stump on the right is closed and was close last week. I see no reason to be concerned about this. Andrew Lyons, Andrew Lyons ([id number]) READMISSION 10/25/17; this is a patient that we had in clinic from April to July 2018 with a probing tiny open area on his right below sided below-knee amputation site. X-ray of this area did not show osteomyelitis. I had some concern about this being ischemic for a while although we did ABIs and T Commons that did a lot to allay these concerns. The patient is a type II diabetic although I don't have a recent hemoglobin A1c. He tells me that after he left the clinic last time he got a new prosthesis, not the prosthesis that originally caused wounds in this area. He states about a month ago used noted some dark discoloration in his skin or at least his wife did when he was getting out of the shower. He since has noted increasing pain and tenderness when he walks around this area. He arrives in clinic with no open wound  11/01/17; small probing wound on the medial aspect of the stump from a distal amputation site remotely. Surprisingly culture I did last week showed gram-positive cocci but did not culture. X-ray showed edema but no bony abnormality no soft tissue gas etc. we've been using silver alginate. He is offloading this using a  Chief Operating Officer) Signed: 11/01/2017 4:49:28 PM By: Linton Ham MD Entered By: Linton Ham on 11/01/2017 08:23:18 Andrew Lyons ([id number]) -------------------------------------------------------------------------------- Physical Exam Details Patient Name: Andrew Lyons Date of Service: 11/01/2017 8:00 AM Medical Record Number: [id number] Patient Account Number: [id number] Date of Birth/Sex: 03-13-1946 (72 y.o. Male) Treating RN: [name] Primary Care Provider: [name], Delfino Lovett Other Clinician: Referring Provider: [name], [name] Treating Provider/Extender: [name] in Treatment: 1 Constitutional Patient is hypertensive.. Pulse regular and within target range for patient.Marland Kitchen Respirations regular, non-labored and within target range.. Temperature is normal and within the target range for the patient.Marland Kitchen appears in no distress. Notes Wound exam; the patient has a distal right below-knee amputation. On the medial aspect of the stump small probing area that we have identified last week. He states the tenderness is gone after I opened it last week. As noted culture from last week was negative. There is no palpable tenderness or drainage. Using a #3 curet I removed circumferential callus and nonviable skin. He has a small wound with some depth. Base of this appears to be healthy. Electronic Signature(s) Signed: 11/01/2017 4:49:28 PM By: Linton Ham MD Entered By: Linton Ham on 11/01/2017 08:26:02 Andrew Lyons ([id number]) -------------------------------------------------------------------------------- Physician Orders Details Patient Name: Andrew Lyons Date of Service: 11/01/2017 8:00 AM Medical Record Number: [id number] Patient Account Number: [id number] Date of Birth/Sex: [birth year] (72 y.o. Male) Treating RN: [name] Primary Care Provider: [name], Delfino Lovett Other Clinician: Referring Provider: [name],  [name] Treating Provider/Extender: [name] in Treatment: 1 Verbal / Phone Orders: No Diagnosis Coding Wound Cleansing Wound #2 Right Amputation Site - Below Knee o Clean wound with Normal Saline. o Cleanse wound with mild soap and water o May Shower, gently pat wound dry prior to applying new dressing. Anesthetic (add to Medication List) Wound #2 Right Amputation Site - Below Knee o Topical Lidocaine 4% cream applied to wound bed prior to debridement (In Clinic Only). Skin Barriers/Peri-Wound Care Wound #2 Right Amputation Site - Below Knee o Skin Prep Primary Wound Dressing Wound #2 Right Amputation Site - Below Knee o Silvercel Non-Adherent - rope Secondary Dressing Wound #2 Right Amputation Site - Below Knee o Boardered Foam Dressing Dressing Change Frequency Wound #2 Right Amputation Site - Below Knee o Change dressing every other day. Follow-up Appointments Wound #2 Right Amputation Site - Below Knee o Return Appointment in 1 week. Off-Loading Wound #2 Right Amputation Site - Below Knee o Other: - keep pressure off area Additional Orders / Instructions Wound #2 Right Amputation Site - Below Knee o Increase protein intake. Medications-please add to medication list. Wound #2 Right Amputation Site - Below Knee Andrew Lyons, Andrew Lyons ([id number]) o P.O. Antibiotics Patient Medications Allergies: Zyban, Zocor Notifications Medication Indication Start End Augmentin wound infection 11/01/2017 DOSE oral 875 mg-125 mg tablet - 1 tablet oral bid for 7 days Electronic Signature(s) Signed: 11/01/2017 8:28:21 AM By: Linton Ham MD Entered By: Linton Ham on 11/01/2017 40:81:44 Andrew Lyons ([id number]) -------------------------------------------------------------------------------- Problem List Details Patient Name: Andrew Lyons Date of Service: 11/01/2017 8:00 AM Medical Record Number: [id number] Patient Account  Number: [id number] Date of Birth/Sex:  May 16, 1946 (72 y.o. 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 Male) Treating RN: [name] Primary Care Provider: [name], Delfino Lovett Other Clinician: Referring Provider: [name], [name] Treating Provider/Extender: [name] in Treatment: 1 Subjective History of Present Illness (HPI) 01/04/17; this is a 72 year old diabetic man who has a remote history of a traumatic lower extremity damage at age 21 requiring an amputation. He tells me spent 2 years walking on crutches then ultimately has been walking on prosthesis without any trouble since then. Several months ago he had a new prosthesis and developed an open area in the right stump in December. He saw podiatry Dr. Prudence Davidson who noted that this was really out of his practice jurisdiction and referred him here. He has been using mupirocin. He has continued to walk on the prosthesis. The prosthesis itself as  been adjusted by the prostatitis. He does not have a known arterial issue. He tells me he probably has diabetic neuropathy. He had a wound that took a long time to heal surrounding the actual amputation itself however is not had more recent wounds. 01/10/17 Culture from last week grew pseudomonas. change AB to cefdinir yesterday from doxy. he is not using his prosthesis which I emphasized although he is having it adjusted next week 01/17/17; he is completing his antibiotics in the next day or 2. We've been using silver alginate. His prosthesis is away being readjusted. He had an x-ray of the underlying bone here apparently at his podiatrist's office although I'll need to have a look at that and we can't find the result then he may need this re-x-rayed. 01/24/17; he has completed his antibiotics. Change to Silver collagen last week. No real change in the wound this week. 01/31/17 repeat culture I did last week showed a few Pseudomonas. This only has intermediate sensitivity to quinolones I therefore put him back on a week of Cefdinir. X-ray of the stump did not show any bone destruction. We are attempting to get vascular studies done through Dr.Arida's office on May 23. 02/07/17; arterial studies are booked for May 23 no major change. He has completed his antibiotics 02/14/17 arterial studies on Thursday 5/23. using collagen 02/21/17; the patient had his arterial studies. Both his ABIs were good at 1.0 on the right and 1.1 on the left. His abdominal and iliac arteries revealed atherosclerosis without focal stenosis duplex imaging of the right lower leg revealed heterogeneous plaque and biphasic Doppler waveforms from the right CFA to the popliteal artery. The right distal SFA and tibial peroneal trunk velocities were elevated. 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 The proximal right ATA and PTA were patent above the amputation level. We have been using Silver collagen with some  improvement in the wound dimensions 02/28/17; we  went ahead and did TCOM measurements on this patient. One of the leads was nonfunctional however on the right leg both the proximal and periwound leads registered greater than 70 mmHg oxygen tension. Interestingly the comparison lead on the left leg/relative wound level was in the mid 40s. This allays any fears I had about contributing wound ischemia. In the meantime is measurements today were 0.2 x 0.4 x 0.7 the wound is now a small divot most of it appears to be epithelialized with only a small open area at that depth. There is no evidence of surrounding infection 03/07/17; continued improvement down to 0.3 cm in depth. This is a very tiny circular opening. No drainage no tenderness. Looks like it is been progressing towards closure. He has been using Silver collagen although I think this is too small to use this as a primary dressing. 03/14/17; still 0.3 cm in depth tiny circular wound. No drainage or tenderness. Used Iodosorb ointment starting last week 03/21/17; still the depth and this wound. Using a #3 curet I remove necrotic material from the wound base. There is a divot here. Not making enough improvement on the Iodosorb, I change back to silver collagen this week. 03/28/17; depth down to 0.2. I cannot actually see a surface of this wound. We've been using silver collagen which I will continue 04/04/17 on evaluation today patient appears to be doing very well. In fact his wound appears to be completely healed though there is a slight eschar over the area I see no fluctuance underneath and I am reluctant to attempt scraping on the area due to the very center superficial nature of the eschar. Patient has no discomfort. 04/11/17; the patient went back to [facility] to his prosthetist however his leg was too swollen on the right deficient into the prosthesis. The wound itself on the tip of his stump on the right is closed and was close last week. I  see no reason to be Andrew Lyons, Andrew T. ([id number]) concerned about this. READMISSION 10/25/17; this is a patient that we had in clinic from April to July 2018 with a probing tiny open area on his right below sided below-knee amputation site. X-ray of this area did not show osteomyelitis. I had some concern about this being ischemic for a while although we did ABIs and T Commons that did a lot to allay these concerns. The patient is a type II diabetic although I don't have a recent hemoglobin A1c. He tells me that after he left the clinic last time he got a new prosthesis, not the prosthesis that originally caused wounds in this area. He states about a month ago used noted some dark discoloration in his skin or at least his wife did when he was getting out of the shower. He since has noted increasing pain and tenderness when he walks around this area. He arrives in clinic with no open wound 11/01/17; small probing wound on the medial aspect of the stump from a distal amputation site remotely. Surprisingly culture I did last week showed gram-positive cocci but did not culture. X-ray showed edema but no bony abnormality no soft tissue gas etc. we've been using silver alginate. He is offloading this using a scooter Objective Constitutional Patient is hypertensive.. Pulse regular and within target range for patient.Marland Kitchen Respirations regular, non-labored and within target range.. Temperature is normal and within the target range for the patient.Marland Kitchen appears in no distress. Vitals Time Taken: 8:07 AM, Height: 71 in, Source: Measured, Weight: 275 lbs,  Source: Measured, BMI: 38.4, Temperature: 98.0 F, Pulse: 62 bpm, Respiratory Rate: 18 breaths/min, Blood Pressure: 148/56 mmHg. General Notes: Wound exam; the patient has a distal right below-knee amputation. On the medial aspect of the stump small probing area that we have identified last week. He states the tenderness is gone after I opened it last week. As  noted culture from last week was negative. There is no palpable tenderness or drainage. Using a #3 curet I removed circumferential callus and nonviable skin. He has a small wound with some depth. Base of this appears to be healthy. Integumentary (Hair, Skin) Wound #2 status is Open. Original cause of wound was Gradually Appeared. 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 The wound measures 0.2cm length x 0.5cm width x 0.2cm depth; 0.079cm^2 area and 0.016cm^3 volume. There is no tunneling or undermining noted. There is a large amount of serosanguineous drainage noted. The wound margin is distinct with the outline attached to the wound base. There is large (67-100%) pink granulation within the wound bed. There is no necrotic tissue within the wound bed. The periwound skin appearance exhibited: Callus. Periwound temperature was noted as No Abnormality. The periwound has tenderness on palpation. 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 There was a Skin/Dermis Open Wound/Selective [phone]) debridement with total area of 0.1 sq cm performed by [name], MD. with the following instrument(s): Curette to remove Non-Viable tissue/material including Callus after achieving pain control using Lidocaine 4% Topical Solution. A time out was conducted at 08:15, prior to the start of the procedure. There was no bleeding. The procedure was tolerated well with a pain level of 0 throughout and a pain level of 0 following the procedure. Post Debridement Measurements: 0.3cm length x  0.5cm width x 0.2cm depth; 0.024cm^3 volume. Post debridement Stage noted as Category/Stage III. Character of Wound/Ulcer Post Debridement is improved. Post procedure Diagnosis Wound #2: Same as Pre-Procedure Plan Wound Cleansing: Wound #2 Right Amputation Site - Below Knee: Clean wound with Normal Saline. Cleanse wound with mild soap and water May Shower, gently pat wound dry prior to applying new dressing. 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 Off-Loading: Wound #2 Right Amputation Site - Below Knee: Other: - keep pressure off area Additional Orders / Instructions: Wound #2 Right Amputation Site - Below Knee: Increase protein intake. Medications-please add to medication list.: Wound #2 Right Amputation Site - Below Knee: P.O. Antibiotics Andrew Lyons, Andrew Lyons ([id number]) The following medication(s) was prescribed: Augmentin oral 875 mg-125 mg tablet 1 tablet oral bid for 7 days for wound infection starting 11/01/2017 #1  in spite of the negative culture the Gram stain showed abundant gram-positive cocci. I wonder whether this could be an anaerobic infection. I have empirically given him 7 days of Augmentin. Noteworthy that his x-ray showed soft tissue swelling which also landed credence to the possibility of ongoing infection. Last week he complained of pain after I opened this  the pain resolved. #2 Augmentin 875 one by mouth twice a day for 7 days #3 I have asked him to explore the issue of callus that he needs to file with his prosthesis prescriber. I was not aware that he was filing this area #4 offload completely for now #5 if this wound stalls or gets larger or develops more purulence I think he will need an MRI to look at the underlying bone #6 although the stump is reasonably cold and I've difficulty with his popliteal pulse and extensive arterial review I did last time did not suggest an issue Electronic Signature(s) Signed: 11/01/2017 4:49:28 PM By: Linton Ham MD Entered By: Linton Ham on 11/01/2017 08:31:01 Andrew Lyons ([id number]) -------------------------------------------------------------------------------- SuperBill Details Patient Name: Andrew Lyons Date of Service: 11/01/2017 Medical Record Number: [id number] Patient Account Number: [id number] Date of Birth/Sex: [birth year] (72 y.o. 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, right lower leg, subsequent encounter Facility Procedures CPT4 Code: [id number] Description: 61443 - DEBRIDE WOUND 1ST 20 SQ CM OR < ICD-10 Diagnosis Description L97.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

## 2017-11-02 NOTE — Progress Notes (Signed)
BERTIL, BRICKEY ([id number]) Visit Report for 11/01/2017 Arrival Information Details Patient Name: Andrew Lyons, Andrew Lyons Date of Service: 11/01/2017 8:00 AM Medical Record Number: [id number] Patient Account Number: [id number] Date of Birth/Sex: Oct 19, 1945 (72 y.o. Male) Treating RN: [name] Primary Care Tajha Sammarco: [name], Delfino Lovett Other Clinician: Referring Jhanvi Drakeford: [name], [name] Treating Camika Marsico/Extender: [name] in Treatment: 1 Visit Information History Since Last Visit Added or deleted any medications: No Patient Arrived: Other Any new allergies or adverse reactions: No Arrival Time: 08:04 Had a fall or experienced change in No Accompanied By: self activities of daily living that may affect Transfer Assistance: None risk of falls: Patient Identification Verified: Yes Signs or symptoms of abuse/neglect since last visito No Secondary Verification Process Completed: Yes Hospitalized since last visit: No Patient Requires Transmission-Based Precautions: No Has Dressing in Place as Prescribed: Yes Patient Has Alerts: Yes Pain Present Now: No Patient Alerts: DM II Electronic Signature(s) Signed: 11/01/2017 4:57:34 PM By: [name] Entered By: [name] on 11/01/2017 08:06:16 Andrew Lyons ([id number]) -------------------------------------------------------------------------------- Encounter Discharge Information Details Patient Name: Andrew Lyons Date of Service: 11/01/2017 8:00 AM Medical Record Number: [id number] Patient Account Number: [id number] Date of Birth/Sex: [birth year] (72 y.o. Male) Treating RN: [name] Primary Care Kimmberly Wisser: [name], Delfino Lovett Other Clinician: Referring Kattleya Kuhnert: [name], [name] Treating Sair Faulcon/Extender: [name] in Treatment: 1 Encounter Discharge Information Items Discharge Pain Level: 0 Discharge Condition: Stable Ambulatory Status: Walker Discharge Destination:  Home Transportation: Private Auto Accompanied By: self Schedule Follow-up Appointment: Yes Medication Reconciliation completed and No provided to Patient/Care Julius Matus: Provided on Clinical Summary of Care: 11/01/2017 Form Type Recipient Paper Patient KA Electronic Signature(s) Signed: 11/01/2017 8:32:29 AM By: [name] Entered By: [name] on 11/01/2017 08:32:29 Andrew Lyons ([id number]) -------------------------------------------------------------------------------- Lower Extremity Assessment Details Patient Name: Andrew Lyons Date of Service: 11/01/2017 8:00 AM Medical Record Number: [id number] Patient Account Number: [id number] Date of Birth/Sex: October 05, 1945 (72 y.o. Male) Treating RN: [name] Primary Care Burnard Enis: [name], Delfino Lovett Other Clinician: Referring Crescent Gotham: [name], [name] Treating Kassidie Hendriks/Extender: [name] in Treatment: 1 Vascular Assessment Pulses: Posterior Tibial Extremity colors, hair growth, and conditions: Extremity Color: [Right:Normal] Hair Growth on Extremity: [Right:No] Temperature of Extremity: [Right:Warm] Capillary Refill: [Right:< 3 seconds] Electronic Signature(s) Signed: 11/01/2017 4:57:34 PM By: [name] Entered By: [name] on 11/01/2017 08:15:37 Andrew Lyons ([id number]) -------------------------------------------------------------------------------- Multi Wound Chart Details Patient Name: Andrew Lyons Date of Service: 11/01/2017 8:00 AM Medical Record Number: [id number] Patient Account Number: [id number] Date of Birth/Sex: [birth year] (72 y.o. Male) Treating RN: [name] Primary Care Louine Tenpenny: [name], Delfino Lovett Other Clinician: Referring Japheth Diekman: [name], [name] Treating Aletta Edmunds/Extender: [name] in Treatment: 1 Vital Signs Height(in): 71 Pulse(bpm): 66 Weight(lbs): 275 Blood Pressure(mmHg): 148/56 Body Mass Index(BMI):  38 Temperature(F): 98.0 Respiratory Rate 18 (breaths/min): Photos: [2:No Photos] [N/A:N/A] Wound Location: [2:Right Amputation Site - Below N/A Knee] Wounding Event: [2:Gradually Appeared] [N/A:N/A] Primary Etiology: [2:Pressure Ulcer] [N/A:N/A] Comorbid History: [2:Cataracts, Chronic Obstructive N/A Pulmonary Disease (COPD), Sleep Apnea, Hypotension, Myocardial Infarction, Type II Diabetes, Osteoarthritis, Neuropathy] Date Acquired: [2:09/24/2017] [N/A:N/A] Weeks of Treatment: [2:1] [N/A:N/A] Wound Status: [2:Open] [N/A:N/A] Measurements L x W x D [2:0.2x0.5x0.2] [N/A:N/A] (cm) Area (cm) : [2:0.079] [N/A:N/A] Volume (cm) : [2:0.016] [N/A:N/A] % Reduction in Area: [2:-887.50%] [N/A:N/A] % Reduction in Volume: [2:-1500.00%] [N/A:N/A] Classification: [2:Category/Stage III] [N/A:N/A] Exudate Amount: [2:Large] [N/A:N/A] Exudate Type: [2:Serosanguineous] [N/A:N/A] Exudate Color: [2:red, brown] [N/A:N/A] Wound Margin: [2:Distinct, outline attached] [N/A:N/A] Granulation  Amount: [2:Large (67-100%)] [N/A:N/A] Granulation Quality: [2:Pink] [N/A:N/A] Necrotic Amount: [2:None Present (0%)] [N/A:N/A] Epithelialization: [2:None] [N/A:N/A] Debridement: [2:Open Wound/Selective ([id number]) - Selective] [N/A:N/A] Pre-procedure [2:08:15] [N/A:N/A] Verification/Time Out Taken: Pain Control: [2:Lidocaine 4% Topical Solution] [N/A:N/A] Tissue Debrided: [2:Callus] [N/A:N/A] Level: [2:Skin/Dermis] [N/A:N/A] Debridement Area (sq cm): [2:0.1] [N/A:N/A] Instrument: Curette N/A N/A Bleeding: None N/A N/A Procedural Pain: 0 N/A N/A Post Procedural Pain: 0 N/A N/A Debridement Treatment Procedure was tolerated well N/A N/A Response: Post Debridement 0.3x0.5x0.2 N/A N/A Measurements L x W x D (cm) Post Debridement Volume: 0.024 N/A N/A (cm) Post Debridement Stage: Category/Stage III N/A N/A Periwound Skin Texture: Callus: Yes N/A N/A Periwound Skin Moisture: No Abnormalities Noted N/A  N/A Periwound Skin Color: No Abnormalities Noted N/A N/A Temperature: No Abnormality N/A N/A Tenderness on Palpation: Yes N/A N/A Wound Preparation: Ulcer Cleansing: N/A N/A Rinsed/Irrigated with Saline Topical Anesthetic Applied: None, Other: lidocaine 4% Procedures Performed: Debridement N/A N/A Treatment Notes Electronic Signature(s) Signed: 11/01/2017 4:49:28 PM By: Linton Ham MD Entered By: Linton Ham on 11/01/2017 08:21:07 Andrew Lyons ([id number]) -------------------------------------------------------------------------------- [place] Details Patient Name: Andrew Lyons Date of Service: 11/01/2017 8:00 AM Medical Record Number: [id number] Patient Account Number: [id number] Date of Birth/Sex: 14-Oct-1945 (72 y.o. Male) Treating RN: [name] Primary Care Naje Rice: [name], Delfino Lovett Other Clinician: Referring Latonya Knight: [name], [name] Treating Doree Kuehne/Extender: [name] in Treatment: 1 Active Inactive ` Nutrition Nursing Diagnoses: Imbalanced nutrition Impaired glucose control: actual or potential Goals: Patient/caregiver will maintain therapeutic glucose control Date Initiated: 10/25/2017 Target Resolution Date: 12/30/2017 Goal Status: Active Interventions: Assess patient nutrition upon admission and as needed per policy Provide education on elevated blood sugars and impact on wound healing Notes: ` Orientation to the Wound Care Program Nursing Diagnoses: Knowledge deficit related to the wound healing center program Goals: Patient/caregiver will verbalize understanding of the [place] Date Initiated: 10/25/2017 Target Resolution Date: 11/04/2017 Goal Status: Active Interventions: Provide education on orientation to the wound center Notes: ` Pressure Nursing Diagnoses: Knowledge deficit related to causes and risk factors for pressure ulcer development Knowledge deficit related  to management of pressures ulcers Potential for impaired tissue integrity related to pressure, friction, moisture, and shear Goals: Patient will remain free from development of additional pressure ulcers CIRE, CLUTE ([id number]) Date Initiated: 10/25/2017 Target Resolution Date: 02/03/2018 Goal Status: Active Interventions: Assess: immobility, friction, shearing, incontinence upon admission and as needed Assess offloading mechanisms upon admission and as needed Assess potential for pressure ulcer upon admission and as needed Notes: ` Wound/Skin Impairment Nursing Diagnoses: Impaired tissue integrity Knowledge deficit related to ulceration/compromised skin integrity Goals: Ulcer/skin breakdown will have a volume reduction of 80% by week 12 Date Initiated: 10/25/2017 Target Resolution Date: 01/27/2018 Goal Status: Active Interventions: Assess patient/caregiver ability to perform ulcer/skin care regimen upon admission and as needed Assess ulceration(s) every visit Notes: Electronic Signature(s) Signed: 11/01/2017 4:57:34 PM By: [name] Entered By: [name] on 11/01/2017 08:15:41 Andrew Lyons ([id number]) -------------------------------------------------------------------------------- Pain Assessment Details Patient Name: Andrew Lyons Date of Service: 11/01/2017 8:00 AM Medical Record Number: [id number] Patient Account Number: [id number] Date of Birth/Sex: 1946/01/24 (72 y.o. Male) Treating RN: [name] Primary Care Romonia Yanik: [name], Delfino Lovett Other Clinician: Referring Maritssa Haughton: [name], [name] Treating Aniella Wandrey/Extender: [name] in Treatment: 1 Active Problems Location of Pain Severity and Description of Pain Patient Has Paino No Site Locations Pain Management and Medication Current Pain Management: Notes Topical or injectable lidocaine is offered to patient  for acute pain when surgical debridement is performed. If  needed, Patient is instructed to use over the counter pain medication for the following 24-48 hours after debridement. Wound care MDs do not prescribed pain medications. Patient has chronic pain or uncontrolled pain. Patient has been instructed to make an appointment with their Primary Care Physician for pain management. Electronic Signature(s) Signed: 11/01/2017 4:57:34 PM By: [name] Entered By: [name] on 11/01/2017 08:07:56 Andrew Lyons ([id number]) -------------------------------------------------------------------------------- Patient/Caregiver Education Details Patient Name: Andrew Lyons Date of Service: 11/01/2017 8:00 AM Medical Record Number: [id number] Patient Account Number: [id number] Date of Birth/Gender: 10/12/1945 (72 y.o. Male) Treating RN: [name] Primary Care Physician: [name], Delfino Lovett Other Clinician: Referring Physician: [name] Treating Physician/Extender: [name] in Treatment: 1 Education Assessment Education Provided To: Patient Education Topics Provided Wound/Skin Impairment: Handouts: Other: wound care as ordered Methods: Demonstration, Explain/Verbal Responses: State content correctly Electronic Signature(s) Signed: 11/01/2017 4:57:34 PM By: [name] Entered By: [name] on 11/01/2017 08:32:52 Andrew Lyons ([id number]) -------------------------------------------------------------------------------- Wound Assessment Details Patient Name: Andrew Lyons Date of Service: 11/01/2017 8:00 AM Medical Record Number: [id number] Patient Account Number: [id number] Date of Birth/Sex: [birth year] (72 y.o. Male) Treating RN: [name] Primary Care Mahlani Berninger: [name], Delfino Lovett Other Clinician: Referring Mitsuru Dault: [name], [name] Treating Avira Tillison/Extender: [name] in Treatment: 1 Wound Status Wound Number: 2 Primary Pressure Ulcer Etiology: Wound  Location: Right Amputation Site - Below Knee Wound Open Wounding Event: Gradually Appeared Status: Date Acquired: 09/24/2017 Comorbid Cataracts, Chronic Obstructive Pulmonary Weeks Of Treatment: 1 History: Disease (COPD), Sleep Apnea, Hypotension, Clustered Wound: No Myocardial Infarction, Type II Diabetes, Osteoarthritis, Neuropathy Photos Photo Uploaded By: [name] on 11/01/2017 08:51:38 Wound Measurements Length: (cm) 0.2 Width: (cm) 0.5 Depth: (cm) 0.2 Area: (cm) 0.079 Volume: (cm) 0.016 % Reduction in Area: -887.5% % Reduction in Volume: -1500% Epithelialization: None Tunneling: No Undermining: No Wound Description Classification: Category/Stage III Wound Margin: Distinct, outline attached Exudate Amount: Large Exudate Type: Serosanguineous Exudate Color: red, brown Foul Odor After Cleansing: No Slough/Fibrino No Wound Bed Granulation Amount: Large (67-100%) Granulation Quality: Pink Necrotic Amount: None Present (0%) Periwound Skin Texture Texture Color No Abnormalities Noted: No No Abnormalities Noted: No Callus: Yes Temperature / Pain Nigh, Selma T. ([id number]) Moisture Temperature: No Abnormality No Abnormalities Noted: No Tenderness on Palpation: Yes Wound Preparation Ulcer Cleansing: Rinsed/Irrigated with Saline Topical Anesthetic Applied: None, Other: lidocaine 4%, Treatment Notes Wound #2 (Right Amputation Site - Below Knee) 1. Cleansed with: Clean wound with Normal Saline 2. Anesthetic Topical Lidocaine 4% cream to wound bed prior to debridement 3. Peri-wound Care: Skin Prep 4. Dressing Applied: Other dressing (specify in notes) 5. Secondary Dressing Applied Bordered Foam Dressing Notes silvercel Electronic Signature(s) Signed: 11/01/2017 4:57:34 PM By: [name] Entered By: [name] on 11/01/2017 08:11:26 Andrew Lyons  ([id number]) -------------------------------------------------------------------------------- Welcome Details Patient Name: Andrew Lyons Date of Service: 11/01/2017 8:00 AM Medical Record Number: [id number] Patient Account Number: [id number] Date of Birth/Sex: 12/27/1945 (72 y.o. Male) Treating RN: [name] Primary Care Sindia Kowalczyk: [name], Delfino Lovett Other Clinician: Referring Finch Costanzo: [name], [name] Treating Jalyne Brodzinski/Extender: [name] in Treatment: 1 Vital Signs Time Taken: 08:07 Temperature (F): 98.0 Height (in): 71 Pulse (bpm): 62 Source: Measured Respiratory Rate (breaths/min): 18 Weight (lbs): 275 Blood Pressure (mmHg): 148/56 Source: Measured Reference Range: 80 - 120 mg / dl Body Mass Index (BMI): 38.4 Electronic Signature(s) Signed: 11/01/2017 4:57:34 PM By: [name] Entered By:  [name] on 11/01/2017 08:08:33

## 2017-11-08 ENCOUNTER — Encounter: Payer: PPO | Admitting: Internal Medicine

## 2017-11-08 DIAGNOSIS — E11622 Type 2 diabetes mellitus with other skin ulcer: Secondary | ICD-10-CM | POA: Diagnosis not present

## 2017-11-08 DIAGNOSIS — T8189XA Other complications of procedures, not elsewhere classified, initial encounter: Secondary | ICD-10-CM | POA: Diagnosis not present

## 2017-11-09 NOTE — Progress Notes (Signed)
Andrew Lyons, Andrew Lyons ([id number]) Visit Report for 11/08/2017 Arrival Information Details Patient Name: Andrew Lyons, Andrew Lyons Date of Service: 11/08/2017 8:00 AM Medical Record Number: [id number] Patient Account Number: [id number] Date of Birth/Sex: [birth year] (72 y.o. Male) Treating RN: [name] Primary Care Levester Waldridge: [name], Delfino Lovett Other Clinician: Referring Idelia Caudell: [name], [name] Treating Declynn Lopresti/Extender: [name] in Treatment: 2 Visit Information History Since Last Visit Added or deleted any medications: No Patient Arrived: Other Any new allergies or adverse reactions: No Arrival Time: 08:05 Had a fall or experienced change in No Accompanied By: self activities of daily living that may affect Transfer Assistance: None risk of falls: Patient Identification Verified: Yes Signs or symptoms of abuse/neglect since last visito No Secondary Verification Process Completed: Yes Hospitalized since last visit: No Patient Requires Transmission-Based Precautions: No Has Dressing in Place as Prescribed: Yes Patient Has Alerts: Yes Pain Present Now: No Patient Alerts: DM II Electronic Signature(s) Signed: 11/08/2017 4:09:49 PM By: [name] Entered By: [name] on 11/08/2017 08:08:16 Andrew Lyons ([id number]) -------------------------------------------------------------------------------- Clinic Level of Care Assessment Details Patient Name: Andrew Lyons Date of Service: 11/08/2017 8:00 AM Medical Record Number: [id number] Patient Account Number: [id number] Date of Birth/Sex: 26-Apr-1946 (72 y.o. Male) Treating RN: [name] Primary Care Shaquila Sigman: [name], Delfino Lovett Other Clinician: Referring Robet Crutchfield: [name], [name] Treating Kapena Hamme/Extender: [name] in Treatment: 2 Clinic Level of Care Assessment Items TOOL 4 Quantity Score []  - Use when only an EandM is performed on FOLLOW-UP visit 0 ASSESSMENTS -  Nursing Assessment / Reassessment X - Reassessment of Co-morbidities (includes updates in patient status) 1 10 X- 1 5 Reassessment of Adherence to Treatment Plan ASSESSMENTS - Wound and Skin Assessment / Reassessment X - Simple Wound Assessment / Reassessment - one wound 1 5 []  - 0 Complex Wound Assessment / Reassessment - multiple wounds []  - 0 Dermatologic / Skin Assessment (not related to wound area) ASSESSMENTS - Focused Assessment []  - Circumferential Edema Measurements - multi extremities 0 []  - 0 Nutritional Assessment / Counseling / Intervention X- 1 5 Lower Extremity Assessment (monofilament, tuning fork, pulses) []  - 0 Peripheral Arterial Disease Assessment (using hand held doppler) ASSESSMENTS - Ostomy and/or Continence Assessment and Care []  - Incontinence Assessment and Management 0 []  - 0 Ostomy Care Assessment and Management (repouching, etc.) PROCESS - Coordination of Care X - Simple Patient / Family Education for ongoing care 1 15 []  - 0 Complex (extensive) Patient / Family Education for ongoing care []  - 0 Staff obtains Programmer, systems, Records, Test Results / Process Orders []  - 0 Staff telephones HHA, Nursing Homes / Clarify orders / etc []  - 0 Routine Transfer to another Facility (non-emergent condition) []  - 0 Routine Hospital Admission (non-emergent condition) []  - 0 New Admissions / Biomedical engineer / Ordering NPWT, Apligraf, etc. []  - 0 Emergency Hospital Admission (emergent condition) X- 1 10 Simple Discharge Coordination Andrew Lyons, Andrew Lyons ([id number]) []  - 0 Complex (extensive) Discharge Coordination PROCESS - Special Needs []  - Pediatric / Minor Patient Management 0 []  - 0 Isolation Patient Management []  - 0 Hearing / Language / Visual special needs []  - 0 Assessment of Community assistance (transportation, D/C planning, etc.) []  - 0 Additional assistance / Altered mentation []  - 0 Support Surface(s) Assessment (bed, cushion, seat,  etc.) INTERVENTIONS - Wound Cleansing / Measurement X - Simple Wound Cleansing - one wound 1 5 []  - 0 Complex Wound Cleansing - multiple wounds X- 1 5 Wound Imaging (photographs -  any number of wounds) []  - 0 Wound Tracing (instead of photographs) X- 1 5 Simple Wound Measurement - one wound []  - 0 Complex Wound Measurement - multiple wounds INTERVENTIONS - Wound Dressings []  - Small Wound Dressing one or multiple wounds 0 []  - 0 Medium Wound Dressing one or multiple wounds []  - 0 Large Wound Dressing one or multiple wounds []  - 0 Application of Medications - topical []  - 0 Application of Medications - injection INTERVENTIONS - Miscellaneous []  - External ear exam 0 []  - 0 Specimen Collection (cultures, biopsies, blood, body fluids, etc.) []  - 0 Specimen(s) / Culture(s) sent or taken to Lab for analysis []  - 0 Patient Transfer (multiple staff / Civil Service fast streamer / Similar devices) []  - 0 Simple Staple / Suture removal (25 or less) []  - 0 Complex Staple / Suture removal (26 or more) []  - 0 Hypo / Hyperglycemic Management (close monitor of Blood Glucose) []  - 0 Ankle / Brachial Index (ABI) - do not check if billed separately X- 1 5 Vital Signs Andrew Lyons, Andrew Lyons ([id number]) Has the patient been seen at the hospital within the last three years: Yes Total Score: 70 Level Of Care: New/Established - Level 2 Electronic Signature(s) Signed: 11/08/2017 4:09:49 PM By: [name] Entered By: [name] on 11/08/2017 09:45:02 Andrew Lyons ([id number]) -------------------------------------------------------------------------------- Encounter Discharge Information Details Patient Name: Andrew Lyons Date of Service: 11/08/2017 8:00 AM Medical Record Number: [id number] Patient Account Number: [id number] Date of Birth/Sex: [birth year] (72 y.o. Male) Treating RN: [name] Primary Care Yves Fodor: [name], Delfino Lovett Other Clinician: Referring Lavenia Stumpo: [name], [name] Treating Yonatan Guitron/Extender: [name] in Treatment: 2 Encounter Discharge Information Items Discharge Pain Level: 0 Discharge Condition: Stable Ambulatory Status: Walker Discharge Destination: Home Transportation: Private Auto Accompanied By: self Schedule Follow-up Appointment: No Medication Reconciliation completed and No provided to Patient/Care Breya Cass: Provided on Clinical Summary of Care: 11/08/2017 Form Type Recipient Paper Patient KA Electronic Signature(s) Signed: 11/08/2017 9:45:30 AM By: [name] Entered By: [name] on 11/08/2017 09:45:30 Andrew Lyons ([id number]) -------------------------------------------------------------------------------- Lower Extremity Assessment Details Patient Name: Andrew Lyons Date of Service: 11/08/2017 8:00 AM Medical Record Number: [id number] Patient Account Number: [id number] Date of Birth/Sex: 1946/07/03 (72 y.o. Male) Treating RN: [name] Primary Care Alp Goldwater: [name], Delfino Lovett Other Clinician: Referring Kodie Kishi: [name], [name] Treating Ibrahim Mcpheeters/Extender: [name] in Treatment: 2 Edema Assessment Assessed: [Left: No] [Right: No] Edema: [Left: N] [Right: o] Vascular Assessment Pulses: Posterior Tibial Popliteal Palpable: [Right:No] Extremity colors, hair growth, and conditions: Extremity Color: [Right:Normal] Hair Growth on Extremity: [Right:Yes] Temperature of Extremity: [Right:Cool] Capillary Refill: [Right:< 3 seconds] Electronic Signature(s) Signed: 11/08/2017 4:09:49 PM By: [name] Entered By: [name] on 11/08/2017 08:19:31 Andrew Lyons ([id number]) -------------------------------------------------------------------------------- Multi Wound Chart Details Patient Name: Andrew Lyons Date of Service: 11/08/2017 8:00 AM Medical Record Number: [id number] Patient Account Number: [id number] Date of Birth/Sex:  07-29-1946 (72 y.o. Male) Treating RN: [name] Primary Care Gillian Kluever: [name], Delfino Lovett Other Clinician: Referring Stephanie Mcglone: [name], [name] Treating Dalani Mette/Extender: [name] in Treatment: 2 Vital Signs Height(in): 71 Pulse(bpm): 13 Weight(lbs): 275 Blood Pressure(mmHg): 155/52 Body Mass Index(BMI): 38 Temperature(F): 97.9 Respiratory Rate 18 (breaths/min): Photos: [2:No Photos] [N/A:N/A] Wound Location: [2:Right Amputation Site - Below N/A Knee] Wounding Event: [2:Gradually Appeared] [N/A:N/A] Primary Etiology: [2:Pressure Ulcer] [N/A:N/A] Comorbid History: [2:Cataracts, Chronic Obstructive N/A Pulmonary Disease (COPD), Sleep Apnea, Hypotension, Myocardial Infarction, Type II Diabetes, Osteoarthritis, Neuropathy] Date Acquired: [2:09/24/2017] [N/A:N/A]  Weeks of Treatment: [2:2] [N/A:N/A] Wound Status: [2:Healed - Epithelialized] [N/A:N/A] Measurements L x W x D [2:0x0x0] [N/A:N/A] (cm) Area (cm) : [2:0] [N/A:N/A] Volume (cm) : [2:0] [N/A:N/A] % Reduction in Area: [2:100.00%] [N/A:N/A] % Reduction in Volume: [2:100.00%] [N/A:N/A] Classification: [2:Category/Stage III] [N/A:N/A] Exudate Amount: [2:Large] [N/A:N/A] Exudate Type: [2:Serosanguineous] [N/A:N/A] Exudate Color: [2:red, brown] [N/A:N/A] Wound Margin: [2:Distinct, outline attached] [N/A:N/A] Granulation Amount: [2:Large (67-100%)] [N/A:N/A] Granulation Quality: [2:Pink] [N/A:N/A] Necrotic Amount: [2:None Present (0%)] [N/A:N/A] Epithelialization: [2:None] [N/A:N/A] Periwound Skin Texture: [2:Callus: Yes] [N/A:N/A] Periwound Skin Moisture: [2:No Abnormalities Noted] [N/A:N/A] Periwound Skin Color: [2:No Abnormalities Noted] [N/A:N/A] Temperature: [2:No Abnormality] [N/A:N/A] Tenderness on Palpation: [2:Yes] [N/A:N/A] Wound Preparation: [2:Ulcer Cleansing: Rinsed/Irrigated with Saline] [N/A:N/A] Topical Anesthetic Applied: Other: lidocaine 4% Treatment Notes Electronic  Signature(s) Signed: 11/08/2017 4:45:41 PM By: Linton Ham MD Entered By: Linton Ham on 11/08/2017 08:55:22 Andrew Lyons ([id number]) -------------------------------------------------------------------------------- [place] Details Patient Name: Andrew Lyons Date of Service: 11/08/2017 8:00 AM Medical Record Number: [id number] Patient Account Number: [id number] Date of Birth/Sex: 07-11-1946 (72 y.o. Male) Treating RN: [name] Primary Care Ahja Martello: [name], Delfino Lovett Other Clinician: Referring Mindy Gali: [name], [name] Treating Jireh Elmore/Extender: [name] in Treatment: 2 Active Inactive Electronic Signature(s) Signed: 11/08/2017 4:09:49 PM By: [name] Entered By: [name] on 11/08/2017 08:30:01 Andrew Lyons ([id number]) -------------------------------------------------------------------------------- Pain Assessment Details Patient Name: Andrew Lyons Date of Service: 11/08/2017 8:00 AM Medical Record Number: [id number] Patient Account Number: [id number] Date of Birth/Sex: 1946/05/19 (72 y.o. Male) Treating RN: [name] Primary Care Katia Hannen: [name], Delfino Lovett Other Clinician: Referring Alexandros Ewan: [name], [name] Treating Jael Kostick/Extender: [name] in Treatment: 2 Active Problems Location of Pain Severity and Description of Pain Patient Has Paino No Site Locations Pain Management and Medication Current Pain Management: Notes Topical or injectable lidocaine is offered to patient for acute pain when surgical debridement is performed. If needed, Patient is instructed to use over the counter pain medication for the following 24-48 hours after debridement. Wound care MDs do not prescribed pain medications. Patient has chronic pain or uncontrolled pain. Patient has been instructed to make an appointment with their Primary Care Physician for pain management. Electronic  Signature(s) Signed: 11/08/2017 4:09:49 PM By: [name] Entered By: [name] on 11/08/2017 68:34:19 Andrew Lyons ([id number]) -------------------------------------------------------------------------------- Patient/Caregiver Education Details Patient Name: Andrew Lyons Date of Service: 11/08/2017 8:00 AM Medical Record Number: [id number] Patient Account Number: [id number] Date of Birth/Gender: [birth year] (72 y.o. Male) Treating RN: [name] Primary Care Physician: [name], Delfino Lovett Other Clinician: Referring Physician: [name] Treating Physician/Extender: [name] in Treatment: 2 Education Assessment Education Provided To: Patient Education Topics Provided Basic Hygiene: Handouts: Other: care of newly healed ulcer site and f/u with prosthetics Methods: Explain/Verbal Responses: State content correctly Electronic Signature(s) Signed: 11/08/2017 4:09:49 PM By: [name] Entered By: [name] on 11/08/2017 09:45:58 Andrew Lyons ([id number]) -------------------------------------------------------------------------------- Wound Assessment Details Patient Name: Andrew Lyons Date of Service: 11/08/2017 8:00 AM Medical Record Number: [id number] Patient Account Number: [id number] Date of Birth/Sex: 05/02/1946 (72 y.o. 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 Wound Healed - Epithelialized Wounding Event: Gradually Appeared Status: Date Acquired: 09/24/2017 Comorbid Cataracts, Chronic Obstructive Pulmonary Weeks Of Treatment: 2 History: Disease (COPD), Sleep Apnea,  Hypotension, Clustered Wound: No Myocardial Infarction, Type II  Diabetes, Osteoarthritis, Neuropathy Photos Photo Uploaded By: [name] on 11/08/2017 10:31:08 Wound Measurements Length: (cm) Width: (cm) Depth: (cm) Area: (cm) Volume: (cm) 0 % Reduction in Area: 100% 0 % Reduction in Volume: 100% 0 Epithelialization: None 0 Tunneling: No 0 Undermining: No Wound Description Classification: Category/Stage III Wound Margin: Distinct, outline attached Exudate Amount: Large Exudate Type: Serosanguineous Exudate Color: red, brown Foul Odor After Cleansing: No Slough/Fibrino No Wound Bed Granulation Amount: Large (67-100%) Granulation Quality: Pink Necrotic Amount: None Present (0%) Periwound Skin Texture Texture Color No Abnormalities Noted: No No Abnormalities Noted: No Callus: Yes Temperature / Pain Bruhn, Andrew T. ([id number]) Moisture Temperature: No Abnormality No Abnormalities Noted: No Tenderness on Palpation: Yes Wound Preparation Ulcer Cleansing: Rinsed/Irrigated with Saline Topical Anesthetic Applied: Other: lidocaine 4%, Electronic Signature(s) Signed: 11/08/2017 4:09:49 PM By: [name] Entered By: [name] on 11/08/2017 08:29:43 Andrew Lyons ([id number]) -------------------------------------------------------------------------------- Vitals Details Patient Name: Andrew Lyons Date of Service: 11/08/2017 8:00 AM Medical Record Number: [id number] Patient Account Number: [id number] Date of Birth/Sex: 1946-01-07 (72 y.o. Male) Treating RN: [name] Primary Care Winston Sobczyk: [name], Delfino Lovett Other Clinician: Referring Icesis Renn: [name], [name] Treating Areebah Meinders/Extender: [name] in Treatment: 2 Vital Signs Time Taken: 08:08 Temperature (F): 97.9 Height (in): 71 Pulse (bpm): 58 Weight (lbs): 275 Respiratory Rate (breaths/min): 18 Body Mass Index (BMI): 38.4 Blood Pressure (mmHg): 155/52 Reference Range: 80 - 120 mg / dl Electronic Signature(s) Signed:  11/08/2017 4:09:49 PM By: [name] Entered By: [name] on 11/08/2017 08:09:18

## 2017-11-09 NOTE — Progress Notes (Signed)
KHALE, NIGH ([id number]) Visit Report for 11/08/2017 HPI Details Patient Name: Andrew Lyons, Andrew Lyons Date of Service: 11/08/2017 8:00 AM Medical Record Number: [id number] Patient Account Number: [id number] Date of Birth/Sex: 1946/04/08 (72 y.o. Male) Treating RN: [name] Primary Care Provider: [name], Delfino Lovett Other Clinician: Referring Provider: [name], [name] Treating Provider/Extender: [name] in Treatment: 2 History of Present Illness HPI Description: 01/04/17; this is a 72 year old diabetic man who has a remote history of a traumatic lower extremity damage at age 65 requiring an amputation. He tells me spent 2 years walking on crutches then ultimately has been walking on prosthesis without any trouble since then. Several months ago he had a new prosthesis and developed an open area in the right stump in December. He saw podiatry Dr. Prudence Davidson who noted that this was really out of his practice jurisdiction and referred him here. He has been using mupirocin. He has continued to walk on the prosthesis. The prosthesis itself as been adjusted by the prostatitis. He does not have a known arterial issue. He tells me he probably has diabetic neuropathy. He had a wound that took a long time to heal surrounding the actual amputation itself however is not had more recent wounds. 01/10/17 Culture from last week grew pseudomonas. change AB to cefdinir yesterday from doxy. he is not using his prosthesis which I emphasized although he is having it adjusted next week 01/17/17; he is completing his antibiotics in the next day or 2. We've been using silver alginate. His prosthesis is away being readjusted. He had an x-ray of the underlying bone here apparently at his podiatrist's office although I'll need to have a look at that and we can't find the result then he may need this re-x-rayed. 01/24/17; he has completed his antibiotics. Change to Silver collagen last week. No real  change in the wound this week. 01/31/17 repeat culture I did last week showed a few Pseudomonas. This only has intermediate sensitivity to quinolones I therefore put him back on a week of Cefdinir. X-ray of the stump did not show any bone destruction. We are attempting to get vascular studies done through Dr.Arida's office on May 23. 02/07/17; arterial studies are booked for May 23 no major change. He has completed his antibiotics 02/14/17 arterial studies on Thursday 5/23. using collagen 02/21/17; the patient had his arterial studies. Both his ABIs were good at 1.0 on the right and 1.1 on the left. His abdominal and iliac arteries revealed atherosclerosis without focal stenosis duplex imaging of the right lower leg revealed heterogeneous plaque and biphasic Doppler waveforms from the right CFA to the popliteal artery. The right distal SFA and tibial peroneal trunk velocities were elevated. 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 The proximal right ATA and PTA were patent above the amputation level. We have been using Silver collagen with some improvement in the wound dimensions 02/28/17; we went ahead and did TCOM measurements on this patient. One of the leads was nonfunctional however on the right leg both the proximal and periwound leads registered greater than 70 mmHg oxygen tension. Interestingly the comparison lead on the left leg/relative wound level was in the mid 40s. This allays any fears I had about contributing wound ischemia. In the meantime is measurements today were 0.2 x 0.4 x 0.7 the wound is now a small divot most of it appears to be epithelialized with only a small open area at that depth. There is no evidence of surrounding infection  03/07/17; continued improvement down to 0.3 cm in depth. This is a very tiny circular opening. No drainage no tenderness. Looks like it is been progressing towards closure. He has been using Silver collagen although I think this is too  small to use this as a primary dressing. 03/14/17; still 0.3 cm in depth tiny circular wound. No drainage or tenderness. Used Iodosorb ointment starting last week 03/21/17; still the depth and this wound. Using a #3 curet I remove necrotic material from the wound base. There is a divot here. Not making enough improvement on the Iodosorb, I change back to silver collagen this week. 03/28/17; depth down to 0.2. I cannot actually see a surface of this wound. We've been using silver collagen which I will continue 04/04/17 on evaluation today patient appears to be doing very well. In fact his wound appears to be completely healed though there is a slight eschar over the area I see no fluctuance underneath and I am reluctant to attempt scraping on the area due to the very center superficial nature of the eschar. Patient has no discomfort. Andrew Lyons, Andrew Lyons ([id number]) 04/11/17; the patient went back to [facility] to his prosthetist however his leg was too swollen on the right deficient into the prosthesis. The wound itself on the tip of his stump on the right is closed and was close last week. I see no reason to be concerned about this. READMISSION 10/25/17; this is a patient that we had in clinic from April to July 2018 with a probing tiny open area on his right below sided below-knee amputation site. X-ray of this area did not show osteomyelitis. I had some concern about this being ischemic for a while although we did ABIs and T Coms that did a lot to allay these concerns. The patient is a type II diabetic although I don't have a recent hemoglobin A1c. He tells me that after he left the clinic last time he got a new prosthesis, not the prosthesis that originally caused wounds in this area. He states about a month ago used noted some dark discoloration in his skin or at least his wife did when he was getting out of the shower. He since has noted increasing pain and tenderness when he walks around this  area. He arrives in clinic with no open wound 11/01/17; small probing wound on the medial aspect of the stump from a distal amputation site remotely. Surprisingly culture I did last week showed gram-positive cocci but did not culture. X-ray showed edema but no bony abnormality no soft tissue gas etc. we've been using silver alginate. He is offloading this using a scooter 11/08/17; small probing wound on the medial aspect of his stump from a distal amputation done remotely. He arrived in here with a small probing hold the did not go to bone. Culture I did of the discharged from here was negative. Nevertheless I gave him a week's worth of Augmentin wondering whether this could've been anaerobic. He is not having any pain versus when he came in here. He does have callus on the medial aspect of the stump and in my mind this represents excess pressure on this area when he walks. The area has completely closed once again Electronic Signature(s) Signed: 11/08/2017 4:45:41 PM By: Linton Ham MD Entered By: Linton Ham on 11/08/2017 08:57:16 Andrew Lyons ([id number]) -------------------------------------------------------------------------------- Physical Exam Details Patient Name: Andrew Lyons Date of Service: 11/08/2017 8:00 AM Medical Record Number: [id number] Patient Account Number: [id number] Date of  Birth/Sex: 08/22/1946 (72 y.o. Male) Treating RN: [name] Primary Care Provider: [name], Delfino Lovett Other Clinician: Referring Provider: [name], [name] Treating Provider/Extender: [name] in Treatment: 2 Constitutional Patient is hypertensive.. Pulse regular and within target range for patient.Marland Kitchen Respirations regular, non-labored and within target range.. Temperature is normal and within the target range for the patient.Marland Kitchen appears in no distress. Notes Wound exam; the patient has a distal right below-knee amputation. On the medial aspect of the stump is  the small probing area that we identified on both occasions he is been here. When he came in this time there was a hot mustard looking substance they came out of this. Surprisingly culture was negative. He had some tenderness but that resolved with opening the area. As usual he is surrounded by callus which I removed and the wound is closed with normal-looking skin however the callus itself suggests to me excess direct and friction pressure Electronic Signature(s) Signed: 11/08/2017 4:45:41 PM By: Linton Ham MD Entered By: Linton Ham on 11/08/2017 08:58:46 Andrew Lyons ([id number]) -------------------------------------------------------------------------------- Physician Orders Details Patient Name: Andrew Lyons Date of Service: 11/08/2017 8:00 AM Medical Record Number: [id number] Patient Account Number: [id number] Date of Birth/Sex: 1946-06-02 (72 y.o. Male) Treating RN: [name] Primary Care Provider: [name], Delfino Lovett Other Clinician: Referring Provider: [name], [name] Treating Provider/Extender: [name] in Treatment: 2 Verbal / Phone Orders: No Diagnosis Coding Discharge From [facility] Services o Discharge from [facility] - continue with your scooter for at least another week and protect area with foam bandage Electronic Signature(s) Signed: 11/08/2017 4:09:49 PM By: [name] Signed: 11/08/2017 4:45:41 PM By: Linton Ham MD Entered By: [name] on 11/08/2017 08:30:49 Andrew Lyons ([id number]) -------------------------------------------------------------------------------- Problem List Details Patient Name: Andrew Lyons Date of Service: 11/08/2017 8:00 AM Medical Record Number: [id number] Patient Account Number: [id number] Date of Birth/Sex: Jan 19, 1946 (72 y.o. 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 Male) Treating RN: [name] Primary Care Provider: [name], Delfino Lovett Other Clinician: Referring Provider: [name], [name] Treating Provider/Extender: [name] in Treatment: 2 Subjective History of Present Illness (HPI) 01/04/17; this is a 72 year old diabetic man who has a remote history of a traumatic lower extremity damage at age 64 requiring an amputation. He tells me spent 2 years walking on crutches then ultimately has been walking on prosthesis without any trouble since then. Several months ago he had a new prosthesis and developed an open area in the right stump in December. He saw podiatry Dr. Prudence Davidson who noted that this was really out of his practice jurisdiction and referred him here. He has been using mupirocin. He has continued to walk on the prosthesis. The prosthesis itself as been adjusted by the prostatitis.  He does not have a known arterial issue. He tells me he probably has diabetic neuropathy. He had a wound that took a long time to heal surrounding  the actual amputation itself however is not had more recent wounds. 01/10/17 Culture from last week grew pseudomonas. change AB to cefdinir yesterday from doxy. he is not using his prosthesis which I emphasized although he is having it adjusted next week 01/17/17; he is completing his antibiotics in the next day or 2. We've been using silver alginate. His prosthesis is away being readjusted. He had an x-ray of the underlying bone here apparently at his podiatrist's office although I'll need to have a look at that and we can't find the result then he may need this re-x-rayed. 01/24/17; he has completed his antibiotics. Change to Silver collagen last week. No real change in the wound this week. 01/31/17 repeat culture I did last week showed a few Pseudomonas. This only has intermediate sensitivity to quinolones I therefore put him back on a week of Cefdinir. X-ray of the stump did not show any bone destruction. We are attempting to get vascular studies done through Dr.Arida's office on May 23. 02/07/17; arterial studies are booked for May 23 no major change. He has completed his antibiotics 02/14/17 arterial studies on Thursday 5/23. using collagen 02/21/17; the patient had his arterial studies. Both his ABIs were good at 1.0 on the right and 1.1 on the left. His abdominal and iliac arteries revealed atherosclerosis without focal stenosis duplex imaging of the right lower leg revealed heterogeneous plaque and biphasic Doppler waveforms from the right CFA to the popliteal artery. The right distal SFA and tibial peroneal trunk velocities were elevated. 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 The proximal right ATA and PTA were patent above the amputation level. We have been using Silver collagen with some improvement in the wound dimensions 02/28/17; we went ahead and did TCOM measurements on this patient. One of the leads was nonfunctional however on the right leg both the proximal and  periwound leads registered greater than 70 mmHg oxygen tension. Interestingly the comparison lead on the left leg/relative wound level was in the mid 40s. This allays any fears I had about contributing wound ischemia. In the meantime is measurements today were 0.2 x 0.4 x 0.7 the wound is now a small divot most of it appears to be epithelialized with only a small open area at that depth. There is no evidence of surrounding infection 03/07/17; continued improvement down to 0.3 cm in depth. This is a very tiny circular opening. No drainage no tenderness. Looks like it is been progressing towards closure. He has been using Silver collagen although I think this is too small to use this as a primary dressing. 03/14/17; still 0.3 cm in depth tiny circular wound. No drainage or tenderness. Used Iodosorb ointment starting last week 03/21/17; still the depth and this wound. Using a #3 curet I remove necrotic material from the wound base. There is a divot here. Not making enough improvement on the Iodosorb, I change back to silver collagen this week. 03/28/17; depth down to 0.2. I cannot actually see a surface of this wound. We've been using silver collagen which I will continue 04/04/17 on evaluation today patient appears to be doing very well. In fact his wound appears to be completely healed though there is a slight eschar over the area I see no fluctuance underneath and I am reluctant to attempt scraping on the area  due to the very center superficial nature of the eschar. Patient has no discomfort. 04/11/17; the patient went back to [facility] to his prosthetist however his leg was too swollen on the right deficient into the prosthesis. The wound itself on the tip of his stump on the right is closed and was close last week. I see no reason to be Andrew Lyons, Andrew T. ([id number]) concerned about this. READMISSION 10/25/17; this is a patient that we had in clinic from April to July 2018 with a probing tiny  open area on his right below sided below-knee amputation site. X-ray of this area did not show osteomyelitis. I had some concern about this being ischemic for a while although we did ABIs and T Coms that did a lot to allay these concerns. The patient is a type II diabetic although I don't have a recent hemoglobin A1c. He tells me that after he left the clinic last time he got a new prosthesis, not the prosthesis that originally caused wounds in this area. He states about a month ago used noted some dark discoloration in his skin or at least his wife did when he was getting out of the shower. He since has noted increasing pain and tenderness when he walks around this area. He arrives in clinic with no open wound 11/01/17; small probing wound on the medial aspect of the stump from a distal amputation site remotely. Surprisingly culture I did last week showed gram-positive cocci but did not culture. X-ray showed edema but no bony abnormality no soft tissue gas etc. we've been using silver alginate. He is offloading this using a scooter 11/08/17; small probing wound on the medial aspect of his stump from a distal amputation done remotely. He arrived in here with a small probing hold the did not go to bone. Culture I did of the discharged from here was negative. Nevertheless I gave him a week's worth of Augmentin wondering whether this could've been anaerobic. He is not having any pain versus when he came in here. He does have callus on the medial aspect of the stump and in my mind this represents excess pressure on this area when he walks. The area has completely closed once again Objective Constitutional Patient is hypertensive.. Pulse regular and within target range for patient.Marland Kitchen Respirations regular, non-labored and within target range.. Temperature is normal and within the target range for the patient.Marland Kitchen appears in no distress. Vitals Time Taken: 8:08 AM, Height: 71 in, Weight: 275 lbs, BMI: 38.4,  Temperature: 97.9 F, Pulse: 58 bpm, Respiratory Rate: 18 breaths/min, Blood Pressure: 155/52 mmHg. General Notes: Wound exam; the patient has a distal right below-knee amputation. On the medial aspect of the stump is the small probing area that we identified on both occasions he is been here. When he came in this time there was a hot mustard looking substance they came out of this. Surprisingly culture was negative. He had some tenderness but that resolved with opening the area. As usual he is surrounded by callus which I removed and the wound is closed with normal-looking skin however the callus itself suggests to me excess direct and friction pressure Integumentary (Hair, Skin) Wound #2 status is Healed - Epithelialized. Original cause of wound was Gradually Appeared. 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 The wound measures 0cm length x 0cm width x 0cm depth; 0cm^2 area and 0cm^3 volume. There is no tunneling or undermining noted. There is a large amount of serosanguineous drainage  noted. The wound margin is distinct with the outline attached to the wound base. There is large (67-100%) pink granulation within the wound bed. There is no necrotic tissue within the wound bed. The periwound skin appearance exhibited: Callus. Periwound temperature was noted as No Abnormality. The periwound has tenderness on palpation. 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, right lower leg, subsequent encounter Plan Discharge From [facility] Services: Discharge from [facility] - continue with your scooter for at least another week and protect area with foam bandage o #1 the patient to be discharged from the wound care center #2I asked him to keep this  dressed with alginate and border foam for another week #3 asked him to offload this with his scooter for another week before he reattempts his prosthesis and before that to see his prostatitis who was in [place] #4 I think this all represents excess pressure on this part of his stump and I'm not sure why that occurs in spite of the efforts that the patient says it been put into fixing this. From a medical point of view I have wondered about doing an MRI and if he reappears I'll probably do that to check the underlying bone in this area and soft tissues however right now I am quite confident that this is all a pressure issue Electronic Signature(s) Signed: 11/08/2017 4:45:41 PM By: Linton Ham MD Entered By: Linton Ham on 11/08/2017 09:01:45 Andrew Lyons ([id number]) -------------------------------------------------------------------------------- SuperBill Details Patient Name: Andrew Lyons Date of Service: 11/08/2017 Medical Record Number: [id number] Patient Account Number: [id number] Date of Birth/Sex: 1946/05/14 (72 y.o. 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

## 2017-12-25 ENCOUNTER — Encounter: Payer: Self-pay | Admitting: Family Medicine

## 2017-12-25 ENCOUNTER — Ambulatory Visit (INDEPENDENT_AMBULATORY_CARE_PROVIDER_SITE_OTHER): Payer: PPO | Admitting: Family Medicine

## 2017-12-25 VITALS — BP 132/60 | HR 60 | Temp 97.9°F | Resp 16 | Wt 267.0 lb

## 2017-12-25 DIAGNOSIS — Z6838 Body mass index (BMI) 38.0-38.9, adult: Secondary | ICD-10-CM | POA: Diagnosis not present

## 2017-12-25 DIAGNOSIS — E118 Type 2 diabetes mellitus with unspecified complications: Secondary | ICD-10-CM | POA: Diagnosis not present

## 2017-12-25 DIAGNOSIS — E7800 Pure hypercholesterolemia, unspecified: Secondary | ICD-10-CM

## 2017-12-25 DIAGNOSIS — I10 Essential (primary) hypertension: Secondary | ICD-10-CM

## 2017-12-25 DIAGNOSIS — I2510 Atherosclerotic heart disease of native coronary artery without angina pectoris: Secondary | ICD-10-CM

## 2017-12-25 DIAGNOSIS — Z952 Presence of prosthetic heart valve: Secondary | ICD-10-CM

## 2017-12-25 LAB — POCT GLYCOSYLATED HEMOGLOBIN (HGB A1C): HEMOGLOBIN A1C: 7.8

## 2017-12-25 NOTE — Progress Notes (Signed)
Patient: Andrew Lyons Male    DOB: 1945-11-10   72 y.o.   MRN: [id number] Visit Date: 12/25/2017  Today's Provider: [name], MD   Chief Complaint  Patient presents with  . Diabetes   Subjective:    HPI  Diabetes Mellitus Type II, Follow-up:   Lab Results  Component Value Date   HGBA1C 8.3 (H) 09/21/2017   HGBA1C 6.5 05/22/2017   HGBA1C 6.6 01/23/2017    Last seen for diabetes 3 months ago.  Management since then includes none. He reports good compliance with treatment. He is not having side effects.  Home blood sugar records: not being checked  Episodes of hypoglycemia? no   Current Insulin Regimen: 25 units a week. Current exercise: none  Pertinent Labs:    Component Value Date/Time   CHOL 133 09/21/2017 0825   CHOL 144 05/17/2016 0706   TRIG 205 (H) 09/21/2017 0825   HDL 32 (L) 09/21/2017 0825   HDL 34 (L) 05/17/2016 0706   LDLCALC 71 09/21/2017 0825   CREATININE 1.26 05/17/2016 0706    Wt Readings from Last 3 Encounters:  12/25/17 267 lb (121.1 kg)  10/05/17 275 lb (124.7 kg)  09/20/17 275 lb (124.7 kg)    ------------------------------------------------------------------------      Allergies  Allergen Reactions  . Iodinated Diagnostic Agents Rash    had a red chest -unusre of what kind of dye it was  . Zyban [Bupropion]   . Zocor [Simvastatin]      Current Outpatient Medications:  .  amLODipine-benazepril (LOTREL) 5-40 MG capsule, Take 1 capsule by mouth daily., Disp: 90 capsule, Rfl: 3 .  aspirin 325 MG EC tablet, Take 325 mg by mouth daily., Disp: , Rfl:  .  B-D UF III MINI PEN NEEDLES 31G X 5 MM MISC, USE WITH PEN DAILY, Disp: 90 each, Rfl: 3 .  Blood Glucose Monitoring Suppl (ONE TOUCH ULTRA SYSTEM KIT) w/Device KIT, To use daily to check sugar. DX E11.9-needs one touch ultra meter, Disp: 1 each, Rfl: 0 .  co-enzyme Q-10 30 MG capsule, Take 100 mg by mouth daily., Disp: , Rfl:  .  empagliflozin (JARDIANCE) 25 MG  TABS tablet, Take 25 mg by mouth daily., Disp: 30 tablet, Rfl: 12 .  fluticasone (FLONASE) 50 MCG/ACT nasal spray, USE 2 SPRAYS IN EACH NOSTRIL EVERY DAY, Disp: 48 g, Rfl: 12 .  glucose blood (ONE TOUCH ULTRA TEST) test strip, Check sugar 3 times daily. DX E11.9-strips for one touch ultra meter, Disp: 100 each, Rfl: 11 .  metFORMIN (GLUCOPHAGE) 500 MG tablet, Take 500 mg by mouth 2 (two) times daily., Disp: , Rfl:  .  metFORMIN (GLUCOPHAGE) 500 MG tablet, TAKE 1 TABLET (500 MG TOTAL) BY MOUTH 2 (TWO) TIMES DAILY WITH A MEAL., Disp: 180 tablet, Rfl: 3 .  metoprolol succinate (TOPROL-XL) 100 MG 24 hr tablet, TAKE 1 TABLET BY MOUTH EVERY DAY, Disp: 90 tablet, Rfl: 3 .  montelukast (SINGULAIR) 10 MG tablet, TAKE 1 TABLET BY MOUTH EVERY DAY, Disp: 90 tablet, Rfl: 3 .  Multiple Vitamin (MULTIVITAMIN) capsule, Take 1 capsule by mouth daily. , Disp: , Rfl:  .  omeprazole (PRILOSEC) 40 MG capsule, TAKE ONE CAPSULE BY MOUTH EVERY DAY, Disp: 90 capsule, Rfl: 3 .  ONETOUCH DELICA LANCETS FINE MISC, 1 Device by Does not apply route 3 (three) times daily., Disp: 100 each, Rfl: 11 .  rosuvastatin (CRESTOR) 20 MG tablet, TAKE 1 TABLET BY MOUTH EVERY DAY,  Disp: 90 tablet, Rfl: 2 .  sildenafil (VIAGRA) 100 MG tablet, USE AS DIRECTED, Disp: , Rfl:  .  tamsulosin (FLOMAX) 0.4 MG CAPS capsule, TAKE 1 CAPSULE (0.4 MG TOTAL) BY MOUTH DAILY., Disp: 90 capsule, Rfl: 3 .  VICTOZA 18 MG/3ML SOPN, INJECT 0.6 SUBCUTANEOUSLY EVERY DAY (Patient taking differently: INJECT 1.2 SUBCUTANEOUSLY EVERY DAY), Disp: 3 pen, Rfl: 3 .  VITAMIN C, CALCIUM ASCORBATE, PO, Take by mouth daily., Disp: , Rfl:  .  amoxicillin (AMOXIL) 500 MG capsule, Take 1 capsule (500 mg total) by mouth 3 (three) times daily. (Patient not taking: Reported on 10/05/2017), Disp: 30 capsule, Rfl: 0  Review of Systems  Constitutional: Negative.   HENT: Negative.   Eyes: Negative.   Respiratory: Negative.   Cardiovascular: Negative.   Gastrointestinal: Negative.    Endocrine: Negative.   Genitourinary: Negative.   Musculoskeletal: Negative.   Skin: Negative.   Allergic/Immunologic: Negative.   Neurological: Negative.   Hematological: Negative.   Psychiatric/Behavioral: Negative.     Social History   Tobacco Use  . Smoking status: Former Research scientist (life sciences)  . Smokeless tobacco: Never Used  . Tobacco comment: Quit smoking in 2003; Started smoking at age 73, smoked about 40 years, smoked over 3 packs per day  Substance Use Topics  . Alcohol use: Yes    Comment: Occcasional- beer   Objective:   BP 132/60 (BP Location: Left Arm, Patient Position: Sitting, Cuff Size: Large)   Pulse 60   Temp 97.9 F (36.6 C) (Oral)   Resp 16   Wt 267 lb (121.1 kg)   SpO2 97%   BMI 38.31 kg/m  Vitals:   12/25/17 0859  BP: 132/60  Pulse: 60  Resp: 16  Temp: 97.9 F (36.6 C)  TempSrc: Oral  SpO2: 97%  Weight: 267 lb (121.1 kg)     Physical Exam  Constitutional: He is oriented to person, place, and time. He appears well-developed and well-nourished.  HENT:  Head: Normocephalic and atraumatic.  Eyes: Conjunctivae are normal.  Neck: No thyromegaly present.  Cardiovascular: Normal rate, regular rhythm and normal heart sounds.  Pulmonary/Chest: Effort normal and breath sounds normal.  Abdominal: Soft.  Neurological: He is alert and oriented to person, place, and time.  Skin: Skin is warm and dry.  Psychiatric: He has a normal mood and affect. His behavior is normal. Judgment and thought content normal.        Assessment & Plan:      1. Type 2 diabetes mellitus with complication, without long-term current use of insulin (HCC) Improving.RTC 3-4 months. - POCT HgB A1C--7.8 2.Obesity 3.s/p right BKA Getting new sleeve for prosthesis. 4.OSA 5.s/p Aortic Valve replacement 6.CAD Clinically stable.     I have done the exam and reviewed the above chart and it is accurate to the best of my knowledge. Development worker, community has been used in this note in any  air is in the dictation or transcription are unintentional.  [name], MD  [place]

## 2018-01-02 ENCOUNTER — Other Ambulatory Visit: Payer: Self-pay | Admitting: Family Medicine

## 2018-01-04 ENCOUNTER — Other Ambulatory Visit: Payer: Self-pay | Admitting: Family Medicine

## 2018-01-04 DIAGNOSIS — J301 Allergic rhinitis due to pollen: Secondary | ICD-10-CM

## 2018-01-04 MED ORDER — MONTELUKAST SODIUM 10 MG PO TABS: 10.0000 mg | ORAL_TABLET | Freq: Every day | ORAL | 3 refills | Status: DC

## 2018-01-04 NOTE — Telephone Encounter (Signed)
[facility] faxed a refill request for the following medication. Thanks CC  montelukast (SINGULAIR) 10 MG tablet

## 2018-01-08 ENCOUNTER — Other Ambulatory Visit: Payer: Self-pay | Admitting: Family Medicine

## 2018-01-15 ENCOUNTER — Other Ambulatory Visit: Payer: Self-pay | Admitting: Family Medicine

## 2018-01-26 ENCOUNTER — Telehealth: Payer: Self-pay | Admitting: Family Medicine

## 2018-01-26 NOTE — Telephone Encounter (Signed)
Ok to do

## 2018-01-26 NOTE — Telephone Encounter (Signed)
Patient needs prescription for Cpap mask and supplies with a diagnosis sent to [facility] at [facility] [phone]. Please do asap as he is to be fitted on Monday.

## 2018-01-26 NOTE — Telephone Encounter (Signed)
Please review. Thanks!  

## 2018-01-29 ENCOUNTER — Other Ambulatory Visit: Payer: Self-pay

## 2018-01-29 NOTE — Telephone Encounter (Signed)
Done, awaiting provider signature

## 2018-02-11 ENCOUNTER — Other Ambulatory Visit: Payer: Self-pay | Admitting: Family Medicine

## 2018-03-22 ENCOUNTER — Other Ambulatory Visit: Payer: Self-pay | Admitting: Family Medicine

## 2018-03-31 ENCOUNTER — Other Ambulatory Visit: Payer: Self-pay | Admitting: Family Medicine

## 2018-04-17 DIAGNOSIS — I10 Essential (primary) hypertension: Secondary | ICD-10-CM | POA: Diagnosis not present

## 2018-04-17 DIAGNOSIS — G4733 Obstructive sleep apnea (adult) (pediatric): Secondary | ICD-10-CM | POA: Diagnosis not present

## 2018-04-17 DIAGNOSIS — I2510 Atherosclerotic heart disease of native coronary artery without angina pectoris: Secondary | ICD-10-CM | POA: Diagnosis not present

## 2018-04-17 DIAGNOSIS — E782 Mixed hyperlipidemia: Secondary | ICD-10-CM | POA: Diagnosis not present

## 2018-04-17 DIAGNOSIS — Q231 Congenital insufficiency of aortic valve: Secondary | ICD-10-CM | POA: Diagnosis not present

## 2018-04-26 ENCOUNTER — Encounter: Payer: Self-pay | Admitting: Family Medicine

## 2018-04-26 ENCOUNTER — Ambulatory Visit (INDEPENDENT_AMBULATORY_CARE_PROVIDER_SITE_OTHER): Payer: PPO | Admitting: Family Medicine

## 2018-04-26 VITALS — BP 132/64 | HR 62 | Temp 98.3°F | Resp 16 | Ht 70.0 in | Wt 267.0 lb

## 2018-04-26 DIAGNOSIS — G4733 Obstructive sleep apnea (adult) (pediatric): Secondary | ICD-10-CM | POA: Diagnosis not present

## 2018-04-26 DIAGNOSIS — E118 Type 2 diabetes mellitus with unspecified complications: Secondary | ICD-10-CM | POA: Diagnosis not present

## 2018-04-26 DIAGNOSIS — I10 Essential (primary) hypertension: Secondary | ICD-10-CM

## 2018-04-26 DIAGNOSIS — Z6838 Body mass index (BMI) 38.0-38.9, adult: Secondary | ICD-10-CM | POA: Diagnosis not present

## 2018-04-26 LAB — POCT GLYCOSYLATED HEMOGLOBIN (HGB A1C): Hemoglobin A1C: 9 % — AB (ref 4.0–5.6)

## 2018-04-26 NOTE — Progress Notes (Addendum)
Patient: Andrew Lyons Male    DOB: 1946/07/28   72 y.o.   MRN: [id number] Visit Date: 04/26/2018  Today's Provider: [name], MD   Chief Complaint  Patient presents with  . Diabetes  . Sleep Apnea   Subjective:    HPI  Diabetes Mellitus Type II, Follow-up:   Lab Results  Component Value Date   HGBA1C 9.0 (A) 04/26/2018   HGBA1C 7.8 12/25/2017   HGBA1C 8.3 (H) 09/21/2017    Last seen for diabetes 4 months ago.  Management since then includes . He reports good compliance with treatment. He is not having side effects.  Current symptoms include none and have been stable. Home blood sugar records: trend: stable  Episodes of hypoglycemia? no   Current Insulin Regimen: none  Most Recent Eye Exam: 09/2017. Weight trend: stable Prior visit with dietician: no Current diet: well balanced Current exercise: no regular exercise  Pertinent Labs:    Component Value Date/Time   CHOL 133 09/21/2017 0825   CHOL 144 05/17/2016 0706   TRIG 205 (H) 09/21/2017 0825   HDL 32 (L) 09/21/2017 0825   HDL 34 (L) 05/17/2016 0706   LDLCALC 71 09/21/2017 0825   CREATININE 1.26 05/17/2016 0706    Wt Readings from Last 3 Encounters:  04/26/18 267 lb (121.1 kg)  12/25/17 267 lb (121.1 kg)  10/05/17 275 lb (124.7 kg)    Sleep Apnea Patient is also requesting CPAP supplies. He reports that the CPAP does help him feel well rested. No other issues.   Allergies  Allergen Reactions  . Iodinated Diagnostic Agents Rash    had a red chest -unusre of what kind of dye it was  . Zyban [Bupropion]   . Zocor [Simvastatin]      Current Outpatient Medications:  .  amLODipine-benazepril (LOTREL) 5-40 MG capsule, Take 1 capsule by mouth daily., Disp: 90 capsule, Rfl: 3 .  aspirin 325 MG EC tablet, Take 325 mg by mouth daily., Disp: , Rfl:  .  B-D UF III MINI PEN NEEDLES 31G X 5 MM MISC, USE WITH PEN DAILY, Disp: 100 each, Rfl: 3 .  Blood Glucose Monitoring Suppl (ONE TOUCH  ULTRA SYSTEM KIT) w/Device KIT, To use daily to check sugar. DX E11.9-needs one touch ultra meter, Disp: 1 each, Rfl: 0 .  co-enzyme Q-10 30 MG capsule, Take 100 mg by mouth daily., Disp: , Rfl:  .  fluticasone (FLONASE) 50 MCG/ACT nasal spray, USE 2 SPRAYS IN EACH NOSTRIL EVERY DAY, Disp: 48 g, Rfl: 3 .  glucose blood (ONE TOUCH ULTRA TEST) test strip, Check sugar 3 times daily. DX E11.9-strips for one touch ultra meter, Disp: 100 each, Rfl: 11 .  JARDIANCE 25 MG TABS tablet, TAKE 1 TABLET BY MOUTH DAILY, Disp: 30 tablet, Rfl: 10 .  liraglutide (VICTOZA) 18 MG/3ML SOPN, INJECT 1.2 SUBCUTANEOUSLY EVERY DAY, Disp: 9 pen, Rfl: 3 .  metFORMIN (GLUCOPHAGE) 500 MG tablet, Take 500 mg by mouth 2 (two) times daily., Disp: , Rfl:  .  metFORMIN (GLUCOPHAGE) 500 MG tablet, TAKE 1 TABLET (500 MG TOTAL) BY MOUTH 2 (TWO) TIMES DAILY WITH A MEAL., Disp: 180 tablet, Rfl: 3 .  metoprolol succinate (TOPROL-XL) 100 MG 24 hr tablet, TAKE 1 TABLET BY MOUTH EVERY DAY, Disp: 90 tablet, Rfl: 3 .  montelukast (SINGULAIR) 10 MG tablet, Take 1 tablet (10 mg total) by mouth daily., Disp: 90 tablet, Rfl: 3 .  Multiple Vitamin (MULTIVITAMIN) capsule, Take 1  capsule by mouth daily. , Disp: , Rfl:  .  omeprazole (PRILOSEC) 40 MG capsule, TAKE ONE CAPSULE BY MOUTH EVERY DAY, Disp: 90 capsule, Rfl: 3 .  ONETOUCH DELICA LANCETS FINE MISC, 1 Device by Does not apply route 3 (three) times daily., Disp: 100 each, Rfl: 11 .  rosuvastatin (CRESTOR) 20 MG tablet, TAKE 1 TABLET BY MOUTH EVERY DAY, Disp: 90 tablet, Rfl: 2 .  sildenafil (VIAGRA) 100 MG tablet, USE AS DIRECTED, Disp: , Rfl:  .  tamsulosin (FLOMAX) 0.4 MG CAPS capsule, TAKE 1 CAPSULE (0.4 MG TOTAL) BY MOUTH DAILY., Disp: 90 capsule, Rfl: 3 .  VITAMIN C, CALCIUM ASCORBATE, PO, Take by mouth daily., Disp: , Rfl:  .  amoxicillin (AMOXIL) 500 MG capsule, Take 1 capsule (500 mg total) by mouth 3 (three) times daily. (Patient not taking: Reported on 10/05/2017), Disp: 30 capsule,  Rfl: 0  Review of Systems  Constitutional: Negative for activity change, appetite change, chills, diaphoresis, fatigue, fever and unexpected weight change.  Eyes: Negative.   Respiratory: Negative for cough and shortness of breath.   Cardiovascular: Negative for chest pain, palpitations and leg swelling.  Endocrine: Negative for cold intolerance, heat intolerance, polydipsia, polyphagia and polyuria.  Musculoskeletal: Negative for arthralgias, joint swelling and myalgias.  Allergic/Immunologic: Negative.   Neurological: Negative for dizziness, light-headedness and headaches.  Psychiatric/Behavioral: Negative.     Social History   Tobacco Use  . Smoking status: Former Research scientist (life sciences)  . Smokeless tobacco: Never Used  . Tobacco comment: Quit smoking in 2003; Started smoking at age 16, smoked about 40 years, smoked over 3 packs per day  Substance Use Topics  . Alcohol use: Yes    Comment: Occcasional- beer   Objective:   BP 132/64 (BP Location: Right Arm, Patient Position: Sitting, Cuff Size: Large)   Pulse 62   Temp 98.3 F (36.8 C)   Resp 16   Ht 5' 10" (1.778 m)   Wt 267 lb (121.1 kg)   SpO2 97%   BMI 38.31 kg/m  Vitals:   04/26/18 0936  BP: 132/64  Pulse: 62  Resp: 16  Temp: 98.3 F (36.8 C)  SpO2: 97%  Weight: 267 lb (121.1 kg)  Height: 5' 10" (1.778 m)     Physical Exam  Constitutional: He is oriented to person, place, and time. He appears well-developed and well-nourished.  HENT:  Head: Normocephalic and atraumatic.  Right Ear: External ear normal.  Left Ear: External ear normal.  Nose: Nose normal.  Eyes: Conjunctivae are normal. No scleral icterus.  Neck: No thyromegaly present.  Cardiovascular: Normal rate, regular rhythm and normal heart sounds.  Pulmonary/Chest: Effort normal and breath sounds normal.  Abdominal: Soft.  Musculoskeletal: He exhibits no edema.  Neurological: He is alert and oriented to person, place, and time.  Skin: Skin is warm and dry.    Psychiatric: He has a normal mood and affect. His behavior is normal. Judgment and thought content normal.        Assessment & Plan:     1. Type 2 diabetes mellitus with complication, without long-term current use of insulin (HCC) Insulin next step after lifestyle. - POCT glycosylated hemoglobin (Hb A1C)--9.0 today  2. Essential hypertension   3. Class 2 severe obesity due to excess calories with serious comorbidity and body mass index (BMI) of 38.0 to 38.9 in adult ([place])   4. Obstructive apnea Patient has been using his CPAP nightly for years and gets benefit from it.  He wishes to order  a new machine as his is more than 71 years old.       I have done the exam and reviewed the above chart and it is accurate to the best of my knowledge. Development worker, community has been used in this note in any air is in the dictation or transcription are unintentional.  [name], MD  [place]

## 2018-05-02 DIAGNOSIS — N5201 Erectile dysfunction due to arterial insufficiency: Secondary | ICD-10-CM | POA: Diagnosis not present

## 2018-05-02 DIAGNOSIS — N401 Enlarged prostate with lower urinary tract symptoms: Secondary | ICD-10-CM | POA: Diagnosis not present

## 2018-05-02 DIAGNOSIS — R351 Nocturia: Secondary | ICD-10-CM | POA: Diagnosis not present

## 2018-05-02 DIAGNOSIS — R350 Frequency of micturition: Secondary | ICD-10-CM | POA: Diagnosis not present

## 2018-05-08 DIAGNOSIS — N5201 Erectile dysfunction due to arterial insufficiency: Secondary | ICD-10-CM | POA: Diagnosis not present

## 2018-05-08 DIAGNOSIS — Z125 Encounter for screening for malignant neoplasm of prostate: Secondary | ICD-10-CM | POA: Diagnosis not present

## 2018-05-08 DIAGNOSIS — R351 Nocturia: Secondary | ICD-10-CM | POA: Diagnosis not present

## 2018-05-08 DIAGNOSIS — R350 Frequency of micturition: Secondary | ICD-10-CM | POA: Diagnosis not present

## 2018-05-08 DIAGNOSIS — N401 Enlarged prostate with lower urinary tract symptoms: Secondary | ICD-10-CM | POA: Diagnosis not present

## 2018-05-14 DIAGNOSIS — N5201 Erectile dysfunction due to arterial insufficiency: Secondary | ICD-10-CM | POA: Diagnosis not present

## 2018-05-14 DIAGNOSIS — R350 Frequency of micturition: Secondary | ICD-10-CM | POA: Diagnosis not present

## 2018-05-14 DIAGNOSIS — R351 Nocturia: Secondary | ICD-10-CM | POA: Diagnosis not present

## 2018-05-14 DIAGNOSIS — N401 Enlarged prostate with lower urinary tract symptoms: Secondary | ICD-10-CM | POA: Diagnosis not present

## 2018-05-16 ENCOUNTER — Ambulatory Visit
Admission: RE | Admit: 2018-05-16 | Discharge: 2018-05-16 | Disposition: A | Discharge status: Home / Self Care | Payer: PPO | Source: Ambulatory Visit | Attending: Internal Medicine | Admitting: Internal Medicine

## 2018-05-16 ENCOUNTER — Encounter: Payer: PPO | Attending: Internal Medicine | Admitting: Internal Medicine

## 2018-05-16 ENCOUNTER — Other Ambulatory Visit (HOSPITAL_BASED_OUTPATIENT_CLINIC_OR_DEPARTMENT_OTHER): Payer: Self-pay | Admitting: Internal Medicine

## 2018-05-16 ENCOUNTER — Other Ambulatory Visit
Admission: RE | Admit: 2018-05-16 | Discharge: 2018-05-16 | Disposition: A | Discharge status: Home / Self Care | Payer: PPO | Source: Ambulatory Visit | Attending: Internal Medicine | Admitting: Internal Medicine

## 2018-05-16 DIAGNOSIS — I252 Old myocardial infarction: Secondary | ICD-10-CM | POA: Insufficient documentation

## 2018-05-16 DIAGNOSIS — I10 Essential (primary) hypertension: Secondary | ICD-10-CM | POA: Diagnosis not present

## 2018-05-16 DIAGNOSIS — Z89511 Acquired absence of right leg below knee: Secondary | ICD-10-CM | POA: Insufficient documentation

## 2018-05-16 DIAGNOSIS — S81801A Unspecified open wound, right lower leg, initial encounter: Secondary | ICD-10-CM | POA: Diagnosis not present

## 2018-05-16 DIAGNOSIS — G4730 Sleep apnea, unspecified: Secondary | ICD-10-CM | POA: Insufficient documentation

## 2018-05-16 DIAGNOSIS — J449 Chronic obstructive pulmonary disease, unspecified: Secondary | ICD-10-CM | POA: Insufficient documentation

## 2018-05-16 DIAGNOSIS — S81809A Unspecified open wound, unspecified lower leg, initial encounter: Secondary | ICD-10-CM | POA: Diagnosis not present

## 2018-05-16 DIAGNOSIS — E11622 Type 2 diabetes mellitus with other skin ulcer: Secondary | ICD-10-CM | POA: Diagnosis not present

## 2018-05-16 DIAGNOSIS — E1140 Type 2 diabetes mellitus with diabetic neuropathy, unspecified: Secondary | ICD-10-CM | POA: Insufficient documentation

## 2018-05-16 DIAGNOSIS — L97313 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 of muscle: Secondary | ICD-10-CM | POA: Insufficient documentation

## 2018-05-16 DIAGNOSIS — T8781 Dehiscence of amputation stump: Secondary | ICD-10-CM | POA: Diagnosis not present

## 2018-05-16 DIAGNOSIS — Z87891 Personal history of nicotine dependence: Secondary | ICD-10-CM | POA: Insufficient documentation

## 2018-05-16 DIAGNOSIS — X58XXXA Exposure to other specified factors, initial encounter: Secondary | ICD-10-CM | POA: Insufficient documentation

## 2018-05-16 DIAGNOSIS — M1990 Unspecified osteoarthritis, unspecified site: Secondary | ICD-10-CM | POA: Diagnosis not present

## 2018-05-16 DIAGNOSIS — L89893 Pressure ulcer of other site, stage 3: Secondary | ICD-10-CM | POA: Diagnosis not present

## 2018-05-18 DIAGNOSIS — N401 Enlarged prostate with lower urinary tract symptoms: Secondary | ICD-10-CM | POA: Diagnosis not present

## 2018-05-18 DIAGNOSIS — N5201 Erectile dysfunction due to arterial insufficiency: Secondary | ICD-10-CM | POA: Diagnosis not present

## 2018-05-20 LAB — AEROBIC CULTURE W GRAM STAIN (SUPERFICIAL SPECIMEN)

## 2018-05-20 LAB — AEROBIC CULTURE  (SUPERFICIAL SPECIMEN)

## 2018-05-21 ENCOUNTER — Ambulatory Visit: Payer: Self-pay

## 2018-05-21 ENCOUNTER — Encounter: Payer: Self-pay | Admitting: Family Medicine

## 2018-05-23 ENCOUNTER — Encounter: Payer: PPO | Admitting: Internal Medicine

## 2018-05-23 ENCOUNTER — Other Ambulatory Visit (HOSPITAL_BASED_OUTPATIENT_CLINIC_OR_DEPARTMENT_OTHER): Payer: Self-pay | Admitting: Internal Medicine

## 2018-05-23 DIAGNOSIS — E11622 Type 2 diabetes mellitus with other skin ulcer: Secondary | ICD-10-CM | POA: Diagnosis not present

## 2018-05-23 DIAGNOSIS — L89893 Pressure ulcer of other site, stage 3: Secondary | ICD-10-CM | POA: Diagnosis not present

## 2018-05-23 DIAGNOSIS — L98499 Non-pressure chronic ulcer of skin of other sites with unspecified severity: Principal | ICD-10-CM

## 2018-05-25 ENCOUNTER — Other Ambulatory Visit (HOSPITAL_BASED_OUTPATIENT_CLINIC_OR_DEPARTMENT_OTHER): Payer: Self-pay | Admitting: Internal Medicine

## 2018-05-25 DIAGNOSIS — T8189XA Other complications of procedures, not elsewhere classified, initial encounter: Secondary | ICD-10-CM | POA: Diagnosis not present

## 2018-05-25 DIAGNOSIS — S81801S Unspecified open wound, right lower leg, sequela: Secondary | ICD-10-CM

## 2018-05-25 DIAGNOSIS — L97919 Non-pressure chronic ulcer of unspecified part of right lower leg with unspecified severity: Secondary | ICD-10-CM

## 2018-05-25 NOTE — Progress Notes (Signed)
IZRAEL, PEAK ([id number]) Visit Report for 05/16/2018 Allergy List Details Patient Name: Andrew Lyons, Andrew Lyons Date of Service: 05/16/2018 9:45 AM Medical Record Number: [id number] Patient Account Number: [id number] Date of Birth/Sex: Dec 12, 1945 (72 y.o. M) Treating RN: [name] Primary Care Donica Derouin: [name] Other Clinician: Referring Joella Saefong: [name], [name] Treating Dez Stauffer/Extender: [name] in Treatment: 0 Allergies Active Allergies Zyban Zocor Allergy Notes Electronic Signature(s) Signed: 05/16/2018 4:43:18 PM By: Alric Quan Entered By: Alric Quan on 05/16/2018 10:01:53 Andrew Lyons ([id number]) -------------------------------------------------------------------------------- Arrival Information Details Patient Name: Andrew Lyons Date of Service: 05/16/2018 9:45 AM Medical Record Number: [id number] Patient Account Number: [id number] Date of Birth/Sex: 1945-10-12 (72 y.o. M) Treating RN: [name] Primary Care Zahari Xiang: [name], Delfino Lovett Other Clinician: Referring Mitchelle Goerner: [name] Treating Jayveon Convey/Extender: [name] in Treatment: 0 Visit Information Patient Arrived: Ambulatory Arrival Time: 09:56 Accompanied By: self Transfer Assistance: None Patient Identification Verified: Yes Secondary Verification Process Completed: Yes Patient Requires Transmission-Based No Precautions: Patient Has Alerts: Yes Patient Alerts: DM II History Since Last Visit All ordered tests and consults were completed: No Added or deleted any medications: No Any new allergies or adverse reactions: No Had a fall or experienced change in activities of daily living that may affect risk of falls: No Signs or symptoms of abuse/neglect since last visito No Hospitalized since last visit: No Implantable device outside of the clinic excluding cellular tissue based products placed in the center since  last visit: No Has Dressing in Place as Prescribed: Yes Pain Present Now: No Electronic Signature(s) Signed: 05/16/2018 4:43:18 PM By: Alric Quan Entered By: Alric Quan on 05/16/2018 09:57:25 Andrew Lyons ([id number]) -------------------------------------------------------------------------------- Clinic Level of Care Assessment Details Patient Name: Andrew Lyons Date of Service: 05/16/2018 9:45 AM Medical Record Number: [id number] Patient Account Number: [id number] Date of Birth/Sex: 03-02-1946 (72 y.o. M) Treating RN: [name] Primary Care Kemba Hoppes: [name], Delfino Lovett Other Clinician: Referring Brahim Dolman: [name] Treating Christene Pounds/Extender: [name] in Treatment: 0 Clinic Level of Care Assessment Items TOOL 1 Quantity Score []  - Use when EandM and Procedure is performed on INITIAL visit 0 ASSESSMENTS - Nursing Assessment / Reassessment X - General Physical Exam (combine w/ comprehensive assessment (listed just below) when 1 20 performed on new pt. evals) X- 1 25 Comprehensive Assessment (HX, ROS, Risk Assessments, Wounds Hx, etc.) ASSESSMENTS - Wound and Skin Assessment / Reassessment []  - Dermatologic / Skin Assessment (not related to wound area) 0 ASSESSMENTS - Ostomy and/or Continence Assessment and Care []  - Incontinence Assessment and Management 0 []  - 0 Ostomy Care Assessment and Management (repouching, etc.) PROCESS - Coordination of Care X - Simple Patient / Family Education for ongoing care 1 15 []  - 0 Complex (extensive) Patient / Family Education for ongoing care X- 1 10 Staff obtains Programmer, systems, Records, Test Results / Process Orders []  - 0 Staff telephones HHA, Nursing Homes / Clarify orders / etc []  - 0 Routine Transfer to another Facility (non-emergent condition) []  - 0 Routine Hospital Admission (non-emergent condition) X- 1 15 New Admissions / Biomedical engineer / Ordering NPWT, Apligraf,  etc. []  - 0 Emergency Hospital Admission (emergent condition) PROCESS - Special Needs []  - Pediatric / Minor Patient Management 0 []  - 0 Isolation Patient Management []  - 0 Hearing / Language / Visual special needs []  - 0 Assessment of Community assistance (transportation, D/C planning, etc.) []  - 0 Additional assistance / Altered mentation []  - 0 Support  Surface(s) Assessment (bed, cushion, seat, etc.) DILLON, LIVERMORE ([id number]) INTERVENTIONS - Miscellaneous []  - External ear exam 0 []  - 0 Patient Transfer (multiple staff / Harrel Lemon Lift / Similar devices) []  - 0 Simple Staple / Suture removal (25 or less) []  - 0 Complex Staple / Suture removal (26 or more) []  - 0 Hypo/Hyperglycemic Management (do not check if billed separately) []  - 0 Ankle / Brachial Index (ABI) - do not check if billed separately Has the patient been seen at the hospital within the last three years: Yes Total Score: 85 Level Of Care: New/Established - Level 3 Electronic Signature(s) Signed: 05/16/2018 5:18:29 PM By: [name] Entered By: [name] on 05/16/2018 10:42:45 Andrew Lyons ([id number]) -------------------------------------------------------------------------------- Encounter Discharge Information Details Patient Name: Andrew Lyons Date of Service: 05/16/2018 9:45 AM Medical Record Number: [id number] Patient Account Number: [id number] Date of Birth/Sex: 04-Nov-1945 (72 y.o. M) Treating RN: Roger Shelter Primary Care Chrishawn Kring: [name], Delfino Lovett Other Clinician: Referring Rewa Weissberg: [name], [name] Treating Wynter Grave/Extender: [name] in Treatment: 0 Encounter Discharge Information Items Discharge Condition: Stable Ambulatory Status: Ambulatory Discharge Destination: Home Transportation: Private Auto Schedule Follow-up Appointment: Yes Clinical Summary of Care: Electronic Signature(s) Signed: 05/16/2018 12:51:13 PM By: Roger Shelter Entered By: Roger Shelter on 05/16/2018 10:35:31 Andrew Lyons ([id number]) -------------------------------------------------------------------------------- Lower Extremity Assessment Details Patient Name: Andrew Lyons Date of Service: 05/16/2018 9:45 AM Medical Record Number: [id number] Patient Account Number: [id number] Date of Birth/Sex: [birth year] (72 y.o. M) Treating RN: [name] Primary Care Anivea Velasques: [name] Other Clinician: Referring Lianne Carreto: [name], [name] Treating Vedanth Sirico/Extender: [name] in Treatment: 0 Electronic Signature(s) Signed: 05/16/2018 4:43:18 PM By: Alric Quan Entered By: Alric Quan on 05/16/2018 10:01:49 Andrew Lyons ([id number]) -------------------------------------------------------------------------------- Multi Wound Chart Details Patient Name: Andrew Lyons Date of Service: 05/16/2018 9:45 AM Medical Record Number: [id number] Patient Account Number: [id number] Date of Birth/Sex: Apr 22, 1946 (72 y.o. M) Treating RN: Cornell Barman Primary Care Nailyn Dearinger: [name], Delfino Lovett Other Clinician: Referring Jordin Dambrosio: [name], [name] Treating Manraj Yeo/Extender: [name] in Treatment: 0 Vital Signs Height(in): 70 Pulse(bpm): 33 Weight(lbs): 264.5 Blood Pressure(mmHg): 149/63 Body Mass Index(BMI): 38 Temperature(F): 97.6 Respiratory Rate 18 (breaths/min): Photos: [2:No Photos] [3:No Photos] [N/A:N/A] Wound Location: [2:Right Amputation Site - Below Knee] [3:Right Amputation Site - Below Knee] [N/A:N/A] Wounding Event: [2:Gradually Appeared] [3:Pressure Injury] [N/A:N/A] 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] [N/A:N/A] Secondary Etiology: [2:N/A] [3:Diabetic Wound/Ulcer of the N/A Lower Extremity] Comorbid History: [2:N/A] [3:Cataracts, Chronic Obstructive N/A Pulmonary Disease (COPD), Sleep Apnea, Hypotension,  Myocardial Infarction, Type II Diabetes, Osteoarthritis, Neuropathy] Date Acquired: [2:09/24/2017] [3:02/24/2018] [N/A:N/A] Weeks of Treatment: [2:29] [3:0] [N/A:N/A] Wound Status: [2:Healed - Epithelialized] [3:Open] [N/A:N/A] Measurements L x W x D [2:0x0x0] [3:0.8x0.8x0.6] [N/A:N/A] (cm) Area (cm) : [2:0] [3:0.503] [N/A:N/A] Volume (cm) : [2:0] [3:0.302] [N/A:N/A] Starting Position 1 [3:7] (o'clock): Ending Position 1 [3:1] (o'clock): Maximum Distance 1 (cm): [3:0.8] Undermining: [2:N/A] [3:Yes] [N/A:N/A] Classification: [2:N/A] [3:Category/Stage III] [N/A:N/A] Exudate Amount: [2:N/A] [3:Large] [N/A:N/A] Exudate Type: [2:N/A] [3:Serous] [N/A:N/A] Exudate Color: [2:N/A] [3:amber] [N/A:N/A] Wound Margin: [2:N/A] [3:Distinct, outline attached] [N/A:N/A] Granulation Amount: [2:N/A] [3:None Present (0%)] [N/A:N/A] Necrotic Amount: [2:N/A] [3:Large (67-100%)] [N/A:N/A] Necrotic Tissue: [2:N/A] [3:Eschar, Adherent Slough] [N/A:N/A] Epithelialization: [2:N/A] [3:None] [N/A:N/A] Debridement: [2:N/A] [3:Debridement - Excisional] [N/A:N/A] Pre-procedure N/A 10:24 N/A Verification/Time Out Taken: Pain Control: N/A Lidocaine 4% Topical Solution N/A Tissue Debrided: N/A Callus, Subcutaneous N/A Level: N/A Skin/Subcutaneous Tissue N/A Debridement Area (sq cm): N/A 0.64 N/A  Instrument: N/A Blade, Curette, Forceps N/A Bleeding: N/A Minimum N/A Hemostasis Achieved: N/A Silver Nitrate N/A Procedural Pain: N/A 0 N/A Post Procedural Pain: N/A 0 N/A Debridement Treatment N/A Procedure was tolerated well N/A Response: Post Debridement N/A 1x1x0.8 N/A Measurements L x W x D (cm) Post Debridement Volume: N/A 0.628 N/A (cm) Post Debridement Stage: N/A Category/Stage III N/A Periwound Skin Texture: No Abnormalities Noted Callus: Yes N/A Periwound Skin Moisture: No Abnormalities Noted No Abnormalities Noted N/A Periwound Skin Color: No Abnormalities Noted No Abnormalities Noted  N/A Temperature: N/A No Abnormality N/A Tenderness on Palpation: No Yes N/A Wound Preparation: N/A Ulcer Cleansing: N/A Rinsed/Irrigated with Saline Topical Anesthetic Applied: Other: lidocaine 4% Procedures Performed: N/A Debridement N/A Treatment Notes Wound #3 (Right Amputation Site - Below Knee) 1. Cleansed with: Clean wound with Normal Saline 2. Anesthetic Topical Lidocaine 4% cream to wound bed prior to debridement 5. Secondary Dressing Applied Bordered Foam Dressing Notes silvercel packed into wound and cover wound Electronic Signature(s) Signed: 05/16/2018 5:35:06 PM By: Linton Ham MD Entered By: Linton Ham on 05/16/2018 10:38:58 Andrew Lyons ([id number]) -------------------------------------------------------------------------------- [place] Details Patient Name: Andrew Lyons Date of Service: 05/16/2018 9:45 AM Medical Record Number: [id number] Patient Account Number: [id number] Date of Birth/Sex: 01-Apr-1946 (72 y.o. M) Treating RN: [name] Primary Care Ednah Hammock: [name], [name] Other Clinician: Referring Pete Schnitzer: [name] Treating Ole Lafon/Extender: [name] in Treatment: 0 Active Inactive ` Abuse / Safety / Falls / Self Care Management Nursing Diagnoses: Impaired physical mobility Goals: Patient will not develop complications from immobility Date Initiated: 05/16/2018 Target Resolution Date: 07/27/2018 Goal Status: Active Interventions: Assess fall risk on admission and as needed Notes: ` Orientation to the Wound Care Program Nursing Diagnoses: Knowledge deficit related to the wound healing center program Goals: Patient/caregiver will verbalize understanding of the [facility] Date Initiated: 05/16/2018 Target Resolution Date: 07/27/2018 Goal Status: Active Interventions: Provide education on orientation to the wound center Notes: ` Wound/Skin  Impairment Nursing Diagnoses: Impaired tissue integrity Goals: Ulcer/skin breakdown will heal within 14 weeks Date Initiated: 05/16/2018 Target Resolution Date: 07/27/2018 Goal Status: Active Interventions: KRISHANG, READING ([id number]) Assess patient/caregiver ability to obtain necessary supplies Assess patient/caregiver ability to perform ulcer/skin care regimen upon admission and as needed Assess ulceration(s) every visit Notes: Electronic Signature(s) Signed: 05/16/2018 10:53:39 AM By: [name] Entered By: [name] on 05/16/2018 10:53:38 Andrew Lyons ([id number]) -------------------------------------------------------------------------------- Pain Assessment Details Patient Name: Andrew Lyons Date of Service: 05/16/2018 9:45 AM Medical Record Number: [id number] Patient Account Number: [id number] Date of Birth/Sex: Dec 15, 1945 (72 y.o. M) Treating RN: [name] Primary Care Ashtan Laton: [name] Other Clinician: Referring Tauno Falotico: [name] Treating Akia Montalban/Extender: [name] in Treatment: 0 Active Problems Location of Pain Severity and Description of Pain Patient Has Paino No Site Locations Pain Management and Medication Current Pain Management: Electronic Signature(s) Signed: 05/16/2018 4:43:18 PM By: Alric Quan Entered By: Alric Quan on 05/16/2018 09:57:31 Andrew Lyons ([id number]) -------------------------------------------------------------------------------- Patient/Caregiver Education Details Patient Name: Andrew Lyons Date of Service: 05/16/2018 9:45 AM Medical Record Number: [id number] Patient Account Number: [id number] Date of Birth/Gender: August 05, 1946 (72 y.o. M) Treating RN: Roger Shelter Primary Care Physician: [name], Delfino Lovett Other Clinician: Referring Physician: [name] Treating Physician/Extender: [name] in Treatment: 0 Education  Assessment Education Provided To: Patient Education Topics Provided Welcome To [facility]: Handouts: Welcome To [facility] Methods: Explain/Verbal Responses: State content  correctly Wound Debridement: Handouts: Wound Debridement Methods: Explain/Verbal Responses: State content correctly Wound/Skin Impairment: Handouts: Caring for Your Ulcer Methods: Explain/Verbal Responses: State content correctly Electronic Signature(s) Signed: 05/16/2018 12:51:13 PM By: Roger Shelter Entered By: Roger Shelter on 05/16/2018 10:35:56 Andrew Lyons ([id number]) -------------------------------------------------------------------------------- Wound Assessment Details Patient Name: Andrew Lyons Date of Service: 05/16/2018 9:45 AM Medical Record Number: [id number] Patient Account Number: [id number] Date of Birth/Sex: 1946-01-26 (72 y.o. 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 Wound Status: Healed - Epithelialized Wounding Event: Gradually Appeared Date Acquired: 09/24/2017 Weeks Of Treatment: 29 Clustered Wound: No Wound Measurements Length: (cm) Width: (cm) Depth: (cm) Area: (cm) Volume: (cm) 0 % Reduction in Area: 0 % Reduction in Volume: 0 0 0 Periwound Skin Texture Texture Color No Abnormalities Noted: No No Abnormalities Noted: No Moisture No Abnormalities Noted: No Electronic Signature(s) Signed: 05/16/2018 4:43:18 PM By: Alric Quan Entered By: Alric Quan on 05/16/2018 10:07:14 Andrew Lyons ([id number]) -------------------------------------------------------------------------------- Wound Assessment Details Patient  Name: Andrew Lyons Date of Service: 05/16/2018 9:45 AM Medical Record Number: [id number] Patient Account Number: [id number] Date of Birth/Sex: 1946/07/27 (72 y.o. 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 Wounding Event: Pressure Injury Etiology: Date Acquired: 02/24/2018 Wound Open Weeks Of Treatment: 0 Status: Clustered Wound: No Comorbid Cataracts, Chronic Obstructive Pulmonary History: Disease (COPD), Sleep Apnea, Hypotension, Myocardial Infarction, Type II Diabetes, Osteoarthritis, Neuropathy Photos Photo Uploaded By: Alric Quan on 05/16/2018 16:24:33 Wound Measurements Length: (cm) 0.8 Width: (cm) 0.8 Depth: (cm) 0.6 Area: (cm) 0.503 Volume: (cm) 0.302 % Reduction in Area: % Reduction in Volume: Epithelialization: None Tunneling: No Undermining: Yes Starting Position (o'clock): 7 Ending Position (o'clock): 1 Maximum Distance: (cm) 0.8 Wound Description Classification: Category/Stage III Wound Margin: Distinct, outline attached Exudate Amount: Large Exudate Type: Serous Exudate Color: amber Foul Odor After Cleansing: No Slough/Fibrino Yes Wound Bed Granulation Amount: None Present (0%) Exposed Structure Necrotic Amount: Large (67-100%) Fat Layer (Subcutaneous Tissue) Exposed: Yes FREDRICO, BEEDLE ([id number]) Necrotic Quality: Eschar, Adherent Slough Periwound Skin Texture Texture Color No Abnormalities Noted: No No Abnormalities Noted: No Callus: Yes Temperature / Pain Moisture Temperature: No Abnormality No Abnormalities Noted: No Tenderness on Palpation: Yes Wound Preparation Ulcer Cleansing: Rinsed/Irrigated  with Saline Topical Anesthetic Applied: Other: lidocaine 4%, Treatment Notes Wound #3 (Right Amputation Site - Below Knee) 1. Cleansed with: Clean wound with Normal Saline 2. Anesthetic Topical Lidocaine 4% cream to wound bed prior to debridement 5. Secondary Dressing Applied Bordered Foam Dressing Notes silvercel packed into wound and cover wound Electronic Signature(s) Signed: 05/16/2018 4:43:18 PM By: Alric Quan Entered By: Alric Quan on 05/16/2018 10:11:09 Andrew Lyons ([id number]) -------------------------------------------------------------------------------- Center Details Patient Name: Andrew Lyons Date of Service: 05/16/2018 9:45 AM Medical Record Number: [id number] Patient Account Number: [id number] Date of Birth/Sex: 1945/10/07 (71 y.o. M) Treating RN: [name] Primary Care Jabreel Chimento: [name] Other Clinician: Referring Shiree Altemus: [name], [name] Treating Cyniah Gossard/Extender: [name] in Treatment: 0 Vital Signs Time Taken: 09:57 Temperature (F): 97.6 Height (  in): 70 Pulse (bpm): 62 Source: Stated Respiratory Rate (breaths/min): 18 Weight (lbs): 264.5 Blood Pressure (mmHg): 149/63 Source: Measured Reference Range: 80 - 120 mg / dl Body Mass Index (BMI): 37.9 Electronic Signature(s) Signed: 05/16/2018 4:43:18 PM By: Alric Quan Entered By: Alric Quan on 05/16/2018 09:59:55

## 2018-05-25 NOTE — Progress Notes (Signed)
OBRIEN, HUSKINS ([id number]) Visit Report for 05/16/2018 Abuse/Suicide Risk Screen Details Patient Name: Andrew Lyons, Andrew Lyons Date of Service: 05/16/2018 9:45 AM Medical Record Number: [id number] Patient Account Number: [id number] Date of Birth/Sex: 05-Jan-1946 (72 y.o. M) Treating RN: [name] Primary Care Samah Lapiana: [name], Delfino Lovett Other Clinician: Referring Frannie Shedrick: [name] Treating Dazha Kempa/Extender: [name] in Treatment: 0 Abuse/Suicide Risk Screen Items Answer ABUSE/SUICIDE RISK SCREEN: Has anyone close to you tried to hurt or harm you recentlyo No Do you feel uncomfortable with anyone in your familyo No Has anyone forced you do things that you didnot want to doo No Do you have any thoughts of harming yourselfo No Patient displays signs or symptoms of abuse and/or neglect. No Electronic Signature(s) Signed: 05/16/2018 4:43:18 PM By: Alric Quan Entered By: Alric Quan on 05/16/2018 10:03:32 Andrew Lyons ([id number]) -------------------------------------------------------------------------------- Activities of Daily Living Details Patient Name: Andrew Lyons Date of Service: 05/16/2018 9:45 AM Medical Record Number: [id number] Patient Account Number: [id number] Date of Birth/Sex: August 19, 1946 (72 y.o. M) Treating RN: [name] Primary Care Jessika Rothery: [name] Other Clinician: Referring Halo Laski: [name] Treating Ewel Lona/Extender: [name] in Treatment: 0 Activities of Daily Living Items Answer Activities of Daily Living (Please select one for each item) Drive Automobile Completely Able Take Medications Completely Able Use Telephone Completely Able Care for Appearance Completely Able Use Toilet Completely Able Bath / Shower Completely Able Dress Self Completely Able Feed Self Completely Able Walk Completely Able Get In / Out Bed Completely Able Housework Completely  Able Prepare Meals Completely Able Handle Money Completely Able Shop for Self Completely Able Electronic Signature(s) Signed: 05/16/2018 4:43:18 PM By: Alric Quan Entered By: Alric Quan on 05/16/2018 10:03:49 Andrew Lyons ([id number]) -------------------------------------------------------------------------------- Education Assessment Details Patient Name: Andrew Lyons Date of Service: 05/16/2018 9:45 AM Medical Record Number: [id number] Patient Account Number: [id number] Date of Birth/Sex: 1946-01-09 (72 y.o. M) Treating RN: [name] Primary Care Oleva Koo: [name] Other Clinician: Referring Wilmer Berryhill: [name] Treating Chandell Attridge/Extender: [name] in Treatment: 0 Primary Learner Assessed: Patient Learning Preferences/Education Level/Primary Language Learning Preference: Explanation Highest Education Level: Grade School Preferred Language: English Cognitive Barrier Assessment/Beliefs Language Barrier: No Translator Needed: No Memory Deficit: No Emotional Barrier: No Cultural/Religious Beliefs Affecting Medical Care: No Physical Barrier Assessment Impaired Vision: Yes Glasses Impaired Hearing: No Decreased Hand dexterity: No Knowledge/Comprehension Assessment Knowledge Level: Medium Comprehension Level: Medium Ability to understand written Medium instructions: Ability to understand verbal Medium instructions: Motivation Assessment Anxiety Level: Calm Cooperation: Cooperative Education Importance: Acknowledges Need Interest in Health Problems: Asks Questions Perception: Coherent Willingness to Engage in Self- Medium Management Activities: Readiness to Engage in Self- Medium Management Activities: Electronic Signature(s) Signed: 05/16/2018 4:43:18 PM By: Alric Quan Entered By: Alric Quan on 05/16/2018 10:04:14 Andrew Lyons  ([id number]) -------------------------------------------------------------------------------- Fall Risk Assessment Details Patient Name: Andrew Lyons Date of Service: 05/16/2018 9:45 AM Medical Record Number: [id number] Patient Account Number: [id number] Date of Birth/Sex: November 04, 1945 (72 y.o. M) Treating RN: [name] Primary Care Denys Labree: [name], Delfino Lovett Other Clinician: Referring Ferrah Panagopoulos: [name], [name] Treating Faythe Heitzenrater/Extender: [name] in Treatment: 0 Fall Risk Assessment Items Have you had 2 or more falls in the last 12 monthso 0 No Have you had any fall that resulted in injury in the last 12 monthso 0 No FALL RISK ASSESSMENT: History of falling - immediate or within 3 months 0 No Secondary diagnosis 0 No Ambulatory aid None/bed  rest/wheelchair/nurse 0 No Crutches/cane/walker 0 No Furniture 0 No IV Access/Saline Lock 0 No Gait/Training Normal/bed rest/immobile 0 No Weak 0 No Impaired 20 Yes Mental Status Oriented to own ability 0 Yes Electronic Signature(s) Signed: 05/16/2018 4:43:18 PM By: Alric Quan Entered By: Alric Quan on 05/16/2018 10:04:25 Andrew Lyons ([id number]) -------------------------------------------------------------------------------- Foot Assessment Details Patient Name: Andrew Lyons Date of Service: 05/16/2018 9:45 AM Medical Record Number: [id number] Patient Account Number: [id number] Date of Birth/Sex: April 10, 1946 (72 y.o. 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 Site Locations + = Sensation present, - = Sensation absent, C = Callus, U = Ulcer R = Redness, W = Warmth, M = Maceration, PU = Pre-ulcerative lesion F = Fissure, S = Swelling, D =  Dryness Assessment Right: Left: Other Deformity: No Prior Foot Ulcer: No Prior Amputation: No Charcot Joint: No Ambulatory Status: Gait: Electronic Signature(s) Signed: 05/16/2018 4:43:18 PM By: Alric Quan Entered By: Alric Quan on 05/16/2018 10:04:53 Andrew Lyons ([id number]) -------------------------------------------------------------------------------- Nutrition Risk Assessment Details Patient Name: Andrew Lyons Date of Service: 05/16/2018 9:45 AM Medical Record Number: [id number] Patient Account Number: [id number] Date of Birth/Sex: 11/21/1945 (72 y.o. M) Treating RN: [name] Primary Care Damica Gravlin: [name], Delfino Lovett Other Clinician: Referring Gia Lusher: [name], [name] Treating Dereon Corkery/Extender: [name] in Treatment: 0 Height (in): 70 Weight (lbs): 264.5 Body Mass Index (BMI): 37.9 Nutrition Risk Assessment Items NUTRITION RISK SCREEN: I have an illness or condition that made me change the kind and/or amount of 0 No food I eat I eat fewer than two meals per day 0 No I eat few fruits and vegetables, or milk products 0 No I have three or more drinks of beer, liquor or wine almost every day 0 No I have tooth or mouth problems that make it hard for me to eat 0 No I don't always have enough money to buy the food I need 0 No I eat alone most of the time 0 No I take three or more different prescribed or over-the-counter drugs a day 0 No Without wanting to, I have lost or gained 10 pounds in the last six months 0 No I am not always physically able to shop, cook and/or feed myself 0 No Nutrition Protocols Good Risk Protocol Moderate Risk Protocol Electronic Signature(s) Signed: 05/16/2018 4:43:18 PM By: Alric Quan Entered By: Alric Quan on 05/16/2018 10:04:38

## 2018-05-26 NOTE — Progress Notes (Signed)
Andrew, Lyons ([id number]) Visit Report for 05/16/2018 Debridement Details Patient Name: Andrew Lyons, Andrew Lyons Date of Service: 05/16/2018 9:45 AM Medical Record Number: [id number] Patient Account Number: [id number] Date of Birth/Sex: 1945-11-26 (72 y.o. M) Treating RN: Cornell Barman Primary Care Provider: [name], Delfino Lovett Other Clinician: Referring Provider: [name], [name] Treating Provider/Extender: [name] in Treatment: 0 Debridement Performed for Wound #3 Right Amputation Site - Below Knee Assessment: Performed By: Physician [name], MD Debridement Type: Debridement Severity of Tissue Pre Bone involvement without necrosis Debridement: Pre-procedure Verification/Time Yes - 10:24 Out Taken: Start Time: 10:24 Pain Control: Lidocaine 4% Topical Solution Total Area Debrided (L x W): 0.8 (cm) x 0.8 (cm) = 0.64 (cm) Tissue and other material Callus, Subcutaneous debrided: Level: Skin/Subcutaneous Tissue Debridement Description: Excisional Instrument: Blade, Curette, Forceps Bleeding: Minimum Hemostasis Achieved: Silver Nitrate End Time: 10:27 Procedural Pain: 0 Post Procedural Pain: 0 Response to Treatment: Procedure was tolerated well Level of Consciousness: Awake and Alert Post Debridement Measurements of Total Wound Length: (cm) 1 Stage: Category/Stage III Width: (cm) 1 Depth: (cm) 0.8 Volume: (cm) 0.628 Character of Wound/Ulcer Post Improved Debridement: Bone involvement without Severity of Tissue Post Debridement: necrosis Post Procedure Diagnosis Same as Pre-procedure Electronic Signature(s) Signed: 05/16/2018 5:35:06 PM By: Linton Ham MD Signed: 05/17/2018 4:35:08 PM By: Gretta Cool, BSN, RN, CWS, Kim RN, BSN Entered By: Linton Ham on 05/16/2018 10:39:08 Andrew Lyons ([id number], SALING ([id number]) -------------------------------------------------------------------------------- HPI  Details Patient Name: Andrew Lyons Date of Service: 05/16/2018 9:45 AM Medical Record Number: [id number] Patient Account Number: [id number] Date of Birth/Sex: 05-31-1946 (71 y.o. M) Treating RN: Cornell Barman Primary Care Provider: [name], Delfino Lovett Other Clinician: Referring Provider: [name], [name] Treating Provider/Extender: [name] in Treatment: 0 History of Present Illness HPI Description: 01/04/17; this is a 71 year old diabetic man who has a remote history of a traumatic lower extremity damage at age 35 requiring an amputation. He tells me spent 2 years walking on crutches then ultimately has been walking on prosthesis without any trouble since then. Several months ago he had a new prosthesis and developed an open area in the right stump in December. He saw podiatry Dr. Prudence Davidson who noted that this was really out of his practice jurisdiction and referred him here. He has been using mupirocin. He has continued to walk on the prosthesis. The prosthesis itself as been adjusted by the prostatitis. He does not have a known arterial issue. He tells me he probably has diabetic neuropathy. He had a wound that took a long time to heal surrounding the actual amputation itself however is not had more recent wounds. 01/10/17 Culture from last week grew pseudomonas. change AB to cefdinir yesterday from doxy. he is not using his prosthesis which I emphasized although he is having it adjusted next week 01/17/17; he is completing his antibiotics in the next day or 2. We've been using silver alginate. His prosthesis is away being readjusted. He had an x-ray of the underlying bone here apparently at his podiatrist's office although I'll need to have a look at that and we can't find the result then he may need this re-x-rayed. 01/24/17; he has completed his antibiotics. Change to Silver collagen last week. No real change in the wound this week. 01/31/17 repeat culture I did last week  showed a few Pseudomonas. This only has intermediate sensitivity to quinolones I therefore put him back on a week of Cefdinir. X-ray of the stump did not show  any bone destruction. We are attempting to get vascular studies done through Dr.Arida's office on May 23. 02/07/17; arterial studies are booked for May 23 no major change. He has completed his antibiotics 02/14/17 arterial studies on Thursday 5/23. using collagen 02/21/17; the patient had his arterial studies. Both his ABIs were good at 1.0 on the right and 1.1 on the left. His abdominal and iliac arteries revealed atherosclerosis without focal stenosis duplex imaging of the right lower leg revealed heterogeneous plaque and biphasic Doppler waveforms from the right CFA to the popliteal artery. The right distal SFA and tibial peroneal trunk velocities were elevated. 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 The proximal right ATA and PTA were patent above the amputation level. We have been using Silver collagen with some improvement in the wound dimensions 02/28/17; we went ahead and did TCOM measurements on this patient. One of the leads was nonfunctional however on the right leg both the proximal and periwound leads registered greater than 70 mmHg oxygen tension. Interestingly the comparison lead on the left leg/relative wound level was in the mid 40s. This allays any fears I had about contributing wound ischemia. In the meantime is measurements today were 0.2 x 0.4 x 0.7 the wound is now a small divot most of it appears to be epithelialized with only a small open area at that depth. There is no evidence of surrounding infection 03/07/17; continued improvement down to 0.3 cm in depth. This is a very tiny circular opening. No drainage no tenderness. Looks like it is been progressing towards closure. He has been using Silver collagen although I think this is too small to use this as a primary dressing. 03/14/17; still 0.3 cm in  depth tiny circular wound. No drainage or tenderness. Used Iodosorb ointment starting last week 03/21/17; still the depth and this wound. Using a #3 curet I remove necrotic material from the wound base. There is a divot here. Not making enough improvement on the Iodosorb, I change back to silver collagen this week. 03/28/17; depth down to 0.2. I cannot actually see a surface of this wound. We've been using silver collagen which I will continue 04/04/17 on evaluation today patient appears to be doing very well. In fact his wound appears to be completely healed though there is a slight eschar over the area I see no fluctuance underneath and I am reluctant to attempt scraping on the area due to the very center superficial nature of the eschar. Patient has no discomfort. 04/11/17; the patient went back to [facility] to his prosthetist however his leg was too swollen on the right deficient into the prosthesis. The wound itself on the tip of his stump on the right is closed and was close last week. I see no reason to be concerned about this. AVORY, MIMBS ([id number]) READMISSION 10/25/17; this is a patient that we had in clinic from April to July 2018 with a probing tiny open area on his right below sided below-knee amputation site. X-ray of this area did not show osteomyelitis. I had some concern about this being ischemic for a while although we did ABIs and T Coms that did a lot to allay these concerns. The patient is a type II diabetic although I don't have a recent hemoglobin A1c. He tells me that after he left the clinic last time he got a new prosthesis, not the prosthesis that originally caused wounds in this area. He states about a month ago used noted some dark  discoloration in his skin or at least his wife did when he was getting out of the shower. He since has noted increasing pain and tenderness when he walks around this area. He arrives in clinic with no open wound 11/01/17; small  probing wound on the medial aspect of the stump from a distal amputation site remotely. Surprisingly culture I did last week showed gram-positive cocci but did not culture. X-ray showed edema but no bony abnormality no soft tissue gas etc. we've been using silver alginate. He is offloading this using a scooter 11/08/17; small probing wound on the medial aspect of his stump from a distal amputation done remotely. He arrived in here with a small probing hold the did not go to bone. Culture I did of the discharged from here was negative. Nevertheless I gave him a week's worth of Augmentin wondering whether this could've been anaerobic. He is not having any pain versus when he came in here. He does have callus on the medial aspect of the stump and in my mind this represents excess pressure on this area when he walks. The area has completely closed once again READMISSION 05/16/18 This is the third admission to the clinic for this patient who had a very distal below-knee amputation many years ago. She uses a modified prosthesis. We have had problems with recurrent wounds in the medial part of the stump which is a small area originally his distal tibia. We've been able to get this to close on 2 occasions. He tells me that this reopened about 2 months ago. Since then he's been "doctoring on it" himself I think packing it with collagen and covering it with alginate he had left over from previous visits. Previous plain x-rays of this area have not shown soft tissue or bone issues on the stump.he tells me he went to see vein and vascular in [place] in follow-up of this area although I don't have this in my notes I'll need to research this in Epic. By his description he says that the blood flow on the right was better than the left. He is a poorly controlled diabetic with a recent hemoglobin A1c of 9 last month he has not yet on insulin. On 02/17/17 he had arterial studies. These were generally quite good with  an ABI on the right of 1 on the left of 1.1. Duplex imaging with color Doppler of the right lower leg showed heterogeneous plaque and biphasic Doppler waveforms from the right SFA to the popliteal artery. Right distal SFA and tibial peroneal trunk velocities were elevated. He was felt to have a 30- 49% distal SFA stenosis however the right ATA and PTA were felt to be patent above the amputation level. I do not have that he actually saw a vascular surgeon I'll need to research this in Epic E May have actually been referring to the test just quoted Electronic Signature(s) Signed: 05/16/2018 5:35:06 PM By: Linton Ham MD Entered By: Linton Ham on 05/16/2018 10:56:45 Andrew Lyons ([id number]) -------------------------------------------------------------------------------- Physical Exam Details Patient Name: Andrew Lyons Date of Service: 05/16/2018 9:45 AM Medical Record Number: [id number] Patient Account Number: [id number] Date of Birth/Sex: 08-19-1946 (72 y.o. M) Treating RN: Cornell Barman Primary Care Provider: [name], Delfino Lovett Other Clinician: Referring Provider: [name], [name] Treating Provider/Extender: [name] in Treatment: 0 Constitutional Patient is hypertensive.. Pulse regular and within target range for patient.Marland Kitchen Respirations regular, non-labored and within target range.. Temperature is normal and within the target range for  the patient.Marland Kitchen appears in no distress. Respiratory Respiratory effort is easy and symmetric bilaterally. Rate is normal at rest and on room air.. Cardiovascular I do not feel his right popliteal or right femoral pulse. the stump site on the right is warm there is no overt infection. Lymphatic none palpable in the right popliteal or inguinal area. Integumentary (Hair, Skin) no primary skin issues are seen. Notes wound exam; once again the patient has a small open wound on the medial aspect of his stump although this is  larger than some of the previous wounds he's had. There is thick surrounding skin and subcutaneous tissue and a probing tunnel that I think goes down to bone. The patient states he has had some drainage although I didn't see any present. There was a slight odor. Using a #10 scalpel and pickups I remove skin and subcutaneous tissue from around the circumference of the wound and then a #5 curet removing sub-cutaneous debris from around the wound circumference. Hemostasis with silver nitrate and direct pressure Electronic Signature(s) Signed: 05/16/2018 5:35:06 PM By: Linton Ham MD Entered By: Linton Ham on 05/16/2018 10:52:21 Andrew Lyons ([id number]) -------------------------------------------------------------------------------- Physician Orders Details Patient Name: Andrew Lyons Date of Service: 05/16/2018 9:45 AM Medical Record Number: [id number] Patient Account Number: [id number] Date of Birth/Sex: 24-Nov-1945 (72 y.o. M) Treating RN: [name] Primary Care Provider: [name], Delfino Lovett Other Clinician: Referring Provider: [name] Treating Provider/Extender: [name] in Treatment: 0 Verbal / Phone Orders: No Diagnosis Coding Wound Cleansing Wound #3 Right Amputation Site - Below Knee o Clean wound with Normal Saline. o May Shower, gently pat wound dry prior to applying new dressing. Anesthetic (add to Medication List) Wound #3 Right Amputation Site - Below Knee o Topical Lidocaine 4% cream applied to wound bed prior to debridement (In Clinic Only). Primary Wound Dressing Wound #3 Right Amputation Site - Below Knee o Silver Alginate Secondary Dressing Wound #3 Right Amputation Site - Below Knee o Boardered Foam Dressing Dressing Change Frequency Wound #3 Right Amputation Site - Below Knee o Change dressing every other day. Follow-up Appointments Wound #3 Right Amputation Site - Below Knee o Return Appointment  in 1 week. Off-Loading Wound #3 Right Amputation Site - Below Knee o Other: - do not wear your prosthesis Laboratory o Bacteria identified in Wound by Culture (MICRO) oooo LOINC Code: 6160-7 oooo Convenience Name: Wound culture routine Radiology o X-ray, lower leg BRAIDON, CHERMAK ([id number]) Electronic Signature(s) Signed: 05/16/2018 5:18:29 PM By: [name] Signed: 05/16/2018 5:35:06 PM By: Linton Ham MD Entered By: [name] on 05/16/2018 10:31:38 Andrew Lyons ([id number]) -------------------------------------------------------------------------------- Problem List Details Patient Name: Andrew Lyons Date of Service: 05/16/2018 9:45 AM Medical Record Number: [id number] Patient Account Number: [id number] Date of Birth/Sex: [birth year] (72 y.o. 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 M) Treating RN: Cornell Barman Primary Care Provider: [name],  Delfino Lovett Other Clinician: Referring Provider: [name], [name] Treating Provider/Extender: [name] in Treatment: 0 Subjective History of Present Illness (HPI) 01/04/17; this is a 72 year old diabetic man who has a remote history of a traumatic lower extremity damage at age 66 requiring an amputation. He tells me spent 2 years walking on crutches then ultimately has been walking on prosthesis without any trouble since then. Several months ago he had a new prosthesis and developed an open area in the right stump in December. He saw podiatry Dr. Prudence Davidson who noted that this was really out of his practice jurisdiction and referred him here. He has been using mupirocin. He has continued to walk on the prosthesis. The prosthesis itself as been adjusted by the prostatitis. He does not have a known arterial issue. He tells me he probably has diabetic neuropathy. He had a wound that took a long time to heal surrounding the actual amputation itself however is not had more recent wounds. 01/10/17 Culture from last week grew pseudomonas. change AB to cefdinir yesterday from doxy. he is not using his prosthesis which I emphasized although he is having it adjusted next week 01/17/17; he is completing his antibiotics in the next day or 2. We've been using silver alginate. His prosthesis is away being readjusted. He had an x-ray of the underlying bone here apparently at his podiatrist's office although I'll need to have a look at that and we can't find the result then he may need this re-x-rayed. 01/24/17; he has completed his antibiotics. Change to Silver collagen last week. No real change in the wound this week. 01/31/17 repeat culture I did last week showed a few Pseudomonas. This only has intermediate sensitivity to quinolones I therefore put him back on a week of Cefdinir. X-ray of the stump did not show any bone destruction. We are attempting to get vascular studies done through Dr.Arida's office  on May 23. 02/07/17; arterial studies are booked for May 23 no major change. He has completed his antibiotics 02/14/17 arterial studies on Thursday 5/23. using collagen 02/21/17; the patient had his arterial studies. Both his ABIs were good at 1.0 on the right and 1.1 on the left. His abdominal and iliac arteries revealed atherosclerosis without focal stenosis duplex imaging of the right lower leg revealed heterogeneous plaque and biphasic Doppler waveforms from the right CFA to the popliteal artery. The right distal SFA and tibial peroneal trunk velocities were elevated. 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 The proximal right ATA and PTA were patent above the amputation level. We have been using Silver collagen with some improvement in the wound dimensions 02/28/17; we went ahead and did TCOM measurements on this patient. One of the leads was nonfunctional however on the right leg both the proximal and periwound leads registered greater than 70 mmHg oxygen tension. Interestingly the comparison lead on the left leg/relative wound level was in the mid 40s. This allays any fears I had about contributing wound ischemia. In the meantime is measurements today were 0.2 x 0.4 x 0.7 the wound is now a small divot most of it appears to be epithelialized with only a small open area at that depth. There is no evidence of surrounding infection 03/07/17; continued improvement down to 0.3 cm in depth. This is a very tiny circular opening. No drainage  no tenderness. Looks like it is been progressing towards closure. He has been using Silver collagen although I think this is too small to use this as a primary dressing. 03/14/17; still 0.3 cm in depth tiny circular wound. No drainage or tenderness. Used Iodosorb ointment starting last week 03/21/17; still the depth and this wound. Using a #3 curet I remove necrotic material from the wound base. There is a divot here. Not making enough improvement on  the Iodosorb, I change back to silver collagen this week. 03/28/17; depth down to 0.2. I cannot actually see a surface of this wound. We've been using silver collagen which I will continue 04/04/17 on evaluation today patient appears to be doing very well. In fact his wound appears to be completely healed though there is a slight eschar over the area I see no fluctuance underneath and I am reluctant to attempt scraping on the area due to the very center superficial nature of the eschar. Patient has no discomfort. 04/11/17; the patient went back to [facility] to his prosthetist however his leg was too swollen on the right deficient into the prosthesis. The wound itself on the tip of his stump on the right is closed and was close last week. I see no reason to be EBONY, RICKEL T. ([id number]) concerned about this. READMISSION 10/25/17; this is a patient that we had in clinic from April to July 2018 with a probing tiny open area on his right below sided below-knee amputation site. X-ray of this area did not show osteomyelitis. I had some concern about this being ischemic for a while although we did ABIs and T Coms that did a lot to allay these concerns. The patient is a type II diabetic although I don't have a recent hemoglobin A1c. He tells me that after he left the clinic last time he got a new prosthesis, not the prosthesis that originally caused wounds in this area. He states about a month ago used noted some dark discoloration in his skin or at least his wife did when he was getting out of the shower. He since has noted increasing pain and tenderness when he walks around this area. He arrives in clinic with no open wound 11/01/17; small probing wound on the medial aspect of the stump from a distal amputation site remotely. Surprisingly culture I did last week showed gram-positive cocci but did not culture. X-ray showed edema but no bony abnormality no soft tissue gas etc. we've been using silver  alginate. He is offloading this using a scooter 11/08/17; small probing wound on the medial aspect of his stump from a distal amputation done remotely. He arrived in here with a small probing hold the did not go to bone. Culture I did of the discharged from here was negative. Nevertheless I gave him a week's worth of Augmentin wondering whether this could've been anaerobic. He is not having any pain versus when he came in here. He does have callus on the medial aspect of the stump and in my mind this represents excess pressure on this area when he walks. The area has completely closed once again READMISSION 05/16/18 This is the third admission to the clinic for this patient who had a very distal below-knee amputation many years ago. She uses a modified prosthesis. We have had problems with recurrent wounds in the medial part of the stump which is a small area originally his distal tibia. We've been able to get this to close on 2 occasions. He tells  me that this reopened about 2 months ago. Since then he's been "doctoring on it" himself I think packing it with collagen and covering it with alginate he had left over from previous visits. Previous plain x-rays of this area have not shown soft tissue or bone issues on the stump.he tells me he went to see vein and vascular in [place] in follow-up of this area although I don't have this in my notes I'll need to research this in Epic. By his description he says that the blood flow on the right was better than the left. He is a poorly controlled diabetic with a recent hemoglobin A1c of 9 last month he has not yet on insulin. On 02/17/17 he had arterial studies. These were generally quite good with an ABI on the right of 1 on the left of 1.1. Duplex imaging with color Doppler of the right lower leg showed heterogeneous plaque and biphasic Doppler waveforms from the right SFA to the popliteal artery. Right distal SFA and tibial peroneal trunk velocities were  elevated. He was felt to have a 30- 49% distal SFA stenosis however the right ATA and PTA were felt to be patent above the amputation level. I do not have that he actually saw a vascular surgeon I'll need to research this in Epic E May have actually been referring to the test just quoted Wound History Patient presents with 1 open wound that has been present for approximately 2 months. Patient has been treating wound in the following manner: prisma, silver alginate. Laboratory tests have not been performed in the last month. Patient reportedly has not tested positive for an antibiotic resistant organism. Patient reportedly has not tested positive for osteomyelitis. Patient reportedly has not had testing performed to evaluate circulation in the legs. Patient experiences the following problems associated with their wounds: swelling. Patient History Information obtained from Patient. Allergies Zyban, Zocor Family History Diabetes - Father, Heart Disease - Mother,Father, Hypertension - Father,Mother, No family history of Cancer, Hereditary Spherocytosis, Kidney Disease, Lung Disease, Seizures, Stroke, Thyroid Problems, Tuberculosis. Social History Former smoker - quit 15 yrs ago, Marital Status - Married, Alcohol Use - Rarely, Drug Use - No History, Caffeine Use - Daily. JERRITT, CARDOZA ([id number]) Review of Systems (ROS) Constitutional Symptoms (General Health) The patient has no complaints or symptoms. Objective Constitutional Patient is hypertensive.. Pulse regular and within target range for patient.Marland Kitchen Respirations regular, non-labored and within target range.. Temperature is normal and within the target range for the patient.Marland Kitchen appears in no distress. Vitals Time Taken: 9:57 AM, Height: 70 in, Source: Stated, Weight: 264.5 lbs, Source: Measured, BMI: 37.9, Temperature: 97.6 F, Pulse: 62 bpm, Respiratory Rate: 18 breaths/min, Blood Pressure: 149/63 mmHg. Respiratory Respiratory  effort is easy and symmetric bilaterally. Rate is normal at rest and on room air.. Cardiovascular I do not feel his right popliteal or right femoral pulse. the stump site on the right is warm there is no overt infection. Lymphatic none palpable in the right popliteal or inguinal area. General Notes: wound exam; once again the patient has a small open wound on the medial aspect of his stump although this is larger than some of the previous wounds he's had. There is thick surrounding skin and subcutaneous tissue and a probing tunnel that I think goes down to bone. The patient states he has had some drainage although I didn't see any present. There was a slight odor. Using a #10 scalpel and pickups I remove skin and subcutaneous tissue from around  the circumference of the wound and then a #5 curet removing sub-cutaneous debris from around the wound circumference. Hemostasis with silver nitrate and direct pressure Integumentary (Hair, Skin) no primary skin issues are seen. Wound #2 status is Healed - Epithelialized. Original cause of wound was Gradually Appeared. 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 The wound measures 0cm length x 0cm width x 0cm depth; 0cm^2 area and 0cm^3 volume. Wound #3 status is Open. Original cause of wound was Pressure Injury. 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 The wound measures 0.8cm length x 0.8cm width x 0.6cm depth; 0.503cm^2 area and 0.302cm^3 volume. There is Fat Layer (Subcutaneous Tissue) Exposed exposed. There is no tunneling noted, however, there is undermining starting at 7:00 and ending at 1:00 with a maximum distance of 0.8cm. There is a large amount of serous drainage noted. The wound margin is distinct with the outline attached to the wound base. There is no granulation within the wound bed. There is a large (67-100%) amount of necrotic tissue within the wound bed including Eschar and Adherent Slough. The  periwound skin appearance exhibited: Callus. Periwound temperature was noted as No Abnormality. The periwound has tenderness on palpation. 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 There was a Excisional Skin/Subcutaneous Tissue Debridement with a total area of 0.64 sq cm performed by [name], MD. With the following instrument(s): Blade, Curette, and Forceps Material removed includes Callus and Subcutaneous Tissue and after achieving pain control using Lidocaine 4% Topical Solution. No specimens were taken. A time out was conducted at 10:24, prior to the start of the procedure. A Minimum amount of bleeding was controlled with Silver Nitrate. The procedure was tolerated well with a pain level of 0 throughout and a pain level of 0 following the procedure. Patient s Level of Consciousness post procedure was recorded as Awake and Alert. Post Debridement Measurements: 1cm length x 1cm width x 0.8cm depth; 0.628cm^3 volume. Post debridement Stage noted as Category/Stage III. Character of Wound/Ulcer Post Debridement is improved. Severity of Tissue Post Debridement is: Bone involvement without necrosis. Post procedure Diagnosis Wound #3: Same as Pre-Procedure Plan Wound Cleansing: Wound #3 Right Amputation Site - Below Knee: Clean wound with Normal Saline. May Shower, gently pat wound dry prior to applying new dressing. Anesthetic (add to Medication List): Wound #3 Right Amputation Site - Below Knee: Topical Lidocaine 4% cream applied to wound bed  prior to debridement (In Clinic Only). 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 Follow-up Appointments: Wound #3 Right Amputation Site - Below Knee: Return Appointment in 1 week. Off-Loading: Wound #3 Right Amputation Site - Below Knee: MIKING, USREY ([id number]) Other: - do not wear your prosthesis Laboratory ordered were: Wound culture routine Radiology ordered were: X-ray, lower leg #1 patient with poorly controlled diabetes. #2 recurrent wound on the stump and the distal right tibial area. #  3 he states he saw vein and vascular with regards to his arterial flow in [place]. I'll need to look this up. Previously his ABI was 1 on the right and 1.1 on the left. He was felt to have a 30-49% right distal SFA stenosis. His proximal right ATA and PTA were patent above the amputation level. Doppler imaging of the right showed heterogeneous plaque but biphasic Doppler waveforms from the right CFA to the popliteal artery. #4 if he did not see vascular surgery I am going to ask for an appointment just to have them go over this. I think this is probably undue pressure in his prosthesis although I'll need to be sure about his vascular status. I did not make reference to him actually seeing a vascular surgeon #5 I did a culture of the deep probing area which unfortunately I think reaches bone. No empiric antibiotics #6 plain film of the right stump. At this point I can't rule out doing an MRI Electronic Signature(s) Signed: 05/16/2018 10:57:22 AM By: Linton Ham MD Entered By: Linton Ham on 05/16/2018 10:57:21 Andrew Lyons ([id number]) -------------------------------------------------------------------------------- ROS/PFSH  Details Patient Name: Andrew Lyons Date of Service: 05/16/2018 9:45 AM Medical Record Number: [id number] Patient Account Number: [id number] Date of Birth/Sex: [birth year] (72 y.o. M) Treating RN: [name] Primary Care Provider: [name], Delfino Lovett Other Clinician: Referring Provider: [name] Treating Provider/Extender: [name] in Treatment: 0 Information Obtained From Patient Wound History Do you currently have one or more open woundso Yes How many open wounds do you currently haveo 1 Approximately how long have you had your woundso 2 months How have you been treating your wound(s) until nowo prisma, silver alginate Has your wound(s) ever healed and then re-openedo No Have you had any lab work done in the past montho No Have you tested positive for an antibiotic resistant organism (MRSA, VRE)o No Have you tested positive for osteomyelitis (bone infection)o No Have you had any tests for circulation on your legso No Have you had other problems associated with your woundso Swelling Constitutional Symptoms (General Health) Complaints and Symptoms: No Complaints or Symptoms Eyes Medical History: Positive for: Cataracts Respiratory Medical History: Positive for: Chronic Obstructive Pulmonary Disease (COPD); Sleep Apnea Cardiovascular Medical History: Positive for: Hypotension; Myocardial Infarction - 2006 Endocrine Medical History: Positive for: Type II Diabetes Time with diabetes: 6 yrs Treated with: Insulin, Oral agents Blood sugar tested every day: No Musculoskeletal Medical History: Positive for: Osteoarthritis Neurologic GASPER, HOPES ([id number]) Medical History: Positive for: Neuropathy HBO Extended History Items Eyes: Cataracts Immunizations Pneumococcal Vaccine: Received Pneumococcal Vaccination: Yes Implantable Devices Family and Social History Cancer: No; Diabetes: Yes - Father; Heart Disease: Yes -  Mother,Father; Hereditary Spherocytosis: No; Hypertension: Yes - Father,Mother; Kidney Disease: No; Lung Disease: No; Seizures: No; Stroke: No; Thyroid Problems: No; Tuberculosis: No; Former smoker - quit 15 yrs ago; Marital Status - Married; Alcohol Use: Rarely; Drug Use: No History; Caffeine Use: Daily; Financial Concerns: No; Food, Clothing or Shelter Needs: No; Support System Lacking: No; Transportation Concerns: No; Advanced Directives: No; Patient does not want information on Advanced Directives; Do not resuscitate: No; Living Will: No; Medical Power of Attorney: No Electronic Signature(s) Signed: 05/16/2018 4:43:18 PM By: Alric Quan Signed: 05/16/2018 5:35:06 PM By: Linton Ham MD Entered By: Alric Quan on 05/16/2018 10:03:12 Andrew Lyons ([id number]) -------------------------------------------------------------------------------- SuperBill Details Patient Name: Andrew Lyons Date of Service: 05/16/2018 Medical Record Number: [id number] Patient Account Number: [id number] Date of Birth/Sex: Feb 20, 1946 (  72 y.o. 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, right lower leg, subsequent encounter Facility Procedures CPT4 Code: 21308657 Description: [facility] VISIT-LEV 3 EST PT Modifier: Quantity: 1 CPT4 Code: 84696295 Description: 28413 - DEB SUBQ TISSUE 20 SQ CM/< ICD-10 Diagnosis Description K44.010 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 of E11.622 Type 2 diabetes mellitus with other skin ulcer Modifier: muscle Quantity: 1 Physician  Procedures CPT4: Description Modifier Quantity Code [id number] - WC PHYS LEVEL 4 - EST PT 25 1 ICD-10 Diagnosis Description Y40.347 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

## 2018-05-30 ENCOUNTER — Encounter: Payer: PPO | Attending: Internal Medicine | Admitting: Internal Medicine

## 2018-05-30 DIAGNOSIS — E1140 Type 2 diabetes mellitus with diabetic neuropathy, unspecified: Secondary | ICD-10-CM | POA: Insufficient documentation

## 2018-05-30 DIAGNOSIS — M1990 Unspecified osteoarthritis, unspecified site: Secondary | ICD-10-CM | POA: Insufficient documentation

## 2018-05-30 DIAGNOSIS — E11622 Type 2 diabetes mellitus with other skin ulcer: Secondary | ICD-10-CM | POA: Insufficient documentation

## 2018-05-30 DIAGNOSIS — G4730 Sleep apnea, unspecified: Secondary | ICD-10-CM | POA: Diagnosis not present

## 2018-05-30 DIAGNOSIS — Z87891 Personal history of nicotine dependence: Secondary | ICD-10-CM | POA: Insufficient documentation

## 2018-05-30 DIAGNOSIS — I252 Old myocardial infarction: Secondary | ICD-10-CM | POA: Insufficient documentation

## 2018-05-30 DIAGNOSIS — Z89511 Acquired absence of right leg below knee: Secondary | ICD-10-CM | POA: Insufficient documentation

## 2018-05-30 DIAGNOSIS — L97313 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 of muscle: Secondary | ICD-10-CM | POA: Diagnosis not present

## 2018-05-30 DIAGNOSIS — J449 Chronic obstructive pulmonary disease, unspecified: Secondary | ICD-10-CM | POA: Diagnosis not present

## 2018-05-30 DIAGNOSIS — I10 Essential (primary) hypertension: Secondary | ICD-10-CM | POA: Insufficient documentation

## 2018-05-30 DIAGNOSIS — L89893 Pressure ulcer of other site, stage 3: Secondary | ICD-10-CM | POA: Diagnosis not present

## 2018-06-01 ENCOUNTER — Ambulatory Visit: Payer: PPO | Admitting: Cardiovascular Disease

## 2018-06-01 NOTE — Progress Notes (Signed)
KENON, DELASHMIT ([id number]) Visit Report for 05/23/2018 Debridement Details Patient Name: Andrew Lyons, Andrew Lyons Date of Service: 05/23/2018 10:15 AM Medical Record Number: [id number] Patient Account Number: [id number] Date of Birth/Sex: 1946-09-12 (72 y.o. M) Treating RN: Roger Shelter Primary Care Provider: [name], Delfino Lovett Other Clinician: Referring Provider: [name] Treating Provider/Extender: [name] in Treatment: 1 Debridement Performed for Wound #3 Right Amputation Site - Below Knee Assessment: Performed By: Physician [name], MD Debridement Type: Debridement Severity of Tissue Pre Fat layer exposed Debridement: Pre-procedure Verification/Time Yes - 10:55 Out Taken: Start Time: 10:55 Pain Control: Other : lidocaine 4% Total Area Debrided (L x W): 0.7 (cm) x 0.37 (cm) = 0.26 (cm) Tissue and other material Viable, Non-Viable, Callus, Slough, Subcutaneous, Biofilm, Slough debrided: Level: Skin/Subcutaneous Tissue Debridement Description: Excisional Instrument: Curette Bleeding: Moderate Hemostasis Achieved: Pressure End Time: 10:59 Procedural Pain: 0 Post Procedural Pain: 0 Response to Treatment: Procedure was tolerated well Level of Consciousness: Awake and Alert Post Debridement Measurements of Total Wound Length: (cm) 0.7 Stage: Category/Stage III Width: (cm) 0.7 Depth: (cm) 0.6 Volume: (cm) 0.231 Character of Wound/Ulcer Post Stable Debridement: Severity of Tissue Post Debridement: Fat layer exposed Post Procedure Diagnosis Same as Pre-procedure Electronic Signature(s) Signed: 05/23/2018 3:09:46 PM By: Roger Shelter Signed: 05/23/2018 5:12:47 PM By: Linton Ham MD Entered By: Roger Shelter on 05/23/2018 10:58:49 Andrew Lyons ([id number]) -------------------------------------------------------------------------------- HPI Details Patient Name: Andrew Lyons Date of Service: 05/23/2018  10:15 AM Medical Record Number: [id number] Patient Account Number: [id number] Date of Birth/Sex: 1945-11-09 (72 y.o. M) Treating RN: Cornell Barman Primary Care Provider: [name], Delfino Lovett Other Clinician: Referring Provider: [name], [name] Treating Provider/Extender: [name] in Treatment: 1 History of Present Illness HPI Description: 01/04/17; this is a 72 year old diabetic man who has a remote history of a traumatic lower extremity damage at age 27 requiring an amputation. He tells me spent 2 years walking on crutches then ultimately has been walking on prosthesis without any trouble since then. Several months ago he had a new prosthesis and developed an open area in the right stump in December. He saw podiatry Dr. Prudence Davidson who noted that this was really out of his practice jurisdiction and referred him here. He has been using mupirocin. He has continued to walk on the prosthesis. The prosthesis itself as been adjusted by the prostatitis. He does not have a known arterial issue. He tells me he probably has diabetic neuropathy. He had a wound that took a long time to heal surrounding the actual amputation itself however is not had more recent wounds. 01/10/17 Culture from last week grew pseudomonas. change AB to cefdinir yesterday from doxy. he is not using his prosthesis which I emphasized although he is having it adjusted next week 01/17/17; he is completing his antibiotics in the next day or 2. We've been using silver alginate. His prosthesis is away being readjusted. He had an x-ray of the underlying bone here apparently at his podiatrist's office although I'll need to have a look at that and we can't find the result then he may need this re-x-rayed. 01/24/17; he has completed his antibiotics. Change to Silver collagen last week. No real change in the wound this week. 01/31/17 repeat culture I did last week showed a few Pseudomonas. This only has intermediate sensitivity to  quinolones I therefore put him back on a week of Cefdinir. X-ray of the stump did not show any bone destruction. We are attempting to get vascular  studies done through Dr.Arida's office on May 23. 02/07/17; arterial studies are booked for May 23 no major change. He has completed his antibiotics 02/14/17 arterial studies on Thursday 5/23. using collagen 02/21/17; the patient had his arterial studies. Both his ABIs were good at 1.0 on the right and 1.1 on the left. His abdominal and iliac arteries revealed atherosclerosis without focal stenosis duplex imaging of the right lower leg revealed heterogeneous plaque and biphasic Doppler waveforms from the right CFA to the popliteal artery. The right distal SFA and tibial peroneal trunk velocities were elevated. 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 The proximal right ATA and PTA were patent above the amputation level. We have been using Silver collagen with some improvement in the wound dimensions 02/28/17; we went ahead and did TCOM measurements on this patient. One of the leads was nonfunctional however on the right leg both the proximal and periwound leads registered greater than 70 mmHg oxygen tension. Interestingly the comparison lead on the left leg/relative wound level was in the mid 40s. This allays any fears I had about contributing wound ischemia. In the meantime is measurements today were 0.2 x 0.4 x 0.7 the wound is now a small divot most of it appears to be epithelialized with only a small open area at that depth. There is no evidence of surrounding infection 03/07/17; continued improvement down to 0.3 cm in depth. This is a very tiny circular opening. No drainage no tenderness. Looks like it is been progressing towards closure. He has been using Silver collagen although I think this is too small to use this as a primary dressing. 03/14/17; still 0.3 cm in depth tiny circular wound. No drainage or tenderness. Used Iodosorb  ointment starting last week 03/21/17; still the depth and this wound. Using a #3 curet I remove necrotic material from the wound base. There is a divot here. Not making enough improvement on the Iodosorb, I change back to silver collagen this week. 03/28/17; depth down to 0.2. I cannot actually see a surface of this wound. We've been using silver collagen which I will continue 04/04/17 on evaluation today patient appears to be doing very well. In fact his wound appears to be completely healed though there is a slight eschar over the area I see no fluctuance underneath and I am reluctant to attempt scraping on the area due to the very center superficial nature of the eschar. Patient has no discomfort. 04/11/17; the patient went back to [facility] to his prosthetist however his leg was too swollen on the right deficient into the prosthesis. The wound itself on the tip of his stump on the right is closed and was close last week. I see no reason to be concerned about this. Andrew Lyons, Andrew Lyons ([id number]) READMISSION 10/25/17; this is a patient that we had in clinic from April to July 2018 with a probing tiny open area on his right below sided below-knee amputation site. X-ray of this area did not show osteomyelitis. I had some concern about this being ischemic for a while although we did ABIs and T Coms that did a lot to allay these concerns. The patient is a type II diabetic although I don't have a recent hemoglobin A1c. He tells me that after he left the clinic last time he got a new prosthesis, not the prosthesis that originally caused wounds in this area. He states about a month ago used noted some dark discoloration in his skin or at least his wife  did when he was getting out of the shower. He since has noted increasing pain and tenderness when he walks around this area. He arrives in clinic with no open wound 11/01/17; small probing wound on the medial aspect of the stump from a distal amputation  site remotely. Surprisingly culture I did last week showed gram-positive cocci but did not culture. X-ray showed edema but no bony abnormality no soft tissue gas etc. we've been using silver alginate. He is offloading this using a scooter 11/08/17; small probing wound on the medial aspect of his stump from a distal amputation done remotely. He arrived in here with a small probing hold the did not go to bone. Culture I did of the discharged from here was negative. Nevertheless I gave him a week's worth of Augmentin wondering whether this could've been anaerobic. He is not having any pain versus when he came in here. He does have callus on the medial aspect of the stump and in my mind this represents excess pressure on this area when he walks. The area has completely closed once again READMISSION 05/16/18 This is the third admission to the clinic for this patient who had a very distal below-knee amputation many years ago. She uses a modified prosthesis. We have had problems with recurrent wounds in the medial part of the stump which is a small area originally his distal tibia. We've been able to get this to close on 2 occasions. He tells me that this reopened about 2 months ago. Since then he's been "doctoring on it" himself I think packing it with collagen and covering it with alginate he had left over from previous visits. Previous plain x-rays of this area have not shown soft tissue or bone issues on the stump.he tells me he went to see vein and vascular in [place] in follow-up of this area although I don't have this in my notes I'll need to research this in Epic. By his description he says that the blood flow on the right was better than the left. He is a poorly controlled diabetic with a recent hemoglobin A1c of 9 last month he has not yet on insulin. On 02/17/17 he had arterial studies. These were generally quite good with an ABI on the right of 1 on the left of 1.1. Duplex imaging with color  Doppler of the right lower leg showed heterogeneous plaque and biphasic Doppler waveforms from the right SFA to the popliteal artery. Right distal SFA and tibial peroneal trunk velocities were elevated. He was felt to have a 30- 49% distal SFA stenosis however the right ATA and PTA were felt to be patent above the amputation level. I do not have that he actually saw a vascular surgeon I'll need to research this in Epic E May have actually been referring to the test just quoted 05/23/18; x-ray I did last week was negative for osteomyelitis. Culture the wound that probed to bone last time was negative [but not a bone culture]. We've been using silver alginate packing. This is now the third occasion that this wound has opened in precisely the same area with precisely the same appearance. The patient and his wife are somewhat frustrated by this. They have made all modifications to his prosthetic to offload this area. Nevertheless he develops callus with nonviable tissue underneath and a reopening of this wound. I really do not believe that he has underlying osteomyelitis although at this point I am going to have to rule that out. I've ordered  an unenhanced CT scan of the bone underneath his distal amputation. He also has some degree of PAD based on arterial studies done on 02/16/17. At that point duplex imaging suggested that the abdominal aorta and iliac vessels showed atherosclerosis without stenosis. There was heterogeneous plaque with biphasic Doppler waveforms in the right CFA to the popliteal artery right distal left SFA and tibial peroneal trunk velocities were elevated. I did a TCOM at that time that showed good results. We did get the wound to heal at that time. The wound remained closed for about 6 months only to reopen recently. Electronic Signature(s) Signed: 05/23/2018 5:12:47 PM By: Linton Ham MD Entered By: Linton Ham on 05/23/2018 12:25:46 Andrew Lyons  ([id number]) -------------------------------------------------------------------------------- Physical Exam Details Patient Name: Andrew Lyons Date of Service: 05/23/2018 10:15 AM Medical Record Number: [id number] Patient Account Number: [id number] Date of Birth/Sex: [birth year] (72 y.o. M) Treating RN: Cornell Barman Primary Care Provider: [name], Delfino Lovett Other Clinician: Referring Provider: [name], [name] Treating Provider/Extender: [name] in Treatment: 1 Constitutional Patient is hypertensive.. Pulse regular and within target range for patient.Marland Kitchen Respirations regular, non-labored and within target range.. Temperature is normal and within the target range for the patient.Marland Kitchen appears in no distress. Cardiovascular popliteal pulse palpable on the right. Notes wound exam; once again the wound is debrided of circumferential callus/thick skin and nonviable debris over the wound surface. Hemostasis with direct pressure. This no longer probes to bone. The site on the medial aspect of the stump has thick tissue probably indicative of pressure Electronic Signature(s) Signed: 05/23/2018 5:12:47 PM By: Linton Ham MD Entered By: Linton Ham on 05/23/2018 12:27:57 Andrew Lyons ([id number]) -------------------------------------------------------------------------------- Physician Orders Details Patient Name: Andrew Lyons Date of Service: 05/23/2018 10:15 AM Medical Record Number: [id number] Patient Account Number: [id number] Date of Birth/Sex: 11-25-1945 (72 y.o. M) Treating RN: Roger Shelter Primary Care Provider: [name], Delfino Lovett Other Clinician: Referring Provider: [name] Treating Provider/Extender: [name] in Treatment: 1 Verbal / Phone Orders: No Diagnosis Coding Wound Cleansing Wound #3 Right Amputation Site - Below Knee o Clean wound with Normal Saline. o May Shower, gently pat wound dry prior to  applying new dressing. Anesthetic (add to Medication List) Wound #3 Right Amputation Site - Below Knee o Topical Lidocaine 4% cream applied to wound bed prior to debridement (In Clinic Only). Primary Wound Dressing Wound #3 Right Amputation Site - Below Knee o Silver Alginate Secondary Dressing Wound #3 Right Amputation Site - Below Knee o Foam o [place] Dressing Change Frequency Wound #3 Right Amputation Site - Below Knee o Change dressing every other day. Follow-up Appointments Wound #3 Right Amputation Site - Below Knee o Return Appointment in 1 week. Off-Loading Wound #3 Right Amputation Site - Below Knee o Other: - do not wear your prosthesis Consults o Vascular - Dr. Sophronia Simas for consult Radiology o Computed Tomography (CT) Scan - Right stump; Patient has hardware in Leg Electronic Signature(s) Signed: 05/25/2018 4:06:12 PM By: Alric Quan Signed: 05/30/2018 4:51:46 PM By: Linton Ham MD Andrew Lyons ([id number]) Previous Signature: 05/23/2018 3:09:46 PM Version By: Roger Shelter Previous Signature: 05/23/2018 5:12:47 PM Version By: Linton Ham MD Entered By: Alric Quan on 05/25/2018 13:11:36 Andrew Lyons ([id number]) -------------------------------------------------------------------------------- Progress Note Details Patient Name: Andrew Lyons Date of Service: 05/23/2018 10:15 AM Medical Record Number: [id number] Patient Account Number: [id number] Date of Birth/Sex: 12-26-1945 (72 y.o. M) Treating RN: Cornell Barman Primary Care Provider: [name], Delfino Lovett  Other Clinician: Referring Provider: [name], [name] Treating Provider/Extender: [name] in Treatment: 1 Subjective History of Present Illness (HPI) 01/04/17; this is a 72 year old diabetic man who has a remote history of a traumatic lower extremity damage at age 34 requiring an amputation. He tells me spent 2 years walking on crutches then  ultimately has been walking on prosthesis without any trouble since then. Several months ago he had a new prosthesis and developed an open area in the right stump in December. He saw podiatry Dr. Prudence Davidson who noted that this was really out of his practice jurisdiction and referred him here. He has been using mupirocin. He has continued to walk on the prosthesis. The prosthesis itself as been adjusted by the prostatitis. He does not have a known arterial issue. He tells me he probably has diabetic neuropathy. He had a wound that took a long time to heal surrounding the actual amputation itself however is not had more recent wounds. 01/10/17 Culture from last week grew pseudomonas. change AB to cefdinir yesterday from doxy. he is not using his prosthesis which I emphasized although he is having it adjusted next week 01/17/17; he is completing his antibiotics in the next day or 2. We've been using silver alginate. His prosthesis is away being readjusted. He had an x-ray of the underlying bone here apparently at his podiatrist's office although I'll need to have a look at that and we can't find the result then he may need this re-x-rayed. 01/24/17; he has completed his antibiotics. Change to Silver collagen last week. No real change in the wound this week. 01/31/17 repeat culture I did last week showed a few Pseudomonas. This only has intermediate sensitivity to quinolones I therefore put him back on a week of Cefdinir. X-ray of the stump did not show any bone destruction. We are attempting to get vascular studies done through Dr.Arida's office on May 23. 02/07/17; arterial studies are booked for May 23 no major change. He has completed his antibiotics 02/14/17 arterial studies on Thursday 5/23. using collagen 02/21/17; the patient had his arterial studies. Both his ABIs were good at 1.0 on the right and 1.1 on the left. His abdominal and iliac arteries revealed atherosclerosis without focal stenosis duplex  imaging of the right lower leg revealed heterogeneous plaque and biphasic Doppler waveforms from the right CFA to the popliteal artery. The right distal SFA and tibial peroneal trunk velocities were elevated. 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 The proximal right ATA and PTA were patent above the amputation level. We have been using Silver collagen with some improvement in the wound dimensions 02/28/17; we went ahead and did TCOM measurements on this patient. One of the leads was nonfunctional however on the right leg both the proximal and periwound leads registered greater than 70 mmHg oxygen tension. Interestingly the comparison lead on the left leg/relative wound level was in the mid 40s. This allays any fears I had about contributing wound ischemia. In the meantime is measurements today were 0.2 x 0.4 x 0.7 the wound is now a small divot most of it appears to be epithelialized with only a small open area at that depth. There is no evidence of surrounding infection 03/07/17; continued improvement down to 0.3 cm in depth. This is a very tiny circular opening. No drainage no tenderness. Looks like it is been progressing towards closure. He has been using Silver collagen although I think this is too small to use this as  a primary dressing. 03/14/17; still 0.3 cm in depth tiny circular wound. No drainage or tenderness. Used Iodosorb ointment starting last week 03/21/17; still the depth and this wound. Using a #3 curet I remove necrotic material from the wound base. There is a divot here. Not making enough improvement on the Iodosorb, I change back to silver collagen this week. 03/28/17; depth down to 0.2. I cannot actually see a surface of this wound. We've been using silver collagen which I will continue 04/04/17 on evaluation today patient appears to be doing very well. In fact his wound appears to be completely healed though there is a slight eschar over the area I see no  fluctuance underneath and I am reluctant to attempt scraping on the area due to the very center superficial nature of the eschar. Patient has no discomfort. 04/11/17; the patient went back to [facility] to his prosthetist however his leg was too swollen on the right deficient into the prosthesis. The wound itself on the tip of his stump on the right is closed and was close last week. I see no reason to be Andrew Lyons, Andrew T. ([id number]) concerned about this. READMISSION 10/25/17; this is a patient that we had in clinic from April to July 2018 with a probing tiny open area on his right below sided below-knee amputation site. X-ray of this area did not show osteomyelitis. I had some concern about this being ischemic for a while although we did ABIs and T Coms that did a lot to allay these concerns. The patient is a type II diabetic although I don't have a recent hemoglobin A1c. He tells me that after he left the clinic last time he got a new prosthesis, not the prosthesis that originally caused wounds in this area. He states about a month ago used noted some dark discoloration in his skin or at least his wife did when he was getting out of the shower. He since has noted increasing pain and tenderness when he walks around this area. He arrives in clinic with no open wound 11/01/17; small probing wound on the medial aspect of the stump from a distal amputation site remotely. Surprisingly culture I did last week showed gram-positive cocci but did not culture. X-ray showed edema but no bony abnormality no soft tissue gas etc. we've been using silver alginate. He is offloading this using a scooter 11/08/17; small probing wound on the medial aspect of his stump from a distal amputation done remotely. He arrived in here with a small probing hold the did not go to bone. Culture I did of the discharged from here was negative. Nevertheless I gave him a week's worth of Augmentin wondering whether this could've  been anaerobic. He is not having any pain versus when he came in here. He does have callus on the medial aspect of the stump and in my mind this represents excess pressure on this area when he walks. The area has completely closed once again READMISSION 05/16/18 This is the third admission to the clinic for this patient who had a very distal below-knee amputation many years ago. She uses a modified prosthesis. We have had problems with recurrent wounds in the medial part of the stump which is a small area originally his distal tibia. We've been able to get this to close on 2 occasions. He tells me that this reopened about 2 months ago. Since then he's been "doctoring on it" himself I think packing it with collagen and covering it with alginate  he had left over from previous visits. Previous plain x-rays of this area have not shown soft tissue or bone issues on the stump.he tells me he went to see vein and vascular in [place] in follow-up of this area although I don't have this in my notes I'll need to research this in Epic. By his description he says that the blood flow on the right was better than the left. He is a poorly controlled diabetic with a recent hemoglobin A1c of 9 last month he has not yet on insulin. On 02/17/17 he had arterial studies. These were generally quite good with an ABI on the right of 1 on the left of 1.1. Duplex imaging with color Doppler of the right lower leg showed heterogeneous plaque and biphasic Doppler waveforms from the right SFA to the popliteal artery. Right distal SFA and tibial peroneal trunk velocities were elevated. He was felt to have a 30- 49% distal SFA stenosis however the right ATA and PTA were felt to be patent above the amputation level. I do not have that he actually saw a vascular surgeon I'll need to research this in Epic E May have actually been referring to the test just quoted 05/23/18; x-ray I did last week was negative for osteomyelitis. Culture  the wound that probed to bone last time was negative [but not a bone culture]. We've been using silver alginate packing. This is now the third occasion that this wound has opened in precisely the same area with precisely the same appearance. The patient and his wife are somewhat frustrated by this. They have made all modifications to his prosthetic to offload this area. Nevertheless he develops callus with nonviable tissue underneath and a reopening of this wound. I really do not believe that he has underlying osteomyelitis although at this point I am going to have to rule that out. I've ordered an unenhanced CT scan of the bone underneath his distal amputation. He also has some degree of PAD based on arterial studies done on 02/16/17. At that point duplex imaging suggested that the abdominal aorta and iliac vessels showed atherosclerosis without stenosis. There was heterogeneous plaque with biphasic Doppler waveforms in the right CFA to the popliteal artery right distal left SFA and tibial peroneal trunk velocities were elevated. I did a TCOM at that time that showed good results. We did get the wound to heal at that time. The wound remained closed for about 6 months only to reopen recently. [address] Andrew Lyons, Andrew Lyons ([id number]) Constitutional Patient is hypertensive.. Pulse regular and within target range for patient.Marland Kitchen Respirations regular, non-labored and within target range.. Temperature is normal and within the target range for the patient.Marland Kitchen appears in no distress. Vitals Time Taken: 10:30 AM, Height: 70 in, Weight: 264.5 lbs, BMI: 37.9, Temperature: 98.5 F, Pulse: 66 bpm, Respiratory Rate: 18 breaths/min, Blood Pressure: 148/70 mmHg. Cardiovascular popliteal pulse palpable on the right. General Notes: wound exam; once again the wound is debrided of circumferential callus/thick skin and nonviable debris over the wound surface. Hemostasis with direct pressure. This no longer probes to  bone. The site on the medial aspect of the stump has thick tissue probably indicative of pressure Integumentary (Hair, Skin) Wound #3 status is Open. Original cause of wound was Pressure Injury. 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 The wound measures 0.7cm length x 0.7cm width x 0.6cm depth; 0.385cm^2 area and 0.231cm^3 volume. There is Fat Layer (Subcutaneous Tissue) Exposed exposed. There is no tunneling  or undermining noted. There is a none present amount of drainage noted. The wound margin is distinct with the outline attached to the wound base. There is no granulation within the wound bed. There is a large (67-100%) amount of necrotic tissue within the wound bed including Eschar and Adherent Slough. The periwound skin appearance exhibited: Callus. The periwound skin appearance did not exhibit: Dry/Scaly, Maceration. Periwound temperature was noted as No Abnormality. The periwound has tenderness on palpation. 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 There was a Excisional Skin/Subcutaneous Tissue Debridement with a total area of 0.26 sq cm performed by [name], MD. With the following instrument(s): Curette to remove Viable and Non-Viable tissue/material. Material removed includes Callus, Subcutaneous Tissue, Slough, and Biofilm after achieving pain control using Other (lidocaine 4%). No specimens were taken. A time out was conducted at 10:55, prior to the start of the procedure. A Moderate amount of bleeding was controlled with Pressure. The procedure was tolerated well with a pain level of 0 throughout and a pain level of 0 following the procedure. Patient s Level of Consciousness post procedure was recorded as Awake and Alert. Post Debridement Measurements: 0.7cm length x 0.7cm width x 0.6cm depth; 0.231cm^3  volume. Post debridement Stage noted as Category/Stage III. Character of Wound/Ulcer Post Debridement is stable. Severity of Tissue Post Debridement is: Fat layer exposed. Post procedure Diagnosis Wound #3: Same as Pre-Procedure Plan Wound Cleansing: Wound #3 Right Amputation Site - Below Knee: Clean wound with Normal Saline. May Shower, gently pat wound dry prior to applying new dressing. Anesthetic (add to Medication List): Andrew Lyons, Andrew Lyons ([id number]) Wound #3 Right Amputation Site - Below Knee: Topical Lidocaine 4% cream applied to wound bed prior to debridement (In Clinic Only). 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 Follow-up Appointments: Wound #3 Right Amputation Site - Below Knee: Return Appointment in 1 week. Off-Loading: Wound #3 Right Amputation Site - Below Knee: Other: - do not wear your prosthesis Consults ordered were: Vascular - Dr. Sophronia Simas for consult Radiology ordered were: Computed Tomography (CT) Scan - Right stump; Patient has hardware in Leg #1 we'll continue with silver alginate packing and border foam #2 the patient is offloading by using his scooter #3 at this point I am going to try to look at some issues that I have not previously thought were contributing including the possibility of underlying osteomyelitis. He has old hardware in the right leg I've ordered a CT scan of the stump. I would also like to get a vascular review of the right leg to make sure that arterial insufficiency isn't contributing to this. He did have studies done on 02/16/17 as well as normal TCOM in the same timeframe. #4 I continue to think most of this his pressure related even though he has had multiple prosthetic modifications. The  patient asked me last week about a stump revision and after we look at the issues listed in 3 above I am going to consider referring him to orthopedics. Previously we've been able to get this area to heal but we've not been able to maintain a healed state Electronic Signature(s) Signed: 05/23/2018 5:12:47 PM By: Linton Ham  MD Entered By: Linton Ham on 05/23/2018 12:30:38 Andrew Lyons ([id number]) -------------------------------------------------------------------------------- SuperBill Details Patient Name: Andrew Lyons Date of Service: 05/23/2018 Medical Record Number: [id number] Patient Account Number: [id number] Date of Birth/Sex: 1945/11/23 (72 y.o. 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, right lower leg, subsequent encounter Facility Procedures CPT4 Code: [id number] Description: 78676 - DEB SUBQ TISSUE 20 SQ CM/< ICD-10 Diagnosis Description 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

## 2018-06-03 NOTE — Progress Notes (Signed)
Andrew Lyons, Andrew Lyons ([id number]) Visit Report for 05/30/2018 Debridement Details Patient Name: Andrew Lyons, Andrew Lyons Date of Service: 05/30/2018 10:15 AM Medical Record Number: [id number] Patient Account Number: [id number] Date of Birth/Sex: 1946-09-15 (72 y.o. M) Treating RN: Cornell Barman Primary Care Provider: [name], Delfino Lovett Other Clinician: Referring Provider: [name], [name] Treating Provider/Extender: [name] in Treatment: 2 Debridement Performed for Wound #3 Right Amputation Site - Below Knee Assessment: Performed By: Physician [name], MD Debridement Type: Debridement Severity of Tissue Pre Fat layer exposed Debridement: Pre-procedure Verification/Time Yes - 10:28 Out Taken: Start Time: 10:28 Pain Control: Other : lidocaine 4% Total Area Debrided (L x W): 0.3 (cm) x 0.3 (cm) = 0.09 (cm) Tissue and other material Subcutaneous debrided: Level: Skin/Subcutaneous Tissue Debridement Description: Excisional Instrument: Curette Bleeding: Minimum Hemostasis Achieved: Pressure End Time: 10:30 Response to Treatment: Procedure was tolerated well Level of Consciousness: Awake and Alert Post Debridement Measurements of Total Wound Length: (cm) 0.3 Stage: Category/Stage III Width: (cm) 0.3 Depth: (cm) 0.6 Volume: (cm) 0.042 Character of Wound/Ulcer Post Stable Debridement: Severity of Tissue Post Debridement: Fat layer exposed Post Procedure Diagnosis Same as Pre-procedure Electronic Signature(s) Signed: 05/30/2018 4:51:46 PM By: Linton Ham MD Signed: 05/31/2018 5:22:10 PM By: Gretta Cool, BSN, RN, CWS, Kim RN, BSN Entered By: Linton Ham on 05/30/2018 10:54:14 Andrew Lyons ([id number]) -------------------------------------------------------------------------------- HPI Details Patient Name: Andrew Lyons Date of Service: 05/30/2018 10:15 AM Medical Record Number: [id number] Patient Account Number: [id number] Date of  Birth/Sex: Aug 10, 1946 (72 y.o. M) Treating RN: Cornell Barman Primary Care Provider: [name], Delfino Lovett Other Clinician: Referring Provider: [name], [name] Treating Provider/Extender: [name] in Treatment: 2 History of Present Illness HPI Description: 01/04/17; this is a 72 year old diabetic man who has a remote history of a traumatic lower extremity damage at age 33 requiring an amputation. He tells me spent 2 years walking on crutches then ultimately has been walking on prosthesis without any trouble since then. Several months ago he had a new prosthesis and developed an open area in the right stump in December. He saw podiatry Dr. Prudence Davidson who noted that this was really out of his practice jurisdiction and referred him here. He has been using mupirocin. He has continued to walk on the prosthesis. The prosthesis itself as been adjusted by the prostatitis. He does not have a known arterial issue. He tells me he probably has diabetic neuropathy. He had a wound that took a long time to heal surrounding the actual amputation itself however is not had more recent wounds. 01/10/17 Culture from last week grew pseudomonas. change AB to cefdinir yesterday from doxy. he is not using his prosthesis which I emphasized although he is having it adjusted next week 01/17/17; he is completing his antibiotics in the next day or 2. We've been using silver alginate. His prosthesis is away being readjusted. He had an x-ray of the underlying bone here apparently at his podiatrist's office although I'll need to have a look at that and we can't find the result then he may need this re-x-rayed. 01/24/17; he has completed his antibiotics. Change to Silver collagen last week. No real change in the wound this week. 01/31/17 repeat culture I did last week showed a few Pseudomonas. This only has intermediate sensitivity to quinolones I therefore put him back on a week of Cefdinir. X-ray of the stump did not show any  bone destruction. We are attempting to get vascular studies done through Dr.Arida's office on May 23.  02/07/17; arterial studies are booked for May 23 no major change. He has completed his antibiotics 02/14/17 arterial studies on Thursday 5/23. using collagen 02/21/17; the patient had his arterial studies. Both his ABIs were good at 1.0 on the right and 1.1 on the left. His abdominal and iliac arteries revealed atherosclerosis without focal stenosis duplex imaging of the right lower leg revealed heterogeneous plaque and biphasic Doppler waveforms from the right CFA to the popliteal artery. The right distal SFA and tibial peroneal trunk velocities were elevated. 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 The proximal right ATA and PTA were patent above the amputation level. We have been using Silver collagen with some improvement in the wound dimensions 02/28/17; we went ahead and did TCOM measurements on this patient. One of the leads was nonfunctional however on the right leg both the proximal and periwound leads registered greater than 70 mmHg oxygen tension. Interestingly the comparison lead on the left leg/relative wound level was in the mid 40s. This allays any fears I had about contributing wound ischemia. In the meantime is measurements today were 0.2 x 0.4 x 0.7 the wound is now a small divot most of it appears to be epithelialized with only a small open area at that depth. There is no evidence of surrounding infection 03/07/17; continued improvement down to 0.3 cm in depth. This is a very tiny circular opening. No drainage no tenderness. Looks like it is been progressing towards closure. He has been using Silver collagen although I think this is too small to use this as a primary dressing. 03/14/17; still 0.3 cm in depth tiny circular wound. No drainage or tenderness. Used Iodosorb ointment starting last week 03/21/17; still the depth and this wound. Using a #3 curet I remove  necrotic material from the wound base. There is a divot here. Not making enough improvement on the Iodosorb, I change back to silver collagen this week. 03/28/17; depth down to 0.2. I cannot actually see a surface of this wound. We've been using silver collagen which I will continue 04/04/17 on evaluation today patient appears to be doing very well. In fact his wound appears to be completely healed though there is a slight eschar over the area I see no fluctuance underneath and I am reluctant to attempt scraping on the area due to the very center superficial nature of the eschar. Patient has no discomfort. 04/11/17; the patient went back to [facility] to his prosthetist however his leg was too swollen on the right deficient into the prosthesis. The wound itself on the tip of his stump on the right is closed and was close last week. I see no reason to be concerned about this. ARAGORN, RECKER ([id number]) READMISSION 10/25/17; this is a patient that we had in clinic from April to July 2018 with a probing tiny open area on his right below sided below-knee amputation site. X-ray of this area did not show osteomyelitis. I had some concern about this being ischemic for a while although we did ABIs and T Coms that did a lot to allay these concerns. The patient is a type II diabetic although I don't have a recent hemoglobin A1c. He tells me that after he left the clinic last time he got a new prosthesis, not the prosthesis that originally caused wounds in this area. He states about a month ago used noted some dark discoloration in his skin or at least his wife did when he was getting out of the  shower. He since has noted increasing pain and tenderness when he walks around this area. He arrives in clinic with no open wound 11/01/17; small probing wound on the medial aspect of the stump from a distal amputation site remotely. Surprisingly culture I did last week showed gram-positive cocci but did not  culture. X-ray showed edema but no bony abnormality no soft tissue gas etc. we've been using silver alginate. He is offloading this using a scooter 11/08/17; small probing wound on the medial aspect of his stump from a distal amputation done remotely. He arrived in here with a small probing hold the did not go to bone. Culture I did of the discharged from here was negative. Nevertheless I gave him a week's worth of Augmentin wondering whether this could've been anaerobic. He is not having any pain versus when he came in here. He does have callus on the medial aspect of the stump and in my mind this represents excess pressure on this area when he walks. The area has completely closed once again READMISSION 05/16/18 This is the third admission to the clinic for this patient who had a very distal below-knee amputation many years ago. She uses a modified prosthesis. We have had problems with recurrent wounds in the medial part of the stump which is a small area originally his distal tibia. We've been able to get this to close on 2 occasions. He tells me that this reopened about 2 months ago. Since then he's been "doctoring on it" himself I think packing it with collagen and covering it with alginate he had left over from previous visits. Previous plain x-rays of this area have not shown soft tissue or bone issues on the stump.he tells me he went to see vein and vascular in [place] in follow-up of this area although I don't have this in my notes I'll need to research this in Epic. By his description he says that the blood flow on the right was better than the left. He is a poorly controlled diabetic with a recent hemoglobin A1c of 9 last month he has not yet on insulin. On 02/17/17 he had arterial studies. These were generally quite good with an ABI on the right of 1 on the left of 1.1. Duplex imaging with color Doppler of the right lower leg showed heterogeneous plaque and biphasic Doppler waveforms  from the right SFA to the popliteal artery. Right distal SFA and tibial peroneal trunk velocities were elevated. He was felt to have a 30- 49% distal SFA stenosis however the right ATA and PTA were felt to be patent above the amputation level. I do not have that he actually saw a vascular surgeon I'll need to research this in Epic E May have actually been referring to the test just quoted 05/23/18; x-ray I did last week was negative for osteomyelitis. Culture the wound that probed to bone last time was negative [but not a bone culture]. We've been using silver alginate packing. This is now the third occasion that this wound has opened in precisely the same area with precisely the same appearance. The patient and his wife are somewhat frustrated by this. They have made all modifications to his prosthetic to offload this area. Nevertheless he develops callus with nonviable tissue underneath and a reopening of this wound. I really do not believe that he has underlying osteomyelitis although at this point I am going to have to rule that out. I've ordered an unenhanced CT scan of the bone underneath  his distal amputation. He also has some degree of PAD based on arterial studies done on 02/16/17. At that point duplex imaging suggested that the abdominal aorta and iliac vessels showed atherosclerosis without stenosis. There was heterogeneous plaque with biphasic Doppler waveforms in the right CFA to the popliteal artery right distal left SFA and tibial peroneal trunk velocities were elevated. I did a TCOM at that time that showed good results. We did get the wound to heal at that time. The wound remained closed for about 6 months only to reopen recently. 05/30/18; the patient has his consult with Dr. Fletcher Anon on 06/04/18. his CT scan of the bone on the amputation site is apparently booked for 06/14/18. We are going to look over all of this to make sure there isn't a contributing factor of the recurrent breakdown in  this area other than the prosthesis and the resultant pressure. This is the third recurrence of the wound and the precisely the same area. On presentation this goes down to bone. We've been using silver alginate packing Electronic Signature(s) Signed: 05/30/2018 4:51:46 PM By: Linton Ham MD Entered By: Linton Ham on 05/30/2018 10:56:05 Andrew Lyons ([id number]) -------------------------------------------------------------------------------- Physical Exam Details Patient Name: Andrew Lyons Date of Service: 05/30/2018 10:15 AM Medical Record Number: [id number] Patient Account Number: [id number] Date of Birth/Sex: 1945/10/28 (72 y.o. M) Treating RN: Cornell Barman Primary Care Provider: [name], Delfino Lovett Other Clinician: Referring Provider: [name], [name] Treating Provider/Extender: [name] in Treatment: 2 Constitutional Sitting or standing Blood Pressure is within target range for patient.. Pulse regular and within target range for patient.Marland Kitchen Respirations regular, non-labored and within target range.. Temperature is normal and within the target range for the patient.Marland Kitchen appears in no distress. Cardiovascular popliteal pulse on the right is easily palpable. Notes wound exam; once again the wound is debrided of circumferential callous thick skin and subcutaneous tissue. This clearly does not probe to bone which is an improvement. There is no drainage and no obvious infection around the wound area. Electronic Signature(s) Signed: 05/30/2018 4:51:46 PM By: Linton Ham MD Entered By: Linton Ham on 05/30/2018 10:57:07 Andrew Lyons ([id number]) -------------------------------------------------------------------------------- Physician Orders Details Patient Name: Andrew Lyons Date of Service: 05/30/2018 10:15 AM Medical Record Number: [id number] Patient Account Number: [id number] Date of Birth/Sex: [birth year] (72 y.o. M) Treating RN: Cornell Barman Primary Care Provider: [name], Delfino Lovett Other Clinician: Referring Provider: [name], [name] Treating Provider/Extender: [name] in Treatment: 2 Verbal / Phone Orders: No Diagnosis Coding Wound Cleansing Wound #3 Right Amputation Site - Below Knee o Clean wound with Normal Saline. o May Shower, gently pat wound dry prior to applying new dressing. Anesthetic (add to Medication List) Wound #3 Right Amputation Site - Below Knee o Topical Lidocaine 4% cream applied to wound bed prior to debridement (In Clinic Only). Primary Wound Dressing Wound #3 Right Amputation Site - Below Knee o Silver Alginate Secondary Dressing Wound #3 Right Amputation Site - Below Knee o Foam Dressing Change Frequency Wound #3 Right Amputation Site - Below Knee o Change dressing every other day. Follow-up Appointments Wound #3 Right Amputation Site - Below Knee o Return Appointment in 1 week. Off-Loading Wound #3 Right Amputation Site - Below Knee o Other: - do not wear your prosthesis Electronic Signature(s) Signed: 05/30/2018 4:51:46 PM By: Linton Ham MD Signed: 05/31/2018 5:22:10 PM By: Gretta Cool, BSN, RN, CWS, Kim RN, BSN Entered By: Gretta Cool, BSN, RN, CWS, Kim on 05/30/2018 10:29:55 Twilley, Zack Seal. (  [id number]) -------------------------------------------------------------------------------- Problem List Details Patient Name: Andrew Lyons, Andrew Lyons Date of Service: 05/30/2018 10:15 AM Medical Record Number: [id number] Patient Account Number: [id number] Date of Birth/Sex: 1946-05-31 (72 y.o. 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 M) Treating RN: Cornell Barman Primary Care Provider: [name], Delfino Lovett Other Clinician: Referring Provider: [name], [name] Treating Provider/Extender: [name] in Treatment: 2 Subjective History of Present Illness (HPI) 01/04/17; this is a 72 year old diabetic man who has a remote history of a traumatic lower extremity damage at age 79 requiring an amputation. He tells me spent 2 years walking on crutches then ultimately has been walking on prosthesis without any trouble since then. Several months ago he had a new prosthesis and developed an open area in the right stump in December. He saw podiatry Dr. Prudence Davidson who noted that this was really out of his practice jurisdiction and referred him here. He has been using mupirocin. He has continued to walk on the prosthesis. The prosthesis itself as been adjusted by the prostatitis. He does not have a known arterial issue. He tells me he probably has diabetic neuropathy. He had a wound that took a long time to heal surrounding the actual amputation itself however is not had more recent wounds. 01/10/17 Culture from last week grew pseudomonas. change AB to cefdinir yesterday from doxy. he is not using his prosthesis which I emphasized although he is having it  adjusted next week 01/17/17; he is completing his antibiotics in the next day or 2. We've been using silver alginate. His prosthesis is away being readjusted. He had an x-ray of the underlying bone here apparently at his podiatrist's office although I'll need to have a look at that and we can't find the result then he may need this re-x-rayed. 01/24/17; he has completed his antibiotics. Change to Silver collagen last week. No real change in the wound this week. 01/31/17 repeat culture I did last week showed a few Pseudomonas. This only has intermediate sensitivity to quinolones I therefore put him back on a week of Cefdinir. X-ray of the stump did not show any bone destruction. We are attempting to get vascular studies done through Dr.Arida's office on May 23. 02/07/17; arterial studies are booked for May 23 no major change. He has completed his antibiotics 02/14/17 arterial studies on Thursday 5/23. using collagen 02/21/17; the patient had his arterial studies. Both his ABIs were good at 1.0 on the right and 1.1 on the left. His abdominal and iliac arteries revealed atherosclerosis without focal stenosis duplex imaging of the right lower leg revealed heterogeneous plaque and biphasic Doppler waveforms from the right CFA to the popliteal artery. The right distal SFA and tibial peroneal trunk velocities were elevated. Overall the patient had  30-49% right distal SFA stenosis. The proximal right ATA and PTA were patent above the amputation level. We have been using Silver collagen with some improvement in the wound dimensions 02/28/17; we went ahead and did TCOM measurements on this patient. One of the leads was nonfunctional however on the right leg both the proximal and periwound leads registered greater than 70 mmHg oxygen tension. Interestingly the comparison lead on the left leg/relative wound level was in the mid 40s. This allays any fears I had about contributing wound ischemia. In the meantime is  measurements today were 0.2 x 0.4 x 0.7 the wound is now a small divot most of it appears to be epithelialized with only a small open area at that depth. There is no evidence of surrounding infection 03/07/17; continued improvement down to 0.3 cm in depth. This is a very tiny circular opening. No drainage no tenderness. Looks like it is been progressing towards closure. He has been using Silver collagen although I think this is too small to use this as a primary dressing. 03/14/17; still 0.3 cm in depth tiny circular wound. No drainage or tenderness. Used Iodosorb ointment starting last week 03/21/17; still the depth and this wound. Using a #3 curet I remove necrotic material from the wound base. There is a divot here. Not making enough improvement on the Iodosorb, I change back to silver collagen this week. 03/28/17; depth down to 0.2. I cannot actually see a surface of this wound. We've been using silver collagen which I will continue 04/04/17 on evaluation today patient appears to be doing very well. In fact his wound appears to be completely healed though there is a slight eschar over the area I see no fluctuance underneath and I am reluctant to attempt scraping on the area due to the very center superficial nature of the eschar. Patient has no discomfort. 04/11/17; the patient went back to [facility] to his prosthetist however his leg was too swollen on the right deficient into the prosthesis. The wound itself on the tip of his stump on the right is closed and was close last week. I see no reason to be Andrew Lyons, Andrew T. ([id number]) concerned about this. READMISSION 10/25/17; this is a patient that we had in clinic from April to July 2018 with a probing tiny open area on his right below sided below-knee amputation site. X-ray of this area did not show osteomyelitis. I had some concern about this being ischemic for a while although we did ABIs and T Coms that did a lot to allay these concerns.  The patient is a type II diabetic although I don't have a recent hemoglobin A1c. He tells me that after he left the clinic last time he got a new prosthesis, not the prosthesis that originally caused wounds in this area. He states about a month ago used noted some dark discoloration in his skin or at least his wife did when he was getting out of the shower. He since has noted increasing pain and tenderness when he walks around this area. He arrives in clinic with no open wound 11/01/17; small probing wound on the medial aspect of the stump from a distal amputation site remotely. Surprisingly culture I did last week showed gram-positive cocci but did not culture. X-ray showed edema but no bony abnormality no soft tissue gas etc. we've been using silver alginate. He is offloading this using a scooter 11/08/17; small probing wound on the medial aspect of his stump from a distal amputation  done remotely. He arrived in here with a small probing hold the did not go to bone. Culture I did of the discharged from here was negative. Nevertheless I gave him a week's worth of Augmentin wondering whether this could've been anaerobic. He is not having any pain versus when he came in here. He does have callus on the medial aspect of the stump and in my mind this represents excess pressure on this area when he walks. The area has completely closed once again READMISSION 05/16/18 This is the third admission to the clinic for this patient who had a very distal below-knee amputation many years ago. She uses a modified prosthesis. We have had problems with recurrent wounds in the medial part of the stump which is a small area originally his distal tibia. We've been able to get this to close on 2 occasions. He tells me that this reopened about 2 months ago. Since then he's been "doctoring on it" himself I think packing it with collagen and covering it with alginate he had left over from previous visits. Previous plain  x-rays of this area have not shown soft tissue or bone issues on the stump.he tells me he went to see vein and vascular in [place] in follow-up of this area although I don't have this in my notes I'll need to research this in Epic. By his description he says that the blood flow on the right was better than the left. He is a poorly controlled diabetic with a recent hemoglobin A1c of 9 last month he has not yet on insulin. On 02/17/17 he had arterial studies. These were generally quite good with an ABI on the right of 1 on the left of 1.1. Duplex imaging with color Doppler of the right lower leg showed heterogeneous plaque and biphasic Doppler waveforms from the right SFA to the popliteal artery. Right distal SFA and tibial peroneal trunk velocities were elevated. He was felt to have a 30- 49% distal SFA stenosis however the right ATA and PTA were felt to be patent above the amputation level. I do not have that he actually saw a vascular surgeon I'll need to research this in Epic E May have actually been referring to the test just quoted 05/23/18; x-ray I did last week was negative for osteomyelitis. Culture the wound that probed to bone last time was negative [but not a bone culture]. We've been using silver alginate packing. This is now the third occasion that this wound has opened in precisely the same area with precisely the same appearance. The patient and his wife are somewhat frustrated by this. They have made all modifications to his prosthetic to offload this area. Nevertheless he develops callus with nonviable tissue underneath and a reopening of this wound. I really do not believe that he has underlying osteomyelitis although at this point I am going to have to rule that out. I've ordered an unenhanced CT scan of the bone underneath his distal amputation. He also has some degree of PAD based on arterial studies done on 02/16/17. At that point duplex imaging suggested that the abdominal aorta  and iliac vessels showed atherosclerosis without stenosis. There was heterogeneous plaque with biphasic Doppler waveforms in the right CFA to the popliteal artery right distal left SFA and tibial peroneal trunk velocities were elevated. I did a TCOM at that time that showed good results. We did get the wound to heal at that time. The wound remained closed for about 6 months only to  reopen recently. 05/30/18; the patient has his consult with Dr. Fletcher Anon on 06/04/18. his CT scan of the bone on the amputation site is apparently booked for 06/14/18. We are going to look over all of this to make sure there isn't a contributing factor of the recurrent breakdown in this area other than the prosthesis and the resultant pressure. This is the third recurrence of the wound and the precisely the same area. On presentation this goes down to bone. We've been using silver alginate packing Andrew Lyons, Andrew T. ([id number]) Objective Constitutional Sitting or standing Blood Pressure is within target range for patient.. Pulse regular and within target range for patient.Marland Kitchen Respirations regular, non-labored and within target range.. Temperature is normal and within the target range for the patient.Marland Kitchen appears in no distress. Vitals Time Taken: 10:10 AM, Height: 70 in, Weight: 264.5 lbs, BMI: 37.9, Temperature: 98.2 F, Pulse: 75 bpm, Respiratory Rate: 16 breaths/min, Blood Pressure: 124/83 mmHg. Cardiovascular popliteal pulse on the right is easily palpable. General Notes: wound exam; once again the wound is debrided of circumferential callous thick skin and subcutaneous tissue. This clearly does not probe to bone which is an improvement. There is no drainage and no obvious infection around the wound area. Integumentary (Hair, Skin) Wound #3 status is Open. Original cause of wound was Pressure Injury. 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 The wound measures 0.3cm length x 0.3cm width x 0.6cm  depth; 0.071cm^2 area and 0.042cm^3 volume. There is Fat Layer (Subcutaneous Tissue) Exposed exposed. There is no tunneling or undermining noted. There is a none present amount of drainage noted. The wound margin is distinct with the outline attached to the wound base. There is no granulation within the wound bed. There is a large (67-100%) amount of necrotic tissue within the wound bed including Eschar and Adherent Slough. The periwound skin appearance exhibited: Callus. The periwound skin appearance did not exhibit: Crepitus, Excoriation, Induration, Rash, Scarring, Dry/Scaly, Maceration, Atrophie Blanche, Cyanosis, Ecchymosis, Hemosiderin Staining, Mottled, Pallor, Rubor, Erythema. Periwound temperature was noted as No Abnormality. The periwound has tenderness on palpation. 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 There was a Excisional Skin/Subcutaneous Tissue Debridement with a total area of 0.09 sq cm performed by [name], MD. With the following instrument(s): Curette Material removed includes Subcutaneous Tissue after achieving pain control using Other (lidocaine 4%). No specimens were taken. A time out was conducted at 10:28, prior to the start of the procedure. A Minimum amount of bleeding was controlled with Pressure. The Andrew Lyons, Andrew Lyons ([id number]) procedure was tolerated well. Patient s Level of Consciousness post procedure was recorded as Awake and Alert. Post Debridement Measurements: 0.3cm length x 0.3cm width x 0.6cm depth; 0.042cm^3 volume. Post debridement Stage noted as  Category/Stage III. Character of Wound/Ulcer Post Debridement is stable. Severity of Tissue Post Debridement is: Fat layer exposed. Post procedure Diagnosis Wound #3: Same as Pre-Procedure Plan Wound Cleansing: Wound #3 Right Amputation Site - Below Knee: Clean wound with Normal Saline. May Shower, gently pat wound dry prior to applying new dressing. Anesthetic (add to Medication List): Wound #3 Right Amputation Site - Below Knee:  Topical Lidocaine 4% cream applied to wound bed prior to debridement (In Clinic Only). Primary Wound Dressing: Wound #3 Right Amputation Site - Below Knee: Silver Alginate Secondary Dressing: Wound #3 Right Amputation Site - Below Knee: Foam Dressing Change Frequency: Wound #3 Right Amputation Site - Below Knee: Change dressing every other day. Follow-up Appointments: Wound #3 Right Amputation Site - Below Knee: Return Appointment in 1 week. Off-Loading: Wound #3 Right Amputation Site - Below Knee: Other: - do not wear your prosthesis #1 we continued with the silver alginate packing. As usual wheezing in the making some improvement with him religiously offloading the stump. #2 we'll image the bone here to make sure there is not underlying infection and have his vascular status reviewed by Dr. Fletcher Anon #3 if we can get this to heal and there isn't an additional reversible factor he may need to consider a stump revision Electronic Signature(s) Signed: 05/30/2018 4:51:46 PM By: Linton Ham MD Entered By: Linton Ham on 05/30/2018 10:59:12 Andrew Lyons ([id number]) -------------------------------------------------------------------------------- SuperBill Details Patient Name: Andrew Lyons Date of Service: 05/30/2018 Medical Record Number: [id number] Patient Account Number: [id number] Date of Birth/Sex: 22-Feb-1946 (72 y.o. 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

## 2018-06-03 NOTE — Progress Notes (Signed)
BLADEN, UMAR ([id number]) Visit Report for 05/30/2018 Arrival Information Details Patient Name: Andrew Lyons, Andrew Lyons Date of Service: 05/30/2018 10:15 AM Medical Record Number: [id number] Patient Account Number: [id number] Date of Birth/Sex: 1946-07-17 (72 y.o. M) Treating RN: Secundino Ginger Primary Care Caroll Weinheimer: [name], Delfino Lovett Other Clinician: Referring Torrie Lafavor: [name] Treating Isobella Ascher/Extender: [name] in Treatment: 2 Visit Information History Since Last Visit Added or deleted any medications: No Patient Arrived: Other Any new allergies or adverse reactions: No Arrival Time: 10:09 Had a fall or experienced change in No Accompanied By: self activities of daily living that may affect Transfer Assistance: None risk of falls: Patient Identification Verified: Yes Signs or symptoms of abuse/neglect since last visito No Secondary Verification Process Completed: Yes Hospitalized since last visit: No Patient Requires Transmission-Based Precautions: No Implantable device outside of the clinic excluding No Patient Has Alerts: Yes cellular tissue based products placed in the center Patient Alerts: DM II since last visit: Pain Present Now: No Electronic Signature(s) Signed: 05/30/2018 2:39:50 PM By: Secundino Ginger Entered By: Secundino Ginger on 05/30/2018 10:11:58 Andrew Lyons ([id number]) -------------------------------------------------------------------------------- Encounter Discharge Information Details Patient Name: Andrew Lyons Date of Service: 05/30/2018 10:15 AM Medical Record Number: [id number] Patient Account Number: [id number] Date of Birth/Sex: Sep 14, 1946 (72 y.o. M) Treating RN: [name] Primary Care Ashrith Sagan: [name], Delfino Lovett Other Clinician: Referring Sheree Lalla: [name], [name] Treating Cyan Clippinger/Extender: [name] in Treatment: 2 Encounter Discharge Information Items Discharge Condition: Stable Ambulatory  Status: Ambulatory Discharge Destination: Home Transportation: Private Auto Accompanied By: self Schedule Follow-up Appointment: Yes Clinical Summary of Care: Electronic Signature(s) Signed: 05/30/2018 2:56:21 PM By: [name] Entered By: [name] on 05/30/2018 14:56:21 Andrew Lyons ([id number]) -------------------------------------------------------------------------------- Lower Extremity Assessment Details Patient Name: Andrew Lyons Date of Service: 05/30/2018 10:15 AM Medical Record Number: [id number] Patient Account Number: [id number] Date of Birth/Sex: 1946/09/24 (72 y.o. M) Treating RN: Secundino Ginger Primary Care Taia Bramlett: [name], Delfino Lovett Other Clinician: Referring Angelynn Lemus: [name], [name] Treating Latesha Chesney/Extender: [name] in Treatment: 2 Electronic Signature(s) Signed: 05/30/2018 2:39:50 PM By: Secundino Ginger Entered By: Secundino Ginger on 05/30/2018 10:21:10 Andrew Lyons ([id number]) -------------------------------------------------------------------------------- Multi Wound Chart Details Patient Name: Andrew Lyons Date of Service: 05/30/2018 10:15 AM Medical Record Number: [id number] Patient Account Number: [id number] Date of Birth/Sex: Nov 15, 1945 (72 y.o. M) Treating RN: Cornell Barman Primary Care Elishua Radford: [name], Delfino Lovett Other Clinician: Referring Chyan Carnero: [name], [name] Treating Masey Scheiber/Extender: [name] in Treatment: 2 Vital Signs Height(in): 70 Pulse(bpm): 75 Weight(lbs): 264.5 Blood Pressure(mmHg): 124/83 Body Mass Index(BMI): 38 Temperature(F): 98.2 Respiratory Rate 16 (breaths/min): Photos: [N/A:N/A] Wound Location: Right Amputation Site - Below N/A N/A Knee Wounding Event: Pressure Injury N/A N/A Primary Etiology: Pressure Ulcer N/A N/A Secondary Etiology: Diabetic Wound/Ulcer of the N/A N/A Lower Extremity Comorbid History: Cataracts, Chronic Obstructive N/A N/A Pulmonary  Disease (COPD), Sleep Apnea, Hypotension, Myocardial Infarction, Type II Diabetes, Osteoarthritis, Neuropathy Date Acquired: 02/24/2018 N/A N/A Weeks of Treatment: 2 N/A N/A Wound Status: Open N/A N/A Measurements L x W x D 0.3x0.3x0.6 N/A N/A (cm) Area (cm) : 0.071 N/A N/A Volume (cm) : 0.042 N/A N/A % Reduction in Area: 85.90% N/A N/A % Reduction in Volume: 86.10% N/A N/A Classification: Category/Stage III N/A N/A Exudate Amount: None Present N/A N/A Wound Margin: Distinct, outline attached N/A N/A Granulation Amount: None Present (0%) N/A N/A Necrotic Amount: Large (67-100%) N/A N/A Necrotic Tissue: Eschar, Adherent Slough N/A N/A Exposed Structures: Fat Layer (  Subcutaneous N/A N/A Tissue) Exposed: YONATAN, GUITRON ([id number]) Epithelialization: None N/A N/A Debridement: Debridement - Excisional N/A N/A Pre-procedure 10:28 N/A N/A Verification/Time Out Taken: Pain Control: Other N/A N/A Tissue Debrided: Subcutaneous N/A N/A Level: Skin/Subcutaneous Tissue N/A N/A Debridement Area (sq cm): 0.09 N/A N/A Instrument: Curette N/A N/A Bleeding: Minimum N/A N/A Hemostasis Achieved: Pressure N/A N/A Debridement Treatment Procedure was tolerated well N/A N/A Response: Post Debridement 0.3x0.3x0.6 N/A N/A Measurements L x W x D (cm) Post Debridement Volume: 0.042 N/A N/A (cm) Post Debridement Stage: Category/Stage III N/A N/A Periwound Skin Texture: Callus: Yes N/A N/A Excoriation: No Induration: No Crepitus: No Rash: No Scarring: No Periwound Skin Moisture: Maceration: No N/A N/A Dry/Scaly: No Periwound Skin Color: Atrophie Blanche: No N/A N/A Cyanosis: No Ecchymosis: No Erythema: No Hemosiderin Staining: No Mottled: No Pallor: No Rubor: No Temperature: No Abnormality N/A N/A Tenderness on Palpation: Yes N/A N/A Wound Preparation: Ulcer Cleansing: N/A N/A Rinsed/Irrigated with Saline Topical Anesthetic Applied: Other: lidocaine 4% Procedures  Performed: Debridement N/A N/A Treatment Notes Electronic Signature(s) Signed: 05/30/2018 4:51:46 PM By: Linton Ham MD Entered By: Linton Ham on 05/30/2018 10:54:01 Andrew Lyons ([id number]) -------------------------------------------------------------------------------- Multi-Disciplinary Care Plan Details Patient Name: Andrew Lyons Date of Service: 05/30/2018 10:15 AM Medical Record Number: [id number] Patient Account Number: [id number] Date of Birth/Sex: 06/04/1946 (72 y.o. M) Treating RN: Cornell Barman Primary Care Quamaine Webb: [name], Delfino Lovett Other Clinician: Referring Mccayla Shimada: [name], [name] Treating Emmalynne Courtney/Extender: [name] in Treatment: 2 Active Inactive ` Abuse / Safety / Falls / Self Care Management Nursing Diagnoses: Impaired physical mobility Goals: Patient will not develop complications from immobility Date Initiated: 05/16/2018 Target Resolution Date: 07/27/2018 Goal Status: Active Interventions: Assess fall risk on admission and as needed Notes: ` Orientation to the Wound Care Program Nursing Diagnoses: Knowledge deficit related to the wound healing center program Goals: Patient/caregiver will verbalize understanding of the [place] Date Initiated: 05/16/2018 Target Resolution Date: 07/27/2018 Goal Status: Active Interventions: Provide education on orientation to the wound center Notes: ` Wound/Skin Impairment Nursing Diagnoses: Impaired tissue integrity Goals: Ulcer/skin breakdown will heal within 14 weeks Date Initiated: 05/16/2018 Target Resolution Date: 07/27/2018 Goal Status: Active Interventions: ABDO, DENAULT ([id number]) Assess patient/caregiver ability to obtain necessary supplies Assess patient/caregiver ability to perform ulcer/skin care regimen upon admission and as needed Assess ulceration(s) every visit Notes: Electronic Signature(s) Signed: 05/31/2018 5:22:10 PM By: Gretta Cool,  BSN, RN, CWS, Kim RN, BSN Entered By: Gretta Cool, BSN, RN, CWS, Kim on 05/30/2018 10:26:09 Andrew Lyons ([id number]) -------------------------------------------------------------------------------- Pain Assessment Details Patient Name: Andrew Lyons Date of Service: 05/30/2018 10:15 AM Medical Record Number: [id number] Patient Account Number: [id number] Date of Birth/Sex: 10-Aug-1946 (72 y.o. M) Treating RN: Secundino Ginger Primary Care Chanese Hartsough: [name] Other Clinician: Referring Dravin Lance: [name] Treating Domenique Southers/Extender: [name] in Treatment: 2 Active Problems Location of Pain Severity and Description of Pain Patient Has Paino No Site Locations Pain Management and Medication Current Pain Management: Goals for Pain Management pt denies any pain at this time. Electronic Signature(s) Signed: 05/30/2018 2:39:50 PM By: Secundino Ginger Entered By: Secundino Ginger on 05/30/2018 10:12:20 Andrew Lyons ([id number]) -------------------------------------------------------------------------------- Patient/Caregiver Education Details Patient Name: Andrew Lyons Date of Service: 05/30/2018 10:15 AM Medical Record Number: [id number] Patient Account Number: [id number] Date of Birth/Gender: 29-Oct-1945 (72 y.o. M) Treating RN: [name] Primary Care Physician: [name], Delfino Lovett Other Clinician: Referring Physician: [name], [name] Treating Physician/Extender: [name]  in Treatment: 2 Education Assessment Education Provided To: Patient Education Topics Provided Wound/Skin Impairment: Handouts: Other: wound care as ordered Methods: Demonstration, Explain/Verbal Responses: State content correctly Electronic Signature(s) Signed: 05/30/2018 4:57:10 PM By: [name] Entered By: [name] on 05/30/2018 14:56:36 Andrew Lyons  ([id number]) -------------------------------------------------------------------------------- Wound Assessment Details Patient Name: Andrew Lyons Date of Service: 05/30/2018 10:15 AM Medical Record Number: [id number] Patient Account Number: [id number] Date of Birth/Sex: 23-Jun-1946 (72 y.o. M) Treating RN: Secundino Ginger Primary Care Caswell Alvillar: [name], Delfino Lovett Other Clinician: Referring Janeece Blok: [name] Treating Reika Callanan/Extender: [name] in Treatment: 2 Wound Status Wound Number: 3 Primary Pressure Ulcer Etiology: Wound Location: Right Amputation Site - Below Knee Secondary Diabetic Wound/Ulcer of the Lower Extremity Wounding Event: Pressure Injury Etiology: Date Acquired: 02/24/2018 Wound Open Weeks Of Treatment: 2 Status: Clustered Wound: No Comorbid Cataracts, Chronic Obstructive Pulmonary History: Disease (COPD), Sleep Apnea, Hypotension, Myocardial Infarction, Type II Diabetes, Osteoarthritis, Neuropathy Photos Photo Uploaded By: Secundino Ginger on 05/30/2018 10:26:57 Wound Measurements Length: (cm) 0.3 Width: (cm) 0.3 Depth: (cm) 0.6 Area: (cm) 0.071 Volume: (cm) 0.042 % Reduction in Area: 85.9% % Reduction in Volume: 86.1% Epithelialization: None Tunneling: No Undermining: No Wound Description Classification: Category/Stage III Wound Margin: Distinct, outline attached Exudate Amount: None Present Foul Odor After Cleansing: No Slough/Fibrino No Wound Bed Granulation Amount: None Present (0%) Exposed Structure Necrotic Amount: Large (67-100%) Fat Layer (Subcutaneous Tissue) Exposed: Yes Necrotic Quality: Eschar, Adherent Slough Periwound Skin Texture Texture Color No Abnormalities Noted: No No Abnormalities Noted: No Callus: Yes Atrophie Blanche: No DEANDREW, HOECKER ([id number]) Crepitus: No Cyanosis: No Excoriation: No Ecchymosis: No Induration: No Erythema: No Rash: No Hemosiderin Staining: No Scarring: No Mottled:  No Pallor: No Moisture Rubor: No No Abnormalities Noted: No Dry / Scaly: No Temperature / Pain Maceration: No Temperature: No Abnormality Tenderness on Palpation: Yes Wound Preparation Ulcer Cleansing: Rinsed/Irrigated with Saline Topical Anesthetic Applied: Other: lidocaine 4%, Treatment Notes Wound #3 (Right Amputation Site - Below Knee) 1. Cleansed with: Clean wound with Normal Saline 2. Anesthetic Topical Lidocaine 4% cream to wound bed prior to debridement 3. Peri-wound Care: Skin Prep 4. Dressing Applied: Calcium Alginate with Silver 5. Secondary Dressing Applied Bordered Foam Dressing Notes silvercel packed into wound and cover wound Electronic Signature(s) Signed: 05/30/2018 2:39:50 PM By: Secundino Ginger Entered By: Secundino Ginger on 05/30/2018 10:21:01 Andrew Lyons ([id number]) -------------------------------------------------------------------------------- [place] Details Patient Name: Andrew Lyons Date of Service: 05/30/2018 10:15 AM Medical Record Number: [id number] Patient Account Number: [id number] Date of Birth/Sex: 1946-08-30 (72 y.o. M) Treating RN: Secundino Ginger Primary Care Jamyson Jirak: [name], Delfino Lovett Other Clinician: Referring Reza Crymes: [name] Treating Valmai Vandenberghe/Extender: [name] in Treatment: 2 Vital Signs Time Taken: 10:10 Temperature (F): 98.2 Height (in): 70 Pulse (bpm): 75 Weight (lbs): 264.5 Respiratory Rate (breaths/min): 16 Body Mass Index (BMI): 37.9 Blood Pressure (mmHg): 124/83 Reference Range: 80 - 120 mg / dl Electronic Signature(s) Signed: 05/30/2018 2:39:50 PM By: Secundino Ginger Entered By: Secundino Ginger on 05/30/2018 10:15:23

## 2018-06-04 ENCOUNTER — Ambulatory Visit (INDEPENDENT_AMBULATORY_CARE_PROVIDER_SITE_OTHER): Payer: PPO

## 2018-06-04 DIAGNOSIS — S81801S Unspecified open wound, right lower leg, sequela: Secondary | ICD-10-CM | POA: Diagnosis not present

## 2018-06-04 DIAGNOSIS — L97919 Non-pressure chronic ulcer of unspecified part of right lower leg with unspecified severity: Secondary | ICD-10-CM | POA: Diagnosis not present

## 2018-06-05 ENCOUNTER — Ambulatory Visit (INDEPENDENT_AMBULATORY_CARE_PROVIDER_SITE_OTHER): Payer: PPO | Admitting: Cardiovascular Disease

## 2018-06-05 ENCOUNTER — Encounter: Payer: Self-pay | Admitting: Cardiovascular Disease

## 2018-06-05 VITALS — BP 138/74 | HR 68 | Ht 70.0 in | Wt 255.5 lb

## 2018-06-05 DIAGNOSIS — E785 Hyperlipidemia, unspecified: Secondary | ICD-10-CM | POA: Diagnosis not present

## 2018-06-05 DIAGNOSIS — Z01818 Encounter for other preprocedural examination: Secondary | ICD-10-CM

## 2018-06-05 DIAGNOSIS — I739 Peripheral vascular disease, unspecified: Secondary | ICD-10-CM

## 2018-06-05 DIAGNOSIS — I2510 Atherosclerotic heart disease of native coronary artery without angina pectoris: Secondary | ICD-10-CM

## 2018-06-05 MED ORDER — PREDNISONE 50 MG PO TABS: ORAL_TABLET | ORAL | 0 refills | Status: DC

## 2018-06-05 MED ORDER — DIPHENHYDRAMINE HCL 50 MG PO TABS: ORAL_TABLET | ORAL | 0 refills | Status: DC
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
## 2018-06-05 NOTE — Patient Instructions (Addendum)
  Medication Instructions: Your physician recommends that you continue on your current medications as directed. Please refer to the Current Medication list given to you today.  If you need a refill on your cardiac medications before your next appointment, please call your pharmacy.   Follow-Up: Your physician wants you to follow-up in one month with Dr. Fletcher Anon.   Thank you for choosing Heartcare at [facility]!     [address], [place] [address] Dept: [phone] Loc: [place]  06/05/2018  You are scheduled for a Peripheral Angiogram on Wednesday, September 25 with Dr. Kathlyn Sacramento.  1. Please arrive at the [facility] (Main Entrance A) at [facility]: [address] [place], [place] [address] at 6:30 AM (This time is two hours before your procedure to ensure your preparation). Free valet parking service is available.   Special note: Every effort is made to have your procedure done on time. Please understand that emergencies sometimes delay scheduled procedures.  2. Diet: Do not eat solid foods after midnight.  The patient may have clear liquids until 5am upon the day of the procedure.  3. Labs: You will need to have labs drawn today at the [facility]  4. Medication instructions in preparation for your procedure: Please hold your Metformin 24 hours prior and 48 hours after the procedure Hold all diabetic medications the morning of the procedure.    Contrast Allergy: Please take Prednisone 50mg  by mouth at: Thirteen hours prior to cath, seven hours prior to cath, and prior to leaving home please take last dose of Prednisone 50mg  and Benadryl 50mg  by mouth.  On the morning of your procedure, take your Aspirin and any morning medicines NOT listed above.  You may use sips of water.  5. Plan for one night stay--bring personal  belongings. 6. Bring a current list of your medications and current insurance cards. 7. You MUST have a responsible person to drive you home. 8. Someone MUST be with you the first 24 hours after you arrive home or your discharge will be delayed. 9. Please wear clothes that are easy to get on and off and wear slip-on shoes.  Thank you for allowing [place] to care for you!   -- [place] [facility]
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
## 2018-06-06 ENCOUNTER — Encounter: Payer: PPO | Admitting: Internal Medicine

## 2018-06-06 ENCOUNTER — Other Ambulatory Visit
Admission: RE | Admit: 2018-06-06 | Discharge: 2018-06-06 | Disposition: A | Discharge status: Home / Self Care | Payer: PPO | Source: Ambulatory Visit | Attending: Cardiovascular Disease | Admitting: Cardiovascular Disease

## 2018-06-06 DIAGNOSIS — Z01818 Encounter for other preprocedural examination: Secondary | ICD-10-CM | POA: Diagnosis not present

## 2018-06-06 DIAGNOSIS — I739 Peripheral vascular disease, unspecified: Secondary | ICD-10-CM | POA: Diagnosis not present

## 2018-06-06 DIAGNOSIS — L89893 Pressure ulcer of other site, stage 3: Secondary | ICD-10-CM | POA: Diagnosis not present

## 2018-06-06 DIAGNOSIS — N401 Enlarged prostate with lower urinary tract symptoms: Secondary | ICD-10-CM | POA: Diagnosis not present

## 2018-06-06 DIAGNOSIS — E11622 Type 2 diabetes mellitus with other skin ulcer: Secondary | ICD-10-CM | POA: Diagnosis not present

## 2018-06-06 LAB — BASIC METABOLIC PANEL
ANION GAP: 10 (ref 5–15)
BUN: 19 mg/dL (ref 8–23)
CO2: 27 mmol/L (ref 22–32)
CREATININE: 1.19 mg/dL (ref 0.61–1.24)
Calcium: 9.1 mg/dL (ref 8.9–10.3)
Chloride: 103 mmol/L (ref 98–111)
GFR, EST NON AFRICAN AMERICAN: 60 mL/min — AB (ref >60)
GLUCOSE: 133 mg/dL — AB (ref 70–99)
Potassium: 4.2 mmol/L (ref 3.5–5.1)
Sodium: 140 mmol/L (ref 135–145)

## 2018-06-06 LAB — CBC
HEMATOCRIT: 41.9 % (ref 40.0–52.0)
Hemoglobin: 14.2 g/dL (ref 13.0–18.0)
MCH: 31 pg (ref 26.0–34.0)
MCHC: 33.9 g/dL (ref 32.0–36.0)
MCV: 91.4 fL (ref 80.0–100.0)
Platelets: 177 10*3/uL (ref 150–440)
RBC: 4.58 MIL/uL (ref 4.40–5.90)
RDW: 15.4 % — AB (ref 11.5–14.5)
WBC: 5.5 10*3/uL (ref 3.8–10.6)

## 2018-06-06 NOTE — Addendum Note (Signed)
Addended by: Alba Destine on: 06/06/2018 01:27 PM   Modules accepted: Orders

## 2018-06-08 NOTE — Progress Notes (Signed)
Andrew Lyons ([id number]) Visit Report for 06/06/2018 Debridement Details Patient Name: Andrew Lyons Date of Service: 06/06/2018 10:15 AM Medical Record Number: [id number] Patient Account Number: [id number] Date of Birth/Sex: 30-Mar-1946 (72 y.o. M) Treating RN: Roger Shelter Primary Care Provider: [name], Delfino Lovett Other Clinician: Referring Provider: [name] Treating Provider/Extender: [name] in Treatment: 3 Debridement Performed for Wound #3 Right Amputation Site - Below Knee Assessment: Performed By: Physician [name], MD Debridement Type: Debridement Severity of Tissue Pre Fat layer exposed Debridement: Pre-procedure Verification/Time Yes - 10:29 Out Taken: Start Time: 10:29 Pain Control: Other : lidocaine 4% Total Area Debrided (L x W): 0.2 (cm) x 0.1 (cm) = 0.02 (cm) Tissue and other material Viable, Non-Viable, Slough, Subcutaneous, Biofilm, Slough debrided: Level: Skin/Subcutaneous Tissue Debridement Description: Excisional Instrument: Curette Bleeding: Minimum Hemostasis Achieved: Pressure End Time: 10:30 Procedural Pain: 0 Post Procedural Pain: 0 Response to Treatment: Procedure was tolerated well Level of Consciousness: Awake and Alert Post Debridement Measurements of Total Wound Length: (cm) 0.2 Stage: Category/Stage III Width: (cm) 0.2 Depth: (cm) 0.5 Volume: (cm) 0.016 Character of Wound/Ulcer Post Stable Debridement: Severity of Tissue Post Debridement: Fat layer exposed Post Procedure Diagnosis Same as Pre-procedure Electronic Signature(s) Signed: 06/06/2018 5:03:38 PM By: Roger Shelter Signed: 06/06/2018 5:30:50 PM By: Linton Ham MD Entered By: Linton Ham on 06/06/2018 10:39:47 Andrew Lyons ([id number]) -------------------------------------------------------------------------------- HPI Details Patient Name: Andrew Lyons Date of Service: 06/06/2018 10:15  AM Medical Record Number: [id number] Patient Account Number: [id number] Date of Birth/Sex: Oct 19, 1945 (72 y.o. M) Treating RN: Roger Shelter Primary Care Provider: [name], Delfino Lovett Other Clinician: Referring Provider: [name], [name] Treating Provider/Extender: [name] in Treatment: 3 History of Present Illness HPI Description: 01/04/17; this is a 72 year old diabetic man who has a remote history of a traumatic lower extremity damage at age 41 requiring an amputation. He tells me spent 2 years walking on crutches then ultimately has been walking on prosthesis without any trouble since then. Several months ago he had a new prosthesis and developed an open area in the right stump in December. He saw podiatry Dr. Prudence Davidson who noted that this was really out of his practice jurisdiction and referred him here. He has been using mupirocin. He has continued to walk on the prosthesis. The prosthesis itself as been adjusted by the prostatitis. He does not have a known arterial issue. He tells me he probably has diabetic neuropathy. He had a wound that took a long time to heal surrounding the actual amputation itself however is not had more recent wounds. 01/10/17 Culture from last week grew pseudomonas. change AB to cefdinir yesterday from doxy. he is not using his prosthesis which I emphasized although he is having it adjusted next week 01/17/17; he is completing his antibiotics in the next day or 2. We've been using silver alginate. His prosthesis is away being readjusted. He had an x-ray of the underlying bone here apparently at his podiatrist's office although I'll need to have a look at that and we can't find the result then he may need this re-x-rayed. 01/24/17; he has completed his antibiotics. Change to Silver collagen last week. No real change in the wound this week. 01/31/17 repeat culture I did last week showed a few Pseudomonas. This only has intermediate sensitivity to  quinolones I therefore put him back on a week of Cefdinir. X-ray of the stump did not show any bone destruction. We are attempting to get vascular studies  done through Dr.Arida's office on May 23. 02/07/17; arterial studies are booked for May 23 no major change. He has completed his antibiotics 02/14/17 arterial studies on Thursday 5/23. using collagen 02/21/17; the patient had his arterial studies. Both his ABIs were good at 1.0 on the right and 1.1 on the left. His abdominal and iliac arteries revealed atherosclerosis without focal stenosis duplex imaging of the right lower leg revealed heterogeneous plaque and biphasic Doppler waveforms from the right CFA to the popliteal artery. The right distal SFA and tibial peroneal trunk velocities were elevated. 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 The proximal right ATA and PTA were patent above the amputation level. We have been using Silver collagen with some improvement in the wound dimensions 02/28/17; we went ahead and did TCOM measurements on this patient. One of the leads was nonfunctional however on the right leg both the proximal and periwound leads registered greater than 70 mmHg oxygen tension. Interestingly the comparison lead on the left leg/relative wound level was in the mid 40s. This allays any fears I had about contributing wound ischemia. In the meantime is measurements today were 0.2 x 0.4 x 0.7 the wound is now a small divot most of it appears to be epithelialized with only a small open area at that depth. There is no evidence of surrounding infection 03/07/17; continued improvement down to 0.3 cm in depth. This is a very tiny circular opening. No drainage no tenderness. Looks like it is been progressing towards closure. He has been using Silver collagen although I think this is too small to use this as a primary dressing. 03/14/17; still 0.3 cm in depth tiny circular wound. No drainage or tenderness. Used Iodosorb  ointment starting last week 03/21/17; still the depth and this wound. Using a #3 curet I remove necrotic material from the wound base. There is a divot here. Not making enough improvement on the Iodosorb, I change back to silver collagen this week. 03/28/17; depth down to 0.2. I cannot actually see a surface of this wound. We've been using silver collagen which I will continue 04/04/17 on evaluation today patient appears to be doing very well. In fact his wound appears to be completely healed though there is a slight eschar over the area I see no fluctuance underneath and I am reluctant to attempt scraping on the area due to the very center superficial nature of the eschar. Patient has no discomfort. 04/11/17; the patient went back to [facility] to his prosthetist however his leg was too swollen on the right deficient into the prosthesis. The wound itself on the tip of his stump on the right is closed and was close last week. I see no reason to be concerned about this. AZION, CENTRELLA ([id number]) READMISSION 10/25/17; this is a patient that we had in clinic from April to July 2018 with a probing tiny open area on his right below sided below-knee amputation site. X-ray of this area did not show osteomyelitis. I had some concern about this being ischemic for a while although we did ABIs and T Coms that did a lot to allay these concerns. The patient is a type II diabetic although I don't have a recent hemoglobin A1c. He tells me that after he left the clinic last time he got a new prosthesis, not the prosthesis that originally caused wounds in this area. He states about a month ago used noted some dark discoloration in his skin or at least his wife did  when he was getting out of the shower. He since has noted increasing pain and tenderness when he walks around this area. He arrives in clinic with no open wound 11/01/17; small probing wound on the medial aspect of the stump from a distal amputation  site remotely. Surprisingly culture I did last week showed gram-positive cocci but did not culture. X-ray showed edema but no bony abnormality no soft tissue gas etc. we've been using silver alginate. He is offloading this using a scooter 11/08/17; small probing wound on the medial aspect of his stump from a distal amputation done remotely. He arrived in here with a small probing hold the did not go to bone. Culture I did of the discharged from here was negative. Nevertheless I gave him a week's worth of Augmentin wondering whether this could've been anaerobic. He is not having any pain versus when he came in here. He does have callus on the medial aspect of the stump and in my mind this represents excess pressure on this area when he walks. The area has completely closed once again READMISSION 05/16/18 This is the third admission to the clinic for this patient who had a very distal below-knee amputation many years ago. She uses a modified prosthesis. We have had problems with recurrent wounds in the medial part of the stump which is a small area originally his distal tibia. We've been able to get this to close on 2 occasions. He tells me that this reopened about 2 months ago. Since then he's been "doctoring on it" himself I think packing it with collagen and covering it with alginate he had left over from previous visits. Previous plain x-rays of this area have not shown soft tissue or bone issues on the stump.he tells me he went to see vein and vascular in [place] in follow-up of this area although I don't have this in my notes I'll need to research this in Epic. By his description he says that the blood flow on the right was better than the left. He is a poorly controlled diabetic with a recent hemoglobin A1c of 9 last month he has not yet on insulin. On 02/17/17 he had arterial studies. These were generally quite good with an ABI on the right of 1 on the left of 1.1. Duplex imaging with color  Doppler of the right lower leg showed heterogeneous plaque and biphasic Doppler waveforms from the right SFA to the popliteal artery. Right distal SFA and tibial peroneal trunk velocities were elevated. He was felt to have a 30- 49% distal SFA stenosis however the right ATA and PTA were felt to be patent above the amputation level. I do not have that he actually saw a vascular surgeon I'll need to research this in Epic E May have actually been referring to the test just quoted 05/23/18; x-ray I did last week was negative for osteomyelitis. Culture the wound that probed to bone last time was negative [but not a bone culture]. We've been using silver alginate packing. This is now the third occasion that this wound has opened in precisely the same area with precisely the same appearance. The patient and his wife are somewhat frustrated by this. They have made all modifications to his prosthetic to offload this area. Nevertheless he develops callus with nonviable tissue underneath and a reopening of this wound. I really do not believe that he has underlying osteomyelitis although at this point I am going to have to rule that out. I've ordered an  unenhanced CT scan of the bone underneath his distal amputation. He also has some degree of PAD based on arterial studies done on 02/16/17. At that point duplex imaging suggested that the abdominal aorta and iliac vessels showed atherosclerosis without stenosis. There was heterogeneous plaque with biphasic Doppler waveforms in the right CFA to the popliteal artery right distal left SFA and tibial peroneal trunk velocities were elevated. I did a TCOM at that time that showed good results. We did get the wound to heal at that time. The wound remained closed for about 6 months only to reopen recently. 05/30/18; the patient has his consult with Dr. Fletcher Anon on 06/04/18. his CT scan of the bone on the amputation site is apparently booked for 06/14/18. We are going to look over  all of this to make sure there isn't a contributing factor of the recurrent breakdown in this area other than the prosthesis and the resultant pressure. This is the third recurrence of the wound and the precisely the same area. On presentation this goes down to bone. We've been using silver alginate packing 06/06/18; the patient is going for an angiogram on September 25 by Dr. Fletcher Anon. He has a CT scan on September 19. Using silver alginate. Depth of the wound is down to 0.5 cm Electronic Signature(s) Signed: 06/06/2018 5:30:50 PM By: Linton Ham MD Entered By: Linton Ham on 06/06/2018 10:41:17 SHAHID, FLORI ([id number]) OBRIEN, HUSKINS ([id number]) -------------------------------------------------------------------------------- Physical Exam Details Patient Name: Andrew Lyons Date of Service: 06/06/2018 10:15 AM Medical Record Number: [id number] Patient Account Number: [id number] Date of Birth/Sex: Nov 05, 1945 (72 y.o. M) Treating RN: Roger Shelter Primary Care Provider: [name], Delfino Lovett Other Clinician: Referring Provider: [name], [name] Treating Provider/Extender: [name] in Treatment: 3 Constitutional Sitting or standing Blood Pressure is within target range for patient.. Pulse regular and within target range for patient.Marland Kitchen Respirations regular, non-labored and within target range.. Temperature is normal and within the target range for the patient.Marland Kitchen appears in no distress. Notes wound exam; once again the wound is debrided of circumferential callous skin subcutaneous tissue and some debris from the wound surface. This is done with a #3 curet. Hemostasis with direct pressure. Once again we are able to get to what appears to be a healthy-looking wound surface and the dimensions of the wound are better Electronic Signature(s) Signed: 06/06/2018 5:30:50 PM By: Linton Ham MD Entered By: Linton Ham on 06/06/2018 10:42:25 Andrew Lyons ([id number]) -------------------------------------------------------------------------------- Physician Orders Details Patient Name: Andrew Lyons Date of Service: 06/06/2018 10:15 AM Medical Record Number: [id number] Patient Account Number: [id number] Date of Birth/Sex: 1946/04/29 (72 y.o. M) Treating RN: Roger Shelter Primary Care Provider: [name], Delfino Lovett Other Clinician: Referring Provider: [name] Treating Provider/Extender: [name] in Treatment: 3 Verbal / Phone Orders: No Diagnosis Coding Wound Cleansing Wound #3 Right Amputation Site - Below Knee o Clean wound with Normal Saline. o May Shower, gently pat wound dry prior to applying new dressing. Anesthetic (add to Medication List) Wound #3 Right Amputation Site - Below Knee o Topical Lidocaine 4% cream applied to wound bed prior to debridement (In Clinic Only). Primary Wound Dressing Wound #3 Right Amputation Site - Below Knee o Silver Alginate Secondary Dressing Wound #3 Right Amputation Site - Below Knee o Boardered Foam Dressing Dressing Change Frequency Wound #3 Right Amputation Site - Below Knee o Change dressing every other day. Follow-up Appointments Wound #3 Right Amputation Site - Below Knee o Return Appointment  in 1 week. Off-Loading Wound #3 Right Amputation Site - Below Knee o Other: - do not wear your prosthesis Electronic Signature(s) Signed: 06/06/2018 5:03:38 PM By: Roger Shelter Signed: 06/06/2018 5:30:50 PM By: Linton Ham MD Entered By: Roger Shelter on 06/06/2018 10:31:32 SAMUELL, KNOBLE ([id number]) -------------------------------------------------------------------------------- Problem List Details Patient Name: Andrew Lyons Date of Service: 06/06/2018 10:15 AM Medical Record Number: [id number] Patient Account Number: [id number] Date of Birth/Sex: [birth year] (72 y.o. 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 M) Treating RN: Roger Shelter Primary Care Provider: [name], Delfino Lovett Other Clinician: Referring Provider: [name], [name] Treating Provider/Extender: [name] in Treatment: 3 Subjective History of Present Illness (HPI) 01/04/17; this is a 72 year old diabetic man who has a remote history of a traumatic lower extremity damage at age 49 requiring an amputation. He tells me spent 2 years walking on crutches then ultimately has been walking on prosthesis without any trouble since then. Several months ago he had a new prosthesis and developed an open area in the right stump in December. He saw podiatry Dr. Prudence Davidson who noted that this was really out of his practice jurisdiction and referred him here. He has been using mupirocin. He has continued to walk on the prosthesis. The prosthesis itself as been adjusted by the prostatitis. He does not have a known arterial  issue. He tells me he probably has diabetic neuropathy. He had a wound that took a long time to heal surrounding the actual amputation itself however is not had more recent wounds. 01/10/17 Culture from last week grew pseudomonas. change AB to cefdinir yesterday from doxy. he is not using his prosthesis which I emphasized although he is having it adjusted next week 01/17/17; he is completing his antibiotics in the next day or 2. We've been using silver alginate. His prosthesis is away being readjusted. He had an x-ray of the underlying bone here apparently at his podiatrist's office although I'll need to have a look at that and we can't find the result then he may need this re-x-rayed. 01/24/17; he has completed his antibiotics. Change to Silver collagen last week. No real change in the wound this week. 01/31/17 repeat culture I did last week showed a few Pseudomonas. This only has intermediate sensitivity to quinolones I therefore put him back on a week of Cefdinir. X-ray of the stump did not show any bone destruction. We are attempting to get vascular studies done through Dr.Arida's office on May 23. 02/07/17; arterial studies are booked for May 23 no major change. He has completed his antibiotics 02/14/17 arterial studies on Thursday 5/23. using collagen 02/21/17; the patient had his arterial studies. Both his ABIs were good at 1.0 on the right and 1.1 on the left.  His abdominal and iliac arteries revealed atherosclerosis without focal stenosis duplex imaging of the right lower leg revealed heterogeneous plaque and biphasic Doppler waveforms from the right CFA to the popliteal artery. The right distal SFA and tibial peroneal trunk velocities were elevated. 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 The proximal right ATA and PTA were patent above the amputation level. We have been using Silver collagen with some improvement in the wound dimensions 02/28/17; we went ahead and did TCOM  measurements on this patient. One of the leads was nonfunctional however on the right leg both the proximal and periwound leads registered greater than 70 mmHg oxygen tension. Interestingly the comparison lead on the left leg/relative wound level was in the mid 40s. This allays any fears I had about contributing wound ischemia. In the meantime is measurements today were 0.2 x 0.4 x 0.7 the wound is now a small divot most of it appears to be epithelialized with only a small open area at that depth. There is no evidence of surrounding infection 03/07/17; continued improvement down to 0.3 cm in depth. This is a very tiny circular opening. No drainage no tenderness. Looks like it is been progressing towards closure. He has been using Silver collagen although I think this is too small to use this as a primary dressing. 03/14/17; still 0.3 cm in depth tiny circular wound. No drainage or tenderness. Used Iodosorb ointment starting last week 03/21/17; still the depth and this wound. Using a #3 curet I remove necrotic material from the wound base. There is a divot here. Not making enough improvement on the Iodosorb, I change back to silver collagen this week. 03/28/17; depth down to 0.2. I cannot actually see a surface of this wound. We've been using silver collagen which I will continue 04/04/17 on evaluation today patient appears to be doing very well. In fact his wound appears to be completely healed though there is a slight eschar over the area I see no fluctuance underneath and I am reluctant to attempt scraping on the area due to the very center superficial nature of the eschar. Patient has no discomfort. 04/11/17; the patient went back to [facility] to his prosthetist however his leg was too swollen on the right deficient into the prosthesis. The wound itself on the tip of his stump on the right is closed and was close last week. I see no reason to be OCIEL, RETHERFORD T. ([id number]) concerned about  this. READMISSION 10/25/17; this is a patient that we had in clinic from April to July 2018 with a probing tiny open area on his right below sided below-knee amputation site. X-ray of this area did not show osteomyelitis. I had some concern about this being ischemic for a while although we did ABIs and T Coms that did a lot to allay these concerns. The patient is a type II diabetic although I don't have a recent hemoglobin A1c. He tells me that after he left the clinic last time he got a new prosthesis, not the prosthesis that originally caused wounds in this area. He states about a month ago used noted some dark discoloration in his skin or at least his wife did when he was getting out of the shower. He since has noted increasing pain and tenderness when he walks around this area. He arrives in clinic with no open wound 11/01/17; small probing wound on the medial aspect of the stump from a distal amputation site remotely. Surprisingly culture I did last  week showed gram-positive cocci but did not culture. X-ray showed edema but no bony abnormality no soft tissue gas etc. we've been using silver alginate. He is offloading this using a scooter 11/08/17; small probing wound on the medial aspect of his stump from a distal amputation done remotely. He arrived in here with a small probing hold the did not go to bone. Culture I did of the discharged from here was negative. Nevertheless I gave him a week's worth of Augmentin wondering whether this could've been anaerobic. He is not having any pain versus when he came in here. He does have callus on the medial aspect of the stump and in my mind this represents excess pressure on this area when he walks. The area has completely closed once again READMISSION 05/16/18 This is the third admission to the clinic for this patient who had a very distal below-knee amputation many years ago. She uses a modified prosthesis. We have had problems with recurrent wounds in  the medial part of the stump which is a small area originally his distal tibia. We've been able to get this to close on 2 occasions. He tells me that this reopened about 2 months ago. Since then he's been "doctoring on it" himself I think packing it with collagen and covering it with alginate he had left over from previous visits. Previous plain x-rays of this area have not shown soft tissue or bone issues on the stump.he tells me he went to see vein and vascular in [place] in follow-up of this area although I don't have this in my notes I'll need to research this in Epic. By his description he says that the blood flow on the right was better than the left. He is a poorly controlled diabetic with a recent hemoglobin A1c of 9 last month he has not yet on insulin. On 02/17/17 he had arterial studies. These were generally quite good with an ABI on the right of 1 on the left of 1.1. Duplex imaging with color Doppler of the right lower leg showed heterogeneous plaque and biphasic Doppler waveforms from the right SFA to the popliteal artery. Right distal SFA and tibial peroneal trunk velocities were elevated. He was felt to have a 30- 49% distal SFA stenosis however the right ATA and PTA were felt to be patent above the amputation level. I do not have that he actually saw a vascular surgeon I'll need to research this in Epic E May have actually been referring to the test just quoted 05/23/18; x-ray I did last week was negative for osteomyelitis. Culture the wound that probed to bone last time was negative [but not a bone culture]. We've been using silver alginate packing. This is now the third occasion that this wound has opened in precisely the same area with precisely the same appearance. The patient and his wife are somewhat frustrated by this. They have made all modifications to his prosthetic to offload this area. Nevertheless he develops callus with nonviable tissue underneath and a reopening of  this wound. I really do not believe that he has underlying osteomyelitis although at this point I am going to have to rule that out. I've ordered an unenhanced CT scan of the bone underneath his distal amputation. He also has some degree of PAD based on arterial studies done on 02/16/17. At that point duplex imaging suggested that the abdominal aorta and iliac vessels showed atherosclerosis without stenosis. There was heterogeneous plaque with biphasic Doppler waveforms in the right  CFA to the popliteal artery right distal left SFA and tibial peroneal trunk velocities were elevated. I did a TCOM at that time that showed good results. We did get the wound to heal at that time. The wound remained closed for about 6 months only to reopen recently. 05/30/18; the patient has his consult with Dr. Fletcher Anon on 06/04/18. his CT scan of the bone on the amputation site is apparently booked for 06/14/18. We are going to look over all of this to make sure there isn't a contributing factor of the recurrent breakdown in this area other than the prosthesis and the resultant pressure. This is the third recurrence of the wound and the precisely the same area. On presentation this goes down to bone. We've been using silver alginate packing 06/06/18; the patient is going for an angiogram on September 25 by Dr. Fletcher Anon. He has a CT scan on September 19. Using silver alginate. Depth of the wound is down to 0.5 cm KYRO, JOSWICK T. ([id number]) Objective Constitutional Sitting or standing Blood Pressure is within target range for patient.. Pulse regular and within target range for patient.Marland Kitchen Respirations regular, non-labored and within target range.. Temperature is normal and within the target range for the patient.Marland Kitchen appears in no distress. Vitals Time Taken: 10:12 AM, Height: 70 in, Weight: 264.5 lbs, BMI: 37.9, Temperature: 98.1 F, Pulse: 69 bpm, Respiratory Rate: 16 breaths/min, Blood Pressure: 137/78 mmHg. General Notes:  wound exam; once again the wound is debrided of circumferential callous skin subcutaneous tissue and some debris from the wound surface. This is done with a #3 curet. Hemostasis with direct pressure. Once again we are able to get to what appears to be a healthy-looking wound surface and the dimensions of the wound are better Integumentary (Hair, Skin) Wound #3 status is Open. Original cause of wound was Pressure Injury. 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 The wound measures 0.2cm length x 0.1cm width x 0.4cm depth; 0.016cm^2 area and 0.006cm^3 volume. There is Fat Layer (Subcutaneous Tissue) Exposed exposed. There is no tunneling or undermining noted. There is a none present amount of drainage noted. The wound margin is distinct with the outline attached to the wound base. There is no granulation within the wound bed. There is a large (67-100%) amount of necrotic tissue within the wound bed including Eschar. The periwound skin appearance exhibited: Callus. The periwound skin appearance did not exhibit: Crepitus, Excoriation, Induration, Rash, Scarring, Dry/Scaly, Maceration, Atrophie Blanche, Cyanosis, Ecchymosis, Hemosiderin Staining, Mottled, Pallor, Rubor, Erythema. Periwound temperature was noted as No Abnormality. The periwound has tenderness on palpation. 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 There was a Excisional Skin/Subcutaneous Tissue Debridement with a total area of 0.02 sq cm performed by [name], MD. With the following instrument(s): Curette to remove  Viable and Non-Viable tissue/material. Material removed includes Subcutaneous Tissue, Slough, and Biofilm after achieving pain control using Other (lidocaine 4%). No specimens were taken. A time out was conducted at 10:29, prior to the start of the procedure. A Minimum amount of bleeding was controlled with Pressure. The procedure was tolerated well with a pain level of 0  throughout and a pain level of 0 following the procedure. Patient s Level of Consciousness post procedure was recorded as Awake and Alert. Post Debridement Measurements: 0.2cm length x 0.2cm width x 0.5cm depth; 0.016cm^3 volume. [address] SAMUAL, BEALS ([id number]) debridement Stage noted as Category/Stage III. Character of Wound/Ulcer Post Debridement is stable. Severity of Tissue Post Debridement is: Fat layer exposed. Post procedure Diagnosis Wound #3: Same as Pre-Procedure Plan Wound Cleansing: Wound #3 Right Amputation Site - Below Knee: Clean wound with Normal Saline. May Shower, gently pat wound dry prior to applying new dressing. Anesthetic (add to Medication List): Wound #3 Right Amputation Site - Below Knee: Topical Lidocaine 4% cream applied to wound bed prior to debridement (In Clinic Only). 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 Follow-up Appointments: Wound #3 Right Amputation Site - Below Knee: Return Appointment in 1 week. Off-Loading: Wound #3 Right Amputation Site - Below Knee: Other: - do not wear your prosthesis #1 recurrent wound on the right distal amputation site. #2 this is behaving much the same as on his previous stays in this clinic. Appears to be filling in there is less depth and certainly no exposed bone #3 he is going to have an angiogram on  September 25 by Dr. Fletcher Anon. CT scan on September 19. We are going to make sure that there isn't anything correctable here. He is already of think had maximal adjustments to the prosthesis he uses and we just don't seem to be able to maintain skin and subcutaneous tissue integrity in this area in spite of the fact that he wore a similar prosthesis for many years Electronic Signature(s) Signed: 06/06/2018 5:30:50 PM By: Linton Ham MD Entered By: Linton Ham on 06/06/2018 10:44:02 Andrew Lyons ([id number]) -------------------------------------------------------------------------------- SuperBill Details Patient Name: Andrew Lyons Date of Service: 06/06/2018 Medical Record Number: [id number] Patient Account Number: [id number] Date of Birth/Sex: 08-17-1946 (72 y.o. 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

## 2018-06-08 NOTE — Progress Notes (Signed)
DENY, CHEVEZ ([id number]) Visit Report for 06/06/2018 Arrival Information Details Patient Name: Andrew Lyons, Andrew Lyons Date of Service: 06/06/2018 10:15 AM Medical Record Number: [id number] Patient Account Number: [id number] Date of Birth/Sex: 04-30-1946 (72 y.o. M) Treating RN: Secundino Ginger Primary Care Alisson Rozell: [name], Delfino Lovett Other Clinician: Referring Romanita Fager: [name] Treating Kvion Shapley/Extender: [name] in Treatment: 3 Visit Information History Since Last Visit Added or deleted any medications: No Patient Arrived: Walker Any new allergies or adverse reactions: No Arrival Time: 10:12 Had a fall or experienced change in No Accompanied By: self activities of daily living that may affect Transfer Assistance: None risk of falls: Patient Identification Verified: Yes Signs or symptoms of abuse/neglect since last visito No Secondary Verification Process Completed: Yes Hospitalized since last visit: No Patient Requires Transmission-Based Precautions: No Implantable device outside of the clinic excluding No Patient Has Alerts: Yes cellular tissue based products placed in the center Patient Alerts: DM II since last visit: Has Dressing in Place as Prescribed: Yes Pain Present Now: No Electronic Signature(s) Signed: 06/06/2018 11:50:50 AM By: Secundino Ginger Entered By: Secundino Ginger on 06/06/2018 10:13:48 Andrew Lyons ([id number]) -------------------------------------------------------------------------------- Encounter Discharge Information Details Patient Name: Andrew Lyons Date of Service: 06/06/2018 10:15 AM Medical Record Number: [id number] Patient Account Number: [id number] Date of Birth/Sex: 03/18/1946 (72 y.o. M) Treating RN: Roger Shelter Primary Care Sieara Bremer: [name], Delfino Lovett Other Clinician: Referring Konner Saiz: [name], [name] Treating Itzia Cunliffe/Extender: [name] in Treatment: 3 Encounter Discharge  Information Items Discharge Condition: Stable Ambulatory Status: Walker Discharge Destination: Home Transportation: Private Auto Schedule Follow-up Appointment: Yes Clinical Summary of Care: Electronic Signature(s) Signed: 06/06/2018 5:03:38 PM By: Roger Shelter Entered By: Roger Shelter on 06/06/2018 10:38:29 Andrew Lyons ([id number]) -------------------------------------------------------------------------------- Lower Extremity Assessment Details Patient Name: Andrew Lyons Date of Service: 06/06/2018 10:15 AM Medical Record Number: [id number] Patient Account Number: [id number] Date of Birth/Sex: 08-Sep-1946 (72 y.o. M) Treating RN: Secundino Ginger Primary Care Mace Weinberg: [name], Delfino Lovett Other Clinician: Referring Ruperto Kiernan: [name], [name] Treating Jizel Cheeks/Extender: [name] in Treatment: 3 Electronic Signature(s) Signed: 06/06/2018 11:50:50 AM By: Secundino Ginger Entered By: Secundino Ginger on 06/06/2018 10:21:04 Andrew Lyons ([id number]) -------------------------------------------------------------------------------- Multi Wound Chart Details Patient Name: Andrew Lyons Date of Service: 06/06/2018 10:15 AM Medical Record Number: [id number] Patient Account Number: [id number] Date of Birth/Sex: 10-22-1945 (72 y.o. M) Treating RN: Roger Shelter Primary Care Taje Littler: [name], Delfino Lovett Other Clinician: Referring Hudson Majkowski: [name], [name] Treating Jamie Belger/Extender: [name] in Treatment: 3 Vital Signs Height(in): 70 Pulse(bpm): 34 Weight(lbs): 264.5 Blood Pressure(mmHg): 137/78 Body Mass Index(BMI): 38 Temperature(F): 98.1 Respiratory Rate 16 (breaths/min): Photos: [N/A:N/A] Wound Location: Right Amputation Site - Below N/A N/A Knee Wounding Event: Pressure Injury N/A N/A Primary Etiology: Pressure Ulcer N/A N/A Secondary Etiology: Diabetic Wound/Ulcer of the N/A N/A Lower Extremity Comorbid History:  Cataracts, Chronic Obstructive N/A N/A Pulmonary Disease (COPD), Sleep Apnea, Hypotension, Myocardial Infarction, Type II Diabetes, Osteoarthritis, Neuropathy Date Acquired: 02/24/2018 N/A N/A Weeks of Treatment: 3 N/A N/A Wound Status: Open N/A N/A Measurements L x W x D 0.2x0.1x0.4 N/A N/A (cm) Area (cm) : 0.016 N/A N/A Volume (cm) : 0.006 N/A N/A % Reduction in Area: 96.80% N/A N/A % Reduction in Volume: 98.00% N/A N/A Classification: Category/Stage III N/A N/A Exudate Amount: None Present N/A N/A Wound Margin: Distinct, outline attached N/A N/A Granulation Amount: None Present (0%) N/A N/A Necrotic Amount: Large (67-100%) N/A N/A Necrotic Tissue: Eschar N/A N/A Exposed Structures:  Fat Layer (Subcutaneous N/A N/A Tissue) Exposed: Andrew Lyons, Andrew Lyons ([id number]) Epithelialization: None N/A N/A Debridement: Debridement - Excisional N/A N/A Pre-procedure 10:29 N/A N/A Verification/Time Out Taken: Pain Control: Other N/A N/A Tissue Debrided: Subcutaneous, Slough N/A N/A Level: Skin/Subcutaneous Tissue N/A N/A Debridement Area (sq cm): 0.02 N/A N/A Instrument: Curette N/A N/A Bleeding: Minimum N/A N/A Hemostasis Achieved: Pressure N/A N/A Procedural Pain: 0 N/A N/A Post Procedural Pain: 0 N/A N/A Debridement Treatment Procedure was tolerated well N/A N/A Response: Post Debridement 0.2x0.2x0.5 N/A N/A Measurements L x W x D (cm) Post Debridement Volume: 0.016 N/A N/A (cm) Post Debridement Stage: Category/Stage III N/A N/A Periwound Skin Texture: Callus: Yes N/A N/A Excoriation: No Induration: No Crepitus: No Rash: No Scarring: No Periwound Skin Moisture: Maceration: No N/A N/A Dry/Scaly: No Periwound Skin Color: Atrophie Blanche: No N/A N/A Cyanosis: No Ecchymosis: No Erythema: No Hemosiderin Staining: No Mottled: No Pallor: No Rubor: No Temperature: No Abnormality N/A N/A Tenderness on Palpation: Yes N/A N/A Wound Preparation: Ulcer Cleansing:  N/A N/A Rinsed/Irrigated with Saline Topical Anesthetic Applied: Other: lidocaine 4% Procedures Performed: Debridement N/A N/A Treatment Notes Wound #3 (Right Amputation Site - Below Knee) 1. Cleansed with: Clean wound with Normal Saline 2. Anesthetic Topical Lidocaine 4% cream to wound bed prior to debridement 3. Peri-wound Care: Skin Prep 5. Secondary Dressing Applied Bordered Foam Dressing Andrew Lyons, Andrew Lyons ([id number]) Notes silvercel packed into wound and cover wound Electronic Signature(s) Signed: 06/06/2018 5:30:50 PM By: Linton Ham MD Entered By: Linton Ham on 06/06/2018 10:39:30 Andrew Lyons ([id number]) -------------------------------------------------------------------------------- [place] Details Patient Name: Andrew Lyons Date of Service: 06/06/2018 10:15 AM Medical Record Number: [id number] Patient Account Number: [id number] Date of Birth/Sex: 1946/08/23 (72 y.o. M) Treating RN: Roger Shelter Primary Care Ilya Ess: [name], Delfino Lovett Other Clinician: Referring Clevester Helzer: [name], [name] Treating Niketa Turner/Extender: [name] in Treatment: 3 Active Inactive ` Abuse / Safety / Falls / Self Care Management Nursing Diagnoses: Impaired physical mobility Goals: Patient will not develop complications from immobility Date Initiated: 05/16/2018 Target Resolution Date: 07/27/2018 Goal Status: Active Interventions: Assess fall risk on admission and as needed Notes: ` Orientation to the Wound Care Program Nursing Diagnoses: Knowledge deficit related to the wound healing center program Goals: Patient/caregiver will verbalize understanding of the [place] Date Initiated: 05/16/2018 Target Resolution Date: 07/27/2018 Goal Status: Active Interventions: Provide education on orientation to the wound center Notes: ` Wound/Skin Impairment Nursing Diagnoses: Impaired tissue  integrity Goals: Ulcer/skin breakdown will heal within 14 weeks Date Initiated: 05/16/2018 Target Resolution Date: 07/27/2018 Goal Status: Active Interventions: Andrew Lyons, Andrew Lyons ([id number]) Assess patient/caregiver ability to obtain necessary supplies Assess patient/caregiver ability to perform ulcer/skin care regimen upon admission and as needed Assess ulceration(s) every visit Notes: Electronic Signature(s) Signed: 06/06/2018 5:03:38 PM By: Roger Shelter Entered By: Roger Shelter on 06/06/2018 10:26:47 Andrew Lyons ([id number]) -------------------------------------------------------------------------------- Pain Assessment Details Patient Name: Andrew Lyons Date of Service: 06/06/2018 10:15 AM Medical Record Number: [id number] Patient Account Number: [id number] Date of Birth/Sex: November 27, 1945 (72 y.o. M) Treating RN: Secundino Ginger Primary Care Jadden Yim: [name], Delfino Lovett Other Clinician: Referring Jaxin Fulfer: [name] Treating Koichi Platte/Extender: [name] in Treatment: 3 Active Problems Location of Pain Severity and Description of Pain Patient Has Paino No Site Locations Pain Management and Medication Current Pain Management: Electronic Signature(s) Signed: 06/06/2018 11:50:50 AM By: Secundino Ginger Entered By: Secundino Ginger on 06/06/2018 10:13:56 Andrew Lyons ([id number]) -------------------------------------------------------------------------------- Patient/Caregiver Education Details Patient Name:  Andrew Lyons Date of Service: 06/06/2018 10:15 AM Medical Record Number: [id number] Patient Account Number: [id number] Date of Birth/Gender: 05/03/1946 (72 y.o. M) Treating RN: Roger Shelter Primary Care Physician: [name], Delfino Lovett Other Clinician: Referring Physician: [name] Treating Physician/Extender: [name] in Treatment: 3 Education Assessment Education Provided To: Patient Education Topics  Provided Wound Debridement: Handouts: Wound Debridement Methods: Explain/Verbal Responses: State content correctly Wound/Skin Impairment: Handouts: Caring for Your Ulcer Methods: Explain/Verbal Responses: State content correctly Electronic Signature(s) Signed: 06/06/2018 5:03:38 PM By: Roger Shelter Entered By: Roger Shelter on 06/06/2018 10:38:44 Andrew Lyons ([id number]) -------------------------------------------------------------------------------- Wound Assessment Details Patient Name: Andrew Lyons Date of Service: 06/06/2018 10:15 AM Medical Record Number: [id number] Patient Account Number: [id number] Date of Birth/Sex: 1946/08/07 (72 y.o. M) Treating RN: Secundino Ginger Primary Care Render Marley: [name], Delfino Lovett Other Clinician: Referring Chyna Kneece: [name] Treating Tilmon Wisehart/Extender: [name] in Treatment: 3 Wound Status Wound Number: 3 Primary Pressure Ulcer Etiology: Wound Location: Right Amputation Site - Below Knee Secondary Diabetic Wound/Ulcer of the Lower Extremity Wounding Event: Pressure Injury Etiology: Date Acquired: 02/24/2018 Wound Open Weeks Of Treatment: 3 Status: Clustered Wound: No Comorbid Cataracts, Chronic Obstructive Pulmonary History: Disease (COPD), Sleep Apnea, Hypotension, Myocardial Infarction, Type II Diabetes, Osteoarthritis, Neuropathy Photos Photo Uploaded By: Secundino Ginger on 06/06/2018 10:24:18 Wound Measurements Length: (cm) 0.2 Width: (cm) 0.1 Depth: (cm) 0.4 Area: (cm) 0.016 Volume: (cm) 0.006 % Reduction in Area: 96.8% % Reduction in Volume: 98% Epithelialization: None Tunneling: No Undermining: No Wound Description Classification: Category/Stage III Wound Margin: Distinct, outline attached Exudate Amount: None Present Foul Odor After Cleansing: No Slough/Fibrino No Wound Bed Granulation Amount: None Present (0%) Exposed Structure Necrotic Amount: Large (67-100%) Fat Layer  (Subcutaneous Tissue) Exposed: Yes Necrotic Quality: Eschar Periwound Skin Texture Texture Color No Abnormalities Noted: No No Abnormalities Noted: No Callus: Yes Atrophie Blanche: No Andrew Lyons, Andrew Lyons ([id number]) Crepitus: No Cyanosis: No Excoriation: No Ecchymosis: No Induration: No Erythema: No Rash: No Hemosiderin Staining: No Scarring: No Mottled: No Pallor: No Moisture Rubor: No No Abnormalities Noted: No Dry / Scaly: No Temperature / Pain Maceration: No Temperature: No Abnormality Tenderness on Palpation: Yes Wound Preparation Ulcer Cleansing: Rinsed/Irrigated with Saline Topical Anesthetic Applied: Other: lidocaine 4%, Treatment Notes Wound #3 (Right Amputation Site - Below Knee) 1. Cleansed with: Clean wound with Normal Saline 2. Anesthetic Topical Lidocaine 4% cream to wound bed prior to debridement 3. Peri-wound Care: Skin Prep 5. Secondary Dressing Applied Bordered Foam Dressing Notes silvercel packed into wound and cover wound Electronic Signature(s) Signed: 06/06/2018 11:50:50 AM By: Secundino Ginger Entered By: Secundino Ginger on 06/06/2018 10:20:54 Andrew Lyons ([id number]) -------------------------------------------------------------------------------- Vitals Details Patient Name: Andrew Lyons Date of Service: 06/06/2018 10:15 AM Medical Record Number: [id number] Patient Account Number: [id number] Date of Birth/Sex: 1946/04/09 (72 y.o. M) Treating RN: Secundino Ginger Primary Care Starletta Houchin: [name], Delfino Lovett Other Clinician: Referring Marquon Alcala: [name] Treating Jewelianna Pancoast/Extender: [name] in Treatment: 3 Vital Signs Time Taken: 10:12 Temperature (F): 98.1 Height (in): 70 Pulse (bpm): 69 Weight (lbs): 264.5 Respiratory Rate (breaths/min): 16 Body Mass Index (BMI): 37.9 Blood Pressure (mmHg): 137/78 Reference Range: 80 - 120 mg / dl Electronic Signature(s) Signed: 06/06/2018 11:50:50 AM By: Secundino Ginger Entered  BySecundino Ginger on 06/06/2018 10:14:24

## 2018-06-13 ENCOUNTER — Encounter: Payer: PPO | Admitting: Internal Medicine

## 2018-06-13 DIAGNOSIS — E11622 Type 2 diabetes mellitus with other skin ulcer: Secondary | ICD-10-CM | POA: Diagnosis not present

## 2018-06-13 DIAGNOSIS — L89893 Pressure ulcer of other site, stage 3: Secondary | ICD-10-CM | POA: Diagnosis not present

## 2018-06-14 ENCOUNTER — Ambulatory Visit
Admission: RE | Admit: 2018-06-14 | Discharge: 2018-06-14 | Disposition: A | Discharge status: Home / Self Care | Payer: PPO | Source: Ambulatory Visit | Attending: Internal Medicine | Admitting: Internal Medicine

## 2018-06-14 DIAGNOSIS — N401 Enlarged prostate with lower urinary tract symptoms: Secondary | ICD-10-CM | POA: Diagnosis not present

## 2018-06-14 DIAGNOSIS — L03115 Cellulitis of right lower limb: Secondary | ICD-10-CM | POA: Diagnosis not present

## 2018-06-14 DIAGNOSIS — L98499 Non-pressure chronic ulcer of skin of other sites with unspecified severity: Secondary | ICD-10-CM | POA: Insufficient documentation

## 2018-06-14 DIAGNOSIS — S81801A Unspecified open wound, right lower leg, initial encounter: Secondary | ICD-10-CM | POA: Diagnosis not present

## 2018-06-14 DIAGNOSIS — E11622 Type 2 diabetes mellitus with other skin ulcer: Secondary | ICD-10-CM | POA: Insufficient documentation

## 2018-06-14 DIAGNOSIS — T8189XA Other complications of procedures, not elsewhere classified, initial encounter: Secondary | ICD-10-CM | POA: Diagnosis not present

## 2018-06-16 NOTE — Progress Notes (Signed)
DEAKEN, JURGENS ([id number]) Visit Report for 06/13/2018 Arrival Information Details Patient Name: Andrew Lyons, Andrew Lyons Date of Service: 06/13/2018 10:15 AM Medical Record Number: [id number] Patient Account Number: [id number] Date of Birth/Sex: 1946-01-19 (72 y.o. M) Treating RN: Cornell Barman Primary Care Koa Zoeller: [name], Delfino Lovett Other Clinician: Referring Tye Vigo: [name], [name] Treating Khalie Wince/Extender: [name] in Treatment: 4 Visit Information History Since Last Visit Added or deleted any medications: No Patient Arrived: Walker Any new allergies or adverse reactions: No Arrival Time: 10:18 Had a fall or experienced change in No Accompanied By: self activities of daily living that may affect Transfer Assistance: None risk of falls: Patient Identification Verified: Yes Signs or symptoms of abuse/neglect since last visito No Secondary Verification Process Completed: Yes Hospitalized since last visit: No Patient Requires Transmission-Based Precautions: No Implantable device outside of the clinic excluding No Patient Has Alerts: Yes cellular tissue based products placed in the center Patient Alerts: DM II since last visit: Has Dressing in Place as Prescribed: Yes Pain Present Now: No Electronic Signature(s) Signed: 06/13/2018 3:44:28 PM By: Lorine Bears RCP, RRT, CHT Entered By: Lorine Bears on 06/13/2018 10:18:55 Andrew Lyons ([id number]) -------------------------------------------------------------------------------- Lower Extremity Assessment Details Patient Name: Andrew Lyons Date of Service: 06/13/2018 10:15 AM Medical Record Number: [id number] Patient Account Number: [id number] Date of Birth/Sex: 1946-07-08 (72 y.o. M) Treating RN: Secundino Ginger Primary Care Sheralyn Pinegar: [name], Delfino Lovett Other Clinician: Referring Omeka Holben: [name], [name] Treating Vasti Yagi/Extender: [name] in  Treatment: 4 Electronic Signature(s) Signed: 06/13/2018 1:04:55 PM By: Secundino Ginger Entered By: Secundino Ginger on 06/13/2018 10:32:21 Andrew Lyons ([id number]) -------------------------------------------------------------------------------- Multi Wound Chart Details Patient Name: Andrew Lyons Date of Service: 06/13/2018 10:15 AM Medical Record Number: [id number] Patient Account Number: [id number] Date of Birth/Sex: 04/08/1946 (72 y.o. M) Treating RN: Cornell Barman Primary Care Risa Auman: [name], Delfino Lovett Other Clinician: Referring Jayquan Bradsher: [name], [name] Treating Weber Monnier/Extender: [name] in Treatment: 4 Vital Signs Height(in): 70 Pulse(bpm): 2 Weight(lbs): 264.5 Blood Pressure(mmHg): 136/62 Body Mass Index(BMI): 38 Temperature(F): 98.2 Respiratory Rate 16 (breaths/min): Photos: [N/A:N/A] Wound Location: Right Amputation Site - Below N/A N/A Knee Wounding Event: Pressure Injury N/A N/A Primary Etiology: Pressure Ulcer N/A N/A Secondary Etiology: Diabetic Wound/Ulcer of the N/A N/A Lower Extremity Comorbid History: Cataracts, Chronic Obstructive N/A N/A Pulmonary Disease (COPD), Sleep Apnea, Hypotension, Myocardial Infarction, Type II Diabetes, Osteoarthritis, Neuropathy Date Acquired: 02/24/2018 N/A N/A Weeks of Treatment: 4 N/A N/A Wound Status: Open N/A N/A Measurements L x W x D 0.4x0.4x0.7 N/A N/A (cm) Area (cm) : 0.126 N/A N/A Volume (cm) : 0.088 N/A N/A % Reduction in Area: 75.00% N/A N/A % Reduction in Volume: 70.90% N/A N/A Classification: Category/Stage III N/A N/A Exudate Amount: None Present N/A N/A Wound Margin: Distinct, outline attached N/A N/A Granulation Amount: None Present (0%) N/A N/A Necrotic Amount: Large (67-100%) N/A N/A Necrotic Tissue: Eschar N/A N/A Exposed Structures: Fat Layer (Subcutaneous N/A N/A Tissue) Exposed: Andrew Lyons, Andrew Lyons ([id number]) Epithelialization: None N/A N/A Debridement:  Debridement - Excisional N/A N/A Pre-procedure 10:45 N/A N/A Verification/Time Out Taken: Pain Control: Other N/A N/A Tissue Debrided: Callus, Subcutaneous N/A N/A Level: Skin/Subcutaneous Tissue N/A N/A Debridement Area (sq cm): 0.16 N/A N/A Instrument: Curette N/A N/A Bleeding: Minimum N/A N/A Hemostasis Achieved: Pressure N/A N/A Debridement Treatment Procedure was tolerated well N/A N/A Response: Post Debridement 0.4x0.4x0.7 N/A N/A Measurements L x W x D (cm) Post Debridement Volume: 0.088 N/A N/A (cm) Post Debridement Stage:  Category/Stage III N/A N/A Periwound Skin Texture: Callus: Yes N/A N/A Excoriation: No Induration: No Crepitus: No Rash: No Scarring: No Periwound Skin Moisture: Maceration: No N/A N/A Dry/Scaly: No Periwound Skin Color: Atrophie Blanche: No N/A N/A Cyanosis: No Ecchymosis: No Erythema: No Hemosiderin Staining: No Mottled: No Pallor: No Rubor: No Temperature: No Abnormality N/A N/A Tenderness on Palpation: Yes N/A N/A Wound Preparation: Ulcer Cleansing: N/A N/A Rinsed/Irrigated with Saline Topical Anesthetic Applied: Other: lidocaine 4% Procedures Performed: Debridement N/A N/A Treatment Notes Electronic Signature(s) Signed: 06/13/2018 4:51:54 PM By: Linton Ham MD Entered By: Linton Ham on 06/13/2018 11:35:20 Andrew Lyons ([id number]) -------------------------------------------------------------------------------- Multi-Disciplinary Care Plan Details Patient Name: Andrew Lyons Date of Service: 06/13/2018 10:15 AM Medical Record Number: [id number] Patient Account Number: [id number] Date of Birth/Sex: 1945/10/15 (72 y.o. M) Treating RN: Cornell Barman Primary Care Jamyria Ozanich: [name], Delfino Lovett Other Clinician: Referring Neema Fluegge: [name], [name] Treating Mirren Gest/Extender: [name] in Treatment: 4 Active Inactive ` Abuse / Safety / Falls / Self Care Management Nursing Diagnoses: Impaired  physical mobility Goals: Patient will not develop complications from immobility Date Initiated: 05/16/2018 Target Resolution Date: 07/27/2018 Goal Status: Active Interventions: Assess fall risk on admission and as needed Notes: ` Orientation to the Wound Care Program Nursing Diagnoses: Knowledge deficit related to the wound healing center program Goals: Patient/caregiver will verbalize understanding of the [place] Date Initiated: 05/16/2018 Target Resolution Date: 07/27/2018 Goal Status: Active Interventions: Provide education on orientation to the wound center Notes: ` Wound/Skin Impairment Nursing Diagnoses: Impaired tissue integrity Goals: Ulcer/skin breakdown will heal within 14 weeks Date Initiated: 05/16/2018 Target Resolution Date: 07/27/2018 Goal Status: Active Interventions: Andrew Lyons, Andrew Lyons ([id number]) Assess patient/caregiver ability to obtain necessary supplies Assess patient/caregiver ability to perform ulcer/skin care regimen upon admission and as needed Assess ulceration(s) every visit Notes: Electronic Signature(s) Signed: 06/14/2018 5:07:26 PM By: Gretta Cool, BSN, RN, CWS, Kim RN, BSN Entered By: Gretta Cool, BSN, RN, CWS, Kim on 06/13/2018 10:41:14 Andrew Lyons ([id number]) -------------------------------------------------------------------------------- Pain Assessment Details Patient Name: Andrew Lyons Date of Service: 06/13/2018 10:15 AM Medical Record Number: [id number] Patient Account Number: [id number] Date of Birth/Sex: 16-Jan-1946 (72 y.o. M) Treating RN: Cornell Barman Primary Care Arihaan Bellucci: [name], Delfino Lovett Other Clinician: Referring Muslima Toppins: [name], [name] Treating Ayva Veilleux/Extender: [name] in Treatment: 4 Active Problems Location of Pain Severity and Description of Pain Patient Has Paino No Site Locations Pain Management and Medication Current Pain Management: Electronic Signature(s) Signed:  06/13/2018 3:44:28 PM By: Lorine Bears RCP, RRT, CHT Signed: 06/14/2018 5:07:26 PM By: Gretta Cool, BSN, RN, CWS, Kim RN, BSN Entered By: Lorine Bears on 06/13/2018 10:19:04 Andrew Lyons ([id number]) -------------------------------------------------------------------------------- Wound Assessment Details Patient Name: Andrew Lyons Date of Service: 06/13/2018 10:15 AM Medical Record Number: [id number] Patient Account Number: [id number] Date of Birth/Sex: 06-29-1946 (72 y.o. 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 Event: Pressure Injury Etiology: Date Acquired: 02/24/2018 Wound Open Weeks Of Treatment: 4 Status: Clustered Wound: No Comorbid Cataracts, Chronic Obstructive Pulmonary History: Disease (COPD), Sleep Apnea, Hypotension, Myocardial Infarction, Type II Diabetes, Osteoarthritis, Neuropathy Photos Wound Measurements Length: (cm) 0.4 Width: (cm) 0.4 Depth: (cm) 0.7 Area: (cm) 0.126 Volume: (cm) 0.088 % Reduction in Area: 75% %  Reduction in Volume: 70.9% Epithelialization: None Tunneling: No Undermining: No Wound Description Classification: Category/Stage III Wound Margin: Distinct, outline attached Exudate Amount: Medium Exudate Type: Serous Exudate Color: amber Foul Odor After Cleansing: No Slough/Fibrino No Wound Bed Granulation Amount: None Present (0%) Exposed Structure Necrotic Amount: Large (67-100%) Fat Layer (Subcutaneous Tissue) Exposed: Yes Necrotic Quality: Eschar Periwound Skin Texture Texture Color No Abnormalities Noted: No No Abnormalities Noted: No Andrew Lyons, Andrew Lyons  ([id number]) Callus: Yes Atrophie Blanche: No Crepitus: No Cyanosis: No Excoriation: No Ecchymosis: No Induration: No Erythema: No Rash: No Hemosiderin Staining: No Scarring: No Mottled: No Pallor: No Moisture Rubor: No No Abnormalities Noted: No Dry / Scaly: No Temperature / Pain Maceration: No Temperature: No Abnormality Tenderness on Palpation: Yes Wound Preparation Ulcer Cleansing: Rinsed/Irrigated with Saline Topical Anesthetic Applied: Other: lidocaine 4%, Electronic Signature(s) Signed: 06/14/2018 2:03:18 PM By: [name] Signed: 06/14/2018 5:07:26 PM By: Gretta Cool, BSN, RN, CWS, Kim RN, BSN Entered By: [name] on 06/14/2018 14:03:17 Andrew Lyons, Andrew Lyons ([id number]) -------------------------------------------------------------------------------- Vitals Details Patient Name: Andrew Lyons Date of Service: 06/13/2018 10:15 AM Medical Record Number: [id number] Patient Account Number: [id number] Date of Birth/Sex: 1946-05-12 (72 y.o. M) Treating RN: Cornell Barman Primary Care Autumn Pruitt: [name], Delfino Lovett Other Clinician: Referring Patricia Fargo: [name], [name] Treating Eltha Tingley/Extender: [name] in Treatment: 4 Vital Signs Time Taken: 10:20 Temperature (F): 98.2 Height (in): 70 Pulse (bpm): 58 Weight (lbs): 264.5 Respiratory Rate (breaths/min): 16 Body Mass Index (BMI): 37.9 Blood Pressure (mmHg): 136/62 Reference Range: 80 - 120 mg / dl Electronic Signature(s) Signed: 06/13/2018 3:44:28 PM By: Lorine Bears RCP, RRT, CHT Entered By: Becky Sax, Amado Nash on 06/13/2018 10:22:06

## 2018-06-16 NOTE — Progress Notes (Signed)
Andrew Lyons, Andrew Lyons ([id number]) Visit Report for 06/13/2018 Debridement Details Patient Name: Andrew Lyons, Andrew Lyons Date of Service: 06/13/2018 10:15 AM Medical Record Number: [id number] Patient Account Number: [id number] Date of Birth/Sex: 1945/09/30 (72 y.o. M) Treating RN: Cornell Barman Primary Care Provider: [name], Delfino Lovett Other Clinician: Referring Provider: [name], [name] Treating Provider/Extender: [name] in Treatment: 4 Debridement Performed for Wound #3 Right Amputation Site - Below Knee Assessment: Performed By: Physician [name], MD Debridement Type: Debridement Severity of Tissue Pre Fat layer exposed Debridement: Level of Consciousness (Pre- Awake and Alert procedure): Pre-procedure Verification/Time Yes - 10:45 Out Taken: Start Time: 10:45 Pain Control: Other : lidocaine 4% Total Area Debrided (L x W): 0.4 (cm) x 0.4 (cm) = 0.16 (cm) Tissue and other material Viable, Non-Viable, Callus, Subcutaneous debrided: Level: Skin/Subcutaneous Tissue Debridement Description: Excisional Instrument: Curette Bleeding: Minimum Hemostasis Achieved: Pressure End Time: 10:48 Response to Treatment: Procedure was tolerated well Level of Consciousness Awake and Alert (Post-procedure): Post Debridement Measurements of Total Wound Length: (cm) 0.4 Stage: Category/Stage III Width: (cm) 0.4 Depth: (cm) 0.7 Volume: (cm) 0.088 Character of Wound/Ulcer Post Requires Further Debridement Debridement: Severity of Tissue Post Debridement: Fat layer exposed Post Procedure Diagnosis Same as Pre-procedure Electronic Signature(s) Signed: 06/13/2018 4:51:54 PM By: Linton Ham MD Signed: 06/14/2018 5:07:26 PM By: Gretta Cool, BSN, RN, CWS, Kim RN, BSN Entered By: Gretta Cool, BSN, RN, CWS, Kim on 06/13/2018 10:48:37 Andrew Lyons, Andrew Lyons ([id number], POMPEY  ([id number]) -------------------------------------------------------------------------------- HPI Details Patient Name: Andrew Lyons Date of Service: 06/13/2018 10:15 AM Medical Record Number: [id number] Patient Account Number: [id number] Date of Birth/Sex: [birth year] (72 y.o. M) Treating RN: Cornell Barman Primary Care Provider: [name], Delfino Lovett Other Clinician: Referring Provider: [name], [name] Treating Provider/Extender: [name] in Treatment: 4 History of Present Illness HPI Description: 01/04/17; this is a 72 year old diabetic man who has a remote history of a traumatic lower extremity damage at age 88 requiring an amputation. He tells me spent 2 years walking on crutches then ultimately has been walking on prosthesis without any trouble since then. Several months ago he had a new prosthesis and developed an open area in the right stump in December. He saw podiatry Dr. Prudence Davidson who noted that this was really out of his practice jurisdiction and referred him here. He has been using mupirocin. He has continued to walk on the prosthesis. The prosthesis itself as been adjusted by the prostatitis. He does not have a known arterial issue. He tells me he probably has diabetic neuropathy. He had a wound that took a long time to heal surrounding the actual amputation itself however is not had more recent wounds. 01/10/17 Culture from last week grew pseudomonas. change AB to cefdinir yesterday from doxy. he is not using his prosthesis which I emphasized although he is having it adjusted next week 01/17/17; he is completing his antibiotics in the next day or 2. We've been using silver alginate. His prosthesis is away being readjusted. He had an x-ray of the underlying bone here apparently at his podiatrist's office although I'll need to have a look at that and we can't find the result then he may need this re-x-rayed. 01/24/17; he has completed his antibiotics. Change to Silver  collagen last week. No real change in the wound this week. 01/31/17 repeat culture I did last week showed a few Pseudomonas. This only has intermediate sensitivity to quinolones I therefore put him back on a week of Cefdinir. X-ray of the  stump did not show any bone destruction. We are attempting to get vascular studies done through Dr.Arida's office on May 23. 02/07/17; arterial studies are booked for May 23 no major change. He has completed his antibiotics 02/14/17 arterial studies on Thursday 5/23. using collagen 02/21/17; the patient had his arterial studies. Both his ABIs were good at 1.0 on the right and 1.1 on the left. His abdominal and iliac arteries revealed atherosclerosis without focal stenosis duplex imaging of the right lower leg revealed heterogeneous plaque and biphasic Doppler waveforms from the right CFA to the popliteal artery. The right distal SFA and tibial peroneal trunk velocities were elevated. 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 The proximal right ATA and PTA were patent above the amputation level. We have been using Silver collagen with some improvement in the wound dimensions 02/28/17; we went ahead and did TCOM measurements on this patient. One of the leads was nonfunctional however on the right leg both the proximal and periwound leads registered greater than 70 mmHg oxygen tension. Interestingly the comparison lead on the left leg/relative wound level was in the mid 40s. This allays any fears I had about contributing wound ischemia. In the meantime is measurements today were 0.2 x 0.4 x 0.7 the wound is now a small divot most of it appears to be epithelialized with only a small open area at that depth. There is no evidence of surrounding infection 03/07/17; continued improvement down to 0.3 cm in depth. This is a very tiny circular opening. No drainage no tenderness. Looks like it is been progressing towards closure. He has been using Silver collagen  although I think this is too small to use this as a primary dressing. 03/14/17; still 0.3 cm in depth tiny circular wound. No drainage or tenderness. Used Iodosorb ointment starting last week 03/21/17; still the depth and this wound. Using a #3 curet I remove necrotic material from the wound base. There is a divot here. Not making enough improvement on the Iodosorb, I change back to silver collagen this week. 03/28/17; depth down to 0.2. I cannot actually see a surface of this wound. We've been using silver collagen which I will continue 04/04/17 on evaluation today patient appears to be doing very well. In fact his wound appears to be completely healed though there is a slight eschar over the area I see no fluctuance underneath and I am reluctant to attempt scraping on the area due to the very center superficial nature of the eschar. Patient has no discomfort. 04/11/17; the patient went back to [facility] to his prosthetist however his leg was too swollen on the right deficient into the prosthesis. The wound itself on the tip of his stump on the right is closed and was close last week. I see no reason to be concerned about this. Andrew Lyons, Andrew Lyons ([id number]) READMISSION 10/25/17; this is a patient that we had in clinic from April to July 2018 with a probing tiny open area on his right below sided below-knee amputation site. X-ray of this area did not show osteomyelitis. I had some concern about this being ischemic for a while although we did ABIs and T Coms that did a lot to allay these concerns. The patient is a type II diabetic although I don't have a recent hemoglobin A1c. He tells me that after he left the clinic last time he got a new prosthesis, not the prosthesis that originally caused wounds in this area. He states about a month ago  used noted some dark discoloration in his skin or at least his wife did when he was getting out of the shower. He since has noted increasing pain and  tenderness when he walks around this area. He arrives in clinic with no open wound 11/01/17; small probing wound on the medial aspect of the stump from a distal amputation site remotely. Surprisingly culture I did last week showed gram-positive cocci but did not culture. X-ray showed edema but no bony abnormality no soft tissue gas etc. we've been using silver alginate. He is offloading this using a scooter 11/08/17; small probing wound on the medial aspect of his stump from a distal amputation done remotely. He arrived in here with a small probing hold the did not go to bone. Culture I did of the discharged from here was negative. Nevertheless I gave him a week's worth of Augmentin wondering whether this could've been anaerobic. He is not having any pain versus when he came in here. He does have callus on the medial aspect of the stump and in my mind this represents excess pressure on this area when he walks. The area has completely closed once again READMISSION 05/16/18 This is the third admission to the clinic for this patient who had a very distal below-knee amputation many years ago. She uses a modified prosthesis. We have had problems with recurrent wounds in the medial part of the stump which is a small area originally his distal tibia. We've been able to get this to close on 2 occasions. He tells me that this reopened about 2 months ago. Since then he's been "doctoring on it" himself I think packing it with collagen and covering it with alginate he had left over from previous visits. Previous plain x-rays of this area have not shown soft tissue or bone issues on the stump.he tells me he went to see vein and vascular in [place] in follow-up of this area although I don't have this in my notes I'll need to research this in Epic. By his description he says that the blood flow on the right was better than the left. He is a poorly controlled diabetic with a recent hemoglobin A1c of 9 last month  he has not yet on insulin. On 02/17/17 he had arterial studies. These were generally quite good with an ABI on the right of 1 on the left of 1.1. Duplex imaging with color Doppler of the right lower leg showed heterogeneous plaque and biphasic Doppler waveforms from the right SFA to the popliteal artery. Right distal SFA and tibial peroneal trunk velocities were elevated. He was felt to have a 30- 49% distal SFA stenosis however the right ATA and PTA were felt to be patent above the amputation level. I do not have that he actually saw a vascular surgeon I'll need to research this in Epic E May have actually been referring to the test just quoted 05/23/18; x-ray I did last week was negative for osteomyelitis. Culture the wound that probed to bone last time was negative [but not a bone culture]. We've been using silver alginate packing. This is now the third occasion that this wound has opened in precisely the same area with precisely the same appearance. The patient and his wife are somewhat frustrated by this. They have made all modifications to his prosthetic to offload this area. Nevertheless he develops callus with nonviable tissue underneath and a reopening of this wound. I really do not believe that he has underlying osteomyelitis although at  this point I am going to have to rule that out. I've ordered an unenhanced CT scan of the bone underneath his distal amputation. He also has some degree of PAD based on arterial studies done on 02/16/17. At that point duplex imaging suggested that the abdominal aorta and iliac vessels showed atherosclerosis without stenosis. There was heterogeneous plaque with biphasic Doppler waveforms in the right CFA to the popliteal artery right distal left SFA and tibial peroneal trunk velocities were elevated. I did a TCOM at that time that showed good results. We did get the wound to heal at that time. The wound remained closed for about 6 months only to reopen  recently. 05/30/18; the patient has his consult with Dr. Fletcher Anon on 06/04/18. his CT scan of the bone on the amputation site is apparently booked for 06/14/18. We are going to look over all of this to make sure there isn't a contributing factor of the recurrent breakdown in this area other than the prosthesis and the resultant pressure. This is the third recurrence of the wound and the precisely the same area. On presentation this goes down to bone. We've been using silver alginate packing 06/06/18; the patient is going for an angiogram on September 25 by Dr. Fletcher Anon. He has a CT scan on September 19. Using silver alginate. Depth of the wound is down to 0.5 cm 06/13/18; angiogram on September 25 and CT scan of the lower leg tomorrow. He is using silver alginate depth of the wound measured at 0.8 cm today. This is after aggressive debridement Electronic Signature(s) Signed: 06/13/2018 4:51:54 PM By: Linton Ham MD Andrew Lyons ([id number]) Entered By: Linton Ham on 06/13/2018 11:36:01 Andrew Lyons, Andrew Lyons ([id number]) -------------------------------------------------------------------------------- Physical Exam Details Patient Name: Andrew Lyons Date of Service: 06/13/2018 10:15 AM Medical Record Number: [id number] Patient Account Number: [id number] Date of Birth/Sex: 1945-10-06 (72 y.o. M) Treating RN: Cornell Barman Primary Care Provider: [name], Delfino Lovett Other Clinician: Referring Provider: [name], [name] Treating Provider/Extender: [name] in Treatment: 4 Constitutional Sitting or standing Blood Pressure is within target range for patient.. Pulse regular and within target range for patient.Marland Kitchen Respirations regular, non-labored and within target range.. Temperature is normal and within the target range for the patient.Marland Kitchen appears in no distress. Notes when examined; once again the wound is provided of callus and subcutaneous tissue and debris from the wound bed.  This is done with a #3 curet. Hemostasis with direct pressure. Electronic Signature(s) Signed: 06/13/2018 4:51:54 PM By: Linton Ham MD Entered By: Linton Ham on 06/13/2018 11:37:26 Andrew Lyons ([id number]) -------------------------------------------------------------------------------- Physician Orders Details Patient Name: Andrew Lyons Date of Service: 06/13/2018 10:15 AM Medical Record Number: [id number] Patient Account Number: [id number] Date of Birth/Sex: 04/26/1946 (72 y.o. M) Treating RN: Cornell Barman Primary Care Provider: [name], Delfino Lovett Other Clinician: Referring Provider: [name], [name] Treating Provider/Extender: [name] in Treatment: 4 Verbal / Phone Orders: No Diagnosis Coding Wound Cleansing Wound #3 Right Amputation Site - Below Knee o Clean wound with Normal Saline. o May Shower, gently pat wound dry prior to applying new dressing. Anesthetic (add to Medication List) Wound #3 Right Amputation Site - Below Knee o Topical Lidocaine 4% cream applied to wound bed prior to debridement (In Clinic Only). Primary Wound Dressing Wound #3 Right Amputation Site - Below Knee o Silver Alginate Secondary Dressing Wound #3 Right Amputation Site - Below Knee o Boardered Foam Dressing Dressing Change Frequency Wound #3 Right Amputation Site - Below  Knee o Change dressing every other day. Follow-up Appointments Wound #3 Right Amputation Site - Below Knee o Return Appointment in 1 week. Off-Loading Wound #3 Right Amputation Site - Below Knee o Other: - do not wear your prosthesis Electronic Signature(s) Signed: 06/13/2018 4:51:54 PM By: Linton Ham MD Signed: 06/14/2018 5:07:26 PM By: Gretta Cool, BSN, RN, CWS, Kim RN, BSN Entered By: Gretta Cool, BSN, RN, CWS, Kim on 06/13/2018 10:48:58 Andrew Lyons ([id number]) -------------------------------------------------------------------------------- Problem List  Details Patient Name: Andrew Lyons Date of Service: 06/13/2018 10:15 AM Medical Record Number: [id number] Patient Account Number: [id number] Date of Birth/Sex: 1946/08/29 (72 y.o. 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 M) Treating RN: Cornell Barman Primary Care Provider: [name], Delfino Lovett Other Clinician: Referring Provider: [name], [name] Treating Provider/Extender: [name] in Treatment: 4 Subjective History of Present Illness (HPI) 01/04/17; this is a 72 year old diabetic man who has a remote history of a traumatic lower extremity damage at age 59 requiring an amputation. He tells me spent 2 years walking on crutches then ultimately has been walking on prosthesis without any trouble since then. Several months ago he had a new prosthesis and developed an open area in the right stump in December. He saw podiatry Dr. Prudence Davidson who noted that this was really out of his  practice jurisdiction and referred him here. He has been using mupirocin. He has continued to walk on the prosthesis. The prosthesis itself as been adjusted by the prostatitis. He does not have a known arterial issue. He tells me he probably has diabetic neuropathy. He had a wound that took a long time to heal surrounding the actual amputation itself however is not had more recent wounds. 01/10/17 Culture from last week grew pseudomonas. change AB to cefdinir yesterday from doxy. he is not using his prosthesis which I emphasized although he is having it adjusted next week 01/17/17; he is completing his antibiotics in the next day or 2. We've been using silver alginate. His prosthesis is away being readjusted. He had an x-ray of the underlying bone here apparently at his podiatrist's office although I'll need to have a look at that and we can't find the result then he may need this re-x-rayed. 01/24/17; he has completed his antibiotics. Change to Silver collagen last week. No real change in the wound this week. 01/31/17 repeat culture I did last week showed a few Pseudomonas. This only has intermediate sensitivity to quinolones I therefore put him back on a week of Cefdinir. X-ray of the stump did not show any bone destruction. We are attempting to get vascular studies done through Dr.Arida's office on May 23. 02/07/17; arterial studies are booked for May 23 no major change. He has completed his antibiotics 02/14/17 arterial  studies on Thursday 5/23. using collagen 02/21/17; the patient had his arterial studies. Both his ABIs were good at 1.0 on the right and 1.1 on the left. His abdominal and iliac arteries revealed atherosclerosis without focal stenosis duplex imaging of the right lower leg revealed heterogeneous plaque and biphasic Doppler waveforms from the right CFA to the popliteal artery. The right distal SFA and tibial peroneal trunk velocities were elevated. Overall the patient had 30-49% right  distal SFA stenosis. The proximal right ATA and PTA were patent above the amputation level. We have been using Silver collagen with some improvement in the wound dimensions 02/28/17; we went ahead and did TCOM measurements on this patient. One of the leads was nonfunctional however on the right leg both the proximal and periwound leads registered greater than 70 mmHg oxygen tension. Interestingly the comparison lead on the left leg/relative wound level was in the mid 40s. This allays any fears I had about contributing wound ischemia. In the meantime is measurements today were 0.2 x 0.4 x 0.7 the wound is now a small divot most of it appears to be epithelialized with only a small open area at that depth. There is no evidence of surrounding infection 03/07/17; continued improvement down to 0.3 cm in depth. This is a very tiny circular opening. No drainage no tenderness. Looks like it is been progressing towards closure. He has been using Silver collagen although I think this is too small to use this as a primary dressing. 03/14/17; still 0.3 cm in depth tiny circular wound. No drainage or tenderness. Used Iodosorb ointment starting last week 03/21/17; still the depth and this wound. Using a #3 curet I remove necrotic material from the wound base. There is a divot here. Not making enough improvement on the Iodosorb, I change back to silver collagen this week. 03/28/17; depth down to 0.2. I cannot actually see a surface of this wound. We've been using silver collagen which I will continue 04/04/17 on evaluation today patient appears to be doing very well. In fact his wound appears to be completely healed though there is a slight eschar over the area I see no fluctuance underneath and I am reluctant to attempt scraping on the area due to the very center superficial nature of the eschar. Patient has no discomfort. 04/11/17; the patient went back to [facility] to his prosthetist however his leg was too  swollen on the right deficient into the prosthesis. The wound itself on the tip of his stump on the right is closed and was close last week. I see no reason to be Andrew Lyons, Andrew T. ([id number]) concerned about this. READMISSION 10/25/17; this is a patient that we had in clinic from April to July 2018 with a probing tiny open area on his right below sided below-knee amputation site. X-ray of this area did not show osteomyelitis. I had some concern about this being ischemic for a while although we did ABIs and T Coms that did a lot to allay these concerns. The patient is a type II diabetic although I don't have a recent hemoglobin A1c. He tells me that after he left the clinic last time he got a new prosthesis, not the prosthesis that originally caused wounds in this area. He states about a month ago used noted some dark discoloration in his skin or at least his wife did when he was getting out of the shower. He since has noted increasing pain and tenderness when he walks around this area. He arrives  in clinic with no open wound 11/01/17; small probing wound on the medial aspect of the stump from a distal amputation site remotely. Surprisingly culture I did last week showed gram-positive cocci but did not culture. X-ray showed edema but no bony abnormality no soft tissue gas etc. we've been using silver alginate. He is offloading this using a scooter 11/08/17; small probing wound on the medial aspect of his stump from a distal amputation done remotely. He arrived in here with a small probing hold the did not go to bone. Culture I did of the discharged from here was negative. Nevertheless I gave him a week's worth of Augmentin wondering whether this could've been anaerobic. He is not having any pain versus when he came in here. He does have callus on the medial aspect of the stump and in my mind this represents excess pressure on this area when he walks. The area has completely closed once  again READMISSION 05/16/18 This is the third admission to the clinic for this patient who had a very distal below-knee amputation many years ago. She uses a modified prosthesis. We have had problems with recurrent wounds in the medial part of the stump which is a small area originally his distal tibia. We've been able to get this to close on 2 occasions. He tells me that this reopened about 2 months ago. Since then he's been "doctoring on it" himself I think packing it with collagen and covering it with alginate he had left over from previous visits. Previous plain x-rays of this area have not shown soft tissue or bone issues on the stump.he tells me he went to see vein and vascular in [place] in follow-up of this area although I don't have this in my notes I'll need to research this in Epic. By his description he says that the blood flow on the right was better than the left. He is a poorly controlled diabetic with a recent hemoglobin A1c of 9 last month he has not yet on insulin. On 02/17/17 he had arterial studies. These were generally quite good with an ABI on the right of 1 on the left of 1.1. Duplex imaging with color Doppler of the right lower leg showed heterogeneous plaque and biphasic Doppler waveforms from the right SFA to the popliteal artery. Right distal SFA and tibial peroneal trunk velocities were elevated. He was felt to have a 30- 49% distal SFA stenosis however the right ATA and PTA were felt to be patent above the amputation level. I do not have that he actually saw a vascular surgeon I'll need to research this in Epic E May have actually been referring to the test just quoted 05/23/18; x-ray I did last week was negative for osteomyelitis. Culture the wound that probed to bone last time was negative [but not a bone culture]. We've been using silver alginate packing. This is now the third occasion that this wound has opened in precisely the same area with precisely the same  appearance. The patient and his wife are somewhat frustrated by this. They have made all modifications to his prosthetic to offload this area. Nevertheless he develops callus with nonviable tissue underneath and a reopening of this wound. I really do not believe that he has underlying osteomyelitis although at this point I am going to have to rule that out. I've ordered an unenhanced CT scan of the bone underneath his distal amputation. He also has some degree of PAD based on arterial studies done on 02/16/17.  At that point duplex imaging suggested that the abdominal aorta and iliac vessels showed atherosclerosis without stenosis. There was heterogeneous plaque with biphasic Doppler waveforms in the right CFA to the popliteal artery right distal left SFA and tibial peroneal trunk velocities were elevated. I did a TCOM at that time that showed good results. We did get the wound to heal at that time. The wound remained closed for about 6 months only to reopen recently. 05/30/18; the patient has his consult with Dr. Fletcher Anon on 06/04/18. his CT scan of the bone on the amputation site is apparently booked for 06/14/18. We are going to look over all of this to make sure there isn't a contributing factor of the recurrent breakdown in this area other than the prosthesis and the resultant pressure. This is the third recurrence of the wound and the precisely the same area. On presentation this goes down to bone. We've been using silver alginate packing 06/06/18; the patient is going for an angiogram on September 25 by Dr. Fletcher Anon. He has a CT scan on September 19. Using silver alginate. Depth of the wound is down to 0.5 cm 06/13/18; angiogram on September 25 and CT scan of the lower leg tomorrow. He is using silver alginate depth of the wound measured at 0.8 cm today. This is after aggressive debridement Andrew Lyons, Andrew Lyons ([id number]) Objective Constitutional Sitting or standing Blood Pressure is within target range  for patient.. Pulse regular and within target range for patient.Marland Kitchen Respirations regular, non-labored and within target range.. Temperature is normal and within the target range for the patient.Marland Kitchen appears in no distress. Vitals Time Taken: 10:20 AM, Height: 70 in, Weight: 264.5 lbs, BMI: 37.9, Temperature: 98.2 F, Pulse: 58 bpm, Respiratory Rate: 16 breaths/min, Blood Pressure: 136/62 mmHg. General Notes: when examined; once again the wound is provided of callus and subcutaneous tissue and debris from the wound bed. This is done with a #3 curet. Hemostasis with direct pressure. Integumentary (Hair, Skin) Wound #3 status is Open. Original cause of wound was Pressure Injury. 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 The wound measures 0.4cm length x 0.4cm width x 0.7cm depth; 0.126cm^2 area and 0.088cm^3 volume. There is Fat Layer (Subcutaneous Tissue) Exposed exposed. There is no tunneling or undermining noted. There is a none present amount of drainage noted. The wound margin is distinct with the outline attached to the wound base. There is no granulation within the wound bed. There is a large (67-100%) amount of necrotic tissue within the wound bed including Eschar. The periwound skin appearance exhibited: Callus. The periwound skin appearance did not exhibit: Crepitus, Excoriation, Induration, Rash, Scarring, Dry/Scaly, Maceration, Atrophie Blanche, Cyanosis, Ecchymosis, Hemosiderin Staining, Mottled, Pallor, Rubor, Erythema. Periwound temperature was noted as No Abnormality. The periwound has tenderness on palpation. 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 There was a Excisional Skin/Subcutaneous Tissue Debridement with a total area of 0.16 sq cm performed by [name], MD. With the following instrument(s): Curette to remove Viable and Non-Viable tissue/material. Material removed includes Callus and Subcutaneous Tissue and after achieving pain control using Other (lidocaine 4%). No specimens were taken.  A time out was conducted at 10:45, prior to the start of the procedure. A Minimum amount of bleeding was controlled with Pressure. The procedure was tolerated well. Post Debridement BRENTON, JOINES ([id number]) Measurements: 0.4cm length x 0.4cm width x 0.7cm depth; 0.088cm^3 volume. Post debridement Stage noted as Category/Stage III. Character of Wound/Ulcer Post Debridement requires further debridement. Severity of Tissue Post Debridement is: Fat layer exposed. Post procedure Diagnosis Wound #3: Same as Pre-Procedure Plan Wound Cleansing: Wound #3 Right Amputation Site - Below Knee: Clean wound with Normal Saline. May Shower, gently pat wound dry prior to applying new dressing. Anesthetic (add to Medication List): Wound #3 Right Amputation Site - Below Knee: Topical Lidocaine 4% cream applied to wound bed prior to debridement (In Clinic Only). 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 Follow-up Appointments: Wound #3 Right Amputation Site - Below Knee: Return Appointment in 1 week. Off-Loading: Wound #3 Right Amputation Site - Below Knee: Other: - do not wear your prosthesis #1 still silver alginate #2 await CT scan tomorrow and angiogram on the 25th. he may ultimately need to see orthopedics about a revision of  the stump Electronic Signature(s) Signed: 06/13/2018 4:51:54 PM By: Linton Ham MD Entered By: Linton Ham on 06/13/2018 11:38:26 Andrew Lyons ([id number]) -------------------------------------------------------------------------------- SuperBill Details Patient Name: Andrew Lyons Date of Service: 06/13/2018 Medical Record Number: [id number] Patient Account Number: [id number] Date of Birth/Sex: 1945/10/27 (72 y.o. 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, right lower leg, subsequent encounter Facility Procedures CPT4 Code: [id number] Description: 16384 - DEB SUBQ TISSUE 20 SQ CM/< ICD-10 Diagnosis Description T36.468 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

## 2018-06-18 ENCOUNTER — Telehealth: Payer: Self-pay | Admitting: Family Medicine

## 2018-06-18 NOTE — Telephone Encounter (Signed)
Pt's wife checking to see if office has some samples of JARDIANCE 25 MG TABS tablet   to give to the pt since it is so expensive.  Please advise.  Thanks, [employer]

## 2018-06-19 NOTE — Telephone Encounter (Signed)
Ok to give samples? Please advise. Thanks!  

## 2018-06-19 NOTE — Telephone Encounter (Signed)
Advised wife. Samples placed up front.

## 2018-06-19 NOTE — Telephone Encounter (Signed)
ok 

## 2018-06-20 ENCOUNTER — Encounter (HOSPITAL_COMMUNITY): Payer: Self-pay | Admitting: Cardiovascular Disease

## 2018-06-20 ENCOUNTER — Other Ambulatory Visit: Payer: Self-pay

## 2018-06-20 ENCOUNTER — Encounter (HOSPITAL_COMMUNITY)
Admission: RE | Disposition: A | Discharge status: Home / Self Care | Payer: Self-pay | Source: Ambulatory Visit | Attending: Cardiovascular Disease

## 2018-06-20 ENCOUNTER — Ambulatory Visit (HOSPITAL_COMMUNITY)
Admission: RE | Admit: 2018-06-20 | Discharge: 2018-06-20 | Disposition: A | Discharge status: Home / Self Care | Payer: PPO | Source: Ambulatory Visit | Attending: Cardiovascular Disease | Admitting: Cardiovascular Disease

## 2018-06-20 DIAGNOSIS — Z955 Presence of coronary angioplasty implant and graft: Secondary | ICD-10-CM | POA: Diagnosis not present

## 2018-06-20 DIAGNOSIS — I70238 Atherosclerosis of native arteries of right leg with ulceration of other part of lower right leg: Secondary | ICD-10-CM | POA: Diagnosis not present

## 2018-06-20 DIAGNOSIS — J449 Chronic obstructive pulmonary disease, unspecified: Secondary | ICD-10-CM | POA: Insufficient documentation

## 2018-06-20 DIAGNOSIS — I10 Essential (primary) hypertension: Secondary | ICD-10-CM | POA: Diagnosis not present

## 2018-06-20 DIAGNOSIS — L97919 Non-pressure chronic ulcer of unspecified part of right lower leg with unspecified severity: Secondary | ICD-10-CM | POA: Diagnosis not present

## 2018-06-20 DIAGNOSIS — Z89431 Acquired absence of right foot: Secondary | ICD-10-CM | POA: Diagnosis not present

## 2018-06-20 DIAGNOSIS — Z79899 Other long term (current) drug therapy: Secondary | ICD-10-CM | POA: Diagnosis not present

## 2018-06-20 DIAGNOSIS — Z9889 Other specified postprocedural states: Secondary | ICD-10-CM | POA: Diagnosis not present

## 2018-06-20 DIAGNOSIS — I70239 Atherosclerosis of native arteries of right leg with ulceration of unspecified site: Secondary | ICD-10-CM | POA: Diagnosis not present

## 2018-06-20 DIAGNOSIS — Z7984 Long term (current) use of oral hypoglycemic drugs: Secondary | ICD-10-CM | POA: Insufficient documentation

## 2018-06-20 DIAGNOSIS — E785 Hyperlipidemia, unspecified: Secondary | ICD-10-CM | POA: Diagnosis not present

## 2018-06-20 DIAGNOSIS — Z888 Allergy status to other drugs, medicaments and biological substances status: Secondary | ICD-10-CM | POA: Insufficient documentation

## 2018-06-20 DIAGNOSIS — G4730 Sleep apnea, unspecified: Secondary | ICD-10-CM | POA: Diagnosis not present

## 2018-06-20 DIAGNOSIS — L97811 Non-pressure chronic ulcer of other part of right lower leg limited to breakdown of skin: Secondary | ICD-10-CM | POA: Diagnosis not present

## 2018-06-20 DIAGNOSIS — Z833 Family history of diabetes mellitus: Secondary | ICD-10-CM | POA: Diagnosis not present

## 2018-06-20 DIAGNOSIS — I252 Old myocardial infarction: Secondary | ICD-10-CM | POA: Diagnosis not present

## 2018-06-20 DIAGNOSIS — Z7982 Long term (current) use of aspirin: Secondary | ICD-10-CM | POA: Diagnosis not present

## 2018-06-20 DIAGNOSIS — Z87891 Personal history of nicotine dependence: Secondary | ICD-10-CM | POA: Insufficient documentation

## 2018-06-20 DIAGNOSIS — Z8249 Family history of ischemic heart disease and other diseases of the circulatory system: Secondary | ICD-10-CM | POA: Insufficient documentation

## 2018-06-20 DIAGNOSIS — E1151 Type 2 diabetes mellitus with diabetic peripheral angiopathy without gangrene: Secondary | ICD-10-CM | POA: Diagnosis not present

## 2018-06-20 DIAGNOSIS — I739 Peripheral vascular disease, unspecified: Secondary | ICD-10-CM

## 2018-06-20 DIAGNOSIS — Z7951 Long term (current) use of inhaled steroids: Secondary | ICD-10-CM | POA: Diagnosis not present

## 2018-06-20 DIAGNOSIS — Z91041 Radiographic dye allergy status: Secondary | ICD-10-CM | POA: Diagnosis not present

## 2018-06-20 DIAGNOSIS — Z953 Presence of xenogenic heart valve: Secondary | ICD-10-CM | POA: Insufficient documentation

## 2018-06-20 DIAGNOSIS — E11621 Type 2 diabetes mellitus with foot ulcer: Secondary | ICD-10-CM | POA: Diagnosis not present

## 2018-06-20 DIAGNOSIS — I7092 Chronic total occlusion of artery of the extremities: Secondary | ICD-10-CM | POA: Diagnosis not present

## 2018-06-20 DIAGNOSIS — I714 Abdominal aortic aneurysm, without rupture: Secondary | ICD-10-CM | POA: Diagnosis not present

## 2018-06-20 HISTORY — PX: ABDOMINAL AORTOGRAM W/LOWER EXTREMITY: CATH118223

## 2018-06-20 HISTORY — PX: PERIPHERAL VASCULAR INTERVENTION: CATH118257

## 2018-06-20 LAB — POCT ACTIVATED CLOTTING TIME
ACTIVATED CLOTTING TIME: 268 s
Activated Clotting Time: 263 seconds

## 2018-06-20 LAB — GLUCOSE, CAPILLARY
GLUCOSE-CAPILLARY: 176 mg/dL — AB (ref 70–99)
GLUCOSE-CAPILLARY: 141 mg/dL — AB (ref 70–99)

## 2018-06-20 SURGERY — ABDOMINAL AORTOGRAM W/LOWER EXTREMITY
Anesthesia: LOCAL | Laterality: Right

## 2018-06-20 MED ORDER — SODIUM CHLORIDE 0.9 % WEIGHT BASED INFUSION
3.0000 mL/kg/h | INTRAVENOUS | Status: AC
  Administered 2018-06-20: 3 mL/kg/h via INTRAVENOUS

## 2018-06-20 MED ORDER — HEPARIN (PORCINE) IN NACL 1000-0.9 UT/500ML-% IV SOLN
INTRAVENOUS | Status: AC
  Filled 2018-06-20: qty 1000

## 2018-06-20 MED ORDER — VIPERSLIDE LUBRICANT OPTIME
TOPICAL | Status: DC | PRN
  Administered 2018-06-20: 10:00:00 via SURGICAL_CAVITY

## 2018-06-20 MED ORDER — ASPIRIN EC 81 MG PO TBEC: 81.0000 mg | DELAYED_RELEASE_TABLET | Freq: Every day | ORAL | 2 refills | Status: AC

## 2018-06-20 MED ORDER — NITROGLYCERIN 1 MG/10 ML FOR IR/CATH LAB
INTRA_ARTERIAL | Status: DC | PRN
  Administered 2018-06-20: 200 ug via INTRA_ARTERIAL

## 2018-06-20 MED ORDER — CLOPIDOGREL BISULFATE 300 MG PO TABS
ORAL_TABLET | ORAL | Status: AC
  Filled 2018-06-20: qty 1

## 2018-06-20 MED ORDER — SODIUM CHLORIDE 0.9% FLUSH: 3.0000 mL | Freq: Two times a day (BID) | INTRAVENOUS | Status: DC

## 2018-06-20 MED ORDER — SODIUM CHLORIDE 0.9% FLUSH: 3.0000 mL | INTRAVENOUS | Status: DC | PRN

## 2018-06-20 MED ORDER — FENTANYL CITRATE (PF) 100 MCG/2ML IJ SOLN
INTRAMUSCULAR | Status: AC
  Filled 2018-06-20: qty 2

## 2018-06-20 MED ORDER — CLOPIDOGREL BISULFATE 300 MG PO TABS
ORAL_TABLET | ORAL | Status: DC | PRN
  Administered 2018-06-20: 300 mg via ORAL

## 2018-06-20 MED ORDER — FENTANYL CITRATE (PF) 100 MCG/2ML IJ SOLN
INTRAMUSCULAR | Status: DC | PRN
  Administered 2018-06-20 (×3): 25 ug via INTRAVENOUS

## 2018-06-20 MED ORDER — NITROGLYCERIN IN D5W 200-5 MCG/ML-% IV SOLN
INTRAVENOUS | Status: AC
  Filled 2018-06-20: qty 250

## 2018-06-20 MED ORDER — MIDAZOLAM HCL 2 MG/2ML IJ SOLN
INTRAMUSCULAR | Status: DC | PRN
  Administered 2018-06-20 (×3): 1 mg via INTRAVENOUS

## 2018-06-20 MED ORDER — ONDANSETRON HCL 4 MG/2ML IJ SOLN: 4.0000 mg | Freq: Four times a day (QID) | INTRAMUSCULAR | Status: DC | PRN

## 2018-06-20 MED ORDER — SODIUM CHLORIDE 0.9 % IV SOLN: 250.0000 mL | INTRAVENOUS | Status: DC | PRN

## 2018-06-20 MED ORDER — VERAPAMIL HCL 2.5 MG/ML IV SOLN
INTRAVENOUS | Status: AC
  Filled 2018-06-20: qty 2

## 2018-06-20 MED ORDER — ACETAMINOPHEN 325 MG PO TABS: 650.0000 mg | ORAL_TABLET | ORAL | Status: DC | PRN

## 2018-06-20 MED ORDER — SODIUM CHLORIDE 0.9 % IV SOLN: INTRAVENOUS | Status: DC

## 2018-06-20 MED ORDER — MIDAZOLAM HCL 2 MG/2ML IJ SOLN
INTRAMUSCULAR | Status: AC
  Filled 2018-06-20: qty 2

## 2018-06-20 MED ORDER — LIDOCAINE HCL (PF) 1 % IJ SOLN
INTRAMUSCULAR | Status: DC | PRN
  Administered 2018-06-20: 20 mL via INTRADERMAL

## 2018-06-20 MED ORDER — SODIUM CHLORIDE 0.9 % WEIGHT BASED INFUSION
1.0000 mL/kg/h | INTRAVENOUS | Status: DC
  Administered 2018-06-20: 250 mL via INTRAVENOUS

## 2018-06-20 MED ORDER — HEPARIN SODIUM (PORCINE) 1000 UNIT/ML IJ SOLN
INTRAMUSCULAR | Status: DC | PRN
  Administered 2018-06-20: 12000 [IU] via INTRAVENOUS
  Administered 2018-06-20: 2000 [IU] via INTRAVENOUS

## 2018-06-20 MED ORDER — ASPIRIN 81 MG PO CHEW: 81.0000 mg | CHEWABLE_TABLET | ORAL | Status: DC

## 2018-06-20 MED ORDER — HEPARIN (PORCINE) IN NACL 1000-0.9 UT/500ML-% IV SOLN
INTRAVENOUS | Status: DC | PRN
  Administered 2018-06-20 (×2): 500 mL

## 2018-06-20 MED ORDER — LIDOCAINE HCL (PF) 1 % IJ SOLN
INTRAMUSCULAR | Status: AC
  Filled 2018-06-20: qty 30

## 2018-06-20 MED ORDER — CLOPIDOGREL BISULFATE 75 MG PO TABS: 75.0000 mg | ORAL_TABLET | Freq: Every day | ORAL | 6 refills | Status: DC

## 2018-06-20 MED ORDER — IODIXANOL 320 MG/ML IV SOLN
INTRAVENOUS | Status: DC | PRN
  Administered 2018-06-20: 165 mL via INTRA_ARTERIAL

## 2018-06-20 SURGICAL SUPPLY — 31 items
BAG SNAP BAND KOVER 36X36 (MISCELLANEOUS) ×3 IMPLANT
BALLN COYOTE ES OTW 4X40X145 (BALLOONS) ×3
BALLN LUTONIX DCB 5X60X130 (BALLOONS) ×6
BALLOON COYOTE ES OTW 4X40X145 (BALLOONS) ×2 IMPLANT
BALLOON LUTONIX DCB 5X60X130 (BALLOONS) ×4 IMPLANT
CATH ANGIO 5F PIGTAIL 65CM (CATHETERS) ×3 IMPLANT
CATH CROSS OVER TEMPO 5F (CATHETERS) ×3 IMPLANT
CATH QUICKCROSS .018X135CM (MICROCATHETER) ×3 IMPLANT
CATH STRAIGHT 5FR 65CM (CATHETERS) ×3 IMPLANT
DEVICE CLOSURE MYNXGRIP 6/7F (Vascular Products) ×3 IMPLANT
DEVICE EMBOSHIELD NAV6 4.0-7.0 (FILTER) ×3 IMPLANT
DIAMONDBACK SOLID OAS 1.5MM (CATHETERS) ×3
KIT ENCORE 26 ADVANTAGE (KITS) ×3 IMPLANT
KIT MICROPUNCTURE NIT STIFF (SHEATH) ×3 IMPLANT
KIT PV (KITS) ×3 IMPLANT
LUBRICANT VIPERSLIDE CORONARY (MISCELLANEOUS) ×3 IMPLANT
SHEATH PINNACLE 5F 10CM (SHEATH) ×3 IMPLANT
SHEATH PINNACLE 6F 10CM (SHEATH) ×3 IMPLANT
SHEATH PINNACLE ST 6F 45CM (SHEATH) ×3 IMPLANT
SHEATH PROBE COVER 6X72 (BAG) ×3 IMPLANT
SHIELD RADPAD SCOOP 12X17 (MISCELLANEOUS) ×3 IMPLANT
STOPCOCK MORSE 400PSI 3WAY (MISCELLANEOUS) ×3 IMPLANT
SYRINGE MEDRAD AVANTA MACH 7 (SYRINGE) ×3 IMPLANT
SYSTEM DIMNDBCK SLD OAS 1.5MM (CATHETERS) ×2 IMPLANT
TAPE VIPERTRACK RADIOPAQ (MISCELLANEOUS) ×2 IMPLANT
TAPE VIPERTRACK RADIOPAQUE (MISCELLANEOUS) ×1
TRANSDUCER W/STOPCOCK (MISCELLANEOUS) ×3 IMPLANT
TRAY PV CATH (CUSTOM PROCEDURE TRAY) ×3 IMPLANT
TUBING CIL FLEX 10 FLL-RA (TUBING) ×3 IMPLANT
WIRE HITORQ VERSACORE ST 145CM (WIRE) ×3 IMPLANT
WIRE VIPER ADVANCE .017X335CM (WIRE) ×3 IMPLANT

## 2018-06-20 NOTE — Interval H&P Note (Signed)
History and Physical Interval Note:  06/20/2018 8:25 AM  Andrew Lyons  has presented today for surgery, with the diagnosis of pad  The various methods of treatment have been discussed with the patient and family. After consideration of risks, benefits and other options for treatment, the patient has consented to  Procedure(s): ABDOMINAL AORTOGRAM W/LOWER EXTREMITY (N/A) as a surgical intervention .  The patient's history has been reviewed, patient examined, no change in status, stable for surgery.  I have reviewed the patient's chart and labs.  Questions were answered to the patient's satisfaction.     Kathlyn [place]

## 2018-06-20 NOTE — Discharge Instructions (Signed)

## 2018-06-21 ENCOUNTER — Telehealth: Payer: Self-pay | Admitting: *Deleted

## 2018-06-21 DIAGNOSIS — I739 Peripheral vascular disease, unspecified: Secondary | ICD-10-CM

## 2018-06-21 NOTE — Telephone Encounter (Signed)
Patient called and made aware to have lab work Artist) done tomorrow post angiogram. Orders have also been placed for a post angiogram LEA and ABI.

## 2018-06-22 DIAGNOSIS — I739 Peripheral vascular disease, unspecified: Secondary | ICD-10-CM | POA: Diagnosis not present

## 2018-06-23 LAB — BASIC METABOLIC PANEL
BUN/Creatinine Ratio: 20 (ref 10–24)
BUN: 25 mg/dL (ref 8–27)
CHLORIDE: 102 mmol/L (ref 96–106)
CO2: 18 mmol/L — AB (ref 20–29)
Calcium: 9.4 mg/dL (ref 8.6–10.2)
Creatinine, Ser: 1.22 mg/dL (ref 0.76–1.27)
GFR calc Af Amer: 69 mL/min/{1.73_m2} (ref >59)
GFR calc non Af Amer: 59 mL/min/{1.73_m2} — ABNORMAL LOW (ref >59)
GLUCOSE: 163 mg/dL — AB (ref 65–99)
POTASSIUM: 5.1 mmol/L (ref 3.5–5.2)
SODIUM: 141 mmol/L (ref 134–144)

## 2018-06-27 ENCOUNTER — Encounter: Payer: PPO | Attending: Internal Medicine | Admitting: Internal Medicine

## 2018-06-27 DIAGNOSIS — E1151 Type 2 diabetes mellitus with diabetic peripheral angiopathy without gangrene: Secondary | ICD-10-CM | POA: Insufficient documentation

## 2018-06-27 DIAGNOSIS — Z89511 Acquired absence of right leg below knee: Secondary | ICD-10-CM | POA: Diagnosis not present

## 2018-06-27 DIAGNOSIS — Z872 Personal history of diseases of the skin and subcutaneous tissue: Secondary | ICD-10-CM | POA: Diagnosis not present

## 2018-06-27 DIAGNOSIS — L89890 Pressure ulcer of other site, unstageable: Secondary | ICD-10-CM | POA: Diagnosis not present

## 2018-06-27 DIAGNOSIS — Z09 Encounter for follow-up examination after completed treatment for conditions other than malignant neoplasm: Secondary | ICD-10-CM | POA: Insufficient documentation

## 2018-06-27 DIAGNOSIS — E11622 Type 2 diabetes mellitus with other skin ulcer: Secondary | ICD-10-CM | POA: Diagnosis not present

## 2018-06-28 ENCOUNTER — Ambulatory Visit (INDEPENDENT_AMBULATORY_CARE_PROVIDER_SITE_OTHER): Payer: PPO

## 2018-06-28 DIAGNOSIS — I739 Peripheral vascular disease, unspecified: Secondary | ICD-10-CM

## 2018-06-30 NOTE — Progress Notes (Signed)
KOAH, CHISENHALL ([id number]) Visit Report for 06/27/2018 Arrival Information Details Patient Name: Andrew Lyons, Andrew Lyons Date of Service: 06/27/2018 10:15 AM Medical Record Number: [id number] Patient Account Number: [id number] Date of Birth/Sex: Jun 04, 1946 (72 y.o. M) Treating RN: Cornell Barman Primary Care Gaston Dase: [name], Delfino Lovett Other Clinician: Referring Anayeli Arel: [name], [name] Treating Obadiah Dennard/Extender: [name] in Treatment: 6 Visit Information History Since Last Visit Added or deleted any medications: No Patient Arrived: Walker Any new allergies or adverse reactions: No Arrival Time: 10:28 Had a fall or experienced change in No Accompanied By: self activities of daily living that may affect Transfer Assistance: None risk of falls: Patient Identification Verified: Yes Signs or symptoms of abuse/neglect since last visito No Secondary Verification Process Completed: Yes Hospitalized since last visit: No Patient Requires Transmission-Based Precautions: No Implantable device outside of the clinic excluding No Patient Has Alerts: Yes cellular tissue based products placed in the center Patient Alerts: DM II since last visit: Has Dressing in Place as Prescribed: Yes Pain Present Now: No Electronic Signature(s) Signed: 06/27/2018 1:31:10 PM By: Lorine Bears RCP, RRT, CHT Entered By: Lorine Bears on 06/27/2018 10:28:53 Andrew Lyons ([id number]) -------------------------------------------------------------------------------- Clinic Level of Care Assessment Details Patient Name: Andrew Lyons Date of Service: 06/27/2018 10:15 AM Medical Record Number: [id number] Patient Account Number: [id number] Date of Birth/Sex: [birth year] (72 y.o. M) Treating RN: Cornell Barman Primary Care Janin Kozlowski: [name], Delfino Lovett Other Clinician: Referring Vennie Waymire: [name], [name] Treating Taneisha Fuson/Extender: [name]  in Treatment: 6 Clinic Level of Care Assessment Items TOOL 4 Quantity Score []  - Use when only an EandM is performed on FOLLOW-UP visit 0 ASSESSMENTS - Nursing Assessment / Reassessment X - Reassessment of Co-morbidities (includes updates in patient status) 1 10 X- 1 5 Reassessment of Adherence to Treatment Plan ASSESSMENTS - Wound and Skin Assessment / Reassessment X - Simple Wound Assessment / Reassessment - one wound 1 5 []  - 0 Complex Wound Assessment / Reassessment - multiple wounds []  - 0 Dermatologic / Skin Assessment (not related to wound area) ASSESSMENTS - Focused Assessment []  - Circumferential Edema Measurements - multi extremities 0 []  - 0 Nutritional Assessment / Counseling / Intervention []  - 0 Lower Extremity Assessment (monofilament, tuning fork, pulses) []  - 0 Peripheral Arterial Disease Assessment (using hand held doppler) ASSESSMENTS - Ostomy and/or Continence Assessment and Care []  - Incontinence Assessment and Management 0 []  - 0 Ostomy Care Assessment and Management (repouching, etc.) PROCESS - Coordination of Care X - Simple Patient / Family Education for ongoing care 1 15 []  - 0 Complex (extensive) Patient / Family Education for ongoing care []  - 0 Staff obtains Programmer, systems, Records, Test Results / Process Orders []  - 0 Staff telephones HHA, Nursing Homes / Clarify orders / etc []  - 0 Routine Transfer to another Facility (non-emergent condition) []  - 0 Routine Hospital Admission (non-emergent condition) []  - 0 New Admissions / Biomedical engineer / Ordering NPWT, Apligraf, etc. []  - 0 Emergency Hospital Admission (emergent condition) X- 1 10 Simple Discharge Coordination MAYJOR, AGER ([id number]) []  - 0 Complex (extensive) Discharge Coordination PROCESS - Special Needs []  - Pediatric / Minor Patient Management 0 []  - 0 Isolation Patient Management []  - 0 Hearing / Language / Visual special needs []  - 0 Assessment of Community  assistance (transportation, D/C planning, etc.) []  - 0 Additional assistance / Altered mentation []  - 0 Support Surface(s) Assessment (bed, cushion, seat, etc.) INTERVENTIONS - Wound Cleansing / Measurement X -  Simple Wound Cleansing - one wound 1 5 []  - 0 Complex Wound Cleansing - multiple wounds X- 1 5 Wound Imaging (photographs - any number of wounds) []  - 0 Wound Tracing (instead of photographs) X- 1 5 Simple Wound Measurement - one wound []  - 0 Complex Wound Measurement - multiple wounds INTERVENTIONS - Wound Dressings []  - Small Wound Dressing one or multiple wounds 0 []  - 0 Medium Wound Dressing one or multiple wounds []  - 0 Large Wound Dressing one or multiple wounds []  - 0 Application of Medications - topical []  - 0 Application of Medications - injection INTERVENTIONS - Miscellaneous []  - External ear exam 0 []  - 0 Specimen Collection (cultures, biopsies, blood, body fluids, etc.) []  - 0 Specimen(s) / Culture(s) sent or taken to Lab for analysis []  - 0 Patient Transfer (multiple staff / Civil Service fast streamer / Similar devices) []  - 0 Simple Staple / Suture removal (25 or less) []  - 0 Complex Staple / Suture removal (26 or more) []  - 0 Hypo / Hyperglycemic Management (close monitor of Blood Glucose) []  - 0 Ankle / Brachial Index (ABI) - do not check if billed separately X- 1 5 Vital Signs INAKI, VANTINE ([id number]) Has the patient been seen at the hospital within the last three years: Yes Total Score: 65 Level Of Care: New/Established - Level 2 Electronic Signature(s) Signed: 06/28/2018 3:05:38 PM By: Gretta Cool, BSN, RN, CWS, Kim RN, BSN Entered By: Gretta Cool, BSN, RN, CWS, Kim on 06/27/2018 10:48:16 Andrew Lyons ([id number]) -------------------------------------------------------------------------------- Encounter Discharge Information Details Patient Name: Andrew Lyons Date of Service: 06/27/2018 10:15 AM Medical Record Number: [id number] Patient  Account Number: [id number] Date of Birth/Sex: June 07, 1946 (72 y.o. M) Treating RN: Cornell Barman Primary Care Israel Wunder: [name], Delfino Lovett Other Clinician: Referring Jamarkis Branam: [name], [name] Treating Kelvon Giannini/Extender: [name] in Treatment: 6 Encounter Discharge Information Items Discharge Condition: Stable Ambulatory Status: Ambulatory Discharge Destination: Home Transportation: Private Auto Accompanied By: self Schedule Follow-up Appointment: Yes Clinical Summary of Care: Notes knee walker Electronic Signature(s) Signed: 06/28/2018 3:05:38 PM By: Gretta Cool, BSN, RN, CWS, Kim RN, BSN Entered By: Gretta Cool, BSN, RN, CWS, Kim on 06/27/2018 10:49:06 Andrew Lyons ([id number]) -------------------------------------------------------------------------------- Lower Extremity Assessment Details Patient Name: Andrew Lyons Date of Service: 06/27/2018 10:15 AM Medical Record Number: [id number] Patient Account Number: [id number] Date of Birth/Sex: Feb 11, 1946 (72 y.o. M) Treating RN: Secundino Ginger Primary Care Daxten Kovalenko: [name], Delfino Lovett Other Clinician: Referring Toneshia Coello: [name], [name] Treating Aden Sek/Extender: [name] in Treatment: 6 Electronic Signature(s) Signed: 06/27/2018 1:11:45 PM By: Secundino Ginger Entered By: Secundino Ginger on 06/27/2018 10:37:05 Andrew Lyons ([id number]) -------------------------------------------------------------------------------- Multi Wound Chart Details Patient Name: Andrew Lyons Date of Service: 06/27/2018 10:15 AM Medical Record Number: [id number] Patient Account Number: [id number] Date of Birth/Sex: May 25, 1946 (72 y.o. M) Treating RN: Cornell Barman Primary Care Alyha Marines: [name], Delfino Lovett Other Clinician: Referring Rahel Carlton: [name], [name] Treating Modesty Rudy/Extender: [name] in Treatment: 6 Vital Signs Height(in): 70 Pulse(bpm): 65 Weight(lbs): 264.5 Blood Pressure(mmHg):  130/57 Body Mass Index(BMI): 38 Temperature(F): 98.1 Respiratory Rate 16 (breaths/min): Photos: [N/A:N/A] Wound Location: Right Amputation Site - Below N/A N/A Knee Wounding Event: Pressure Injury N/A N/A Primary Etiology: Pressure Ulcer N/A N/A Secondary Etiology: Diabetic Wound/Ulcer of the N/A N/A Lower Extremity Comorbid History: Cataracts, Chronic Obstructive N/A N/A Pulmonary Disease (COPD), Sleep Apnea, Hypotension, Myocardial Infarction, Type II Diabetes, Osteoarthritis, Neuropathy Date Acquired: 02/24/2018 N/A N/A Weeks of Treatment: 6 N/A N/A Wound Status: Healed -  Epithelialized N/A N/A Measurements L x W x D 0x0x0 N/A N/A (cm) Area (cm) : 0 N/A N/A Volume (cm) : 0 N/A N/A % Reduction in Area: 100.00% N/A N/A % Reduction in Volume: 100.00% N/A N/A Classification: Category/Stage III N/A N/A Exudate Amount: None Present N/A N/A Wound Margin: Distinct, outline attached N/A N/A Granulation Amount: None Present (0%) N/A N/A Necrotic Amount: None Present (0%) N/A N/A Exposed Structures: Fat Layer (Subcutaneous N/A N/A Tissue) Exposed: Yes Fascia: No JASHAWN, FLOYD T. ([id number]) Tendon: No Muscle: No Joint: No Bone: No Epithelialization: None N/A N/A Periwound Skin Texture: Callus: Yes N/A N/A Excoriation: No Induration: No Crepitus: No Rash: No Scarring: No Periwound Skin Moisture: Maceration: No N/A N/A Dry/Scaly: No Periwound Skin Color: Atrophie Blanche: No N/A N/A Cyanosis: No Ecchymosis: No Erythema: No Hemosiderin Staining: No Mottled: No Pallor: No Rubor: No Temperature: No Abnormality N/A N/A Tenderness on Palpation: No N/A N/A Wound Preparation: Ulcer Cleansing: N/A N/A Rinsed/Irrigated with Saline Topical Anesthetic Applied: Other: lidocaine 4% Treatment Notes Electronic Signature(s) Signed: 06/28/2018 5:56:52 AM By: Linton Ham MD Entered By: Linton Ham on 06/27/2018 14:43:38 Andrew Lyons  ([id number]) -------------------------------------------------------------------------------- [place] Details Patient Name: Andrew Lyons Date of Service: 06/27/2018 10:15 AM Medical Record Number: [id number] Patient Account Number: [id number] Date of Birth/Sex: 11-05-1945 (72 y.o. M) Treating RN: Cornell Barman Primary Care Tyreshia Ingman: [name], Delfino Lovett Other Clinician: Referring Siearra Amberg: [name], [name] Treating [place] Churchill/Extender: [name] in Treatment: 6 Active Inactive Electronic Signature(s) Signed: 06/28/2018 3:05:38 PM By: Gretta Cool, BSN, RN, CWS, Kim RN, BSN Entered By: Gretta Cool, BSN, RN, CWS, Kim on 06/27/2018 10:47:11 Andrew Lyons ([id number]) -------------------------------------------------------------------------------- Pain Assessment Details Patient Name: Andrew Lyons Date of Service: 06/27/2018 10:15 AM Medical Record Number: [id number] Patient Account Number: [id number] Date of Birth/Sex: 1946/07/05 (72 y.o. M) Treating RN: Cornell Barman Primary Care Kumiko Fishman: [name], Delfino Lovett Other Clinician: Referring Pati Thinnes: [name], [name] Treating Ralphael Southgate/Extender: [name] in Treatment: 6 Active Problems Location of Pain Severity and Description of Pain Patient Has Paino No Site Locations Pain Management and Medication Current Pain Management: Electronic Signature(s) Signed: 06/27/2018 1:31:10 PM By: Lorine Bears RCP, RRT, CHT Signed: 06/28/2018 3:05:38 PM By: Gretta Cool, BSN, RN, CWS, Kim RN, BSN Entered By: Lorine Bears on 06/27/2018 10:29:03 Andrew Lyons ([id number]) -------------------------------------------------------------------------------- Patient/Caregiver Education Details Patient Name: Andrew Lyons Date of Service: 06/27/2018 10:15 AM Medical Record Number: [id number] Patient Account Number: [id number] Date of Birth/Gender: [birth year] (72 y.o. M) Treating  RN: Cornell Barman Primary Care Physician: [name], Delfino Lovett Other Clinician: Referring Physician: [name] Treating Physician/Extender: [name] in Treatment: 6 Education Assessment Education Provided To: Patient Education Topics Provided Pressure: Handouts: Preventing Pressure Ulcers Methods: Demonstration, Explain/Verbal Responses: State content correctly Electronic Signature(s) Signed: 06/28/2018 3:05:38 PM By: Gretta Cool, BSN, RN, CWS, Kim RN, BSN Entered By: Gretta Cool, BSN, RN, CWS, Kim on 06/27/2018 10:48:37 Andrew Lyons ([id number]) -------------------------------------------------------------------------------- Wound Assessment Details Patient Name: Andrew Lyons Date of Service: 06/27/2018 10:15 AM Medical Record Number: [id number] Patient Account Number: [id number] Date of Birth/Sex: [birth year] (72 y.o. 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 Wounding Event: Pressure Injury Etiology: Date Acquired: 02/24/2018 Wound Healed - Epithelialized Weeks  Of Treatment: 6 Status: Clustered Wound: No Comorbid Cataracts, Chronic Obstructive Pulmonary History: Disease (COPD), Sleep Apnea, Hypotension, Myocardial Infarction, Type II Diabetes, Osteoarthritis, Neuropathy Photos Photo Uploaded By: Secundino Ginger on 06/27/2018 10:43:20 Wound Measurements Length: (cm) 0 % Reduc Width: (cm) 0 % Reduc Depth: (cm) 0 Epithel Area: (cm) 0 Tunnel Volume: (cm) 0 Underm tion in Area: 100% tion in Volume: 100% ialization: None ing: No ining: No Wound Description Classification: Category/Stage III Wound Margin: Distinct, outline  attached Exudate Amount: None Present Foul Odor After Cleansing: No Slough/Fibrino No Wound Bed Granulation Amount: None Present (0%) Exposed Structure Necrotic Amount: None Present (0%) Fascia Exposed: No Fat Layer (Subcutaneous Tissue) Exposed: Yes Tendon Exposed: No Muscle Exposed: No Joint Exposed: No Bone Exposed: No Facer, Asencion T. ([id number]) Periwound Skin Texture Texture Color No Abnormalities Noted: No No Abnormalities Noted: No Callus: Yes Atrophie Blanche: No Crepitus: No Cyanosis: No Excoriation: No Ecchymosis: No Induration: No Erythema: No Rash: No Hemosiderin Staining: No Scarring: No Mottled: No Pallor: No Moisture Rubor: No No Abnormalities Noted: No Dry / Scaly: No Temperature / Pain Maceration: No Temperature: No Abnormality Wound Preparation Ulcer Cleansing: Rinsed/Irrigated with Saline Topical Anesthetic Applied: Other: lidocaine 4%, Electronic Signature(s) Signed: 06/28/2018 3:05:38 PM By: Gretta Cool, BSN, RN, CWS, Kim RN, BSN Entered By: Gretta Cool, BSN, RN, CWS, Kim on 06/27/2018 10:46:57 Andrew Lyons ([id number]) -------------------------------------------------------------------------------- Vitals Details Patient Name: Andrew Lyons Date of Service: 06/27/2018 10:15 AM Medical Record Number: [id number] Patient Account Number: [id number] Date of Birth/Sex: 01/16/1946 (72 y.o. M) Treating RN: Cornell Barman Primary Care Kristene Liberati: [name], Delfino Lovett Other Clinician: Referring Maeryn Mcgath: [name], [name] Treating Dorota Heinrichs/Extender: [name] in Treatment: 6 Vital Signs Time Taken: 10:28 Temperature (F): 98.1 Height (in): 70 Pulse (bpm): 65 Weight (lbs): 264.5 Respiratory Rate (breaths/min): 16 Body Mass Index (BMI): 37.9 Blood Pressure (mmHg): 130/57 Reference Range: 80 - 120 mg / dl Electronic Signature(s) Signed: 06/27/2018 1:31:10 PM By: Lorine Bears RCP, RRT, CHT Entered By: Lorine Bears on 06/27/2018 10:31:14

## 2018-06-30 NOTE — Progress Notes (Signed)
Andrew Lyons, Andrew Lyons ([id number]) Visit Report for 06/27/2018 HPI Details Patient Name: Andrew Lyons, Andrew Lyons Date of Service: 06/27/2018 10:15 AM Medical Record Number: [id number] Patient Account Number: [id number] Date of Birth/Sex: 07-Oct-1945 (72 y.o. M) Treating RN: Cornell Barman Primary Care Provider: [name], Delfino Lovett Other Clinician: Referring Provider: [name], [name] Treating Provider/Extender: [name] in Treatment: 6 History of Present Illness HPI Description: 01/04/17; this is a 72 year old diabetic man who has a remote history of a traumatic lower extremity damage at age 55 requiring an amputation. He tells me spent 2 years walking on crutches then ultimately has been walking on prosthesis without any trouble since then. Several months ago he had a new prosthesis and developed an open area in the right stump in December. He saw podiatry Dr. Prudence Davidson who noted that this was really out of his practice jurisdiction and referred him here. He has been using mupirocin. He has continued to walk on the prosthesis. The prosthesis itself as been adjusted by the prostatitis. He does not have a known arterial issue. He tells me he probably has diabetic neuropathy. He had a wound that took a long time to heal surrounding the actual amputation itself however is not had more recent wounds. 01/10/17 Culture from last week grew pseudomonas. change AB to cefdinir yesterday from doxy. he is not using his prosthesis which I emphasized although he is having it adjusted next week 01/17/17; he is completing his antibiotics in the next day or 2. We've been using silver alginate. His prosthesis is away being readjusted. He had an x-ray of the underlying bone here apparently at his podiatrist's office although I'll need to have a look at that and we can't find the result then he may need this re-x-rayed. 01/24/17; he has completed his antibiotics. Change to Silver collagen last week. No real change  in the wound this week. 01/31/17 repeat culture I did last week showed a few Pseudomonas. This only has intermediate sensitivity to quinolones I therefore put him back on a week of Cefdinir. X-ray of the stump did not show any bone destruction. We are attempting to get vascular studies done through Dr.Arida's office on May 23. 02/07/17; arterial studies are booked for May 23 no major change. He has completed his antibiotics 02/14/17 arterial studies on Thursday 5/23. using collagen 02/21/17; the patient had his arterial studies. Both his ABIs were good at 1.0 on the right and 1.1 on the left. His abdominal and iliac arteries revealed atherosclerosis without focal stenosis duplex imaging of the right lower leg revealed heterogeneous plaque and biphasic Doppler waveforms from the right CFA to the popliteal artery. The right distal SFA and tibial peroneal trunk velocities were elevated. 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 The proximal right ATA and PTA were patent above the amputation level. We have been using Silver collagen with some improvement in the wound dimensions 02/28/17; we went ahead and did TCOM measurements on this patient. One of the leads was nonfunctional however on the right leg both the proximal and periwound leads registered greater than 70 mmHg oxygen tension. Interestingly the comparison lead on the left leg/relative wound level was in the mid 40s. This allays any fears I had about contributing wound ischemia. In the meantime is measurements today were 0.2 x 0.4 x 0.7 the wound is now a small divot most of it appears to be epithelialized with only a small open area at that depth. There is no evidence of surrounding infection  03/07/17; continued improvement down to 0.3 cm in depth. This is a very tiny circular opening. No drainage no tenderness. Looks like it is been progressing towards closure. He has been using Silver collagen although I think this is too small to  use this as a primary dressing. 03/14/17; still 0.3 cm in depth tiny circular wound. No drainage or tenderness. Used Iodosorb ointment starting last week 03/21/17; still the depth and this wound. Using a #3 curet I remove necrotic material from the wound base. There is a divot here. Not making enough improvement on the Iodosorb, I change back to silver collagen this week. 03/28/17; depth down to 0.2. I cannot actually see a surface of this wound. We've been using silver collagen which I will continue 04/04/17 on evaluation today patient appears to be doing very well. In fact his wound appears to be completely healed though there is a slight eschar over the area I see no fluctuance underneath and I am reluctant to attempt scraping on the area due to the very center superficial nature of the eschar. Patient has no discomfort. Andrew Lyons, Andrew Lyons ([id number]) 04/11/17; the patient went back to [facility] to his prosthetist however his leg was too swollen on the right deficient into the prosthesis. The wound itself on the tip of his stump on the right is closed and was close last week. I see no reason to be concerned about this. READMISSION 10/25/17; this is a patient that we had in clinic from April to July 2018 with a probing tiny open area on his right below sided below-knee amputation site. X-ray of this area did not show osteomyelitis. I had some concern about this being ischemic for a while although we did ABIs and T Coms that did a lot to allay these concerns. The patient is a type II diabetic although I don't have a recent hemoglobin A1c. He tells me that after he left the clinic last time he got a new prosthesis, not the prosthesis that originally caused wounds in this area. He states about a month ago used noted some dark discoloration in his skin or at least his wife did when he was getting out of the shower. He since has noted increasing pain and tenderness when he walks around this area. He  arrives in clinic with no open wound 11/01/17; small probing wound on the medial aspect of the stump from a distal amputation site remotely. Surprisingly culture I did last week showed gram-positive cocci but did not culture. X-ray showed edema but no bony abnormality no soft tissue gas etc. we've been using silver alginate. He is offloading this using a scooter 11/08/17; small probing wound on the medial aspect of his stump from a distal amputation done remotely. He arrived in here with a small probing hold the did not go to bone. Culture I did of the discharged from here was negative. Nevertheless I gave him a week's worth of Augmentin wondering whether this could've been anaerobic. He is not having any pain versus when he came in here. He does have callus on the medial aspect of the stump and in my mind this represents excess pressure on this area when he walks. The area has completely closed once again READMISSION 05/16/18 This is the third admission to the clinic for this patient who had a very distal below-knee amputation many years ago. She uses a modified prosthesis. We have had problems with recurrent wounds in the medial part of the stump which is a small  area originally his distal tibia. We've been able to get this to close on 2 occasions. He tells me that this reopened about 2 months ago. Since then he's been "doctoring on it" himself I think packing it with collagen and covering it with alginate he had left over from previous visits. Previous plain x-rays of this area have not shown soft tissue or bone issues on the stump.he tells me he went to see vein and vascular in [place] in follow-up of this area although I don't have this in my notes I'll need to research this in Epic. By his description he says that the blood flow on the right was better than the left. He is a poorly controlled diabetic with a recent hemoglobin A1c of 9 last month he has not yet on insulin. On 02/17/17 he had  arterial studies. These were generally quite good with an ABI on the right of 1 on the left of 1.1. Duplex imaging with color Doppler of the right lower leg showed heterogeneous plaque and biphasic Doppler waveforms from the right SFA to the popliteal artery. Right distal SFA and tibial peroneal trunk velocities were elevated. He was felt to have a 30- 49% distal SFA stenosis however the right ATA and PTA were felt to be patent above the amputation level. I do not have that he actually saw a vascular surgeon I'll need to research this in Epic E May have actually been referring to the test just quoted 05/23/18; x-ray I did last week was negative for osteomyelitis. Culture the wound that probed to bone last time was negative [but not a bone culture]. We've been using silver alginate packing. This is now the third occasion that this wound has opened in precisely the same area with precisely the same appearance. The patient and his wife are somewhat frustrated by this. They have made all modifications to his prosthetic to offload this area. Nevertheless he develops callus with nonviable tissue underneath and a reopening of this wound. I really do not believe that he has underlying osteomyelitis although at this point I am going to have to rule that out. I've ordered an unenhanced CT scan of the bone underneath his distal amputation. He also has some degree of PAD based on arterial studies done on 02/16/17. At that point duplex imaging suggested that the abdominal aorta and iliac vessels showed atherosclerosis without stenosis. There was heterogeneous plaque with biphasic Doppler waveforms in the right CFA to the popliteal artery right distal left SFA and tibial peroneal trunk velocities were elevated. I did a TCOM at that time that showed good results. We did get the wound to heal at that time. The wound remained closed for about 6 months only to reopen recently. 05/30/18; the patient has his consult with  Dr. Fletcher Anon on 06/04/18. his CT scan of the bone on the amputation site is apparently booked for 06/14/18. We are going to look over all of this to make sure there isn't a contributing factor of the recurrent breakdown in this area other than the prosthesis and the resultant pressure. This is the third recurrence of the wound and the precisely the same area. On presentation this goes down to bone. We've been using silver alginate packing 06/06/18; the patient is going for an angiogram on September 25 by Dr. Fletcher Anon. He has a CT scan on September 19. Using silver alginate. Depth of the wound is down to 0.5 cm 06/13/18; angiogram on September 25 and CT scan of the lower  leg tomorrow. He is using silver alginate depth of the wound measured at 0.8 cm today. This is after aggressive debridement 06/27/18; at angiography the patient was found to have severe calcified distal SFA stenosis and significant popliteal artery Andrew Lyons, Andrew T. ([id number]) stenosis. He underwent arthrectomy and drug-coated balloon angioplasty of the right SFA as well as a drug-coated balloon angioplasty in the right popliteal artery. He was placed on dual anyplace platelet therapy. 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 He arrives today in clinic with the area completely closed. It was on his way to closing as usual with the pressure offloading but I have no doubt that the reperfusion is helped. He sees Dr. Fletcher Anon tomorrow Electronic Signature(s) Signed: 06/28/2018 5:56:52 AM By: Linton Ham MD Entered By: Linton Ham on 06/27/2018 14:45:56 Andrew Lyons ([id number]) -------------------------------------------------------------------------------- Physical Exam Details Patient Name: Andrew Lyons Date of Service: 06/27/2018 10:15 AM Medical Record Number: [id number] Patient Account Number: [id number] Date of Birth/Sex: [birth year] (72 y.o.  M) Treating RN: Cornell Barman Primary Care Provider: [name], Delfino Lovett Other Clinician: Referring Provider: [name], [name] Treating Provider/Extender: [name] in Treatment: 6 Constitutional Sitting or standing Blood Pressure is within target range for patient.. Pulse regular and within target range for patient.Marland Kitchen Respirations regular, non-labored and within target range.. Temperature is normal and within the target range for the patient.Marland Kitchen appears in no distress. Notes Wound exam; the wound still has some thick tissue over the top which I removed with a #5 curet. However there is no open wound here. The tissue looks healthy there is no evidence of infection. Electronic Signature(s) Signed: 06/28/2018 5:56:52 AM By: Linton Ham MD Entered By: Linton Ham on 06/27/2018 14:47:13 Andrew Lyons ([id number]) -------------------------------------------------------------------------------- Physician Orders Details Patient Name: Andrew Lyons Date of Service: 06/27/2018 10:15 AM Medical Record Number: [id number] Patient Account Number: [id number] Date of Birth/Sex: [birth year] (72 y.o. M) Treating RN: Cornell Barman Primary Care Provider: [name], Delfino Lovett Other Clinician: Referring Provider: [name], [name] Treating Provider/Extender: [name] in Treatment: 6 Verbal / Phone Orders: No Diagnosis Coding Discharge From [facility] o Discharge from [facility] - treatment complete Electronic Signature(s) Signed: 06/28/2018 5:56:52 AM By: Linton Ham MD Signed: 06/28/2018 3:05:38 PM By: Gretta Cool, BSN, RN, CWS, Kim RN, BSN Entered By: Gretta Cool, BSN, RN, CWS, Kim on 06/27/2018 10:47:43 Andrew Lyons ([id number]) -------------------------------------------------------------------------------- Problem List Details Patient Name: Andrew Lyons Date of Service: 06/27/2018 10:15 AM Medical Record Number: [id number] Patient Account  Number: [id number] Date of Birth/Sex: [birth year] (72 y.o. 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 05/16/2018 No Yes ankle, right lower leg, subsequent encounter E11.51 Type 2 diabetes mellitus with diabetic peripheral angiopathy 06/27/2018 No Yes without gangrene Inactive Problems Resolved Problems Electronic Signature(s) Signed: 06/28/2018 5:56:52 AM By: Linton Ham MD Entered By: Linton Ham on 06/27/2018 14:43:27 Andrew Lyons ([id number]) -------------------------------------------------------------------------------- Progress Note Details Patient Name: Andrew Lyons Date of Service: 06/27/2018 10:15 AM Medical Record Number: [id number] Patient Account Number: [id number] Date of Birth/Sex: 1946/01/01 (72  y.o. M) Treating RN: Cornell Barman Primary Care Provider: [name], Delfino Lovett Other Clinician: Referring Provider: [name], [name] Treating Provider/Extender: [name] in Treatment: 6 Subjective History of Present Illness (HPI) 01/04/17; this is a 72 year old diabetic man who has a remote history of a traumatic lower extremity damage at age 78 requiring an amputation. He tells me spent 2 years walking on crutches then ultimately has been walking on prosthesis without any trouble since then. Several months ago he had a new prosthesis and developed an open area in the right stump in December. He saw podiatry Dr. Prudence Davidson who noted that this was really out of his practice jurisdiction and  referred him here. He has been using mupirocin. He has continued to walk on the prosthesis. The prosthesis itself as been adjusted by the prostatitis. He does not have a known arterial issue. He tells me he probably has diabetic neuropathy. He had a wound that took a long time to heal surrounding the actual amputation itself however is not had more recent wounds. 01/10/17 Culture from last week grew pseudomonas. change AB to cefdinir yesterday from doxy. he is not using his prosthesis which I emphasized although he is having it adjusted next week 01/17/17; he is completing his antibiotics in the next day or 2. We've been using silver alginate. His prosthesis is away being readjusted. He had an x-ray of the underlying bone here apparently at his podiatrist's office although I'll need to have a look at that and we can't find the result then he may need this re-x-rayed. 01/24/17; he has completed his antibiotics. Change to Silver collagen last week. No real change in the wound this week. 01/31/17 repeat culture I did last week showed a few Pseudomonas. This only has intermediate sensitivity to quinolones I therefore put him back on a week of Cefdinir. X-ray of the stump did not show any bone destruction. We are attempting to get vascular studies done through Dr.Arida's office on May 23. 02/07/17; arterial studies are booked for May 23 no major change. He has completed his antibiotics 02/14/17 arterial studies on Thursday 5/23. using collagen 02/21/17; the patient had his arterial studies. Both his ABIs were good at 1.0 on the right and 1.1 on the left. His abdominal and iliac arteries revealed atherosclerosis without focal stenosis duplex imaging of the right lower leg revealed heterogeneous plaque and biphasic Doppler waveforms from the right CFA to the popliteal artery. The right distal SFA and tibial peroneal trunk velocities were elevated. 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 The  proximal right ATA and PTA were patent above the amputation level. We have been using Silver collagen with some improvement in the wound dimensions 02/28/17; we went ahead and did TCOM measurements on this patient. One of the leads was nonfunctional however on the right leg both the proximal and periwound leads registered greater than 70 mmHg oxygen tension. Interestingly the comparison lead on the left leg/relative wound level was in the mid 40s. This allays any fears I had about contributing wound ischemia. In the meantime is measurements today were 0.2 x 0.4 x 0.7 the wound is now a small divot most of it appears to be epithelialized with only a small open area at that depth. There is no evidence of surrounding infection 03/07/17; continued improvement down to 0.3 cm in depth. This is a very tiny circular opening. No drainage no tenderness. Looks like it is been progressing towards closure. He has been using Silver  collagen although I think this is too small to use this as a primary dressing. 03/14/17; still 0.3 cm in depth tiny circular wound. No drainage or tenderness. Used Iodosorb ointment starting last week 03/21/17; still the depth and this wound. Using a #3 curet I remove necrotic material from the wound base. There is a divot here. Not making enough improvement on the Iodosorb, I change back to silver collagen this week. 03/28/17; depth down to 0.2. I cannot actually see a surface of this wound. We've been using silver collagen which I will continue 04/04/17 on evaluation today patient appears to be doing very well. In fact his wound appears to be completely healed though there is a slight eschar over the area I see no fluctuance underneath and I am reluctant to attempt scraping on the area due to the very center superficial nature of the eschar. Patient has no discomfort. 04/11/17; the patient went back to [facility] to his prosthetist however his leg was too swollen on the right deficient  into the prosthesis. The wound itself on the tip of his stump on the right is closed and was close last week. I see no reason to be Andrew Lyons, Andrew T. ([id number]) concerned about this. READMISSION 10/25/17; this is a patient that we had in clinic from April to July 2018 with a probing tiny open area on his right below sided below-knee amputation site. X-ray of this area did not show osteomyelitis. I had some concern about this being ischemic for a while although we did ABIs and T Coms that did a lot to allay these concerns. The patient is a type II diabetic although I don't have a recent hemoglobin A1c. He tells me that after he left the clinic last time he got a new prosthesis, not the prosthesis that originally caused wounds in this area. He states about a month ago used noted some dark discoloration in his skin or at least his wife did when he was getting out of the shower. He since has noted increasing pain and tenderness when he walks around this area. He arrives in clinic with no open wound 11/01/17; small probing wound on the medial aspect of the stump from a distal amputation site remotely. Surprisingly culture I did last week showed gram-positive cocci but did not culture. X-ray showed edema but no bony abnormality no soft tissue gas etc. we've been using silver alginate. He is offloading this using a scooter 11/08/17; small probing wound on the medial aspect of his stump from a distal amputation done remotely. He arrived in here with a small probing hold the did not go to bone. Culture I did of the discharged from here was negative. Nevertheless I gave him a week's worth of Augmentin wondering whether this could've been anaerobic. He is not having any pain versus when he came in here. He does have callus on the medial aspect of the stump and in my mind this represents excess pressure on this area when he walks. The area has completely closed once again READMISSION 05/16/18 This is the  third admission to the clinic for this patient who had a very distal below-knee amputation many years ago. She uses a modified prosthesis. We have had problems with recurrent wounds in the medial part of the stump which is a small area originally his distal tibia. We've been able to get this to close on 2 occasions. He tells me that this reopened about 2 months ago. Since then he's been "doctoring on it"  himself I think packing it with collagen and covering it with alginate he had left over from previous visits. Previous plain x-rays of this area have not shown soft tissue or bone issues on the stump.he tells me he went to see vein and vascular in [place] in follow-up of this area although I don't have this in my notes I'll need to research this in Epic. By his description he says that the blood flow on the right was better than the left. He is a poorly controlled diabetic with a recent hemoglobin A1c of 9 last month he has not yet on insulin. On 02/17/17 he had arterial studies. These were generally quite good with an ABI on the right of 1 on the left of 1.1. Duplex imaging with color Doppler of the right lower leg showed heterogeneous plaque and biphasic Doppler waveforms from the right SFA to the popliteal artery. Right distal SFA and tibial peroneal trunk velocities were elevated. He was felt to have a 30- 49% distal SFA stenosis however the right ATA and PTA were felt to be patent above the amputation level. I do not have that he actually saw a vascular surgeon I'll need to research this in Epic E May have actually been referring to the test just quoted 05/23/18; x-ray I did last week was negative for osteomyelitis. Culture the wound that probed to bone last time was negative [but not a bone culture]. We've been using silver alginate packing. This is now the third occasion that this wound has opened in precisely the same area with precisely the same appearance. The patient and his wife are  somewhat frustrated by this. They have made all modifications to his prosthetic to offload this area. Nevertheless he develops callus with nonviable tissue underneath and a reopening of this wound. I really do not believe that he has underlying osteomyelitis although at this point I am going to have to rule that out. I've ordered an unenhanced CT scan of the bone underneath his distal amputation. He also has some degree of PAD based on arterial studies done on 02/16/17. At that point duplex imaging suggested that the abdominal aorta and iliac vessels showed atherosclerosis without stenosis. There was heterogeneous plaque with biphasic Doppler waveforms in the right CFA to the popliteal artery right distal left SFA and tibial peroneal trunk velocities were elevated. I did a TCOM at that time that showed good results. We did get the wound to heal at that time. The wound remained closed for about 6 months only to reopen recently. 05/30/18; the patient has his consult with Dr. Fletcher Anon on 06/04/18. his CT scan of the bone on the amputation site is apparently booked for 06/14/18. We are going to look over all of this to make sure there isn't a contributing factor of the recurrent breakdown in this area other than the prosthesis and the resultant pressure. This is the third recurrence of the wound and the precisely the same area. On presentation this goes down to bone. We've been using silver alginate packing 06/06/18; the patient is going for an angiogram on September 25 by Dr. Fletcher Anon. He has a CT scan on September 19. Using silver alginate. Depth of the wound is down to 0.5 cm 06/13/18; angiogram on September 25 and CT scan of the lower leg tomorrow. He is using silver alginate depth of the wound measured at 0.8 cm today. This is after aggressive debridement 06/27/18; at angiography the patient was found to have severe calcified distal SFA  stenosis and significant popliteal artery stenosis. He underwent arthrectomy  and drug-coated balloon angioplasty of the right SFA as well as a drug-coated balloon angioplasty in the right popliteal artery. He was placed on dual anyplace platelet therapy. He has an occluded anterior tibial Andrew [place]ons, Andrew T. ([id number]) artery with diffuse disease in the midsegment of the posterior tibial artery. He arrives today in clinic with the area completely closed. It was on his way to closing as usual with the pressure offloading but I have no doubt that the reperfusion is helped. He sees Dr. Fletcher Anon tomorrow Objective Constitutional Sitting or standing Blood Pressure is within target range for patient.. Pulse regular and within target range for patient.Marland Kitchen Respirations regular, non-labored and within target range.. Temperature is normal and within the target range for the patient.Marland Kitchen appears in no distress. Vitals Time Taken: 10:28 AM, Height: 70 in, Weight: 264.5 lbs, BMI: 37.9, Temperature: 98.1 F, Pulse: 65 bpm, Respiratory Rate: 16 breaths/min, Blood Pressure: 130/57 mmHg. General Notes: Wound exam; the wound still has some thick tissue over the top which I removed with a #5 curet. However there is no open wound here. The tissue looks healthy there is no evidence of infection. Integumentary (Hair, Skin) Wound #3 status is Healed - Epithelialized. Original cause of wound was Pressure Injury. 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 The wound measures 0cm length x 0cm width x 0cm depth; 0cm^2 area and 0cm^3 volume. There is Fat Layer (Subcutaneous Tissue) Exposed exposed. There is no tunneling or undermining noted. There is a none present amount of drainage noted. The wound margin is distinct with the outline attached to the wound base. There is no granulation within the wound bed. There is no necrotic tissue within the wound bed. The periwound skin appearance exhibited: Callus. The periwound skin appearance did not exhibit: Crepitus, Excoriation,  Induration, Rash, Scarring, Dry/Scaly, Maceration, Atrophie Blanche, Cyanosis, Ecchymosis, Hemosiderin Staining, Mottled, Pallor, Rubor, Erythema. Periwound temperature was noted as No Abnormality. 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 - treatment complete #1 the patient's probing wound bone on the medial part of his distal amputation has closed. We have been here before however this time he has been revascularized. #2 he follows up with Dr. Fletcher Anon tomorrow. His prosthesis is been readjusted. From my point of view I don't think that he needs to delay and attempting to use the prosthesis. We'll see if revascularization is been sufficient to maintain obtain the stump integrity. #3 a CT scan I did have the bone here did not show any underlying abnormalities nothing to suggest osteomyelitis. #4 if this does not maintain his stump I think he will need surgery and stump revision Electronic Signature(s) Signed: 06/28/2018 5:56:52 AM By: Linton Ham MD Entered By: Linton Ham on 06/27/2018 14:48:49 Andrew Lyons ([id number]) -------------------------------------------------------------------------------- SuperBill Details Patient Name: Andrew Lyons Date of Service: 06/27/2018 Medical Record Number: [id number] Patient Account Number: [id number] Date of Birth/Sex: 1946-03-27 (72 y.o. 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, right lower leg, subsequent encounter E11.51 Type 2 diabetes mellitus with diabetic peripheral angiopathy without gangrene Facility Procedures CPT4 Code: [id number] Description: [phone] - WOUND CARE VISIT-LEV 2 EST PT Modifier: Quantity: 1 Physician Procedures CPT4: Description Modifier Quantity Code [id number] - WC PHYS LEVEL 2 - EST PT 1 ICD-10 Diagnosis Description E11.622 Type 2 diabetes mellitus with other skin ulcer L97.313 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

## 2018-07-02 DIAGNOSIS — N401 Enlarged prostate with lower urinary tract symptoms: Secondary | ICD-10-CM | POA: Diagnosis not present

## 2018-07-02 DIAGNOSIS — K649 Unspecified hemorrhoids: Secondary | ICD-10-CM | POA: Diagnosis not present

## 2018-07-06 ENCOUNTER — Other Ambulatory Visit: Payer: Self-pay | Admitting: *Deleted

## 2018-07-06 DIAGNOSIS — I739 Peripheral vascular disease, unspecified: Secondary | ICD-10-CM

## 2018-07-11 ENCOUNTER — Other Ambulatory Visit: Payer: Self-pay | Admitting: *Deleted

## 2018-07-11 DIAGNOSIS — I739 Peripheral vascular disease, unspecified: Secondary | ICD-10-CM

## 2018-07-12 ENCOUNTER — Ambulatory Visit: Payer: PPO | Admitting: Cardiovascular Disease

## 2018-07-12 DIAGNOSIS — K648 Other hemorrhoids: Secondary | ICD-10-CM | POA: Insufficient documentation

## 2018-07-12 DIAGNOSIS — Z86010 Personal history of colonic polyps: Secondary | ICD-10-CM | POA: Diagnosis not present

## 2018-07-12 DIAGNOSIS — K649 Unspecified hemorrhoids: Secondary | ICD-10-CM | POA: Diagnosis not present

## 2018-07-13 ENCOUNTER — Ambulatory Visit: Payer: PPO | Admitting: Cardiovascular Disease

## 2018-07-13 ENCOUNTER — Telehealth: Payer: Self-pay | Admitting: Cardiovascular Disease

## 2018-07-13 ENCOUNTER — Encounter: Payer: Self-pay | Admitting: Cardiovascular Disease

## 2018-07-13 ENCOUNTER — Ambulatory Visit (INDEPENDENT_AMBULATORY_CARE_PROVIDER_SITE_OTHER): Payer: PPO

## 2018-07-13 VITALS — BP 140/60 | HR 70 | Ht 70.0 in | Wt 256.2 lb

## 2018-07-13 DIAGNOSIS — I739 Peripheral vascular disease, unspecified: Secondary | ICD-10-CM

## 2018-07-13 DIAGNOSIS — I2510 Atherosclerotic heart disease of native coronary artery without angina pectoris: Secondary | ICD-10-CM

## 2018-07-13 DIAGNOSIS — Z23 Encounter for immunization: Secondary | ICD-10-CM | POA: Diagnosis not present

## 2018-07-13 DIAGNOSIS — E785 Hyperlipidemia, unspecified: Secondary | ICD-10-CM

## 2018-07-13 MED ORDER — CLOPIDOGREL BISULFATE 75 MG PO TABS: 75.0000 mg | ORAL_TABLET | Freq: Every day | ORAL | 6 refills | Status: DC

## 2018-07-13 NOTE — Telephone Encounter (Signed)
Left a message for the patient to call back.  
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
## 2018-07-13 NOTE — Telephone Encounter (Signed)
Patient returning call RE AAA

## 2018-07-13 NOTE — Patient Instructions (Signed)
Medication Instructions:  No changes  If you need a refill on your cardiac medications before your next appointment, please call your pharmacy.   Lab work: None ordered  Testing/Procedures: None ordered  Follow-Up: At [facility], you and your health needs are our priority.  As part of our continuing mission to provide you with exceptional heart care, we have created designated Provider Care Teams.  These Care Teams include your primary Cardiologist (physician) and Advanced Practice Providers (APPs -  Physician Assistants and Nurse Practitioners) who all work together to provide you with the care you need, when you need it. You will need a follow up appointment in 6 months after the dopplers have been completed. Please call our office 2 months in advance to schedule this appointment.  You may see Dr. Fletcher Anon or one of the following Advanced Practice Providers on your designated Care Team:   Murray Hodgkins, NP Christell Faith, PA-C . Marrianne Mood, PA-C

## 2018-07-13 NOTE — Telephone Encounter (Signed)
Please call pt wife is asking to get clarification about pt abdominal aorta aneurysm.

## 2018-07-17 NOTE — Telephone Encounter (Signed)
We will monitor this in our office on a yearly basis.  It is very small and not concerning at the present time.

## 2018-07-17 NOTE — Telephone Encounter (Signed)
Spoke with the patient. He stated that he had a concern over the small abdominal aortic aneurysm noted when he had his aortogram. He would like to know if anything needs to be done at this point. Message has been routed to the provider.

## 2018-07-18 NOTE — Telephone Encounter (Signed)
Patient's wife called and made aware, per dpr. She verbalized her understanding.

## 2018-08-06 ENCOUNTER — Encounter: Payer: PPO | Admitting: Family Medicine

## 2018-08-06 ENCOUNTER — Ambulatory Visit: Payer: PPO

## 2018-08-07 ENCOUNTER — Encounter: Payer: Self-pay | Admitting: Family Medicine

## 2018-08-10 ENCOUNTER — Ambulatory Visit (INDEPENDENT_AMBULATORY_CARE_PROVIDER_SITE_OTHER): Payer: PPO

## 2018-08-10 VITALS — BP 154/62 | HR 74 | Temp 98.4°F | Ht 70.0 in | Wt 251.4 lb

## 2018-08-10 DIAGNOSIS — Z0000 Encounter for general adult medical examination without abnormal findings: Secondary | ICD-10-CM

## 2018-08-10 NOTE — Patient Instructions (Addendum)
Andrew Lyons , Thank you for taking time to come for your Medicare Wellness Visit. I appreciate your ongoing commitment to your health goals. Please review the following plan we discussed and let me know if I can assist you in the future.   Screening recommendations/referrals: Colonoscopy: Up to date, due 02/2020 Recommended yearly ophthalmology/optometry visit for glaucoma screening and checkup Recommended yearly dental visit for hygiene and checkup  Vaccinations: Influenza vaccine: Up to date Pneumococcal vaccine: Completed series Tdap vaccine: Up to date, due 05/2020 Shingles vaccine: Pt declines today.     Advanced directives: Advance directive discussed with you today. Even though you declined this today please call our office should you change your mind and we can give you the proper paperwork for you to fill out.  Conditions/risks identified: Obesity- recommend to continue cutting back on starches to help aid in weight loss and better diabetes control.   Next appointment: 08/21/18 @ 11:20 AM  Preventive Care 71 Years and Older, Male Preventive care refers to lifestyle choices and visits with your health care provider that can promote health and wellness. What does preventive care include?  A yearly physical exam. This is also called an annual well check.  Dental exams once or twice a year.  Routine eye exams. Ask your health care provider how often you should have your eyes checked.  Personal lifestyle choices, including:  Daily care of your teeth and gums.  Regular physical activity.  Eating a healthy diet.  Avoiding tobacco and drug use.  Limiting alcohol use.  Practicing safe sex.  Taking low doses of aspirin every day.  Taking vitamin and mineral supplements as recommended by your health care provider. What happens during an annual well check? The services and screenings done by your health care provider during your annual well check will depend on your age,  overall health, lifestyle risk factors, and family history of disease. Counseling  Your health care provider may ask you questions about your:  Alcohol use.  Tobacco use.  Drug use.  Emotional well-being.  Home and relationship well-being.  Sexual activity.  Eating habits.  History of falls.  Memory and ability to understand (cognition).  Work and work Statistician. Screening  You may have the following tests or measurements:  Height, weight, and BMI.  Blood pressure.  Lipid and cholesterol levels. These may be checked every 5 years, or more frequently if you are over 30 years old.  Skin check.  Lung cancer screening. You may have this screening every year starting at age 26 if you have a 30-pack-year history of smoking and currently smoke or have quit within the past 15 years.  Fecal occult blood test (FOBT) of the stool. You may have this test every year starting at age 69.  Flexible sigmoidoscopy or colonoscopy. You may have a sigmoidoscopy every 5 years or a colonoscopy every 10 years starting at age 25.  Prostate cancer screening. Recommendations will vary depending on your family history and other risks.  Hepatitis C blood test.  Hepatitis B blood test.  Sexually transmitted disease (STD) testing.  Diabetes screening. This is done by checking your blood sugar (glucose) after you have not eaten for a while (fasting). You may have this done every 1-3 years.  Abdominal aortic aneurysm (AAA) screening. You may need this if you are a current or former smoker.  Osteoporosis. You may be screened starting at age 46 if you are at high risk. Talk with your health care provider about your  test results, treatment options, and if necessary, the need for more tests. Vaccines  Your health care provider may recommend certain vaccines, such as:  Influenza vaccine. This is recommended every year.  Tetanus, diphtheria, and acellular pertussis (Tdap, Td) vaccine. You may  need a Td booster every 10 years.  Zoster vaccine. You may need this after age 1.  Pneumococcal 13-valent conjugate (PCV13) vaccine. One dose is recommended after age 75.  Pneumococcal polysaccharide (PPSV23) vaccine. One dose is recommended after age 81. Talk to your health care provider about which screenings and vaccines you need and how often you need them. This information is not intended to replace advice given to you by your health care provider. Make sure you discuss any questions you have with your health care provider. Document Released: 10/09/2015 Document Revised: 06/01/2016 Document Reviewed: 07/14/2015 Elsevier Interactive Patient Education  2017 [employer] Prevention in the Home Falls can cause injuries. They can happen to people of all ages. There are many things you can do to make your home safe and to help prevent falls. What can I do on the outside of my home?  Regularly fix the edges of walkways and driveways and fix any cracks.  Remove anything that might make you trip as you walk through a door, such as a raised step or threshold.  Trim any bushes or trees on the path to your home.  Use bright outdoor lighting.  Clear any walking paths of anything that might make someone trip, such as rocks or tools.  Regularly check to see if handrails are loose or broken. Make sure that both sides of any steps have handrails.  Any raised decks and porches should have guardrails on the edges.  Have any leaves, snow, or ice cleared regularly.  Use sand or salt on walking paths during winter.  Clean up any spills in your garage right away. This includes oil or grease spills. What can I do in the bathroom?  Use night lights.  Install grab bars by the toilet and in the tub and shower. Do not use towel bars as grab bars.  Use non-skid mats or decals in the tub or shower.  If you need to sit down in the shower, use a plastic, non-slip stool.  Keep the floor  dry. Clean up any water that spills on the floor as soon as it happens.  Remove soap buildup in the tub or shower regularly.  Attach bath mats securely with double-sided non-slip rug tape.  Do not have throw rugs and other things on the floor that can make you trip. What can I do in the bedroom?  Use night lights.  Make sure that you have a light by your bed that is easy to reach.  Do not use any sheets or blankets that are too big for your bed. They should not hang down onto the floor.  Have a firm chair that has side arms. You can use this for support while you get dressed.  Do not have throw rugs and other things on the floor that can make you trip. What can I do in the kitchen?  Clean up any spills right away.  Avoid walking on wet floors.  Keep items that you use a lot in easy-to-reach places.  If you need to reach something above you, use a strong step stool that has a grab bar.  Keep electrical cords out of the way.  Do not use floor polish or wax that  makes floors slippery. If you must use wax, use non-skid floor wax.  Do not have throw rugs and other things on the floor that can make you trip. What can I do with my stairs?  Do not leave any items on the stairs.  Make sure that there are handrails on both sides of the stairs and use them. Fix handrails that are broken or loose. Make sure that handrails are as long as the stairways.  Check any carpeting to make sure that it is firmly attached to the stairs. Fix any carpet that is loose or worn.  Avoid having throw rugs at the top or bottom of the stairs. If you do have throw rugs, attach them to the floor with carpet tape.  Make sure that you have a light switch at the top of the stairs and the bottom of the stairs. If you do not have them, ask someone to add them for you. What else can I do to help prevent falls?  Wear shoes that:  Do not have high heels.  Have rubber bottoms.  Are comfortable and fit you  well.  Are closed at the toe. Do not wear sandals.  If you use a stepladder:  Make sure that it is fully opened. Do not climb a closed stepladder.  Make sure that both sides of the stepladder are locked into place.  Ask someone to hold it for you, if possible.  Clearly mark and make sure that you can see:  Any grab bars or handrails.  First and last steps.  Where the edge of each step is.  Use tools that help you move around (mobility aids) if they are needed. These include:  Canes.  Walkers.  Scooters.  Crutches.  Turn on the lights when you go into a dark area. Replace any light bulbs as soon as they burn out.  Set up your furniture so you have a clear path. Avoid moving your furniture around.  If any of your floors are uneven, fix them.  If there are any pets around you, be aware of where they are.  Review your medicines with your doctor. Some medicines can make you feel dizzy. This can increase your chance of falling. Ask your doctor what other things that you can do to help prevent falls. This information is not intended to replace advice given to you by your health care provider. Make sure you discuss any questions you have with your health care provider. Document Released: 07/09/2009 Document Revised: 02/18/2016 Document Reviewed: 10/17/2014 Elsevier Interactive Patient Education  2017 [employer].

## 2018-08-10 NOTE — Progress Notes (Signed)
Subjective:   Andrew Lyons is a 72 y.o. male who presents for Medicare Annual/Subsequent preventive examination.  Review of Systems:  N/A  Cardiac Risk Factors include: advanced age (>38mn, >>67women);diabetes mellitus;hypertension;male gender;obesity (BMI >30kg/m2)     Objective:    Vitals: BP (!) 154/62 (BP Location: Right Arm)   Pulse 74   Temp 98.4 F (36.9 C) (Oral)   Ht 5' 10" (1.778 m)   Wt 251 lb 6.4 oz (114 kg)   BMI 36.07 kg/m   Body mass index is 36.07 kg/m.  Advanced Directives 08/10/2018 06/20/2018 05/22/2017 05/18/2017 09/15/2016 01/12/2016  Does Patient Have a Medical Advance Directive? No No No Yes No No  Type of Advance Directive - - -Public librarianLiving will - -  [place] of [place]? - - - No - copy requested - -  Would patient like information on creating a medical advance directive? No - Patient declined No - Patient declined - - - -    Tobacco Social History   Tobacco Use  Smoking Status Former Smoker  Smokeless Tobacco Never Used  Tobacco Comment   Quit smoking in 2003; Started smoking at age 72 smoked about 40 years, smoked over 3 packs per day     Counseling given: Not Answered Comment: Quit smoking in 2003; Started smoking at age 72 smoked about 40 years, smoked over 3 packs per day   Clinical Intake:  Pre-visit preparation completed: Yes  Pain : No/denies pain Pain Score: 0-No pain    Diabetes:  Is the patient diabetic?  Yes , type 2 If diabetic, was a CBG obtained today?  No  Did the patient bring in their glucometer from home?  No  How often do you monitor your CBG's? Once daily.   Financial Strains and Diabetes Management:  Are you having any financial strains with the device, your supplies or your medication? No .  Does the patient want to be seen by Chronic Care Management for management of their diabetes?  No  Would the patient like to be referred to a Nutritionist or for  Diabetic Management?  No   Diabetic Exams:  Diabetic Eye Exam: Completed 10/11/17.   Diabetic Foot Exam: Completed 01/23/17. Pt has been advised about the importance in completing this exam. Note made to complete this at future CPE.   Nutritional Status: BMI > 30  Obese Nutritional Risks: None   How often do you need to have someone help you when you read instructions, pamphlets, or other written materials from your doctor or pharmacy?: 1 - Never  Interpreter Needed?: No  Information [facility] by :: [facility] L[facility]  Past Medical History:  Diagnosis Date  . 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 Barrett's esophagus with dysplasia   . COPD (chronic obstructive pulmonary disease) ([place]   . Coronary artery disease   . Diabetes mellitus without complication ([place]   . Hepatitis   . Hyperlipidemia   . Hypertension   . Myocardial infarction ([place]   . Seizures ([place]   . Sleep apnea    Past Surgical History:  Procedure Laterality Date  . ABDOMINAL AORTOGRAM W/LOWER EXTREMITY N/A 06/20/2018   Procedure: ABDOMINAL AORTOGRAM W/LOWER EXTREMITY;  Surgeon: AWellington Hampshire MD;  [facility];  Service: Cardiovascular;  Laterality: N/A;  . CARDIAC CATHETERIZATION     with Angioplasty  . CARDIAC VALVE REPLACEMENT     Aortic Valve Replacement  .  COLONOSCOPY WITH PROPOFOL N/A 03/18/2015   Procedure: COLONOSCOPY WITH PROPOFOL;  Surgeon: [name], MD;  Location: [facility] ENDOSCOPY;  Service: Endoscopy;  Laterality: N/A;  . ESOPHAGOGASTRODUODENOSCOPY N/A 03/18/2015   Procedure: ESOPHAGOGASTRODUODENOSCOPY (EGD);  Surgeon: [name], MD;  Location[facility] END[facility]COPY;  Service: Endoscopy;  Laterality: N/A;  . FOOT AMPUTATION    . PERIPHERAL VASCULAR INTERVENTION Right 06/20/2018   Procedure: PERIPHERAL VASCULAR INTERVENTION;  Surgeon: Wellington Hampshire, MD;  Location: [place] [facility];  Service: Cardiovascular;  Laterality: Right;  . THORACOTOMY Right   .  TONSILLECTOMY     Family History  Problem Relation Age of Onset  . Obesity Son   . Diabetes Brother   . Hypertension Brother   . Heart attack Mother   . Heart attack Father    Social History   Socioeconomic History  . Marital status: Married    Spouse name: Not on file  . Number of children: 2  . Years of education: Not on file  . Highest education level: 8th grade  Occupational History  . Occupation: retired  Scientific laboratory technician  . Financial resource strain: Not hard at all  . Food insecurity:    Worry: Never true    Inability: Never true  . Transportation needs:    Medical: No    Non-medical: No  Tobacco Use  . Smoking status: Former Research scientist (life sciences)  . Smokeless tobacco: Never Used  . Tobacco comment: Quit smoking in 2003; Started smoking at age 23, smoked about 40 years, smoked over 3 packs per day  Substance and Sexual Activity  . Alcohol use: Not Currently  . Drug use: No  . Sexual activity: Not on file  Lifestyle  . Physical activity:    Days per week: 0 days    Minutes per session: 0 min  . Stress: Not at all  Relationships  . Social connections:    Talks on phone: Patient refused    Gets together: Patient refused    Attends religious service: Patient refused    Active member of club or organization: Patient refused    Attends meetings of clubs or organizations: Patient refused    Relationship status: Patient refused  Other Topics Concern  . Not on file  Social History Narrative  . Not on file    Outpatient Encounter Medications as of 08/10/2018  Medication Sig  . amLODipine-benazepril (LOTREL) 5-40 MG capsule Take 1 capsule by mouth daily.  Marland Kitchen aspirin EC 81 MG tablet Take 1 tablet (81 mg total) by mouth daily.  . B-D UF III MINI PEN NEEDLES 31G X 5 MM MISC USE WITH PEN DAILY  . Blood Glucose Monitoring Suppl (ONE TOUCH ULTRA SYSTEM KIT) w/Device KIT To use daily to check sugar. DX E11.9-needs one touch ultra meter  . Cholecalciferol (VITAMIN D3) 2000 units TABS  Take 2,000 Units by mouth daily.  . clopidogrel (PLAVIX) 75 MG tablet Take 1 tablet (75 mg total) by mouth daily.  . Coenzyme Q10 100 MG TABS Take 100 mg by mouth daily.   . fluticasone (FLONASE) 50 MCG/ACT nasal spray USE 2 SPRAYS IN EACH NOSTRIL EVERY DAY (Patient taking differently: Place 2 sprays into both nostrils daily. )  . glucose blood (ONE TOUCH ULTRA TEST) test strip Check sugar 3 times daily. DX E11.9-strips for one touch ultra meter  . JARDIANCE 25 MG TABS tablet TAKE 1 TABLET BY MOUTH DAILY (Patient taking differently: Take 25 mg by mouth daily. )  . liraglutide (VICTOZA) 18 MG/3ML  SOPN INJECT 1.2 SUBCUTANEOUSLY EVERY DAY (Patient taking differently: Inject 0.6 mg into the skin daily. INJECT 1.2 SUBCUTANEOUSLY EVERY DAY)  . metFORMIN (GLUCOPHAGE) 500 MG tablet TAKE 1 TABLET (500 MG TOTAL) BY MOUTH 2 (TWO) TIMES DAILY WITH A MEAL.  . metoprolol succinate (TOPROL-XL) 100 MG 24 hr tablet TAKE 1 TABLET BY MOUTH EVERY DAY (Patient taking differently: Take 100 mg by mouth daily. )  . montelukast (SINGULAIR) 10 MG tablet Take 1 tablet (10 mg total) by mouth daily.  Marland Kitchen omeprazole (PRILOSEC) 40 MG capsule TAKE ONE CAPSULE BY MOUTH EVERY DAY (Patient taking differently: Take 40 mg by mouth daily. )  . ONETOUCH DELICA LANCETS FINE MISC 1 Device by Does not apply route 3 (three) times daily.  . rosuvastatin (CRESTOR) 20 MG tablet TAKE 1 TABLET BY MOUTH EVERY DAY (Patient taking differently: Take 20 mg by mouth daily. )  . vitamin B-12 (CYANOCOBALAMIN) 1000 MCG tablet Take 1,000 mcg by mouth daily.  Marland Kitchen zinc gluconate 50 MG tablet Take 50 mg by mouth daily.  . diphenhydrAMINE (BENADRYL) 50 MG tablet Take one 50 mg tablet the morning of the procedure with the last dose of the prednisone. (Patient not taking: Reported on 08/10/2018)  . sildenafil (VIAGRA) 100 MG tablet USE AS DIRECTED   No facility-administered encounter medications on file as of 08/10/2018.     Activities of Daily Living In your  present state of health, do you have any difficulty performing the following activities: 08/10/2018  Hearing? Y  Comment Does not wear hearing aids.  Vision? N  Comment Wears eye glasses.  Difficulty concentrating or making decisions? N  Walking or climbing stairs? N  Dressing or bathing? N  Doing errands, shopping? N  Preparing Food and eating ? N  Using the Toilet? N  In the past six months, have you accidently leaked urine? N  Do you have problems with loss of bowel control? N  Managing your Medications? N  Managing your Finances? N  Housekeeping or managing your Housekeeping? N  Some recent data might be hidden    Patient Care Team: [name]., MD as PCP - General (Family Medicine) Corey Skains, MD as Consulting Physician (Cardiology) Ralene Bathe, MD as Consulting Physician (Dermatology) Wellington Hampshire, MD as Consulting Physician (Cardiology)   Assessment:   This is a routine wellness examination for Andrew Lyons.  Exercise Activities and Dietary recommendations Current Exercise Habits: The patient does not participate in regular exercise at present, Exercise limited by: Other - see comments(Afraid of infection on nub from friction with exercising.)  Goals    . DIET - REDUCE CALORIE INTAKE     Recommend to continue cutting back on starches to help aid in weight loss and better diabetes control.     . Reduce portion size     Recommend eating 3 small meals a day and two healthy protein snacks in between.        Fall Risk Fall Risk  08/10/2018 05/18/2017 05/16/2016 08/24/2015  Falls in the past year? 0 No No No   FALL RISK PREVENTION PERTAINING TO THE HOME:  Any stairs in or around the home WITH handrails? No  Home free of loose throw rugs in walkways, pet beds, electrical cords, etc? Yes  Adequate lighting in your home to reduce risk of falls? Yes   ASSISTIVE DEVICES UTILIZED TO PREVENT FALLS:  Life alert? No  Use of a cane, walker or w/c?  Uses a knee scooter as  needed. Grab bars in the bathroom? No  Shower chair or bench in shower? Yes  Elevated toilet seat or a handicapped toilet? No    TIMED UP AND GO:  Was the test performed? No .     Depression Screen PHQ 2/9 Scores 08/10/2018 05/18/2017 05/16/2016 08/24/2015  PHQ - 2 Score 0 0 0 0    Cognitive Function     6CIT Screen 08/10/2018  What Year? 0 points  What month? 0 points  What time? 0 points  Count back from 20 0 points  Months in reverse 0 points  Repeat phrase 0 points  Total Score 0    Immunization History  Administered Date(s) Administered  . Influenza, High Dose Seasonal PF 08/05/2016, 05/22/2017, 07/13/2018  . Influenza,inj,Quad PF,6+ Mos 09/11/2015  . Pneumococcal Conjugate-13 08/14/2014  . Pneumococcal Polysaccharide-23 08/02/2007, 10/22/2012  . Tdap 06/09/2010  . Zoster 10/22/2012    Qualifies for Shingles Vaccine? Yes  Zostavax completed 10/22/12. Due for Shingrix. Education has been provided regarding the importance of this vaccine. Pt has been advised to call insurance company to determine out of pocket expense. Advised may also receive vaccine at local pharmacy or Health Dept. Verbalized acceptance and understanding.  Tdap: Up to date  Flu Vaccine: Up to date  Pneumococcal Vaccine: Up to date   Screening Tests Health Maintenance  Topic Date Due  . FOOT EXAM  01/23/2018  . OPHTHALMOLOGY EXAM  10/11/2018  . HEMOGLOBIN A1C  10/27/2018  . COLONOSCOPY  03/17/2020  . TETANUS/TDAP  06/09/2020  . INFLUENZA VACCINE  Completed  . Hepatitis C Screening  Completed  . PNA vac Low Risk Adult  Completed   Cancer Screenings:  Colorectal Screening: Completed 03/18/15. Repeat every 5 years.  Lung Cancer Screening: (Low Dose CT Chest recommended if Age 60-80 years, 30 pack-year currently smoking OR have quit w/in 15years.) does not qualify.    Additional Screening:  Hepatitis C Screening: Up to date  Vision Screening: Recommended  annual ophthalmology exams for early detection of glaucoma and other disorders of the eye.  Dental Screening: Recommended annual dental exams for proper oral hygiene  Community Resource Referral:  CRR required this visit?  No        Plan:  I have personally reviewed and addressed the Medicare Annual Wellness questionnaire and have noted the following in the patient's chart:  A. Medical and social history B. Use of alcohol, tobacco or illicit drugs  C. Current medications and supplements D. Functional ability and status E.  Nutritional status F.  Physical activity G. Advance directives H. List of other physicians I.  Hospitalizations, surgeries, and ER visits in previous 12 months J.  [place] such as hearing and vision if needed, cognitive and depression L. Referrals and appointments - none  In addition, I have reviewed and discussed with patient certain preventive protocols, quality metrics, and best practice recommendations. A written personalized care plan for preventive services as well as general preventive health recommendations were provided to patient.  See attached scanned questionnaire for additional information.   Signed,  Fabio Neighbors, LPN Nurse Health Advisor   Nurse Recommendations: Pt needs a diabetic foot exam at CPE.

## 2018-08-13 ENCOUNTER — Encounter: Payer: Self-pay | Admitting: Family Medicine

## 2018-08-13 DIAGNOSIS — H01022 Squamous blepharitis right lower eyelid: Secondary | ICD-10-CM | POA: Diagnosis not present

## 2018-08-13 DIAGNOSIS — H01025 Squamous blepharitis left lower eyelid: Secondary | ICD-10-CM | POA: Diagnosis not present

## 2018-08-13 DIAGNOSIS — H25813 Combined forms of age-related cataract, bilateral: Secondary | ICD-10-CM | POA: Diagnosis not present

## 2018-08-13 DIAGNOSIS — E119 Type 2 diabetes mellitus without complications: Secondary | ICD-10-CM | POA: Diagnosis not present

## 2018-08-13 DIAGNOSIS — H01021 Squamous blepharitis right upper eyelid: Secondary | ICD-10-CM | POA: Diagnosis not present

## 2018-08-13 DIAGNOSIS — H01024 Squamous blepharitis left upper eyelid: Secondary | ICD-10-CM | POA: Diagnosis not present

## 2018-08-13 LAB — HM DIABETES EYE EXAM

## 2018-08-14 DIAGNOSIS — N401 Enlarged prostate with lower urinary tract symptoms: Secondary | ICD-10-CM | POA: Diagnosis not present

## 2018-08-20 DIAGNOSIS — N401 Enlarged prostate with lower urinary tract symptoms: Secondary | ICD-10-CM | POA: Diagnosis not present

## 2018-08-21 ENCOUNTER — Ambulatory Visit (INDEPENDENT_AMBULATORY_CARE_PROVIDER_SITE_OTHER): Payer: PPO | Admitting: Family Medicine

## 2018-08-21 ENCOUNTER — Encounter: Payer: Self-pay | Admitting: Family Medicine

## 2018-08-21 VITALS — BP 134/68 | HR 66 | Temp 98.4°F | Resp 16 | Ht 70.0 in | Wt 249.0 lb

## 2018-08-21 DIAGNOSIS — I10 Essential (primary) hypertension: Secondary | ICD-10-CM | POA: Diagnosis not present

## 2018-08-21 DIAGNOSIS — E1159 Type 2 diabetes mellitus with other circulatory complications: Secondary | ICD-10-CM | POA: Diagnosis not present

## 2018-08-21 DIAGNOSIS — I2510 Atherosclerotic heart disease of native coronary artery without angina pectoris: Secondary | ICD-10-CM | POA: Diagnosis not present

## 2018-08-21 DIAGNOSIS — S88911S Complete traumatic amputation of right lower leg, level unspecified, sequela: Secondary | ICD-10-CM

## 2018-08-21 DIAGNOSIS — N400 Enlarged prostate without lower urinary tract symptoms: Secondary | ICD-10-CM

## 2018-08-21 DIAGNOSIS — G4733 Obstructive sleep apnea (adult) (pediatric): Secondary | ICD-10-CM | POA: Diagnosis not present

## 2018-08-21 DIAGNOSIS — E118 Type 2 diabetes mellitus with unspecified complications: Secondary | ICD-10-CM | POA: Diagnosis not present

## 2018-08-21 DIAGNOSIS — K22719 Barrett's esophagus with dysplasia, unspecified: Secondary | ICD-10-CM | POA: Diagnosis not present

## 2018-08-21 DIAGNOSIS — Z86010 Personal history of colonic polyps: Secondary | ICD-10-CM | POA: Diagnosis not present

## 2018-08-21 DIAGNOSIS — Z0000 Encounter for general adult medical examination without abnormal findings: Secondary | ICD-10-CM

## 2018-08-21 DIAGNOSIS — E7800 Pure hypercholesterolemia, unspecified: Secondary | ICD-10-CM | POA: Diagnosis not present

## 2018-08-21 DIAGNOSIS — Z6838 Body mass index (BMI) 38.0-38.9, adult: Secondary | ICD-10-CM

## 2018-08-21 NOTE — Progress Notes (Signed)
Patient: Andrew Lyons, Male    DOB: [birth year], 72 y.o.   MRN: [id number] Visit Date: 08/21/2018  Today's Provider: [name], MD   No chief complaint on file.  Subjective:   Patient had a AWE with McKenzie on 08/10/2018   Complete Physical Andrew Lyons is a 71 y.o. male. He feels well. He reports exercising not regularly, but he does stay active. He reports he is sleeping well.   Colonoscopy- 03/18/2015. Diverticulosis. Internal hemorrhoids. Hyperplastic polyps.  Tdap- 06/09/2010.     Review of Systems  Constitutional: Negative.   HENT: Negative.   Eyes: Negative.   Respiratory: Negative.   Cardiovascular: Negative.   Gastrointestinal: Negative.   Endocrine: Negative.   Genitourinary: Negative.   Musculoskeletal: Negative.   Skin: Negative.   Allergic/Immunologic: Negative.   Neurological: Negative.   Hematological: Negative.   Psychiatric/Behavioral: Negative.     Social History   Socioeconomic History  . Marital status: Married    Spouse name: Not on file  . Number of children: 2  . Years of education: Not on file  . Highest education level: 8th grade  Occupational History  . Occupation: retired  Scientific laboratory technician  . Financial resource strain: Not hard at all  . Food insecurity:    Worry: Never true    Inability: Never true  . Transportation needs:    Medical: No    Non-medical: No  Tobacco Use  . Smoking status: Former Research scientist (life sciences)  . Smokeless tobacco: Never Used  . Tobacco comment: Quit smoking in 2003; Started smoking at age 53, smoked about 40 years, smoked over 3 packs per day  Substance and Sexual Activity  . Alcohol use: Not Currently  . Drug use: No  . Sexual activity: Not on file  Lifestyle  . Physical activity:    Days per week: 0 days    Minutes per session: 0 min  . Stress: Not at all  Relationships  . Social connections:    Talks on phone: Patient refused    Gets together: Patient refused    Attends religious  service: Patient refused    Active member of club or organization: Patient refused    Attends meetings of clubs or organizations: Patient refused    Relationship status: Patient refused  . Intimate partner violence:    Fear of current or ex partner: Not on file    Emotionally abused: Not on file    Physically abused: Not on file    Forced sexual activity: Not on file  Other Topics Concern  . Not on file  Social History Narrative  . Not on file    Past Medical History:  Diagnosis Date  . 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 Barrett's esophagus with dysplasia   . COPD (chronic obstructive pulmonary disease) ([place])   . Coronary artery disease   . Diabetes mellitus without complication ([place])   . Hepatitis   . Hyperlipidemia   . Hypertension   . Myocardial infarction ([place])   . Seizures ([place])   . Sleep apnea      Patient Active Problem List   Diagnosis Date Noted  . Traumatic lower limb amputation ([place]) 10/26/2015  . OAB (overactive bladder) 08/17/2015  . Allergic rhinitis 03/31/2015  . Arthritis 03/31/2015  . Benign fibroma of prostate 03/31/2015  . Atherosclerosis of coronary artery 03/31/2015  . Diabetes ([place]) 03/31/2015  . ED (erectile dysfunction) of organic origin 03/31/2015  . Acid  reflux 03/31/2015  . HLD (hyperlipidemia) 03/31/2015  . BP (high blood pressure) 03/31/2015  . Malaise and fatigue 03/31/2015  . Combined fat and carbohydrate induced hyperlipemia 03/31/2015  . Adiposity 03/31/2015  . Obstructive apnea 03/31/2015  . Barrett esophagus 02/18/2015  . H/O adenomatous polyp of colon 02/18/2015  . Benign essential HTN 02/12/2015  . Aortic valve, bicuspid 01/15/2014  . Arteriosclerosis of coronary artery 01/15/2014  . H/O aortic valve replacement 01/15/2014    Past Surgical History:  Procedure Laterality Date  . ABDOMINAL AORTOGRAM W/LOWER EXTREMITY N/A 06/20/2018   Procedure: ABDOMINAL AORTOGRAM W/LOWER EXTREMITY;  Surgeon: Wellington Hampshire,  MD;  Location: [place];  Service: Cardiovascular;  Laterality: N/A;  . CARDIAC CATHETERIZATION     with Angioplasty  . CARDIAC VALVE REPLACEMENT     Aortic Valve Replacement  . COLONOSCOPY WITH PROPOFOL N/A 03/18/2015   Procedure: COLONOSCOPY WITH PROPOFOL;  Surgeon: [name], MD;  Location: [facility] ENDOSCOPY;  Service: Endoscopy;  Laterality: N/A;  . ESOPHAGOGASTRODUODENOSCOPY N/A 03/18/2015   Procedure: ESOPHAGOGASTRODUODENOSCOPY (EGD);  Surgeon: [name], MD;  Location: [facility] ENDOSCOPY;  Service: Endoscopy;  Laterality: N/A;  . FOOT AMPUTATION    . PERIPHERAL VASCULAR INTERVENTION Right 06/20/2018   Procedure: PERIPHERAL VASCULAR INTERVENTION;  Surgeon: Wellington Hampshire, MD;  Location: [place] [facility];  Service: Cardiovascular;  Laterality: Right;  . THORACOTOMY Right   . TONSILLECTOMY      His family history includes Diabetes in his brother; Heart attack in his father and mother; Hypertension in his brother; Obesity in his son.      Current Outpatient Medications:  .  amLODipine-benazepril (LOTREL) 5-40 MG capsule, Take 1 capsule by mouth daily., Disp: 90 capsule, Rfl: 3 .  aspirin EC 81 MG tablet, Take 1 tablet (81 mg total) by mouth daily., Disp: 30 tablet, Rfl: 2 .  B-D UF III MINI PEN NEEDLES 31G X 5 MM MISC, USE WITH PEN DAILY, Disp: 100 each, Rfl: 3 .  Blood Glucose Monitoring Suppl (ONE TOUCH ULTRA SYSTEM KIT) w/Device KIT, To use daily to check sugar. DX E11.9-needs one touch ultra meter, Disp: 1 each, Rfl: 0 .  Cholecalciferol (VITAMIN D3) 2000 units TABS, Take 2,000 Units by mouth daily., Disp: , Rfl:  .  clopidogrel (PLAVIX) 75 MG tablet, Take 1 tablet (75 mg total) by mouth daily., Disp: 30 tablet, Rfl: 6 .  Coenzyme Q10 100 MG TABS, Take 100 mg by mouth daily. , Disp: , Rfl:  .  diphenhydrAMINE (BENADRYL) 50 MG tablet, Take one 50 mg tablet the morning of the procedure with the last dose of the prednisone. (Patient not taking: Reported on  08/10/2018), Disp: 1 tablet, Rfl: 0 .  fluticasone (FLONASE) 50 MCG/ACT nasal spray, USE 2 SPRAYS IN EACH NOSTRIL EVERY DAY (Patient taking differently: Place 2 sprays into both nostrils daily. ), Disp: 48 g, Rfl: 3 .  glucose blood (ONE TOUCH ULTRA TEST) test strip, Check sugar 3 times daily. DX E11.9-strips for one touch ultra meter, Disp: 100 each, Rfl: 11 .  JARDIANCE 25 MG TABS tablet, TAKE 1 TABLET BY MOUTH DAILY (Patient taking differently: Take 25 mg by mouth daily. ), Disp: 30 tablet, Rfl: 10 .  liraglutide (VICTOZA) 18 MG/3ML SOPN, INJECT 1.2 SUBCUTANEOUSLY EVERY DAY (Patient taking differently: Inject 0.6 mg into the skin daily. INJECT 1.2 SUBCUTANEOUSLY EVERY DAY), Disp: 9 pen, Rfl: 3 .  metFORMIN (GLUCOPHAGE) 500 MG tablet, TAKE 1 TABLET (500 MG TOTAL) BY MOUTH 2 (TWO)  TIMES DAILY WITH A MEAL., Disp: 180 tablet, Rfl: 3 .  metoprolol succinate (TOPROL-XL) 100 MG 24 hr tablet, TAKE 1 TABLET BY MOUTH EVERY DAY (Patient taking differently: Take 100 mg by mouth daily. ), Disp: 90 tablet, Rfl: 3 .  montelukast (SINGULAIR) 10 MG tablet, Take 1 tablet (10 mg total) by mouth daily., Disp: 90 tablet, Rfl: 3 .  omeprazole (PRILOSEC) 40 MG capsule, TAKE ONE CAPSULE BY MOUTH EVERY DAY (Patient taking differently: Take 40 mg by mouth daily. ), Disp: 90 capsule, Rfl: 3 .  ONETOUCH DELICA LANCETS FINE MISC, 1 Device by Does not apply route 3 (three) times daily., Disp: 100 each, Rfl: 11 .  rosuvastatin (CRESTOR) 20 MG tablet, TAKE 1 TABLET BY MOUTH EVERY DAY (Patient taking differently: Take 20 mg by mouth daily. ), Disp: 90 tablet, Rfl: 2 .  sildenafil (VIAGRA) 100 MG tablet, USE AS DIRECTED, Disp: , Rfl:  .  vitamin B-12 (CYANOCOBALAMIN) 1000 MCG tablet, Take 1,000 mcg by mouth daily., Disp: , Rfl:  .  zinc gluconate 50 MG tablet, Take 50 mg by mouth daily., Disp: , Rfl:   Patient Care Team: [name]., MD as PCP - General (Family Medicine) Corey Skains, MD as Consulting Physician  (Cardiology) Ralene Bathe, MD as Consulting Physician (Dermatology) Wellington Hampshire, MD as Consulting Physician (Cardiology)     Objective:   Vitals: There were no vitals taken for this visit.  Physical Exam  Constitutional: He is oriented to person, place, and time. He appears well-developed and well-nourished.  HENT:  Head: Normocephalic and atraumatic.  Right Ear: External ear normal.  Left Ear: External ear normal.  Nose: Nose normal.  Mouth/Throat: Oropharynx is clear and moist.  Upper and lower dentures in place.  Eyes: Conjunctivae are normal. No scleral icterus.  Neck: No thyromegaly present.  Cardiovascular: Normal rate, regular rhythm, normal heart sounds and intact distal pulses.  Pulmonary/Chest: Effort normal and breath sounds normal.  Abdominal: Soft.  Musculoskeletal: He exhibits no edema.  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  Neurological: He is alert and oriented to person, place, and time.  Skin: Skin is warm and dry.  Psychiatric: He has a normal mood and affect. His behavior is normal. Judgment and thought content normal.    Activities of Daily Living In your present state of health, do you have any difficulty performing the following activities: 08/10/2018  Hearing? Y  Comment Does not wear hearing aids.  Vision? N  Comment Wears eye glasses.  Difficulty concentrating or making decisions? N  Walking or climbing stairs? N  Dressing or bathing? N  Doing errands, shopping? N  Preparing Food and eating ? N  Using the Toilet? N  In the past six months, have you accidently leaked urine? N  Do you have problems with loss of bowel control? N  Managing your Medications? N  Managing your Finances? N  Housekeeping or managing your Housekeeping? N  Some recent data might be hidden    Fall Risk Assessment Fall Risk  08/10/2018 05/18/2017 05/16/2016 08/24/2015  Falls in the past year? 0 No No No     Depression Screen PHQ 2/9 Scores 08/10/2018 05/18/2017  05/16/2016 08/24/2015  PHQ - 2 Score 0 0 0 0    Assessment & Plan:    Annual Physical Reviewed patient's Family Medical History Reviewed and updated list of patient's medical providers Assessment of cognitive impairment was done Assessed patient's functional ability Established a written schedule for health screening  services Health Risk Assessent Completed and Reviewed  Exercise Activities and Dietary recommendations Goals    . DIET - REDUCE CALORIE INTAKE     Recommend to continue cutting back on starches to help aid in weight loss and better diabetes control.     . Reduce portion size     Recommend eating 3 small meals a day and two healthy protein snacks in between.        Immunization History  Administered Date(s) Administered  . Influenza, High Dose Seasonal PF 08/05/2016, 05/22/2017, 07/13/2018  . Influenza,inj,Quad PF,6+ Mos 09/11/2015  . Pneumococcal Conjugate-13 08/14/2014  . Pneumococcal Polysaccharide-23 08/02/2007, 10/22/2012  . Tdap 06/09/2010  . Zoster 10/22/2012    Health Maintenance  Topic Date Due  . FOOT EXAM  01/23/2018  . OPHTHALMOLOGY EXAM  10/11/2018  . HEMOGLOBIN A1C  10/27/2018  . COLONOSCOPY  03/17/2020  . TETANUS/TDAP  06/09/2020  . INFLUENZA VACCINE  Completed  . Hepatitis C Screening  Completed  . PNA vac Low Risk Adult  Completed     Discussed health benefits of physical activity, and encouraged him to engage in regular exercise appropriate for his age and condition.  1. Annual physical   2. Type 2 diabetes mellitus with complication, without long-term current use of insulin (HCC)  - CBC with Differential/Platelet - Hemoglobin A1c  3. Essential hypertension  - Comprehensive metabolic panel - TSH  4. Class 2 severe obesity due to excess calories with serious comorbidity and body mass index (BMI) of 38.0 to 38.9 in adult [facility]) Continue to work on diet and exercise.  5. Pure hypercholesterolemia  - Lipid panel  6.  Arteriosclerosis of coronary artery PAD also.  Patient recently had atherectomy in his right leg to help his stump heal.  7. BPH Pt recently had urolift for BPH symptoms.  8. Obstructive apnea   9. Barrett's esophagus with dysplasia   10. Type 2 diabetes mellitus with other circulatory complication, without long-term current use of insulin ([place])   11. Traumatic amputation of right lower extremity, sequela ([place])   12. H/O adenomatous polyp of colon 2021 colonoscopy.  13. Benign fibroma of prostate  I have done the exam and reviewed the chart and it is accurate to the best of my knowledge. Development worker, community has been used and  any errors in dictation or transcription are unintentional. Miguel Aschoff M.D. [facility]    ------------------------------------------------------------------------------------------------------------    [name], MD  [facility]

## 2018-08-22 DIAGNOSIS — E118 Type 2 diabetes mellitus with unspecified complications: Secondary | ICD-10-CM | POA: Diagnosis not present

## 2018-08-22 DIAGNOSIS — I10 Essential (primary) hypertension: Secondary | ICD-10-CM | POA: Diagnosis not present

## 2018-08-22 DIAGNOSIS — E7800 Pure hypercholesterolemia, unspecified: Secondary | ICD-10-CM | POA: Diagnosis not present

## 2018-08-23 LAB — LIPID PANEL
Chol/HDL Ratio: 4 ratio (ref 0.0–5.0)
Cholesterol, Total: 133 mg/dL (ref 100–199)
HDL: 33 mg/dL — ABNORMAL LOW
LDL Calculated: 65 mg/dL (ref 0–99)
Triglycerides: 174 mg/dL — ABNORMAL HIGH (ref 0–149)
VLDL Cholesterol Cal: 35 mg/dL (ref 5–40)

## 2018-08-23 LAB — HEMOGLOBIN A1C
ESTIMATED AVERAGE GLUCOSE: 120 mg/dL
HEMOGLOBIN A1C: 5.8 % — AB (ref 4.8–5.6)

## 2018-08-23 LAB — COMPREHENSIVE METABOLIC PANEL
ALT: 11 IU/L (ref 0–44)
AST: 16 IU/L (ref 0–40)
Albumin/Globulin Ratio: 2.6 — ABNORMAL HIGH (ref 1.2–2.2)
Albumin: 4.9 g/dL — ABNORMAL HIGH (ref 3.5–4.8)
Alkaline Phosphatase: 100 IU/L (ref 39–117)
BUN/Creatinine Ratio: 16 (ref 10–24)
BUN: 18 mg/dL (ref 8–27)
Bilirubin Total: 1.3 mg/dL — ABNORMAL HIGH (ref 0.0–1.2)
CALCIUM: 9.9 mg/dL (ref 8.6–10.2)
CO2: 22 mmol/L (ref 20–29)
CREATININE: 1.14 mg/dL (ref 0.76–1.27)
Chloride: 100 mmol/L (ref 96–106)
GFR calc Af Amer: 74 mL/min/{1.73_m2} (ref >59)
GFR, EST NON AFRICAN AMERICAN: 64 mL/min/{1.73_m2} (ref >59)
GLUCOSE: 133 mg/dL — AB (ref 65–99)
Globulin, Total: 1.9 g/dL (ref 1.5–4.5)
Potassium: 4.4 mmol/L (ref 3.5–5.2)
Sodium: 140 mmol/L (ref 134–144)
Total Protein: 6.8 g/dL (ref 6.0–8.5)

## 2018-08-23 LAB — CBC WITH DIFFERENTIAL/PLATELET
BASOS ABS: 0.1 10*3/uL (ref 0.0–0.2)
Basos: 1 %
EOS (ABSOLUTE): 0.2 10*3/uL (ref 0.0–0.4)
Eos: 3 %
Hematocrit: 42.2 % (ref 37.5–51.0)
Hemoglobin: 14.8 g/dL (ref 13.0–17.7)
IMMATURE GRANULOCYTES: 0 %
Immature Grans (Abs): 0 10*3/uL (ref 0.0–0.1)
Lymphocytes Absolute: 1.4 10*3/uL (ref 0.7–3.1)
Lymphs: 20 %
MCH: 31.4 pg (ref 26.6–33.0)
MCHC: 35.1 g/dL (ref 31.5–35.7)
MCV: 90 fL (ref 79–97)
MONOS ABS: 0.7 10*3/uL (ref 0.1–0.9)
Monocytes: 10 %
NEUTROS PCT: 66 %
Neutrophils Absolute: 4.7 10*3/uL (ref 1.4–7.0)
PLATELETS: 197 10*3/uL (ref 150–450)
RBC: 4.71 x10E6/uL (ref 4.14–5.80)
RDW: 14.3 % (ref 12.3–15.4)
WBC: 7.1 10*3/uL (ref 3.4–10.8)

## 2018-08-23 LAB — TSH: TSH: 2.45 u[IU]/mL (ref 0.450–4.500)

## 2018-08-28 DIAGNOSIS — N401 Enlarged prostate with lower urinary tract symptoms: Secondary | ICD-10-CM | POA: Diagnosis not present

## 2018-09-03 ENCOUNTER — Other Ambulatory Visit: Payer: Self-pay | Admitting: Family Medicine

## 2018-09-03 DIAGNOSIS — E118 Type 2 diabetes mellitus with unspecified complications: Secondary | ICD-10-CM

## 2018-09-03 DIAGNOSIS — I10 Essential (primary) hypertension: Secondary | ICD-10-CM

## 2018-09-04 ENCOUNTER — Other Ambulatory Visit: Payer: Self-pay | Admitting: Family Medicine

## 2018-09-04 DIAGNOSIS — E118 Type 2 diabetes mellitus with unspecified complications: Secondary | ICD-10-CM

## 2018-09-04 DIAGNOSIS — I10 Essential (primary) hypertension: Secondary | ICD-10-CM

## 2018-09-05 DIAGNOSIS — D692 Other nonthrombocytopenic purpura: Secondary | ICD-10-CM | POA: Diagnosis not present

## 2018-09-05 DIAGNOSIS — L812 Freckles: Secondary | ICD-10-CM | POA: Diagnosis not present

## 2018-09-05 DIAGNOSIS — D229 Melanocytic nevi, unspecified: Secondary | ICD-10-CM | POA: Diagnosis not present

## 2018-09-05 DIAGNOSIS — L578 Other skin changes due to chronic exposure to nonionizing radiation: Secondary | ICD-10-CM | POA: Diagnosis not present

## 2018-09-05 DIAGNOSIS — Z1283 Encounter for screening for malignant neoplasm of skin: Secondary | ICD-10-CM | POA: Diagnosis not present

## 2018-09-05 DIAGNOSIS — D2230 Melanocytic nevi of unspecified part of face: Secondary | ICD-10-CM | POA: Diagnosis not present

## 2018-09-05 DIAGNOSIS — D485 Neoplasm of uncertain behavior of skin: Secondary | ICD-10-CM | POA: Diagnosis not present

## 2018-09-05 DIAGNOSIS — D225 Melanocytic nevi of trunk: Secondary | ICD-10-CM | POA: Diagnosis not present

## 2018-09-05 DIAGNOSIS — Z89511 Acquired absence of right leg below knee: Secondary | ICD-10-CM | POA: Diagnosis not present

## 2018-09-05 DIAGNOSIS — D1800 Hemangioma unspecified site: Secondary | ICD-10-CM | POA: Diagnosis not present

## 2018-09-05 DIAGNOSIS — L919 Hypertrophic disorder of the skin, unspecified: Secondary | ICD-10-CM | POA: Diagnosis not present

## 2018-09-06 DIAGNOSIS — R351 Nocturia: Secondary | ICD-10-CM | POA: Diagnosis not present

## 2018-09-06 DIAGNOSIS — N401 Enlarged prostate with lower urinary tract symptoms: Secondary | ICD-10-CM | POA: Diagnosis not present

## 2018-09-06 DIAGNOSIS — R350 Frequency of micturition: Secondary | ICD-10-CM | POA: Diagnosis not present

## 2018-09-07 DIAGNOSIS — M6530 Trigger finger, unspecified finger: Secondary | ICD-10-CM | POA: Diagnosis not present

## 2018-09-14 DIAGNOSIS — R350 Frequency of micturition: Secondary | ICD-10-CM | POA: Diagnosis not present

## 2018-09-14 DIAGNOSIS — R351 Nocturia: Secondary | ICD-10-CM | POA: Diagnosis not present

## 2018-09-14 DIAGNOSIS — N401 Enlarged prostate with lower urinary tract symptoms: Secondary | ICD-10-CM | POA: Diagnosis not present

## 2018-09-20 ENCOUNTER — Telehealth: Payer: Self-pay | Admitting: Cardiovascular Disease

## 2018-09-20 NOTE — Telephone Encounter (Signed)
I see him for PV only.  Dr. [name] is his cardiologist.  He needs to obtain cardiac clearance from Dr. [name]. From my standpoint, hold Plavix 7 days before surgery.  He should continue aspirin 81 mg once daily if possible given previous cardiac stenting.

## 2018-09-20 NOTE — Telephone Encounter (Signed)
° °  [facility] Pre-operative Risk Assessment    Request for surgical clearance:  1. What type of surgery is being performed? Prostate surgery    2. When is this surgery scheduled? 10/16/18   3. What type of clearance is required (medical clearance vs. Pharmacy clearance to hold med vs. Both)? Pharmacy   4. Are there any medications that need to be held prior to surgery and how long? Plavix and ASA and for how long    5. Practice name and name of physician performing surgery? [facility], Dr. Royston Cowper   6. What is your office phone number [phone]    7.   What is your office fax number [phone]  8.   Anesthesia type (None, local, MAC, general) ? None listed   Ace Gins 09/20/2018, 11:46 AM  _________________________________________________________________   (provider comments below)

## 2018-09-21 DIAGNOSIS — M25512 Pain in left shoulder: Secondary | ICD-10-CM | POA: Diagnosis not present

## 2018-09-21 DIAGNOSIS — M6530 Trigger finger, unspecified finger: Secondary | ICD-10-CM | POA: Diagnosis not present

## 2018-09-28 NOTE — Telephone Encounter (Signed)
Patient medication instructions faxed to [phone]. Cardiac clearance will need to be obtained from Dr. [name].

## 2018-10-01 ENCOUNTER — Other Ambulatory Visit: Payer: Self-pay | Admitting: Family Medicine

## 2018-10-02 ENCOUNTER — Other Ambulatory Visit: Payer: Self-pay

## 2018-10-02 ENCOUNTER — Encounter
Admission: RE | Admit: 2018-10-02 | Discharge: 2018-10-02 | Disposition: A | Discharge status: Home / Self Care | Payer: PPO | Source: Ambulatory Visit | Attending: Urology | Admitting: Urology

## 2018-10-02 DIAGNOSIS — Z01812 Encounter for preprocedural laboratory examination: Secondary | ICD-10-CM | POA: Insufficient documentation

## 2018-10-02 HISTORY — DX: Gastro-esophageal reflux disease without esophagitis: K21.9

## 2018-10-02 NOTE — Patient Instructions (Addendum)
Your procedure is scheduled on: 10/16/18 Tues Report to Same Day Surgery 2nd floor medical mall [facility] Entrance-take elevator on left to 2nd floor.  Check in with surgery information desk.) To find out your arrival time please call [phone] between 1PM - 3PM on 10/15/18 Mon  Remember: Instructions that are not followed completely may result in serious medical risk, up to and including death, or upon the discretion of your surgeon and anesthesiologist your surgery may need to be rescheduled.    _x___ 1. Do not eat food after midnight the night before your procedure. You may drink clear liquids up to 2 hours before you are scheduled to arrive at the hospital for your procedure.  Do not drink clear liquids within 2 hours of your scheduled arrival to the hospital.  Clear liquids include  --Water or Apple juice without pulp  --Clear carbohydrate beverage such as ClearFast or Gatorade  --Black Coffee or Clear Tea (No milk, no creamers, do not add anything to                  the coffee or Tea Type 1 and type 2 diabetics should only drink water.   ____Ensure clear carbohydrate drink on the way to the hospital for bariatric patients  ____Ensure clear carbohydrate drink 3 hours before surgery for Dr Dwyane Luo patients if physician instructed.   No gum chewing or hard candies.     __x__ 2. No Alcohol for 24 hours before or after surgery.   __x__3. No Smoking or e-cigarettes for 24 prior to surgery.  Do not use any chewable tobacco products for at least 6 hour prior to surgery   ____  4. Bring all medications with you on the day of surgery if instructed.    __x__ 5. Notify your doctor if there is any change in your medical condition     (cold, fever, infections).    x___6. On the morning of surgery brush your teeth with toothpaste and water.  You may rinse your mouth with mouth wash if you wish.  Do not swallow any toothpaste or mouthwash.   Do not wear jewelry, make-up, hairpins,  clips or nail polish.  Do not wear lotions, powders, or perfumes. You may wear deodorant.  Do not shave 48 hours prior to surgery. Men may shave face and neck.  Do not bring valuables to the hospital.    [facility] is not responsible for any belongings or valuables.               Contacts, dentures or bridgework may not be worn into surgery.  Leave your suitcase in the car. After surgery it may be brought to your room.  For patients admitted to the hospital, discharge time is determined by your                       treatment team.  _  Patients discharged the day of surgery will not be allowed to drive home.  You will need someone to drive you home and stay with you the night of your procedure.    Please read over the following fact sheets that you were given:   [facility] Preparing for Surgery and or MRSA Information   _x___ Take anti-hypertensive listed below, cardiac, seizure, asthma,     anti-reflux and psychiatric medicines. These include:  1. metoprolol succinate (TOPROL-XL) 100 MG 24 hr tablet  2.omeprazole (PRILOSEC) 40 MG capsule  3.rosuvastatin (CRESTOR) 20 MG  tablet  4.montelukast (SINGULAIR) 10 MG tablet  5.  6.  ____Fleets enema or Magnesium Citrate as directed.   _x___ Use CHG Soap or sage wipes as directed on instruction sheet   ____ Use inhalers on the day of surgery and bring to hospital day of surgery  __x__ Stop Metformin and Janumet 2 days prior to surgery.    ____ Take 1/2 of usual insulin dose the night before surgery and none on the morning     surgery.   _x___ Follow recommendations from Cardiologist, Pulmonologist or PCP regarding          stopping Aspirin, Coumadin, Plavix ,Eliquis, Effient, or Pradaxa, and Pletal.  X____Stop Anti-inflammatories such as Advil, Aleve, Ibuprofen, Motrin, Naproxen, Naprosyn, Goodies powders or aspirin products. OK to take Tylenol and                          Celebrex.   _x___ Stop supplements until after surgery.  But  may continue Vitamin D, Vitamin B,       and multivitamin. Stop co Q 10 week before surgery  _x___ Bring C-Pap to the hospital.
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
## 2018-10-04 DIAGNOSIS — E782 Mixed hyperlipidemia: Secondary | ICD-10-CM | POA: Diagnosis not present

## 2018-10-04 DIAGNOSIS — I2510 Atherosclerotic heart disease of native coronary artery without angina pectoris: Secondary | ICD-10-CM | POA: Diagnosis not present

## 2018-10-04 DIAGNOSIS — Q231 Congenital insufficiency of aortic valve: Secondary | ICD-10-CM | POA: Diagnosis not present

## 2018-10-04 DIAGNOSIS — I10 Essential (primary) hypertension: Secondary | ICD-10-CM | POA: Diagnosis not present

## 2018-10-04 NOTE — Pre-Procedure Instructions (Signed)
Clearance by dr [name] on chart

## 2018-10-16 ENCOUNTER — Ambulatory Visit: Payer: PPO | Admitting: Anesthesiology

## 2018-10-16 ENCOUNTER — Encounter: Payer: Self-pay | Admitting: *Deleted

## 2018-10-16 ENCOUNTER — Other Ambulatory Visit: Payer: Self-pay

## 2018-10-16 ENCOUNTER — Ambulatory Visit
Admission: RE | Admit: 2018-10-16 | Discharge: 2018-10-16 | Disposition: A | Discharge status: Home / Self Care | Payer: PPO | Attending: Urology | Admitting: Urology

## 2018-10-16 ENCOUNTER — Encounter
Admission: RE | Disposition: A | Discharge status: Home / Self Care | Payer: Self-pay | Source: Home / Self Care | Attending: Urology

## 2018-10-16 DIAGNOSIS — E7800 Pure hypercholesterolemia, unspecified: Secondary | ICD-10-CM | POA: Insufficient documentation

## 2018-10-16 DIAGNOSIS — Z7984 Long term (current) use of oral hypoglycemic drugs: Secondary | ICD-10-CM | POA: Diagnosis not present

## 2018-10-16 DIAGNOSIS — K219 Gastro-esophageal reflux disease without esophagitis: Secondary | ICD-10-CM | POA: Diagnosis not present

## 2018-10-16 DIAGNOSIS — Z7951 Long term (current) use of inhaled steroids: Secondary | ICD-10-CM | POA: Insufficient documentation

## 2018-10-16 DIAGNOSIS — N138 Other obstructive and reflux uropathy: Secondary | ICD-10-CM | POA: Diagnosis not present

## 2018-10-16 DIAGNOSIS — Z888 Allergy status to other drugs, medicaments and biological substances status: Secondary | ICD-10-CM | POA: Insufficient documentation

## 2018-10-16 DIAGNOSIS — I2510 Atherosclerotic heart disease of native coronary artery without angina pectoris: Secondary | ICD-10-CM | POA: Insufficient documentation

## 2018-10-16 DIAGNOSIS — Z7982 Long term (current) use of aspirin: Secondary | ICD-10-CM | POA: Insufficient documentation

## 2018-10-16 DIAGNOSIS — Z8249 Family history of ischemic heart disease and other diseases of the circulatory system: Secondary | ICD-10-CM | POA: Diagnosis not present

## 2018-10-16 DIAGNOSIS — Z953 Presence of xenogenic heart valve: Secondary | ICD-10-CM | POA: Insufficient documentation

## 2018-10-16 DIAGNOSIS — E119 Type 2 diabetes mellitus without complications: Secondary | ICD-10-CM | POA: Insufficient documentation

## 2018-10-16 DIAGNOSIS — I252 Old myocardial infarction: Secondary | ICD-10-CM | POA: Diagnosis not present

## 2018-10-16 DIAGNOSIS — Z79899 Other long term (current) drug therapy: Secondary | ICD-10-CM | POA: Diagnosis not present

## 2018-10-16 DIAGNOSIS — Q231 Congenital insufficiency of aortic valve: Secondary | ICD-10-CM

## 2018-10-16 DIAGNOSIS — Z955 Presence of coronary angioplasty implant and graft: Secondary | ICD-10-CM | POA: Diagnosis not present

## 2018-10-16 DIAGNOSIS — N401 Enlarged prostate with lower urinary tract symptoms: Secondary | ICD-10-CM | POA: Diagnosis not present

## 2018-10-16 DIAGNOSIS — G4730 Sleep apnea, unspecified: Secondary | ICD-10-CM | POA: Diagnosis not present

## 2018-10-16 DIAGNOSIS — Z87891 Personal history of nicotine dependence: Secondary | ICD-10-CM | POA: Insufficient documentation

## 2018-10-16 DIAGNOSIS — I358 Other nonrheumatic aortic valve disorders: Secondary | ICD-10-CM | POA: Diagnosis not present

## 2018-10-16 DIAGNOSIS — J449 Chronic obstructive pulmonary disease, unspecified: Secondary | ICD-10-CM | POA: Insufficient documentation

## 2018-10-16 DIAGNOSIS — E782 Mixed hyperlipidemia: Secondary | ICD-10-CM | POA: Diagnosis not present

## 2018-10-16 DIAGNOSIS — N400 Enlarged prostate without lower urinary tract symptoms: Secondary | ICD-10-CM | POA: Diagnosis not present

## 2018-10-16 DIAGNOSIS — I10 Essential (primary) hypertension: Secondary | ICD-10-CM | POA: Diagnosis not present

## 2018-10-16 HISTORY — PX: GREEN LIGHT LASER TURP (TRANSURETHRAL RESECTION OF PROSTATE: SHX6260

## 2018-10-16 LAB — GLUCOSE, CAPILLARY
Glucose-Capillary: 193 mg/dL — ABNORMAL HIGH (ref 70–99)
Glucose-Capillary: 157 mg/dL — ABNORMAL HIGH (ref 70–99)

## 2018-10-16 SURGERY — GREEN LIGHT LASER TURP (TRANSURETHRAL RESECTION OF PROSTATE
Anesthesia: General

## 2018-10-16 MED ORDER — URIBEL 118 MG PO CAPS: 1.0000 | ORAL_CAPSULE | Freq: Four times a day (QID) | ORAL | 3 refills | Status: DC | PRN

## 2018-10-16 MED ORDER — BELLADONNA ALKALOIDS-OPIUM 16.2-60 MG RE SUPP
RECTAL | Status: AC
  Filled 2018-10-16: qty 1

## 2018-10-16 MED ORDER — LEVOFLOXACIN IN D5W 500 MG/100ML IV SOLN
INTRAVENOUS | Status: AC
  Filled 2018-10-16: qty 100

## 2018-10-16 MED ORDER — MIDAZOLAM HCL 2 MG/2ML IJ SOLN
INTRAMUSCULAR | Status: AC
  Filled 2018-10-16: qty 2

## 2018-10-16 MED ORDER — PHENYLEPHRINE HCL 10 MG/ML IJ SOLN
INTRAMUSCULAR | Status: DC | PRN
  Administered 2018-10-16 (×2): 50 ug via INTRAVENOUS
  Administered 2018-10-16 (×2): 100 ug via INTRAVENOUS

## 2018-10-16 MED ORDER — SUCCINYLCHOLINE CHLORIDE 20 MG/ML IJ SOLN
INTRAMUSCULAR | Status: DC | PRN
  Administered 2018-10-16: 120 mg via INTRAVENOUS

## 2018-10-16 MED ORDER — LIDOCAINE HCL URETHRAL/MUCOSAL 2 % EX GEL
CUTANEOUS | Status: AC
  Filled 2018-10-16: qty 10

## 2018-10-16 MED ORDER — LIDOCAINE HCL (PF) 2 % IJ SOLN
INTRAMUSCULAR | Status: AC
  Filled 2018-10-16: qty 10

## 2018-10-16 MED ORDER — MIDAZOLAM HCL 2 MG/2ML IJ SOLN
INTRAMUSCULAR | Status: DC | PRN
  Administered 2018-10-16: 2 mg via INTRAVENOUS

## 2018-10-16 MED ORDER — FENTANYL CITRATE (PF) 100 MCG/2ML IJ SOLN
INTRAMUSCULAR | Status: DC | PRN
  Administered 2018-10-16: 100 ug via INTRAVENOUS

## 2018-10-16 MED ORDER — OXYCODONE HCL 5 MG/5ML PO SOLN: 5.0000 mg | Freq: Once | ORAL | Status: DC | PRN

## 2018-10-16 MED ORDER — ROCURONIUM BROMIDE 50 MG/5ML IV SOLN
INTRAVENOUS | Status: AC
  Filled 2018-10-16: qty 1

## 2018-10-16 MED ORDER — SODIUM CHLORIDE 0.9 % IV SOLN
INTRAVENOUS | Status: DC
  Administered 2018-10-16: 14:00:00 via INTRAVENOUS

## 2018-10-16 MED ORDER — HYOSCYAMINE SULFATE SL 0.125 MG SL SUBL: 0.1250 mg | SUBLINGUAL_TABLET | SUBLINGUAL | 1 refills | Status: DC | PRN

## 2018-10-16 MED ORDER — LIDOCAINE HCL (CARDIAC) PF 100 MG/5ML IV SOSY
PREFILLED_SYRINGE | INTRAVENOUS | Status: DC | PRN
  Administered 2018-10-16: 100 mg via INTRAVENOUS

## 2018-10-16 MED ORDER — CIPROFLOXACIN HCL 500 MG PO TABS: 500.0000 mg | ORAL_TABLET | Freq: Two times a day (BID) | ORAL | 0 refills | Status: DC

## 2018-10-16 MED ORDER — BELLADONNA ALKALOIDS-OPIUM 16.2-60 MG RE SUPP
RECTAL | Status: DC | PRN
  Administered 2018-10-16: 1 via RECTAL

## 2018-10-16 MED ORDER — SUCCINYLCHOLINE CHLORIDE 20 MG/ML IJ SOLN
INTRAMUSCULAR | Status: AC
  Filled 2018-10-16: qty 1

## 2018-10-16 MED ORDER — FENTANYL CITRATE (PF) 100 MCG/2ML IJ SOLN: 25.0000 ug | INTRAMUSCULAR | Status: DC | PRN

## 2018-10-16 MED ORDER — LIDOCAINE HCL URETHRAL/MUCOSAL 2 % EX GEL
CUTANEOUS | Status: DC | PRN
  Administered 2018-10-16: 1

## 2018-10-16 MED ORDER — SEVOFLURANE IN SOLN
RESPIRATORY_TRACT | Status: AC
  Filled 2018-10-16: qty 250

## 2018-10-16 MED ORDER — ROCURONIUM BROMIDE 100 MG/10ML IV SOLN
INTRAVENOUS | Status: DC | PRN
  Administered 2018-10-16: 30 mg via INTRAVENOUS

## 2018-10-16 MED ORDER — MEPERIDINE HCL 50 MG/ML IJ SOLN: 6.2500 mg | INTRAMUSCULAR | Status: DC | PRN

## 2018-10-16 MED ORDER — PROPOFOL 10 MG/ML IV BOLUS
INTRAVENOUS | Status: AC
  Filled 2018-10-16: qty 20

## 2018-10-16 MED ORDER — ONDANSETRON HCL 4 MG/2ML IJ SOLN
INTRAMUSCULAR | Status: DC | PRN
  Administered 2018-10-16: 4 mg via INTRAVENOUS

## 2018-10-16 MED ORDER — FENTANYL CITRATE (PF) 100 MCG/2ML IJ SOLN
INTRAMUSCULAR | Status: AC
  Filled 2018-10-16: qty 2

## 2018-10-16 MED ORDER — PROMETHAZINE HCL 25 MG/ML IJ SOLN: 6.2500 mg | INTRAMUSCULAR | Status: DC | PRN

## 2018-10-16 MED ORDER — LEVOFLOXACIN IN D5W 500 MG/100ML IV SOLN
500.0000 mg | Freq: Once | INTRAVENOUS | Status: AC
  Administered 2018-10-16: 500 mg via INTRAVENOUS

## 2018-10-16 MED ORDER — PROPOFOL 10 MG/ML IV BOLUS
INTRAVENOUS | Status: DC | PRN
  Administered 2018-10-16: 150 mg via INTRAVENOUS

## 2018-10-16 MED ORDER — OXYCODONE HCL 5 MG PO TABS: 5.0000 mg | ORAL_TABLET | Freq: Once | ORAL | Status: DC | PRN

## 2018-10-16 SURGICAL SUPPLY — 20 items
ADAPTER IRRIG TUBE 2 SPIKE SOL (ADAPTER) ×6 IMPLANT
BAG URINE DRAINAGE (UROLOGICAL SUPPLIES) ×3 IMPLANT
CATH FOLEY 2WAY  5CC 20FR SIL (CATHETERS) ×2
CATH FOLEY 2WAY 5CC 20FR SIL (CATHETERS) IMPLANT
GLOVE BIO SURGEON STRL SZ7.5 (GLOVE) ×3 IMPLANT
GOWN STRL REUS W/ TWL LRG LVL3 (GOWN DISPOSABLE) ×1 IMPLANT
GOWN STRL REUS W/ TWL XL LVL3 (GOWN DISPOSABLE) ×1 IMPLANT
GOWN STRL REUS W/TWL LRG LVL3 (GOWN DISPOSABLE) ×2
GOWN STRL REUS W/TWL XL LVL3 (GOWN DISPOSABLE) ×2
IV NS 1000ML (IV SOLUTION) ×2
IV NS 1000ML BAXH (IV SOLUTION) ×1 IMPLANT
IV SET PRIMARY 15D 139IN B9900 (IV SETS) ×3 IMPLANT
KIT TURNOVER CYSTO (KITS) ×3 IMPLANT
PACK CYSTO AR (MISCELLANEOUS) ×3 IMPLANT
SET IRRIG Y TYPE TUR BLADDER L (SET/KITS/TRAYS/PACK) ×3 IMPLANT
SOL .9 NS 3000ML IRR  AL (IV SOLUTION) ×8
SOL .9 NS 3000ML IRR UROMATIC (IV SOLUTION) ×4 IMPLANT
SURGILUBE 2OZ TUBE FLIPTOP (MISCELLANEOUS) ×3 IMPLANT
SYRINGE IRR TOOMEY STRL 70CC (SYRINGE) ×3 IMPLANT
WATER STERILE IRR 1000ML POUR (IV SOLUTION) ×3 IMPLANT

## 2018-10-16 NOTE — Anesthesia Preprocedure Evaluation (Signed)
Anesthesia Evaluation  Patient identified by MRN, date of birth, ID band Patient awake    Reviewed: Allergy & Precautions, NPO status , Patient's Chart, lab work & pertinent test results  History of Anesthesia Complications Negative for: history of anesthetic complications  Airway Mallampati: III  TM Distance: >3 FB Neck ROM: Full    Dental  (+) Edentulous Upper, Edentulous Lower   Pulmonary sleep apnea , COPD,  COPD inhaler, former smoker,    breath sounds clear to auscultation- rhonchi (-) wheezing      Cardiovascular hypertension, + CAD, + Past MI and + Cardiac Stents  (-) CABG + Valvular Problems/Murmurs (s/p AVR)  Rhythm:Regular Rate:Normal - Systolic murmurs and - Diastolic murmurs    Neuro/Psych neg Seizures negative psych ROS   GI/Hepatic Neg liver ROS, GERD  ,  Endo/Other  diabetes, Oral Hypoglycemic Agents  Renal/GU negative Renal ROS     Musculoskeletal  (+) Arthritis ,   Abdominal (+) + obese,   Peds  Hematology negative hematology ROS (+)   Anesthesia Other Findings Past Medical History: No date: 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 No date: Barrett's esophagus with dysplasia No date: COPD (chronic obstructive pulmonary disease) (HCC) No date: Coronary artery disease No date: Diabetes mellitus without complication (HCC) No date: GERD (gastroesophageal reflux disease) No date: Hepatitis No date: Hyperlipidemia No date: Hypertension No date: Myocardial infarction ([place]) No date: Seizures ([place]) No date: Sleep apnea   Reproductive/Obstetrics                             Anesthesia Physical Anesthesia Plan  ASA: III  Anesthesia Plan: General   Post-op Pain Management:    Induction: Intravenous  PONV Risk Score and Plan: 1 and Ondansetron and Midazolam  Airway Management Planned: Oral ETT  Additional Equipment:   Intra-op Plan:   Post-operative Plan:  Extubation in OR  Informed Consent: I have reviewed the patients History and Physical, chart, labs and discussed the procedure including the risks, benefits and alternatives for the proposed anesthesia with the patient or authorized representative who has indicated his/her understanding and acceptance.     Dental advisory given  Plan Discussed with: CRNA and Anesthesiologist  Anesthesia Plan Comments:         Anesthesia Quick Evaluation

## 2018-10-16 NOTE — Op Note (Signed)
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 Yves Dill MD  Anesthesia: General  Indications:See the history and physical. After informed consent the above procedure(s) were requested     Technique and findings: After adequate general anesthesia been obtained the patient was placed into dorsal lithotomy position and the perineum was prepped and draped in the usual fashion.  The laser scope was coupled with a camera and visually advanced into the bladder.  No bladder lesions were identified.  Both ureteral orifices were identified and had clear efflux.  The patient had right lateral lobe protrusion into the prostatic fossa causing visual obstruction.  The left side was indented consistent with prior UroLift procedure.  At this point the greenlight XPS laser fiber was introduced to the scope and set at 53 W of power.  The obstructing tissue on the right side was vaporized.  Bleeders were controlled with the coagulative setting.  2 UroLift clips were excised with the laser and removed using grasping forceps on the right side.  At this point the laser scope was removed.  10 cc of viscous Xylocaine was instilled within the urethra.  A 20 French silicone catheter was placed and irrigated until clear.  A B&O suppository was placed.  The procedure was then terminated and patient transferred to the recovery room in stable condition.Marland Kitchen

## 2018-10-16 NOTE — Transfer of Care (Signed)
Immediate Anesthesia Transfer of Care Note  Patient: Andrew Lyons  Procedure(s) Performed: GREEN LIGHT LASER TURP (TRANSURETHRAL RESECTION OF PROSTATE (N/A )  Patient Location: PACU  Anesthesia Type:General  Level of Consciousness: sedated  Airway & Oxygen Therapy: Patient Spontanous Breathing and Patient connected to face mask oxygen  Post-op Assessment: Report given to RN and Post -op Vital signs reviewed and stable  Post vital signs: Reviewed and stable  Last Vitals:  Vitals Value Taken Time  BP 129/65 10/16/2018  3:25 PM  Temp    Pulse 69 10/16/2018  3:25 PM  Resp 16 10/16/2018  3:25 PM  SpO2 99 % 10/16/2018  3:25 PM  Vitals shown include unvalidated device data.  Last Pain:  Vitals:   10/16/18 1310  TempSrc: Oral  PainSc: 0-No pain         Complications: No apparent anesthesia complications

## 2018-10-16 NOTE — Anesthesia Procedure Notes (Signed)
Procedure Name: Intubation Date/Time: 10/16/2018 2:30 PM Performed by: Rudean Hitt, CRNA Pre-anesthesia Checklist: Patient identified, Patient being monitored, Timeout performed, Emergency Drugs available and Suction available Patient Re-evaluated:Patient Re-evaluated prior to induction Oxygen Delivery Method: Circle system utilized Preoxygenation: Pre-oxygenation with 100% oxygen Induction Type: IV induction Ventilation: Two handed mask ventilation required and Oral airway inserted - appropriate to patient size Laryngoscope Size: Mac, 3 and 4 Grade View: Grade II Tube type: Oral Tube size: 7.5 mm Number of attempts: 1 Airway Equipment and Method: Stylet Placement Confirmation: ETT inserted through vocal cords under direct vision,  positive ETCO2 and breath sounds checked- equal and bilateral Secured at: 23 cm Tube secured with: Tape Dental Injury: Teeth and Oropharynx as per pre-operative assessment

## 2018-10-16 NOTE — Discharge Instructions (Signed)
Benign Prostatic Hyperplasia  Benign prostatic hyperplasia (BPH) is an enlarged prostate gland that is caused by the normal aging process and not by cancer. The prostate is a walnut-sized gland that is involved in the production of semen. It is located in front of the rectum and below the bladder. The bladder stores urine and the urethra is the tube that carries the urine out of the body. The prostate may get bigger as a man gets older. An enlarged prostate can press on the urethra. This can make it harder to pass urine. The build-up of urine in the bladder can cause infection. Back pressure and infection may progress to bladder damage and kidney (renal) failure. What are the causes? This condition is part of a normal aging process. However, not all men develop problems from this condition. If the prostate enlarges away from the urethra, urine flow will not be blocked. If it enlarges toward the urethra and compresses it, there will be problems passing urine. What increases the risk? This condition is more likely to develop in men over the age of 57 years. What are the signs or symptoms? Symptoms of this condition include:  Getting up often during the night to urinate.  Needing to urinate frequently during the day.  Difficulty starting urine flow.  Decrease in size and strength of your urine stream.  Leaking (dribbling) after urinating.  Inability to pass urine. This needs immediate treatment.  Inability to completely empty your bladder.  Pain when you pass urine. This is more common if there is also an infection.  Urinary tract infection (UTI). How is this diagnosed? This condition is diagnosed based on your medical history, a physical exam, and your symptoms. Tests will also be done, such as:  A post-void bladder scan. This measures any amount of urine that may remain in your bladder after you finish urinating.  A digital rectal exam. In a rectal exam, your health care provider  checks your prostate by putting a lubricated, gloved finger into your rectum to feel the back of your prostate gland. This exam detects the size of your gland and any abnormal lumps or growths.  An exam of your urine (urinalysis).  A prostate specific antigen (PSA) screening. This is a blood test used to screen for prostate cancer.  An ultrasound. This test uses sound waves to electronically produce a picture of your prostate gland. Your health care provider may refer you to a specialist in kidney and prostate diseases (urologist). How is this treated? Once symptoms begin, your health care provider will monitor your condition (active surveillance or watchful waiting). Treatment for this condition will depend on the severity of your condition. Treatment may include:  Observation and yearly exams. This may be the only treatment needed if your condition and symptoms are mild.  Medicines to relieve your symptoms, including: ? Medicines to shrink the prostate. ? Medicines to relax the muscle of the prostate.  Surgery in severe cases. Surgery may include: ? Prostatectomy. In this procedure, the prostate tissue is removed completely through an open incision or with a laparascope or robotics. ? Transurethral resection of the prostate (TURP). In this procedure, a tool is inserted through the opening at the tip of the penis (urethra). It is used to cut away tissue of the inner core of the prostate. The pieces are removed through the same opening of the penis. This removes the blockage. ? Transurethral incision (TUIP). In this procedure, small cuts are made in the prostate. This lessens  the prostate's pressure on the urethra. ? Transurethral microwave thermotherapy (TUMT). This procedure uses microwaves to create heat. The heat destroys and removes a small amount of prostate tissue. ? Transurethral needle ablation (TUNA). This procedure uses radio frequencies to destroy and remove a small amount of  prostate tissue. ? Interstitial laser coagulation ([place]). This procedure uses a laser to destroy and remove a small amount of prostate tissue. ? Transurethral electrovaporization (TUVP). This procedure uses electrodes to destroy and remove a small amount of prostate tissue. ? Prostatic urethral lift. This procedure inserts an implant to push the lobes of the prostate away from the urethra. Follow these instructions at home:  Take over-the-counter and prescription medicines only as told by your health care provider.  Monitor your symptoms for any changes. Contact your health care provider with any changes.  Avoid drinking large amounts of liquid before going to bed or out in public.  Avoid or reduce how much caffeine or alcohol you drink.  Give yourself time when you urinate.  Keep all follow-up visits as told by your health care provider. This is important. Contact a health care provider if:  You have unexplained back pain.  Your symptoms do not get better with treatment.  You develop side effects from the medicine you are taking.  Your urine becomes very dark or has a bad smell.  Your lower abdomen becomes distended and you have trouble passing your urine. Get help right away if:  You have a fever or chills.  You suddenly cannot urinate.  You feel lightheaded, or very dizzy, or you faint.  There are large amounts of blood or clots in the urine.  Your urinary problems become hard to manage.  You develop moderate to severe low back or flank pain. The flank is the side of your body between the ribs and the hip. These symptoms may represent a serious problem that is an emergency. Do not wait to see if the symptoms will go away. Get medical help right away. Call your local emergency services (911 in the [place] Do not drive yourself to the hospital. Summary  Benign prostatic hyperplasia (BPH) is an enlarged prostate that is caused by the normal aging process and not by  cancer.  An enlarged prostate can press on the urethra. This can make it hard to pass urine.  This condition is part of a normal aging process and is more likely to develop in men over the age of 42 years.  Get help right away if you suddenly cannot urinate. This information is not intended to replace advice given to you by your health care provider. Make sure you discuss any questions you have with your health care provider. Document Released: 09/12/2005 Document Revised: 10/17/2016 Document Reviewed: 10/17/2016 Elsevier Interactive Patient Education  2019 [place] Light Laser Prostate Treatment, Care After This sheet gives you information about how to care for yourself after your procedure. Your health care provider may also give you more specific instructions. If you have problems or questions, contact your health care provider. What can I expect after the procedure? After the procedure, it is common to have:  Swelling and discomfort around your urethra. The opening of the urethra is at the end of the penis.  Blood in your urine. This should go away after a few days.  Trouble urinating or sudden need to urinate (urgency). These problems should get better over time. You may continue to have a thin tube (catheter) inserted into your urethra  to help drain your urine from your bladder for a few days after the procedure. Follow these instructions at home: Medicines  Take over-the-counter and prescription medicines only as told by your health care provider.  If you were prescribed an antibiotic medicine, take it as told by your health care provider. Do not stop taking the antibiotic even if you start to feel better. Bathing  Do not take baths, swim, or use a hot tub until your health care provider approves. Ask your health care provider if you may take showers. You may only be allowed to take sponge baths. Activity   Do not drive for 24 hours if you were given a medicine to  help you relax (sedative) during your procedure.  Do not drive or use heavy machinery while taking prescription pain medicine.  Ask your health care provider what activities are safe for you. Most people can return to normal activities within a few days. ? Do not have sex or engage in sexual activity until your health care provider approves. ? Do not lift anything that is heavier than 10 lb (4.5 kg), or the limit that you are told, until your health care provider says that it is safe. General instructions      If you have a urinary catheter, care for it as told by your health care provider. This may include: ? Washing your hands before and after touching the catheter. ? Emptying your drainage bag when it is ?- full, or emptying it at least 2-3 times a day. ? Keeping the area around the catheter clean and dry. ? Avoiding any bends or breaks in the catheter. ? Keeping air out of the catheter. ? Making sure that the catheter is not placed under water.  Do not use any products that contain nicotine or tobacco, such as cigarettes and e-cigarettes. If you need help quitting, ask your health care provider.  Drink enough fluid to keep your urine pale yellow.  Keep all follow-up visits as told by your health care provider. This is important. Contact a health care provider if:  You have trouble: ? Having a bowel movement. ? Getting an erection.  You have swelling around your urethra and it gets worse.  You have blood in your urine for more than 2 days after the procedure.  You have pain or burning when you urinate, or other problems that do not go away or cause discomfort.  You have problems with your catheter or your catheter is blocked.  You have a fever.  You have nausea or you vomit.  You have swelling in your legs. Get help right away if:  Your urine has blood clots in it.  Your urine is dark red.  You cannot urinate after your catheter is removed.  You have blood in  your stool.  You have severe pain that does not get better with medicine.  You have shortness of breath. Summary  After the procedure, it is common to have swelling and discomfort around your urethra and blood in your urine for a few days.  Some men may have problems urinating after this procedure. These problems should go away after a few days. If you have pain or burning while urinating, contact your health care provider.  If you have a catheter after this procedure, care for it as told by your health care provider.  If you have severe pain, dark red urine, or urine with blood clots, get medical help right away. This information is not  intended to replace advice given to you by your health care provider. Make sure you discuss any questions you have with your health care provider. Document Released: 03/23/2017 Document Revised: 03/23/2017 Document Reviewed: 03/23/2017 Elsevier Interactive Patient Education  2019 [employer].

## 2018-10-16 NOTE — H&P (Signed)
Date of Initial H&P: 10/03/18  History reviewed, patient examined, no change in status, stable for surgery.

## 2018-10-16 NOTE — Anesthesia Post-op Follow-up Note (Signed)
Anesthesia QCDR form completed.        

## 2018-10-16 NOTE — Progress Notes (Signed)
Pt was given foley catheter instructions and the wife/patient stated they understood.

## 2018-10-17 ENCOUNTER — Encounter: Payer: Self-pay | Admitting: Urology

## 2018-10-18 NOTE — Anesthesia Postprocedure Evaluation (Signed)
Anesthesia Post Note  Patient: Andrew Lyons  Procedure(s) Performed: GREEN LIGHT LASER TURP (TRANSURETHRAL RESECTION OF PROSTATE (N/A )  Patient location during evaluation: PACU Anesthesia Type: General Level of consciousness: awake and alert Pain management: pain level controlled Vital Signs Assessment: post-procedure vital signs reviewed and stable Respiratory status: spontaneous breathing, nonlabored ventilation, respiratory function stable and patient connected to nasal cannula oxygen Cardiovascular status: blood pressure returned to baseline and stable Postop Assessment: no apparent nausea or vomiting Anesthetic complications: no     Last Vitals:  Vitals:   10/16/18 1620 10/16/18 1708  BP: 127/78 (!) 143/78  Pulse: (!) 56 (!) 55  Resp:  18  Temp: (!) 36.1 C   SpO2: 100% 99%    Last Pain:  Vitals:   10/17/18 0839  TempSrc:   PainSc: 0-No pain                 Molli Barrows

## 2018-10-20 ENCOUNTER — Ambulatory Visit (INDEPENDENT_AMBULATORY_CARE_PROVIDER_SITE_OTHER): Payer: PPO | Admitting: Family Medicine

## 2018-10-20 ENCOUNTER — Encounter: Payer: Self-pay | Admitting: Family Medicine

## 2018-10-20 VITALS — BP 155/74 | HR 80 | Temp 98.0°F | Resp 16 | Wt 248.0 lb

## 2018-10-20 DIAGNOSIS — T8740 Infection of amputation stump, unspecified extremity: Secondary | ICD-10-CM | POA: Diagnosis not present

## 2018-10-20 DIAGNOSIS — S88911S Complete traumatic amputation of right lower leg, level unspecified, sequela: Secondary | ICD-10-CM | POA: Diagnosis not present

## 2018-10-20 MED ORDER — CEFUROXIME AXETIL 250 MG PO TABS: 250.0000 mg | ORAL_TABLET | Freq: Two times a day (BID) | ORAL | 0 refills | Status: DC
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
## 2018-10-23 ENCOUNTER — Encounter: Payer: PPO | Admitting: Physician Assistant

## 2018-10-23 DIAGNOSIS — I252 Old myocardial infarction: Secondary | ICD-10-CM

## 2018-10-23 DIAGNOSIS — L97313 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 of muscle: Secondary | ICD-10-CM | POA: Insufficient documentation

## 2018-10-23 DIAGNOSIS — E11622 Type 2 diabetes mellitus with other skin ulcer: Secondary | ICD-10-CM | POA: Insufficient documentation

## 2018-10-23 DIAGNOSIS — I2510 Atherosclerotic heart disease of native coronary artery without angina pectoris: Secondary | ICD-10-CM | POA: Diagnosis present

## 2018-10-23 DIAGNOSIS — I10 Essential (primary) hypertension: Secondary | ICD-10-CM | POA: Diagnosis present

## 2018-10-23 DIAGNOSIS — Z96698 Presence of other orthopedic joint implants: Secondary | ICD-10-CM | POA: Diagnosis present

## 2018-10-23 DIAGNOSIS — G4730 Sleep apnea, unspecified: Secondary | ICD-10-CM | POA: Insufficient documentation

## 2018-10-23 DIAGNOSIS — I350 Nonrheumatic aortic (valve) stenosis: Secondary | ICD-10-CM | POA: Diagnosis present

## 2018-10-23 DIAGNOSIS — E1151 Type 2 diabetes mellitus with diabetic peripheral angiopathy without gangrene: Secondary | ICD-10-CM

## 2018-10-23 DIAGNOSIS — L97819 Non-pressure chronic ulcer of other part of right lower leg with unspecified severity: Secondary | ICD-10-CM | POA: Diagnosis not present

## 2018-10-23 DIAGNOSIS — L89899 Pressure ulcer of other site, unspecified stage: Secondary | ICD-10-CM | POA: Diagnosis not present

## 2018-10-23 DIAGNOSIS — Z833 Family history of diabetes mellitus: Secondary | ICD-10-CM | POA: Diagnosis not present

## 2018-10-23 DIAGNOSIS — I739 Peripheral vascular disease, unspecified: Secondary | ICD-10-CM | POA: Diagnosis not present

## 2018-10-23 DIAGNOSIS — Z955 Presence of coronary angioplasty implant and graft: Secondary | ICD-10-CM | POA: Diagnosis not present

## 2018-10-23 DIAGNOSIS — I96 Gangrene, not elsewhere classified: Secondary | ICD-10-CM | POA: Diagnosis present

## 2018-10-23 DIAGNOSIS — I70238 Atherosclerosis of native arteries of right leg with ulceration of other part of lower right leg: Secondary | ICD-10-CM | POA: Diagnosis not present

## 2018-10-23 DIAGNOSIS — Z87891 Personal history of nicotine dependence: Secondary | ICD-10-CM | POA: Diagnosis not present

## 2018-10-23 DIAGNOSIS — T8484XA Pain due to internal orthopedic prosthetic devices, implants and grafts, initial encounter: Secondary | ICD-10-CM | POA: Diagnosis present

## 2018-10-23 DIAGNOSIS — M86661 Other chronic osteomyelitis, right tibia and fibula: Secondary | ICD-10-CM | POA: Diagnosis not present

## 2018-10-23 DIAGNOSIS — G8918 Other acute postprocedural pain: Secondary | ICD-10-CM | POA: Diagnosis present

## 2018-10-23 DIAGNOSIS — Z89511 Acquired absence of right leg below knee: Secondary | ICD-10-CM

## 2018-10-23 DIAGNOSIS — Z89431 Acquired absence of right foot: Secondary | ICD-10-CM | POA: Diagnosis not present

## 2018-10-23 DIAGNOSIS — E1152 Type 2 diabetes mellitus with diabetic peripheral angiopathy with gangrene: Secondary | ICD-10-CM | POA: Diagnosis present

## 2018-10-23 DIAGNOSIS — E860 Dehydration: Secondary | ICD-10-CM | POA: Diagnosis present

## 2018-10-23 DIAGNOSIS — M1990 Unspecified osteoarthritis, unspecified site: Secondary | ICD-10-CM

## 2018-10-23 DIAGNOSIS — I70209 Unspecified atherosclerosis of native arteries of extremities, unspecified extremity: Secondary | ICD-10-CM | POA: Diagnosis present

## 2018-10-23 DIAGNOSIS — G4733 Obstructive sleep apnea (adult) (pediatric): Secondary | ICD-10-CM | POA: Diagnosis present

## 2018-10-23 DIAGNOSIS — T84622A Infection and inflammatory reaction due to internal fixation device of right tibia, initial encounter: Secondary | ICD-10-CM | POA: Diagnosis not present

## 2018-10-23 DIAGNOSIS — E119 Type 2 diabetes mellitus without complications: Secondary | ICD-10-CM | POA: Diagnosis not present

## 2018-10-23 DIAGNOSIS — E669 Obesity, unspecified: Secondary | ICD-10-CM | POA: Diagnosis present

## 2018-10-23 DIAGNOSIS — E1140 Type 2 diabetes mellitus with diabetic neuropathy, unspecified: Secondary | ICD-10-CM

## 2018-10-23 DIAGNOSIS — T8753 Necrosis of amputation stump, right lower extremity: Secondary | ICD-10-CM | POA: Diagnosis present

## 2018-10-23 DIAGNOSIS — Z8249 Family history of ischemic heart disease and other diseases of the circulatory system: Secondary | ICD-10-CM | POA: Diagnosis not present

## 2018-10-23 DIAGNOSIS — E782 Mixed hyperlipidemia: Secondary | ICD-10-CM | POA: Diagnosis not present

## 2018-10-23 DIAGNOSIS — E11621 Type 2 diabetes mellitus with foot ulcer: Secondary | ICD-10-CM | POA: Diagnosis present

## 2018-10-23 DIAGNOSIS — Z9862 Peripheral vascular angioplasty status: Secondary | ICD-10-CM | POA: Diagnosis not present

## 2018-10-23 DIAGNOSIS — J449 Chronic obstructive pulmonary disease, unspecified: Secondary | ICD-10-CM

## 2018-10-23 DIAGNOSIS — K219 Gastro-esophageal reflux disease without esophagitis: Secondary | ICD-10-CM | POA: Diagnosis present

## 2018-10-23 DIAGNOSIS — L03115 Cellulitis of right lower limb: Secondary | ICD-10-CM | POA: Diagnosis present

## 2018-10-23 DIAGNOSIS — Z952 Presence of prosthetic heart valve: Secondary | ICD-10-CM | POA: Diagnosis not present

## 2018-10-23 DIAGNOSIS — E785 Hyperlipidemia, unspecified: Secondary | ICD-10-CM | POA: Diagnosis present

## 2018-10-24 ENCOUNTER — Encounter: Payer: Self-pay | Admitting: Emergency Medicine

## 2018-10-24 ENCOUNTER — Other Ambulatory Visit: Payer: Self-pay

## 2018-10-24 ENCOUNTER — Telehealth: Payer: Self-pay

## 2018-10-24 ENCOUNTER — Emergency Department: Payer: PPO

## 2018-10-24 DIAGNOSIS — Z79899 Other long term (current) drug therapy: Secondary | ICD-10-CM

## 2018-10-24 DIAGNOSIS — I10 Essential (primary) hypertension: Secondary | ICD-10-CM | POA: Diagnosis present

## 2018-10-24 DIAGNOSIS — Z91041 Radiographic dye allergy status: Secondary | ICD-10-CM

## 2018-10-24 DIAGNOSIS — I252 Old myocardial infarction: Secondary | ICD-10-CM

## 2018-10-24 DIAGNOSIS — Z952 Presence of prosthetic heart valve: Secondary | ICD-10-CM

## 2018-10-24 DIAGNOSIS — Z6835 Body mass index (BMI) 35.0-35.9, adult: Secondary | ICD-10-CM

## 2018-10-24 DIAGNOSIS — L03115 Cellulitis of right lower limb: Secondary | ICD-10-CM | POA: Diagnosis present

## 2018-10-24 DIAGNOSIS — Z833 Family history of diabetes mellitus: Secondary | ICD-10-CM

## 2018-10-24 DIAGNOSIS — Z7902 Long term (current) use of antithrombotics/antiplatelets: Secondary | ICD-10-CM

## 2018-10-24 DIAGNOSIS — E11621 Type 2 diabetes mellitus with foot ulcer: Secondary | ICD-10-CM | POA: Diagnosis present

## 2018-10-24 DIAGNOSIS — E785 Hyperlipidemia, unspecified: Secondary | ICD-10-CM | POA: Diagnosis present

## 2018-10-24 DIAGNOSIS — I2510 Atherosclerotic heart disease of native coronary artery without angina pectoris: Secondary | ICD-10-CM | POA: Diagnosis present

## 2018-10-24 DIAGNOSIS — Z7984 Long term (current) use of oral hypoglycemic drugs: Secondary | ICD-10-CM

## 2018-10-24 DIAGNOSIS — K219 Gastro-esophageal reflux disease without esophagitis: Secondary | ICD-10-CM | POA: Diagnosis present

## 2018-10-24 DIAGNOSIS — E1152 Type 2 diabetes mellitus with diabetic peripheral angiopathy with gangrene: Principal | ICD-10-CM | POA: Diagnosis present

## 2018-10-24 DIAGNOSIS — I350 Nonrheumatic aortic (valve) stenosis: Secondary | ICD-10-CM | POA: Diagnosis present

## 2018-10-24 DIAGNOSIS — G8918 Other acute postprocedural pain: Secondary | ICD-10-CM | POA: Diagnosis present

## 2018-10-24 DIAGNOSIS — E669 Obesity, unspecified: Secondary | ICD-10-CM | POA: Diagnosis present

## 2018-10-24 DIAGNOSIS — T8484XA Pain due to internal orthopedic prosthetic devices, implants and grafts, initial encounter: Secondary | ICD-10-CM | POA: Diagnosis present

## 2018-10-24 DIAGNOSIS — I70209 Unspecified atherosclerosis of native arteries of extremities, unspecified extremity: Secondary | ICD-10-CM | POA: Diagnosis present

## 2018-10-24 DIAGNOSIS — Z8249 Family history of ischemic heart disease and other diseases of the circulatory system: Secondary | ICD-10-CM

## 2018-10-24 DIAGNOSIS — Z888 Allergy status to other drugs, medicaments and biological substances status: Secondary | ICD-10-CM

## 2018-10-24 DIAGNOSIS — Z89431 Acquired absence of right foot: Secondary | ICD-10-CM

## 2018-10-24 DIAGNOSIS — Z87891 Personal history of nicotine dependence: Secondary | ICD-10-CM

## 2018-10-24 DIAGNOSIS — Z96698 Presence of other orthopedic joint implants: Secondary | ICD-10-CM | POA: Diagnosis present

## 2018-10-24 DIAGNOSIS — J449 Chronic obstructive pulmonary disease, unspecified: Secondary | ICD-10-CM | POA: Diagnosis present

## 2018-10-24 DIAGNOSIS — E860 Dehydration: Secondary | ICD-10-CM | POA: Diagnosis present

## 2018-10-24 DIAGNOSIS — T8753 Necrosis of amputation stump, right lower extremity: Secondary | ICD-10-CM | POA: Diagnosis present

## 2018-10-24 DIAGNOSIS — Z955 Presence of coronary angioplasty implant and graft: Secondary | ICD-10-CM

## 2018-10-24 DIAGNOSIS — Z7982 Long term (current) use of aspirin: Secondary | ICD-10-CM

## 2018-10-24 DIAGNOSIS — G4733 Obstructive sleep apnea (adult) (pediatric): Secondary | ICD-10-CM | POA: Diagnosis present

## 2018-10-24 LAB — CBC WITH DIFFERENTIAL/PLATELET
Abs Immature Granulocytes: 0.02 10*3/uL (ref 0.00–0.07)
Basophils Absolute: 0.1 10*3/uL (ref 0.0–0.1)
Basophils Relative: 1 %
Eosinophils Absolute: 0.2 10*3/uL (ref 0.0–0.5)
Eosinophils Relative: 2 %
HCT: 42.3 % (ref 39.0–52.0)
HEMOGLOBIN: 14.5 g/dL (ref 13.0–17.0)
Immature Granulocytes: 0 %
Lymphocytes Relative: 16 %
Lymphs Abs: 1.3 10*3/uL (ref 0.7–4.0)
MCH: 30.9 pg (ref 26.0–34.0)
MCHC: 34.3 g/dL (ref 30.0–36.0)
MCV: 90.2 fL (ref 80.0–100.0)
MONO ABS: 0.9 10*3/uL (ref 0.1–1.0)
Monocytes Relative: 11 %
Neutro Abs: 5.7 10*3/uL (ref 1.7–7.7)
Neutrophils Relative %: 70 %
Platelets: 216 10*3/uL (ref 150–400)
RBC: 4.69 MIL/uL (ref 4.22–5.81)
RDW: 14.4 % (ref 11.5–15.5)
WBC: 8.1 10*3/uL (ref 4.0–10.5)
nRBC: 0 % (ref 0.0–0.2)

## 2018-10-24 LAB — COMPREHENSIVE METABOLIC PANEL
ALT: 10 U/L (ref 0–44)
AST: 14 U/L — ABNORMAL LOW (ref 15–41)
Albumin: 4.2 g/dL (ref 3.5–5.0)
Alkaline Phosphatase: 85 U/L (ref 38–126)
Anion gap: 10 (ref 5–15)
BUN: 24 mg/dL — ABNORMAL HIGH (ref 8–23)
CO2: 24 mmol/L (ref 22–32)
Calcium: 9 mg/dL (ref 8.9–10.3)
Chloride: 105 mmol/L (ref 98–111)
Creatinine, Ser: 1.14 mg/dL (ref 0.61–1.24)
GFR calc Af Amer: >60 mL/min (ref >60)
GFR calc non Af Amer: >60 mL/min (ref >60)
Glucose, Bld: 173 mg/dL — ABNORMAL HIGH (ref 70–99)
Potassium: 3.6 mmol/L (ref 3.5–5.1)
SODIUM: 139 mmol/L (ref 135–145)
Total Bilirubin: 1.2 mg/dL (ref 0.3–1.2)
Total Protein: 7.3 g/dL (ref 6.5–8.1)

## 2018-10-24 LAB — LACTIC ACID, PLASMA: Lactic Acid, Venous: 1.2 mmol/L (ref 0.5–1.9)

## 2018-10-24 NOTE — Progress Notes (Signed)
MIZRAIM, HARMENING ([id number]) Visit Report for 10/23/2018 Abuse/Suicide Risk Screen Details Patient Name: Andrew Lyons, Andrew Lyons Date of Service: 10/23/2018 8:45 AM Medical Record Number: [id number] Patient Account Number: [id number] Date of Birth/Sex: 1946-05-28 (73 y.o. M) Treating RN: Harold Barban Primary Care Airen Dales: [name], Delfino Lovett Other Clinician: Referring Markeisha Mancias: [name] Treating Justyne Roell/Extender: Melburn Hake, HOYT Weeks in Treatment: 0 Abuse/Suicide Risk Screen Items Answer ABUSE/SUICIDE RISK SCREEN: Has anyone close to you tried to hurt or harm you recentlyo No Do you feel uncomfortable with anyone in your familyo No Has anyone forced you do things that you didnot want to doo No Do you have any thoughts of harming yourselfo No Patient displays signs or symptoms of abuse and/or neglect. No Electronic Signature(s) Signed: 10/23/2018 5:16:49 PM By: Harold Barban Entered By: Harold Barban on 10/23/2018 08:43:39 Andrew Lyons ([id number]) -------------------------------------------------------------------------------- Activities of Daily Living Details Patient Name: Andrew Lyons Date of Service: 10/23/2018 8:45 AM Medical Record Number: [id number] Patient Account Number: [id number] Date of Birth/Sex: 09-08-1946 (72 y.o. M) Treating RN: Harold Barban Primary Care Aleighna Wojtas: [name], Delfino Lovett Other Clinician: Referring Dashel Goines: [name] Treating Shadell Brenn/Extender: Melburn Hake, HOYT Weeks in Treatment: 0 Activities of Daily Living Items Answer Activities of Daily Living (Please select one for each item) Drive Automobile Need Assistance Take Medications Completely Able Use Telephone Completely Able Care for Appearance Completely Able Use Toilet Completely Able Bath / Shower Completely Able Dress Self Completely Able Feed Self Completely Able Walk Need Assistance Get In / Out Bed Completely Able Housework Need  Assistance Prepare Meals Need Assistance Handle Money Completely Able Shop for Self Need Assistance Electronic Signature(s) Signed: 10/23/2018 5:16:49 PM By: Harold Barban Entered By: Harold Barban on 10/23/2018 08:44:17 Andrew Lyons ([id number]) -------------------------------------------------------------------------------- Education Assessment Details Patient Name: Andrew Lyons Date of Service: 10/23/2018 8:45 AM Medical Record Number: [id number] Patient Account Number: [id number] Date of Birth/Sex: 1945/11/28 (72 y.o. M) Treating RN: Harold Barban Primary Care Chelbi Herber: [name], Delfino Lovett Other Clinician: Referring Erinn Huskins: [name] Treating Edita Weyenberg/Extender: Sharalyn Ink in Treatment: 0 Primary Learner Assessed: Patient Learning Preferences/Education Level/Primary Language Learning Preference: Explanation Highest Education Level: High School Preferred Language: English Cognitive Barrier Assessment/Beliefs Language Barrier: No Translator Needed: No Memory Deficit: No Emotional Barrier: No Cultural/Religious Beliefs Affecting Medical Care: No Physical Barrier Assessment Impaired Vision: No Impaired Hearing: No Decreased Hand dexterity: No Knowledge/Comprehension Assessment Knowledge Level: High Comprehension Level: High Ability to understand written High instructions: Ability to understand verbal High instructions: Motivation Assessment Anxiety Level: Calm Cooperation: Cooperative Education Importance: Acknowledges Need Interest in Health Problems: Asks Questions Perception: Coherent Willingness to Engage in Self- High Management Activities: Readiness to Engage in Self- High Management Activities: Electronic Signature(s) Signed: 10/23/2018 5:16:49 PM By: Harold Barban Entered By: Harold Barban on 10/23/2018 08:44:40 Andrew Lyons  ([id number]) -------------------------------------------------------------------------------- Fall Risk Assessment Details Patient Name: Andrew Lyons Date of Service: 10/23/2018 8:45 AM Medical Record Number: [id number] Patient Account Number: [id number] Date of Birth/Sex: July 15, 1946 (73 y.o. M) Treating RN: Harold Barban Primary Care Emanuel Campos: [name], Delfino Lovett Other Clinician: Referring Dejean Tribby: [name], Delfino Lovett Treating Xitlally Mooneyham/Extender: Melburn Hake, HOYT Weeks in Treatment: 0 Fall Risk Assessment Items Have you had 2 or more falls in the last 12 monthso 0 No Have you had any fall that resulted in injury in the last 12 monthso 0 No FALL RISK ASSESSMENT: History of falling - immediate or within 3 months 0 No Secondary diagnosis 0 No Ambulatory aid None/bed rest/wheelchair/nurse  0 No Crutches/cane/walker 15 Yes Furniture 0 No IV Access/Saline Lock 0 No Gait/Training Normal/bed rest/immobile 0 No Weak 0 No Impaired 0 No Mental Status Oriented to own ability 0 Yes Electronic Signature(s) Signed: 10/23/2018 5:16:49 PM By: Harold Barban Entered By: Harold Barban on 10/23/2018 08:44:57 Andrew Lyons ([id number]) -------------------------------------------------------------------------------- Foot Assessment Details Patient Name: Andrew Lyons Date of Service: 10/23/2018 8:45 AM Medical Record Number: [id number] Patient Account Number: [id number] Date of Birth/Sex: July 30, 1946 (73 y.o. M) Treating RN: Harold Barban Primary Care Elgie Landino: [name], Delfino Lovett Other Clinician: Referring Wana Mount: [name] Treating Hiawatha Merriott/Extender: Melburn Hake, HOYT Weeks in Treatment: 0 Foot Assessment Items Site Locations + = Sensation present, - = Sensation absent, C = Callus, U = Ulcer R = Redness, W = Warmth, M = Maceration, PU = Pre-ulcerative lesion F = Fissure, S = Swelling, D = Dryness Assessment Right: Left: Other Deformity: No No Prior Foot  Ulcer: No No Prior Amputation: No No Charcot Joint: No No Ambulatory Status: Ambulatory With Help Assistance Device: Walker Gait: Steady Notes Unable to do Right foot, amputation. Electronic Signature(s) Signed: 10/23/2018 5:16:49 PM By: Harold Barban Entered By: Harold Barban on 10/23/2018 08:48:52 Andrew Lyons ([id number]) -------------------------------------------------------------------------------- Nutrition Risk Assessment Details Patient Name: Andrew Lyons Date of Service: 10/23/2018 8:45 AM Medical Record Number: [id number] Patient Account Number: [id number] Date of Birth/Sex: [birth year] (72 y.o. M) Treating RN: Harold Barban Primary Care Sylvan Lahm: [name], Delfino Lovett Other Clinician: Referring Chaun Uemura: [name], Delfino Lovett Treating Micaylah Bertucci/Extender: Melburn Hake, HOYT Weeks in Treatment: 0 Height (in): 70 Weight (lbs): 246 Body Mass Index (BMI): 35.3 Nutrition Risk Assessment Items NUTRITION RISK SCREEN: I have an illness or condition that made me change the kind and/or amount of 0 No food I eat I eat fewer than two meals per day 0 No I eat few fruits and vegetables, or milk products 0 No I have three or more drinks of beer, liquor or wine almost every day 0 No I have tooth or mouth problems that make it hard for me to eat 0 No I don't always have enough money to buy the food I need 0 No I eat alone most of the time 0 No I take three or more different prescribed or over-the-counter drugs a day 1 Yes Without wanting to, I have lost or gained 10 pounds in the last six months 0 No I am not always physically able to shop, cook and/or feed myself 0 No Nutrition Protocols Good Risk Protocol Moderate Risk Protocol Electronic Signature(s) Signed: 10/23/2018 5:16:49 PM By: Harold Barban Entered By: Harold Barban on 10/23/2018 08:45:08

## 2018-10-24 NOTE — Telephone Encounter (Signed)
Patient wanted to speak to [place] but unable to transfer call. Patient's wife [name] calling that patient was seen here on Saturday for stump infection and was prescribed antibiotic and also went to see the wound doctor yesterday and practically were told that they couldn't do anything at this moment because the skin was not open. She is calling because she would like to know what to do. Reports that the site started turning black a couple of days ago and is hurting. Patient took Tylenol.

## 2018-10-24 NOTE — ED Triage Notes (Signed)
Pt to triage via w/c with no distress noted; Right foot amputation many years ago; now with pain to stump; had balloon angioplasty on right leg recently and currently taking plavix; denies any injury or signs of infection; area of eschar noted to stump; rx antibiotic on Saturday

## 2018-10-25 ENCOUNTER — Other Ambulatory Visit (INDEPENDENT_AMBULATORY_CARE_PROVIDER_SITE_OTHER): Payer: Self-pay | Admitting: Vascular Surgery

## 2018-10-25 ENCOUNTER — Inpatient Hospital Stay
Admission: EM | Admit: 2018-10-25 | Discharge: 2018-10-30 | DRG: 240 | Disposition: A | Discharge status: Home Health | Payer: PPO | Attending: Internal Medicine | Admitting: Internal Medicine

## 2018-10-25 ENCOUNTER — Other Ambulatory Visit: Payer: Self-pay

## 2018-10-25 ENCOUNTER — Encounter
Admission: EM | Disposition: A | Discharge status: Home Health | Payer: Self-pay | Source: Home / Self Care | Attending: Internal Medicine

## 2018-10-25 DIAGNOSIS — T8484XA Pain due to internal orthopedic prosthetic devices, implants and grafts, initial encounter: Secondary | ICD-10-CM | POA: Diagnosis present

## 2018-10-25 DIAGNOSIS — G4733 Obstructive sleep apnea (adult) (pediatric): Secondary | ICD-10-CM | POA: Diagnosis present

## 2018-10-25 DIAGNOSIS — Z8249 Family history of ischemic heart disease and other diseases of the circulatory system: Secondary | ICD-10-CM | POA: Diagnosis not present

## 2018-10-25 DIAGNOSIS — E785 Hyperlipidemia, unspecified: Secondary | ICD-10-CM

## 2018-10-25 DIAGNOSIS — E669 Obesity, unspecified: Secondary | ICD-10-CM | POA: Diagnosis present

## 2018-10-25 DIAGNOSIS — J449 Chronic obstructive pulmonary disease, unspecified: Secondary | ICD-10-CM | POA: Diagnosis present

## 2018-10-25 DIAGNOSIS — L97819 Non-pressure chronic ulcer of other part of right lower leg with unspecified severity: Secondary | ICD-10-CM | POA: Diagnosis not present

## 2018-10-25 DIAGNOSIS — Z89431 Acquired absence of right foot: Secondary | ICD-10-CM | POA: Diagnosis not present

## 2018-10-25 DIAGNOSIS — I739 Peripheral vascular disease, unspecified: Secondary | ICD-10-CM | POA: Diagnosis not present

## 2018-10-25 DIAGNOSIS — Z952 Presence of prosthetic heart valve: Secondary | ICD-10-CM | POA: Diagnosis not present

## 2018-10-25 DIAGNOSIS — L03115 Cellulitis of right lower limb: Secondary | ICD-10-CM | POA: Diagnosis present

## 2018-10-25 DIAGNOSIS — I70238 Atherosclerosis of native arteries of right leg with ulceration of other part of lower right leg: Secondary | ICD-10-CM | POA: Diagnosis not present

## 2018-10-25 DIAGNOSIS — I96 Gangrene, not elsewhere classified: Secondary | ICD-10-CM | POA: Diagnosis present

## 2018-10-25 DIAGNOSIS — E860 Dehydration: Secondary | ICD-10-CM | POA: Diagnosis present

## 2018-10-25 DIAGNOSIS — I2510 Atherosclerotic heart disease of native coronary artery without angina pectoris: Secondary | ICD-10-CM

## 2018-10-25 DIAGNOSIS — T8743 Infection of amputation stump, right lower extremity: Secondary | ICD-10-CM

## 2018-10-25 DIAGNOSIS — E11628 Type 2 diabetes mellitus with other skin complications: Secondary | ICD-10-CM

## 2018-10-25 DIAGNOSIS — G8918 Other acute postprocedural pain: Secondary | ICD-10-CM | POA: Diagnosis present

## 2018-10-25 DIAGNOSIS — Z955 Presence of coronary angioplasty implant and graft: Secondary | ICD-10-CM | POA: Diagnosis not present

## 2018-10-25 DIAGNOSIS — Z9862 Peripheral vascular angioplasty status: Secondary | ICD-10-CM | POA: Diagnosis not present

## 2018-10-25 DIAGNOSIS — Z833 Family history of diabetes mellitus: Secondary | ICD-10-CM | POA: Diagnosis not present

## 2018-10-25 DIAGNOSIS — E1152 Type 2 diabetes mellitus with diabetic peripheral angiopathy with gangrene: Secondary | ICD-10-CM | POA: Diagnosis present

## 2018-10-25 DIAGNOSIS — E1151 Type 2 diabetes mellitus with diabetic peripheral angiopathy without gangrene: Secondary | ICD-10-CM | POA: Diagnosis not present

## 2018-10-25 DIAGNOSIS — E11621 Type 2 diabetes mellitus with foot ulcer: Secondary | ICD-10-CM | POA: Diagnosis present

## 2018-10-25 DIAGNOSIS — K219 Gastro-esophageal reflux disease without esophagitis: Secondary | ICD-10-CM | POA: Diagnosis present

## 2018-10-25 DIAGNOSIS — I10 Essential (primary) hypertension: Secondary | ICD-10-CM | POA: Diagnosis present

## 2018-10-25 DIAGNOSIS — I350 Nonrheumatic aortic (valve) stenosis: Secondary | ICD-10-CM | POA: Diagnosis present

## 2018-10-25 DIAGNOSIS — Z96698 Presence of other orthopedic joint implants: Secondary | ICD-10-CM | POA: Diagnosis present

## 2018-10-25 DIAGNOSIS — Z89511 Acquired absence of right leg below knee: Secondary | ICD-10-CM | POA: Diagnosis not present

## 2018-10-25 DIAGNOSIS — T8753 Necrosis of amputation stump, right lower extremity: Secondary | ICD-10-CM | POA: Diagnosis present

## 2018-10-25 DIAGNOSIS — I70209 Unspecified atherosclerosis of native arteries of extremities, unspecified extremity: Secondary | ICD-10-CM | POA: Diagnosis present

## 2018-10-25 DIAGNOSIS — I252 Old myocardial infarction: Secondary | ICD-10-CM | POA: Diagnosis not present

## 2018-10-25 DIAGNOSIS — Z87891 Personal history of nicotine dependence: Secondary | ICD-10-CM | POA: Diagnosis not present

## 2018-10-25 HISTORY — PX: LOWER EXTREMITY ANGIOGRAPHY: CATH118251

## 2018-10-25 LAB — GLUCOSE, CAPILLARY
Glucose-Capillary: 116 mg/dL — ABNORMAL HIGH (ref 70–99)
Glucose-Capillary: 114 mg/dL — ABNORMAL HIGH (ref 70–99)
Glucose-Capillary: 126 mg/dL — ABNORMAL HIGH (ref 70–99)
Glucose-Capillary: 182 mg/dL — ABNORMAL HIGH (ref 70–99)
Glucose-Capillary: 220 mg/dL — ABNORMAL HIGH (ref 70–99)
Glucose-Capillary: 259 mg/dL — ABNORMAL HIGH (ref 70–99)

## 2018-10-25 SURGERY — LOWER EXTREMITY ANGIOGRAPHY
Anesthesia: Moderate Sedation | Laterality: Right

## 2018-10-25 MED ORDER — VANCOMYCIN HCL 10 G IV SOLR
1500.0000 mg | INTRAVENOUS | Status: DC
  Administered 2018-10-26: 1500 mg via INTRAVENOUS
  Filled 2018-10-25 (×2): qty 1500

## 2018-10-25 MED ORDER — OXYCODONE HCL 5 MG PO TABS
ORAL_TABLET | ORAL | Status: AC
  Filled 2018-10-25: qty 1

## 2018-10-25 MED ORDER — ASPIRIN EC 81 MG PO TBEC
81.0000 mg | DELAYED_RELEASE_TABLET | Freq: Every day | ORAL | Status: DC
  Administered 2018-10-26 – 2018-10-30 (×5): 81 mg via ORAL
  Filled 2018-10-25 (×5): qty 1

## 2018-10-25 MED ORDER — METFORMIN HCL 500 MG PO TABS
500.0000 mg | ORAL_TABLET | Freq: Two times a day (BID) | ORAL | Status: DC
  Filled 2018-10-25: qty 1

## 2018-10-25 MED ORDER — CLOPIDOGREL BISULFATE 75 MG PO TABS: 75.0000 mg | ORAL_TABLET | Freq: Every day | ORAL | Status: DC

## 2018-10-25 MED ORDER — METHYLPREDNISOLONE SODIUM SUCC 125 MG IJ SOLR
125.0000 mg | INTRAMUSCULAR | Status: DC | PRN
  Administered 2018-10-25: 125 mg via INTRAVENOUS

## 2018-10-25 MED ORDER — SODIUM CHLORIDE 0.9 % IV SOLN
INTRAVENOUS | Status: DC
  Administered 2018-10-25 (×2): via INTRAVENOUS

## 2018-10-25 MED ORDER — DIPHENHYDRAMINE HCL 50 MG/ML IJ SOLN
50.0000 mg | Freq: Once | INTRAMUSCULAR | Status: AC
  Administered 2018-10-25: 25 mg via INTRAVENOUS

## 2018-10-25 MED ORDER — PIPERACILLIN-TAZOBACTAM 3.375 G IVPB
3.3750 g | Freq: Three times a day (TID) | INTRAVENOUS | Status: DC
  Administered 2018-10-25 – 2018-10-26 (×3): 3.375 g via INTRAVENOUS
  Filled 2018-10-25 (×3): qty 50

## 2018-10-25 MED ORDER — FAMOTIDINE 20 MG PO TABS
40.0000 mg | ORAL_TABLET | ORAL | Status: DC | PRN
  Administered 2018-10-25: 40 mg via ORAL

## 2018-10-25 MED ORDER — HEPARIN SODIUM (PORCINE) 1000 UNIT/ML IJ SOLN
INTRAMUSCULAR | Status: DC | PRN
  Administered 2018-10-25: 5000 [IU] via INTRAVENOUS

## 2018-10-25 MED ORDER — MIDAZOLAM HCL 2 MG/ML PO SYRP: 8.0000 mg | ORAL_SOLUTION | Freq: Once | ORAL | Status: DC | PRN

## 2018-10-25 MED ORDER — METOPROLOL SUCCINATE ER 50 MG PO TB24
100.0000 mg | ORAL_TABLET | Freq: Every day | ORAL | Status: DC
  Administered 2018-10-26 – 2018-10-30 (×4): 100 mg via ORAL
  Filled 2018-10-25 (×6): qty 2

## 2018-10-25 MED ORDER — ONDANSETRON HCL 4 MG/2ML IJ SOLN: 4.0000 mg | Freq: Four times a day (QID) | INTRAMUSCULAR | Status: DC | PRN

## 2018-10-25 MED ORDER — VITAMIN B-12 1000 MCG PO TABS
1000.0000 ug | ORAL_TABLET | Freq: Every day | ORAL | Status: DC
  Administered 2018-10-25 – 2018-10-30 (×6): 1000 ug via ORAL
  Filled 2018-10-25 (×6): qty 1

## 2018-10-25 MED ORDER — FAMOTIDINE 20 MG PO TABS
ORAL_TABLET | ORAL | Status: AC
  Administered 2018-10-25: 40 mg via ORAL
  Filled 2018-10-25: qty 2

## 2018-10-25 MED ORDER — SODIUM CHLORIDE 0.9 % IV SOLN: INTRAVENOUS | Status: DC

## 2018-10-25 MED ORDER — ACETAMINOPHEN 325 MG PO TABS: 650.0000 mg | ORAL_TABLET | Freq: Four times a day (QID) | ORAL | Status: DC | PRN

## 2018-10-25 MED ORDER — FENTANYL CITRATE (PF) 100 MCG/2ML IJ SOLN
INTRAMUSCULAR | Status: DC | PRN
  Administered 2018-10-25: 50 ug via INTRAVENOUS
  Administered 2018-10-25 (×2): 25 ug via INTRAVENOUS

## 2018-10-25 MED ORDER — MORPHINE SULFATE (PF) 2 MG/ML IV SOLN
2.0000 mg | INTRAVENOUS | Status: DC | PRN
  Administered 2018-10-25 (×2): 2 mg via INTRAVENOUS
  Filled 2018-10-25 (×2): qty 1

## 2018-10-25 MED ORDER — FENTANYL CITRATE (PF) 100 MCG/2ML IJ SOLN
INTRAMUSCULAR | Status: DC | PRN
  Administered 2018-10-25: 25 ug via INTRAVENOUS

## 2018-10-25 MED ORDER — MORPHINE SULFATE (PF) 4 MG/ML IV SOLN
4.0000 mg | Freq: Once | INTRAVENOUS | Status: AC
  Administered 2018-10-25: 4 mg via INTRAVENOUS
  Filled 2018-10-25: qty 1

## 2018-10-25 MED ORDER — MONTELUKAST SODIUM 10 MG PO TABS
10.0000 mg | ORAL_TABLET | Freq: Every day | ORAL | Status: DC
  Administered 2018-10-25 – 2018-10-30 (×6): 10 mg via ORAL
  Filled 2018-10-25 (×6): qty 1

## 2018-10-25 MED ORDER — PANTOPRAZOLE SODIUM 40 MG PO TBEC
40.0000 mg | DELAYED_RELEASE_TABLET | Freq: Every day | ORAL | Status: DC
  Administered 2018-10-25 – 2018-10-30 (×6): 40 mg via ORAL
  Filled 2018-10-25 (×6): qty 1

## 2018-10-25 MED ORDER — ONDANSETRON HCL 4 MG PO TABS: 4.0000 mg | ORAL_TABLET | Freq: Four times a day (QID) | ORAL | Status: DC | PRN

## 2018-10-25 MED ORDER — CLOPIDOGREL BISULFATE 75 MG PO TABS
75.0000 mg | ORAL_TABLET | Freq: Every day | ORAL | Status: DC
  Administered 2018-10-26 – 2018-10-30 (×5): 75 mg via ORAL
  Filled 2018-10-25 (×5): qty 1

## 2018-10-25 MED ORDER — VANCOMYCIN HCL 10 G IV SOLR
2000.0000 mg | Freq: Once | INTRAVENOUS | Status: AC
  Administered 2018-10-25: 2000 mg via INTRAVENOUS
  Filled 2018-10-25: qty 2000

## 2018-10-25 MED ORDER — HYDROMORPHONE HCL 1 MG/ML IJ SOLN: 1.0000 mg | Freq: Once | INTRAMUSCULAR | Status: DC | PRN

## 2018-10-25 MED ORDER — VANCOMYCIN HCL IN DEXTROSE 1-5 GM/200ML-% IV SOLN: 1000.0000 mg | Freq: Once | INTRAVENOUS | Status: DC

## 2018-10-25 MED ORDER — METHYLPREDNISOLONE SODIUM SUCC 125 MG IJ SOLR
INTRAMUSCULAR | Status: AC
  Administered 2018-10-25: 125 mg via INTRAVENOUS
  Filled 2018-10-25: qty 2

## 2018-10-25 MED ORDER — AMLODIPINE BESYLATE 5 MG PO TABS
5.0000 mg | ORAL_TABLET | Freq: Every day | ORAL | Status: DC
  Administered 2018-10-25 – 2018-10-27 (×3): 5 mg via ORAL
  Filled 2018-10-25 (×3): qty 1

## 2018-10-25 MED ORDER — ONDANSETRON HCL 4 MG/2ML IJ SOLN
4.0000 mg | INTRAMUSCULAR | Status: AC
  Administered 2018-10-25: 4 mg via INTRAVENOUS
  Filled 2018-10-25: qty 2

## 2018-10-25 MED ORDER — FENTANYL CITRATE (PF) 100 MCG/2ML IJ SOLN
INTRAMUSCULAR | Status: AC
  Filled 2018-10-25: qty 2

## 2018-10-25 MED ORDER — PIPERACILLIN-TAZOBACTAM 3.375 G IVPB 30 MIN
3.3750 g | Freq: Once | INTRAVENOUS | Status: AC
  Administered 2018-10-25: 3.375 g via INTRAVENOUS
  Filled 2018-10-25: qty 50

## 2018-10-25 MED ORDER — ENOXAPARIN SODIUM 40 MG/0.4ML ~~LOC~~ SOLN
40.0000 mg | SUBCUTANEOUS | Status: DC
  Administered 2018-10-26 – 2018-10-30 (×5): 40 mg via SUBCUTANEOUS
  Filled 2018-10-25 (×5): qty 0.4

## 2018-10-25 MED ORDER — DOCUSATE SODIUM 100 MG PO CAPS
100.0000 mg | ORAL_CAPSULE | Freq: Two times a day (BID) | ORAL | Status: DC
  Administered 2018-10-25 – 2018-10-28 (×7): 100 mg via ORAL
  Filled 2018-10-25 (×7): qty 1

## 2018-10-25 MED ORDER — MIDAZOLAM HCL 5 MG/5ML IJ SOLN
INTRAMUSCULAR | Status: AC
  Filled 2018-10-25: qty 5

## 2018-10-25 MED ORDER — MORPHINE SULFATE (PF) 4 MG/ML IV SOLN
4.0000 mg | INTRAVENOUS | Status: DC | PRN
  Administered 2018-10-25 – 2018-10-26 (×5): 4 mg via INTRAVENOUS
  Filled 2018-10-25 (×5): qty 1

## 2018-10-25 MED ORDER — MIDAZOLAM HCL 2 MG/2ML IJ SOLN
INTRAMUSCULAR | Status: DC | PRN
  Administered 2018-10-25: 2 mg via INTRAVENOUS
  Administered 2018-10-25 (×2): 1 mg via INTRAVENOUS

## 2018-10-25 MED ORDER — MIDAZOLAM HCL 2 MG/2ML IJ SOLN
INTRAMUSCULAR | Status: DC | PRN
  Administered 2018-10-25: 1 mg via INTRAVENOUS

## 2018-10-25 MED ORDER — INSULIN ASPART 100 UNIT/ML ~~LOC~~ SOLN: 0.0000 [IU] | Freq: Three times a day (TID) | SUBCUTANEOUS | Status: DC

## 2018-10-25 MED ORDER — OXYCODONE HCL 5 MG PO TABS
5.0000 mg | ORAL_TABLET | ORAL | Status: DC | PRN
  Administered 2018-10-25 – 2018-10-26 (×6): 5 mg via ORAL
  Filled 2018-10-25 (×7): qty 1

## 2018-10-25 MED ORDER — VITAMIN D 25 MCG (1000 UNIT) PO TABS
1000.0000 [IU] | ORAL_TABLET | Freq: Every day | ORAL | Status: DC
  Administered 2018-10-25 – 2018-10-30 (×6): 1000 [IU] via ORAL
  Filled 2018-10-25 (×6): qty 1

## 2018-10-25 MED ORDER — ROSUVASTATIN CALCIUM 10 MG PO TABS
20.0000 mg | ORAL_TABLET | Freq: Every day | ORAL | Status: DC
  Administered 2018-10-25 – 2018-10-29 (×4): 20 mg via ORAL
  Filled 2018-10-25 (×4): qty 2

## 2018-10-25 MED ORDER — HEPARIN SODIUM (PORCINE) 1000 UNIT/ML IJ SOLN
INTRAMUSCULAR | Status: AC
  Filled 2018-10-25: qty 1

## 2018-10-25 MED ORDER — ENOXAPARIN SODIUM 40 MG/0.4ML ~~LOC~~ SOLN: 40.0000 mg | SUBCUTANEOUS | Status: DC

## 2018-10-25 MED ORDER — DIPHENHYDRAMINE HCL 50 MG/ML IJ SOLN
INTRAMUSCULAR | Status: AC
  Administered 2018-10-25: 25 mg via INTRAVENOUS
  Filled 2018-10-25: qty 1

## 2018-10-25 MED ORDER — BENAZEPRIL HCL 20 MG PO TABS
40.0000 mg | ORAL_TABLET | Freq: Every day | ORAL | Status: DC
  Administered 2018-10-26: 40 mg via ORAL
  Filled 2018-10-25 (×3): qty 2

## 2018-10-25 MED ORDER — AMLODIPINE BESY-BENAZEPRIL HCL 5-40 MG PO CAPS: 1.0000 | ORAL_CAPSULE | Freq: Every day | ORAL | Status: DC

## 2018-10-25 MED ORDER — FLUTICASONE PROPIONATE 50 MCG/ACT NA SUSP
2.0000 | Freq: Every day | NASAL | Status: DC
  Filled 2018-10-25: qty 16

## 2018-10-25 MED ORDER — INSULIN ASPART 100 UNIT/ML ~~LOC~~ SOLN
0.0000 [IU] | Freq: Every day | SUBCUTANEOUS | Status: DC
  Administered 2018-10-25: 3 [IU] via SUBCUTANEOUS
  Filled 2018-10-25: qty 1

## 2018-10-25 MED ORDER — ACETAMINOPHEN 650 MG RE SUPP: 650.0000 mg | Freq: Four times a day (QID) | RECTAL | Status: DC | PRN

## 2018-10-25 MED ORDER — INSULIN ASPART 100 UNIT/ML ~~LOC~~ SOLN
0.0000 [IU] | Freq: Three times a day (TID) | SUBCUTANEOUS | Status: DC
  Administered 2018-10-25 – 2018-10-26 (×4): 5 [IU] via SUBCUTANEOUS
  Administered 2018-10-27: 2 [IU] via SUBCUTANEOUS
  Administered 2018-10-27: 3 [IU] via SUBCUTANEOUS
  Administered 2018-10-28 (×2): 5 [IU] via SUBCUTANEOUS
  Administered 2018-10-28 – 2018-10-30 (×5): 3 [IU] via SUBCUTANEOUS
  Filled 2018-10-25 (×13): qty 1

## 2018-10-25 MED ORDER — ASPIRIN EC 81 MG PO TBEC: 81.0000 mg | DELAYED_RELEASE_TABLET | Freq: Every day | ORAL | Status: DC

## 2018-10-25 MED ORDER — LIRAGLUTIDE 18 MG/3ML ~~LOC~~ SOPN: 1.2000 mg | PEN_INJECTOR | Freq: Every day | SUBCUTANEOUS | Status: DC

## 2018-10-25 SURGICAL SUPPLY — 17 items
BALLN LUTONIX 018 5X100X130 (BALLOONS) ×3
BALLN LUTONIX 018 5X220X130 (BALLOONS) ×3
BALLN ULTRVRSE 2.5X300X150 (BALLOONS) ×3
BALLOON LUTONIX 018 5X100X130 (BALLOONS) ×1 IMPLANT
BALLOON LUTONIX 018 5X220X130 (BALLOONS) ×1 IMPLANT
BALLOON ULTRVRSE 2.5X300X150 (BALLOONS) ×1 IMPLANT
CATH BEACON 5 .038 100 VERT TP (CATHETERS) ×3 IMPLANT
CATH PIG 70CM (CATHETERS) ×3 IMPLANT
DEVICE PRESTO INFLATION (MISCELLANEOUS) ×3 IMPLANT
DEVICE STARCLOSE SE CLOSURE (Vascular Products) ×3 IMPLANT
GLIDEWIRE ADV .035X260CM (WIRE) ×3 IMPLANT
PACK ANGIOGRAPHY (CUSTOM PROCEDURE TRAY) ×3 IMPLANT
SHEATH ANL2 6FRX45 HC (SHEATH) ×3 IMPLANT
SHEATH BRITE TIP 5FRX11 (SHEATH) ×3 IMPLANT
TUBING CONTRAST HIGH PRESS 72 (TUBING) ×3 IMPLANT
WIRE G V18X300CM (WIRE) ×3 IMPLANT
WIRE J 3MM .035X145CM (WIRE) ×3 IMPLANT
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
## 2018-10-25 NOTE — Care Management Note (Signed)
Case Management Note  Patient Details  Name: Andrew Lyons MRN: [id number] Date of Birth: [birth year]  Subjective/Objective:                  Met with the patient and his spouse to discuss DC plan and needs Lives at home with his wife Andrew Lyons Patient still drives and is independent Patient uses a knee scooter and crutches patient was provided with the [facility] list per [email or web address] BSC in place in the home, would not be able to use a walker due to balance Patient has Dr. Rosanna Randy for PCP Patient uses [facility] [address] Can afford medications with no problem Does not feel he needs and DME Continue to assess for [facility] needs and pick agency   Action/Plan: [facility] list provided, to pick agency in the event [facility] is needed   Expected Discharge Date:                  Expected Discharge Plan:     In-House Referral:     Discharge planning Services  CM Consult  Post Acute Care Choice:    Choice offered to:     DME Arranged:    DME Agency:     HH Arranged:    HH Agency:     Status of Service:  In process, will continue to follow  If discussed at Long Length of Stay Meetings, dates discussed:    Additional Comments:  Su Hilt, RN 10/25/2018, 11:31 AM

## 2018-10-25 NOTE — Progress Notes (Addendum)
GIANCARLOS, BERENDT ([id number]) Visit Report for 10/23/2018 Allergy List Details Patient Name: JGUADALUPE, OPIELA Date of Service: 10/23/2018 8:45 AM Medical Record Number: [id number] Patient Account Number: [id number] Date of Birth/Sex: 09/25/1946 (73 y.o. M) Treating RN: Harold Barban Primary Care Yanuel Tagg: [name], Delfino Lovett Other Clinician: Referring Nazaire Cordial: [name], Delfino Lovett Treating Friend Dorfman/Extender: Melburn Hake, HOYT Weeks in Treatment: 0 Allergies Active Allergies Zyban Zocor Allergy Notes Electronic Signature(s) Signed: 10/23/2018 5:16:49 PM By: Harold Barban Entered By: Harold Barban on 10/23/2018 08:43:07 [name] ([id number]) -------------------------------------------------------------------------------- Arrival Information Details Patient Name: [name] Date of Service: 10/23/2018 8:45 AM Medical Record Number: [id number] Patient Account Number: [id number] Date of Birth/Sex: [birth year] (73 y.o. M) Treating RN: Harold Barban Primary Care Adabelle Griffiths: [name], Delfino Lovett Other Clinician: Referring Khila Papp: [name] Treating Diannia Hogenson/Extender: Sharalyn Ink in Treatment: 0 Visit Information Patient Arrived: Walker Arrival Time: 08:36 Accompanied By: self Transfer Assistance: None Patient Identification Verified: Yes Secondary Verification Process Completed: Yes History Since Last Visit Added or deleted any medications: No Any new allergies or adverse reactions: No Had a fall or experienced change in activities of daily living that may affect risk of falls: No Signs or symptoms of abuse/neglect since last visito No Hospitalized since last visit: No Has Dressing in Place as Prescribed: No Has Compression in Place as Prescribed: No Pain Present Now: Yes Electronic Signature(s) Signed: 10/23/2018 5:16:49 PM By: Harold Barban Entered By: Harold Barban on 10/23/2018 08:39:10 [name]  ([id number]) -------------------------------------------------------------------------------- Clinic Level of Care Assessment Details Patient Name: [name] Date of Service: 10/23/2018 8:45 AM Medical Record Number: [id number] Patient Account Number: [id number] Date of Birth/Sex: 03/25/1946 (72 y.o. M) Treating RN: [name] Primary Care Datra Clary: [name], Delfino Lovett Other Clinician: Referring Amire Leazer: [name] Treating Eshal Propps/Extender: Melburn Hake, HOYT Weeks in Treatment: 0 Clinic Level of Care Assessment Items TOOL 2 Quantity Score []  - Use when only an EandM is performed on the INITIAL visit 0 ASSESSMENTS - Nursing Assessment / Reassessment X - General Physical Exam (combine w/ comprehensive assessment (listed just below) when 1 20 performed on new pt. evals) X- 1 25 Comprehensive Assessment (HX, ROS, Risk Assessments, Wounds Hx, etc.) ASSESSMENTS - Wound and Skin Assessment / Reassessment X - Simple Wound Assessment / Reassessment - one wound 1 5 []  - 0 Complex Wound Assessment / Reassessment - multiple wounds []  - 0 Dermatologic / Skin Assessment (not related to wound area) ASSESSMENTS - Ostomy and/or Continence Assessment and Care []  - Incontinence Assessment and Management 0 []  - 0 Ostomy Care Assessment and Management (repouching, etc.) PROCESS - Coordination of Care X - Simple Patient / Family Education for ongoing care 1 15 []  - 0 Complex (extensive) Patient / Family Education for ongoing care X- 1 10 Staff obtains Programmer, systems, Records, Test Results / Process Orders []  - 0 Staff telephones HHA, Nursing Homes / Clarify orders / etc []  - 0 Routine Transfer to another Facility (non-emergent condition) []  - 0 Routine Hospital Admission (non-emergent condition) []  - 0 New Admissions / Biomedical engineer / Ordering NPWT, Apligraf, etc. []  - 0 Emergency Hospital Admission (emergent condition) X- 1 10 Simple Discharge Coordination []  -  0 Complex (extensive) Discharge Coordination PROCESS - Special Needs []  - Pediatric / Minor Patient Management 0 []  - 0 Isolation Patient Management KEMONI, ORTEGA ([id number]) []  - 0 Hearing / Language / Visual special needs []  - 0 Assessment of Community assistance (transportation, D/C planning, etc.) []  - 0 Additional  assistance / Altered mentation []  - 0 Support Surface(s) Assessment (bed, cushion, seat, etc.) INTERVENTIONS - Wound Cleansing / Measurement X - Wound Imaging (photographs - any number of wounds) 1 5 []  - 0 Wound Tracing (instead of photographs) X- 1 5 Simple Wound Measurement - one wound []  - 0 Complex Wound Measurement - multiple wounds X- 1 5 Simple Wound Cleansing - one wound []  - 0 Complex Wound Cleansing - multiple wounds INTERVENTIONS - Wound Dressings X - Small Wound Dressing one or multiple wounds 1 10 []  - 0 Medium Wound Dressing one or multiple wounds []  - 0 Large Wound Dressing one or multiple wounds []  - 0 Application of Medications - injection INTERVENTIONS - Miscellaneous []  - External ear exam 0 []  - 0 Specimen Collection (cultures, biopsies, blood, body fluids, etc.) []  - 0 Specimen(s) / Culture(s) sent or taken to Lab for analysis []  - 0 Patient Transfer (multiple staff / Civil Service fast streamer / Similar devices) []  - 0 Simple Staple / Suture removal (25 or less) []  - 0 Complex Staple / Suture removal (26 or more) []  - 0 Hypo / Hyperglycemic Management (close monitor of Blood Glucose) []  - 0 Ankle / Brachial Index (ABI) - do not check if billed separately Has the patient been seen at the hospital within the last three years: Yes Total Score: 110 Level Of Care: New/Established - Level 3 Electronic Signature(s) Signed: 10/23/2018 4:47:13 PM By: [name] Entered By: [name] on 10/23/2018 09:24:27 [name] ([id number]) -------------------------------------------------------------------------------- Encounter  Discharge Information Details Patient Name: [name] Date of Service: 10/23/2018 8:45 AM Medical Record Number: [id number] Patient Account Number: [id number] Date of Birth/Sex: 1945/11/11 (73 y.o. M) Treating RN: [name] Primary Care Cadience Bradfield: [name], Delfino Lovett Other Clinician: Referring Aleyssa Pike: [name] Treating Stepahnie Campo/Extender: Melburn Hake, HOYT Weeks in Treatment: 0 Encounter Discharge Information Items Discharge Condition: Stable Ambulatory Status: Walker Discharge Destination: Home Transportation: Private Auto Accompanied By: self Schedule Follow-up Appointment: Yes Clinical Summary of Care: Notes patient using knee scooter Electronic Signature(s) Signed: 10/23/2018 4:47:13 PM By: [name] Entered By: [name] on 10/23/2018 09:25:15 [name] ([id number]) -------------------------------------------------------------------------------- Lower Extremity Assessment Details Patient Name: [name] Date of Service: 10/23/2018 8:45 AM Medical Record Number: [id number] Patient Account Number: [id number] Date of Birth/Sex: [birth year] (72 y.o. M) Treating RN: Harold Barban Primary Care Kahmari Herard: [name], Delfino Lovett Other Clinician: Referring Ryin Ambrosius: [name] Treating Eknoor Novack/Extender: Melburn Hake, HOYT Weeks in Treatment: 0 Edema Assessment Assessed: [Left: No] [Right: No] [Left: Edema] [Right: :] Calf Left: Right: Point of Measurement: 26 cm From Medial Instep cm 31.5 cm Ankle Left: Right: Point of Measurement: 6 cm From Medial Instep cm 21.5 cm Vascular Assessment Claudication: Claudication Assessment [Right:None] Pulses: Dorsalis Pedis Palpable: [Right:No] Posterior Tibial Palpable: [Right:No] Extremity colors, hair growth, and conditions: Extremity Color: [Right:Hyperpigmented] Hair Growth on Extremity: [Right:No] Temperature of Extremity: [Right:Warm] Electronic Signature(s) Signed:  10/23/2018 5:16:49 PM By: Harold Barban Entered By: Harold Barban on 10/23/2018 08:53:33 [name] ([id number]) -------------------------------------------------------------------------------- Multi Wound Chart Details Patient Name: [name] Date of Service: 10/23/2018 8:45 AM Medical Record Number: [id number] Patient Account Number: [id number] Date of Birth/Sex: [birth year] (73 y.o. 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] Comorbid History: [4:Cataracts, Chronic Obstructive N/A Pulmonary Disease (COPD), Sleep Apnea, Hypotension, Myocardial Infarction, Type II Diabetes, Osteoarthritis, Neuropathy] Date Acquired: [4:10/18/2018] [N/A:N/A] Weeks of Treatment: [4:0] [N/A:N/A] Wound Status: [4:Open] [N/A:N/A] Measurements L x W x D [4:3.5x2.5x0.1] [N/A:N/A] (cm) Area (cm) : [4:6.872] [N/A:N/A] Volume (cm) : [4:0.687] [N/A:N/A] Classification: [4:Grade 1] [N/A:N/A] Exudate Amount: [4:None Present] [N/A:N/A] Wound Margin: [4:Indistinct, nonvisible] [N/A:N/A] Granulation Amount: [4:None Present (0%)] [N/A:N/A] Necrotic Amount: [4:Large (67-100%)] [N/A:N/A] Necrotic Tissue: [4:Eschar] [N/A:N/A] Exposed Structures: [4:Fascia: No Fat Layer (Subcutaneous Tissue) Exposed: No Tendon: No Muscle: No Joint: No Bone: No] [N/A:N/A] Epithelialization: [4:None] [N/A:N/A] Periwound Skin Texture: [4:Callus: Yes Excoriation: No Induration:  No Crepitus: No] [N/A:N/A] Rash: No Scarring: No Periwound Skin Moisture: Dry/Scaly: Yes N/A N/A Maceration: No Periwound Skin Color: Hemosiderin Staining: Yes N/A N/A Atrophie Blanche: No Cyanosis: No Ecchymosis: No Erythema: No Mottled: No Pallor: No Rubor: No Tenderness on Palpation: No N/A N/A Wound Preparation: Ulcer Cleansing: N/A N/A Rinsed/Irrigated with Saline Topical Anesthetic Applied: Other: lidocaine 4% Treatment Notes Electronic Signature(s) Signed: 10/23/2018 4:47:13 PM By: [name] Entered By: [name] on 10/23/2018 09:16:08 [name] ([id number]) -------------------------------------------------------------------------------- [place] Details Patient Name: [name] Date of Service: 10/23/2018 8:45 AM Medical Record Number: [id number] Patient Account Number: [id number] Date of Birth/Sex: [birth year] (73 y.o. M) Treating RN: [name] Primary Care Lurae Hornbrook: [name], Delfino Lovett Other Clinician: Referring Maricel Swartzendruber: [name] Treating Brittnie Lewey/Extender: Sharalyn Ink in Treatment: 0 Active Inactive Electronic Signature(s) Signed: 11/12/2018 11:24:01 AM By: Gretta Cool, BSN, RN, CWS, Kim RN, BSN Signed: 12/03/2018 10:45:06 AM By: [name] Previous Signature: 10/23/2018 4:47:13 PM Version By: [name] Entered By: Gretta Cool BSN, RN, CWS, Kim on 11/12/2018 11:24:01 JAHMAI, FINELLI ([id number]) -------------------------------------------------------------------------------- Pain Assessment Details Patient Name: [name] Date of Service: 10/23/2018 8:45 AM Medical Record Number: [id number] Patient Account Number: [id number] Date of Birth/Sex: Aug 07, 1946 (73 y.o. M) Treating RN: Harold Barban Primary Care Seira Cody: [name], Delfino Lovett Other Clinician: Referring Lennox Leikam: [name] Treating Ariell Gunnels/Extender: Melburn Hake, HOYT Weeks in Treatment: 0 Active  Problems Location of Pain Severity and Description of Pain Patient Has Paino Yes Site Locations Pain Location: Pain in Ulcers With Dressing Change: Yes Duration of the Pain. Constant / Intermittento Intermittent Rate the pain. Current Pain Level: 5 Character of Pain Describe the Pain: Aching, Sharp, Throbbing Pain Management and Medication Current Pain Management: Electronic Signature(s) Signed: 10/23/2018 5:16:49 PM By: Harold Barban Entered By: Harold Barban on 10/23/2018 08:41:06 [name] ([id number]) -------------------------------------------------------------------------------- Patient/Caregiver Education Details Patient Name: [name] Date of Service: 10/23/2018 8:45 AM Medical Record Number: [id number] Patient Account Number: [id number] Date of Birth/Gender: 12-24-1945 (73 y.o. M) Treating RN: [name] Primary Care Physician: [name], Delfino Lovett Other Clinician: Referring Physician: [name] Treating Physician/Extender: Sharalyn Ink in Treatment: 0 Education Assessment Education Provided To: Patient Education Topics Provided Wound/Skin Impairment: Handouts: Other: wound care as ordered and reportable s/s Methods: Demonstration, Explain/Verbal Responses: State content correctly Electronic Signature(s) Signed: 10/23/2018 4:47:13 PM By: [name] Entered By: [name] on 10/23/2018 09:25:37 [name] ([id number]) -------------------------------------------------------------------------------- Wound Assessment Details Patient Name: [name] Date of Service: 10/23/2018 8:45 AM Medical Record Number: [id number] Patient Account Number: [id number] Date of Birth/Sex: 1946/05/19 (73 y.o. M) Treating RN: Harold Barban Primary Care Raynell Upton: [name] Other Clinician: Referring  Kwane Rohl: [name], Delfino Lovett Treating Swetha Rayle/Extender: Melburn Hake, HOYT Weeks in Treatment: 0 Wound  Status Wound Number: 4 Primary Diabetic Wound/Ulcer of the Lower Extremity Etiology: Wound Location: Right Amputation Site - Below Knee - Distal Wound Open Status: Wounding Event: Shear/Friction Comorbid Cataracts, Chronic Obstructive Pulmonary Date Acquired: 10/18/2018 History: Disease (COPD), Sleep Apnea, Hypotension, Weeks Of Treatment: 0 Myocardial Infarction, Type II Diabetes, Clustered Wound: No Osteoarthritis, Neuropathy Photos Photo Uploaded By: Harold Barban on 10/23/2018 11:20:48 Wound Measurements Length: (cm) 3.5 Width: (cm) 2.5 Depth: (cm) 0.1 Area: (cm) 6.872 Volume: (cm) 0.687 % Reduction in Area: % Reduction in Volume: Epithelialization: None Tunneling: No Undermining: No Wound Description Classification: Grade 1 Wound Margin: Indistinct, nonvisible Exudate Amount: None Present Foul Odor After Cleansing: No Slough/Fibrino Yes Wound Bed Granulation Amount: None Present (0%) Exposed Structure Necrotic Amount: Large (67-100%) Fascia Exposed: No Necrotic Quality: Eschar Fat Layer (Subcutaneous Tissue) Exposed: No Tendon Exposed: No Muscle Exposed: No Joint Exposed: No Bone Exposed: No Periwound Skin Texture Texture Color CONNERY, SHIFFLER. ([id number]) No Abnormalities Noted: No No Abnormalities Noted: No Callus: Yes Atrophie Blanche: No Crepitus: No Cyanosis: No Excoriation: No Ecchymosis: No Induration: No Erythema: No Rash: No Hemosiderin Staining: Yes Scarring: No Mottled: No Pallor: No Moisture Rubor: No No Abnormalities Noted: No Dry / Scaly: Yes Maceration: No Wound Preparation Ulcer Cleansing: Rinsed/Irrigated with Saline Topical Anesthetic Applied: Other: lidocaine 4%, Electronic Signature(s) Signed: 10/23/2018 5:16:49 PM By: Harold Barban Entered By: Harold Barban on 10/23/2018 08:52:18 [name] ([id number]) -------------------------------------------------------------------------------- Vitals  Details Patient Name: [name] Date of Service: 10/23/2018 8:45 AM Medical Record Number: [id number] Patient Account Number: [id number] Date of Birth/Sex: [birth year] (73 y.o. M) Treating RN: Harold Barban Primary Care Tayton Decaire: [name], Delfino Lovett Other Clinician: Referring Paizleigh Wilds: [name] Treating Chelan Heringer/Extender: Melburn Hake, HOYT Weeks in Treatment: 0 Vital Signs Time Taken: 08:40 Temperature (F): 97.6 Height (in): 70 Pulse (bpm): 74 Source: Stated Respiratory Rate (breaths/min): 18 Weight (lbs): 246 Blood Pressure (mmHg): 167/73 Source: Measured Reference Range: 80 - 120 mg / dl Body Mass Index (BMI): 35.3 Electronic Signature(s) Signed: 10/23/2018 5:16:49 PM By: Harold Barban Entered By: Harold Barban on 10/23/2018 08:41:53

## 2018-10-25 NOTE — Progress Notes (Signed)
Andrew, Lyons ([id number]) Visit Report for 10/23/2018 Chief Complaint Document Details Patient Name: Andrew Lyons, Andrew Lyons Date of Service: 10/23/2018 8:45 AM Medical Record Number: [id number] Patient Account Number: [id number] Date of Birth/Sex: [birth year] (73 y.o. M) Treating RN: [name] Primary Care Provider: [name], Delfino Lovett Other Clinician: Referring Provider: [name] Treating Provider/Extender: Melburn Hake, Esabella Stockinger Weeks in Treatment: 0 Information Obtained from: Patient Chief Complaint Review of a nonhealing wound on his right leg below-knee amputation site in roughly the same place as last time Electronic Signature(s) Signed: 10/25/2018 1:01:02 AM By: Worthy Keeler PA-C Entered By: Worthy Keeler on 10/23/2018 08:59:11 Andrew Lyons ([id number]) -------------------------------------------------------------------------------- HPI Details Patient Name: Andrew Lyons Date of Service: 10/23/2018 8:45 AM Medical Record Number: [id number] Patient Account Number: [id number] Date of Birth/Sex: 06-26-1946 (72 y.o. M) Treating RN: [name] Primary Care Provider: [name], Delfino Lovett Other Clinician: Referring Provider: [name] Treating Provider/Extender: Worthy Keeler Weeks in Treatment: 0 History of Present Illness HPI Description: 01/04/17; this is a 73 year old diabetic man who has a remote history of a traumatic lower extremity damage at age 72 requiring an amputation. He tells me spent 2 years walking on crutches then ultimately has been walking on prosthesis without any trouble since then. Several months ago he had a new prosthesis and developed an open area in the right stump in December. He saw podiatry Dr. Prudence Davidson who noted that this was really out of his practice jurisdiction and referred him here. He has been using mupirocin. He has continued to walk on the prosthesis. The prosthesis itself as been adjusted by the  prostatitis. He does not have a known arterial issue. He tells me he probably has diabetic neuropathy. He had a wound that took a long time to heal surrounding the actual amputation itself however is not had more recent wounds. 01/10/17 Culture from last week grew pseudomonas. change AB to cefdinir yesterday from doxy. he is not using his prosthesis which I emphasized although he is having it adjusted next week 01/17/17; he is completing his antibiotics in the next day or 2. We've been using silver alginate. His prosthesis is away being readjusted. He had an x-ray of the underlying bone here apparently at his podiatrist's office although I'll need to have a look at that and we can't find the result then he may need this re-x-rayed. 01/24/17; he has completed his antibiotics. Change to Silver collagen last week. No real change in the wound this week. 01/31/17 repeat culture I did last week showed a few Pseudomonas. This only has intermediate sensitivity to quinolones I therefore put him back on a week of Cefdinir. X-ray of the stump did not show any bone destruction. We are attempting to get vascular studies done through Dr.Arida's office on May 23. 02/07/17; arterial studies are booked for May 23 no major change. He has completed his antibiotics 02/14/17 arterial studies on Thursday 5/23. using collagen 02/21/17; the patient had his arterial studies. Both his ABIs were good at 1.0 on the right and 1.1 on the left. His abdominal and iliac arteries revealed atherosclerosis without focal stenosis duplex imaging of the right lower leg revealed heterogeneous plaque and biphasic Doppler waveforms from the right CFA to the popliteal artery. The right distal SFA and tibial peroneal trunk velocities were elevated. 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 The proximal right ATA and PTA were patent above the amputation level. We have been using Silver collagen with some improvement  in the  wound dimensions 02/28/17; we went ahead and did TCOM measurements on this patient. One of the leads was nonfunctional however on the right leg both the proximal and periwound leads registered greater than 70 mmHg oxygen tension. Interestingly the comparison lead on the left leg/relative wound level was in the mid 40s. This allays any fears I had about contributing wound ischemia. In the meantime is measurements today were 0.2 x 0.4 x 0.7 the wound is now a small divot most of it appears to be epithelialized with only a small open area at that depth. There is no evidence of surrounding infection 03/07/17; continued improvement down to 0.3 cm in depth. This is a very tiny circular opening. No drainage no tenderness. Looks like it is been progressing towards closure. He has been using Silver collagen although I think this is too small to use this as a primary dressing. 03/14/17; still 0.3 cm in depth tiny circular wound. No drainage or tenderness. Used Iodosorb ointment starting last week 03/21/17; still the depth and this wound. Using a #3 curet I remove necrotic material from the wound base. There is a divot here. Not making enough improvement on the Iodosorb, I change back to silver collagen this week. 03/28/17; depth down to 0.2. I cannot actually see a surface of this wound. We've been using silver collagen which I will continue 04/04/17 on evaluation today patient appears to be doing very well. In fact his wound appears to be completely healed though there is a slight eschar over the area I see no fluctuance underneath and I am reluctant to attempt scraping on the area due to the very center superficial nature of the eschar. Patient has no discomfort. 04/11/17; the patient went back to [facility] to his prosthetist however his leg was too swollen on the right deficient into the prosthesis. The wound itself on the tip of his stump on the right is closed and was close last week. I see no reason to  be concerned about this. [name], [name] ([id number]) READMISSION 10/25/17; this is a patient that we had in clinic from April to July 2018 with a probing tiny open area on his right below sided below-knee amputation site. X-ray of this area did not show osteomyelitis. I had some concern about this being ischemic for a while although we did ABIs and T Coms that did a lot to allay these concerns. The patient is a type II diabetic although I don't have a recent hemoglobin A1c. He tells me that after he left the clinic last time he got a new prosthesis, not the prosthesis that originally caused wounds in this area. He states about a month ago used noted some dark discoloration in his skin or at least his wife did when he was getting out of the shower. He since has noted increasing pain and tenderness when he walks around this area. He arrives in clinic with no open wound 11/01/17; small probing wound on the medial aspect of the stump from a distal amputation site remotely. Surprisingly culture I did last week showed gram-positive cocci but did not culture. X-ray showed edema but no bony abnormality no soft tissue gas etc. we've been using silver alginate. He is offloading this using a scooter 11/08/17; small probing wound on the medial aspect of his stump from a distal amputation done remotely. He arrived in here with a small probing hold the did not go to bone. Culture I did of the discharged from here was  negative. Nevertheless I gave him a week's worth of Augmentin wondering whether this could've been anaerobic. He is not having any pain versus when he came in here. He does have callus on the medial aspect of the stump and in my mind this represents excess pressure on this area when he walks. The area has completely closed once again READMISSION 05/16/18 This is the third admission to the clinic for this patient who had a very distal below-knee amputation many years ago. She uses a modified  prosthesis. We have had problems with recurrent wounds in the medial part of the stump which is a small area originally his distal tibia. We've been able to get this to close on 2 occasions. He tells me that this reopened about 2 months ago. Since then he's been "doctoring on it" himself I think packing it with collagen and covering it with alginate he had left over from previous visits. Previous plain x-rays of this area have not shown soft tissue or bone issues on the stump.he tells me he went to see vein and vascular in [place] in follow-up of this area although I don't have this in my notes I'll need to research this in Epic. By his description he says that the blood flow on the right was better than the left. He is a poorly controlled diabetic with a recent hemoglobin A1c of 9 last month he has not yet on insulin. On 02/17/17 he had arterial studies. These were generally quite good with an ABI on the right of 1 on the left of 1.1. Duplex imaging with color Doppler of the right lower leg showed heterogeneous plaque and biphasic Doppler waveforms from the right SFA to the popliteal artery. Right distal SFA and tibial peroneal trunk velocities were elevated. He was felt to have a 30- 49% distal SFA stenosis however the right ATA and PTA were felt to be patent above the amputation level. I do not have that he actually saw a vascular surgeon I'll need to research this in Epic E May have actually been referring to the test just quoted 05/23/18; x-ray I did last week was negative for osteomyelitis. Culture the wound that probed to bone last time was negative [but not a bone culture]. We've been using silver alginate packing. This is now the third occasion that this wound has opened in precisely the same area with precisely the same appearance. The patient and his wife are somewhat frustrated by this. They have made all modifications to his prosthetic to offload this area. Nevertheless he develops  callus with nonviable tissue underneath and a reopening of this wound. I really do not believe that he has underlying osteomyelitis although at this point I am going to have to rule that out. I've ordered an unenhanced CT scan of the bone underneath his distal amputation. He also has some degree of PAD based on arterial studies done on 02/16/17. At that point duplex imaging suggested that the abdominal aorta and iliac vessels showed atherosclerosis without stenosis. There was heterogeneous plaque with biphasic Doppler waveforms in the right CFA to the popliteal artery right distal left SFA and tibial peroneal trunk velocities were elevated. I did a TCOM at that time that showed good results. We did get the wound to heal at that time. The wound remained closed for about 6 months only to reopen recently. 05/30/18; the patient has his consult with Dr. Fletcher Anon on 06/04/18. his CT scan of the bone on the amputation site is apparently booked  for 06/14/18. We are going to look over all of this to make sure there isn't a contributing factor of the recurrent breakdown in this area other than the prosthesis and the resultant pressure. This is the third recurrence of the wound and the precisely the same area. On presentation this goes down to bone. We've been using silver alginate packing 06/06/18; the patient is going for an angiogram on September 25 by Dr. Fletcher Anon. He has a CT scan on September 19. Using silver alginate. Depth of the wound is down to 0.5 cm 06/13/18; angiogram on September 25 and CT scan of the lower leg tomorrow. He is using silver alginate depth of the wound measured at 0.8 cm today. This is after aggressive debridement 06/27/18; at angiography the patient was found to have severe calcified distal SFA stenosis and significant popliteal artery stenosis. He underwent arthrectomy and drug-coated balloon angioplasty of the right SFA as well as a drug-coated balloon angioplasty in the right popliteal  artery. He was placed on dual anyplace platelet therapy. 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 BILAAL, LEIB ([id number]) He arrives today in clinic with the area completely closed. It was on his way to closing as usual with the pressure offloading but I have no doubt that the reperfusion is helped. He sees Dr. Fletcher Anon tomorrow 10/23/18 on evaluation today patient presents for initial inspection in our clinic concerning issues that he is having with a recurring pressure injury to the distal aspect of his invitation of the right lower extremity. He states that this seems to keep happening over and over and he has had his prosthesis reworked three times. Nonetheless he states that once this time heals he doesn't think he's going to use the prosthesis anymore as this is becoming increasingly frustrating and also to a degree somewhat dangerous from the standpoint of additional amputation. With that being said he really does not have an open wound at this point yet although he does have an obvious deep tissue injury based on what I'm seeing at this point. There is no sign of infection currently. Electronic Signature(s) Signed: 10/25/2018 1:01:02 AM By: Worthy Keeler PA-C Entered By: Worthy Keeler on 10/25/2018 00:18:11 Andrew Lyons ([id number]) -------------------------------------------------------------------------------- Physical Exam Details Patient Name: Andrew Lyons Date of Service: 10/23/2018 8:45 AM Medical Record Number: [id number] Patient Account Number: [id number] Date of Birth/Sex: [birth year] (72 y.o. M) Treating RN: [name] Primary Care Provider: [name], Delfino Lovett Other Clinician: Referring Provider: [name] Treating Provider/Extender: Melburn Hake, Rad Gramling Weeks in Treatment: 0 Constitutional patient is hypertensive.. pulse regular and within target range for patient.Marland Kitchen  respirations regular, non-labored and within target range for patient.Marland Kitchen temperature within target range for patient.. Well-nourished and well-hydrated in no acute distress. Eyes conjunctiva clear no eyelid edema noted. pupils equal round and reactive to light and accommodation. Ears, Nose, Mouth, and Throat no gross abnormality of ear auricles or external auditory canals. normal hearing noted during conversation. mucus membranes moist. Respiratory normal breathing without difficulty. clear to auscultation bilaterally. Cardiovascular regular rate and rhythm with normal S1, S2. no clubbing, cyanosis, significant edema, <3 sec cap refill. Gastrointestinal (GI) soft, non-tender, non-distended, +BS. no ventral hernia noted. Musculoskeletal Patient unable to walk without assistance. Psychiatric this patient is able to make decisions and demonstrates good insight into disease process. Alert and Oriented x 3. pleasant and cooperative. Notes Patient does have a deep tissue injury to the  distal aspect of his invitation at the ankle of the right lower extremity. There is some perceived fluctuance noted but no evidence of active infection or specific abscess I believe this is simply deep tissue injury. However it is very likely in the open. Electronic Signature(s) Signed: 10/25/2018 1:01:02 AM By: Worthy Keeler PA-C Entered By: Worthy Keeler on 10/25/2018 00:19:16 Andrew Lyons ([id number]) -------------------------------------------------------------------------------- Physician Orders Details Patient Name: Andrew Lyons Date of Service: 10/23/2018 8:45 AM Medical Record Number: [id number] Patient Account Number: [id number] Date of Birth/Sex: 1945-11-08 (72 y.o. 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 o Cleanse wound with mild soap and water Skin Barriers/Peri-Wound Care Wound #4 Right,Distal Amputation Site - Below Knee o Skin Prep Primary Wound Dressing Wound #4 Right,Distal Amputation Site - Below Knee o Silver Alginate - if wound starts draining Secondary Dressing Wound #4 Right,Distal Amputation Site - Below Knee o Boardered Foam Dressing Dressing Change Frequency Wound #4 Right,Distal Amputation Site - Below Knee o Change dressing every day. Follow-up Appointments Wound #4 Right,Distal Amputation Site - Below Knee o Return Appointment in 1 week. Off-Loading Wound #4 Right,Distal Amputation Site - Below Knee o Other: - no pressure to this area Electronic Signature(s) Signed: 10/23/2018 4:47:13 PM By: [name] Signed: 10/25/2018 1:01:02 AM By: Lavonna Monarch, [place] ([id number]) Entered By: [name] on 10/23/2018 09:22:48 VICENTE, WEIDLER ([id number]) -------------------------------------------------------------------------------- Problem List Details Patient Name: Andrew Lyons Date of Service: 10/23/2018 8:45 AM Medical Record Number: [id number] Patient Account Number: [id number] Date of Birth/Sex: 1945-11-01 (73 y.o. 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 M) Treating RN: [name] Primary Care Provider: [name], Delfino Lovett Other Clinician: Referring Provider: [name] Treating Provider/Extender: Melburn Hake, Tinesha Siegrist Weeks in Treatment: 0 Subjective Chief Complaint Information obtained from Patient Review of a nonhealing wound on his right leg below-knee amputation site in roughly the same place as last time History of Present Illness (HPI) 01/04/17; this is a 73 year old diabetic man who has a remote history of a traumatic lower extremity damage at age 37 requiring an amputation. He  tells me spent 2 years walking on crutches then ultimately has been walking on prosthesis without any trouble since then. Several months ago he had a new prosthesis and developed an open area in the right stump in December. He saw podiatry Dr. Prudence Davidson who noted that this was really out of his practice jurisdiction and referred him here. He has been using mupirocin. He has continued to walk on the prosthesis. The prosthesis itself as been  adjusted by the prostatitis. He does not have a known arterial issue. He tells me he probably has diabetic neuropathy. He had a wound that took a long time to heal surrounding the actual amputation itself however is not had more recent wounds. 01/10/17 Culture from last week grew pseudomonas. change AB to cefdinir yesterday from doxy. he is not using his prosthesis which I emphasized although he is having it adjusted next week 01/17/17; he is completing his antibiotics in the next day or 2. We've been using silver alginate. His prosthesis is away being readjusted. He had an x-ray of the underlying bone here apparently at his podiatrist's office although I'll need to have a look at that and we can't find the result then he may need this re-x-rayed. 01/24/17; he has completed his antibiotics. Change to Silver collagen last week. No real change in the wound this week. 01/31/17 repeat culture I did last week showed a few Pseudomonas. This only has intermediate sensitivity to quinolones I therefore put him back on a week of Cefdinir. X-ray of the stump did not show any bone destruction. We are attempting to get vascular studies done through Dr.Arida's office on May 23. 02/07/17; arterial studies are booked for May 23 no major change. He has completed his antibiotics 02/14/17 arterial studies on Thursday 5/23. using collagen 02/21/17; the patient had his arterial studies. Both his ABIs were good at 1.0 on the right and 1.1 on the left. His abdominal and iliac arteries revealed atherosclerosis without focal stenosis duplex imaging of the right lower leg revealed heterogeneous plaque and biphasic Doppler waveforms from the right CFA to the popliteal artery. The right distal SFA and tibial peroneal trunk velocities were elevated. 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 The proximal right ATA and PTA were patent above the amputation level. We have been using Silver collagen with some improvement in  the wound dimensions 02/28/17; we went ahead and did TCOM measurements on this patient. One of the leads was nonfunctional however on the right leg both the proximal and periwound leads registered greater than 70 mmHg oxygen tension. Interestingly the comparison lead on the left leg/relative wound level was in the mid 40s. This allays any fears I had about contributing wound ischemia. In the meantime is measurements today were 0.2 x 0.4 x 0.7 the wound is now a small divot most of it appears to be epithelialized with only a small open area at that depth. There is no evidence of surrounding infection 03/07/17; continued improvement down to 0.3 cm in depth. This is a very tiny circular opening. No drainage no tenderness. Looks like it is been progressing towards closure. He has been using Silver collagen although I think this is too small to use this as a primary dressing. 03/14/17; still 0.3 cm in depth tiny circular wound. No drainage or tenderness. Used Iodosorb ointment starting last week 03/21/17; still the depth and this wound. Using a #3 curet I remove necrotic material from the wound base. There is a divot here. Not  making enough improvement on the Iodosorb, I change back to silver collagen this week. 03/28/17; depth down to 0.2. I cannot actually see a surface of this wound. We've been using silver collagen which I will continue DAVEYON, KITCHINGS ([id number]) 04/04/17 on evaluation today patient appears to be doing very well. In fact his wound appears to be completely healed though there is a slight eschar over the area I see no fluctuance underneath and I am reluctant to attempt scraping on the area due to the very center superficial nature of the eschar. Patient has no discomfort. 04/11/17; the patient went back to [facility] to his prosthetist however his leg was too swollen on the right deficient into the prosthesis. The wound itself on the tip of his stump on the right is closed and was  close last week. I see no reason to be concerned about this. READMISSION 10/25/17; this is a patient that we had in clinic from April to July 2018 with a probing tiny open area on his right below sided below-knee amputation site. X-ray of this area did not show osteomyelitis. I had some concern about this being ischemic for a while although we did ABIs and T Coms that did a lot to allay these concerns. The patient is a type II diabetic although I don't have a recent hemoglobin A1c. He tells me that after he left the clinic last time he got a new prosthesis, not the prosthesis that originally caused wounds in this area. He states about a month ago used noted some dark discoloration in his skin or at least his wife did when he was getting out of the shower. He since has noted increasing pain and tenderness when he walks around this area. He arrives in clinic with no open wound 11/01/17; small probing wound on the medial aspect of the stump from a distal amputation site remotely. Surprisingly culture I did last week showed gram-positive cocci but did not culture. X-ray showed edema but no bony abnormality no soft tissue gas etc. we've been using silver alginate. He is offloading this using a scooter 11/08/17; small probing wound on the medial aspect of his stump from a distal amputation done remotely. He arrived in here with a small probing hold the did not go to bone. Culture I did of the discharged from here was negative. Nevertheless I gave him a week's worth of Augmentin wondering whether this could've been anaerobic. He is not having any pain versus when he came in here. He does have callus on the medial aspect of the stump and in my mind this represents excess pressure on this area when he walks. The area has completely closed once again READMISSION 05/16/18 This is the third admission to the clinic for this patient who had a very distal below-knee amputation many years ago. She uses a modified  prosthesis. We have had problems with recurrent wounds in the medial part of the stump which is a small area originally his distal tibia. We've been able to get this to close on 2 occasions. He tells me that this reopened about 2 months ago. Since then he's been "doctoring on it" himself I think packing it with collagen and covering it with alginate he had left over from previous visits. Previous plain x-rays of this area have not shown soft tissue or bone issues on the stump.he tells me he went to see vein and vascular in English in follow-up of this area although I don't have this in my  notes I'll need to research this in Epic. By his description he says that the blood flow on the right was better than the left. He is a poorly controlled diabetic with a recent hemoglobin A1c of 9 last month he has not yet on insulin. On 02/17/17 he had arterial studies. These were generally quite good with an ABI on the right of 1 on the left of 1.1. Duplex imaging with color Doppler of the right lower leg showed heterogeneous plaque and biphasic Doppler waveforms from the right SFA to the popliteal artery. Right distal SFA and tibial peroneal trunk velocities were elevated. He was felt to have a 30- 49% distal SFA stenosis however the right ATA and PTA were felt to be patent above the amputation level. I do not have that he actually saw a vascular surgeon I'll need to research this in Epic E May have actually been referring to the test just quoted 05/23/18; x-ray I did last week was negative for osteomyelitis. Culture the wound that probed to bone last time was negative [but not a bone culture]. We've been using silver alginate packing. This is now the third occasion that this wound has opened in precisely the same area with precisely the same appearance. The patient and his wife are somewhat frustrated by this. They have made all modifications to his prosthetic to offload this area. Nevertheless he develops  callus with nonviable tissue underneath and a reopening of this wound. I really do not believe that he has underlying osteomyelitis although at this point I am going to have to rule that out. I've ordered an unenhanced CT scan of the bone underneath his distal amputation. He also has some degree of PAD based on arterial studies done on 02/16/17. At that point duplex imaging suggested that the abdominal aorta and iliac vessels showed atherosclerosis without stenosis. There was heterogeneous plaque with biphasic Doppler waveforms in the right CFA to the popliteal artery right distal left SFA and tibial peroneal trunk velocities were elevated. I did a TCOM at that time that showed good results. We did get the wound to heal at that time. The wound remained closed for about 6 months only to reopen recently. 05/30/18; the patient has his consult with Dr. Fletcher Anon on 06/04/18. his CT scan of the bone on the amputation site is apparently booked for 06/14/18. We are going to look over all of this to make sure there isn't a contributing factor of the recurrent breakdown in this area other than the prosthesis and the resultant pressure. This is the third recurrence of the wound and the precisely the same area. On presentation this goes down to bone. We've been using silver alginate packing 06/06/18; the patient is going for an angiogram on September 25 by Dr. Fletcher Anon. He has a CT scan on September 19. Using silver alginate. Depth of the wound is down to 0.5 cm MAYRA, BRAHM ([id number]) 06/13/18; angiogram on September 25 and CT scan of the lower leg tomorrow. He is using silver alginate depth of the wound measured at 0.8 cm today. This is after aggressive debridement 06/27/18; at angiography the patient was found to have severe calcified distal SFA stenosis and significant popliteal artery stenosis. He underwent arthrectomy and drug-coated balloon angioplasty of the right SFA as well as a drug-coated  balloon angioplasty in the right popliteal artery. He was placed on dual anyplace platelet therapy. 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 He arrives today in clinic with the area completely closed. It was on his way to closing as usual with the pressure offloading but I have no doubt that the reperfusion is helped. He sees Dr. Fletcher Anon tomorrow 10/23/18 on evaluation today patient presents for initial inspection in our clinic concerning issues that he is having with a recurring pressure injury to the distal aspect of his invitation of the right lower extremity. He states that this seems to keep happening over and over and he has had his prosthesis reworked three times. Nonetheless he states that once this time heals he doesn't think he's going to use the prosthesis anymore as this is becoming increasingly frustrating and also to a degree somewhat dangerous from the standpoint of additional amputation. With that being said he really does not have an open wound at this point yet although he does have an obvious deep tissue injury based on what I'm seeing at this point. There is no sign of infection currently. Patient History Information obtained from Patient. Allergies Zyban, Zocor Family History Diabetes - Father, Heart Disease - Mother,Father, Hypertension - Father,Mother, No family history of Cancer, Hereditary Spherocytosis, Kidney Disease, Lung Disease, Seizures, Stroke, Thyroid Problems, Tuberculosis. Social History Former smoker - quit 15 yrs ago, Marital Status - Married, Alcohol Use - Rarely, Drug Use - No History, Caffeine Use - Daily. Medical History Eyes Patient has history of Cataracts Respiratory Patient has history of Chronic Obstructive Pulmonary Disease (COPD), Sleep Apnea Cardiovascular Patient has history of Hypotension, Myocardial Infarction - 2006 Endocrine Patient has history of Type II  Diabetes Musculoskeletal Patient has history of Osteoarthritis Neurologic Patient has history of Neuropathy Objective MARSDEN, ZAINO ([id number]) Constitutional patient is hypertensive.. pulse regular and within target range for patient.Marland Kitchen respirations regular, non-labored and within target range for patient.Marland Kitchen temperature within target range for patient.. Well-nourished and well-hydrated in no acute distress. Vitals Time Taken: 8:40 AM, Height: 70 in, Source: Stated, Weight: 246 lbs, Source: Measured, BMI: 35.3, Temperature: 97.6 F, Pulse: 74 bpm, Respiratory Rate: 18 breaths/min, Blood Pressure: 167/73 mmHg. Eyes conjunctiva clear no eyelid edema noted. pupils equal round and reactive to light and accommodation. Ears, Nose, Mouth, and Throat no gross abnormality of ear auricles or external auditory canals. normal hearing noted during conversation. mucus membranes moist. Respiratory normal breathing without difficulty. clear to auscultation bilaterally. Cardiovascular regular rate and rhythm with normal S1, S2. no clubbing, cyanosis, significant edema, Gastrointestinal (GI) soft, non-tender, non-distended, +BS. no ventral hernia noted. Musculoskeletal Patient unable to walk without assistance. Psychiatric this patient is able to make decisions and demonstrates good insight into disease process. Alert and Oriented x 3. pleasant and cooperative. General Notes: Patient does have a deep tissue injury to the distal aspect of his invitation at the ankle of the right lower extremity. There is some perceived fluctuance noted but no evidence of active infection or specific abscess I believe this is simply deep tissue injury. However it is very likely in the open. Integumentary (Hair, Skin) Wound #4 status is Open. Original cause of wound was Shear/Friction. The wound is located on the Right,Distal Amputation Site - Below Knee. The wound measures 3.5cm length x 2.5cm width x 0.1cm depth;  6.872cm^2 area and 0.687cm^3 volume. There is no tunneling or undermining noted. There is a none present amount of drainage noted. The wound margin is indistinct and nonvisible. There is no granulation within the wound bed. There is a large (67-100%) amount of necrotic tissue within the wound bed including  Eschar. The periwound skin appearance exhibited: Callus, Dry/Scaly, Hemosiderin Staining. The periwound skin appearance did not exhibit: Crepitus, Excoriation, Induration, Rash, Scarring, Maceration, Atrophie Blanche, Cyanosis, Ecchymosis, Mottled, Pallor, Rubor, Erythema. 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 Cleanse wound with mild soap and water Skin Barriers/Peri-Wound Care: Wound #4 Right,Distal Amputation Site - Below Knee: Skin Prep Primary Wound Dressing: Wound #4 Right,Distal Amputation Site - Below Knee: Silver Alginate - if wound starts draining Secondary Dressing: Wound #4 Right,Distal Amputation Site - Below Knee: Boardered Foam Dressing Dressing Change Frequency: Wound #4 Right,Distal Amputation Site - Below Knee: Change dressing every day. Follow-up Appointments: Wound #4 Right,Distal Amputation Site - Below Knee: Return Appointment in 1 week. Off-Loading: Wound #4 Right,Distal Amputation Site - Below Knee: Other: - no pressure to this area My suggestion this point is gonna be that we go ahead and initiate the above wound care measures for the next week. Hopefully this area will not open and will be able to manage it is a deep tissue injury  which can still take time to improve. With that being said I have a strong suspicion that this is going to likely open to an actual wound. He states that it has every time before. We will see how things proceed. Please see above for specific wound care orders. We will see patient for re-evaluation in 1 week(s) here in the clinic. If anything worsens or changes patient will contact our office for additional recommendations. Electronic Signature(s) Signed: 10/25/2018 1:01:02 AM By: Worthy Keeler PA-C Entered By: Worthy Keeler on 10/25/2018 00:19:27 Andrew Lyons ([id number]) -------------------------------------------------------------------------------- ROS/PFSH Details Patient Name: Andrew Lyons Date of Service: 10/23/2018 8:45 AM Medical Record Number: [id number] Patient Account Number: [id number] Date of Birth/Sex: 1945-12-21 (72 y.o. M) Treating RN: Harold Barban Primary Care Provider: [name], Delfino Lovett Other Clinician: Referring Provider: [name] Treating Provider/Extender: Melburn Hake, Hye Trawick Weeks in Treatment: 0 Information Obtained From Patient Wound History Eyes Medical History: Positive for: Cataracts Respiratory Medical History: Positive for: Chronic Obstructive Pulmonary Disease (COPD); Sleep Apnea Cardiovascular Medical History: Positive for: Hypotension; Myocardial Infarction - 2006 Endocrine Medical History: Positive for: Type II Diabetes Time with diabetes: 6 yrs Treated with: Insulin, Oral agents Blood sugar tested every day: No Musculoskeletal Medical History: Positive for: Osteoarthritis Neurologic Medical History: Positive for: Neuropathy HBO Extended History Items Eyes: Cataracts Immunizations Pneumococcal Vaccine: Received Pneumococcal Vaccination: Yes Implantable Devices Family and Social History CHILTON, SALLADE ([id number]) Cancer: No; Diabetes: Yes - Father; Heart Disease: Yes - Mother,Father; Hereditary  Spherocytosis: No; Hypertension: Yes - Father,Mother; Kidney Disease: No; Lung Disease: No; Seizures: No; Stroke: No; Thyroid Problems: No; Tuberculosis: No; Former smoker - quit 15 yrs ago; Marital Status - Married; Alcohol Use: Rarely; Drug Use: No History; Caffeine Use: Daily; Financial Concerns: No; Food, Clothing or Shelter Needs: No; Support System Lacking: No; Transportation Concerns: No; Advanced Directives: No; Patient does not want information on Advanced Directives; Do not resuscitate: No; Living Will: No; Medical Power of Attorney: No Electronic Signature(s) Signed: 10/23/2018 5:16:49 PM By: Harold Barban Signed: 10/25/2018 1:01:02 AM By: Worthy Keeler PA-C Entered By: Harold Barban on 10/23/2018 08:43:25  CHUKWUMA, STRAUS ([id number]) -------------------------------------------------------------------------------- SuperBill Details Patient Name: Andrew Lyons Date of Service: 10/23/2018 Medical Record Number: [id number] Patient Account Number: [id number] Date of Birth/Sex: 03-07-1946 (73 y.o. 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 E11.51 Type 2 diabetes mellitus with diabetic peripheral angiopathy without gangrene Facility Procedures CPT4 Code: 52481859 Description: 99213 - WOUND CARE VISIT-LEV 3 EST PT Modifier: Quantity: 1 Physician Procedures CPT4: Description Modifier Quantity Code [id number] 99214 - WC PHYS LEVEL 4 - EST PT 1 ICD-10 Diagnosis Description E11.622 Type 2 diabetes mellitus with other skin ulcer S88.111D  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

## 2018-10-25 NOTE — Progress Notes (Signed)
Family Meeting Note  Advance Directive no  Today a meeting took place with the pt and wife  Patient came in with right amputation stump significant pain discoloration and cellulitis with possible infection. He has significant amount of pain. Found to have gangrene in that area. Patient is being admitted for IV antibiotics and further evaluation. Code status discussed patient is code. Time spent during discussion:16 mins Fritzi Mandes, MD
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
## 2018-10-25 NOTE — Progress Notes (Signed)
Pharmacy Antibiotic Note  Andrew Lyons is a 73 y.o. male admitted on 10/25/2018 with Osteomyelitis.  Pharmacy has been consulted for vanc/zosyn dosing.  Plan: Patient received vanc 2g IV load in ED  Vancomycin 1500 mg IV Q 24 hrs. Goal AUC 400-550. Expected AUC: 481.9 SCr used: 1.14 mg/dL  Will continue zosyn 3.375g IV q8h   Ke 0.055 (per IBW) CrCl (per IBW): 61 ml/min T1/2 12.5 hrs Css trough: 10.4 mcg/mL  Temp (24hrs), Avg:98 F (36.7 C), Min:97.7 F (36.5 C), Max:98.2 F (36.8 C)  Recent Labs  Lab 10/24/18 2334 10/24/18 2335  WBC 8.1  --   CREATININE 1.14  --   LATICACIDVEN  --  1.2    Estimated Creatinine Clearance: 74.6 mL/min (by C-G formula based on SCr of 1.14 mg/dL).    Allergies  Allergen Reactions  . Iodinated Diagnostic Agents Rash    had a red chest -unusre of what kind of dye it was  . Zyban [Bupropion] Rash  . Zocor [Simvastatin] Other (See Comments)    Reaction: hepatitis   . Gadolinium Derivatives Rash    Thank you for allowing pharmacy to be a part of this patient's care.  Tobie Lords, PharmD, BCPS Clinical Pharmacist 10/25/2018

## 2018-10-25 NOTE — H&P (Signed)
[facility] History & Physical Update  The patient was interviewed and re-examined.  The patient's previous History and Physical has been reviewed and is unchanged.  There is no change in the plan of care. We plan to proceed with the scheduled procedure.  Leotis Pain, MD  10/25/2018, 1:02 PM

## 2018-10-25 NOTE — Telephone Encounter (Signed)
I have called and spoke to [place].  She said that the pain got so bad he went to the hospital and they have admitted him with diagnosis of gangrene.

## 2018-10-25 NOTE — Op Note (Signed)
[facility]  Percutaneous Study/Intervention Procedural Note   Date of Surgery: 10/25/2018  Surgeon(s):DEW,JASON    Assistants:none  Pre-operative Diagnosis: PAD with ulceration RLE  Post-operative diagnosis:  Same  Procedure(s) Performed:             1.  Ultrasound guidance for vascular access left femoral artery             2.  Catheter placement into right common femoral artery from left femoral approach             3.  Aortogram and selective right lower extremity angiogram             4.  Percutaneous transluminal angioplasty of the right anterior tibial artery with 2.5 mm diameter by 30 cm length angioplasty balloon             5.   Percutaneous transluminal angioplasty of the right popliteal artery in the mid to distal superficial femoral artery with a 5 mm diameter by 22 cm length and a 5 mm diameter by 10 cm length Lutonix drug-coated angioplasty balloon  6.  StarClose closure device left femoral artery  EBL: 10 cc  Contrast: 60 cc  Fluoro Time: 3.4 minutes  Moderate Conscious Sedation Time: approximately 35 minutes using 4 mg of Versed and 100 Mcg of Fentanyl              Indications:  Patient is a 73 y.o.male with nonhealing ulcerations and pain on his right foot amputation site. The patient has not had any noninvasive studies and was admitted to the hospital with said problem.  The angiogram would be diagnostic as well as potentially therapeutic depending on the findings.  He does have a previous history of PAD. The patient is brought in for angiography for further evaluation and potential treatment.  Due to the limb threatening nature of the situation, angiogram was performed for attempted limb salvage. The patient is aware that if the procedure fails, amputation would be expected.  The patient also understands that even with successful revascularization, amputation may still be required due to the severity of the situation. Risks and benefits are  discussed and informed consent is obtained.   Procedure:  The patient was identified and appropriate procedural time out was performed.  The patient was then placed supine on the table and prepped and draped in the usual sterile fashion. Moderate conscious sedation was administered during a face to face encounter with the patient throughout the procedure with my supervision of the RN administering medicines and monitoring the patient's vital signs, pulse oximetry, telemetry and mental status throughout from the start of the procedure until the patient was taken to the recovery room. Ultrasound was used to evaluate the left common femoral artery.  It was patent .  A digital ultrasound image was acquired.  A Seldinger needle was used to access the left common femoral artery under direct ultrasound guidance and a permanent image was performed.  A 0.035 J wire was advanced without resistance and a 5Fr sheath was placed.  Pigtail catheter was placed into the aorta and an AP aortogram was performed. This demonstrated that the renal arteries appeared to have no hemodynamically significant stenosis.  The aorta was ectatic to mildly aneurysmal but not stenotic.  The iliac arteries were calcific but not stenotic. I then crossed the aortic bifurcation and advanced to the right femoral head. Selective right lower extremity angiogram was then performed. This demonstrated mild disease of the  common femoral artery and proximal superficial femoral artery of less than 50%.  In the mid to distal superficial femoral artery and above-knee popliteal artery there is diffuse calcific disease creating 60 to 80% stenosis.  The vessel then normalized across the knee and in the below-knee popliteal artery there was a high-grade stenosis in the 80 to 90% range just above the takeoff of the anterior tibial artery.  The anterior tibial artery had moderate stenosis proximally and high-grade stenosis of greater than 90% in the midsegment.  The  peroneal artery was continuous.  The proximal posterior tibial artery appeared to be patent, but this was not seen distally.  It was difficult to discern if this was because it was behind hardware or occluded. It was felt that it was in the patient's best interest to proceed with intervention after these images to avoid a second procedure and a larger amount of contrast and fluoroscopy based off of the findings from the initial angiogram. The patient was systemically heparinized and a 6 [place] Ansell sheath was then placed over the [employer] wire. I then used a Kumpe catheter and the advantage wire to navigate down through the SFA and popliteal disease and into the anterior tibial artery.  I then remove the advantage wire and exchanged for a 0.018 wire that I parked at the ankle.  Angioplasty was then performed throughout the proximal and mid anterior tibial artery encompassing all of the lesions.  A 2.5 mm diameter by 30 cm length angioplasty balloon was inflated to 12 atm for 1 minute.  Completion imaging showed less than 30% residual stenosis in the anterior tibial artery.  I then used a 5 mm diameter by 22 cm length Lutonix drug-coated angioplasty balloon and started this in the below-knee popliteal artery to take care of the lesion in the below-knee popliteal artery and extended up through the above-knee popliteal artery up to Hunter's canal to treat the lesion in the above-knee popliteal artery.  An additional 5 mm diameter by 10 cm length Lutonix drug-coated angioplasty balloon was used to treat the distal SFA up to the mid SFA to treat the entirety of the lesions.  Both inflations were 8 to 10 atm for 1 minute.  Completion imaging showed marked improvement with less than 20% residual stenosis in the SFA and less than 20% residual stenosis in the popliteal artery. I elected to terminate the procedure. The sheath was removed and StarClose closure device was deployed in the left femoral artery with  excellent hemostatic result. The patient was taken to the recovery room in stable condition having tolerated the procedure well.  Findings:               Aortogram:  The renal arteries appeared to have no hemodynamically significant stenosis.  The aorta was ectatic to mildly aneurysmal but not stenotic.  The iliac arteries were calcific but not stenotic.             Right Lower Extremity:  This demonstrated mild disease of the common femoral artery and proximal superficial femoral artery of less than 50%.  In the mid to distal superficial femoral artery and above-knee popliteal artery there is diffuse calcific disease creating 60 to 80% stenosis.  The vessel then normalized across the knee and in the below-knee popliteal artery there was a high-grade stenosis in the 80 to 90% range just above the takeoff of the anterior tibial artery.  The anterior tibial artery had moderate stenosis proximally and high-grade stenosis of  greater than 90% in the midsegment.  The peroneal artery was continuous.  The proximal posterior tibial artery appeared to be patent, but this was not seen distally.  It was difficult to discern if this was because it was behind hardware or occluded   Disposition: Patient was taken to the recovery room in stable condition having tolerated the procedure well.  Complications: None  Leotis Pain 10/25/2018 2:00 PM   This note was created with Dragon Medical transcription system. Any errors in dictation are purely unintentional.
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
## 2018-10-25 NOTE — ED Notes (Signed)
Pt with pain on right side where prior amputation occurred. Pt has spot on lower part of leg where stump is.

## 2018-10-26 ENCOUNTER — Encounter: Payer: Self-pay | Admitting: Vascular Surgery

## 2018-10-26 DIAGNOSIS — Z9862 Peripheral vascular angioplasty status: Secondary | ICD-10-CM

## 2018-10-26 LAB — GLUCOSE, CAPILLARY
GLUCOSE-CAPILLARY: 198 mg/dL — AB (ref 70–99)
Glucose-Capillary: 233 mg/dL — ABNORMAL HIGH (ref 70–99)
Glucose-Capillary: 215 mg/dL — ABNORMAL HIGH (ref 70–99)
Glucose-Capillary: 235 mg/dL — ABNORMAL HIGH (ref 70–99)

## 2018-10-26 MED ORDER — KETOROLAC TROMETHAMINE 15 MG/ML IJ SOLN: 15.0000 mg | Freq: Once | INTRAMUSCULAR | Status: AC | PRN

## 2018-10-26 MED ORDER — AMOXICILLIN-POT CLAVULANATE 875-125 MG PO TABS
1.0000 | ORAL_TABLET | Freq: Two times a day (BID) | ORAL | Status: DC
  Administered 2018-10-26 – 2018-10-27 (×2): 1 via ORAL
  Filled 2018-10-26 (×2): qty 1

## 2018-10-26 MED ORDER — INSULIN GLARGINE 100 UNIT/ML ~~LOC~~ SOLN
10.0000 [IU] | Freq: Every day | SUBCUTANEOUS | Status: DC
  Administered 2018-10-26 – 2018-10-29 (×4): 10 [IU] via SUBCUTANEOUS
  Filled 2018-10-26 (×5): qty 0.1

## 2018-10-26 MED ORDER — OXYCODONE HCL 5 MG PO TABS
10.0000 mg | ORAL_TABLET | ORAL | Status: DC | PRN
  Administered 2018-10-26 – 2018-10-29 (×8): 10 mg via ORAL
  Filled 2018-10-26 (×7): qty 2

## 2018-10-26 NOTE — Progress Notes (Signed)
Doing ADL; IV pulled out by accident.

## 2018-10-26 NOTE — Progress Notes (Signed)
[facility] Daily Progress Note   Subjective: 1 Day Post-Op: 1. Ultrasound guidance for vascular access left femoral artery 2. Catheter placement into right common femoral artery from left femoral approach 3. Aortogram and selective right lower extremity angiogram 4. Percutaneous transluminal angioplasty of the right anterior tibial artery with 2.5 mm diameter by 30 cm length angioplasty balloon 5.  Percutaneous transluminal angioplasty of the right popliteal artery in the mid to distal superficial femoral artery with a 5 mm diameter by 22 cm length and a 5 mm diameter by 10 cm length Lutonix drug-coated angioplasty balloon 6. StarClose closure device left femoral artery  Pain is improved when compared to before yesterdays procedure however is still somewhat significant today.   Objective: Vitals:   10/25/18 1537 10/25/18 2303 10/26/18 0746 10/26/18 1007  BP: (!) 115/58 (!) 122/58 112/72 135/62  Pulse: 92 67 66   Resp:  17 17   Temp: 98.5 F (36.9 C) 98.6 F (37 C) 97.6 F (36.4 C)   TempSrc: Oral Oral Oral   SpO2: 94% 96% 97%   Weight:      Height:        Intake/Output Summary (Last 24 hours) at 10/26/2018 1337 Last data filed at 10/26/2018 1004 Gross per 24 hour  Intake 2048.98 ml  Output 0 ml  Net 2048.98 ml   Physical Exam: A&Ox3, NAD CV: RRR Pulmonary: CTA Bilaterally Abdomen: Soft, Nontender, Nondistended Left Groin: Access site clean, dry and intact Vascular:  Right Lower Extremity: Thigh soft, calf soft. Stump is warm and pink. Wound stable.    Laboratory: CBC    Component Value Date/Time   WBC 8.1 10/24/2018 2334   HGB 14.5 10/24/2018 2334   HGB 14.8 08/22/2018 0756   HCT 42.3 10/24/2018 2334   HCT 42.2 08/22/2018 0756   PLT 216 10/24/2018 2334   PLT 197 08/22/2018 0756   BMET    Component Value Date/Time   NA 139 10/24/2018 2334   NA 140 08/22/2018 0756   K 3.6 10/24/2018 2334   CL 105 10/24/2018 2334   CO2 24  10/24/2018 2334   GLUCOSE 173 (H) 10/24/2018 2334   BUN 24 (H) 10/24/2018 2334   BUN 18 08/22/2018 0756   CREATININE 1.14 10/24/2018 2334   CALCIUM 9.0 10/24/2018 2334   GFRNONAA >60 10/24/2018 2334   GFRAA >60 10/24/2018 2334   Assessment/Planning: The patient is a 73 year old male with a past medical history of PAD with RLE ischemia and wound - POD #1 endovascular intervention 1) Improvement in physical exam today. Stump is warm. 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  Discussed with Dr. Ellis Parents Andrew Trostel PA-C 10/26/2018 1:37 PM
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
## 2018-10-26 NOTE — Progress Notes (Signed)
Pharmacy Antibiotic Note  Andrew Lyons is a 73 y.o. male admitted on 10/25/2018 with Osteomyelitis.  Pharmacy has been consulted for vanc/zosyn dosing.  Plan: Patient received vanc 2g IV load in ED  Vancomycin 1500 mg IV Q 24 hrs. Goal AUC 400-550. Expected AUC: 481.9 SCr used: 1.14 mg/dL  Will continue zosyn 3.375g IV q8h   Ke 0.055 (per IBW) CrCl (per IBW): 61 ml/min T1/2 12.5 hrs Css trough: 10.4 mcg/mL  Temp (24hrs), Avg:98.4 F (36.9 C), Min:97.6 F (36.4 C), Max:98.9 F (37.2 C)  Recent Labs  Lab 10/24/18 2334 10/24/18 2335 10/25/18 0334  WBC 8.1  --  8.4  CREATININE 1.14  --  1.13  LATICACIDVEN  --  1.2  --     Estimated Creatinine Clearance: 74.8 mL/min (by C-G formula based on SCr of 1.13 mg/dL).    Allergies  Allergen Reactions  . Iodinated Diagnostic Agents Rash    had a red chest -unusre of what kind of dye it was  . Zyban [Bupropion] Rash  . Zocor [Simvastatin] Other (See Comments)    Reaction: hepatitis   . Gadolinium Derivatives Rash    Thank you for allowing pharmacy to be a part of this patient's care.  Evelena Asa, PharmD Clinical Pharmacist 10/26/2018

## 2018-10-26 NOTE — Evaluation (Signed)
Occupational Therapy Evaluation Patient Details Name: Andrew Lyons MRN: [id number] DOB: 17-Jan-1946 Today's Date: 10/26/2018    History of Present Illness 73 year old male with RLE ischemia and wound, now POD #1 RLE angioplasty. 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   Clinical Impression   Pt seen for OT evaluation this date. Pt modified independent at baseline with mobility using crutches or knee scooter, and for ADL and IADL. Pt is able to drive and picks up his grand kids from school during the week. Pt reports pain (but improving since surgery) in his RLE with improved redness noted, per pt. Pt able to perform bed mobility independently, and minimal difficulty with functional transfers. Pt very near baseline modified independence for ADL at this time. Pt endorses difficulty with donning L shoe and lower back pain when performing grooming tasks at sink. Pt instructed in routines modifications and positioning changes to improve independence and minimize pain/strain on the lower back. Pt verbalized understanding and appreciative of instruction. No additional skilled OT needs identified at this time. Will sign off. Please re-consult if additional needs arise.     Follow Up Recommendations  No OT follow up    Equipment Recommendations  None recommended by OT    Recommendations for Other Services       Precautions / Restrictions Precautions Precautions: None Restrictions Weight Bearing Restrictions: No Other Position/Activity Restrictions: No specific weight bearing precautions noted, maintained NWBing to distal RLE/stump      Mobility Bed Mobility Overal bed mobility: Independent                Transfers Overall transfer level: Needs assistance Equipment used: Rolling walker (2 wheeled) Transfers: Sit to/from Stand Sit to Stand: Supervision;From elevated surface              Balance Overall balance assessment: No apparent balance  deficits (not formally assessed)                                         ADL either performed or assessed with clinical judgement   ADL Overall ADL's : At baseline;Modified independent                                       General ADL Comments: baseline modified independence with ADL tasks     Vision Baseline Vision/History: Wears glasses Wears Glasses: Reading only;At all times Patient Visual Report: No change from baseline       Perception     Praxis      Pertinent Vitals/Pain Pain Assessment: 0-10 Pain Score: 4  Pain Location: R stump Pain Descriptors / Indicators: Aching Pain Intervention(s): Limited activity within patient's tolerance;Monitored during session;Premedicated before session;Repositioned     Hand Dominance     Extremity/Trunk Assessment Upper Extremity Assessment Upper Extremity Assessment: Overall WFL for tasks assessed   Lower Extremity Assessment Lower Extremity Assessment: RLE deficits/detail(LLE WFL) RLE Deficits / Details: hip flex 5/5, knee ext/flex 5/5, hip/knee ROM WFL RLE: Unable to fully assess due to pain   Cervical / Trunk Assessment Cervical / Trunk Assessment: Normal   Communication Communication Communication: No difficulties   Cognition Arousal/Alertness: Awake/alert Behavior During Therapy: WFL for tasks assessed/performed Overall Cognitive Status: Within Functional Limits for tasks assessed  General Comments  RLE stump with some redness still (pt reports less than before with redness below marker line), discoloration noted at distal end of stump    Exercises Other Exercises Other Exercises: pt instructed in positioning and AE to improve pt's ability to don shoe on L foot 2/2 pt reporting that this is one of the most difficult aspects of his morning routine Other Exercises: pt instructed in positioning changes and modifications to tasks while  standing at a sink for grooming tasks to minimize lumbar strain/pain with bending   Shoulder Instructions      Home Living Family/patient expects to be discharged to:: Private residence Living Arrangements: Spouse/significant other Available Help at Discharge: Family;Available 24 hours/day Type of Home: House Home Access: Stairs to enter [employer] of Steps: 5 Entrance Stairs-Rails: Left(wall on R) Home Layout: One level     Bathroom Shower/Tub: Teacher, early years/pre: Standard     Home Equipment: Crutches;Other (comment);Shower seat;Bedside commode   Additional Comments: knee scooter      Prior Functioning/Environment Level of Independence: Independent with assistive device(s)        Comments: Pt mod indep w/ mobility using knee scooter primarily, crutches for outside short community distances, no falls, mod indep with ADL, picks up grandkids from school in the afternoons and watches them for a couple hours during the week        OT Problem List: Pain      OT Treatment/Interventions:      OT Goals(Current goals can be found in the care plan section) Acute Rehab OT Goals Patient Stated Goal: to go home and get back to normal routine OT Goal Formulation: All assessment and education complete, DC therapy  OT Frequency:     Barriers to D/C:            Co-evaluation              AM-PAC OT "6 Clicks" Daily Activity     Outcome Measure Help from another person eating meals?: None Help from another person taking care of personal grooming?: None Help from another person toileting, which includes using toliet, bedpan, or urinal?: A Little Help from another person bathing (including washing, rinsing, drying)?: None Help from another person to put on and taking off regular upper body clothing?: None Help from another person to put on and taking off regular lower body clothing?: None 6 Click Score: 23   End of Session    Activity  Tolerance: Patient tolerated treatment well Patient left: in bed;with call bell/phone within reach;with bed alarm set  OT Visit Diagnosis: Other abnormalities of gait and mobility (R26.89);Pain Pain - Right/Left: Right Pain - part of body: Leg                Time: 1435-1457 OT Time Calculation (min): 22 min Charges:  OT General Charges $OT Visit: 1 Visit OT Evaluation $OT Eval Low Complexity: 1 Low OT Treatments $Self Care/Home Management : 8-22 mins  Jeni Salles, MPH, MS, OTR/L ascom [phone] 10/26/18, 3:18 PM
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
## 2018-10-27 ENCOUNTER — Inpatient Hospital Stay: Payer: PPO | Admitting: Anesthesiology

## 2018-10-27 ENCOUNTER — Encounter
Admission: EM | Disposition: A | Discharge status: Home Health | Payer: Self-pay | Source: Home / Self Care | Attending: Internal Medicine

## 2018-10-27 ENCOUNTER — Encounter: Payer: Self-pay | Admitting: Surgery

## 2018-10-27 ENCOUNTER — Inpatient Hospital Stay: Payer: PPO

## 2018-10-27 HISTORY — PX: AMPUTATION: SHX166

## 2018-10-27 HISTORY — PX: HARDWARE REMOVAL: SHX979

## 2018-10-27 LAB — CBC
HCT: 35.7 % — ABNORMAL LOW (ref 39.0–52.0)
Hemoglobin: 11.8 g/dL — ABNORMAL LOW (ref 13.0–17.0)
MCH: 30.2 pg (ref 26.0–34.0)
MCHC: 33.1 g/dL (ref 30.0–36.0)
MCV: 91.3 fL (ref 80.0–100.0)
Platelets: 203 10*3/uL (ref 150–400)
RBC: 3.91 MIL/uL — ABNORMAL LOW (ref 4.22–5.81)
RDW: 14.4 % (ref 11.5–15.5)
WBC: 9.1 10*3/uL (ref 4.0–10.5)
nRBC: 0 % (ref 0.0–0.2)

## 2018-10-27 LAB — BASIC METABOLIC PANEL
Anion gap: 7 (ref 5–15)
BUN: 28 mg/dL — AB (ref 8–23)
CO2: 21 mmol/L — ABNORMAL LOW (ref 22–32)
CREATININE: 1.08 mg/dL (ref 0.61–1.24)
Calcium: 8.3 mg/dL — ABNORMAL LOW (ref 8.9–10.3)
Chloride: 108 mmol/L (ref 98–111)
GFR calc Af Amer: >60 mL/min (ref >60)
GFR calc non Af Amer: >60 mL/min (ref >60)
Glucose, Bld: 171 mg/dL — ABNORMAL HIGH (ref 70–99)
Potassium: 3.8 mmol/L (ref 3.5–5.1)
Sodium: 136 mmol/L (ref 135–145)

## 2018-10-27 LAB — GLUCOSE, CAPILLARY
Glucose-Capillary: 149 mg/dL — ABNORMAL HIGH (ref 70–99)
Glucose-Capillary: 181 mg/dL — ABNORMAL HIGH (ref 70–99)
Glucose-Capillary: 158 mg/dL — ABNORMAL HIGH (ref 70–99)
Glucose-Capillary: 163 mg/dL — ABNORMAL HIGH (ref 70–99)

## 2018-10-27 LAB — MAGNESIUM: Magnesium: 2.4 mg/dL (ref 1.7–2.4)

## 2018-10-27 SURGERY — REMOVAL, HARDWARE
Anesthesia: General | Laterality: Right

## 2018-10-27 MED ORDER — ACETAMINOPHEN 325 MG PO TABS
325.0000 mg | ORAL_TABLET | Freq: Four times a day (QID) | ORAL | Status: DC | PRN
  Administered 2018-10-28 – 2018-10-29 (×2): 650 mg via ORAL
  Filled 2018-10-27 (×2): qty 2

## 2018-10-27 MED ORDER — GELATIN ABSORBABLE 12-7 MM EX MISC
CUTANEOUS | Status: AC
  Filled 2018-10-27: qty 1

## 2018-10-27 MED ORDER — LACTATED RINGERS IV SOLN
INTRAVENOUS | Status: DC | PRN
  Administered 2018-10-27: 17:00:00 via INTRAVENOUS

## 2018-10-27 MED ORDER — SODIUM CHLORIDE 0.9 % IV SOLN
INTRAVENOUS | Status: DC
  Administered 2018-10-27 (×2): via INTRAVENOUS

## 2018-10-27 MED ORDER — ROCURONIUM BROMIDE 100 MG/10ML IV SOLN
INTRAVENOUS | Status: DC | PRN
  Administered 2018-10-27: 5 mg via INTRAVENOUS
  Administered 2018-10-27: 25 mg via INTRAVENOUS
  Administered 2018-10-27: 20 mg via INTRAVENOUS

## 2018-10-27 MED ORDER — PHENOL 1.4 % MT LIQD
1.0000 | OROMUCOSAL | Status: DC | PRN
  Filled 2018-10-27: qty 177

## 2018-10-27 MED ORDER — FENTANYL CITRATE (PF) 100 MCG/2ML IJ SOLN
INTRAMUSCULAR | Status: AC
  Filled 2018-10-27: qty 2

## 2018-10-27 MED ORDER — ALUM & MAG HYDROXIDE-SIMETH 200-200-20 MG/5ML PO SUSP: 30.0000 mL | Freq: Four times a day (QID) | ORAL | Status: DC | PRN

## 2018-10-27 MED ORDER — VANCOMYCIN HCL 10 G IV SOLR
1500.0000 mg | INTRAVENOUS | Status: DC
  Filled 2018-10-27: qty 1500

## 2018-10-27 MED ORDER — FENTANYL CITRATE (PF) 100 MCG/2ML IJ SOLN
INTRAMUSCULAR | Status: AC
  Administered 2018-10-27: 25 ug via INTRAVENOUS
  Filled 2018-10-27: qty 2

## 2018-10-27 MED ORDER — VANCOMYCIN HCL 10 G IV SOLR: 2250.0000 mg | INTRAVENOUS | Status: DC

## 2018-10-27 MED ORDER — SUCCINYLCHOLINE CHLORIDE 20 MG/ML IJ SOLN
INTRAMUSCULAR | Status: DC | PRN
  Administered 2018-10-27: 100 mg via INTRAVENOUS

## 2018-10-27 MED ORDER — SODIUM CHLORIDE 0.9 % IV SOLN: 250.0000 mL | INTRAVENOUS | Status: DC | PRN

## 2018-10-27 MED ORDER — VANCOMYCIN HCL 10 G IV SOLR
2500.0000 mg | Freq: Once | INTRAVENOUS | Status: AC
  Administered 2018-10-27: 2500 mg via INTRAVENOUS
  Filled 2018-10-27: qty 500

## 2018-10-27 MED ORDER — DEXAMETHASONE SODIUM PHOSPHATE 10 MG/ML IJ SOLN
INTRAMUSCULAR | Status: AC
  Filled 2018-10-27: qty 1

## 2018-10-27 MED ORDER — ACETAMINOPHEN 10 MG/ML IV SOLN
INTRAVENOUS | Status: AC
  Filled 2018-10-27: qty 100

## 2018-10-27 MED ORDER — ACETAMINOPHEN 650 MG RE SUPP: 325.0000 mg | Freq: Four times a day (QID) | RECTAL | Status: DC | PRN

## 2018-10-27 MED ORDER — SODIUM CHLORIDE 0.9 % IV SOLN
INTRAVENOUS | Status: DC | PRN
  Administered 2018-10-27: 580 mL

## 2018-10-27 MED ORDER — ONDANSETRON HCL 4 MG/2ML IJ SOLN
INTRAMUSCULAR | Status: DC | PRN
  Administered 2018-10-27: 4 mg via INTRAVENOUS

## 2018-10-27 MED ORDER — ONDANSETRON HCL 4 MG/2ML IJ SOLN
INTRAMUSCULAR | Status: AC
  Filled 2018-10-27: qty 2

## 2018-10-27 MED ORDER — LIDOCAINE HCL (CARDIAC) PF 100 MG/5ML IV SOSY
PREFILLED_SYRINGE | INTRAVENOUS | Status: DC | PRN
  Administered 2018-10-27: 100 mg via INTRAVENOUS

## 2018-10-27 MED ORDER — ACETAMINOPHEN 10 MG/ML IV SOLN
INTRAVENOUS | Status: DC | PRN
  Administered 2018-10-27: 1000 mg via INTRAVENOUS

## 2018-10-27 MED ORDER — SODIUM CHLORIDE 0.9% FLUSH
3.0000 mL | Freq: Two times a day (BID) | INTRAVENOUS | Status: DC
  Administered 2018-10-28 – 2018-10-29 (×2): 3 mL via INTRAVENOUS

## 2018-10-27 MED ORDER — DEXAMETHASONE SODIUM PHOSPHATE 10 MG/ML IJ SOLN
INTRAMUSCULAR | Status: DC | PRN
  Administered 2018-10-27: 5 mg via INTRAVENOUS

## 2018-10-27 MED ORDER — KETAMINE HCL 10 MG/ML IJ SOLN
INTRAMUSCULAR | Status: DC | PRN
  Administered 2018-10-27 (×2): 20 mg via INTRAVENOUS
  Administered 2018-10-27: 10 mg via INTRAVENOUS

## 2018-10-27 MED ORDER — GENTAMICIN SULFATE 40 MG/ML IJ SOLN
INTRAMUSCULAR | Status: AC
  Filled 2018-10-27: qty 2

## 2018-10-27 MED ORDER — OXYCODONE HCL 5 MG PO TABS: 5.0000 mg | ORAL_TABLET | Freq: Four times a day (QID) | ORAL | 0 refills | Status: DC | PRN

## 2018-10-27 MED ORDER — FENTANYL CITRATE (PF) 100 MCG/2ML IJ SOLN
25.0000 ug | INTRAMUSCULAR | Status: AC | PRN
  Administered 2018-10-27 (×4): 25 ug via INTRAVENOUS
  Administered 2018-10-27: 21 ug via INTRAVENOUS
  Administered 2018-10-27: 25 ug via INTRAVENOUS

## 2018-10-27 MED ORDER — PIPERACILLIN-TAZOBACTAM 3.375 G IVPB
3.3750 g | Freq: Three times a day (TID) | INTRAVENOUS | Status: DC
  Administered 2018-10-27 – 2018-10-28 (×2): 3.375 g via INTRAVENOUS
  Filled 2018-10-27 (×2): qty 50

## 2018-10-27 MED ORDER — AMOXICILLIN-POT CLAVULANATE 875-125 MG PO TABS: 1.0000 | ORAL_TABLET | Freq: Two times a day (BID) | ORAL | 0 refills | Status: DC

## 2018-10-27 MED ORDER — PROPOFOL 10 MG/ML IV BOLUS
INTRAVENOUS | Status: DC | PRN
  Administered 2018-10-27: 150 mg via INTRAVENOUS

## 2018-10-27 MED ORDER — GELATIN ABSORBABLE 100 CM EX MISC
CUTANEOUS | Status: AC
  Filled 2018-10-27: qty 1

## 2018-10-27 MED ORDER — FENTANYL CITRATE (PF) 100 MCG/2ML IJ SOLN
INTRAMUSCULAR | Status: DC | PRN
  Administered 2018-10-27 (×5): 50 ug via INTRAVENOUS

## 2018-10-27 MED ORDER — LIDOCAINE HCL (PF) 2 % IJ SOLN
INTRAMUSCULAR | Status: AC
  Filled 2018-10-27: qty 10

## 2018-10-27 MED ORDER — SODIUM CHLORIDE 0.9% FLUSH: 3.0000 mL | INTRAVENOUS | Status: DC | PRN

## 2018-10-27 MED ORDER — MAGNESIUM HYDROXIDE 400 MG/5ML PO SUSP: 30.0000 mL | Freq: Every day | ORAL | Status: DC | PRN

## 2018-10-27 MED ORDER — KETAMINE HCL 50 MG/ML IJ SOLN
INTRAMUSCULAR | Status: AC
  Filled 2018-10-27: qty 10

## 2018-10-27 MED ORDER — ROCURONIUM BROMIDE 50 MG/5ML IV SOLN
INTRAVENOUS | Status: AC
  Filled 2018-10-27: qty 1

## 2018-10-27 MED ORDER — SUGAMMADEX SODIUM 200 MG/2ML IV SOLN
INTRAVENOUS | Status: AC
  Filled 2018-10-27: qty 2

## 2018-10-27 MED ORDER — SUGAMMADEX SODIUM 200 MG/2ML IV SOLN
INTRAVENOUS | Status: DC | PRN
  Administered 2018-10-27: 200 mg via INTRAVENOUS

## 2018-10-27 MED ORDER — GUAIFENESIN-DM 100-10 MG/5ML PO SYRP: 15.0000 mL | ORAL_SOLUTION | ORAL | Status: DC | PRN

## 2018-10-27 MED ORDER — SUCCINYLCHOLINE CHLORIDE 20 MG/ML IJ SOLN
INTRAMUSCULAR | Status: AC
  Filled 2018-10-27: qty 1

## 2018-10-27 MED ORDER — ONDANSETRON HCL 4 MG/2ML IJ SOLN: 4.0000 mg | Freq: Once | INTRAMUSCULAR | Status: DC | PRN

## 2018-10-27 SURGICAL SUPPLY — 72 items
BANDAGE ACE 6X5 VEL STRL LF (GAUZE/BANDAGES/DRESSINGS) ×3 IMPLANT
BANDAGE ELASTIC 6 LF NS (GAUZE/BANDAGES/DRESSINGS) ×3 IMPLANT
BLADE SAGITTAL WIDE XTHICK NO (BLADE) IMPLANT
BLADE SAW SAG 25.4X90 (BLADE) ×3 IMPLANT
BNDG COHESIVE 4X5 TAN STRL (GAUZE/BANDAGES/DRESSINGS) ×3 IMPLANT
BNDG GAUZE 4.5X4.1 6PLY STRL (MISCELLANEOUS) ×9 IMPLANT
BRUSH SCRUB EZ  4% CHG (MISCELLANEOUS) ×2
BRUSH SCRUB EZ 4% CHG (MISCELLANEOUS) ×1 IMPLANT
CANISTER SUCT 1200ML W/VALVE (MISCELLANEOUS) ×3 IMPLANT
CHLORAPREP W/TINT 26ML (MISCELLANEOUS) ×3 IMPLANT
COOLER POLAR GLACIER W/PUMP (MISCELLANEOUS) IMPLANT
COVER WAND RF STERILE (DRAPES) ×3 IMPLANT
CUFF TOURN 24 STER (MISCELLANEOUS) IMPLANT
CUFF TOURN 30 STER DUAL PORT (MISCELLANEOUS) IMPLANT
DRAIN PENROSE 1/4X12 LTX (DRAIN) ×3 IMPLANT
DRAPE C-ARM XRAY 36X54 (DRAPES) IMPLANT
DRAPE C-ARMOR (DRAPES) IMPLANT
DRAPE INCISE IOBAN 66X45 STRL (DRAPES) IMPLANT
DRAPE SHEET LG 3/4 BI-LAMINATE (DRAPES) ×3 IMPLANT
DRSG TELFA 3X8 NADH (GAUZE/BANDAGES/DRESSINGS) ×6 IMPLANT
DURAPREP 26ML APPLICATOR (WOUND CARE) IMPLANT
ELECT CAUTERY BLADE 6.4 (BLADE) ×3 IMPLANT
ELECT REM PT RETURN 9FT ADLT (ELECTROSURGICAL) ×3
ELECTRODE REM PT RTRN 9FT ADLT (ELECTROSURGICAL) ×1 IMPLANT
GAUZE PETRO XEROFOAM 1X8 (MISCELLANEOUS) IMPLANT
GAUZE PETROLATUM 1 X8 (GAUZE/BANDAGES/DRESSINGS) ×3 IMPLANT
GAUZE SPONGE 4X4 12PLY STRL (GAUZE/BANDAGES/DRESSINGS) ×3 IMPLANT
GLOVE BIO SURGEON STRL SZ7 (GLOVE) ×6 IMPLANT
GLOVE BIOGEL PI IND STRL 8 (GLOVE) ×1 IMPLANT
GLOVE BIOGEL PI INDICATOR 8 (GLOVE) ×2
GLOVE INDICATOR 7.5 STRL GRN (GLOVE) ×3 IMPLANT
GLOVE SURG SYN 7.5  E (GLOVE) ×12
GLOVE SURG SYN 7.5 E (GLOVE) ×6 IMPLANT
GOWN STRL REUS W/ TWL LRG LVL3 (GOWN DISPOSABLE) ×2 IMPLANT
GOWN STRL REUS W/ TWL XL LVL3 (GOWN DISPOSABLE) ×1 IMPLANT
GOWN STRL REUS W/TWL LRG LVL3 (GOWN DISPOSABLE) ×4
GOWN STRL REUS W/TWL XL LVL3 (GOWN DISPOSABLE) ×2
HANDLE YANKAUER SUCT BULB TIP (MISCELLANEOUS) ×3 IMPLANT
Harmonic Wave ×3 IMPLANT
IMMBOLIZER KNEE 19 BLUE UNIV (SOFTGOODS) ×3 IMPLANT
KIT TURNOVER KIT A (KITS) ×3 IMPLANT
LABEL OR SOLS (LABEL) IMPLANT
MAT ABSORB  FLUID 56X50 GRAY (MISCELLANEOUS) ×2
MAT ABSORB FLUID 56X50 GRAY (MISCELLANEOUS) ×1 IMPLANT
NEEDLE FILTER BLUNT 18X 1/2SAF (NEEDLE) ×2
NEEDLE FILTER BLUNT 18X1 1/2 (NEEDLE) ×1 IMPLANT
NS IRRIG 1000ML POUR BTL (IV SOLUTION) ×3 IMPLANT
PACK EXTREMITY ARMC (MISCELLANEOUS) ×3 IMPLANT
PACK TOTAL KNEE (MISCELLANEOUS) ×3 IMPLANT
PAD ABD DERMACEA PRESS 5X9 (GAUZE/BANDAGES/DRESSINGS) IMPLANT
PAD PREP 24X41 OB/GYN DISP (PERSONAL CARE ITEMS) ×3 IMPLANT
PAD WRAPON POLAR KNEE (MISCELLANEOUS) IMPLANT
SHEARS HARMONIC STRL 23CM (MISCELLANEOUS) ×3 IMPLANT
SPONGE LAP 18X18 RF (DISPOSABLE) ×3 IMPLANT
STAPLER SKIN PROX 35W (STAPLE) IMPLANT
STOCKINETTE M/LG 89821 (MISCELLANEOUS) ×3 IMPLANT
SUT PROLENE 2-0 (SUTURE) ×18
SUT PROLENE 2-0 TS 14X2 ARM (SUTURE) ×9
SUT SILK 2 0 (SUTURE) ×2
SUT SILK 2 0 SH (SUTURE) ×6 IMPLANT
SUT SILK 2-0 18XBRD TIE 12 (SUTURE) ×1 IMPLANT
SUT SILK 3 0 (SUTURE) ×2
SUT SILK 3-0 18XBRD TIE 12 (SUTURE) ×1 IMPLANT
SUT VIC AB 0 CT1 36 (SUTURE) ×6 IMPLANT
SUT VIC AB 2-0 CT1 (SUTURE) ×6 IMPLANT
SUT VIC AB 2-0 CT1 27 (SUTURE) ×12
SUT VIC AB 2-0 CT1 TAPERPNT 27 (SUTURE) ×6 IMPLANT
SUTURE PROLEN 2-0 TS 14X2 ARM (SUTURE) ×9 IMPLANT
SWAB DUAL CULTURE TRANS RED ST (MISCELLANEOUS) ×3 IMPLANT
SYR 5ML LL (SYRINGE) IMPLANT
TRAY FOLEY MTR SLVR 16FR STAT (SET/KITS/TRAYS/PACK) IMPLANT
WRAPON POLAR PAD KNEE (MISCELLANEOUS)

## 2018-10-27 NOTE — Anesthesia Preprocedure Evaluation (Signed)
Anesthesia Evaluation  Patient identified by MRN, date of birth, ID band Patient awake    Reviewed: Allergy & Precautions, NPO status , Patient's Chart, lab work & pertinent test results  History of Anesthesia Complications Negative for: history of anesthetic complications  Airway Mallampati: III  TM Distance: >3 FB Neck ROM: Full    Dental  (+) Edentulous Upper, Edentulous Lower   Pulmonary neg shortness of breath, sleep apnea , COPD,  COPD inhaler, neg recent URI, former smoker,    breath sounds clear to auscultation- rhonchi (-) wheezing      Cardiovascular hypertension, + CAD, + Past MI and + Cardiac Stents  (-) CABG + Valvular Problems/Murmurs (s/p AVR)  Rhythm:Regular Rate:Normal - Systolic murmurs and - Diastolic murmurs    Neuro/Psych neg Seizures negative psych ROS   GI/Hepatic Neg liver ROS, GERD  ,  Endo/Other  diabetes, Oral Hypoglycemic Agents  Renal/GU negative Renal ROS     Musculoskeletal  (+) Arthritis ,   Abdominal (+) + obese,   Peds  Hematology negative hematology ROS (+)   Anesthesia Other Findings Past Medical History: No date: 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 No date: Barrett's esophagus with dysplasia No date: COPD (chronic obstructive pulmonary disease) (HCC) No date: Coronary artery disease No date: Diabetes mellitus without complication (HCC) No date: GERD (gastroesophageal reflux disease) No date: Hepatitis No date: Hyperlipidemia No date: Hypertension No date: Myocardial infarction ([place]) No date: Seizures (HCC) No date: Sleep apnea   Reproductive/Obstetrics negative OB ROS                             Anesthesia Physical  Anesthesia Plan  ASA: III  Anesthesia Plan: General   Post-op Pain Management:    Induction: Intravenous  PONV Risk Score and Plan: 1 and Ondansetron, Midazolam and Treatment may vary due to age or medical  condition  Airway Management Planned: Oral ETT  Additional Equipment:   Intra-op Plan:   Post-operative Plan: Extubation in OR  Informed Consent: I have reviewed the patients History and Physical, chart, labs and discussed the procedure including the risks, benefits and alternatives for the proposed anesthesia with the patient or authorized representative who has indicated his/her understanding and acceptance.     Dental advisory given  Plan Discussed with: CRNA and Anesthesiologist  Anesthesia Plan Comments:         Anesthesia Quick Evaluation

## 2018-10-27 NOTE — Anesthesia Procedure Notes (Signed)
Procedure Name: Intubation Date/Time: 10/27/2018 5:05 PM Performed by: Sherrine Maples, CRNA Pre-anesthesia Checklist: Patient identified, Patient being monitored, Timeout performed, Emergency Drugs available and Suction available Patient Re-evaluated:Patient Re-evaluated prior to induction Oxygen Delivery Method: Circle system utilized Preoxygenation: Pre-oxygenation with 100% oxygen Induction Type: IV induction Ventilation: Mask ventilation without difficulty, Two handed mask ventilation required and Oral airway inserted - appropriate to patient size Laryngoscope Size: Miller and 2 Grade View: Grade II Tube type: Oral Tube size: 7.5 mm Number of attempts: 1 Airway Equipment and Method: Stylet Placement Confirmation: ETT inserted through vocal cords under direct vision,  positive ETCO2 and breath sounds checked- equal and bilateral Secured at: 21 cm Tube secured with: Tape Dental Injury: Teeth and Oropharynx as per pre-operative assessment

## 2018-10-27 NOTE — Transfer of Care (Signed)
Immediate Anesthesia Transfer of Care Note  Patient: Andrew Lyons  Procedure(s) Performed: HARDWARE REMOVAL (Right ) AMPUTATION BELOW KNEE (Right )  Patient Location: PACU  Anesthesia Type:General  Level of Consciousness: awake, alert  and oriented  Airway & Oxygen Therapy: Patient Spontanous Breathing and Patient connected to nasal cannula oxygen  Post-op Assessment: Report given to RN and Post -op Vital signs reviewed and stable  Post vital signs: Reviewed and stable  Last Vitals:  Vitals Value Taken Time  BP    Temp    Pulse    Resp    SpO2      Last Pain:  Vitals:   10/27/18 1528  TempSrc: Oral  PainSc:       Patients Stated Pain Goal: 1 (00/86/76 1950)  Complications: No apparent anesthesia complications
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
## 2018-10-27 NOTE — Procedures (Signed)
Indication for procedure: Patient with chronic peripheral arterial occlusive disease, type 2 diabetes, recurrent nonhealing right ankle stump infections, progression of gangrene and cellulitis.  Preoperative diagnosis: Right distal ankle stump gangrene and cellulitis. Operative diagnosis: Same Surgeon: Dr. Feliberto Gottron First Assistant: Dr. Leim Fabry Anesthesia: General Name of the procedure: Right below the knee amputation with extraction of foreign body.   Description of procedure: This is a 73 year old male patient with ongoing gangrene, cellulitis, pseudomonal and multi-organism infection of the right ankle stump, and life threatening infection.  All the risks and benefits of the surgical intervention were discussed with the patient and his family.  The patient elected to undergo the surgical intervention.  The appropriate timeouts were performed using both preprocedural and pre-incisional safety checklist to verify correct patient, procedure, site, and additional critical information prior to beginning the procedure.  The procedure was performed under general anesthesia.  The patient was placed in a supine position.  The patient's right lower extremity was prepped and draped in the usual sterile fashion.  An occlusive dressing was applied to the foot up to the level of the ankle along with Ioban drape.  Anterior and posterior skin incisions were outlined with a marking pen.  The anterior skin incision was made approximately 12 cm below the tibial tuberosity and extended medially and laterally towards the edges of the gastro numerous muscle.  The skin incision was then extended distally on either side parallel to the tibia for approximately 12 to 15 cm, creating a posterior flap.  The skin and subcutaneous tissues were incised down to the fascia.  The greater shorter saphenous veins on the medial and posterior aspects of the leg, respectively, were ligated and divided with the harmonic scalpel.  The  fascia and the muscles were then divided with the harmonic scalpel at the same level of the anterior skin incision.  The muscles in the anterior and lateral compartments were divided with the harmonic scalpel, exposing the anterior tibial vessels, which were ligated and divided with the harmonic scalpel.  The interosseous membrane was then incised.  The tibial periosteum was incised circumferentially with the electrocautery at the same level of the skin and muscle division.  Using a periosteal elevator, the tibial periosteum was stripped proximally 2 cm.  The tibia was then transected with an electric saw 2 cm proximal to the skin incision with an anterior bevel.  A metal rod was exposed within the intramedullary canal where then Dr. Posey Pronto took over to transect the metal artifact with a diamond saw.  Cultures were taken from the bone marrow around the metal artifact and sent to microbiology.  This was completely transected the fibula was then exposed, dissected circumferentially, and transected with a bone cutter 2 cm proximal to the tibial division.  The amputation was then completed with an amputation knife, transecting the soleal muscles obliquely and the gastro numerous muscles at the same level as the posterior flap.  Bleeding soleal veins and posterior tibial veins and peroneal vessels were a lot clamped and oversewn with 2-0 silk sutures.  Sharp bony edges were then filed, laminating any bony prominences over the anterior aspect of the tibia.  The tissues were copiously irrigated with gentamicin solution.  The fascia of the anterior and posterior muscle flaps were approximated with interrupted absorbable 2-0 Vicryl sutures.  The skin was approximated with interrupted 2-0 Prolene sutures in a vertical mattress fashion.  Betadine soaked quarter-inch Telfa strips were packed between the sutures followed by application of a  Vaseline gauze over the incision and then fluffed Curlex, ABD dressings, additional Kerlix  and Covan.  The patient was placed in a knee immobilizer.  A briefing checklist was completed to share information critical to the postoperative care of the patient.  The patient tolerated the procedure well and was taken to the post anesthetic care unit in stable condition.  Complications: None Specimen: The amputated stump gangrenous wound was cultured and sent to microbiology.  Right below the knee ankle stump/ leg were sent to pathology. Mated blood loss: 250 mL's.

## 2018-10-27 NOTE — Progress Notes (Addendum)
Pharmacy Antibiotic Note  Andrew Lyons is a 73 y.o. male admitted on 10/25/2018 with Right distal leg necrosis of right amputation stump.This AM, cellulitis has progressed. Will require further amputation to eliminate source. Patient had angiography performed 1/30. Pharmacy has been consulted for vancomycin/zosyn dosing.   Plan: Patient was on Vancomycin, which was discontinued on 1/31 Ordered LD Vancomycin 2500 mg IV x 1.  Vancomycin 1500 mg IV Q 24 hrs. Goal AUC 400-550. Expected AUC: 464 SCr used: 1.08 mg/dL Used IBW for calculation and Vd of 0.5 as BMI > 35  Re-started zosyn 3.375g IV q8h    Temp (24hrs), Avg:97.8 F (36.6 C), Min:97.5 F (36.4 C), Max:98 F (36.7 C)  Recent Labs  Lab 10/24/18 2334 10/24/18 2335 10/27/18 0455  WBC 8.1  --  9.1  CREATININE 1.14  --  1.08  LATICACIDVEN  --  1.2  --     Estimated Creatinine Clearance: 78.3 mL/min (by C-G formula based on SCr of 1.08 mg/dL).    Allergies  Allergen Reactions  . Iodinated Diagnostic Agents Rash    had a red chest -unusre of what kind of dye it was  . Zyban [Bupropion] Rash  . Zocor [Simvastatin] Other (See Comments)    Reaction: hepatitis   . Gadolinium Derivatives Rash   Dose Adjustments N/A  Antibiotics 1/30 Vancomycin >> 1/31 1/30 Zosyn >> 1/31 >> 1/31 Augment >> 2/1  Microbiology none   Thank you for allowing pharmacy to be a part of this patient's care.   Paticia Stack, PharmD Pharmacy Resident  10/27/2018 1:47 PM

## 2018-10-27 NOTE — Progress Notes (Signed)
Subjective/Objective this is a 73 year old male patient seen this morning for a  right distal leg amputation necrotic eschar.  The patient was admitted with gangrene of the distal right ankle stump.  The patient woke up this morning with foul smell and drainage coming from his necrotic eschar with pulsatile bleeding .  He had recent revascularization via an endovascular approach of the right lower extremity by Dr. Lucky Cowboy.  He has had previous infections of the right distal stump.  He is ready for a below the knee amputation per his wish, since this has been going in on and off problem for a while with wounds and infections of the right distal ankle stump.   On clinical examination the patient is alert and oriented x3 and not in acute distress, he is breathing comfortably and his lungs are clear and his heart is regular, abdominal examination is unremarkable, his left lower extremity is warm to touch with good capillary refill but pulses are nonpalpable.  The right distal stump has an eschar approximately 5 to 6 mm in diameter with foul smell and erythema creeping up the medial aspect of the leg.  There is a previously marked line that demarcated the cellulitis on admission and this has gotten worse.  The distal leg is warm to touch.  The popliteal pulse is weakly palpable on the right weakly palpable on the left.  The femoral pulses are palpable bilaterally.  The patient denies any pain on palpation.  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  Scheduled Meds: . amLODipine  5 mg Oral Daily   And  . benazepril  40 mg Oral Daily  . amoxicillin-clavulanate  1 tablet Oral Q12H  . aspirin EC  81 mg Oral Daily  . cholecalciferol  1,000 Units Oral Daily  . clopidogrel  75 mg Oral Daily  . docusate sodium  100 mg Oral BID  . enoxaparin (LOVENOX) injection  40 mg Subcutaneous Q24H  . fluticasone  2 spray Each Nare Daily  . insulin aspart  0-15 Units Subcutaneous TID WC  .  insulin aspart  0-5 Units Subcutaneous QHS  . insulin glargine  10 Units Subcutaneous QHS  . metoprolol succinate  100 mg Oral Daily  . montelukast  10 mg Oral Daily  . pantoprazole  40 mg Oral Daily  . rosuvastatin  20 mg Oral Daily  . vitamin B-12  1,000 mcg Oral Daily   Continuous Infusions: PRN Meds:acetaminophen **OR** acetaminophen, morphine injection, ondansetron **OR** ondansetron (ZOFRAN) IV, ondansetron (ZOFRAN) IV, oxyCODONE, oxyCODONE  Vital signs in last 24 hours: Temp:  [97.5 F (36.4 C)-98 F (36.7 C)] 97.9 F (36.6 C) (02/01 0750) Pulse Rate:  [58-65] 65 (02/01 0750) Resp:  [18] 18 (02/01 0750) BP: (108-133)/(55-67) 112/55 (02/01 0750) SpO2:  [95 %-99 %] 96 % (02/01 0750)  Intake/Output last 3 shifts: I/O last 3 completed shifts: In: 1709.6 [P.O.:480; I.V.:728.4; IV Piggyback:501.2] Out: 0  Intake/Output this shift: No intake/output data recorded.  Problem Assessment/Plan  No new Assessment & Plan notes have been filed under this hospital service since the last note was generated. Service: Surgery The option of debridement and attempt to heal the stump was offered to the patient which she declined.  A below the knee amputation was also recommended to be the most feasible option for quicker recovery, however a previously placed rod within the tibia will make this procedure more challenging.  The patient has opted for a below the knee amputation and  orthopedic surgery has been consulted for advice in how to deal with the intramedullary rod. The patient will be consented for right below the knee amputation.  The patient and his wife understand all the risks and benefits of the procedure and wished to proceed.

## 2018-10-27 NOTE — Anesthesia Post-op Follow-up Note (Signed)
Anesthesia QCDR form completed.        

## 2018-10-27 NOTE — H&P (Signed)
  The patient was examined this morning and was noted to have progression of cellulitis of the right distal stump.  There is also foul smell and gangrene.  The patient's laboratory analysis suggested a slight metabolic acidosis indicative with an ongoing infection.  The patient will require  below the knee amputation to eliminate the source of possible sepsis.  The patient was discussed with Dr. Marcene Corning who will be assisting in the procedure.
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
## 2018-10-28 ENCOUNTER — Encounter: Payer: Self-pay | Admitting: Surgery

## 2018-10-28 LAB — GLUCOSE, CAPILLARY
Glucose-Capillary: 158 mg/dL — ABNORMAL HIGH (ref 70–99)
Glucose-Capillary: 240 mg/dL — ABNORMAL HIGH (ref 70–99)
Glucose-Capillary: 218 mg/dL — ABNORMAL HIGH (ref 70–99)
Glucose-Capillary: 182 mg/dL — ABNORMAL HIGH (ref 70–99)

## 2018-10-28 LAB — BASIC METABOLIC PANEL
Anion gap: 6 (ref 5–15)
BUN: 19 mg/dL (ref 8–23)
CO2: 25 mmol/L (ref 22–32)
Calcium: 8.2 mg/dL — ABNORMAL LOW (ref 8.9–10.3)
Chloride: 107 mmol/L (ref 98–111)
Creatinine, Ser: 0.85 mg/dL (ref 0.61–1.24)
GFR calc Af Amer: >60 mL/min (ref >60)
GFR calc non Af Amer: >60 mL/min (ref >60)
Glucose, Bld: 243 mg/dL — ABNORMAL HIGH (ref 70–99)
Potassium: 4.5 mmol/L (ref 3.5–5.1)
Sodium: 138 mmol/L (ref 135–145)

## 2018-10-28 LAB — CBC
HCT: 35.1 % — ABNORMAL LOW (ref 39.0–52.0)
HEMOGLOBIN: 11.6 g/dL — AB (ref 13.0–17.0)
MCH: 30.4 pg (ref 26.0–34.0)
MCHC: 33 g/dL (ref 30.0–36.0)
MCV: 92.1 fL (ref 80.0–100.0)
Platelets: 226 10*3/uL (ref 150–400)
RBC: 3.81 MIL/uL — ABNORMAL LOW (ref 4.22–5.81)
RDW: 13.9 % (ref 11.5–15.5)
WBC: 7.7 10*3/uL (ref 4.0–10.5)
nRBC: 0 % (ref 0.0–0.2)

## 2018-10-28 MED ORDER — AMOXICILLIN-POT CLAVULANATE 875-125 MG PO TABS
1.0000 | ORAL_TABLET | Freq: Two times a day (BID) | ORAL | Status: DC
  Administered 2018-10-28 – 2018-10-30 (×5): 1 via ORAL
  Filled 2018-10-28 (×5): qty 1

## 2018-10-28 NOTE — Progress Notes (Signed)
1 Day Post-Op   Subjective/Chief Complaint: Patient is doing well, denies any significant pain, he does state having some tightness due to the brace which is loosened at the bedside.  The patient has been afebrile through the night and his pain has been managed well.     Objective: Vital signs in last 24 hours: Temp:  [97.8 F (36.6 C)-98.6 F (37 C)] 98.1 F (36.7 C) (02/02 0807) Pulse Rate:  [66-96] 68 (02/02 0807) Resp:  [9-20] 18 (02/02 0807) BP: (107-149)/(48-73) 149/73 (02/02 0807) SpO2:  [90 %-97 %] 97 % (02/02 0807) Last BM Date: 10/26/18  Intake/Output from previous day: 02/01 0701 - 02/02 0700 In: 400 [I.V.:300; IV Piggyback:100] Out: 2950 [Urine:2700; Blood:250] Intake/Output this shift: No intake/output data recorded.  On clinical examination the patient is alert and oriented x3 and not in acute distress.  Lungs are clear bilaterally heart regular rhythm.  Left lower extremity is unremarkable.  The right lower extremity is placed snugly within a knee brace.  The knee brace is loosened and the dressings are examined.  No staining of blood is noted through the dressings and the dressings are intact.  The knee brace was reapplied in a looser manner, which the patient stated felt comfortable.  Rest of his clinical examination is unremarkable.  Lab Results:  Recent Labs    10/27/18 0455 10/28/18 0331  WBC 9.1 7.7  HGB 11.8* 11.6*  HCT 35.7* 35.1*  PLT 203 226   BMET Recent Labs    10/27/18 0455 10/28/18 0331  NA 136 138  K 3.8 4.5  CL 108 107  CO2 21* 25  GLUCOSE 171* 243*  BUN 28* 19  CREATININE 1.08 0.85  CALCIUM 8.3* 8.2*   PT/INR No results for input(s): LABPROT, INR in the last 72 hours. ABG No results for input(s): PHART, HCO3 in the last 72 hours.  Invalid input(s): PCO2, PO2  Studies/Results: Dg C-arm 1-60 Min-no Report  Result Date: 10/27/2018 Fluoroscopy was utilized by the requesting physician.  No radiographic interpretation.     Anti-infectives: Anti-infectives (From admission, onward)   Start     Dose/Rate Route Frequency Ordered Stop   10/28/18 1400  vancomycin (VANCOCIN) 2,250 mg in sodium chloride 0.9 % 500 mL IVPB  Status:  Discontinued     2,250 mg 250 mL/hr over 120 Minutes Intravenous Every 24 hours 10/27/18 1356 10/27/18 1437   10/28/18 1400  vancomycin (VANCOCIN) 1,500 mg in sodium chloride 0.9 % 500 mL IVPB     1,500 mg 250 mL/hr over 120 Minutes Intravenous Every 24 hours 10/27/18 1437     10/27/18 1755  gentamicin (GARAMYCIN) 80 mg in sodium chloride 0.9 % 500 mL irrigation  Status:  Discontinued       As needed 10/27/18 1759 10/27/18 2006   10/27/18 1400  piperacillin-tazobactam (ZOSYN) IVPB 3.375 g     3.375 g 12.5 mL/hr over 240 Minutes Intravenous Every 8 hours 10/27/18 1338     10/27/18 1345  vancomycin (VANCOCIN) 2,500 mg in sodium chloride 0.9 % 500 mL IVPB     2,500 mg 250 mL/hr over 120 Minutes Intravenous  Once 10/27/18 1338 10/27/18 1805   10/27/18 0000  amoxicillin-clavulanate (AUGMENTIN) 875-125 MG tablet     1 tablet Oral Every 12 hours 10/27/18 0815     10/26/18 1800  amoxicillin-clavulanate (AUGMENTIN) 875-125 MG per tablet 1 tablet  Status:  Discontinued     1 tablet Oral Every 12 hours 10/26/18 1352 10/27/18 1248   10/26/18  0200  vancomycin (VANCOCIN) 1,500 mg in sodium chloride 0.9 % 500 mL IVPB  Status:  Discontinued     1,500 mg 250 mL/hr over 120 Minutes Intravenous Every 24 hours 10/25/18 0346 10/26/18 1352   10/25/18 1000  piperacillin-tazobactam (ZOSYN) IVPB 3.375 g  Status:  Discontinued     3.375 g 12.5 mL/hr over 240 Minutes Intravenous Every 8 hours 10/25/18 0346 10/26/18 1352   10/25/18 0130  piperacillin-tazobactam (ZOSYN) IVPB 3.375 g     3.375 g 100 mL/hr over 30 Minutes Intravenous  Once 10/25/18 0116 10/25/18 0221   10/25/18 0130  vancomycin (VANCOCIN) IVPB 1000 mg/200 mL premix  Status:  Discontinued     1,000 mg 200 mL/hr over 60 Minutes Intravenous   Once 10/25/18 0116 10/25/18 0122   10/25/18 0130  vancomycin (VANCOCIN) 2,000 mg in sodium chloride 0.9 % 500 mL IVPB     2,000 mg 250 mL/hr over 120 Minutes Intravenous  Once 10/25/18 0122 10/25/18 0419      Assessment/Plan: s/p Procedure(s): HARDWARE REMOVAL (Right) AMPUTATION BELOW KNEE (Right)  LOS: 3 days   Patient will remove the brace tomorrow every hour and perform flexion extensions 10 times and then reapply the brace.  The dressings will be taken down tomorrow for wound evaluation and removal of Telfa strips.  New dressings will be reapplied physical therapy will be consulted.  I have advised the patient that if his pain is tolerable that he should avoid narcotic pain medications, however if he requires pain medication he should continue.  I explained to the patient that the use of narcotics may constipate him for which he will be prescribed a laxative. Elmore Guise 10/28/2018
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
## 2018-10-28 NOTE — Anesthesia Postprocedure Evaluation (Signed)
Anesthesia Post Note  Patient: Andrew Lyons  Procedure(s) Performed: HARDWARE REMOVAL (Right ) AMPUTATION BELOW KNEE (Right )  Patient location during evaluation: PACU Anesthesia Type: General Level of consciousness: awake and alert Pain management: pain level controlled Vital Signs Assessment: post-procedure vital signs reviewed and stable Respiratory status: spontaneous breathing, nonlabored ventilation, respiratory function stable and patient connected to nasal cannula oxygen Cardiovascular status: blood pressure returned to baseline and stable Postop Assessment: no apparent nausea or vomiting Anesthetic complications: no     Last Vitals:  Vitals:   10/28/18 0409 10/28/18 0807  BP: 124/66 (!) 149/73  Pulse: 66 68  Resp: 18 18  Temp: 36.7 C 36.7 C  SpO2: 95% 97%    Last Pain:  Vitals:   10/28/18 0807  TempSrc: Oral  PainSc:                  [name]

## 2018-10-28 NOTE — Progress Notes (Signed)
  Subjective: 1 Day Post-Op Procedure(s) (LRB): HARDWARE REMOVAL (Right) AMPUTATION BELOW KNEE (Right) Patient reports pain as mild.   Patient seen in rounds with Dr. Posey Pronto. Patient is well, and has had no acute complaints or problems Plan is to go Home versus rehab after hospital stay. Negative for chest pain and shortness of breath Fever: no Gastrointestinal: Negative for nausea and vomiting  Objective: Vital signs in last 24 hours: Temp:  [97.8 F (36.6 C)-98.6 F (37 C)] 98.1 F (36.7 C) (02/02 0807) Pulse Rate:  [66-96] 68 (02/02 0807) Resp:  [9-20] 18 (02/02 0807) BP: (107-149)/(48-73) 149/73 (02/02 0807) SpO2:  [90 %-97 %] 97 % (02/02 0807)  Intake/Output from previous day:  Intake/Output Summary (Last 24 hours) at 10/28/2018 0811 Last data filed at 10/28/2018 0521 Gross per 24 hour  Intake 400 ml  Output 2950 ml  Net -2550 ml    Intake/Output this shift: No intake/output data recorded.  Labs: Recent Labs    10/27/18 0455 10/28/18 0331  HGB 11.8* 11.6*   Recent Labs    10/27/18 0455 10/28/18 0331  WBC 9.1 7.7  RBC 3.91* 3.81*  HCT 35.7* 35.1*  PLT 203 226   Recent Labs    10/27/18 0455 10/28/18 0331  NA 136 138  K 3.8 4.5  CL 108 107  CO2 21* 25  BUN 28* 19  CREATININE 1.08 0.85  GLUCOSE 171* 243*  CALCIUM 8.3* 8.2*   No results for input(s): LABPT, INR in the last 72 hours.   EXAM General - Patient is Alert and Oriented Extremity - Compartment soft with the knee immobilizer in place and incomplete extension. Dressing/Incision - clean, dry, no drainage Motor Function - intact, moving foot and toes well on exam.   Past Medical History:  Diagnosis Date  . 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 Barrett's esophagus with dysplasia   . COPD (chronic obstructive pulmonary disease) ([place])   . Coronary artery disease   . Diabetes mellitus without complication ([place])   . GERD (gastroesophageal reflux disease)   . Hepatitis   .  Hyperlipidemia   . Hypertension   . Myocardial infarction ([place])   . Seizures ([place])   . Sleep apnea     Assessment/Plan: 1 Day Post-Op Procedure(s) (LRB): HARDWARE REMOVAL (Right) AMPUTATION BELOW KNEE (Right) Active Problems:   Gangrene (HCC)  Estimated body mass index is 35.15 kg/m as calculated from the following:   Height as of this encounter: 5\' 10"  (1.778 m).   Weight as of this encounter: 111.1 kg. Advance diet Up with therapy D/C IV fluids  DVT Prophylaxis - Aspirin and Lovenox   Reche Dixon, PA-C Orthopaedic Surgery 10/28/2018, 8:11 AM
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
## 2018-10-29 ENCOUNTER — Encounter: Payer: Self-pay | Admitting: Orthopedic Surgery

## 2018-10-29 ENCOUNTER — Telehealth: Payer: Self-pay | Admitting: Family Medicine

## 2018-10-29 DIAGNOSIS — Z89511 Acquired absence of right leg below knee: Secondary | ICD-10-CM

## 2018-10-29 LAB — GLUCOSE, CAPILLARY
GLUCOSE-CAPILLARY: 171 mg/dL — AB (ref 70–99)
Glucose-Capillary: 154 mg/dL — ABNORMAL HIGH (ref 70–99)
Glucose-Capillary: 174 mg/dL — ABNORMAL HIGH (ref 70–99)
Glucose-Capillary: 159 mg/dL — ABNORMAL HIGH (ref 70–99)

## 2018-10-29 MED ORDER — IBUPROFEN 400 MG PO TABS
600.0000 mg | ORAL_TABLET | Freq: Four times a day (QID) | ORAL | Status: DC
  Administered 2018-10-29 – 2018-10-30 (×6): 600 mg via ORAL
  Filled 2018-10-29 (×6): qty 2

## 2018-10-29 MED ORDER — SENNOSIDES-DOCUSATE SODIUM 8.6-50 MG PO TABS
2.0000 | ORAL_TABLET | Freq: Two times a day (BID) | ORAL | Status: DC
  Administered 2018-10-29 – 2018-10-30 (×3): 2 via ORAL
  Filled 2018-10-29 (×3): qty 2

## 2018-10-29 MED ORDER — MAGNESIUM HYDROXIDE 400 MG/5ML PO SUSP
30.0000 mL | Freq: Every day | ORAL | Status: DC
  Administered 2018-10-29 – 2018-10-30 (×2): 30 mL via ORAL
  Filled 2018-10-29 (×2): qty 30

## 2018-10-29 MED ORDER — OXYCODONE HCL 5 MG PO TABS: 5.0000 mg | ORAL_TABLET | Freq: Four times a day (QID) | ORAL | Status: DC | PRN

## 2018-10-29 MED ORDER — OXYCODONE HCL 5 MG PO TABS: 10.0000 mg | ORAL_TABLET | Freq: Three times a day (TID) | ORAL | Status: DC | PRN

## 2018-10-29 NOTE — Telephone Encounter (Signed)
Patient needs note sent to [facility] stating he has had a sleep study within the last 10 years. However, I looked in the [place] and the last one I see is dated 02/22/2007.   If this is truly the last one, he will have to have another sleep study done to be able to get supplies.     I have printed the sleep study out and sent it back.

## 2018-10-29 NOTE — Progress Notes (Signed)
[facility]  Daily Progress Note   Subjective  - 2 Days Post-Op  Patient says he is now pain-free.  Very pleased with his progress so far.  Dressing is intact and was changed by nursing within the last couple of hours.  Objective Vitals:   10/28/18 1551 10/28/18 2341 10/29/18 0759 10/29/18 1540  BP: (!) 144/62 131/66 (!) 156/81 137/67  Pulse: 66 65 68 62  Resp: 18 19 17    Temp: 97.6 F (36.4 C) 97.9 F (36.6 C) 98 F (36.7 C) 98.2 F (36.8 C)  TempSrc: Oral Oral Oral Oral  SpO2: 97%  98% 98%  Weight:      Height:        Intake/Output Summary (Last 24 hours) at 10/29/2018 1729 Last data filed at 10/29/2018 0957 Gross per 24 hour  Intake 480 ml  Output -  Net 480 ml    PULM  CTAB CV  RRR VASC  right knee immobilizer in place with good straight right knee.  Dressing in place  Laboratory CBC    Component Value Date/Time   WBC 7.7 10/28/2018 0331   HGB 11.6 (L) 10/28/2018 0331   HGB 14.8 08/22/2018 0756   HCT 35.1 (L) 10/28/2018 0331   HCT 42.2 08/22/2018 0756   PLT 226 10/28/2018 0331   PLT 197 08/22/2018 0756    BMET    Component Value Date/Time   NA 138 10/28/2018 0331   NA 140 08/22/2018 0756   K 4.5 10/28/2018 0331   CL 107 10/28/2018 0331   CO2 25 10/28/2018 0331   GLUCOSE 243 (H) 10/28/2018 0331   BUN 19 10/28/2018 0331   BUN 18 08/22/2018 0756   CREATININE 0.85 10/28/2018 0331   CALCIUM 8.2 (L) 10/28/2018 0331   GFRNONAA >60 10/28/2018 0331   GFRAA >60 10/28/2018 0331    Assessment/Planning: POD #2 s/p right BKA   Overall seems to be doing very well.  As his dressing was recently replaced by nursing, we will wait to take this down until tomorrow and remove the Telfa strips.  Physical therapy and Occupational Therapy can begin seeing the patient.  He may or may not need rehab at discharge which would be in the next couple of days I would expect.    Leotis Pain  10/29/2018, 5:29 PM
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
## 2018-10-29 NOTE — Care Management (Signed)
Patient's wife called asking to see me.  I went in the room and spoke with the wife and the patient. She asked about a CPAP.  I explained that the Sleep study had to be within 10 years.  The spouse called the PCP about the sleep study. The patient has decided to choose [facility] for [facility] PT and nursing Notified [facility] Corene Cornea of the patient choice

## 2018-10-29 NOTE — Care Management Important Message (Signed)
Important Message  Patient Details  Name: Andrew Lyons MRN: [id number] Date of Birth: [birth year]   Medicare Important Message Given:  Yes    Juliann Pulse A Dyanara Cozza 10/29/2018, 10:55 AM

## 2018-10-29 NOTE — Progress Notes (Signed)
OT Cancellation Note  Patient Details Name: Andrew Lyons MRN: [id number] DOB: Oct 10, 1945   Cancelled Treatment:    Reason Eval/Treat Not Completed: Other (comment). New consult received, chart reviewed. Upon attempt, pt in bathroom, spouse in room requesting OT come back at later time. Will re-attempt Ot re-evaluation later today.   Jeni Salles, MPH, MS, OTR/L ascom [phone] 10/29/18, 9:49 AM

## 2018-10-29 NOTE — Plan of Care (Signed)
  Problem: Coping: Goal: Level of anxiety will decrease Outcome: Progressing   Problem: Pain Managment: Goal: General experience of comfort will improve Outcome: Progressing   

## 2018-10-29 NOTE — Evaluation (Addendum)
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 PTA pt amb with BKA prosthesis, however, 2/2 persistent wound difficulty at end of residual limb, pt has been selectively NWB for several prolonged periods, with strength loss.   Clinical Impression   Pt seen for re-eval and treatment following R BKA surgery on 10/28/2018. Pt cleared to work with therapy. Pt reporting no pain and agreeable to therapy. Pt denies significant functional change since surgery, as he had previously lived with a R foot amputation since his early 20's. Pt remains mod indep for ADL tasks, using his axillary crutches for functional mobility for ADL. Pt instructed in desensitization strategies to decrease sensitivity to stump in preparation for future prosthesis. Pt instructed in knee immobilizer positioning, donning/doffing, and wear schedule. KI adjusted to optimize positioning. Pt also educated in routines modifications and home set up strategies to minimize falls risk and maximize access to items in the home when family isn't around to assist. Pt verbalized understanding to all education/training provided. Per chart review, plan for OP PT after hospitalization for R BKA rehab. No further skilled OT services warranted at this time. Pt in agreement with plan. Will sign off. Please re-consult if additional needs arise.     Follow Up Recommendations  No OT follow up    Equipment Recommendations  None recommended by OT    Recommendations for Other Services       Precautions / Restrictions Precautions Precautions: Fall Required Braces or Orthoses: Knee Immobilizer - Right Knee Immobilizer - Right: On at all times Restrictions Weight Bearing  Restrictions: Yes RLE Weight Bearing: Non weight bearing Other Position/Activity Restrictions: R BKA- LLE wt bearing for ambulation      Mobility Bed Mobility Overal bed mobility: Independent                Transfers Overall transfer level: Modified independent Equipment used: Crutches Transfers: Sit to/from Stand Sit to Stand: Modified independent (Device/Increase time)              Balance Overall balance assessment: No apparent balance deficits (not formally assessed)                                         ADL either performed or assessed with clinical judgement   ADL Overall ADL's : Modified independent                                       General ADL Comments: Pt near baseline for ADL tasks     Vision Baseline Vision/History: Wears glasses Wears Glasses: Reading only;At all times Patient Visual Report: No change from baseline       Perception     Praxis      Pertinent Vitals/Pain Pain Assessment: No/denies pain     Hand Dominance Right   Extremity/Trunk Assessment Upper Extremity Assessment Upper Extremity Assessment: Overall WFL for tasks assessed   Lower Extremity Assessment Lower Extremity Assessment: Defer to PT evaluation;RLE deficits/detail RLE Deficits / Details: hip flex 5/5, knee ext/flex 5/5, hip/knee ROM WFL RLE: Unable to  fully assess due to immobilization   Cervical / Trunk Assessment Cervical / Trunk Assessment: Normal   Communication Communication Communication: No difficulties   Cognition Arousal/Alertness: Awake/alert Behavior During Therapy: WFL for tasks assessed/performed Overall Cognitive Status: Within Functional Limits for tasks assessed                                     General Comments  R BKA stump dressed, knee immobilizer adjusted to optimize positioning and comfort    Exercises Other Exercises Other Exercises: Pt instructed in knee immobilizer mgt and  how to adjust positioning, optimal positioning, and donning/doffing. Pt verbalized understanding. Pt also instructed in RLE knee ext/flex ex to perform and positioning in knee extension to optimize healing ROM Other Exercises: Pt instructed in routines modifications and AE to utilize upon return home to support improved set up, safety, and access to items during the day when family is not available immediately to assist.   Shoulder Instructions      Home Living Family/patient expects to be discharged to:: Private residence Living Arrangements: Spouse/significant other Available Help at Discharge: Family;Available 24 hours/day Type of Home: House Home Access: Stairs to enter [employer] of Steps: 5 Entrance Stairs-Rails: Left(wall on R) Home Layout: One level     Bathroom Shower/Tub: Teacher, early years/pre: Standard     Home Equipment: Crutches;Other (comment);Shower seat;Bedside commode   Additional Comments: knee scooter      Prior Functioning/Environment Level of Independence: Independent with assistive device(s)        Comments: Pt mod indep w/ mobility using knee scooter primarily, crutches for outside short community distances, no falls, mod indep with ADL, picks up grandkids from school in the afternoons and watches them for a couple hours during the week        OT Problem List: Decreased knowledge of precautions;Decreased range of motion;Impaired balance (sitting and/or standing)      OT Treatment/Interventions:      OT Goals(Current goals can be found in the care plan section) Acute Rehab OT Goals Patient Stated Goal: return home and return to PLOF  OT Goal Formulation: All assessment and education complete, DC therapy  OT Frequency:     Barriers to D/C:            Co-evaluation              AM-PAC OT "6 Clicks" Daily Activity     Outcome Measure Help from another person eating meals?: None Help from another person taking care  of personal grooming?: None Help from another person toileting, which includes using toliet, bedpan, or urinal?: None Help from another person bathing (including washing, rinsing, drying)?: None Help from another person to put on and taking off regular upper body clothing?: None Help from another person to put on and taking off regular lower body clothing?: None 6 Click Score: 24   End of Session    Activity Tolerance: Patient tolerated treatment well Patient left: in bed;with call bell/phone within reach;with bed alarm set  OT Visit Diagnosis: Other abnormalities of gait and mobility (R26.89)                Time: 3151-7616 OT Time Calculation (min): 24 min Charges:  OT General Charges $OT Visit: 1 Visit OT Evaluation $OT Re-eval: 1 Re-eval OT Treatments $Self Care/Home Management : 8-22 mins $Therapeutic Activity: 8-22 mins  Jeni Salles, MPH, MS,  OTR/L ascom [phone] 10/29/18, 4:19 PM

## 2018-10-30 ENCOUNTER — Ambulatory Visit: Payer: PPO | Admitting: Physician Assistant

## 2018-10-30 DIAGNOSIS — I739 Peripheral vascular disease, unspecified: Secondary | ICD-10-CM

## 2018-10-30 LAB — GLUCOSE, CAPILLARY
Glucose-Capillary: 190 mg/dL — ABNORMAL HIGH (ref 70–99)
Glucose-Capillary: 121 mg/dL — ABNORMAL HIGH (ref 70–99)

## 2018-10-30 MED ORDER — IBUPROFEN 400 MG PO TABS: 400.0000 mg | ORAL_TABLET | Freq: Four times a day (QID) | ORAL | 0 refills | Status: DC | PRN

## 2018-10-30 NOTE — Telephone Encounter (Signed)
Patient was advised.  

## 2018-10-30 NOTE — Discharge Instructions (Signed)
Patient may shower.  Please keep groin access site clean and dry. Keep wound clean and dry.   Stump and Prosthesis Care When an arm or leg is removed, it is important to care for the artificial body part that replaces it (prosthesis) and for the remaining end of the arm or leg (stump). Caring for the stump and prosthesis will help you stay comfortable, active, and healthy. How to care for your stump Cleaning your skin  Wash your stump with a mild antibacterial soap at least once each day.  Wash your stump after getting dirty or sweaty.  After washing your stump, pat it dry and let it air-dry for 5-10 minutes.  Do not soak your stump in a warm or hot bath for longer than 20 minutes at a time.  Avoid shaving hair on the stump. Hair that grows out after being shaved is more easily irritated by the prosthesis. Using skin care products  Apply ointment to your surgical scar if your health care provider told you to do so. This can keep the scar soft and help it heal.  Do not put creams and lotions on your stump unless directed by your health care provider. If your health care provider says it is okay to put creams and lotions on your stump, do not use lotions that contain petroleum jelly.  Do not use skin care products with an alcohol base. These products can be harmful to your skin. They can also damage the lining of the prosthesis.  Consider using an antiperspirant spray on the skin of the stump if you are prone to sweating. Other instructions  Every day, look closely at the skin on your stump. Use a mirror with a long handle to check areas you cannot see, or ask a friend or family member to check those areas. Look for areas that appear reddish, swollen, or irritated. Pay extra attention to places where the stump and prosthesis rub together. Tell your health care provider if you have concerns.  Wear the elastic wrap on your stump as told by your health care provider. The wrap may become  loose or your stump may swell when you are active. Rewrap the bandage every couple of hours as needed. How to care for your prosthesis Cleaning your prosthesis  Use hot water and antibacterial soap to wash your prosthesis.  Dry the prosthesis using a clean towel. Attaching your prosthesis  Make sure your prosthesis is clean before you attach it to your stump. Clean and dry all the parts that touch your skin.  Wear socks or wraps under the prosthesis.  Attach your prosthesis to your stump. Be sure you understand how to attach it. A prosthetic specialist (prosthetist) can show you how to do this. It is a good idea to practice several times while he or she watches. Other instructions      Exercise and move your prosthesis as told by your physical therapist.  Follow your health care provider's instructions about the length of time you should wear your prosthesis. You may be instructed to: ? Limit the amount of time you wear your prosthesis at first. ? Increase the time you wear your prosthesis a little bit each day. Where to find more information  The Amputee Coalition: https://www.amputee-coalition.org/ Contact a health care provider if:  The prosthesis does not seem to fit correctly.  You have an itchy rash or a sore on your stump.  Sweating between the stump and the prosthesis is heavy, and efforts to  control the sweating do not work. Get help right away if:  Your stump is painful to the touch, or it is red, swollen, or hot.  A bad smell develops around the stump.  There is a sore on your stump that is not healing.  Your stump is colder than the upper part of your limb.  Skin on your stump turns gray or black.  There is drainage coming from your stump. Summary  When an arm or leg is removed, it is important to care for the artificial body part that replaces it (prosthesis) and for the remaining end of the arm or leg (stump).  Wash your stump with a mild antibacterial  soap at least once each day or if it gets dirty or sweaty. Then, pat it dry and let it air-dry for 5-10 minutes.  Follow your health care provider's instructions on the use of skin care products on your stump.  Every day, closely check the skin on your stump. Look for areas that appear reddish, swollen, or irritated. Tell your health care provider if you have concerns.  Make sure your prosthesis is clean before you attach it to your stump. Clean and dry all the parts that touch your skin. This information is not intended to replace advice given to you by your health care provider. Make sure you discuss any questions you have with your health care provider. Document Released: 12/07/2009 Document Revised: 11/01/2017 Document Reviewed: 11/01/2017 Elsevier Interactive Patient Education  2019 [employer].

## 2018-10-30 NOTE — Telephone Encounter (Signed)
This should wait.  He just had part of his lower leg amputated yesterday.

## 2018-10-30 NOTE — Progress Notes (Signed)
[facility]  Daily Progress Note   Subjective: 4 Days Post-Op: 1.Ultrasound guidance for vascular accessleftfemoral artery 2.Catheter placement into right common femoral artery from left femoral approach 3.Aortogram and selectiverightlower extremity angiogram 4.Percutaneous transluminal angioplasty ofthe right anterior tibial artery with 2.5 mm diameter by 30 cm length angioplasty balloon 5.Percutaneous transluminal angioplasty of the right popliteal artery in the mid to distal superficial femoral artery with a 5 mm diameter by 22 cm length and a 5 mm diameter by 10 cm length Lutonix drug-coated angioplasty balloon 6.StarClose closure device leftfemoral artery  3 Days Post-Op: Right Below The Knee Amputation  Patient doing well. Pleased with progress made s/p right BKA. No issues overnight. Patient being discharged home today.   Objective: Vitals:   10/29/18 0759 10/29/18 1540 10/29/18 2325 10/30/18 0751  BP: (!) 156/81 137/67 (!) 141/64 138/71  Pulse: 68 62 (!) 57 60  Resp: 17  18 20   Temp: 98 F (36.7 C) 98.2 F (36.8 C) 98 F (36.7 C) 98 F (36.7 C)  TempSrc: Oral Oral Oral Oral  SpO2: 98% 98% 97% 98%  Weight:      Height:        Intake/Output Summary (Last 24 hours) at 10/30/2018 1307 Last data filed at 10/30/2018 6010 Gross per 24 hour  Intake 723 ml  Output -  Net 723 ml   Physical Exam: A&Ox3, NAD CV: RRR Pulmonary: CTA Bilaterally Abdomen: Soft, Nontender, Nondistended Vascular:  Right Lower Extremity: Thigh soft, knee flexible at the joint, OR dressing removed. Tefla strips removed. Sutures intact, clean and dry. Stump healthy. No erythema. New dressing applied.    Laboratory: CBC    Component Value Date/Time   WBC 7.7 10/28/2018 0331   HGB 11.6 (L) 10/28/2018 0331   HGB 14.8 08/22/2018 0756   HCT 35.1 (L) 10/28/2018 0331   HCT 42.2 08/22/2018 0756   PLT 226 10/28/2018 0331   PLT 197 08/22/2018 0756   BMET     Component Value Date/Time   NA 138 10/28/2018 0331   NA 140 08/22/2018 0756   K 4.5 10/28/2018 0331   CL 107 10/28/2018 0331   CO2 25 10/28/2018 0331   GLUCOSE 243 (H) 10/28/2018 0331   BUN 19 10/28/2018 0331   BUN 18 08/22/2018 0756   CREATININE 0.85 10/28/2018 0331   CALCIUM 8.2 (L) 10/28/2018 0331   GFRNONAA >60 10/28/2018 0331   GFRAA >60 10/28/2018 0331   Assessment/Planning: The patient is a 73 year old male with right lower extremity PAD s/p right endovascular intervention, s/p right BKA 1) OR dressing removed. Telfa strips removed. Stump healing well.  2) Patient to follow up in one week for wound check. 3) Had conversation regarding keeping his knee joint flexible. He expresses his understanding. 4) OK to discharge from a vascular standpoint when medically ready  Discussed with Dr. Ellis Parents [facility] PA-C 10/30/2018 1:07 PM

## 2018-10-30 NOTE — Progress Notes (Signed)
NT escorted patient to car via wc. Wife to transport patient home.  Bethann Punches, RN

## 2018-10-30 NOTE — Progress Notes (Signed)
Patient is alert and oriented and able to verbalize needs. No complaints of pain. Daughter at bedside. VSS. PIV removed. Printed AVS and scripts for oxycodone and augmentin given to patient. All discharge instructions and follow up care gone over with patient at this time. Patient and daughter verbalize understanding of all discharge information. No concerns voiced. All belongings packed up.   Bethann Punches, RN

## 2018-10-31 ENCOUNTER — Telehealth: Payer: Self-pay

## 2018-10-31 NOTE — Telephone Encounter (Signed)
Transition Care Management Follow-up Telephone Call  Date of discharge and from where: [facility] on 10/30/18.  How have you been since you were released from the hospital? Doing well, wound looks good. Declines fever, pain, swelling, signs of wound infection or n/v/d.  Any questions or concerns? No   Items Reviewed:  Did the pt receive and understand the discharge instructions provided? Yes   Medications obtained and verified? Yes   Any new allergies since your discharge? No   Dietary orders reviewed? Yes  Do you have support at home? Yes   Other (ie: DME, Home Health, etc) [place] nurse will be coming out to evaluate pt tomorrow for home health.   Functional Questionnaire: (I = Independent and D = Dependent)  Bathing/Dressing- I   Meal Prep- I  Eating- I  Maintaining continence- I  Transferring/Ambulation- Using crutches currently.  Managing Meds- I   Follow up appointments reviewed:    PCP Hospital f/u appt confirmed? Yes  Scheduled to see Dr. Rosanna Randy on 11/05/17 @ 9:40 AM.  [facility] f/u appt confirmed? Yes    Are transportation arrangements needed? No   If their condition worsens, is the pt aware to call  their PCP or go to the ED? Yes  Was the patient provided with contact information for the PCP's office or ED? Yes  Was the pt encouraged to call back with questions or concerns? Yes

## 2018-11-01 DIAGNOSIS — E119 Type 2 diabetes mellitus without complications: Secondary | ICD-10-CM | POA: Diagnosis not present

## 2018-11-01 DIAGNOSIS — E785 Hyperlipidemia, unspecified: Secondary | ICD-10-CM | POA: Diagnosis not present

## 2018-11-01 DIAGNOSIS — I2510 Atherosclerotic heart disease of native coronary artery without angina pectoris: Secondary | ICD-10-CM | POA: Diagnosis not present

## 2018-11-01 DIAGNOSIS — K22719 Barrett's esophagus with dysplasia, unspecified: Secondary | ICD-10-CM | POA: Diagnosis not present

## 2018-11-01 DIAGNOSIS — J449 Chronic obstructive pulmonary disease, unspecified: Secondary | ICD-10-CM | POA: Diagnosis not present

## 2018-11-01 DIAGNOSIS — Z89511 Acquired absence of right leg below knee: Secondary | ICD-10-CM | POA: Diagnosis not present

## 2018-11-01 DIAGNOSIS — I252 Old myocardial infarction: Secondary | ICD-10-CM | POA: Diagnosis not present

## 2018-11-01 DIAGNOSIS — Z7984 Long term (current) use of oral hypoglycemic drugs: Secondary | ICD-10-CM | POA: Diagnosis not present

## 2018-11-01 DIAGNOSIS — T8743 Infection of amputation stump, right lower extremity: Secondary | ICD-10-CM | POA: Diagnosis not present

## 2018-11-01 DIAGNOSIS — I350 Nonrheumatic aortic (valve) stenosis: Secondary | ICD-10-CM | POA: Diagnosis not present

## 2018-11-01 DIAGNOSIS — I10 Essential (primary) hypertension: Secondary | ICD-10-CM | POA: Diagnosis not present

## 2018-11-01 DIAGNOSIS — G4730 Sleep apnea, unspecified: Secondary | ICD-10-CM | POA: Diagnosis not present

## 2018-11-01 DIAGNOSIS — Z95820 Peripheral vascular angioplasty status with implants and grafts: Secondary | ICD-10-CM | POA: Diagnosis not present

## 2018-11-01 DIAGNOSIS — Z7982 Long term (current) use of aspirin: Secondary | ICD-10-CM | POA: Diagnosis not present

## 2018-11-01 DIAGNOSIS — Z87891 Personal history of nicotine dependence: Secondary | ICD-10-CM | POA: Diagnosis not present

## 2018-11-01 DIAGNOSIS — R569 Unspecified convulsions: Secondary | ICD-10-CM | POA: Diagnosis not present

## 2018-11-01 DIAGNOSIS — Z7902 Long term (current) use of antithrombotics/antiplatelets: Secondary | ICD-10-CM | POA: Diagnosis not present

## 2018-11-01 DIAGNOSIS — N400 Enlarged prostate without lower urinary tract symptoms: Secondary | ICD-10-CM | POA: Diagnosis not present

## 2018-11-01 LAB — SURGICAL PATHOLOGY

## 2018-11-02 ENCOUNTER — Telehealth: Payer: Self-pay | Admitting: Family Medicine

## 2018-11-02 LAB — AEROBIC/ANAEROBIC CULTURE W GRAM STAIN (SURGICAL/DEEP WOUND)
Culture: NO GROWTH
Gram Stain: NONE SEEN

## 2018-11-02 LAB — AEROBIC/ANAEROBIC CULTURE (SURGICAL/DEEP WOUND)
CULTURE: NO GROWTH
GRAM STAIN: NONE SEEN

## 2018-11-02 NOTE — Telephone Encounter (Signed)
Carlota Raspberry RN w/ [place] [phone]  Verbal Order: 1 time a week for 3 weeks to monitor surgical incision and diabetic education.  Please advise.  Thanks, [employer]

## 2018-11-02 NOTE — Telephone Encounter (Signed)
Advised  ED 
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
## 2018-11-05 ENCOUNTER — Inpatient Hospital Stay: Payer: PPO | Admitting: Family Medicine

## 2018-11-06 ENCOUNTER — Ambulatory Visit (INDEPENDENT_AMBULATORY_CARE_PROVIDER_SITE_OTHER): Payer: PPO | Admitting: Family Medicine

## 2018-11-06 ENCOUNTER — Encounter (INDEPENDENT_AMBULATORY_CARE_PROVIDER_SITE_OTHER): Payer: Self-pay | Admitting: Vascular Surgery

## 2018-11-06 ENCOUNTER — Ambulatory Visit (INDEPENDENT_AMBULATORY_CARE_PROVIDER_SITE_OTHER): Payer: PPO | Admitting: Vascular Surgery

## 2018-11-06 ENCOUNTER — Encounter: Payer: Self-pay | Admitting: Family Medicine

## 2018-11-06 VITALS — BP 126/82 | Temp 98.4°F | Resp 16 | Wt 237.0 lb

## 2018-11-06 VITALS — BP 136/72 | HR 68 | Resp 14

## 2018-11-06 DIAGNOSIS — I739 Peripheral vascular disease, unspecified: Secondary | ICD-10-CM | POA: Insufficient documentation

## 2018-11-06 DIAGNOSIS — I10 Essential (primary) hypertension: Secondary | ICD-10-CM

## 2018-11-06 DIAGNOSIS — E118 Type 2 diabetes mellitus with unspecified complications: Secondary | ICD-10-CM

## 2018-11-06 DIAGNOSIS — E1159 Type 2 diabetes mellitus with other circulatory complications: Secondary | ICD-10-CM | POA: Diagnosis not present

## 2018-11-06 DIAGNOSIS — I2510 Atherosclerotic heart disease of native coronary artery without angina pectoris: Secondary | ICD-10-CM | POA: Diagnosis not present

## 2018-11-06 DIAGNOSIS — Z6838 Body mass index (BMI) 38.0-38.9, adult: Secondary | ICD-10-CM | POA: Diagnosis not present

## 2018-11-06 DIAGNOSIS — R351 Nocturia: Secondary | ICD-10-CM | POA: Diagnosis not present

## 2018-11-06 DIAGNOSIS — Z89511 Acquired absence of right leg below knee: Secondary | ICD-10-CM | POA: Diagnosis not present

## 2018-11-06 DIAGNOSIS — Z87891 Personal history of nicotine dependence: Secondary | ICD-10-CM

## 2018-11-06 DIAGNOSIS — N3281 Overactive bladder: Secondary | ICD-10-CM | POA: Diagnosis not present

## 2018-11-06 DIAGNOSIS — E1151 Type 2 diabetes mellitus with diabetic peripheral angiopathy without gangrene: Secondary | ICD-10-CM

## 2018-11-06 DIAGNOSIS — E7800 Pure hypercholesterolemia, unspecified: Secondary | ICD-10-CM

## 2018-11-06 DIAGNOSIS — N401 Enlarged prostate with lower urinary tract symptoms: Secondary | ICD-10-CM | POA: Diagnosis not present

## 2018-11-06 DIAGNOSIS — T8740 Infection of amputation stump, unspecified extremity: Secondary | ICD-10-CM

## 2018-11-06 DIAGNOSIS — G4733 Obstructive sleep apnea (adult) (pediatric): Secondary | ICD-10-CM

## 2018-11-06 DIAGNOSIS — K22719 Barrett's esophagus with dysplasia, unspecified: Secondary | ICD-10-CM

## 2018-11-06 DIAGNOSIS — R350 Frequency of micturition: Secondary | ICD-10-CM | POA: Diagnosis not present

## 2018-11-06 NOTE — Assessment & Plan Note (Signed)
Doing well.  S/p right leg intervention but subsequent BKA with ulceration and infection. Check flow in about three months.

## 2018-11-06 NOTE — Assessment & Plan Note (Signed)
blood pressure control important in reducing the progression of atherosclerotic disease. On appropriate oral medications.  

## 2018-11-06 NOTE — Progress Notes (Signed)
MRN : [id number]  LEMAN Lyons is a 73 y.o. (11-24-1945) male who presents with chief complaint of  Chief Complaint  Patient presents with  . Follow-up  .  History of Present Illness: Patient returns today in follow up of his PAD and right BKA.  We had performed an angiogram on him but he ultimately went on to have an amputation by a covering physician and an orthopedic surgeon to remove his hardware about 10 days ago.  He is doing well with that. He has little pain.  He has been wearing a knee immobilizer and his knee is straight with full mobility. No signs of infection.   Current Outpatient Medications  Medication Sig Dispense Refill  . amLODipine-benazepril (LOTREL) 5-40 MG capsule TAKE 1 CAPSULE BY MOUTH EVERY DAY (Patient taking differently: Take 1 capsule by mouth daily. ) 90 capsule 3  . amoxicillin-clavulanate (AUGMENTIN) 875-125 MG tablet Take 1 tablet by mouth every 12 (twelve) hours. 18 tablet 0  . aspirin EC 81 MG tablet Take 1 tablet (81 mg total) by mouth daily. 30 tablet 2  . B-D UF III MINI PEN NEEDLES 31G X 5 MM MISC USE WITH PEN DAILY 100 each 3  . Blood Glucose Monitoring Suppl (ONE TOUCH ULTRA SYSTEM KIT) w/Device KIT To use daily to check sugar. DX E11.9-needs one touch ultra meter 1 each 0  . cholecalciferol (VITAMIN D) 25 MCG (1000 UT) tablet Take 1,000 Units by mouth daily.     . clopidogrel (PLAVIX) 75 MG tablet Take 1 tablet (75 mg total) by mouth daily. 30 tablet 6  . Coenzyme Q10 100 MG TABS Take 100 mg by mouth daily.     . fluticasone (FLONASE) 50 MCG/ACT nasal spray USE 2 SPRAYS IN EACH NOSTRIL EVERY DAY (Patient taking differently: Place 2 sprays into both nostrils daily. ) 48 g 3  . glucose blood (ONE TOUCH ULTRA TEST) test strip Check sugar 3 times daily. DX E11.9-strips for one touch ultra meter 100 each 11  . JARDIANCE 25 MG TABS tablet TAKE 1 TABLET BY MOUTH DAILY (Patient taking differently: Take 25 mg by mouth daily. ) 30 tablet 10  .  liraglutide (VICTOZA) 18 MG/3ML SOPN INJECT 1.2 SUBCUTANEOUSLY EVERY DAY (Patient taking differently: Inject 1.2 mg into the skin daily. INJECT 1.2 SUBCUTANEOUSLY EVERY DAY) 9 pen 3  . metFORMIN (GLUCOPHAGE) 500 MG tablet TAKE 1 TABLET (500 MG TOTAL) BY MOUTH 2 (TWO) TIMES DAILY WITH A MEAL. 180 tablet 3  . Meth-Hyo-M Bl-Na Phos-Ph Sal (URIBEL) 118 MG CAPS Take 1 capsule (118 mg total) by mouth every 6 (six) hours as needed (dysuria). 40 capsule 3  . metoprolol succinate (TOPROL-XL) 100 MG 24 hr tablet TAKE 1 TABLET BY MOUTH EVERY DAY (Patient taking differently: Take 100 mg by mouth daily. ) 90 tablet 3  . montelukast (SINGULAIR) 10 MG tablet Take 1 tablet (10 mg total) by mouth daily. 90 tablet 3  . omeprazole (PRILOSEC) 40 MG capsule TAKE ONE CAPSULE BY MOUTH EVERY DAY (Patient taking differently: Take 40 mg by mouth daily. ) 90 capsule 3  . ONETOUCH DELICA LANCETS FINE MISC 1 Device by Does not apply route 3 (three) times daily. 100 each 11  . oxyCODONE (OXY IR/ROXICODONE) 5 MG immediate release tablet Take 1 tablet (5 mg total) by mouth every 6 (six) hours as needed for moderate pain. 16 tablet 0  . rosuvastatin (CRESTOR) 20 MG tablet TAKE 1 TABLET BY MOUTH EVERY DAY (Patient taking differently:  Take 20 mg by mouth daily. ) 90 tablet 2  . sildenafil (VIAGRA) 100 MG tablet Take 100 mg by mouth as needed for erectile dysfunction.     . vitamin B-12 (CYANOCOBALAMIN) 1000 MCG tablet Take 1,000 mcg by mouth daily.     No current facility-administered medications for this visit.     Past Medical History:  Diagnosis Date  . 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 Barrett's esophagus with dysplasia   . COPD (chronic obstructive pulmonary disease) ([place])   . Coronary artery disease   . Diabetes mellitus without complication ([place])   . GERD (gastroesophageal reflux disease)   . Hepatitis   . Hyperlipidemia   . Hypertension   . Myocardial infarction ([place])   . Seizures ([place])   . Sleep  apnea     Past Surgical History:  Procedure Laterality Date  . ABDOMINAL AORTOGRAM W/LOWER EXTREMITY N/A 06/20/2018   Procedure: ABDOMINAL AORTOGRAM W/LOWER EXTREMITY;  Surgeon: Wellington Hampshire, MD;  Location: [place] [facility];  Service: Cardiovascular;  Laterality: N/A;  . AMPUTATION Right 10/27/2018   Procedure: AMPUTATION BELOW KNEE;  Surgeon: Leim Fabry, MD;  Location: [facility];  Service: Orthopedics;  Laterality: Right;  . CARDIAC CATHETERIZATION     with Angioplasty  . cardiac stents    . CARDIAC VALVE REPLACEMENT     Aortic Valve Replacement  . COLONOSCOPY WITH PROPOFOL N/A 03/18/2015   Procedure: COLONOSCOPY WITH PROPOFOL;  Surgeon: [name], MD;  Location: [facility] ENDOSCOPY;  Service: Endoscopy;  Laterality: N/A;  . ESOPHAGOGASTRODUODENOSCOPY N/A 03/18/2015   Procedure: ESOPHAGOGASTRODUODENOSCOPY (EGD);  Surgeon: [name], MD;  Location: [facility] ENDOSCOPY;  Service: Endoscopy;  Laterality: N/A;  . FOOT AMPUTATION    . GREEN LIGHT LASER TURP (TRANSURETHRAL RESECTION OF PROSTATE N/A 10/16/2018   Procedure: GREEN LIGHT LASER TURP (TRANSURETHRAL RESECTION OF PROSTATE;  Surgeon: Royston Cowper, MD;  Location: [facility] ORS;  Service: Urology;  Laterality: N/A;  . HARDWARE REMOVAL Right 10/27/2018   Procedure: HARDWARE REMOVAL;  Surgeon: Leim Fabry, MD;  Location: [facility];  Service: Orthopedics;  Laterality: Right;  . LOWER EXTREMITY ANGIOGRAPHY Right 10/25/2018   Procedure: Lower Extremity Angiography;  Surgeon: Algernon Huxley, MD;  Location: [place] [facility];  Service: Cardiovascular;  Laterality: Right;  . PERIPHERAL VASCULAR INTERVENTION Right 06/20/2018   Procedure: PERIPHERAL VASCULAR INTERVENTION;  Surgeon: Wellington Hampshire, MD;  Location: [place] [facility];  Service: Cardiovascular;  Laterality: Right;  . THORACOTOMY Right   . TONSILLECTOMY      Social History Social History   Tobacco Use  . Smoking status: Former Smoker    Last attempt to quit: 10/02/2001     Years since quitting: 17.1  . Smokeless tobacco: Former Systems developer  . Tobacco comment: Quit smoking in 2003; Started smoking at age 12, smoked about 40 years, smoked over 3 packs per day  Substance Use Topics  . Alcohol use: Not Currently  . Drug use: No    Family History Family History  Problem Relation Age of Onset  . Obesity Son   . Diabetes Brother   . Hypertension Brother   . Heart attack Mother   . Heart attack Father     Allergies  Allergen Reactions  . Iodinated Diagnostic Agents Rash    had a red chest -unusre of what kind of dye it was  . Zyban [Bupropion] Rash  . Zocor [Simvastatin] Other (See Comments)    Reaction: hepatitis   .  Gadolinium Derivatives Rash     REVIEW OF SYSTEMS (Negative unless checked)  Constitutional: '[]'$ Weight loss  '[]'$ Fever  '[]'$ Chills Cardiac: '[]'$ Chest pain   '[]'$ Chest pressure   '[]'$ Palpitations   '[]'$ Shortness of breath when laying flat   '[]'$ Shortness of breath at rest   '[]'$ Shortness of breath with exertion. Vascular:  '[]'$ Pain in legs with walking   '[]'$ Pain in legs at rest   '[]'$ Pain in legs when laying flat   '[]'$ Claudication   '[]'$ Pain in feet when walking  '[]'$ Pain in feet at rest  '[]'$ Pain in feet when laying flat   '[]'$ History of DVT   '[]'$ Phlebitis   '[]'$ Swelling in legs   '[]'$ Varicose veins   '[x]'$ Non-healing ulcers Pulmonary:   '[]'$ Uses home oxygen   '[]'$ Productive cough   '[]'$ Hemoptysis   '[]'$ Wheeze  '[x]'$ COPD   '[]'$ Asthma Neurologic:  '[]'$ Dizziness  '[]'$ Blackouts   '[x]'$ Seizures   '[]'$ History of stroke   '[]'$ History of TIA  '[]'$ Aphasia   '[]'$ Temporary blindness   '[]'$ Dysphagia   '[]'$ Weakness or numbness in arms   '[]'$ Weakness or numbness in legs Musculoskeletal:  '[x]'$ Arthritis   '[]'$ Joint swelling   '[]'$ Joint pain   '[]'$ Low back pain Hematologic:  '[]'$ Easy bruising  '[]'$ Easy bleeding   '[]'$ Hypercoagulable state   '[]'$ Anemic   Gastrointestinal:  '[]'$ Blood in stool   '[]'$ Vomiting blood  '[x]'$ Gastroesophageal reflux/heartburn   '[x]'$ Abdominal pain Genitourinary:  '[]'$ Chronic kidney disease   '[]'$ Difficult urination  '[]'$ Frequent  urination  '[]'$ Burning with urination   '[]'$ Hematuria Skin:  '[]'$ Rashes   '[x]'$ Ulcers   '[x]'$ Wounds Psychological:  '[]'$ History of anxiety   '[]'$  History of major depression.  Physical Examination  BP 136/72 (BP Location: Right Arm, Patient Position: Sitting)   Pulse 68   Resp 14  Gen:  WD/WN, NAD Head: [place]/AT, No temporalis wasting. Ear/Nose/Throat: Hearing grossly intact, nares w/o erythema or drainage Eyes: Conjunctiva clear. Sclera non-icteric Neck: Supple.  Trachea midline Pulmonary:  Good air movement, no use of accessory muscles.  Cardiac: RRR, no JVD Vascular: right BKA is healing well.  Sutures in place.  Vessel Right Left  Radial Palpable Palpable                                    Musculoskeletal: M/S 5/5 throughout.   Neurologic: Sensation grossly intact in extremities.  Symmetrical.  Speech is fluent.  Psychiatric: Judgment intact, Mood & affect appropriate for pt's clinical situation. Dermatologic: No rashes or ulcers noted.  No cellulitis or open wounds.       Labs Recent Results (from the past 2160 hour(s))  HM DIABETES EYE EXAM     Status: None   Collection Time: 08/13/18 11:02 AM  Result Value Ref Range   HM Diabetic Eye Exam No Retinopathy No Retinopathy  CBC with Differential/Platelet     Status: None   Collection Time: 08/22/18  7:56 AM  Result Value Ref Range   WBC 7.1 3.4 - 10.8 x10E3/uL   RBC 4.71 4.14 - 5.80 x10E6/uL   Hemoglobin 14.8 13.0 - 17.7 g/dL   Hematocrit 42.2 37.5 - 51.0 %   MCV 90 79 - 97 fL   MCH 31.4 26.6 - 33.0 pg   MCHC 35.1 31.5 - 35.7 g/dL   RDW 14.3 12.3 - 15.4 %   Platelets 197 150 - 450 x10E3/uL   Neutrophils 66 Not Estab. %   Lymphs 20 Not Estab. %   Monocytes 10 Not Estab. %   Eos 3 Not Estab. %  Basos 1 Not Estab. %   Neutrophils Absolute 4.7 1.4 - 7.0 x10E3/uL   Lymphocytes Absolute 1.4 0.7 - 3.1 x10E3/uL   Monocytes Absolute 0.7 0.1 - 0.9 x10E3/uL   EOS (ABSOLUTE) 0.2 0.0 - 0.4 x10E3/uL   Basophils Absolute 0.1 0.0  - 0.2 x10E3/uL   Immature Granulocytes 0 Not Estab. %   Immature Grans (Abs) 0.0 0.0 - 0.1 x10E3/uL  Comprehensive metabolic panel     Status: Abnormal   Collection Time: 08/22/18  7:56 AM  Result Value Ref Range   Glucose 133 (H) 65 - 99 mg/dL   BUN 18 8 - 27 mg/dL   Creatinine, Ser 1.14 0.76 - 1.27 mg/dL   GFR calc non Af Amer 64 >59 mL/min/1.73   GFR calc Af Amer 74 >59 mL/min/1.73   BUN/Creatinine Ratio 16 10 - 24   Sodium 140 134 - 144 mmol/L   Potassium 4.4 3.5 - 5.2 mmol/L   Chloride 100 96 - 106 mmol/L   CO2 22 20 - 29 mmol/L   Calcium 9.9 8.6 - 10.2 mg/dL   Total Protein 6.8 6.0 - 8.5 g/dL   Albumin 4.9 (H) 3.5 - 4.8 g/dL   Globulin, Total 1.9 1.5 - 4.5 g/dL   Albumin/Globulin Ratio 2.6 (H) 1.2 - 2.2   Bilirubin Total 1.3 (H) 0.0 - 1.2 mg/dL   Alkaline Phosphatase 100 39 - 117 IU/L   AST 16 0 - 40 IU/L   ALT 11 0 - 44 IU/L  Hemoglobin A1c     Status: Abnormal   Collection Time: 08/22/18  7:56 AM  Result Value Ref Range   Hgb A1c MFr Bld 5.8 (H) 4.8 - 5.6 %    Comment:          Prediabetes: 5.7 - 6.4          Diabetes: >6.4          Glycemic control for adults with diabetes: <7.0    Est. average glucose Bld gHb Est-mCnc 120 mg/dL  Lipid panel     Status: Abnormal   Collection Time: 08/22/18  7:56 AM  Result Value Ref Range   Cholesterol, Total 133 100 - 199 mg/dL   Triglycerides 174 (H) 0 - 149 mg/dL   HDL 33 (L) >39 mg/dL   VLDL Cholesterol Cal 35 5 - 40 mg/dL   LDL Calculated 65 0 - 99 mg/dL   Chol/HDL Ratio 4.0 0.0 - 5.0 ratio    Comment:                                   T. Chol/HDL Ratio                                             Men  Women                               1/2 Avg.Risk  3.4    3.3                                   Avg.Risk  5.0    4.4  2X Avg.Risk  9.6    7.1                                3X Avg.Risk 23.4   11.0   TSH     Status: None   Collection Time: 08/22/18  7:56 AM  Result Value Ref Range   TSH 2.450  0.450 - 4.500 uIU/mL  Glucose, capillary     Status: Abnormal   Collection Time: 10/16/18  1:15 PM  Result Value Ref Range   Glucose-Capillary 193 (H) 70 - 99 mg/dL  Glucose, capillary     Status: Abnormal   Collection Time: 10/16/18  3:26 PM  Result Value Ref Range   Glucose-Capillary 157 (H) 70 - 99 mg/dL  CBC with Differential     Status: None   Collection Time: 10/24/18 11:34 PM  Result Value Ref Range   WBC 8.1 4.0 - 10.5 K/uL   RBC 4.69 4.22 - 5.81 MIL/uL   Hemoglobin 14.5 13.0 - 17.0 g/dL   HCT 42.3 39.0 - 52.0 %   MCV 90.2 80.0 - 100.0 fL   MCH 30.9 26.0 - 34.0 pg   MCHC 34.3 30.0 - 36.0 g/dL   RDW 14.4 11.5 - 15.5 %   Platelets 216 150 - 400 K/uL   nRBC 0.0 0.0 - 0.2 %   Neutrophils Relative % 70 %   Neutro Abs 5.7 1.7 - 7.7 K/uL   Lymphocytes Relative 16 %   Lymphs Abs 1.3 0.7 - 4.0 K/uL   Monocytes Relative 11 %   Monocytes Absolute 0.9 0.1 - 1.0 K/uL   Eosinophils Relative 2 %   Eosinophils Absolute 0.2 0.0 - 0.5 K/uL   Basophils Relative 1 %   Basophils Absolute 0.1 0.0 - 0.1 K/uL   Immature Granulocytes 0 %   Abs Immature Granulocytes 0.02 0.00 - 0.07 K/uL    Comment: Performed at [facility], [address] [place], [place] [address]  Comprehensive metabolic panel     Status: Abnormal   Collection Time: 10/24/18 11:34 PM  Result Value Ref Range   Sodium 139 135 - 145 mmol/L   Potassium 3.6 3.5 - 5.1 mmol/L   Chloride 105 98 - 111 mmol/L   CO2 24 22 - 32 mmol/L   Glucose, Bld 173 (H) 70 - 99 mg/dL   BUN 24 (H) 8 - 23 mg/dL   Creatinine, Ser 1.14 0.61 - 1.24 mg/dL   Calcium 9.0 8.9 - 10.3 mg/dL   Total Protein 7.3 6.5 - 8.1 g/dL   Albumin 4.2 3.5 - 5.0 g/dL   AST 14 (L) 15 - 41 U/L   ALT 10 0 - 44 U/L   Alkaline Phosphatase 85 38 - 126 U/L   Total Bilirubin 1.2 0.3 - 1.2 mg/dL   GFR calc non Af Amer >60 >60 mL/min   GFR calc Af Amer >60 >60 mL/min   Anion gap 10 5 - 15    Comment: Performed at [facility], [address] [place], [place] [address]  Lactic acid, plasma     Status: None   Collection Time: 10/24/18 11:35 PM  Result Value Ref Range   Lactic Acid, Venous 1.2 0.5 - 1.9 mmol/L    Comment: Performed at [facility], [address] [place], [place] [address]  Glucose, capillary     Status: Abnormal   Collection Time: 10/25/18  9:03 AM  Result Value Ref Range   Glucose-Capillary 116 (H) 70 - 99 mg/dL  Glucose, capillary     Status: Abnormal   Collection Time: 10/25/18 12:55 PM  Result Value Ref Range   Glucose-Capillary 114 (H) 70 - 99 mg/dL  Glucose, capillary     Status: Abnormal   Collection Time: 10/25/18  2:10 PM  Result Value Ref Range   Glucose-Capillary 126 (H) 70 - 99 mg/dL  Glucose, capillary     Status: Abnormal   Collection Time: 10/25/18  3:38 PM  Result Value Ref Range   Glucose-Capillary 182 (H) 70 - 99 mg/dL  Glucose, capillary     Status: Abnormal   Collection Time: 10/25/18  5:59 PM  Result Value Ref Range   Glucose-Capillary 220 (H) 70 - 99 mg/dL  Glucose, capillary     Status: Abnormal   Collection Time: 10/25/18  9:36 PM  Result Value Ref Range   Glucose-Capillary 259 (H) 70 - 99 mg/dL   Comment 1 Notify RN   Glucose, capillary     Status: Abnormal   Collection Time: 10/26/18  7:53 AM  Result Value Ref Range   Glucose-Capillary 233 (H) 70 - 99 mg/dL  Glucose, capillary     Status: Abnormal   Collection Time: 10/26/18 11:46 AM  Result Value Ref Range   Glucose-Capillary 215 (H) 70 - 99 mg/dL  Glucose, capillary     Status: Abnormal   Collection Time: 10/26/18  5:17 PM  Result Value Ref Range   Glucose-Capillary 235 (H) 70 - 99 mg/dL  Glucose, capillary     Status: Abnormal   Collection Time: 10/26/18  9:26 PM  Result Value Ref Range   Glucose-Capillary 198 (H) 70 - 99 mg/dL  CBC     Status: Abnormal   Collection Time: 10/27/18  4:55 AM  Result Value Ref Range   WBC 9.1 4.0 - 10.5 K/uL   RBC 3.91 (L) 4.22 - 5.81 MIL/uL   Hemoglobin 11.8 (L) 13.0  - 17.0 g/dL   HCT 35.7 (L) 39.0 - 52.0 %   MCV 91.3 80.0 - 100.0 fL   MCH 30.2 26.0 - 34.0 pg   MCHC 33.1 30.0 - 36.0 g/dL   RDW 14.4 11.5 - 15.5 %   Platelets 203 150 - 400 K/uL   nRBC 0.0 0.0 - 0.2 %    Comment: Performed at [facility], [address] [place], [place] [address]  Basic metabolic panel     Status: Abnormal   Collection Time: 10/27/18  4:55 AM  Result Value Ref Range   Sodium 136 135 - 145 mmol/L   Potassium 3.8 3.5 - 5.1 mmol/L    Comment: HEMOLYSIS AT THIS LEVEL MAY AFFECT RESULT   Chloride 108 98 - 111 mmol/L   CO2 21 (L) 22 - 32 mmol/L   Glucose, Bld 171 (H) 70 - 99 mg/dL   BUN 28 (H) 8 - 23 mg/dL   Creatinine, Ser 1.08 0.61 - 1.24 mg/dL   Calcium 8.3 (L) 8.9 - 10.3 mg/dL   GFR calc non Af Amer >60 >60 mL/min   GFR calc Af Amer >60 >60 mL/min   Anion gap 7 5 - 15    Comment: Performed at [facility], [address] [place], [place] [address]  Magnesium     Status: None   Collection Time: 10/27/18  4:55 AM  Result Value Ref Range   Magnesium 2.4 1.7 - 2.4 mg/dL    Comment: Performed at  [facility], [place], [place] [address]  Glucose, capillary     Status: Abnormal   Collection Time: 10/27/18  7:53 AM  Result Value Ref Range   Glucose-Capillary 149 (H) 70 - 99 mg/dL   Comment 1 Notify RN   Glucose, capillary     Status: Abnormal   Collection Time: 10/27/18 11:40 AM  Result Value Ref Range   Glucose-Capillary 181 (H) 70 - 99 mg/dL   Comment 1 Notify RN   Surgical pathology     Status: None   Collection Time: 10/27/18  6:07 PM  Result Value Ref Range   SURGICAL PATHOLOGY      Surgical Pathology CASE: [id number] PATIENT: [name] Surgical Pathology Report     SPECIMEN SUBMITTED: A. Leg, right; below knee amputation  (previous right ankle amputation)  CLINICAL HISTORY: None provided  PRE-OPERATIVE DIAGNOSIS: Necrotic eschar right distal leg stump  POST-OPERATIVE DIAGNOSIS: Same  as pre op     DIAGNOSIS: A. LOWER LEG, RIGHT; BELOW-THE-KNEE AMPUTATION: - PREVIOUS AMPUTATION AT RIGHT ANKLE. - ATHEROSCLEROSIS. - CUTANEOUS NECROSIS, ULCERATION, AND CELLULITIS OF DISTAL STUMP. - TIBIAL INTRAMEDULLARY ROD.   GROSS DESCRIPTION: A. Labeled: Right BKA (previous right ankle amputation) Received: Fresh, wrapped in red-biohazard bag Size: 27.5 cm from proximal skin resection margin to previous resection site and ranging from 5.3 cm up to 9.0 cm in width.  Extending above the proximal soft tissue resection margin are 2 segments of bone, tibia measuring 3.5 cm in length by 4.5 x 4.0 cm and fibula 6.7 cm in length by 1.2 x 1.0  cm. An intramedullary rod extends from the proximal tibial margin for a distance of 1.0 cm. Description of lesion(s): Right leg stump with viable proximal skin and underlying soft tissue and bone margins.  The vascular margin is patent. The previous amputation site is tan-gray, focally necrotic. Sectioning through the lesion reveals a necrotic soft tissue that possibly extends to the underlying bone.  The skin is tan-white.  The anterior and posterior vessels are patent.  Block summary: A1.  Proximal skin margin and underlying soft tissue A2.  Proximal bone margin (bone marrow) A3.  Vascular margin A4.  Lesion to underlying bone A5.  Anterior and posterior vessels (anterior inked blue)  Tissue decalcification: A1 and A4   Final Diagnosis performed by Bryan Lemma, MD.   Electronically signed 11/01/2018 6:14:04PM The electronic signature indicates that the named Attending Pathologist has evaluated the specimen  Technical component performed at [facility], [address] , [place], [place] [address] Lab: [phone] Dir: Rush Farmer, MD, MMM  Professional component performed at [facility], [facility], [address], [place], [place] [address] Lab: [phone] Dir: [name]. Rubinas, MD   Aerobic/Anaerobic Culture  (surgical/deep wound)     Status: None   Collection Time: 10/27/18  6:18 PM  Result Value Ref Range   Specimen Description      WOUND BONE MARROW RIGHT LEG Performed at [facility], [address] [place], [place] [address]    Special Requests PATIENT ON FOLLOWING ZOSYN,VANCOMYCIN    Gram Stain NO WBC SEEN NO ORGANISMS SEEN     Culture      No growth aerobically or anaerobically. Performed at [facility], [place] [address] [place], [place] [address]    Report Status 11/02/2018 FINAL   Aerobic/Anaerobic Culture (surgical/deep wound)     Status: None   Collection Time: 10/27/18  6:48 PM  Result Value Ref Range  Specimen Description      WOUND BONE MARROW RIGHT LEG 2 Performed at [facility], [address] [place], [place] [address]    Special Requests PATIENT ON FOLLOWING ZOSYN,VANCOMYCIN    Gram Stain NO WBC SEEN NO ORGANISMS SEEN     Culture      No growth aerobically or anaerobically. Performed at [facility], [place] [address], [place], [place] [address]    Report Status 11/02/2018 FINAL   Aerobic/Anaerobic Culture (surgical/deep wound)     Status: None   Collection Time: 10/27/18  7:56 PM  Result Value Ref Range   Specimen Description      WOUND RIGHT ANKLE STUMP 3 Performed at [facility], [address] [place], [place] [address]    Special Requests PATIENT ON FOLLOWING ZOSYN,VANCOMYCIN    Gram Stain      FEW WBC PRESENT, PREDOMINANTLY PMN FEW GRAM POSITIVE COCCI IN PAIRS    Culture      FEW ACTINOMYCES NEUII NO ANAEROBES ISOLATED Performed at [facility], [place] [address], [place], [place] [address]    Report Status 11/02/2018 FINAL   Aerobic/Anaerobic Culture (surgical/deep wound)     Status: None   Collection Time: 10/27/18  7:58 PM  Result Value Ref Range   Specimen Description      WOUND RIGHT ANKLE STUMP 4 Performed at [facility], [address] [place], [place] [address]    Special  Requests PATIENT ON FOLLOWING ZOSYN,VANCOMYCIN    Gram Stain      FEW WBC PRESENT, PREDOMINANTLY PMN FEW GRAM POSITIVE COCCI IN PAIRS    Culture      FEW ACTINOMYCES NEUII NO ANAEROBES ISOLATED Performed at [facility], [place] [address], [place], [place] [address]    Report Status 11/02/2018 FINAL   Glucose, capillary     Status: Abnormal   Collection Time: 10/27/18  8:13 PM  Result Value Ref Range   Glucose-Capillary 158 (H) 70 - 99 mg/dL  Glucose, capillary     Status: Abnormal   Collection Time: 10/27/18  9:29 PM  Result Value Ref Range   Glucose-Capillary 163 (H) 70 - 99 mg/dL  CBC     Status: Abnormal   Collection Time: 10/28/18  3:31 AM  Result Value Ref Range   WBC 7.7 4.0 - 10.5 K/uL   RBC 3.81 (L) 4.22 - 5.81 MIL/uL   Hemoglobin 11.6 (L) 13.0 - 17.0 g/dL   HCT 35.1 (L) 39.0 - 52.0 %   MCV 92.1 80.0 - 100.0 fL   MCH 30.4 26.0 - 34.0 pg   MCHC 33.0 30.0 - 36.0 g/dL   RDW 13.9 11.5 - 15.5 %   Platelets 226 150 - 400 K/uL   nRBC 0.0 0.0 - 0.2 %    Comment: Performed at [facility], [address] [place], [place] [address]  Basic metabolic panel     Status: Abnormal   Collection Time: 10/28/18  3:31 AM  Result Value Ref Range   Sodium 138 135 - 145 mmol/L   Potassium 4.5 3.5 - 5.1 mmol/L   Chloride 107 98 - 111 mmol/L   CO2 25 22 - 32 mmol/L   Glucose, Bld 243 (H) 70 - 99 mg/dL   BUN 19 8 - 23 mg/dL   Creatinine, Ser 0.85 0.61 - 1.24 mg/dL   Calcium 8.2 (L) 8.9 - 10.3 mg/dL   GFR calc non Af Amer >60 >60 mL/min   GFR  calc Af Amer >60 >60 mL/min   Anion gap 6 5 - 15    Comment: Performed at [facility], [address] [place], [place] [address]  Glucose, capillary     Status: Abnormal   Collection Time: 10/28/18  8:06 AM  Result Value Ref Range   Glucose-Capillary 158 (H) 70 - 99 mg/dL   Comment 1 Notify RN   Glucose, capillary     Status: Abnormal   Collection Time: 10/28/18 11:45 AM  Result Value Ref Range   Glucose-Capillary  240 (H) 70 - 99 mg/dL   Comment 1 Notify RN   Glucose, capillary     Status: Abnormal   Collection Time: 10/28/18  4:39 PM  Result Value Ref Range   Glucose-Capillary 218 (H) 70 - 99 mg/dL   Comment 1 Notify RN   Glucose, capillary     Status: Abnormal   Collection Time: 10/28/18  8:59 PM  Result Value Ref Range   Glucose-Capillary 182 (H) 70 - 99 mg/dL  Glucose, capillary     Status: Abnormal   Collection Time: 10/29/18  7:57 AM  Result Value Ref Range   Glucose-Capillary 154 (H) 70 - 99 mg/dL   Comment 1 Notify RN   Glucose, capillary     Status: Abnormal   Collection Time: 10/29/18 11:50 AM  Result Value Ref Range   Glucose-Capillary 174 (H) 70 - 99 mg/dL   Comment 1 Notify RN   Glucose, capillary     Status: Abnormal   Collection Time: 10/29/18  4:56 PM  Result Value Ref Range   Glucose-Capillary 171 (H) 70 - 99 mg/dL  Glucose, capillary     Status: Abnormal   Collection Time: 10/29/18  9:17 PM  Result Value Ref Range   Glucose-Capillary 159 (H) 70 - 99 mg/dL  Glucose, capillary     Status: Abnormal   Collection Time: 10/30/18  7:56 AM  Result Value Ref Range   Glucose-Capillary 190 (H) 70 - 99 mg/dL  Glucose, capillary     Status: Abnormal   Collection Time: 10/30/18 11:54 AM  Result Value Ref Range   Glucose-Capillary 121 (H) 70 - 99 mg/dL    Radiology Dg Tibia/fibula Right  Result Date: 10/24/2018 CLINICAL DATA:  Right foot amputation.  Pain at the stump. EXAM: RIGHT TIBIA AND FIBULA - 2 VIEW COMPARISON:  CT 06/14/2018 FINDINGS: Prior amputation of the right foot. Intramedullary nail within the tibia. Old healed midshaft tibia and fibular fractures. There is soft tissue gas in the stump soft tissues. No underlying bony changes to suggest osteomyelitis. IMPRESSION: Prior foot amputation. Gas within the soft tissues at the stump. No underlying radiographic changes to suggest osteomyelitis. Electronically Signed   By: Rolm Baptise M.D.   On: 10/24/2018 23:53   Dg C-arm  1-60 Min-no Report  Result Date: 10/27/2018 Fluoroscopy was utilized by the requesting physician.  No radiographic interpretation.    Assessment/Plan  Benign essential HTN blood pressure control important in reducing the progression of atherosclerotic disease. On appropriate oral medications.   Diabetes blood glucose control important in reducing the progression of atherosclerotic disease. Also, involved in wound healing. On appropriate medications.   HLD (hyperlipidemia) lipid control important in reducing the progression of atherosclerotic disease. Continue statin therapy   PAD (peripheral artery disease) ([place]) Doing well.  S/p right leg intervention but subsequent BKA with ulceration and infection. Check flow in about three months.  Hx of BKA, right ([place]) Healing well.  Return in 1-2  weeks to remove sutures.    Leotis Pain, MD  11/06/2018 10:12 AM    This note was created with Dragon medical transcription system.  Any errors from dictation are purely unintentional

## 2018-11-06 NOTE — Patient Instructions (Signed)
Peripheral Vascular Disease  Peripheral vascular disease (PVD) is a disease of the blood vessels that are not part of your heart and brain. A simple term for PVD is poor circulation. In most cases, PVD narrows the blood vessels that carry blood from your heart to the rest of your body. This can reduce the supply of blood to your arms, legs, and internal organs, like your stomach or kidneys. However, PVD most often affects a person's lower legs and feet. Without treatment, PVD tends to get worse. PVD can also lead to acute ischemic limb. This is when an arm or leg suddenly cannot get enough blood. This is a medical emergency. Follow these instructions at home: Lifestyle  Do not use any products that contain nicotine or tobacco, such as cigarettes and e-cigarettes. If you need help quitting, ask your doctor.  Lose weight if you are overweight. Or, stay at a healthy weight as told by your doctor.  Eat a diet that is low in fat and cholesterol. If you need help, ask your doctor.  Exercise regularly. Ask your doctor for activities that are right for you. General instructions  Take over-the-counter and prescription medicines only as told by your doctor.  Take good care of your feet: ? Wear comfortable shoes that fit well. ? Check your feet often for any cuts or sores.  Keep all follow-up visits as told by your doctor This is important. Contact a doctor if:  You have cramps in your legs when you walk.  You have leg pain when you are at rest.  You have coldness in a leg or foot.  Your skin changes.  You are unable to get or have an erection (erectile dysfunction).  You have cuts or sores on your feet that do not heal. Get help right away if:  Your arm or leg turns cold, numb, and blue.  Your arms or legs become red, warm, swollen, painful, or numb.  You have chest pain.  You have trouble breathing.  You suddenly have weakness in your face, arm, or leg.  You become very  confused or you cannot speak.  You suddenly have a very bad headache.  You suddenly cannot see. Summary  Peripheral vascular disease (PVD) is a disease of the blood vessels.  A simple term for PVD is poor circulation. Without treatment, PVD tends to get worse.  Treatment may include exercise, low fat and low cholesterol diet, and quitting smoking. This information is not intended to replace advice given to you by your health care provider. Make sure you discuss any questions you have with your health care provider. Document Released: 12/07/2009 Document Revised: 10/20/2016 Document Reviewed: 10/20/2016 Elsevier Interactive Patient Education  2019 Elsevier Inc.  

## 2018-11-06 NOTE — Progress Notes (Signed)
     Patient: Andrew Lyons Male    DOB: 05/24/1946   72 y.o.   MRN: [id number] Visit Date: 11/06/2018  Today's Provider: [name] Gilbert Jr, MD   Chief Complaint  Patient presents with  . Hospitalization Follow-up    hospitalized for gangrene 10/25/18 and d/c on 10/30/18   Subjective:     HPI   Follow up Hospitalization Transition of care visit Patient was admitted to [facility] on 10/25/2018 and discharged on 10/30/2018. He was treated for gangrene and cellulitis over the right stump . Treatment for this included amputation of the right stump above the knee . Telephone follow up was done on 10/31/2018. He reports good compliance with treatment. He reports this condition is Improved. He reports that he is now back on Plavix and he has completed all antibiotics.  He reports that he was seen by Dr. Wolfe today, and he was prescribed Myrbetriq.    Allergies  Allergen Reactions  . Iodinated Diagnostic Agents Rash    had a red chest -unusre of what kind of dye it was  . Zyban [Bupropion] Rash  . Zocor [Simvastatin] Other (See Comments)    Reaction: hepatitis   . Gadolinium Derivatives Rash     Current Outpatient Medications:  .  amLODipine-benazepril (LOTREL) 5-40 MG capsule, TAKE 1 CAPSULE BY MOUTH EVERY DAY (Patient taking differently: Take 1 capsule by mouth daily. ), Disp: 90 capsule, Rfl: 3 .  amoxicillin-clavulanate (AUGMENTIN) 875-125 MG tablet, Take 1 tablet by mouth every 12 (twelve) hours., Disp: 18 tablet, Rfl: 0 .  aspirin EC 81 MG tablet, Take 1 tablet (81 mg total) by mouth daily., Disp: 30 tablet, Rfl: 2 .  B-D UF III MINI PEN NEEDLES 31G X 5 MM MISC, USE WITH PEN DAILY, Disp: 100 each, Rfl: 3 .  Blood Glucose Monitoring Suppl (ONE TOUCH ULTRA SYSTEM KIT) w/Device KIT, To use daily to check sugar. DX E11.9-needs one touch ultra meter, Disp: 1 each, Rfl: 0 .  cholecalciferol (VITAMIN D) 25 MCG (1000 UT) tablet, Take 1,000 Units by mouth daily. , Disp: , Rfl:  .   clopidogrel (PLAVIX) 75 MG tablet, Take 1 tablet (75 mg total) by mouth daily., Disp: 30 tablet, Rfl: 6 .  Coenzyme Q10 100 MG TABS, Take 100 mg by mouth daily. , Disp: , Rfl:  .  fluticasone (FLONASE) 50 MCG/ACT nasal spray, USE 2 SPRAYS IN EACH NOSTRIL EVERY DAY (Patient taking differently: Place 2 sprays into both nostrils daily. ), Disp: 48 g, Rfl: 3 .  glucose blood (ONE TOUCH ULTRA TEST) test strip, Check sugar 3 times daily. DX E11.9-strips for one touch ultra meter, Disp: 100 each, Rfl: 11 .  JARDIANCE 25 MG TABS tablet, TAKE 1 TABLET BY MOUTH DAILY (Patient taking differently: Take 25 mg by mouth daily. ), Disp: 30 tablet, Rfl: 10 .  liraglutide (VICTOZA) 18 MG/3ML SOPN, INJECT 1.2 SUBCUTANEOUSLY EVERY DAY (Patient taking differently: Inject 1.2 mg into the skin daily. INJECT 1.2 SUBCUTANEOUSLY EVERY DAY), Disp: 9 pen, Rfl: 3 .  metFORMIN (GLUCOPHAGE) 500 MG tablet, TAKE 1 TABLET (500 MG TOTAL) BY MOUTH 2 (TWO) TIMES DAILY WITH A MEAL., Disp: 180 tablet, Rfl: 3 .  Meth-Hyo-M Bl-Na Phos-Ph Sal (URIBEL) 118 MG CAPS, Take 1 capsule (118 mg total) by mouth every 6 (six) hours as needed (dysuria)., Disp: 40 capsule, Rfl: 3 .  metoprolol succinate (TOPROL-XL) 100 MG 24 hr tablet, TAKE 1 TABLET BY MOUTH EVERY DAY (Patient taking differently: Take 100   mg by mouth daily. ), Disp: 90 tablet, Rfl: 3 .  montelukast (SINGULAIR) 10 MG tablet, Take 1 tablet (10 mg total) by mouth daily., Disp: 90 tablet, Rfl: 3 .  omeprazole (PRILOSEC) 40 MG capsule, TAKE ONE CAPSULE BY MOUTH EVERY DAY (Patient taking differently: Take 40 mg by mouth daily. ), Disp: 90 capsule, Rfl: 3 .  ONETOUCH DELICA LANCETS FINE MISC, 1 Device by Does not apply route 3 (three) times daily., Disp: 100 each, Rfl: 11 .  oxyCODONE (OXY IR/ROXICODONE) 5 MG immediate release tablet, Take 1 tablet (5 mg total) by mouth every 6 (six) hours as needed for moderate pain., Disp: 16 tablet, Rfl: 0 .  rosuvastatin (CRESTOR) 20 MG tablet, TAKE 1 TABLET  BY MOUTH EVERY DAY (Patient taking differently: Take 20 mg by mouth daily. ), Disp: 90 tablet, Rfl: 2 .  sildenafil (VIAGRA) 100 MG tablet, Take 100 mg by mouth as needed for erectile dysfunction. , Disp: , Rfl:  .  vitamin B-12 (CYANOCOBALAMIN) 1000 MCG tablet, Take 1,000 mcg by mouth daily., Disp: , Rfl:   Review of Systems  Constitutional: Negative.   HENT: Negative.   Eyes: Negative.   Respiratory: Negative.   Cardiovascular: Negative.   Gastrointestinal: Negative.   Endocrine: Negative.   Genitourinary: Negative.   Musculoskeletal: Negative.   Skin: Negative.   Allergic/Immunologic: Negative.   Neurological: Negative.   Hematological: Negative.   Psychiatric/Behavioral: Negative.     Social History   Tobacco Use  . Smoking status: Former Smoker    Last attempt to quit: 10/02/2001    Years since quitting: 17.1  . Smokeless tobacco: Former User  . Tobacco comment: Quit smoking in 2003; Started smoking at age 14, smoked about 40 years, smoked over 3 packs per day  Substance Use Topics  . Alcohol use: Not Currently      Objective:   BP 126/82   Temp 98.4 F (36.9 C)   Resp 16   Wt 237 lb (107.5 kg)   BMI 34.01 kg/m  Vitals:   11/06/18 1534  BP: 126/82  Resp: 16  Temp: 98.4 F (36.9 C)  Weight: 237 lb (107.5 kg)     Physical Exam Constitutional:      Appearance: He is well-developed.  HENT:     Head: Normocephalic and atraumatic.     Right Ear: External ear normal.     Left Ear: External ear normal.     Nose: Nose normal.  Eyes:     General: No scleral icterus.    Conjunctiva/sclera: Conjunctivae normal.  Neck:     Thyroid: No thyromegaly.  Cardiovascular:     Rate and Rhythm: Normal rate and regular rhythm.     Heart sounds: Normal heart sounds.  Pulmonary:     Effort: Pulmonary effort is normal.     Breath sounds: Normal breath sounds.  Abdominal:     Palpations: Abdomen is soft.  Musculoskeletal:     Comments: S/p Right BKA.  Skin:     General: Skin is warm and dry.  Neurological:     General: No focal deficit present.     Mental Status: He is alert and oriented to person, place, and time.  Psychiatric:        Mood and Affect: Mood normal.        Behavior: Behavior normal.        Thought Content: Thought content normal.        Judgment: Judgment normal.           Assessment & Plan    1. Amputation stump infection ([place]) Much improved..  Followed by surgery.  2. Type 2 diabetes mellitus with complication, without long-term current use of insulin (H[facility])  3. Atherosclerosis of native coronary artery of native heart without angina pectoris   4. Benign essential HTN   5. Type 2 diabetes mellitus with other circulatory complication, without long-term current use of insulin ([place]) Patient has severe urinary frequency due to Jardiance.  He wishes to remain on this to control his sugar.  Will stop it for 1 week and see how he does.  6. Barrett's esophagus with dysplasia   7. Class 2 severe obesity due to excess calories with serious comorbidity and body mass index (BMI) of 38.0 to 38.9 in adult [facility]) With diabetes, OSA, hypertension, CAD. 8.OSA Patient needs new sleep study.  On CPAP and it helps him.   I have done the exam and reviewed the above chart and it is accurate to the best of my knowledge. Development worker, community has been used in this note in any air is in the dictation or transcription are unintentional.  [name], MD  [place]

## 2018-11-06 NOTE — Assessment & Plan Note (Signed)
lipid control important in reducing the progression of atherosclerotic disease. Continue statin therapy  

## 2018-11-06 NOTE — Assessment & Plan Note (Signed)
blood glucose control important in reducing the progression of atherosclerotic disease. Also, involved in wound healing. On appropriate medications.  

## 2018-11-06 NOTE — Assessment & Plan Note (Signed)
Healing well.  Return in 1-2 weeks to remove sutures.

## 2018-11-06 NOTE — Patient Instructions (Signed)
Stop Jardiance.  Increase Victoza to 1.8mg  daily.

## 2018-11-07 ENCOUNTER — Telehealth: Payer: Self-pay | Admitting: Family Medicine

## 2018-11-07 NOTE — Telephone Encounter (Signed)
Esther with [facility] call to say pt declined OT eval  Thanks teri

## 2018-11-09 ENCOUNTER — Other Ambulatory Visit: Payer: Self-pay | Admitting: Family Medicine

## 2018-11-09 ENCOUNTER — Telehealth: Payer: Self-pay | Admitting: Family Medicine

## 2018-11-09 DIAGNOSIS — E118 Type 2 diabetes mellitus with unspecified complications: Secondary | ICD-10-CM

## 2018-11-09 NOTE — Telephone Encounter (Signed)
Elder Cyphers, PT w/ [facility] [phone]  Needing verbal approval for: In home PT 2 times a week for 2 weeks.  Please advise.  Thanks, [employer]

## 2018-11-12 ENCOUNTER — Telehealth: Payer: Self-pay | Admitting: Family Medicine

## 2018-11-12 MED ORDER — AMOXICILLIN-POT CLAVULANATE 875-125 MG PO TABS: 1.0000 | ORAL_TABLET | Freq: Two times a day (BID) | ORAL | 0 refills | Status: DC

## 2018-11-12 NOTE — Telephone Encounter (Signed)
Pt's wife called saying she thinks he has a UTI from the cat he had in hospital.  She said it burns when he urinates.  He was just in on the 11th and seen Dr. Rosanna Randy.  She wants to know if he can have an antibiotic.  They do not want him to come in.  CB#  [phone]  [facility]  Thanks teri

## 2018-11-12 NOTE — Telephone Encounter (Signed)
Patient wife advised and verbally voiced understanding. Prescription sent into pharmacy.

## 2018-11-12 NOTE — Telephone Encounter (Signed)
Verbal orders given  

## 2018-11-12 NOTE — Telephone Encounter (Signed)
Please advise 

## 2018-11-12 NOTE — Telephone Encounter (Signed)
Please review. Thanks!  

## 2018-11-12 NOTE — Telephone Encounter (Signed)
Augmentin 875 mg BID #14 (generic). Remind patient he is scheduled to see Dr. Rosanna Randy at 2:40 pm on 11-14-18 for follow up of stump infection. This situation with urine burning, probably will need a culture to be sure these antibiotics clears it up.

## 2018-11-12 NOTE — Telephone Encounter (Signed)
Please advise...  Thanks [employer]

## 2018-11-14 ENCOUNTER — Ambulatory Visit (INDEPENDENT_AMBULATORY_CARE_PROVIDER_SITE_OTHER): Payer: PPO | Admitting: Family Medicine

## 2018-11-14 ENCOUNTER — Encounter: Payer: Self-pay | Admitting: Family Medicine

## 2018-11-14 VITALS — BP 126/70 | HR 80 | Temp 98.0°F | Resp 16

## 2018-11-14 DIAGNOSIS — T8740 Infection of amputation stump, unspecified extremity: Secondary | ICD-10-CM

## 2018-11-14 DIAGNOSIS — N390 Urinary tract infection, site not specified: Secondary | ICD-10-CM | POA: Diagnosis not present

## 2018-11-14 NOTE — Progress Notes (Signed)
Patient: Andrew Lyons Male    DOB: 1946-04-12   73 y.o.   MRN: [id number] Visit Date: 11/14/2018  Today's Provider: [name], MD   Chief Complaint  Patient presents with  . Follow-up   Subjective:     HPI  Patient comes in today for a follow up. He was last seen in the office 1 week ago. He was advised to hold Jardiance for 1 week to see if his urinary frequency would improve. Patient reports that he is still having symptoms.   He also mentions that amputation stump infection is continuing to improve.   Allergies  Allergen Reactions  . Iodinated Diagnostic Agents Rash    had a red chest -unusre of what kind of dye it was  . Zyban [Bupropion] Rash  . Zocor [Simvastatin] Other (See Comments)    Reaction: hepatitis   . Gadolinium Derivatives Rash     Current Outpatient Medications:  .  amLODipine-benazepril (LOTREL) 5-40 MG capsule, TAKE 1 CAPSULE BY MOUTH EVERY DAY (Patient taking differently: Take 1 capsule by mouth daily. ), Disp: 90 capsule, Rfl: 3 .  aspirin EC 81 MG tablet, Take 1 tablet (81 mg total) by mouth daily., Disp: 30 tablet, Rfl: 2 .  B-D UF III MINI PEN NEEDLES 31G X 5 MM MISC, USE WITH PEN DAILY, Disp: 100 each, Rfl: 3 .  Blood Glucose Monitoring Suppl (ONE TOUCH ULTRA SYSTEM KIT) w/Device KIT, To use daily to check sugar. DX E11.9-needs one touch ultra meter, Disp: 1 each, Rfl: 0 .  cholecalciferol (VITAMIN D) 25 MCG (1000 UT) tablet, Take 1,000 Units by mouth daily. , Disp: , Rfl:  .  clopidogrel (PLAVIX) 75 MG tablet, Take 1 tablet (75 mg total) by mouth daily., Disp: 30 tablet, Rfl: 6 .  Coenzyme Q10 100 MG TABS, Take 100 mg by mouth daily. , Disp: , Rfl:  .  fluticasone (FLONASE) 50 MCG/ACT nasal spray, USE 2 SPRAYS IN EACH NOSTRIL EVERY DAY (Patient taking differently: Place 2 sprays into both nostrils daily. ), Disp: 48 g, Rfl: 3 .  glucose blood (ONE TOUCH ULTRA TEST) test strip, Check sugar 3 times daily. DX E11.9-strips for one  touch ultra meter, Disp: 100 each, Rfl: 11 .  liraglutide (VICTOZA) 18 MG/3ML SOPN, INJECT 1.2 SUBCUTANEOUSLY EVERY DAY (Patient taking differently: Inject 1.2 mg into the skin daily. INJECT 1.2 SUBCUTANEOUSLY EVERY DAY), Disp: 9 pen, Rfl: 3 .  metFORMIN (GLUCOPHAGE) 500 MG tablet, TAKE 1 TABLET (500 MG TOTAL) BY MOUTH 2 (TWO) TIMES DAILY WITH A MEAL., Disp: 180 tablet, Rfl: 3 .  Meth-Hyo-M Bl-Na Phos-Ph Sal (URIBEL) 118 MG CAPS, Take 1 capsule (118 mg total) by mouth every 6 (six) hours as needed (dysuria)., Disp: 40 capsule, Rfl: 3 .  metoprolol succinate (TOPROL-XL) 100 MG 24 hr tablet, TAKE 1 TABLET BY MOUTH EVERY DAY (Patient taking differently: Take 100 mg by mouth daily. ), Disp: 90 tablet, Rfl: 3 .  montelukast (SINGULAIR) 10 MG tablet, Take 1 tablet (10 mg total) by mouth daily., Disp: 90 tablet, Rfl: 3 .  omeprazole (PRILOSEC) 40 MG capsule, TAKE ONE CAPSULE BY MOUTH EVERY DAY (Patient taking differently: Take 40 mg by mouth daily. ), Disp: 90 capsule, Rfl: 3 .  ONETOUCH DELICA LANCETS FINE MISC, 1 Device by Does not apply route 3 (three) times daily., Disp: 100 each, Rfl: 11 .  oxyCODONE (OXY IR/ROXICODONE) 5 MG immediate release tablet, Take 1 tablet (5 mg total) by  mouth every 6 (six) hours as needed for moderate pain., Disp: 16 tablet, Rfl: 0 .  rosuvastatin (CRESTOR) 20 MG tablet, TAKE 1 TABLET BY MOUTH EVERY DAY (Patient taking differently: Take 20 mg by mouth daily. ), Disp: 90 tablet, Rfl: 2 .  sildenafil (VIAGRA) 100 MG tablet, Take 100 mg by mouth as needed for erectile dysfunction. , Disp: , Rfl:  .  vitamin B-12 (CYANOCOBALAMIN) 1000 MCG tablet, Take 1,000 mcg by mouth daily., Disp: , Rfl:  .  amoxicillin-clavulanate (AUGMENTIN) 875-125 MG tablet, Take 1 tablet by mouth 2 (two) times daily. (Patient not taking: Reported on 11/14/2018), Disp: 14 tablet, Rfl: 0 .  JARDIANCE 25 MG TABS tablet, TAKE 1 TABLET BY MOUTH DAILY (Patient not taking: No sig reported), Disp: 30 tablet, Rfl:  10  Review of Systems  Constitutional: Negative for activity change, chills, diaphoresis and fatigue.  HENT: Negative.   Eyes: Negative.   Respiratory: Negative.  Negative for cough and shortness of breath.   Cardiovascular: Negative for chest pain and leg swelling.  Gastrointestinal: Negative.   Endocrine: Negative.   Genitourinary: Positive for frequency. Negative for dysuria, hematuria and urgency.  Skin: Positive for color change. Negative for pallor, rash and wound.  Allergic/Immunologic: Negative.   Hematological: Negative.   Psychiatric/Behavioral: Negative.     Social History   Tobacco Use  . Smoking status: Former Smoker    Last attempt to quit: 10/02/2001    Years since quitting: 17.1  . Smokeless tobacco: Former Systems developer  . Tobacco comment: Quit smoking in 2003; Started smoking at age 45, smoked about 40 years, smoked over 3 packs per day  Substance Use Topics  . Alcohol use: Not Currently      Objective:   BP 126/70 (BP Location: Left Arm, Patient Position: Sitting, Cuff Size: Large)   Pulse 80   Temp 98 F (36.7 C)   Resp 16  Vitals:   11/14/18 1453  BP: 126/70  Pulse: 80  Resp: 16  Temp: 98 F (36.7 C)     Physical Exam Vitals signs reviewed.  Constitutional:      Appearance: He is well-developed.  HENT:     Head: Normocephalic and atraumatic.     Right Ear: External ear normal.     Left Ear: External ear normal.     Nose: Nose normal.  Eyes:     General: No scleral icterus.    Conjunctiva/sclera: Conjunctivae normal.  Neck:     Thyroid: No thyromegaly.  Cardiovascular:     Rate and Rhythm: Normal rate and regular rhythm.     Heart sounds: Normal heart sounds.  Pulmonary:     Effort: Pulmonary effort is normal.     Breath sounds: Normal breath sounds.  Abdominal:     Palpations: Abdomen is soft.  Musculoskeletal:     Comments: S/p Right BKA.  Is healing nicely.  No signs of secondary infection. No  Signs of recurrent or ongoing gangrene.   Skin:    General: Skin is warm and dry.  Neurological:     General: No focal deficit present.     Mental Status: He is alert and oriented to person, place, and time. Mental status is at baseline.  Psychiatric:        Mood and Affect: Mood normal.        Behavior: Behavior normal.        Thought Content: Thought content normal.        Judgment: Judgment normal.  Assessment & Plan    1. Amputation stump infection ([place]) All signs of gangrene have resolved.  Stump looks good.  He will follow-up with surgery next week.  2. Urinary tract infection without hematuria, site unspecified Do not think this is infection.  Repeat urine for culture.  It is not better without the Jardiance.  Restart Jardiance for his diabetes. - Urine Culture     [name], MD  [facility]

## 2018-11-17 LAB — URINE CULTURE: Organism ID, Bacteria: NO GROWTH

## 2018-11-20 ENCOUNTER — Encounter (INDEPENDENT_AMBULATORY_CARE_PROVIDER_SITE_OTHER): Payer: Self-pay | Admitting: Vascular Surgery

## 2018-11-20 ENCOUNTER — Ambulatory Visit (INDEPENDENT_AMBULATORY_CARE_PROVIDER_SITE_OTHER): Payer: PPO | Admitting: Vascular Surgery

## 2018-11-20 VITALS — BP 131/67 | HR 81 | Resp 16

## 2018-11-20 DIAGNOSIS — I739 Peripheral vascular disease, unspecified: Secondary | ICD-10-CM

## 2018-11-20 DIAGNOSIS — Z87891 Personal history of nicotine dependence: Secondary | ICD-10-CM

## 2018-11-20 DIAGNOSIS — Z89511 Acquired absence of right leg below knee: Secondary | ICD-10-CM

## 2018-11-20 NOTE — Progress Notes (Signed)
Subjective:    Patient ID: Andrew Lyons, male    DOB: [birth year], 73 y.o.   MRN: [id number] Chief Complaint  Patient presents with  . Follow-up    2week suture removal    Patient presents for his first postoperative follow-up.  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  Patient notes his postoperative course has been unremarkable.  Patient states his pain is remarkably improved.  Denies any issues with his stump incision.  States that his stump is healthy.  He presents today for his first set of suture removal.  Patient denies any fever, nausea vomiting.  Review of Systems  Constitutional: Negative.   HENT: Negative.   Eyes: Negative.   Respiratory: Negative.   Cardiovascular: Negative.        Right BKA  Gastrointestinal: Negative.   Endocrine: Negative.   Genitourinary: Negative.   Musculoskeletal: Negative.   Skin: Negative.   Allergic/Immunologic: Negative.   Neurological: Negative.   Hematological: Negative.   Psychiatric/Behavioral: Negative.       Objective:   Physical Exam Vitals signs reviewed.  Constitutional:      Appearance: Normal appearance. He is normal weight.  HENT:     Head: Normocephalic and atraumatic.     Right Ear: External ear normal.     Left Ear: External ear normal.     Nose: Nose normal.     Mouth/Throat:     Mouth: Mucous membranes are moist.     Pharynx: Oropharynx is clear.  Eyes:     Extraocular Movements: Extraocular movements intact.     Conjunctiva/sclera: Conjunctivae normal.     Pupils: Pupils are equal, round, and reactive to light.  Neck:     Musculoskeletal: Normal range of motion.  Cardiovascular:     Rate and Rhythm: Normal rate and regular rhythm.  Pulmonary:     Effort: Pulmonary effort is normal.     Breath sounds: Normal breath sounds.  Musculoskeletal: Normal range of motion.  Skin:    General: Skin is warm and dry.      Comments: The patient stump is healing well.  The sutures are clean dry and intact.  There is no surrounding erythema.  Skin is healthy.  Thigh is soft.  Stump is warm and healthy.  Neurological:     General: No focal deficit present.     Mental Status: He is alert and oriented to person, place, and time.  Psychiatric:        Mood and Affect: Mood normal.        Behavior: Behavior normal.        Thought Content: Thought content normal.        Judgment: Judgment normal.    BP 131/67 (BP Location: Left Arm)   Pulse 81   Resp 16   Past Medical History:  Diagnosis Date  . 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 Barrett's esophagus with dysplasia   . COPD (chronic obstructive pulmonary disease) ([place])   . Coronary artery disease   . Diabetes mellitus without complication ([place])   . GERD (gastroesophageal reflux disease)   . Hepatitis   . Hyperlipidemia   . Hypertension   . Myocardial infarction ([place])   . Seizures ([place])   . Sleep apnea    Social History   Socioeconomic History  . Marital status: Married    Spouse name: Not on  file  . Number of children: 2  . Years of education: Not on file  . Highest education level: 8th grade  Occupational History  . Occupation: retired  Scientific laboratory technician  . Financial resource strain: Not hard at all  . Food insecurity:    Worry: Never true    Inability: Never true  . Transportation needs:    Medical: No    Non-medical: No  Tobacco Use  . Smoking status: Former Smoker    Last attempt to quit: 10/02/2001    Years since quitting: 17.1  . Smokeless tobacco: Former Systems developer  . Tobacco comment: Quit smoking in 2003; Started smoking at age 63, smoked about 40 years, smoked over 3 packs per day  Substance and Sexual Activity  . Alcohol use: Not Currently  . Drug use: No  . Sexual activity: Not on file  Lifestyle  . Physical activity:    Days per week: 0 days    Minutes per session: 0 min  . Stress: Not at all  Relationships  .  Social connections:    Talks on phone: Patient refused    Gets together: Patient refused    Attends religious service: Patient refused    Active member of club or organization: Patient refused    Attends meetings of clubs or organizations: Patient refused    Relationship status: Patient refused  . Intimate partner violence:    Fear of current or ex partner: Not on file    Emotionally abused: Not on file    Physically abused: Not on file    Forced sexual activity: Not on file  Other Topics Concern  . Not on file  Social History Narrative  . Not on file   Past Surgical History:  Procedure Laterality Date  . ABDOMINAL AORTOGRAM W/LOWER EXTREMITY N/A 06/20/2018   Procedure: ABDOMINAL AORTOGRAM W/LOWER EXTREMITY;  Surgeon: Wellington Hampshire, MD;  Location: [facility];  Service: Cardiovascular;  Laterality: N/A;  . AMPUTATION Right 10/27/2018   Procedure: AMPUTATION BELOW KNEE;  Surgeon: Leim Fabry, MD;  Location: [facility];  Service: Orthopedics;  Laterality: Right;  . CARDIAC CATHETERIZATION     with Angioplasty  . cardiac stents    . CARDIAC VALVE REPLACEMENT     Aortic Valve Replacement  . COLONOSCOPY WITH PROPOFOL N/A 03/18/2015   Procedure: COLONOSCOPY WITH PROPOFOL;  Surgeon: [name], MD;  Location[facility];  Service: Endoscopy;  Laterality: N/A;  . ESOPHAGOGASTRODUODENOSCOPY N/A 03/18/2015   Procedure: ESOPHAGOGASTRODUODENOSCOPY (EGD);  Surgeon: [name], MD;  Location: [facility] END[facility]COPY;  Service: Endoscopy;  Laterality: N/A;  . FOOT AMPUTATION    . GREEN LIGHT LASER TURP (TRANSURETHRAL RESECTION OF PROSTATE N/A 10/16/2018   Procedure: GREEN LIGHT LASER TURP (TRANSURETHRAL RESECTION OF PROSTATE;  Surgeon: Royston Cowper, MD;  Location: [facility];  Service: Urology;  Laterality: N/A;  . HARDWARE REMOVAL Right 10/27/2018   Procedure: HARDWARE REMOVAL;  Surgeon: Leim Fabry, MD;  Location: [facility];  Service: Orthopedics;  Laterality: Right;  . LOWER  EXTREMITY ANGIOGRAPHY Right 10/25/2018   Procedure: Lower Extremity Angiography;  Surgeon: Algernon Huxley, MD;  Location: [facility];  Service: Cardiovascular;  Laterality: Right;  . PERIPHERAL VASCULAR INTERVENTION Right 06/20/2018   Procedure: PERIPHERAL VASCULAR INTERVENTION;  Surgeon: Wellington Hampshire, MD;  Location: [facility];  Service: Cardiovascular;  Laterality: Right;  . THORACOTOMY Right   . TONSILLECTOMY     Family History  Problem Relation Age of Onset  .  Obesity Son   . Diabetes Brother   . Hypertension Brother   . Heart attack Mother   . Heart attack Father    Allergies  Allergen Reactions  . Iodinated Diagnostic Agents Rash    had a red chest -unusre of what kind of dye it was  . Zyban [Bupropion] Rash  . Zocor [Simvastatin] Other (See Comments)    Reaction: hepatitis   . Gadolinium Derivatives Rash      Assessment & Plan:   Patient presents for his first postoperative follow-up.  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  Patient notes his postoperative course has been unremarkable.  Patient states his pain is remarkably improved.  Denies any issues with his stump incision.  States that his stump is healthy.  He presents today for his first set of suture removal.  Patient denies any fever, nausea vomiting.  1. PAD (peripheral artery disease) ([place]) - Stable Patient presents for his first postop suture removal His stump is healing remarkably well and I will remove every other suture. Patient is to follow-up in 1 week for the rest of the sutures to be removed At that point I will refer him to biotech for a shrinker sock and to start the prosthetic process. The patient and his wife who are present at this visit are in agreement   Current Outpatient Medications on File Prior to Visit  Medication Sig Dispense Refill  . amLODipine-benazepril (LOTREL) 5-40 MG  capsule TAKE 1 CAPSULE BY MOUTH EVERY DAY (Patient taking differently: Take 1 capsule by mouth daily. ) 90 capsule 3  . aspirin EC 81 MG tablet Take 1 tablet (81 mg total) by mouth daily. 30 tablet 2  . B-D UF III MINI PEN NEEDLES 31G X 5 MM MISC USE WITH PEN DAILY 100 each 3  . Blood Glucose Monitoring Suppl (ONE TOUCH ULTRA SYSTEM KIT) w/Device KIT To use daily to check sugar. DX E11.9-needs one touch ultra meter 1 each 0  . cholecalciferol (VITAMIN D) 25 MCG (1000 UT) tablet Take 1,000 Units by mouth daily.     . clopidogrel (PLAVIX) 75 MG tablet Take 1 tablet (75 mg total) by mouth daily. 30 tablet 6  . Coenzyme Q10 100 MG TABS Take 100 mg by mouth daily.     . fluticasone (FLONASE) 50 MCG/ACT nasal spray USE 2 SPRAYS IN EACH NOSTRIL EVERY DAY (Patient taking differently: Place 2 sprays into both nostrils daily. ) 48 g 3  . glucose blood (ONE TOUCH ULTRA TEST) test strip Check sugar 3 times daily. DX E11.9-strips for one touch ultra meter 100 each 11  . liraglutide (VICTOZA) 18 MG/3ML SOPN INJECT 1.2 SUBCUTANEOUSLY EVERY DAY (Patient taking differently: Inject 1.2 mg into the skin daily. INJECT 1.2 SUBCUTANEOUSLY EVERY DAY) 9 pen 3  . metFORMIN (GLUCOPHAGE) 500 MG tablet TAKE 1 TABLET (500 MG TOTAL) BY MOUTH 2 (TWO) TIMES DAILY WITH A MEAL. 180 tablet 3  . Meth-Hyo-M Bl-Na Phos-Ph Sal (URIBEL) 118 MG CAPS Take 1 capsule (118 mg total) by mouth every 6 (six) hours as needed (dysuria). 40 capsule 3  . metoprolol succinate (TOPROL-XL) 100 MG 24 hr tablet TAKE 1 TABLET BY MOUTH EVERY DAY (Patient taking differently: Take 100 mg by mouth daily. ) 90 tablet 3  . montelukast (SINGULAIR) 10 MG tablet Take 1 tablet (10 mg total) by mouth daily. 90 tablet 3  . omeprazole (  PRILOSEC) 40 MG capsule TAKE ONE CAPSULE BY MOUTH EVERY DAY (Patient taking differently: Take 40 mg by mouth daily. ) 90 capsule 3  . ONETOUCH DELICA LANCETS FINE MISC 1 Device by Does not apply route 3 (three) times daily. 100 each 11  .  oxyCODONE (OXY IR/ROXICODONE) 5 MG immediate release tablet Take 1 tablet (5 mg total) by mouth every 6 (six) hours as needed for moderate pain. 16 tablet 0  . rosuvastatin (CRESTOR) 20 MG tablet TAKE 1 TABLET BY MOUTH EVERY DAY (Patient taking differently: Take 20 mg by mouth daily. ) 90 tablet 2  . sildenafil (VIAGRA) 100 MG tablet Take 100 mg by mouth as needed for erectile dysfunction.     . vitamin B-12 (CYANOCOBALAMIN) 1000 MCG tablet Take 1,000 mcg by mouth daily.    Marland Kitchen amoxicillin-clavulanate (AUGMENTIN) 875-125 MG tablet Take 1 tablet by mouth 2 (two) times daily. (Patient not taking: Reported on 11/14/2018) 14 tablet 0  . JARDIANCE 25 MG TABS tablet TAKE 1 TABLET BY MOUTH DAILY (Patient not taking: No sig reported) 30 tablet 10   No current facility-administered medications on file prior to visit.     There are no Patient Instructions on file for this visit. No follow-ups on file.   Rhyanna Sorce A Denetria Luevanos, PA-C

## 2018-11-26 ENCOUNTER — Ambulatory Visit: Payer: Self-pay | Admitting: Pharmacist

## 2018-11-26 DIAGNOSIS — E1159 Type 2 diabetes mellitus with other circulatory complications: Secondary | ICD-10-CM

## 2018-11-26 DIAGNOSIS — T8740 Infection of amputation stump, unspecified extremity: Secondary | ICD-10-CM

## 2018-11-26 NOTE — Chronic Care Management (AMB) (Signed)
  Chronic Care Management   Telephone Outreach Note  11/26/2018 Name: BROXTON BROADY MRN: [id number] DOB: [birth year]  Referred by: patient's health plan. ([employer]) Reason for referral : Chronic Care Management (Post discharge medication review)    I reached out to Mr. MIGUELANGEL KORN today by phone in response to a referral sent by Mr. Fabrizio Filip Lun's health plan for a 30 day post discharge medication review.   Mr. Granja was given information about Chronic Care Management services today including:  1. CCM service includes personalized support from designated clinical staff supervised by his physician, including individualized plan of care and coordination with other care providers 2. 24/7 contact phone numbers for assistance for urgent and routine care needs. 3. Service will only be billed when office clinical staff spend 20 minutes or more in a month to coordinate care. 4. Only one practitioner may furnish and bill the service in a calendar month. 5. The patient may stop CCM services at any time (effective at the end of the month) by phone call to the office staff. 6. The patient will be responsible for cost sharing (co-pay) of up to 20% of the service fee (after annual deductible is met).  Patient did not agree to services and does not wish to consider at this time.  The patient has been provided with contact information for the chronic care management team and has been advised to call with any health related questions or concerns.    [name]., MD has been notified of this outreach and Mr. Boden Stucky Vandeberg's decision and plan.   Ruben Reason, PharmD Clinical Pharmacist [place] [phone]

## 2018-11-26 NOTE — Patient Instructions (Signed)
Mr. Petersen was given information about Chronic Care Management services today including:  1. CCM service includes personalized support from designated clinical staff supervised by his physician, including individualized plan of care and coordination with other care providers 2. 24/7 contact phone numbers for assistance for urgent and routine care needs. 3. Service will only be billed when office clinical staff spend 20 minutes or more in a month to coordinate care. 4. Only one practitioner may furnish and bill the service in a calendar month. 5. The patient may stop CCM services at any time (effective at the end of the month) by phone call to the office staff. 6. The patient will be responsible for cost sharing (co-pay) of up to 20% of the service fee (after annual deductible is met).  Patient did not agree to services and does not wish to consider at this time.

## 2018-11-27 ENCOUNTER — Other Ambulatory Visit: Payer: Self-pay

## 2018-11-27 ENCOUNTER — Ambulatory Visit (INDEPENDENT_AMBULATORY_CARE_PROVIDER_SITE_OTHER): Payer: PPO | Admitting: Nurse Practitioner

## 2018-11-27 ENCOUNTER — Encounter (INDEPENDENT_AMBULATORY_CARE_PROVIDER_SITE_OTHER): Payer: Self-pay | Admitting: Nurse Practitioner

## 2018-11-27 VITALS — BP 144/67 | HR 83 | Resp 10 | Ht 70.0 in | Wt 232.0 lb

## 2018-11-27 DIAGNOSIS — Z87891 Personal history of nicotine dependence: Secondary | ICD-10-CM

## 2018-11-27 DIAGNOSIS — Z79899 Other long term (current) drug therapy: Secondary | ICD-10-CM

## 2018-11-27 DIAGNOSIS — I10 Essential (primary) hypertension: Secondary | ICD-10-CM

## 2018-11-27 DIAGNOSIS — Z791 Long term (current) use of non-steroidal anti-inflammatories (NSAID): Secondary | ICD-10-CM

## 2018-11-27 DIAGNOSIS — Z89511 Acquired absence of right leg below knee: Secondary | ICD-10-CM

## 2018-11-27 DIAGNOSIS — M1990 Unspecified osteoarthritis, unspecified site: Secondary | ICD-10-CM

## 2018-11-27 NOTE — Progress Notes (Signed)
SUBJECTIVE:  Patient ID: Andrew Lyons, male    DOB: 16-Jul-1946, 73 y.o.   MRN: [id number] Chief Complaint  Patient presents with  . Follow-up    suture removal    HPI  Andrew Lyons is a 73 y.o. male that presents today for suture removal from his recent right below-knee amputation revision.  The patient tolerated the procedure well.  Stump appears to be healing well.  He denies any fever, chills, nausea, vomiting.  He denies any drainage from the wound.  The wound is well approximated with small scabs and areas, but looks to be healing well.  Past Medical History:  Diagnosis Date  . 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 Barrett's esophagus with dysplasia   . COPD (chronic obstructive pulmonary disease) ([place])   . Coronary artery disease   . Diabetes mellitus without complication ([place])   . GERD (gastroesophageal reflux disease)   . Hepatitis   . Hyperlipidemia   . Hypertension   . Myocardial infarction ([place])   . Seizures ([place])   . Sleep apnea     Past Surgical History:  Procedure Laterality Date  . ABDOMINAL AORTOGRAM W/LOWER EXTREMITY N/A 06/20/2018   Procedure: ABDOMINAL AORTOGRAM W/LOWER EXTREMITY;  Surgeon: Wellington Hampshire, MD;  Location: [place] [facility];  Service: Cardiovascular;  Laterality: N/A;  . AMPUTATION Right 10/27/2018   Procedure: AMPUTATION BELOW KNEE;  Surgeon: Leim Fabry, MD;  Location: [facility];  Service: Orthopedics;  Laterality: Right;  . CARDIAC CATHETERIZATION     with Angioplasty  . cardiac stents    . CARDIAC VALVE REPLACEMENT     Aortic Valve Replacement  . COLONOSCOPY WITH PROPOFOL N/A 03/18/2015   Procedure: COLONOSCOPY WITH PROPOFOL;  Surgeon: [name], MD;  Location: [facility] ENDOSCOPY;  Service: Endoscopy;  Laterality: N/A;  . ESOPHAGOGASTRODUODENOSCOPY N/A 03/18/2015   Procedure: ESOPHAGOGASTRODUODENOSCOPY (EGD);  Surgeon: [name], MD;  Location: [facility] ENDOSCOPY;  Service: Endoscopy;  Laterality: N/A;  .  FOOT AMPUTATION    . GREEN LIGHT LASER TURP (TRANSURETHRAL RESECTION OF PROSTATE N/A 10/16/2018   Procedure: GREEN LIGHT LASER TURP (TRANSURETHRAL RESECTION OF PROSTATE;  Surgeon: Royston Cowper, MD;  Location: [facility] ORS;  Service: Urology;  Laterality: N/A;  . HARDWARE REMOVAL Right 10/27/2018   Procedure: HARDWARE REMOVAL;  Surgeon: Leim Fabry, MD;  Location: [facility];  Service: Orthopedics;  Laterality: Right;  . LOWER EXTREMITY ANGIOGRAPHY Right 10/25/2018   Procedure: Lower Extremity Angiography;  Surgeon: Algernon Huxley, MD;  Location: [place] [facility];  Service: Cardiovascular;  Laterality: Right;  . PERIPHERAL VASCULAR INTERVENTION Right 06/20/2018   Procedure: PERIPHERAL VASCULAR INTERVENTION;  Surgeon: Wellington Hampshire, MD;  Location: [place] [facility];  Service: Cardiovascular;  Laterality: Right;  . THORACOTOMY Right   . TONSILLECTOMY      Social History   Socioeconomic History  . Marital status: Married    Spouse name: Not on file  . Number of children: 2  . Years of education: Not on file  . Highest education level: 8th grade  Occupational History  . Occupation: retired  Scientific laboratory technician  . Financial resource strain: Not hard at all  . Food insecurity:    Worry: Never true    Inability: Never true  . Transportation needs:    Medical: No    Non-medical: No  Tobacco Use  . Smoking status: Former Smoker    Last attempt to quit: 10/02/2001    Years since quitting:  17.1  . Smokeless tobacco: Former Systems developer  . Tobacco comment: Quit smoking in 2003; Started smoking at age 42, smoked about 40 years, smoked over 3 packs per day  Substance and Sexual Activity  . Alcohol use: Not Currently  . Drug use: No  . Sexual activity: Not on file  Lifestyle  . Physical activity:    Days per week: 0 days    Minutes per session: 0 min  . Stress: Not at all  Relationships  . Social connections:    Talks on phone: Patient refused    Gets together: Patient refused    Attends religious  service: Patient refused    Active member of club or organization: Patient refused    Attends meetings of clubs or organizations: Patient refused    Relationship status: Patient refused  . Intimate partner violence:    Fear of current or ex partner: Not on file    Emotionally abused: Not on file    Physically abused: Not on file    Forced sexual activity: Not on file  Other Topics Concern  . Not on file  Social History Narrative  . Not on file    Family History  Problem Relation Age of Onset  . Obesity Son   . Diabetes Brother   . Hypertension Brother   . Heart attack Mother   . Heart attack Father     Allergies  Allergen Reactions  . Iodinated Diagnostic Agents Rash    had a red chest -unusre of what kind of dye it was  . Zyban [Bupropion] Rash  . Zocor [Simvastatin] Other (See Comments)    Reaction: hepatitis   . Gadolinium Derivatives Rash     Review of Systems   Review of Systems: Negative Unless Checked Constitutional: _0 Weight loss  _1 Fever  _2 Chills Cardiac: _3 Chest pain   _4  Atrial Fibrillation  _5 Palpitations   _6 Shortness of breath when laying flat   _7 Shortness of breath with exertion. _8 Shortness of breath at rest Vascular:  _9 Pain in legs with walking   _10 Pain in legs with standing _11 Pain in legs when laying flat   _12 Claudication    _13 Pain in feet when laying flat    _14 History of DVT   _15 Phlebitis   _16 Swelling in legs   _17 Varicose veins   _18 Non-healing ulcers Pulmonary:   _19 Uses home oxygen   _20 Productive cough   _21 Hemoptysis   _22 Wheeze  _23 COPD   _24 Asthma Neurologic:  _25 Dizziness   _26 Seizures  _27 Blackouts _28 History of stroke   _29 History of TIA  _30 Aphasia   _31 Temporary Blindness   _32 Weakness or numbness in arm   _33 Weakness or numbness in leg Musculoskeletal:   _34 Joint swelling   _35 Joint pain   _36 Low back pain  _37  History of Knee Replacement _38 Arthritis _39 back Surgeries  _40  Spinal Stenosis    Hematologic:  _41 Easy bruising  _42 Easy bleeding   _43 Hypercoagulable  state   _44 Anemic Gastrointestinal:  _45 Diarrhea   _46 Vomiting  _47 Gastroesophageal reflux/heartburn   _48 Difficulty swallowing. _49 Abdominal pain Genitourinary:  _50 Chronic kidney disease   _51 Difficult urination  _52 Anuric   _53 Blood in urine _54 Frequent urination  _55 Burning with urination   _56 Hematuria Skin:  _57 Rashes   _58 Ulcers _59 Wounds Psychological:  _60 History of anxiety   _61  History of major depression  _62  Memory Difficulties      OBJECTIVE:   Physical Exam  BP (!) 144/67 (BP Location: Left Arm, Patient Position: Sitting, Cuff Size: Large)   Pulse 83   Resp 10   Ht _63  (1.778 m)  Wt 232 lb (105.2 kg)   BMI 33.29 kg/m   Gen: WD/WN, NAD Head: /AT, No temporalis wasting.  Ear/Nose/Throat: Hearing grossly intact, nares w/o erythema or drainage Eyes: PER, EOMI, sclera nonicteric.  Neck: Supple, no masses.  No JVD.  Pulmonary:  Good air movement, no use of accessory muscles.  Cardiac: RRR Vascular:  Vessel Right Left  Radial Palpable Palpable   Gastrointestinal: soft, non-distended. No guarding/no peritoneal signs.  Musculoskeletal: M/S 5/5 throughout.  No deformity or atrophy.  Neurologic: Pain and light touch intact in extremities.  Symmetrical.  Speech is fluent. Motor exam as listed above. Psychiatric: Judgment intact, Mood & affect appropriate for pt's clinical situation. Dermatologic: No Venous rashes. No Ulcers Noted.  No changes consistent with cellulitis. Lymph : No Cervical lymphadenopathy, no lichenification or skin changes of chronic lymphedema.       ASSESSMENT AND PLAN:  1. Hx of BKA, right ([place]) Patient will return in 6 weeks in order to evaluate the healing process at this time.  In the meantime we will reach out to begin the process of getting a physical therapy evaluate and treat, gait training, shrinker sock, and fitting for a prosthetic leg.  Patient should contact us if there are areas where the wound opens up or there is suddenly increasing drainage or  pain.  2. Benign essential HTN Continue antihypertensive medications as already ordered, these medications have been reviewed and there are no changes at this time.   3. Arthritis Continue NSAID medications as already ordered, these medications have been reviewed and there are no changes at this time.  Continued activity and therapy was stressed.    Current Outpatient Medications on File Prior to Visit  Medication Sig Dispense Refill  . amLODipine-benazepril (LOTREL) 5-40 MG capsule TAKE 1 CAPSULE BY MOUTH EVERY DAY (Patient taking differently: Take 1 capsule by mouth daily. ) 90 capsule 3  . aspirin EC 81 MG tablet Take 1 tablet (81 mg total) by mouth daily. 30 tablet 2  . B-D UF III MINI PEN NEEDLES 31G X 5 MM MISC USE WITH PEN DAILY 100 each 3  . Blood Glucose Monitoring Suppl (ONE TOUCH ULTRA SYSTEM KIT) w/Device KIT To use daily to check sugar. DX E11.9-needs one touch ultra meter 1 each 0  . cholecalciferol (VITAMIN D) 25 MCG (1000 UT) tablet Take 1,000 Units by mouth daily.     . clopidogrel (PLAVIX) 75 MG tablet Take 1 tablet (75 mg total) by mouth daily. 30 tablet 6  . Coenzyme Q10 100 MG TABS Take 100 mg by mouth daily.     . fluticasone (FLONASE) 50 MCG/ACT nasal spray USE 2 SPRAYS IN EACH NOSTRIL EVERY DAY (Patient taking differently: Place 2 sprays into both nostrils daily. ) 48 g 3  . glucose blood (ONE TOUCH ULTRA TEST) test strip Check sugar 3 times daily. DX E11.9-strips for one touch ultra meter 100 each 11  . liraglutide (VICTOZA) 18 MG/3ML SOPN INJECT 1.2 SUBCUTANEOUSLY EVERY DAY (Patient taking differently: Inject 1.2 mg into the skin daily. INJECT 1.2 SUBCUTANEOUSLY EVERY DAY) 9 pen 3  . metFORMIN (GLUCOPHAGE) 500 MG tablet TAKE 1 TABLET (500 MG TOTAL) BY MOUTH 2 (TWO) TIMES DAILY WITH A MEAL. 180 tablet 3  . Meth-Hyo-M Bl-Na Phos-Ph Sal (URIBEL) 118 MG CAPS Take 1 capsule (118 mg total) by mouth every 6 (six) hours as needed (dysuria). 40 capsule 3  . metoprolol  succinate (TOPROL-XL) 100 MG 24 hr tablet TAKE 1 TABLET BY MOUTH EVERY  DAY (Patient taking differently: Take 100 mg by mouth daily. ) 90 tablet 3  . montelukast (SINGULAIR) 10 MG tablet Take 1 tablet (10 mg total) by mouth daily. 90 tablet 3  . omeprazole (PRILOSEC) 40 MG capsule TAKE ONE CAPSULE BY MOUTH EVERY DAY (Patient taking differently: Take 40 mg by mouth daily. ) 90 capsule 3  . ONETOUCH DELICA LANCETS FINE MISC 1 Device by Does not apply route 3 (three) times daily. 100 each 11  . oxyCODONE (OXY IR/ROXICODONE) 5 MG immediate release tablet Take 1 tablet (5 mg total) by mouth every 6 (six) hours as needed for moderate pain. 16 tablet 0  . rosuvastatin (CRESTOR) 20 MG tablet TAKE 1 TABLET BY MOUTH EVERY DAY (Patient taking differently: Take 20 mg by mouth daily. ) 90 tablet 2  . sildenafil (VIAGRA) 100 MG tablet Take 100 mg by mouth as needed for erectile dysfunction.     . vitamin B-12 (CYANOCOBALAMIN) 1000 MCG tablet Take 1,000 mcg by mouth daily.    Marland Kitchen amoxicillin-clavulanate (AUGMENTIN) 875-125 MG tablet Take 1 tablet by mouth 2 (two) times daily. (Patient not taking: Reported on 11/14/2018) 14 tablet 0  . JARDIANCE 25 MG TABS tablet TAKE 1 TABLET BY MOUTH DAILY (Patient not taking: No sig reported) 30 tablet 10   No current facility-administered medications on file prior to visit.     There are no Patient Instructions on file for this visit. No follow-ups on file.   Kris Hartmann, NP  This note was completed with Sales executive.  Any errors are purely unintentional.

## 2018-12-11 ENCOUNTER — Ambulatory Visit (INDEPENDENT_AMBULATORY_CARE_PROVIDER_SITE_OTHER): Payer: PPO | Admitting: Family Medicine

## 2018-12-11 ENCOUNTER — Other Ambulatory Visit: Payer: Self-pay

## 2018-12-11 ENCOUNTER — Encounter: Payer: Self-pay | Admitting: Family Medicine

## 2018-12-11 VITALS — BP 122/62 | HR 81 | Temp 97.8°F | Ht 70.0 in | Wt 233.8 lb

## 2018-12-11 DIAGNOSIS — Z6838 Body mass index (BMI) 38.0-38.9, adult: Secondary | ICD-10-CM

## 2018-12-11 DIAGNOSIS — I2510 Atherosclerotic heart disease of native coronary artery without angina pectoris: Secondary | ICD-10-CM | POA: Diagnosis not present

## 2018-12-11 DIAGNOSIS — E1159 Type 2 diabetes mellitus with other circulatory complications: Secondary | ICD-10-CM

## 2018-12-11 DIAGNOSIS — K22710 Barrett's esophagus with low grade dysplasia: Secondary | ICD-10-CM

## 2018-12-11 DIAGNOSIS — E7800 Pure hypercholesterolemia, unspecified: Secondary | ICD-10-CM

## 2018-12-11 DIAGNOSIS — G4733 Obstructive sleep apnea (adult) (pediatric): Secondary | ICD-10-CM

## 2018-12-11 DIAGNOSIS — S88911S Complete traumatic amputation of right lower leg, level unspecified, sequela: Secondary | ICD-10-CM

## 2018-12-11 MED ORDER — CLOPIDOGREL BISULFATE 75 MG PO TABS: 75.0000 mg | ORAL_TABLET | Freq: Every day | ORAL | 11 refills | Status: DC

## 2018-12-11 NOTE — Progress Notes (Signed)
Patient: Andrew Lyons Male    DOB: 10/28/1945   73 y.o.   MRN: [id number] Visit Date: 12/11/2018  Today's Provider: [name], MD   Chief Complaint  Patient presents with   Diabetes    4 month fup   Subjective:     HPI   Diabetes Mellitus Type II, Follow-up:   Lab Results  Component Value Date   HGBA1C 5.8 (H) 08/22/2018   HGBA1C 9.0 (A) 04/26/2018   HGBA1C 7.8 12/25/2017    Last seen for diabetes 4 months ago.  Management since then includes no changes. He reports good compliance with treatment. He is not having side effects.  Current symptoms include none and have been stable. Home blood sugar records: fasting range: Less than 200  Episodes of hypoglycemia? no   Current Insulin Regimen:  Most Recent Eye Exam:  Weight trend: stable Prior visit with dietician: Yes  Current exercise: none Current diet habits: well balanced  Patient stump is healing well from recent re-amputation BKA.  He has no signs or symptoms of infection.  Of note is the patient snores and has witnessed apneic spells.  He does not want to have sleep study and we talked about that at some length today. Pertinent Labs:    Component Value Date/Time   CHOL 133 08/22/2018 0756   TRIG 174 (H) 08/22/2018 0756   HDL 33 (L) 08/22/2018 0756   LDLCALC 65 08/22/2018 0756   LDLCALC 71 09/21/2017 0825   CREATININE 0.85 10/28/2018 0331    Wt Readings from Last 3 Encounters:  12/11/18 233 lb 12.8 oz (106.1 kg)  11/27/18 232 lb (105.2 kg)  11/06/18 237 lb (107.5 kg)    ------------------------------------------------------------------------   Allergies  Allergen Reactions   Iodinated Diagnostic Agents Rash    had a red chest -unusre of what kind of dye it was   Zyban [Bupropion] Rash   Zocor [Simvastatin] Other (See Comments)    Reaction: hepatitis    Gadolinium Derivatives Rash     Current Outpatient Medications:    amLODipine-benazepril (LOTREL) 5-40 MG  capsule, TAKE 1 CAPSULE BY MOUTH EVERY DAY (Patient taking differently: Take 1 capsule by mouth daily. ), Disp: 90 capsule, Rfl: 3   aspirin EC 81 MG tablet, Take 1 tablet (81 mg total) by mouth daily., Disp: 30 tablet, Rfl: 2   B-D UF III MINI PEN NEEDLES 31G X 5 MM MISC, USE WITH PEN DAILY, Disp: 100 each, Rfl: 3   Blood Glucose Monitoring Suppl (ONE TOUCH ULTRA SYSTEM KIT) w/Device KIT, To use daily to check sugar. DX E11.9-needs one touch ultra meter, Disp: 1 each, Rfl: 0   cholecalciferol (VITAMIN D) 25 MCG (1000 UT) tablet, Take 1,000 Units by mouth daily. , Disp: , Rfl:    clopidogrel (PLAVIX) 75 MG tablet, Take 1 tablet (75 mg total) by mouth daily., Disp: 30 tablet, Rfl: 6   Coenzyme Q10 100 MG TABS, Take 100 mg by mouth daily. , Disp: , Rfl:    fluticasone (FLONASE) 50 MCG/ACT nasal spray, USE 2 SPRAYS IN EACH NOSTRIL EVERY DAY (Patient taking differently: Place 2 sprays into both nostrils daily. ), Disp: 48 g, Rfl: 3   glucose blood (ONE TOUCH ULTRA TEST) test strip, Check sugar 3 times daily. DX E11.9-strips for one touch ultra meter, Disp: 100 each, Rfl: 11   JARDIANCE 25 MG TABS tablet, TAKE 1 TABLET BY MOUTH DAILY, Disp: 30 tablet, Rfl: 10   liraglutide (VICTOZA)  18 MG/3ML SOPN, INJECT 1.2 SUBCUTANEOUSLY EVERY DAY (Patient taking differently: Inject 1.2 mg into the skin daily. INJECT 1.2 SUBCUTANEOUSLY EVERY DAY), Disp: 9 pen, Rfl: 3   metFORMIN (GLUCOPHAGE) 500 MG tablet, TAKE 1 TABLET (500 MG TOTAL) BY MOUTH 2 (TWO) TIMES DAILY WITH A MEAL., Disp: 180 tablet, Rfl: 3   metoprolol succinate (TOPROL-XL) 100 MG 24 hr tablet, TAKE 1 TABLET BY MOUTH EVERY DAY (Patient taking differently: Take 100 mg by mouth daily. ), Disp: 90 tablet, Rfl: 3   montelukast (SINGULAIR) 10 MG tablet, Take 1 tablet (10 mg total) by mouth daily., Disp: 90 tablet, Rfl: 3   omeprazole (PRILOSEC) 40 MG capsule, TAKE ONE CAPSULE BY MOUTH EVERY DAY (Patient taking differently: Take 40 mg by mouth daily.  ), Disp: 90 capsule, Rfl: 3   ONETOUCH DELICA LANCETS FINE MISC, 1 Device by Does not apply route 3 (three) times daily., Disp: 100 each, Rfl: 11   rosuvastatin (CRESTOR) 20 MG tablet, TAKE 1 TABLET BY MOUTH EVERY DAY (Patient taking differently: Take 20 mg by mouth daily. ), Disp: 90 tablet, Rfl: 2   sildenafil (VIAGRA) 100 MG tablet, Take 100 mg by mouth as needed for erectile dysfunction. , Disp: , Rfl:    vitamin B-12 (CYANOCOBALAMIN) 1000 MCG tablet, Take 1,000 mcg by mouth daily., Disp: , Rfl:    oxyCODONE (OXY IR/ROXICODONE) 5 MG immediate release tablet, Take 1 tablet (5 mg total) by mouth every 6 (six) hours as needed for moderate pain. (Patient not taking: Reported on 12/11/2018), Disp: 16 tablet, Rfl: 0  Review of Systems  Constitutional: Negative.   HENT: Negative.   Eyes: Negative.   Respiratory: Negative.   Cardiovascular: Negative.   Gastrointestinal: Negative.   Endocrine: Negative.   Genitourinary: Negative.   Musculoskeletal: Negative.   Skin: Negative.   Allergic/Immunologic: Negative.   Neurological: Negative.   Hematological: Negative.   Psychiatric/Behavioral: Negative.     Social History   Tobacco Use   Smoking status: Former Smoker    Last attempt to quit: 10/02/2001    Years since quitting: 17.2   Smokeless tobacco: Former Systems developer   Tobacco comment: Quit smoking in 2003; Started smoking at age 89, smoked about 40 years, smoked over 3 packs per day  Substance Use Topics   Alcohol use: Not Currently      Objective:   BP 122/62 (BP Location: Right Arm, Patient Position: Sitting, Cuff Size: Large)    Pulse 81    Temp 97.8 F (36.6 C) (Oral)    Ht _0  (1.778 m)    Wt 233 lb 12.8 oz (106.1 kg)    SpO2 97%    BMI 33.55 kg/m  Vitals:   12/11/18 1006  BP: 122/62  Pulse: 81  Temp: 97.8 F (36.6 C)  TempSrc: Oral  SpO2: 97%  Weight: 233 lb 12.8 oz (106.1 kg)  Height: _1  (1.778 m)     Physical Exam Vitals signs reviewed.  Constitutional:       Appearance: He is well-developed.  HENT:     Head: Normocephalic and atraumatic.     Right Ear: External ear normal.     Left Ear: External ear normal.     Nose: Nose normal.  Eyes:     General: No scleral icterus.    Conjunctiva/sclera: Conjunctivae normal.  Neck:     Thyroid: No thyromegaly.  Cardiovascular:     Rate and Rhythm: Normal rate and regular rhythm.     Heart sounds: Normal  heart sounds.  Pulmonary:     Effort: Pulmonary effort is normal.     Breath sounds: Normal breath sounds.  Abdominal:     Palpations: Abdomen is soft.  Musculoskeletal:     Comments: S/p Right BKA.  Skin:    General: Skin is warm and dry.     Comments: BKA stump is healing well.  The wound looks really good.  Neurological:     General: No focal deficit present.     Mental Status: He is alert and oriented to person, place, and time.  Psychiatric:        Mood and Affect: Mood normal.        Behavior: Behavior normal.        Thought Content: Thought content normal.        Judgment: Judgment normal.         Assessment & Plan    1. Type 2 diabetes mellitus with other circulatory complication, without long-term current use of insulin (HCC) Need to send out A1c. - Hemoglobin A1c  2. Atherosclerosis of native coronary artery of native heart without angina pectoris  - clopidogrel (PLAVIX) 75 MG tablet; Take 1 tablet (75 mg total) by mouth daily.  Dispense: 30 tablet; Refill: 11 3.OSA Recommend sleep study.,  Patient is to think about it. 4. Barrett's esophagus with low grade dysplasia On chronic PPI  5. Traumatic amputation of right lower extremity, sequela ([place]) Healing well.  6. Pure hypercholesterolemia On Crestor.  7. Class 2 severe obesity due to excess calories with serious comorbidity and body mass index (BMI) of 38.0 to 38.9 in adult [facility]) Hypertension, GERD, hyperlipidemia    .rlgi  [name], MD  [facility]

## 2018-12-12 LAB — HEMOGLOBIN A1C
Est. average glucose Bld gHb Est-mCnc: 114 mg/dL
Hgb A1c MFr Bld: 5.6 % (ref 4.8–5.6)

## 2018-12-15 ENCOUNTER — Other Ambulatory Visit: Payer: Self-pay | Admitting: Family Medicine

## 2018-12-20 ENCOUNTER — Telehealth: Payer: Self-pay

## 2018-12-20 MED ORDER — LIRAGLUTIDE 18 MG/3ML ~~LOC~~ SOPN: PEN_INJECTOR | SUBCUTANEOUS | 3 refills | Status: DC

## 2018-12-20 NOTE — Telephone Encounter (Signed)
Medication was sent into the pharmacy.  

## 2018-12-20 NOTE — Telephone Encounter (Signed)
Patient calling if Dr.GIlbert nurse can call [facility] to change his prescription. Reports that he is taking 1.8 instead of 1.2 of Victoza and he is going to run out because of the way the prescription was written.

## 2018-12-22 ENCOUNTER — Other Ambulatory Visit: Payer: Self-pay | Admitting: Family Medicine

## 2018-12-26 ENCOUNTER — Telehealth: Payer: Self-pay | Admitting: Cardiovascular Disease

## 2018-12-26 NOTE — Telephone Encounter (Signed)
Patient declined to schedule Recall and ABI because he had to have amputation in February and feels he no longer needs these tests/ appts.   Deleting recall per patient request

## 2018-12-29 ENCOUNTER — Other Ambulatory Visit: Payer: Self-pay | Admitting: Family Medicine

## 2018-12-29 DIAGNOSIS — J301 Allergic rhinitis due to pollen: Secondary | ICD-10-CM

## 2018-12-30 ENCOUNTER — Other Ambulatory Visit: Payer: Self-pay | Admitting: Family Medicine

## 2019-01-01 ENCOUNTER — Encounter (INDEPENDENT_AMBULATORY_CARE_PROVIDER_SITE_OTHER): Payer: Self-pay | Admitting: Vascular Surgery

## 2019-01-01 ENCOUNTER — Ambulatory Visit: Payer: Self-pay | Admitting: Pharmacist

## 2019-01-01 ENCOUNTER — Other Ambulatory Visit: Payer: Self-pay

## 2019-01-01 ENCOUNTER — Ambulatory Visit (INDEPENDENT_AMBULATORY_CARE_PROVIDER_SITE_OTHER): Payer: PPO | Admitting: Vascular Surgery

## 2019-01-01 VITALS — BP 132/71 | HR 73 | Resp 16

## 2019-01-01 DIAGNOSIS — I10 Essential (primary) hypertension: Secondary | ICD-10-CM | POA: Diagnosis not present

## 2019-01-01 DIAGNOSIS — Z89511 Acquired absence of right leg below knee: Secondary | ICD-10-CM

## 2019-01-01 DIAGNOSIS — L570 Actinic keratosis: Secondary | ICD-10-CM | POA: Diagnosis not present

## 2019-01-01 DIAGNOSIS — E1151 Type 2 diabetes mellitus with diabetic peripheral angiopathy without gangrene: Secondary | ICD-10-CM

## 2019-01-01 DIAGNOSIS — Z79899 Other long term (current) drug therapy: Secondary | ICD-10-CM

## 2019-01-01 DIAGNOSIS — E1159 Type 2 diabetes mellitus with other circulatory complications: Secondary | ICD-10-CM

## 2019-01-01 DIAGNOSIS — I739 Peripheral vascular disease, unspecified: Secondary | ICD-10-CM | POA: Diagnosis not present

## 2019-01-01 DIAGNOSIS — Z7984 Long term (current) use of oral hypoglycemic drugs: Secondary | ICD-10-CM | POA: Diagnosis not present

## 2019-01-01 DIAGNOSIS — E7800 Pure hypercholesterolemia, unspecified: Secondary | ICD-10-CM | POA: Diagnosis not present

## 2019-01-01 DIAGNOSIS — I2510 Atherosclerotic heart disease of native coronary artery without angina pectoris: Secondary | ICD-10-CM

## 2019-01-01 DIAGNOSIS — Z23 Encounter for immunization: Secondary | ICD-10-CM | POA: Diagnosis not present

## 2019-01-01 DIAGNOSIS — G4733 Obstructive sleep apnea (adult) (pediatric): Secondary | ICD-10-CM

## 2019-01-01 DIAGNOSIS — D225 Melanocytic nevi of trunk: Secondary | ICD-10-CM | POA: Diagnosis not present

## 2019-01-01 DIAGNOSIS — L821 Other seborrheic keratosis: Secondary | ICD-10-CM | POA: Diagnosis not present

## 2019-01-01 DIAGNOSIS — L814 Other melanin hyperpigmentation: Secondary | ICD-10-CM | POA: Diagnosis not present

## 2019-01-01 NOTE — Assessment & Plan Note (Signed)
Had intervention but ultimately had to have an amputation for infection and wound issues.  States that his left leg had poor blood flow prior to all of this.  I told him once everything is healed and he has his prosthesis in the next couple of months, I think we should check his ABI and determine whether or not intervention will be required on the left leg.

## 2019-01-01 NOTE — Progress Notes (Signed)
MRN : [id number]  Andrew Lyons is a 73 y.o. (1945-11-02) male who presents with chief complaint of  Chief Complaint  Patient presents with   Follow-up    6week follow up  .  History of Present Illness: Patient returns today in follow up of his PAD and right below-knee amputation.  He had a right below-knee amputation performed by another surgeon in association with hardware removal by an orthopedic surgeon.  He has done very well from this and his wound is well-healed.  He is in the process of getting his prosthesis and is scheduled to go back for fitting and obtaining the prosthesis in the next week or 2.  He is concerned about the blood flow in his left leg knowing that it was worse than the right leg before the wounds and infection.  No current wounds or rest pain of the left foot.  Current Outpatient Medications  Medication Sig Dispense Refill   amLODipine-benazepril (LOTREL) 5-40 MG capsule TAKE 1 CAPSULE BY MOUTH EVERY DAY (Patient taking differently: Take 1 capsule by mouth daily. ) 90 capsule 3   aspirin EC 81 MG tablet Take 1 tablet (81 mg total) by mouth daily. 30 tablet 2   B-D UF III MINI PEN NEEDLES 31G X 5 MM MISC USE WITH PEN DAILY 100 each 3   Blood Glucose Monitoring Suppl (ONE TOUCH ULTRA SYSTEM KIT) w/Device KIT To use daily to check sugar. DX E11.9-needs one touch ultra meter 1 each 0   cholecalciferol (VITAMIN D) 25 MCG (1000 UT) tablet Take 1,000 Units by mouth daily.      clopidogrel (PLAVIX) 75 MG tablet Take 1 tablet (75 mg total) by mouth daily. 30 tablet 11   Coenzyme Q10 100 MG TABS Take 100 mg by mouth daily.      fluticasone (FLONASE) 50 MCG/ACT nasal spray USE 2 SPRAYS IN EACH NOSTRIL EVERY DAY (Patient taking differently: Place 2 sprays into both nostrils daily. ) 48 g 3   JARDIANCE 25 MG TABS tablet TAKE 1 TABLET BY MOUTH DAILY 30 tablet 10   liraglutide (VICTOZA) 18 MG/3ML SOPN INJECT 1.8 SUBCUTANEOUSLY EVERY DAY 9 pen 3   metFORMIN  (GLUCOPHAGE) 500 MG tablet TAKE 1 TABLET (500 MG TOTAL) BY MOUTH 2 (TWO) TIMES DAILY WITH A MEAL. 180 tablet 3   metoprolol succinate (TOPROL-XL) 100 MG 24 hr tablet TAKE 1 TABLET BY MOUTH EVERY DAY 90 tablet 3   montelukast (SINGULAIR) 10 MG tablet TAKE 1 TABLET BY MOUTH EVERY DAY 90 tablet 3   omeprazole (PRILOSEC) 40 MG capsule TAKE 1 CAPSULE BY MOUTH EVERY DAY 90 capsule 3   ONETOUCH DELICA LANCETS FINE MISC 1 Device by Does not apply route 3 (three) times daily. 100 each 11   ONETOUCH VERIO test strip USE 1 STRIP TO CHECK GLUCOSE TWICE DAILY 100 each 0   rosuvastatin (CRESTOR) 20 MG tablet TAKE 1 TABLET BY MOUTH EVERY DAY (Patient taking differently: Take 20 mg by mouth daily. ) 90 tablet 2   sildenafil (VIAGRA) 100 MG tablet Take 100 mg by mouth as needed for erectile dysfunction.      vitamin B-12 (CYANOCOBALAMIN) 1000 MCG tablet Take 1,000 mcg by mouth daily.     oxyCODONE (OXY IR/ROXICODONE) 5 MG immediate release tablet Take 1 tablet (5 mg total) by mouth every 6 (six) hours as needed for moderate pain. (Patient not taking: Reported on 12/11/2018) 16 tablet 0   No current facility-administered medications for this visit.  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    Barrett's esophagus with dysplasia    COPD (chronic obstructive pulmonary disease) (HCC)    Coronary artery disease    Diabetes mellitus without complication (HCC)    GERD (gastroesophageal reflux disease)    Hepatitis    Hyperlipidemia    Hypertension    Myocardial infarction ([place])    Seizures ([place])    Sleep apnea     Past Surgical History:  Procedure Laterality Date   ABDOMINAL AORTOGRAM W/LOWER EXTREMITY N/A 06/20/2018   Procedure: ABDOMINAL AORTOGRAM W/LOWER EXTREMITY;  Surgeon: Wellington Hampshire, MD;  Location: [place] [facility];  Service: Cardiovascular;  Laterality: N/A;   AMPUTATION Right 10/27/2018   Procedure: AMPUTATION BELOW KNEE;   Surgeon: Leim Fabry, MD;  Location: [facility];  Service: Orthopedics;  Laterality: Right;   CARDIAC CATHETERIZATION     with Angioplasty   cardiac stents     CARDIAC VALVE REPLACEMENT     Aortic Valve Replacement   COLONOSCOPY WITH PROPOFOL N/A 03/18/2015   Procedure: COLONOSCOPY WITH PROPOFOL;  Surgeon: [name], MD;  Location: [facility] ENDOSCOPY;  Service: Endoscopy;  Laterality: N/A;   ESOPHAGOGASTRODUODENOSCOPY N/A 03/18/2015   Procedure: ESOPHAGOGASTRODUODENOSCOPY (EGD);  Surgeon: [name], MD;  Location: [facility] ENDOSCOPY;  Service: Endoscopy;  Laterality: N/A;   FOOT AMPUTATION     GREEN LIGHT LASER TURP (TRANSURETHRAL RESECTION OF PROSTATE N/A 10/16/2018   Procedure: GREEN LIGHT LASER TURP (TRANSURETHRAL RESECTION OF PROSTATE;  Surgeon: Royston Cowper, MD;  Location: [facility];  Service: Urology;  Laterality: N/A;   HARDWARE REMOVAL Right 10/27/2018   Procedure: HARDWARE REMOVAL;  Surgeon: Leim Fabry, MD;  Location: [facility];  Service: Orthopedics;  Laterality: Right;   LOWER EXTREMITY ANGIOGRAPHY Right 10/25/2018   Procedure: Lower Extremity Angiography;  Surgeon: Algernon Huxley, MD;  Location: [place] [facility];  Service: Cardiovascular;  Laterality: Right;   PERIPHERAL VASCULAR INTERVENTION Right 06/20/2018   Procedure: PERIPHERAL VASCULAR INTERVENTION;  Surgeon: Wellington Hampshire, MD;  Location: [place] [facility];  Service: Cardiovascular;  Laterality: Right;   THORACOTOMY Right    TONSILLECTOMY     Social History        Tobacco Use   Smoking status: Former Smoker    Last attempt to quit: 10/02/2001    Years since quitting: 17.1   Smokeless tobacco: Former Systems developer   Tobacco comment: Quit smoking in 2003; Started smoking at age 45, smoked about 40 years, smoked over 3 packs per day  Substance Use Topics   Alcohol use: Not Currently   Drug use: No    Family History      Family History  Problem Relation Age of Onset   Obesity Son     Diabetes Brother    Hypertension Brother    Heart attack Mother    Heart attack Father          Allergies  Allergen Reactions   Iodinated Diagnostic Agents Rash    had a red chest -unusre of what kind of dye it was   Zyban [Bupropion] Rash   Zocor [Simvastatin] Other (See Comments)    Reaction: hepatitis    Gadolinium Derivatives Rash     REVIEW OF SYSTEMS (Negative unless checked)  Constitutional: []?Weight loss  []?Fever  []?Chills Cardiac: []?Chest pain   []?Chest pressure   []?Palpitations   []?Shortness of breath when laying flat   []?Shortness of breath at rest   []?  Shortness of breath with exertion. Vascular:  []?Pain in legs with walking   []?Pain in legs at rest   []?Pain in legs when laying flat   []?Claudication   []?Pain in feet when walking  []?Pain in feet at rest  []?Pain in feet when laying flat   []?History of DVT   []?Phlebitis   []?Swelling in legs   []?Varicose veins   [x]?Non-healing ulcers Pulmonary:   []?Uses home oxygen   []?Productive cough   []?Hemoptysis   []?Wheeze  [x]?COPD   []?Asthma Neurologic:  []?Dizziness  []?Blackouts   [x]?Seizures   []?History of stroke   []?History of TIA  []?Aphasia   []?Temporary blindness   []?Dysphagia   []?Weakness or numbness in arms   []?Weakness or numbness in legs Musculoskeletal:  [x]?Arthritis   []?Joint swelling   []?Joint pain   []?Low back pain Hematologic:  []?Easy bruising  []?Easy bleeding   []?Hypercoagulable state   []?Anemic   Gastrointestinal:  []?Blood in stool   []?Vomiting blood  [x]?Gastroesophageal reflux/heartburn   [x]?Abdominal pain Genitourinary:  []?Chronic kidney disease   []?Difficult urination  []?Frequent urination  []?Burning with urination   []?Hematuria Skin:  []?Rashes   [x]?Ulcers   [x]?Wounds Psychological:  []?History of anxiety   []? History of major depression.    Physical Examination  BP 132/71 (BP Location: Right Arm)    Pulse 73    Resp 16  Gen:  WD/WN,  NAD Head: [place]/AT, No temporalis wasting. Ear/Nose/Throat: Hearing grossly intact, nares w/o erythema or drainage Eyes: Conjunctiva clear. Sclera non-icteric Neck: Supple.  Trachea midline Pulmonary:  Good air movement, no use of accessory muscles.  Cardiac: RRR, no JVD Vascular:  Vessel Right Left  Radial Palpable Palpable                          PT  not palpable  1+ palpable  DP  not palpable  1+ palpable    Musculoskeletal: M/S 5/5 throughout.  Right BKA well-healed.  No edema. Neurologic: Sensation grossly intact in extremities.  Symmetrical.  Speech is fluent.  Psychiatric: Judgment intact, Mood & affect appropriate for pt's clinical situation. Dermatologic: No rashes or ulcers noted.  No cellulitis or open wounds.       Labs Recent Results (from the past 2160 hour(s))  Glucose, capillary     Status: Abnormal   Collection Time: 10/16/18  1:15 PM  Result Value Ref Range   Glucose-Capillary 193 (H) 70 - 99 mg/dL  Glucose, capillary     Status: Abnormal   Collection Time: 10/16/18  3:26 PM  Result Value Ref Range   Glucose-Capillary 157 (H) 70 - 99 mg/dL  CBC with Differential     Status: None   Collection Time: 10/24/18 11:34 PM  Result Value Ref Range   WBC 8.1 4.0 - 10.5 K/uL   RBC 4.69 4.22 - 5.81 MIL/uL   Hemoglobin 14.5 13.0 - 17.0 g/dL   HCT 42.3 39.0 - 52.0 %   MCV 90.2 80.0 - 100.0 fL   MCH 30.9 26.0 - 34.0 pg   MCHC 34.3 30.0 - 36.0 g/dL   RDW 14.4 11.5 - 15.5 %   Platelets 216 150 - 400 K/uL   nRBC 0.0 0.0 - 0.2 %   Neutrophils Relative % 70 %   Neutro Abs 5.7 1.7 - 7.7 K/uL   Lymphocytes Relative 16 %   Lymphs Abs 1.3 0.7 - 4.0 K/uL   Monocytes Relative  11 %   Monocytes Absolute 0.9 0.1 - 1.0 K/uL   Eosinophils Relative 2 %   Eosinophils Absolute 0.2 0.0 - 0.5 K/uL   Basophils Relative 1 %   Basophils Absolute 0.1 0.0 - 0.1 K/uL   Immature Granulocytes 0 %   Abs Immature Granulocytes 0.02 0.00 - 0.07 K/uL    Comment: Performed at [facility], [address] [place], [place] [address]  Comprehensive metabolic panel     Status: Abnormal   Collection Time: 10/24/18 11:34 PM  Result Value Ref Range   Sodium 139 135 - 145 mmol/L   Potassium 3.6 3.5 - 5.1 mmol/L   Chloride 105 98 - 111 mmol/L   CO2 24 22 - 32 mmol/L   Glucose, Bld 173 (H) 70 - 99 mg/dL   BUN 24 (H) 8 - 23 mg/dL   Creatinine, Ser 1.14 0.61 - 1.24 mg/dL   Calcium 9.0 8.9 - 10.3 mg/dL   Total Protein 7.3 6.5 - 8.1 g/dL   Albumin 4.2 3.5 - 5.0 g/dL   AST 14 (L) 15 - 41 U/L   ALT 10 0 - 44 U/L   Alkaline Phosphatase 85 38 - 126 U/L   Total Bilirubin 1.2 0.3 - 1.2 mg/dL   GFR calc non Af Amer >60 >60 mL/min   GFR calc Af Amer >60 >60 mL/min   Anion gap 10 5 - 15    Comment: Performed at [facility] [address] [place], [place] [address]  Lactic acid, plasma     Status: None   Collection Time: 10/24/18 11:35 PM  Result Value Ref Range   Lactic Acid, Venous 1.2 0.5 - 1.9 mmol/L    Comment: Performed at [facility], [address] [place], [place] [address]  Glucose, capillary     Status: Abnormal   Collection Time: 10/25/18  9:03 AM  Result Value Ref Range   Glucose-Capillary 116 (H) 70 - 99 mg/dL  Glucose, capillary     Status: Abnormal   Collection Time: 10/25/18 12:55 PM  Result Value Ref Range   Glucose-Capillary 114 (H) 70 - 99 mg/dL  Glucose, capillary     Status: Abnormal   Collection Time: 10/25/18  2:10 PM  Result Value Ref Range   Glucose-Capillary 126 (H) 70 - 99 mg/dL  Glucose, capillary     Status: Abnormal   Collection Time: 10/25/18  3:38 PM  Result Value Ref Range   Glucose-Capillary 182 (H) 70 - 99 mg/dL  Glucose, capillary     Status: Abnormal   Collection Time: 10/25/18  5:59 PM  Result Value Ref Range   Glucose-Capillary 220 (H) 70 - 99 mg/dL  Glucose, capillary     Status: Abnormal   Collection Time: 10/25/18  9:36 PM  Result Value Ref Range   Glucose-Capillary 259 (H) 70 - 99 mg/dL    Comment 1 Notify RN   Glucose, capillary     Status: Abnormal   Collection Time: 10/26/18  7:53 AM  Result Value Ref Range   Glucose-Capillary 233 (H) 70 - 99 mg/dL  Glucose, capillary     Status: Abnormal   Collection Time: 10/26/18 11:46 AM  Result Value Ref Range   Glucose-Capillary 215 (H) 70 - 99 mg/dL  Glucose, capillary     Status: Abnormal   Collection Time: 10/26/18  5:17 PM  Result Value Ref Range   Glucose-Capillary 235 (H) 70 - 99 mg/dL  Glucose, capillary     Status:  Abnormal   Collection Time: 10/26/18  9:26 PM  Result Value Ref Range   Glucose-Capillary 198 (H) 70 - 99 mg/dL  CBC     Status: Abnormal   Collection Time: 10/27/18  4:55 AM  Result Value Ref Range   WBC 9.1 4.0 - 10.5 K/uL   RBC 3.91 (L) 4.22 - 5.81 MIL/uL   Hemoglobin 11.8 (L) 13.0 - 17.0 g/dL   HCT 35.7 (L) 39.0 - 52.0 %   MCV 91.3 80.0 - 100.0 fL   MCH 30.2 26.0 - 34.0 pg   MCHC 33.1 30.0 - 36.0 g/dL   RDW 14.4 11.5 - 15.5 %   Platelets 203 150 - 400 K/uL   nRBC 0.0 0.0 - 0.2 %    Comment: Performed at [facility], [address] [place], [place] [address]  Basic metabolic panel     Status: Abnormal   Collection Time: 10/27/18  4:55 AM  Result Value Ref Range   Sodium 136 135 - 145 mmol/L   Potassium 3.8 3.5 - 5.1 mmol/L    Comment: HEMOLYSIS AT THIS LEVEL MAY AFFECT RESULT   Chloride 108 98 - 111 mmol/L   CO2 21 (L) 22 - 32 mmol/L   Glucose, Bld 171 (H) 70 - 99 mg/dL   BUN 28 (H) 8 - 23 mg/dL   Creatinine, Ser 1.08 0.61 - 1.24 mg/dL   Calcium 8.3 (L) 8.9 - 10.3 mg/dL   GFR calc non Af Amer >60 >60 mL/min   GFR calc Af Amer >60 >60 mL/min   Anion gap 7 5 - 15    Comment: Performed at [facility], [address] [place], [place] [address]  Magnesium     Status: None   Collection Time: 10/27/18  4:55 AM  Result Value Ref Range   Magnesium 2.4 1.7 - 2.4 mg/dL    Comment: Performed at [facility], [address] [place], [place] [address]  Glucose,  capillary     Status: Abnormal   Collection Time: 10/27/18  7:53 AM  Result Value Ref Range   Glucose-Capillary 149 (H) 70 - 99 mg/dL   Comment 1 Notify RN   Glucose, capillary     Status: Abnormal   Collection Time: 10/27/18 11:40 AM  Result Value Ref Range   Glucose-Capillary 181 (H) 70 - 99 mg/dL   Comment 1 Notify RN   Surgical pathology     Status: None   Collection Time: 10/27/18  6:07 PM  Result Value Ref Range   SURGICAL PATHOLOGY      Surgical Pathology CASE: [id number] PATIENT: [name] Surgical Pathology Report     SPECIMEN SUBMITTED: A. Leg, right; below knee amputation  (previous right ankle amputation)  CLINICAL HISTORY: None provided  PRE-OPERATIVE DIAGNOSIS: Necrotic eschar right distal leg stump  POST-OPERATIVE DIAGNOSIS: Same as pre op     DIAGNOSIS: A. LOWER LEG, RIGHT; BELOW-THE-KNEE AMPUTATION: - PREVIOUS AMPUTATION AT RIGHT ANKLE. - ATHEROSCLEROSIS. - CUTANEOUS NECROSIS, ULCERATION, AND CELLULITIS OF DISTAL STUMP. - TIBIAL INTRAMEDULLARY ROD.   GROSS DESCRIPTION: A. Labeled: Right BKA (previous right ankle amputation) Received: Fresh, wrapped in red-biohazard bag Size: 27.5 cm from proximal skin resection margin to previous resection site and ranging from 5.3 cm up to 9.0 cm in width.  Extending above the proximal soft tissue resection margin are 2 segments of bone, tibia measuring 3.5 cm in length by 4.5 x 4.0 cm and fibula 6.7 cm in length by 1.2 x 1.0  cm.  An intramedullary rod extends from the proximal tibial margin for a distance of 1.0 cm. Description of lesion(s): Right leg stump with viable proximal skin and underlying soft tissue and bone margins.  The vascular margin is patent. The previous amputation site is tan-gray, focally necrotic. Sectioning through the lesion reveals a necrotic soft tissue that possibly extends to the underlying bone.  The skin is tan-white.  The anterior and posterior vessels are  patent.  Block summary: A1.  Proximal skin margin and underlying soft tissue A2.  Proximal bone margin (bone marrow) A3.  Vascular margin A4.  Lesion to underlying bone A5.  Anterior and posterior vessels (anterior inked blue)  Tissue decalcification: A1 and A4   Final Diagnosis performed by Bryan Lemma, MD.   Electronically signed 11/01/2018 6:14:04PM The electronic signature indicates that the named Attending Pathologist has evaluated the specimen  Technical component performed at [facility], [address] , [place], [place] [address] Lab: [phone] Dir: Rush Farmer, MD, MMM  Professional component performed at [facility], [address], [place], [place] [address] Lab: [phone] Dir: [name]. Rubinas, MD   Aerobic/Anaerobic Culture (surgical/deep wound)     Status: None   Collection Time: 10/27/18  6:18 PM  Result Value Ref Range   Specimen Description      WOUND BONE MARROW RIGHT LEG Performed at [facility], [address] [place], [place] [address]    Special Requests PATIENT ON FOLLOWING ZOSYN,VANCOMYCIN    Gram Stain NO WBC SEEN NO ORGANISMS SEEN     Culture      No growth aerobically or anaerobically. Performed at [facility], [place] [address], [place], [place] [address]    Report Status 11/02/2018 FINAL   Aerobic/Anaerobic Culture (surgical/deep wound)     Status: None   Collection Time: 10/27/18  6:48 PM  Result Value Ref Range   Specimen Description      WOUND BONE MARROW RIGHT LEG 2 Performed at [facility], [address] [place], [place] [address]    Special Requests PATIENT ON FOLLOWING ZOSYN,VANCOMYCIN    Gram Stain NO WBC SEEN NO ORGANISMS SEEN     Culture      No growth aerobically or anaerobically. Performed at [facility], [place] [address], [place], [place] [address]    Report Status 11/02/2018 FINAL   Aerobic/Anaerobic Culture (surgical/deep wound)     Status: None   Collection  Time: 10/27/18  7:56 PM  Result Value Ref Range   Specimen Description      WOUND RIGHT ANKLE STUMP 3 Performed at [facility], [address] [place], [place] [address]    Special Requests PATIENT ON FOLLOWING ZOSYN,VANCOMYCIN    Gram Stain      FEW WBC PRESENT, PREDOMINANTLY PMN FEW GRAM POSITIVE COCCI IN PAIRS    Culture      FEW ACTINOMYCES NEUII NO ANAEROBES ISOLATED Performed at [facility], [place] [address] [place], [place] [address]    Report Status 11/02/2018 FINAL   Aerobic/Anaerobic Culture (surgical/deep wound)     Status: None   Collection Time: 10/27/18  7:58 PM  Result Value Ref Range   Specimen Description      WOUND RIGHT ANKLE STUMP 4 Performed at [facility], [address] [place],  [address]    Special Requests PATIENT ON FOLLOWING ZOSYN,VANCOMYCIN    Gram Stain      FEW WBC PRESENT, PREDOMINANTLY PMN FEW GRAM POSITIVE  COCCI IN PAIRS    Culture      FEW ACTINOMYCES NEUII NO ANAEROBES ISOLATED Performed at [facility], [place] [address] [place], [place] [address]    Report Status 11/02/2018 FINAL   Glucose, capillary     Status: Abnormal   Collection Time: 10/27/18  8:13 PM  Result Value Ref Range   Glucose-Capillary 158 (H) 70 - 99 mg/dL  Glucose, capillary     Status: Abnormal   Collection Time: 10/27/18  9:29 PM  Result Value Ref Range   Glucose-Capillary 163 (H) 70 - 99 mg/dL  CBC     Status: Abnormal   Collection Time: 10/28/18  3:31 AM  Result Value Ref Range   WBC 7.7 4.0 - 10.5 K/uL   RBC 3.81 (L) 4.22 - 5.81 MIL/uL   Hemoglobin 11.6 (L) 13.0 - 17.0 g/dL   HCT 35.1 (L) 39.0 - 52.0 %   MCV 92.1 80.0 - 100.0 fL   MCH 30.4 26.0 - 34.0 pg   MCHC 33.0 30.0 - 36.0 g/dL   RDW 13.9 11.5 - 15.5 %   Platelets 226 150 - 400 K/uL   nRBC 0.0 0.0 - 0.2 %    Comment: Performed at [facility], [address] [place], [place] [address]  Basic metabolic panel     Status: Abnormal   Collection  Time: 10/28/18  3:31 AM  Result Value Ref Range   Sodium 138 135 - 145 mmol/L   Potassium 4.5 3.5 - 5.1 mmol/L   Chloride 107 98 - 111 mmol/L   CO2 25 22 - 32 mmol/L   Glucose, Bld 243 (H) 70 - 99 mg/dL   BUN 19 8 - 23 mg/dL   Creatinine, Ser 0.85 0.61 - 1.24 mg/dL   Calcium 8.2 (L) 8.9 - 10.3 mg/dL   GFR calc non Af Amer >60 >60 mL/min   GFR calc Af Amer >60 >60 mL/min   Anion gap 6 5 - 15    Comment: Performed at [facility], [address] [place], [place] [address]  Glucose, capillary     Status: Abnormal   Collection Time: 10/28/18  8:06 AM  Result Value Ref Range   Glucose-Capillary 158 (H) 70 - 99 mg/dL   Comment 1 Notify RN   Glucose, capillary     Status: Abnormal   Collection Time: 10/28/18 11:45 AM  Result Value Ref Range   Glucose-Capillary 240 (H) 70 - 99 mg/dL   Comment 1 Notify RN   Glucose, capillary     Status: Abnormal   Collection Time: 10/28/18  4:39 PM  Result Value Ref Range   Glucose-Capillary 218 (H) 70 - 99 mg/dL   Comment 1 Notify RN   Glucose, capillary     Status: Abnormal   Collection Time: 10/28/18  8:59 PM  Result Value Ref Range   Glucose-Capillary 182 (H) 70 - 99 mg/dL  Glucose, capillary     Status: Abnormal   Collection Time: 10/29/18  7:57 AM  Result Value Ref Range   Glucose-Capillary 154 (H) 70 - 99 mg/dL   Comment 1 Notify RN   Glucose, capillary     Status: Abnormal   Collection Time: 10/29/18 11:50 AM  Result Value Ref Range   Glucose-Capillary 174 (H) 70 - 99 mg/dL   Comment 1 Notify RN   Glucose, capillary     Status: Abnormal   Collection Time: 10/29/18  4:56 PM  Result Value Ref Range   Glucose-Capillary  171 (H) 70 - 99 mg/dL  Glucose, capillary     Status: Abnormal   Collection Time: 10/29/18  9:17 PM  Result Value Ref Range   Glucose-Capillary 159 (H) 70 - 99 mg/dL  Glucose, capillary     Status: Abnormal   Collection Time: 10/30/18  7:56 AM  Result Value Ref Range   Glucose-Capillary 190 (H) 70 - 99  mg/dL  Glucose, capillary     Status: Abnormal   Collection Time: 10/30/18 11:54 AM  Result Value Ref Range   Glucose-Capillary 121 (H) 70 - 99 mg/dL  Urine Culture     Status: None   Collection Time: 11/14/18  4:57 PM  Result Value Ref Range   Urine Culture, Routine Final report    Organism ID, Bacteria No growth   Hemoglobin A1c     Status: None   Collection Time: 12/11/18 10:50 AM  Result Value Ref Range   Hgb A1c MFr Bld 5.6 4.8 - 5.6 %    Comment:          Prediabetes: 5.7 - 6.4          Diabetes: >6.4          Glycemic control for adults with diabetes: <7.0    Est. average glucose Bld gHb Est-mCnc 114 mg/dL    Radiology No results found.  Assessment/Plan Benign essential HTN blood pressure control important in reducing the progression of atherosclerotic disease. On appropriate oral medications.   Diabetes blood glucose control important in reducing the progression of atherosclerotic disease. Also, involved in wound healing. On appropriate medications.   HLD (hyperlipidemia) lipid control important in reducing the progression of atherosclerotic disease. 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  In the process of getting his prosthesis.  PAD (peripheral artery disease) ([place]) Had intervention but ultimately had to have an amputation for infection and wound issues.  States that his left leg had poor blood flow prior to all of this.  I told him once everything is healed and he has his prosthesis in the next couple of months, I think we should check his ABI and determine whether or not intervention will be required on the left leg.    Leotis Pain, MD  01/01/2019 8:47 AM    This note was created with Dragon medical transcription system.  Any errors from dictation are purely unintentional

## 2019-01-01 NOTE — Assessment & Plan Note (Signed)
Healed now.  In the process of getting his prosthesis.

## 2019-01-01 NOTE — Chronic Care Management (AMB) (Signed)
  Chronic Care Management   Telephone Outreach Note  01/01/2019 Name: Andrew Lyons MRN: [id number] DOB: 10/17/1945  Referred by: patient's health plan.  Outreach to Mr. Andrew Lyons performed 3//2/20 for a 30 day post discharge medication reconciliation. Mr. Andrew Lyons was subsequently referred to care management team by his health plan from the 2020 quarter 1 high risk list. Given recent outreach 4 weeks ago, additional outreach was not performed at this time.   Patient did not agree to services and does not wish to consider at this time. (11/26/18)  Next PCP appointment scheduled for:  April 15, 2019   [name]., MD has been notified of this outreach and Mr. Andrew Lyons's decision and plan.   Ruben Reason, PharmD Clinical Pharmacist [place] [phone]

## 2019-01-11 DIAGNOSIS — Z89511 Acquired absence of right leg below knee: Secondary | ICD-10-CM | POA: Diagnosis not present

## 2019-01-17 ENCOUNTER — Other Ambulatory Visit: Payer: Self-pay

## 2019-01-17 ENCOUNTER — Telehealth: Payer: Self-pay | Admitting: Family Medicine

## 2019-01-17 MED ORDER — LIRAGLUTIDE 18 MG/3ML ~~LOC~~ SOPN: PEN_INJECTOR | SUBCUTANEOUS | 3 refills | Status: DC

## 2019-01-17 NOTE — Telephone Encounter (Signed)
Pt called saying he needs a refill on the Victoza 18 mg/3 ml. to inject 1.8 every day.  He said it was 600.00 for a 90 day supply.  He wants to know if we have any samples  He said if not just do a 30 days supply so it wont be so expensive.  Thanks [employer]

## 2019-01-17 NOTE — Telephone Encounter (Signed)
No samples available.  Sent rx to pharmacy

## 2019-01-24 ENCOUNTER — Other Ambulatory Visit: Payer: Self-pay | Admitting: Family Medicine

## 2019-01-24 DIAGNOSIS — E118 Type 2 diabetes mellitus with unspecified complications: Secondary | ICD-10-CM
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
## 2019-02-05 ENCOUNTER — Ambulatory Visit (INDEPENDENT_AMBULATORY_CARE_PROVIDER_SITE_OTHER): Payer: PPO

## 2019-02-05 ENCOUNTER — Other Ambulatory Visit: Payer: Self-pay

## 2019-02-05 ENCOUNTER — Ambulatory Visit: Payer: Self-pay | Admitting: Pharmacist

## 2019-02-05 ENCOUNTER — Encounter (INDEPENDENT_AMBULATORY_CARE_PROVIDER_SITE_OTHER): Payer: Self-pay | Admitting: Nurse Practitioner

## 2019-02-05 ENCOUNTER — Ambulatory Visit (INDEPENDENT_AMBULATORY_CARE_PROVIDER_SITE_OTHER): Payer: PPO | Admitting: Nurse Practitioner

## 2019-02-05 VITALS — BP 153/70 | HR 73 | Resp 16 | Ht 70.0 in | Wt 237.4 lb

## 2019-02-05 DIAGNOSIS — E1159 Type 2 diabetes mellitus with other circulatory complications: Secondary | ICD-10-CM | POA: Diagnosis not present

## 2019-02-05 DIAGNOSIS — Z89511 Acquired absence of right leg below knee: Secondary | ICD-10-CM

## 2019-02-05 DIAGNOSIS — E7800 Pure hypercholesterolemia, unspecified: Secondary | ICD-10-CM

## 2019-02-05 DIAGNOSIS — Z7984 Long term (current) use of oral hypoglycemic drugs: Secondary | ICD-10-CM

## 2019-02-05 DIAGNOSIS — Z87891 Personal history of nicotine dependence: Secondary | ICD-10-CM

## 2019-02-05 DIAGNOSIS — I739 Peripheral vascular disease, unspecified: Secondary | ICD-10-CM

## 2019-02-05 DIAGNOSIS — Z79899 Other long term (current) drug therapy: Secondary | ICD-10-CM

## 2019-02-05 NOTE — Progress Notes (Signed)
SUBJECTIVE:  Patient ID: Andrew Lyons, male    DOB: Nov 20, 1945, 73 y.o.   MRN: [id number] Chief Complaint  Patient presents with  . Follow-up    37monthultrasound follow up    HPI  Andrew VANDEVOORDEis a 73y.o. male that presents today to evaluate his peripheral artery disease.  The patient has a previous history of a right below-knee amputation.  He was told that prior to his amputation he had decreased blood flow in his left leg.  He is concerned that an intervention may be necessary.  The patient denies any claudication-like symptoms.  He denies any rest pain.  He denies any wounds on his left lower extremity.  He denies any chest pain or shortness of breath.  Currently the patient is walking with his right prosthesis without issue.  Noninvasive studies today show a left ABI of 1.09 with a TBI of 0.75.  He has triphasic waveforms within his tibial arteries with strong toe waveforms.  Past Medical History:  Diagnosis Date  . 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 Barrett's esophagus with dysplasia   . COPD (chronic obstructive pulmonary disease) ([facility]   . Coronary artery disease   . Diabetes mellitus without complication ([facility] [place]   . GERD (gastroesophageal reflux disease)   . Hepatitis   . Hyperlipidemia   . Hypertension   . Myocardial infarction ([facility]   . Seizures ([place]   . Sleep apnea     Past Surgical History:  Procedure Laterality Date  . ABDOMINAL AORTOGRAM W/LOWER EXTREMITY N/A 06/20/2018   Procedure: ABDOMINAL AORTOGRAM W/LOWER EXTREMITY;  Surgeon: AWellington Hampshire MD;  [facility];  Service: Cardiovascular;  Laterality: N/A;  . AMPUTATION Right 10/27/2018   Procedure: AMPUTATION BELOW KNEE;  Surgeon: PLeim Fabry MD;  Location: [facility];  Service: Orthopedics;  Laterality: Right;  . CARDIAC CATHETERIZATION     with Angioplasty  . cardiac stents    . CARDIAC VALVE REPLACEMENT     Aortic Valve Replacement  . COLONOSCOPY WITH  PROPOFOL N/A 03/18/2015   Procedure: COLONOSCOPY WITH PROPOFOL;  Surgeon: RManya Silvas MD;  [facility];  Service: Endoscopy;  Laterality: N/A;  . ESOPHAGOGASTRODUODENOSCOPY N/A 03/18/2015   Procedure: ESOPHAGOGASTRODUODENOSCOPY (EGD);  Surgeon: RManya Silvas MD;  [facility];  Service: Endoscopy;  Laterality: N/A;  . FOOT AMPUTATION    . GREEN LIGHT LASER TURP (TRANSURETHRAL RESECTION OF PROSTATE N/A 10/16/2018   Procedure: GREEN LIGHT LASER TURP (TRANSURETHRAL RESECTION OF PROSTATE;  Surgeon: WRoyston Cowper MD;  Location: [facility];  Service: Urology;  Laterality: N/A;  . HARDWARE REMOVAL Right 10/27/2018   Procedure: HARDWARE REMOVAL;  Surgeon: PLeim Fabry MD;  Location: [facility];  Service: Orthopedics;  Laterality: Right;  . LOWER EXTREMITY ANGIOGRAPHY Right 10/25/2018   Procedure: Lower Extremity Angiography;  Surgeon: DAlgernon Huxley MD;  [facility];  Service: Cardiovascular;  Laterality: Right;  . PERIPHERAL VASCULAR INTERVENTION Right 06/20/2018   Procedure: PERIPHERAL VASCULAR INTERVENTION;  Surgeon: AWellington Hampshire MD;  [facility];  Service: Cardiovascular;  Laterality: Right;  . THORACOTOMY Right   . TONSILLECTOMY      Social History   Socioeconomic History  . Marital status: Married    Spouse name: Not on file  . Number of children: 2  . Years of education: Not on file  . Highest education level: 8th grade  Occupational History  . Occupation: retired  Social Needs  . Financial resource strain: Not hard at all  . Food insecurity:    Worry: Never true    Inability: Never true  . Transportation needs:    Medical: No    Non-medical: No  Tobacco Use  . Smoking status: Former Smoker    Last attempt to quit: 10/02/2001    Years since quitting: 17.3  . Smokeless tobacco: Former Systems developer  . Tobacco comment: Quit smoking in 2003; Started smoking at age 67, smoked about 40 years, smoked over 3 packs per day   Substance and Sexual Activity  . Alcohol use: Not Currently  . Drug use: No  . Sexual activity: Not on file  Lifestyle  . Physical activity:    Days per week: 0 days    Minutes per session: 0 min  . Stress: Not at all  Relationships  . Social connections:    Talks on phone: Patient refused    Gets together: Patient refused    Attends religious service: Patient refused    Active member of club or organization: Patient refused    Attends meetings of clubs or organizations: Patient refused    Relationship status: Patient refused  . Intimate partner violence:    Fear of current or ex partner: Not on file    Emotionally abused: Not on file    Physically abused: Not on file    Forced sexual activity: Not on file  Other Topics Concern  . Not on file  Social History Narrative  . Not on file    Family History  Problem Relation Age of Onset  . Obesity Son   . Diabetes Brother   . Hypertension Brother   . Heart attack Mother   . Heart attack Father     Allergies  Allergen Reactions  . Iodinated Diagnostic Agents Rash    had a red chest -unusre of what kind of dye it was  . Zyban [Bupropion] Rash  . Zocor [Simvastatin] Other (See Comments)    Reaction: hepatitis   . Gadolinium Derivatives Rash     Review of Systems   Review of Systems: Negative Unless Checked Constitutional: '[]'$ Weight loss  '[]'$ Fever  '[]'$ Chills Cardiac: '[]'$ Chest pain   '[]'$  Atrial Fibrillation  '[]'$ Palpitations   '[]'$ Shortness of breath when laying flat   '[]'$ Shortness of breath with exertion. '[]'$ Shortness of breath at rest Vascular:  '[]'$ Pain in legs with walking   '[]'$ Pain in legs with standing '[]'$ Pain in legs when laying flat   '[]'$ Claudication    '[]'$ Pain in feet when laying flat    '[]'$ History of DVT   '[]'$ Phlebitis   '[]'$ Swelling in legs   '[]'$ Varicose veins   '[]'$ Non-healing ulcers Pulmonary:   '[]'$ Uses home oxygen   '[]'$ Productive cough   '[]'$ Hemoptysis   '[]'$ Wheeze  '[x]'$ COPD   '[]'$ Asthma Neurologic:  '[]'$ Dizziness   '[x]'$ Seizures  '[]'$ Blackouts  '[]'$ History of stroke   '[]'$ History of TIA  '[]'$ Aphasia   '[]'$ Temporary Blindness   '[]'$ Weakness or numbness in arm   '[]'$ Weakness or numbness in leg Musculoskeletal:   '[]'$ Joint swelling   '[]'$ Joint pain   '[]'$ Low back pain  '[]'$  History of Knee Replacement '[]'$ Arthritis '[]'$ back Surgeries  '[]'$  Spinal Stenosis    Hematologic:  '[]'$ Easy bruising  '[]'$ Easy bleeding   '[]'$ Hypercoagulable state   '[]'$ Anemic Gastrointestinal:  '[]'$ Diarrhea   '[]'$ Vomiting  '[x]'$ Gastroesophageal reflux/heartburn   '[]'$ Difficulty swallowing. '[]'$ Abdominal pain Genitourinary:  '[]'$ Chronic kidney disease   '[]'$ Difficult urination  '[]'$ Anuric   '[]'$ Blood in urine '[]'$ Frequent urination  '[]'$ Burning with urination   '[]'$   Hematuria Skin:  '[]'$ Rashes   '[]'$ Ulcers '[]'$ Wounds Psychological:  '[]'$ History of anxiety   '[]'$  History of major depression  '[]'$  Memory Difficulties      OBJECTIVE:   Physical Exam  BP (!) 153/70 (BP Location: Right Arm)   Pulse 73   Resp 16   Ht '5\' 10"'$  (1.778 m)   Wt 237 lb 6.4 oz (107.7 kg)   BMI 34.06 kg/m   Gen: WD/WN, NAD Head: [place]/AT, No temporalis wasting.  Ear/Nose/Throat: Hearing grossly intact, nares w/o erythema or drainage Eyes: PER, EOMI, sclera nonicteric.  Neck: Supple, no masses.  No JVD.  Pulmonary:  Good air movement, no use of accessory muscles.  Cardiac: RRR Vascular:  Vessel Right Left  Radial Palpable Palpable  Dorsalis Pedis  Palpable  Posterior Tibial  Palpable   Gastrointestinal: soft, non-distended. No guarding/no peritoneal signs.  Musculoskeletal: M/S 5/5 throughout.    Right below-knee amputation. Neurologic: Pain and light touch intact in extremities.  Symmetrical.  Speech is fluent. Motor exam as listed above. Psychiatric: Judgment intact, Mood & affect appropriate for pt's clinical situation. Dermatologic: No Venous rashes. No Ulcers Noted.  No changes consistent with cellulitis. Lymph : No Cervical lymphadenopathy, no lichenification or skin changes of chronic lymphedema.       ASSESSMENT AND PLAN:  1. PAD  (peripheral artery disease) (HCC) Noninvasive studies today show a left ABI of 1.09 with a TBI of 0.75.  He has triphasic waveforms within his tibial arteries with strong toe waveforms.  Due to the patient's amputation we will keep a close eye on his peripheral artery disease to possibly intervene earlier if necessary.  We will recheck ABIs in 6 months.  Patient advised to call us sooner if he notices that he is beginning to have claudication-like symptoms, rest pain, or wounds of the lower extremities. - VAS [place] ABI WITH/WO TBI; Future  2. Type 2 diabetes mellitus with other circulatory complication, without long-term current use of insulin (HCC) Continue hypoglycemic medications as already ordered, these medications have been reviewed and there are no changes at this time.  Hgb A1C to be monitored as already arranged by primary service   3. Pure hypercholesterolemia Continue statin as ordered and reviewed, no changes at this time    Current Outpatient Medications on File Prior to Visit  Medication Sig Dispense Refill  . amLODipine-benazepril (LOTREL) 5-40 MG capsule TAKE 1 CAPSULE BY MOUTH EVERY DAY (Patient taking differently: Take 1 capsule by mouth daily. ) 90 capsule 3  . aspirin EC 81 MG tablet Take 1 tablet (81 mg total) by mouth daily. 30 tablet 2  . B-D UF III MINI PEN NEEDLES 31G X 5 MM MISC USE WITH PEN DAILY 100 each 3  . Blood Glucose Monitoring Suppl (ONE TOUCH ULTRA SYSTEM KIT) w/Device KIT To use daily to check sugar. DX E11.9-needs one touch ultra meter 1 each 0  . cholecalciferol (VITAMIN D) 25 MCG (1000 UT) tablet Take 1,000 Units by mouth daily.     . clopidogrel (PLAVIX) 75 MG tablet Take 1 tablet (75 mg total) by mouth daily. 30 tablet 11  . Coenzyme Q10 100 MG TABS Take 100 mg by mouth daily.     . fluticasone (FLONASE) 50 MCG/ACT nasal spray USE 2 SPRAYS IN EACH NOSTRIL EVERY DAY (Patient taking differently: Place 2 sprays into both nostrils daily. ) 48 g 3  .  JARDIANCE 25 MG TABS tablet TAKE 1 TABLET BY MOUTH DAILY 30 tablet 10  . Lancets [facility]  ULTRASOFT) lancets USE 1  TO CHECK GLUCOSE THREE TIMES DAILY 100 each 0  . liraglutide (VICTOZA) 18 MG/3ML SOPN INJECT 1.8 SUBCUTANEOUSLY EVERY DAY 9 pen 3  . metFORMIN (GLUCOPHAGE) 500 MG tablet TAKE 1 TABLET (500 MG TOTAL) BY MOUTH 2 (TWO) TIMES DAILY WITH A MEAL. 180 tablet 3  . metoprolol succinate (TOPROL-XL) 100 MG 24 hr tablet TAKE 1 TABLET BY MOUTH EVERY DAY 90 tablet 3  . montelukast (SINGULAIR) 10 MG tablet TAKE 1 TABLET BY MOUTH EVERY DAY 90 tablet 3  . omeprazole (PRILOSEC) 40 MG capsule TAKE 1 CAPSULE BY MOUTH EVERY DAY 90 capsule 3  . ONE TOUCH ULTRA TEST test strip USE 1 STRIP TO CHECK GLUCOSE TWICE DAILY OR AS NEEDED 100 each 0  . ONE TOUCH ULTRA TEST test strip USE 1 STRIP TO CHECK GLUCOSE THREE TIMES DAILY 100 each 0  . rosuvastatin (CRESTOR) 20 MG tablet TAKE 1 TABLET BY MOUTH EVERY DAY (Patient taking differently: Take 20 mg by mouth daily. ) 90 tablet 2  . sildenafil (VIAGRA) 100 MG tablet Take 100 mg by mouth as needed for erectile dysfunction.     . vitamin B-12 (CYANOCOBALAMIN) 1000 MCG tablet Take 1,000 mcg by mouth daily.    Marland Kitchen oxyCODONE (OXY IR/ROXICODONE) 5 MG immediate release tablet Take 1 tablet (5 mg total) by mouth every 6 (six) hours as needed for moderate pain. (Patient not taking: Reported on 12/11/2018) 16 tablet 0   No current facility-administered medications on file prior to visit.     There are no Patient Instructions on file for this visit. Return in about 6 months (around 08/08/2019) for PAD .   Kris Hartmann, NP  This note was completed with Sales executive.  Any errors are purely unintentional.

## 2019-02-05 NOTE — Chronic Care Management (AMB) (Signed)
Chronic Care Management   Note  02/05/2019 Name: Andrew Lyons MRN: [id number] DOB: 18-Sep-1946  Andrew Lyons is a 73 y.o. year old male who is a primary care patient of [name]., MD. I reached out to Andrew Lyons by phone today in response to a referral sent by Andrew Lyons health plan.    [name] was given information about Chronic Care Management services today including:  1. CCM service includes personalized support from designated clinical staff supervised by his physician, including individualized plan of care and coordination with other care providers 2. 24/7 contact phone numbers for assistance for urgent and routine care needs. 3. Service will only be billed when office clinical staff spend 20 minutes or more in a month to coordinate care. 4. Only one practitioner may furnish and bill the service in a calendar month. 5. The patient may stop CCM services at any time (effective at the end of the month) by phone call to the office staff. 6. The patient will be responsible for cost sharing (co-pay) of up to 20% of the service fee (after annual deductible is met).  Patient agreed to services and verbal consent obtained.   Follow up plan: Telephone appointment with CCM team member scheduled for:02/11/2019  [place]  ??[email or web address]   ??[phone]

## 2019-02-11 ENCOUNTER — Other Ambulatory Visit: Payer: Self-pay | Admitting: Family Medicine

## 2019-02-11 ENCOUNTER — Ambulatory Visit (INDEPENDENT_AMBULATORY_CARE_PROVIDER_SITE_OTHER): Payer: PPO | Admitting: Pharmacist

## 2019-02-11 DIAGNOSIS — E7800 Pure hypercholesterolemia, unspecified: Secondary | ICD-10-CM

## 2019-02-11 DIAGNOSIS — E1159 Type 2 diabetes mellitus with other circulatory complications: Secondary | ICD-10-CM | POA: Diagnosis not present

## 2019-02-11 DIAGNOSIS — Z6838 Body mass index (BMI) 38.0-38.9, adult: Secondary | ICD-10-CM

## 2019-02-11 DIAGNOSIS — G4733 Obstructive sleep apnea (adult) (pediatric): Secondary | ICD-10-CM

## 2019-02-11 NOTE — Telephone Encounter (Signed)
Please review

## 2019-02-11 NOTE — Chronic Care Management (AMB) (Signed)
Chronic Care Management   Note  02/18/2019 Name: Andrew Lyons MRN: [id number] DOB: [birth year]  Subjective:   Does the patient  feel that his/her medications are working for him/her?  yes  Has the patient been experiencing any side effects to the medications prescribed?  no  Does the patient measure his/her own blood glucose at home?  yes   Does the patient measure his/her own blood pressure at home? yes   Does the patient have any problems obtaining medications due to transportation or finances?   yes  Understanding of regimen: good Understanding of indications: excellent Potential of compliance: good  Objective: Lab Results  Component Value Date   CREATININE 0.85 10/28/2018   CREATININE 1.08 10/27/2018   CREATININE 1.14 10/24/2018    Lab Results  Component Value Date   HGBA1C 5.6 12/11/2018    Lipid Panel     Component Value Date/Time   CHOL 133 08/22/2018 0756   TRIG 174 (H) 08/22/2018 0756   HDL 33 (L) 08/22/2018 0756   CHOLHDL 4.0 08/22/2018 0756   CHOLHDL 4.2 09/21/2017 0825   LDLCALC 65 08/22/2018 0756   LDLCALC 71 09/21/2017 0825    BP Readings from Last 3 Encounters:  02/05/19 (!) 153/70  01/01/19 132/71  12/11/18 122/62    Allergies  Allergen Reactions  . Iodinated Diagnostic Agents Rash    had a red chest -unusre of what kind of dye it was  . Zyban [Bupropion] Rash  . Zocor [Simvastatin] Other (See Comments)    Reaction: hepatitis   . Gadolinium Derivatives Rash    Medications Reviewed Today    Reviewed by Andrew Lyons, [facility] (Pharmacist) on 02/11/19 at [place] List Status: <None>  Medication Order Taking? Sig Documenting Provider Last Dose Status Informant  amLODipine-benazepril (LOTREL) 5-40 MG capsule [id number] Yes TAKE 1 CAPSULE BY MOUTH EVERY DAY  Patient taking differently:  Take 1 capsule by mouth daily.    Andrew Lyons Taking Active Self  aspirin EC 81 MG tablet [id number] Yes Take 1 tablet (81 mg total) by  mouth daily. Andrew Hampshire, Lyons Taking Active Self  B-D UF III MINI PEN NEEDLES 31G X 5 MM MISC [id number] Yes USE WITH PEN DAILY Andrew Lyons Taking Active Self  Blood Glucose Monitoring Suppl (ONE TOUCH ULTRA SYSTEM KIT) w/Device KIT [id number] Yes To use daily to check sugar. DX E11.9-needs one touch ultra meter Andrew Lyons Taking Active Self  cholecalciferol (VITAMIN D) 25 MCG (1000 UT) tablet [id number] Yes Take 1,000 Units by mouth daily.  Provider, Historical, Lyons Taking Active Self  clopidogrel (PLAVIX) 75 MG tablet [id number] Yes Take 1 tablet (75 mg total) by mouth daily. Andrew Lyons Taking Active   Coenzyme Q10 100 MG TABS [id number] Yes Take 100 mg by mouth daily.  Provider, Historical, Lyons Taking Active Self  fluticasone (FLONASE) 50 MCG/ACT nasal spray [id number] Yes USE 2 SPRAYS IN EACH NOSTRIL EVERY DAY  Patient taking differently:  Place 2 sprays into both nostrils daily.    Andrew Lyons Taking Active Self  JARDIANCE 25 MG TABS tablet [id number] Yes TAKE 1 TABLET BY MOUTH DAILY Andrew Lyons Taking Active Self  Lancets [facility] ULTRASOFT) lancets [id number] Yes USE 1  TO CHECK GLUCOSE THREE TIMES DAILY Andrew Lyons Taking Active   liraglutide [facility]) 18 MG/3ML SOPN [id number] Yes INJECT 1.8 SUBCUTANEOUSLY EVERY DAY Andrew Lyons.,  Lyons Taking Active   metFORMIN (GLUCOPHAGE) 500 MG tablet [id number] Yes TAKE 1 TABLET (500 MG TOTAL) BY MOUTH 2 (TWO) TIMES DAILY WITH A MEAL. Andrew Lyons Taking Active   metoprolol succinate (TOPROL-XL) 100 MG 24 hr tablet [id number] Yes TAKE 1 TABLET BY MOUTH EVERY DAY Andrew Lyons Taking Active   montelukast (SINGULAIR) 10 MG tablet [id number] Yes TAKE 1 TABLET BY MOUTH EVERY DAY Andrew Lyons Taking Active   omeprazole (PRILOSEC) 40 MG capsule [id number] Yes TAKE 1 CAPSULE BY MOUTH EVERY DAY Andrew Lyons Taking Active             Med Note Andrew Lyons, Andrew Lyons Feb 11, 2019  9:25 AM) Interested in coming off  [place]al TEST test strip [id number] Yes USE 1 STRIP TO CHECK GLUCOSE THREE TIMES DAILY Andrew Lyons Taking Active   oxyCODONE (OXY IR/ROXICODONE) 5 MG immediate release tablet [id number] No Take 1 tablet (5 mg total) by mouth every 6 (six) hours as needed for moderate pain.  Patient not taking:  Reported on 12/11/2018   Andrew Loll, Lyons Not Taking Active   rosuvastatin (CRESTOR) 20 MG tablet [id number] Yes TAKE 1 TABLET BY MOUTH EVERY DAY  Patient taking differently:  Take 20 mg by mouth daily.    Andrew Lyons Taking Active Self  sildenafil (VIAGRA) 100 MG tablet [id number]  Take 100 mg by mouth as needed for erectile dysfunction.  Provider, Historical, Lyons  Active Self  vitamin B-12 (CYANOCOBALAMIN) 1000 MCG tablet [id number] Yes Take 1,000 mcg by mouth daily. Provider, Historical, Lyons Taking Active Self           Assessment:    Goals Addressed            This Visit's Progress   . I would like to come off of my Prilosec (pt-stated)       Current Barriers:  Pill burden  Pharmacist Clinical Goal(s):  Marland Kitchen Over the next 30 days, patient and pharmacist will work together to develop a taper to come off of omeprazole safely and with minimal side effects as reported by patient.   Interventions: . Reviewed use, side effects, and adverse events of omeprazole . Developed taper for DC omeprazole  Patient Self Care Activities:  . Self administers medications as prescribed  Initial goal documentation     . Medication assistance (pt-stated)       Current Barriers:  . financial  Pharmacist Clinical Goal(s): Over the next 14 days, Mr.. Trim will provide the necessary supplementary documents (proof of out of pocket prescription expenditure, proof of household income) needed for medication assistance applications to CCM pharmacist.   Interventions: . CCM pharmacist  will apply for medication assistance program for Victoza made by [employer], and Jardiance made by [employer].    Patient Self Care Activities:  Marland Kitchen Gather necessary documents needed to apply for medication assistance  Initial goal documentation         Plan: Recommendations discussed with provider - DC omeprazole  Recommendations discussed with patient - Omeprazole taper (pending approval from Dr. Rosanna Randy)  Follow up: Telephone follow up appointment with CCM team member scheduled for: 2 weeks with PharmD   Ruben Reason, PharmD Clinical Pharmacist [place] [phone]

## 2019-02-18 NOTE — Patient Instructions (Signed)
Goals Addressed            This Visit's Progress   . I would like to come off of my Prilosec (pt-stated)       Current Barriers:  Pill burden  Pharmacist Clinical Goal(s):  Andrew Kitchen Over the next 30 days, patient and pharmacist will work together to develop a taper to come off of omeprazole safely and with minimal side effects as reported by patient.   Interventions: . Reviewed use, side effects, and adverse events of omeprazole . Developed taper for DC omeprazole  Patient Self Care Activities:  . Self administers medications as prescribed  Initial goal documentation     . Medication assistance (pt-stated)       Current Barriers:  . financial  Pharmacist Clinical Goal(s): Over the next 14 days, [name] will provide the necessary supplementary documents (proof of out of pocket prescription expenditure, proof of household income) needed for medication assistance applications to CCM pharmacist.   Interventions: . CCM pharmacist will apply for medication assistance program for Victoza made by [employer], and Jardiance made by [employer].    Patient Self Care Activities:  Andrew Kitchen Gather necessary documents needed to apply for medication assistance  Initial goal documentation          Thank you allowing the Chronic Care Management Team to be a part of your care!   The patient verbalized understanding of instructions provided today and declined a print copy of patient instruction materials.

## 2019-02-25 ENCOUNTER — Ambulatory Visit: Payer: Self-pay | Admitting: Pharmacist

## 2019-02-25 ENCOUNTER — Telehealth: Payer: Self-pay | Admitting: Pharmacist

## 2019-02-25 DIAGNOSIS — K22710 Barrett's esophagus with low grade dysplasia: Secondary | ICD-10-CM

## 2019-02-25 NOTE — Telephone Encounter (Signed)
Good by me--thanks

## 2019-02-25 NOTE — Telephone Encounter (Signed)
Dr. Rosanna Randy-   Interested in tapering off omeprazole 40mg  daily. Can we taper down to omeprazole 20mg  daily x 4 weeks and then omeprazole 20mg  AOD x 4 weeks? (quantity #42 capsules for 56 days supply).   Thank you!   Ruben Reason, PharmD Clinical Pharmacist [place] [phone]

## 2019-03-04 DIAGNOSIS — M7541 Impingement syndrome of right shoulder: Secondary | ICD-10-CM | POA: Diagnosis not present

## 2019-03-05 ENCOUNTER — Ambulatory Visit (INDEPENDENT_AMBULATORY_CARE_PROVIDER_SITE_OTHER): Payer: PPO | Admitting: Vascular Surgery

## 2019-03-05 ENCOUNTER — Encounter (INDEPENDENT_AMBULATORY_CARE_PROVIDER_SITE_OTHER): Payer: PPO

## 2019-03-08 NOTE — Chronic Care Management (AMB) (Signed)
  Chronic Care Management   Follow Up Note   03/08/2019- LATE ENTRY Name: Andrew Lyons MRN: [id number] DOB: 1946-07-01  Referred by: [name]., MD Reason for referral : Chronic Care Management   Andrew Lyons is a 73 y.o. year old male who is a primary care patient of [name]., MD. The CCM pharmacist following up with provider and patient regarding PPI taper.    Review of patient status, including review of consultants reports, relevant laboratory and other test results, and collaboration with appropriate care team members and the patient's provider was performed as part of comprehensive patient evaluation and provision of chronic care management services.    Goals Addressed            This Visit's Progress   . I would like to come off of my Prilosec (pt-stated)       Current Barriers:  Pill burden  Pharmacist Clinical Goal(s):  Marland Kitchen Over the next 30 days, patient and pharmacist will work together to develop a taper to come off of omeprazole safely and with minimal side effects as reported by patient.   Interventions: . Reviewed use, side effects, and adverse events of omeprazole . Developed taper for DC omeprazole; approved taper with Dr. Rosanna Randy  Patient Self Care Activities:  . Self administers medications as prescribed  Initial goal documentation         Telephone follow up appointment with care management team member scheduled for: 2 weeks regarding medication assistance paperwork    Ruben Reason, PharmD Clinical Pharmacist [place] [phone]

## 2019-03-11 ENCOUNTER — Ambulatory Visit (INDEPENDENT_AMBULATORY_CARE_PROVIDER_SITE_OTHER): Payer: PPO | Admitting: Pharmacist

## 2019-03-11 DIAGNOSIS — E1159 Type 2 diabetes mellitus with other circulatory complications: Secondary | ICD-10-CM

## 2019-03-11 DIAGNOSIS — M7541 Impingement syndrome of right shoulder: Secondary | ICD-10-CM | POA: Diagnosis not present

## 2019-03-14 NOTE — Chronic Care Management (AMB) (Signed)
  Chronic Care Management   Follow Up Note   03/14/2019 Name: Andrew Lyons MRN: [id number] DOB: 1945/10/12  Referred by: [name]., MD Lyons for referral : Chronic Care Management (2 week pharmacy follow up)   Andrew Lyons is a 73 y.o. year old male who is a primary care patient of [name]., MD. The CCM clinical pharmacist is following up today for medication assistance applications.   Review of patient status, including review of consultants reports, relevant laboratory and other test results, and collaboration with appropriate care team members and the patient's provider was performed as part of comprehensive patient evaluation and provision of chronic care management services.    Goals Addressed            This Visit's Progress   . I would like to come off of my Prilosec (pt-stated)   On track    Current Barriers:  Pill burden  Pharmacist Clinical Goal(s):  Marland Kitchen Over the next 30 days, patient and pharmacist will work together to develop a taper to come off of omeprazole safely and with minimal side effects as reported by patient.   Interventions: . Reviewed use, side effects, and adverse events of omeprazole . Developed taper for DC omeprazole; approved taper with Dr. Rosanna Randy  Patient Self Care Activities:  . Self administers medications as prescribed  Initial goal documentation     . Medication assistance (pt-stated)   Not on track    Current Barriers:  . financial  Pharmacist Clinical Goal(s): Over the next 14 days, [name] will provide the necessary supplementary documents (proof of out of pocket prescription expenditure, proof of household income) needed for medication assistance applications to CCM pharmacist.   Interventions: . CCM pharmacist will apply for medication assistance program for Victoza made by [employer], and Jardiance made by [employer].    Patient Self Care Activities:  Marland Kitchen Gather necessary  documents needed to apply for medication assistance  Initial goal documentation         Plan:  Andrew Lyons states that he is continuing to work on applications. He is having difficulty right now due to ongoing shoulder pain. Received steroid injection (raising BG). Going to PT. Provided active listening and support.   Provided Dr. Rosanna Randy with prescriber portion of applications.   Follow up: Telephone follow up appointment with care management team member scheduled for: 2 weeks with PharmD   Andrew Lyons, PharmD Clinical Pharmacist [place] [phone]

## 2019-03-15 DIAGNOSIS — M25611 Stiffness of right shoulder, not elsewhere classified: Secondary | ICD-10-CM | POA: Diagnosis not present

## 2019-03-15 DIAGNOSIS — M25511 Pain in right shoulder: Secondary | ICD-10-CM | POA: Diagnosis not present

## 2019-03-18 ENCOUNTER — Telehealth: Payer: Self-pay | Admitting: Family Medicine

## 2019-03-18 NOTE — Telephone Encounter (Signed)
Patient is asking for samples of Victoza

## 2019-03-22 DIAGNOSIS — M25611 Stiffness of right shoulder, not elsewhere classified: Secondary | ICD-10-CM | POA: Diagnosis not present

## 2019-03-22 DIAGNOSIS — M25511 Pain in right shoulder: Secondary | ICD-10-CM | POA: Diagnosis not present

## 2019-03-22 NOTE — Telephone Encounter (Signed)
Ok if we have any.

## 2019-03-25 ENCOUNTER — Ambulatory Visit: Payer: Self-pay | Admitting: Pharmacist

## 2019-03-25 DIAGNOSIS — E1159 Type 2 diabetes mellitus with other circulatory complications: Secondary | ICD-10-CM

## 2019-03-25 NOTE — Chronic Care Management (AMB) (Signed)
  Chronic Care Management   Follow Up Note   03/25/2019 Name: Andrew Lyons MRN: [id number] DOB: August 29, 1946  Subjective Andrew Lyons is a 73 y.o. year old male who is a primary care patient of [name]., MD. The CCM clinical pharmacist following up today on Victoza medication assistance. Noted patient requested samples 03/18/19. HIPAA identifiers verified.  Assessment [name] states that his wife mailed his application last week. He is not sure if she mailed it to the office or to [employer]. He had to purchase Victoza yesterday for >$400.   He is continuing to have shoulder pain as addressed in last note 03/21/19. He has received steroid shots in the shoulder and is receiving PT. He is interested in other therapies mentioned by his orthopedist that would require him coming off of Plavix. He knows at one point he was only supposed to be on Plavix for 1 year.   Goals Addressed            This Visit's Progress   . Medication assistance (pt-stated)   Not on track    Current Barriers:  . financial  Pharmacist Clinical Goal(s): Over the next 14 days, Mr.. Basher will provide the necessary supplementary documents (proof of out of pocket prescription expenditure, proof of household income) needed for medication assistance applications to CCM pharmacist.   Interventions: . CCM pharmacist will apply for medication assistance program for Victoza made by [employer], and Jardiance made by [employer].    Patient Self Care Activities:  Marland Kitchen Gather necessary documents needed to apply for medication assistance  Initial goal documentation         Follow up: Telephone follow up appointment with care management team member scheduled for: 21 days with PharmD or sooner pending communication from [place], PharmD Clinical Pharmacist [place] [phone]

## 2019-03-26 NOTE — Telephone Encounter (Signed)
Patient advised that we have no samples in at this time. KW

## 2019-03-27 DIAGNOSIS — M25511 Pain in right shoulder: Secondary | ICD-10-CM | POA: Diagnosis not present

## 2019-03-27 DIAGNOSIS — M25611 Stiffness of right shoulder, not elsewhere classified: Secondary | ICD-10-CM | POA: Diagnosis not present

## 2019-04-05 DIAGNOSIS — M25511 Pain in right shoulder: Secondary | ICD-10-CM | POA: Diagnosis not present

## 2019-04-07 ENCOUNTER — Other Ambulatory Visit: Payer: Self-pay | Admitting: Family Medicine

## 2019-04-08 DIAGNOSIS — Z952 Presence of prosthetic heart valve: Secondary | ICD-10-CM | POA: Diagnosis not present

## 2019-04-08 DIAGNOSIS — I10 Essential (primary) hypertension: Secondary | ICD-10-CM | POA: Diagnosis not present

## 2019-04-08 DIAGNOSIS — I2510 Atherosclerotic heart disease of native coronary artery without angina pectoris: Secondary | ICD-10-CM | POA: Diagnosis not present

## 2019-04-08 DIAGNOSIS — Q231 Congenital insufficiency of aortic valve: Secondary | ICD-10-CM | POA: Diagnosis not present

## 2019-04-08 DIAGNOSIS — E782 Mixed hyperlipidemia: Secondary | ICD-10-CM | POA: Diagnosis not present

## 2019-04-08 DIAGNOSIS — G4733 Obstructive sleep apnea (adult) (pediatric): Secondary | ICD-10-CM | POA: Diagnosis not present

## 2019-04-10 DIAGNOSIS — M25611 Stiffness of right shoulder, not elsewhere classified: Secondary | ICD-10-CM | POA: Diagnosis not present

## 2019-04-10 DIAGNOSIS — M25511 Pain in right shoulder: Secondary | ICD-10-CM | POA: Diagnosis not present

## 2019-04-15 ENCOUNTER — Encounter: Payer: Self-pay | Admitting: Family Medicine

## 2019-04-15 ENCOUNTER — Ambulatory Visit (INDEPENDENT_AMBULATORY_CARE_PROVIDER_SITE_OTHER): Payer: PPO | Admitting: Family Medicine

## 2019-04-15 ENCOUNTER — Other Ambulatory Visit: Payer: Self-pay

## 2019-04-15 VITALS — BP 118/72 | HR 72 | Temp 98.5°F | Resp 16 | Ht 70.0 in | Wt 238.0 lb

## 2019-04-15 DIAGNOSIS — E1159 Type 2 diabetes mellitus with other circulatory complications: Secondary | ICD-10-CM | POA: Diagnosis not present

## 2019-04-15 DIAGNOSIS — Z6838 Body mass index (BMI) 38.0-38.9, adult: Secondary | ICD-10-CM

## 2019-04-15 DIAGNOSIS — K22710 Barrett's esophagus with low grade dysplasia: Secondary | ICD-10-CM | POA: Diagnosis not present

## 2019-04-15 DIAGNOSIS — I739 Peripheral vascular disease, unspecified: Secondary | ICD-10-CM | POA: Diagnosis not present

## 2019-04-15 DIAGNOSIS — I2510 Atherosclerotic heart disease of native coronary artery without angina pectoris: Secondary | ICD-10-CM | POA: Diagnosis not present

## 2019-04-15 DIAGNOSIS — E66812 Obesity, class 2: Secondary | ICD-10-CM

## 2019-04-15 DIAGNOSIS — E7800 Pure hypercholesterolemia, unspecified: Secondary | ICD-10-CM | POA: Diagnosis not present

## 2019-04-15 DIAGNOSIS — K2100 Gastro-esophageal reflux disease with esophagitis, without bleeding: Secondary | ICD-10-CM

## 2019-04-15 LAB — POCT GLYCOSYLATED HEMOGLOBIN (HGB A1C): Hemoglobin A1C: 5.8 % — AB (ref 4.0–5.6)

## 2019-04-15 NOTE — Progress Notes (Signed)
Patient: Andrew Lyons Male    DOB: August 08, 1946   73 y.o.   MRN: [id number] Visit Date: 04/15/2019  Today's Provider: [name], MD   Chief Complaint  Patient presents with  . Diabetes   Subjective:     HPI  Diabetes Mellitus Type II, Follow-up:   Lab Results  Component Value Date   HGBA1C 5.6 12/11/2018   HGBA1C 5.8 (H) 08/22/2018   HGBA1C 9.0 (A) 04/26/2018   He is doing well with no symptomatic hypoglycemia. Last seen for diabetes 4 months ago.  Management since then includes none  . He reports good compliance with treatment. He is not having side effects.  Current symptoms include none and have been stable. Home blood sugar records: fasting range: 100-150  Episodes of hypoglycemia? no   Current insulin regiment: Is not on insulin Most Recent Eye Exam: up to date Weight trend: stable Prior visit with dietician: Yes  Current exercise: none Current diet habits: well balanced  Pertinent Labs:    Component Value Date/Time   CHOL 133 08/22/2018 0756   TRIG 174 (H) 08/22/2018 0756   HDL 33 (L) 08/22/2018 0756   LDLCALC 65 08/22/2018 0756   LDLCALC 71 09/21/2017 0825   CREATININE 0.85 10/28/2018 0331    Wt Readings from Last 3 Encounters:  04/15/19 238 lb (108 kg)  02/05/19 237 lb 6.4 oz (107.7 kg)  12/11/18 233 lb 12.8 oz (106.1 kg)    Allergies  Allergen Reactions  . Iodinated Diagnostic Agents Rash    had a red chest -unusre of what kind of dye it was  . Zyban [Bupropion] Rash  . Zocor [Simvastatin] Other (See Comments)    Reaction: hepatitis   . Gadolinium Derivatives Rash     Current Outpatient Medications:  .  amLODipine-benazepril (LOTREL) 5-40 MG capsule, TAKE 1 CAPSULE BY MOUTH EVERY DAY (Patient taking differently: Take 1 capsule by mouth daily. ), Disp: 90 capsule, Rfl: 3 .  B-D UF III MINI PEN NEEDLES 31G X 5 MM MISC, USE WITH PEN DAILY, Disp: 100 each, Rfl: 3 .  Blood Glucose Monitoring Suppl (ONE TOUCH ULTRA SYSTEM  KIT) w/Device KIT, To use daily to check sugar. DX E11.9-needs one touch ultra meter, Disp: 1 each, Rfl: 0 .  cholecalciferol (VITAMIN D) 25 MCG (1000 UT) tablet, Take 1,000 Units by mouth daily. , Disp: , Rfl:  .  clopidogrel (PLAVIX) 75 MG tablet, Take 1 tablet (75 mg total) by mouth daily., Disp: 30 tablet, Rfl: 11 .  Coenzyme Q10 100 MG TABS, Take 100 mg by mouth daily. , Disp: , Rfl:  .  fluticasone (FLONASE) 50 MCG/ACT nasal spray, Place 2 sprays into both nostrils daily., Disp: 48 mL, Rfl: 3 .  JARDIANCE 25 MG TABS tablet, TAKE 1 TABLET BY MOUTH EVERY DAY, Disp: 30 tablet, Rfl: 10 .  Lancets (ONETOUCH ULTRASOFT) lancets, USE 1  TO CHECK GLUCOSE THREE TIMES DAILY, Disp: 100 each, Rfl: 0 .  liraglutide (VICTOZA) 18 MG/3ML SOPN, INJECT 1.8 SUBCUTANEOUSLY EVERY DAY, Disp: 9 pen, Rfl: 3 .  metFORMIN (GLUCOPHAGE) 500 MG tablet, TAKE 1 TABLET (500 MG TOTAL) BY MOUTH 2 (TWO) TIMES DAILY WITH A MEAL., Disp: 180 tablet, Rfl: 3 .  metoprolol succinate (TOPROL-XL) 100 MG 24 hr tablet, TAKE 1 TABLET BY MOUTH EVERY DAY, Disp: 90 tablet, Rfl: 3 .  montelukast (SINGULAIR) 10 MG tablet, TAKE 1 TABLET BY MOUTH EVERY DAY, Disp: 90 tablet, Rfl: 3 .  omeprazole (  PRILOSEC) 40 MG capsule, TAKE 1 CAPSULE BY MOUTH EVERY DAY, Disp: 90 capsule, Rfl: 3 .  ONE TOUCH ULTRA TEST test strip, USE 1 STRIP TO CHECK GLUCOSE THREE TIMES DAILY, Disp: 100 each, Rfl: 0 .  oxyCODONE (OXY IR/ROXICODONE) 5 MG immediate release tablet, Take 1 tablet (5 mg total) by mouth every 6 (six) hours as needed for moderate pain., Disp: 16 tablet, Rfl: 0 .  rosuvastatin (CRESTOR) 20 MG tablet, TAKE 1 TABLET BY MOUTH EVERY DAY (Patient taking differently: Take 20 mg by mouth daily. ), Disp: 90 tablet, Rfl: 2 .  sildenafil (VIAGRA) 100 MG tablet, Take 100 mg by mouth as needed for erectile dysfunction. , Disp: , Rfl:  .  vitamin B-12 (CYANOCOBALAMIN) 1000 MCG tablet, Take 1,000 mcg by mouth daily., Disp: , Rfl:  .  aspirin EC 81 MG tablet, Take 1  tablet (81 mg total) by mouth daily. (Patient not taking: Reported on 04/15/2019), Disp: 30 tablet, Rfl: 2  Review of Systems  Constitutional: Negative for diaphoresis, fatigue and fever.  Eyes: Negative.   Respiratory: Negative for cough and shortness of breath.   Cardiovascular: Negative for chest pain, palpitations and leg swelling.  Endocrine: Negative for cold intolerance, heat intolerance, polydipsia, polyphagia and polyuria.  Musculoskeletal: Positive for arthralgias.  Allergic/Immunologic: Negative.   Neurological: Negative for dizziness, light-headedness and headaches.  Hematological: Negative.   Psychiatric/Behavioral: Negative for agitation, self-injury, sleep disturbance and suicidal ideas. The patient is not nervous/anxious.     Social History   Tobacco Use  . Smoking status: Former Smoker    Quit date: 10/02/2001    Years since quitting: 17.5  . Smokeless tobacco: Former Systems developer  . Tobacco comment: Quit smoking in 2003; Started smoking at age 47, smoked about 40 years, smoked over 3 packs per day  Substance Use Topics  . Alcohol use: Not Currently      Objective:   BP 118/72   Pulse 72   Temp 98.5 F (36.9 C)   Resp 16   Ht '5\' 10"'$  (1.778 m)   Wt 238 lb (108 kg)   BMI 34.15 kg/m  Vitals:   04/15/19 0945  BP: 118/72  Pulse: 72  Resp: 16  Temp: 98.5 F (36.9 C)  Weight: 238 lb (108 kg)  Height: '5\' 10"'$  (1.778 m)     Physical Exam Vitals signs reviewed.  Constitutional:      Appearance: He is well-developed.  HENT:     Head: Normocephalic and atraumatic.     Right Ear: External ear normal.     Left Ear: External ear normal.     Nose: Nose normal.  Eyes:     General: No scleral icterus.    Conjunctiva/sclera: Conjunctivae normal.  Neck:     Thyroid: No thyromegaly.  Cardiovascular:     Rate and Rhythm: Normal rate and regular rhythm.     Heart sounds: Normal heart sounds.  Pulmonary:     Effort: Pulmonary effort is normal.     Breath sounds:  Normal breath sounds.  Abdominal:     Palpations: Abdomen is soft.  Musculoskeletal:     Comments: S/p Right BKA.  Skin:    General: Skin is warm and dry.     Comments: BKA stump is healing well.  The wound looks really good.  Neurological:     General: No focal deficit present.     Mental Status: He is alert and oriented to person, place, and time.  Psychiatric:  Mood and Affect: Mood normal.        Behavior: Behavior normal.        Thought Content: Thought content normal.        Judgment: Judgment normal.      No results found for any visits on 04/15/19.     Assessment & Plan    1. Type 2 diabetes mellitus with other circulatory complication, without long-term current use of insulin (HCC) Excellent control--no changes.  More than 50% 25 minute visit spent in counseling or coordination of care RTC 4 months. - POCT glycosylated hemoglobin (Hb A1C)--5.8 today--1 year ago was 9.0 2. PAD (peripheral artery disease) ([place]) Per vascular.  3. Atherosclerosis of native coronary artery of native heart without angina pectoris All risk factors trated.  4. Barrett's esophagus with low grade dysplasia On PPI  5. Gastroesophageal reflux disease with esophagitis   6. Pure hypercholesterolemia On crestor.  7. Class 2 severe obesity due to excess calories with serious comorbidity and body mass index (BMI) of 38.0 to 38.9 in adult [facility]) With DM/ASCVD     [name], MD  [facility]

## 2019-04-17 DIAGNOSIS — M25511 Pain in right shoulder: Secondary | ICD-10-CM | POA: Diagnosis not present

## 2019-04-17 DIAGNOSIS — M25611 Stiffness of right shoulder, not elsewhere classified: Secondary | ICD-10-CM | POA: Diagnosis not present

## 2019-04-18 ENCOUNTER — Other Ambulatory Visit: Payer: Self-pay | Admitting: Specialist

## 2019-04-18 DIAGNOSIS — M7541 Impingement syndrome of right shoulder: Secondary | ICD-10-CM

## 2019-04-24 DIAGNOSIS — M25511 Pain in right shoulder: Secondary | ICD-10-CM | POA: Diagnosis not present

## 2019-04-24 DIAGNOSIS — M25611 Stiffness of right shoulder, not elsewhere classified: Secondary | ICD-10-CM | POA: Diagnosis not present

## 2019-04-25 ENCOUNTER — Telehealth: Payer: Self-pay

## 2019-04-25 ENCOUNTER — Other Ambulatory Visit: Payer: Self-pay

## 2019-04-25 NOTE — Telephone Encounter (Signed)
Spoke with patient to let him know I called in [facility]hr prep [facility] [place].  Prednisone 50mg  PO 05/13/19 @ 0145, 0745 and 1345; Benadryl 50mg  PO 05/13/19 @1345 , also.   He stated an understanding of these instructions and also was given our address and directions to our office.

## 2019-05-01 DIAGNOSIS — M25611 Stiffness of right shoulder, not elsewhere classified: Secondary | ICD-10-CM | POA: Diagnosis not present

## 2019-05-01 DIAGNOSIS — M25511 Pain in right shoulder: Secondary | ICD-10-CM | POA: Diagnosis not present

## 2019-05-13 ENCOUNTER — Ambulatory Visit
Admission: RE | Admit: 2019-05-13 | Discharge: 2019-05-13 | Disposition: A | Discharge status: Home / Self Care | Payer: PPO | Source: Ambulatory Visit | Attending: Specialist | Admitting: Specialist

## 2019-05-13 ENCOUNTER — Other Ambulatory Visit: Payer: Self-pay

## 2019-05-13 DIAGNOSIS — M7541 Impingement syndrome of right shoulder: Secondary | ICD-10-CM

## 2019-05-13 NOTE — Progress Notes (Signed)
Recalled in 13 hr prep for CT Arthrogram w/ contrast on 8/20 at 1445. Prescription [facility]. Pt aware and verbalized understanding of instructions. Prescription: 0145- 50mg  Prednisone 0745- 50mg  Prednisone 1345- 50mg  Prednisone and 50mg  Benadryl

## 2019-05-16 ENCOUNTER — Ambulatory Visit
Admission: RE | Admit: 2019-05-16 | Discharge: 2019-05-16 | Disposition: A | Discharge status: Home / Self Care | Payer: PPO | Source: Ambulatory Visit | Attending: Specialist | Admitting: Specialist

## 2019-05-16 ENCOUNTER — Other Ambulatory Visit: Payer: Self-pay

## 2019-05-16 DIAGNOSIS — G8929 Other chronic pain: Secondary | ICD-10-CM | POA: Diagnosis not present

## 2019-05-16 DIAGNOSIS — M25511 Pain in right shoulder: Secondary | ICD-10-CM | POA: Diagnosis not present

## 2019-05-16 MED ORDER — IOPAMIDOL (ISOVUE-M 200) INJECTION 41%
12.0000 mL | Freq: Once | INTRAMUSCULAR | Status: AC
  Administered 2019-05-16: 12 mL via INTRA_ARTICULAR

## 2019-05-25 ENCOUNTER — Other Ambulatory Visit: Payer: Self-pay | Admitting: Family Medicine

## 2019-05-27 DIAGNOSIS — M7541 Impingement syndrome of right shoulder: Secondary | ICD-10-CM | POA: Diagnosis not present

## 2019-05-29 ENCOUNTER — Other Ambulatory Visit: Payer: Self-pay | Admitting: Family Medicine

## 2019-05-29 DIAGNOSIS — E118 Type 2 diabetes mellitus with unspecified complications: Secondary | ICD-10-CM

## 2019-06-27 ENCOUNTER — Other Ambulatory Visit: Payer: Self-pay | Admitting: Family Medicine

## 2019-07-10 ENCOUNTER — Telehealth: Payer: Self-pay | Admitting: Family Medicine

## 2019-07-10 NOTE — Telephone Encounter (Signed)
Pt's wife needing to discuss the bill regarding June 15th appointment. She called [place] and they advised her to contact office.  Please call [name] at [phone].  Thanks, [employer]

## 2019-07-11 NOTE — Telephone Encounter (Signed)
Contacted Mrs. Andrew Lyons,   She explained the details of the bill for Andrew Lyons dated from a CCM phone call on June 15th. [facility] paid approximately $90 of the bill, but the patient still owes about $40.   Patient's wife Andrew Lyons is incredibly upset because Andrew Lyons was told there would be no copay. If he had known about the copay he would not have consented. I assured Andrew Lyons that I had no knowledge of this bill, and to the best of my knowledge, [facility] patients did not have copays for CCM services. I am forwarding this to the CCM supervisor, Andrew Lyons. Provided Andrew Lyons with Andrew Lyons name and credentials and asked her to expect a call.   Andrew Lyons does not want any future phone calls from CCM. I will disenroll him from the program at this time.     Of note, Mrs. Andrew Lyons also mentioned the "pharmacist was supposed to help get Andrew Lyons his medicine for free". I did apply to the [employer] assistance program for Victoza, and he is over the income eligibility requirements. Copies of paperwork are uploaded under media tab.   Andrew Lyons, PharmD Clinical Pharmacist [place] [phone]

## 2019-07-15 NOTE — Progress Notes (Signed)
     Patient: Andrew Lyons Male    DOB: 10/24/1945   73 y.o.   MRN: [id number] Visit Date: 07/17/2019  Today's Provider: [name] Gilbert Jr, MD   Chief Complaint  Patient presents with  . Diabetes  . Gastroesophageal Reflux  . Hyperlipidemia   Subjective:     HPI   Type 2 diabetes mellitus with other circulatory complication, without long-term current use of insulin (HCC) From 04/15/2019-Excellent control--no changes.  Hb A1C 5.8.   Lab Results  Component Value Date   HGBA1C 5.8 (A) 04/15/2019   HGBA1C 5.6 12/11/2018   HGBA1C 5.8 (H) 08/22/2018    Atherosclerosis of native coronary artery of native heart without angina pectoris From 04/15/2019-All risk factors trated.  Barrett's esophagus with low grade dysplasia From 04/15/2019-On PPI  Pure hypercholesterolemia From 04/15/2019-On crestor.  Lab Results  Component Value Date   CHOL 133 08/22/2018   HDL 33 (L) 08/22/2018   LDLCALC 65 08/22/2018   TRIG 174 (H) 08/22/2018   CHOLHDL 4.0 08/22/2018     Class 2 severe obesity due to excess calories with serious comorbidity and body mass index (BMI) of 38.0 to 38.9 in adult (HCC) From 04/15/2019-With DM/ASCVD. He is having trouble adjusting to his stump revision that was necessary recently and his new prosthesis that was necessitated because of the revision  Allergies  Allergen Reactions  . Iodinated Diagnostic Agents Rash    had a red chest -unusre of what kind of dye it was  . Zyban [Bupropion] Rash  . Zocor [Simvastatin] Other (See Comments)    Reaction: hepatitis   . Gadolinium Derivatives Rash     Current Outpatient Medications:  .  amLODipine-benazepril (LOTREL) 5-40 MG capsule, TAKE 1 CAPSULE BY MOUTH EVERY DAY (Patient taking differently: Take 1 capsule by mouth daily. ), Disp: 90 capsule, Rfl: 3 .  B-D UF III MINI PEN NEEDLES 31G X 5 MM MISC, USE WITH PEN DAILY, Disp: 100 each, Rfl: 3 .  Blood Glucose Monitoring Suppl (ONE TOUCH ULTRA SYSTEM KIT)  w/Device KIT, To use daily to check sugar. DX E11.9-needs one touch ultra meter, Disp: 1 each, Rfl: 0 .  cholecalciferol (VITAMIN D) 25 MCG (1000 UT) tablet, Take 1,000 Units by mouth daily. , Disp: , Rfl:  .  clopidogrel (PLAVIX) 75 MG tablet, Take 1 tablet (75 mg total) by mouth daily., Disp: 30 tablet, Rfl: 11 .  Coenzyme Q10 100 MG TABS, Take 100 mg by mouth daily. , Disp: , Rfl:  .  fluticasone (FLONASE) 50 MCG/ACT nasal spray, Place 2 sprays into both nostrils daily., Disp: 48 mL, Rfl: 3 .  JARDIANCE 25 MG TABS tablet, TAKE 1 TABLET BY MOUTH EVERY DAY, Disp: 30 tablet, Rfl: 10 .  Lancets (ONETOUCH ULTRASOFT) lancets, USE 1  TO CHECK GLUCOSE THREE TIMES DAILY, Disp: 100 each, Rfl: 11 .  liraglutide (VICTOZA) 18 MG/3ML SOPN, INJECT 1.2 SUBCUTANEOUSLY EVERY DAY, Disp: 5 pen, Rfl: 5 .  metFORMIN (GLUCOPHAGE) 500 MG tablet, TAKE 1 TABLET (500 MG TOTAL) BY MOUTH 2 (TWO) TIMES DAILY WITH A MEAL., Disp: 180 tablet, Rfl: 3 .  metoprolol succinate (TOPROL-XL) 100 MG 24 hr tablet, TAKE 1 TABLET BY MOUTH EVERY DAY, Disp: 90 tablet, Rfl: 3 .  montelukast (SINGULAIR) 10 MG tablet, TAKE 1 TABLET BY MOUTH EVERY DAY, Disp: 90 tablet, Rfl: 3 .  omeprazole (PRILOSEC) 40 MG capsule, TAKE 1 CAPSULE BY MOUTH EVERY DAY, Disp: 90 capsule, Rfl: 3 .  ONE TOUCH ULTRA TEST   test strip, USE 1 STRIP TO CHECK GLUCOSE THREE TIMES DAILY, Disp: 100 each, Rfl: 0 .  oxyCODONE (OXY IR/ROXICODONE) 5 MG immediate release tablet, Take 1 tablet (5 mg total) by mouth every 6 (six) hours as needed for moderate pain., Disp: 16 tablet, Rfl: 0 .  rosuvastatin (CRESTOR) 20 MG tablet, TAKE 1 TABLET BY MOUTH EVERY DAY, Disp: 90 tablet, Rfl: 2 .  sildenafil (VIAGRA) 100 MG tablet, Take 100 mg by mouth as needed for erectile dysfunction. , Disp: , Rfl:  .  vitamin B-12 (CYANOCOBALAMIN) 1000 MCG tablet, Take 1,000 mcg by mouth daily., Disp: , Rfl:   Review of Systems  Constitutional: Negative for appetite change, chills and fever.  Eyes:  Negative.   Respiratory: Negative for chest tightness, shortness of breath and wheezing.   Cardiovascular: Negative for chest pain and palpitations.  Gastrointestinal: Negative for abdominal pain, nausea and vomiting.  Endocrine: Negative.   Musculoskeletal: Positive for gait problem.  Skin: Negative.   Allergic/Immunologic: Negative.   Hematological: Negative.   Psychiatric/Behavioral: Negative.     Social History   Tobacco Use  . Smoking status: Former Smoker    Quit date: 10/02/2001    Years since quitting: 17.8  . Smokeless tobacco: Former Systems developer  . Tobacco comment: Quit smoking in 2003; Started smoking at age 54, smoked about 40 years, smoked over 3 packs per day  Substance Use Topics  . Alcohol use: Not Currently      Objective:   BP 126/68   Pulse 74   Temp 98.1 F (36.7 C)   Resp 16   Ht 5' 10" (1.778 m)   Wt 248 lb (112.5 kg)   SpO2 97%   BMI 35.58 kg/m  Vitals:   07/17/19 0924  BP: 126/68  Pulse: 74  Resp: 16  Temp: 98.1 F (36.7 C)  SpO2: 97%  Weight: 248 lb (112.5 kg)  Height: 5' 10" (1.778 m)  Body mass index is 35.58 kg/m.   Physical Exam Vitals signs reviewed.  Constitutional:      Appearance: He is well-developed. He is obese.  HENT:     Head: Normocephalic and atraumatic.     Right Ear: External ear normal.     Left Ear: External ear normal.     Nose: Nose normal.  Eyes:     General: No scleral icterus.    Conjunctiva/sclera: Conjunctivae normal.  Neck:     Thyroid: No thyromegaly.  Cardiovascular:     Rate and Rhythm: Normal rate and regular rhythm.     Heart sounds: Normal heart sounds.  Pulmonary:     Effort: Pulmonary effort is normal.     Breath sounds: Normal breath sounds.  Abdominal:     Palpations: Abdomen is soft.  Musculoskeletal:     Comments: S/p Right BKA.    Skin:    General: Skin is warm and dry.  Neurological:     General: No focal deficit present.     Mental Status: He is alert and oriented to person, place,  and time. Mental status is at baseline.  Psychiatric:        Mood and Affect: Mood normal.        Behavior: Behavior normal.        Thought Content: Thought content normal.        Judgment: Judgment normal.      No results found for any visits on 07/17/19.     Assessment & Plan    1. Type 2  diabetes mellitus with other circulatory complication, without long-term current use of insulin (HCC) Presently stable.  Patient to work on diet and exercise for his weight - Hemoglobin A1c - TSH  2. Barrett's esophagus with low grade dysplasia On omeprazole daily  3. Pure hypercholesterolemia On Crestor 20 - Lipid panel - Comprehensive metabolic panel  4. Obstructive apnea Uses CPAP nightly  5. Need for influenza vaccination  - Flu Vaccine QUAD High Dose(Fluad)  6. PAD (peripheral artery disease) ([place]) Per vascular  7. Hx of BKA, right ([place]) Followed  by vascular     [name], MD  [facility]

## 2019-07-17 ENCOUNTER — Encounter: Payer: Self-pay | Admitting: Family Medicine

## 2019-07-17 ENCOUNTER — Ambulatory Visit (INDEPENDENT_AMBULATORY_CARE_PROVIDER_SITE_OTHER): Payer: PPO | Admitting: Family Medicine

## 2019-07-17 ENCOUNTER — Other Ambulatory Visit: Payer: Self-pay

## 2019-07-17 VITALS — BP 126/68 | HR 74 | Temp 98.1°F | Resp 16 | Ht 70.0 in | Wt 248.0 lb

## 2019-07-17 DIAGNOSIS — Z89511 Acquired absence of right leg below knee: Secondary | ICD-10-CM

## 2019-07-17 DIAGNOSIS — G4733 Obstructive sleep apnea (adult) (pediatric): Secondary | ICD-10-CM | POA: Diagnosis not present

## 2019-07-17 DIAGNOSIS — Z23 Encounter for immunization: Secondary | ICD-10-CM

## 2019-07-17 DIAGNOSIS — E1159 Type 2 diabetes mellitus with other circulatory complications: Secondary | ICD-10-CM | POA: Diagnosis not present

## 2019-07-17 DIAGNOSIS — I739 Peripheral vascular disease, unspecified: Secondary | ICD-10-CM

## 2019-07-17 DIAGNOSIS — K22710 Barrett's esophagus with low grade dysplasia: Secondary | ICD-10-CM

## 2019-07-17 DIAGNOSIS — E7800 Pure hypercholesterolemia, unspecified: Secondary | ICD-10-CM

## 2019-07-18 DIAGNOSIS — E7800 Pure hypercholesterolemia, unspecified: Secondary | ICD-10-CM | POA: Diagnosis not present

## 2019-07-18 DIAGNOSIS — E1159 Type 2 diabetes mellitus with other circulatory complications: Secondary | ICD-10-CM | POA: Diagnosis not present

## 2019-07-19 LAB — COMPREHENSIVE METABOLIC PANEL
ALT: 5 IU/L (ref 0–44)
AST: 13 IU/L (ref 0–40)
Albumin/Globulin Ratio: 1.9 (ref 1.2–2.2)
Albumin: 4.3 g/dL (ref 3.7–4.7)
Alkaline Phosphatase: 119 IU/L — ABNORMAL HIGH (ref 39–117)
BUN/Creatinine Ratio: 17 (ref 10–24)
BUN: 18 mg/dL (ref 8–27)
Bilirubin Total: 1.3 mg/dL — ABNORMAL HIGH (ref 0.0–1.2)
CO2: 20 mmol/L (ref 20–29)
Calcium: 9.2 mg/dL (ref 8.6–10.2)
Chloride: 102 mmol/L (ref 96–106)
Creatinine, Ser: 1.03 mg/dL (ref 0.76–1.27)
GFR calc Af Amer: 84 mL/min/{1.73_m2} (ref >59)
GFR calc non Af Amer: 72 mL/min/{1.73_m2} (ref >59)
Globulin, Total: 2.3 g/dL (ref 1.5–4.5)
Glucose: 132 mg/dL — ABNORMAL HIGH (ref 65–99)
Potassium: 4.1 mmol/L (ref 3.5–5.2)
Sodium: 139 mmol/L (ref 134–144)
Total Protein: 6.6 g/dL (ref 6.0–8.5)

## 2019-07-19 LAB — LIPID PANEL
Chol/HDL Ratio: 3.7 ratio (ref 0.0–5.0)
Cholesterol, Total: 130 mg/dL (ref 100–199)
HDL: 35 mg/dL — ABNORMAL LOW (ref >39)
LDL Chol Calc (NIH): 71 mg/dL (ref 0–99)
Triglycerides: 138 mg/dL (ref 0–149)
VLDL Cholesterol Cal: 24 mg/dL (ref 5–40)

## 2019-07-19 LAB — HEMOGLOBIN A1C
Est. average glucose Bld gHb Est-mCnc: 131 mg/dL
Hgb A1c MFr Bld: 6.2 % — ABNORMAL HIGH (ref 4.8–5.6)

## 2019-07-19 LAB — TSH: TSH: 1.89 u[IU]/mL (ref 0.450–4.500)

## 2019-07-24 ENCOUNTER — Encounter: Payer: Self-pay | Admitting: *Deleted

## 2019-08-09 ENCOUNTER — Other Ambulatory Visit: Payer: Self-pay

## 2019-08-09 ENCOUNTER — Ambulatory Visit (INDEPENDENT_AMBULATORY_CARE_PROVIDER_SITE_OTHER): Payer: PPO | Admitting: Vascular Surgery

## 2019-08-09 ENCOUNTER — Ambulatory Visit (INDEPENDENT_AMBULATORY_CARE_PROVIDER_SITE_OTHER): Payer: PPO

## 2019-08-09 ENCOUNTER — Encounter (INDEPENDENT_AMBULATORY_CARE_PROVIDER_SITE_OTHER): Payer: Self-pay | Admitting: Vascular Surgery

## 2019-08-09 VITALS — BP 146/75 | HR 68 | Resp 16 | Ht 70.0 in | Wt 246.0 lb

## 2019-08-09 DIAGNOSIS — Z89511 Acquired absence of right leg below knee: Secondary | ICD-10-CM

## 2019-08-09 DIAGNOSIS — I739 Peripheral vascular disease, unspecified: Secondary | ICD-10-CM

## 2019-08-09 DIAGNOSIS — I10 Essential (primary) hypertension: Secondary | ICD-10-CM

## 2019-08-09 DIAGNOSIS — E1159 Type 2 diabetes mellitus with other circulatory complications: Secondary | ICD-10-CM | POA: Diagnosis not present

## 2019-08-09 NOTE — Progress Notes (Signed)
MRN : [id number]  Andrew Lyons is a 73 y.o. (May 12, 1946) male who presents with chief complaint of  Chief Complaint  Patient presents with  . Follow-up    ultrasound  .  History of Present Illness: Patient returns today in follow up of his PAD.  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  He is now walking with his prosthesis.  He is really having no issues other than some pain and numbness in the toes on his left foot.  No new ulceration or infection. Studies today showed normal ABIs and waveforms down to the digits with a left ABI of 1.09.   Current Outpatient Medications  Medication Sig Dispense Refill  . amLODipine-benazepril (LOTREL) 5-40 MG capsule TAKE 1 CAPSULE BY MOUTH EVERY DAY (Patient taking differently: Take 1 capsule by mouth daily. ) 90 capsule 3  . B-D UF III MINI PEN NEEDLES 31G X 5 MM MISC USE WITH PEN DAILY 100 each 3  . Blood Glucose Monitoring Suppl (ONE TOUCH ULTRA SYSTEM KIT) w/Device KIT To use daily to check sugar. DX E11.9-needs one touch ultra meter 1 each 0  . cholecalciferol (VITAMIN D) 25 MCG (1000 UT) tablet Take 1,000 Units by mouth daily.     . clopidogrel (PLAVIX) 75 MG tablet Take 1 tablet (75 mg total) by mouth daily. 30 tablet 11  . Coenzyme Q10 100 MG TABS Take 100 mg by mouth daily.     . fluticasone (FLONASE) 50 MCG/ACT nasal spray Place 2 sprays into both nostrils daily. 48 mL 3  . JARDIANCE 25 MG TABS tablet TAKE 1 TABLET BY MOUTH EVERY DAY 30 tablet 10  . Lancets (ONETOUCH ULTRASOFT) lancets USE 1  TO CHECK GLUCOSE THREE TIMES DAILY 100 each 11  . liraglutide (VICTOZA) 18 MG/3ML SOPN INJECT 1.2 SUBCUTANEOUSLY EVERY DAY 5 pen 5  . metFORMIN (GLUCOPHAGE) 500 MG tablet TAKE 1 TABLET (500 MG TOTAL) BY MOUTH 2 (TWO) TIMES DAILY WITH A MEAL. 180 tablet 3  . metoprolol succinate (TOPROL-XL) 100 MG 24 hr tablet TAKE 1 TABLET BY MOUTH EVERY DAY 90 tablet 3  . montelukast (SINGULAIR) 10 MG tablet TAKE 1 TABLET BY MOUTH EVERY DAY 90 tablet 3  . omeprazole  (PRILOSEC) 40 MG capsule TAKE 1 CAPSULE BY MOUTH EVERY DAY 90 capsule 3  . ONE TOUCH ULTRA TEST test strip USE 1 STRIP TO CHECK GLUCOSE THREE TIMES DAILY 100 each 0  . rosuvastatin (CRESTOR) 20 MG tablet TAKE 1 TABLET BY MOUTH EVERY DAY 90 tablet 2  . sildenafil (VIAGRA) 100 MG tablet Take 100 mg by mouth as needed for erectile dysfunction.     . vitamin B-12 (CYANOCOBALAMIN) 1000 MCG tablet Take 1,000 mcg by mouth daily.    Marland Kitchen oxyCODONE (OXY IR/ROXICODONE) 5 MG immediate release tablet Take 1 tablet (5 mg total) by mouth every 6 (six) hours as needed for moderate pain. (Patient not taking: Reported on 08/09/2019) 16 tablet 0   No current facility-administered medications for this visit.     Past Medical History:  Diagnosis Date  . 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 Barrett's esophagus with dysplasia   . COPD (chronic obstructive pulmonary disease) ([place])   . Coronary artery disease   . Diabetes mellitus without complication ([place])   . GERD (gastroesophageal reflux disease)   . Hepatitis   . Hx of dysplastic nevus 2007   multiple sites   . Hyperlipidemia   . Hypertension   . Myocardial infarction ([place])   .  Seizures ([place])   . Sleep apnea     Past Surgical History:  Procedure Laterality Date  . ABDOMINAL AORTOGRAM W/LOWER EXTREMITY N/A 06/20/2018   Procedure: ABDOMINAL AORTOGRAM W/LOWER EXTREMITY;  Surgeon: Wellington Hampshire, MD;  Location: [facility];  Service: Cardiovascular;  Laterality: N/A;  . AMPUTATION Right 10/27/2018   Procedure: AMPUTATION BELOW KNEE;  Surgeon: Leim Fabry, MD;  Location: [facility];  Service: Orthopedics;  Laterality: Right;  . CARDIAC CATHETERIZATION     with Angioplasty  . cardiac stents    . CARDIAC VALVE REPLACEMENT     Aortic Valve Replacement  . COLONOSCOPY WITH PROPOFOL N/A 03/18/2015   Procedure: COLONOSCOPY WITH PROPOFOL;  Surgeon: [name], MD;  Location: [facility] ENDOSCOPY;  Service: Endoscopy;  Laterality: N/A;  .  ESOPHAGOGASTRODUODENOSCOPY N/A 03/18/2015   Procedure: ESOPHAGOGASTRODUODENOSCOPY (EGD);  Surgeon: [name], MD;  Location: [facility] ENDOSCOPY;  Service: Endoscopy;  Laterality: N/A;  . FOOT AMPUTATION    . GREEN LIGHT LASER TURP (TRANSURETHRAL RESECTION OF PROSTATE N/A 10/16/2018   Procedure: GREEN LIGHT LASER TURP (TRANSURETHRAL RESECTION OF PROSTATE;  Surgeon: Royston Cowper, MD;  Location: [facility];  Service: Urology;  Laterality: N/A;  . HARDWARE REMOVAL Right 10/27/2018   Procedure: HARDWARE REMOVAL;  Surgeon: Leim Fabry, MD;  Location: [facility];  Service: Orthopedics;  Laterality: Right;  . LOWER EXTREMITY ANGIOGRAPHY Right 10/25/2018   Procedure: Lower Extremity Angiography;  Surgeon: Algernon Huxley, MD;  Location: [facility];  Service: Cardiovascular;  Laterality: Right;  . PERIPHERAL VASCULAR INTERVENTION Right 06/20/2018   Procedure: PERIPHERAL VASCULAR INTERVENTION;  Surgeon: Wellington Hampshire, MD;  Location: [facility];  Service: Cardiovascular;  Laterality: Right;  . THORACOTOMY Right   . TONSILLECTOMY       Social History   Tobacco Use  . Smoking status: Former Smoker    Quit date: 10/02/2001    Years since quitting: 17.8  . Smokeless tobacco: Former Systems developer  . Tobacco comment: Quit smoking in 2003; Started smoking at age 48, smoked about 40 years, smoked over 3 packs per day  Substance Use Topics  . Alcohol use: Not Currently  . Drug use: No    Family History  Problem Relation Age of Onset  . Obesity Son   . Diabetes Brother   . Hypertension Brother   . Heart attack Mother   . Heart attack Father     Allergies  Allergen Reactions  . Iodinated Diagnostic Agents Rash    had a red chest -unusre of what kind of dye it was  . Zyban [Bupropion] Rash  . Zocor [Simvastatin] Other (See Comments)    Reaction: hepatitis   . Gadolinium Derivatives Rash    Review of Systems: Negative Unless Checked Constitutional: '[]'$ ?Weight  loss'[]'$ ?Fever'[]'$ ?Chills Cardiac:'[]'$ ?Chest pain'[]'$ ? Atrial Fibrillation'[]'$ ?Palpitations '[]'$ ?Shortness of breath when laying flat '[]'$ ?Shortness of breath with exertion. '[]'$ ?Shortness of breath at rest Vascular: '[]'$ ?Pain in legs with walking'[]'$ ?Pain in legswith standing'[]'$ ?Pain in legs when laying flat '[]'$ ?Claudication  '[]'$ ?Pain in feet when laying flat '[]'$ ?History of DVT '[]'$ ?Phlebitis '[]'$ ?Swelling in legs '[]'$ ?Varicose veins '[]'$ ?Non-healing ulcers Pulmonary: '[]'$ ?Uses home oxygen '[]'$ ?Productive cough'[]'$ ?Hemoptysis '[]'$ ?Wheeze '[x]'$ ?COPD '[]'$ ?Asthma Neurologic: '[]'$ ?Dizziness'[x]'$ ?Seizures '[]'$ ?Blackouts'[]'$ ?History of stroke '[]'$ ?History of TIA'[]'$ ?Aphasia '[]'$ ?Temporary Blindness'[]'$ ?Weaknessor numbness in arm '[]'$ ?Weakness or numbnessin leg Musculoskeletal:'[]'$ ?Joint swelling '[]'$ ?Joint pain '[]'$ ?Low back pain  '[]'$ ? History of Knee Replacement '[]'$ ?Arthritis '[]'$ ?back Surgeries'[]'$ ? Spinal Stenosis  Hematologic:'[]'$ ?Easy bruising'[]'$ ?Easy bleeding '[]'$ ?Hypercoagulable state '[]'$ ?Anemic Gastrointestinal:'[]'$ ?Diarrhea '[]'$ ?Vomiting'[x]'$ ?Gastroesophageal reflux/heartburn'[]'$ ?Difficulty swallowing. '[]'$ ?Abdominal pain Genitourinary: '[]'$ ?Chronic kidney disease '[]'$ ?Difficulturination '[]'$ ?Anuric'[]'$ ?Blood in urine '[]'$ ?Frequenturination '[]'$ ?Burning with  urination'[]'$ ?Hematuria Skin: '[]'$ ?Rashes '[]'$ ?Ulcers '[]'$ ?Wounds Psychological: '[]'$ ?History of anxiety'[]'$ ?History of major depression  '[]'$ ? Memory Difficulties    Physical Examination  BP (!) 146/75 (BP Location: Right Arm)   Pulse 68   Resp 16   Ht '5\' 10"'$  (1.778 m)   Wt 246 lb (111.6 kg)   BMI 35.30 kg/m  Gen:  WD/WN, NAD Head: [place]/AT, No temporalis wasting. Ear/Nose/Throat: Hearing grossly intact, nares w/o erythema or drainage Eyes: Conjunctiva clear. Sclera non-icteric Neck: Supple.  Trachea midline Pulmonary:  Good air movement, no use of accessory muscles.  Cardiac: RRR, no JVD Vascular:  Vessel Right Left  Radial  Palpable Palpable                          PT  not palpable Palpable  DP  not palpable Palpable   Gastrointestinal: soft, non-tender/non-distended. No guarding/reflex.  Musculoskeletal: M/S 5/5 throughout.  No deformity or atrophy.  Walking with a right BKA prosthesis.  No left leg edema. Neurologic: Sensation grossly intact in extremities.  Symmetrical.  Speech is fluent.  Psychiatric: Judgment intact, Mood & affect appropriate for pt's clinical situation. Dermatologic: No rashes or ulcers noted.  No cellulitis or open wounds.       Labs Recent Results (from the past 2160 hour(s))  Hemoglobin A1c     Status: Abnormal   Collection Time: 07/18/19  8:33 AM  Result Value Ref Range   Hgb A1c MFr Bld 6.2 (H) 4.8 - 5.6 %    Comment:          Prediabetes: 5.7 - 6.4          Diabetes: >6.4          Glycemic control for adults with diabetes: <7.0    Est. average glucose Bld gHb Est-mCnc 131 mg/dL  Lipid panel     Status: Abnormal   Collection Time: 07/18/19  8:33 AM  Result Value Ref Range   Cholesterol, Total 130 100 - 199 mg/dL   Triglycerides 138 0 - 149 mg/dL   HDL 35 (L) >39 mg/dL   VLDL Cholesterol Cal 24 5 - 40 mg/dL   LDL Chol Calc (NIH) 71 0 - 99 mg/dL   Chol/HDL Ratio 3.7 0.0 - 5.0 ratio    Comment:                                   T. Chol/HDL Ratio                                             Men  Women                               1/2 Avg.Risk  3.4    3.3                                   Avg.Risk  5.0    4.4                                2X Avg.Risk  9.6    7.1  3X Avg.Risk 23.4   11.0   Comprehensive metabolic panel     Status: Abnormal   Collection Time: 07/18/19  8:33 AM  Result Value Ref Range   Glucose 132 (H) 65 - 99 mg/dL   BUN 18 8 - 27 mg/dL   Creatinine, Ser 1.03 0.76 - 1.27 mg/dL   GFR calc non Af Amer 72 >59 mL/min/1.73   GFR calc Af Amer 84 >59 mL/min/1.73   BUN/Creatinine Ratio 17 10 - 24   Sodium 139 134 - 144  mmol/L   Potassium 4.1 3.5 - 5.2 mmol/L   Chloride 102 96 - 106 mmol/L   CO2 20 20 - 29 mmol/L   Calcium 9.2 8.6 - 10.2 mg/dL   Total Protein 6.6 6.0 - 8.5 g/dL   Albumin 4.3 3.7 - 4.7 g/dL   Globulin, Total 2.3 1.5 - 4.5 g/dL   Albumin/Globulin Ratio 1.9 1.2 - 2.2   Bilirubin Total 1.3 (H) 0.0 - 1.2 mg/dL   Alkaline Phosphatase 119 (H) 39 - 117 IU/L   AST 13 0 - 40 IU/L   ALT 5 0 - 44 IU/L  TSH     Status: None   Collection Time: 07/18/19  8:33 AM  Result Value Ref Range   TSH 1.890 0.450 - 4.500 uIU/mL    Radiology No results found.  Assessment/Plan  Diabetes blood glucose control important in reducing the progression of atherosclerotic disease. Also, involved in wound healing. On appropriate medications.  His pain in his left toes at this point may be neuropathic pain from diabetes.   Benign essential HTN blood pressure control important in reducing the progression of atherosclerotic disease. On appropriate oral medications.   Hx of BKA, right ([place]) Many years ago, well healed  PAD (peripheral artery disease) ([place]) Studies today showed normal ABIs and waveforms down to the digits with a left ABI of 1.09.  His right leg amputation is well-healed.  At this point, we can go with a annual follow-up but he will contact our office with any problems in the interim.    Leotis Pain, MD  08/09/2019 10:57 AM    This note was created with Dragon medical transcription system.  Any errors from dictation are purely unintentional

## 2019-08-09 NOTE — Assessment & Plan Note (Signed)
Studies today showed normal ABIs and waveforms down to the digits with a left ABI of 1.09.  His right leg amputation is well-healed.  At this point, we can go with a annual follow-up but he will contact our office with any problems in the interim.

## 2019-08-09 NOTE — Assessment & Plan Note (Signed)
Many years ago, well healed

## 2019-08-09 NOTE — Assessment & Plan Note (Signed)
blood pressure control important in reducing the progression of atherosclerotic disease. On appropriate oral medications.  

## 2019-08-09 NOTE — Assessment & Plan Note (Addendum)
blood glucose control important in reducing the progression of atherosclerotic disease. Also, involved in wound healing. On appropriate medications.  His pain in his left toes at this point may be neuropathic pain from diabetes.

## 2019-08-14 DIAGNOSIS — H0102A Squamous blepharitis right eye, upper and lower eyelids: Secondary | ICD-10-CM | POA: Diagnosis not present

## 2019-08-14 DIAGNOSIS — H25813 Combined forms of age-related cataract, bilateral: Secondary | ICD-10-CM | POA: Diagnosis not present

## 2019-08-14 DIAGNOSIS — H35361 Drusen (degenerative) of macula, right eye: Secondary | ICD-10-CM | POA: Diagnosis not present

## 2019-08-14 DIAGNOSIS — E119 Type 2 diabetes mellitus without complications: Secondary | ICD-10-CM | POA: Diagnosis not present

## 2019-08-14 DIAGNOSIS — H0102B Squamous blepharitis left eye, upper and lower eyelids: Secondary | ICD-10-CM | POA: Diagnosis not present

## 2019-08-14 NOTE — Progress Notes (Signed)
Subjective:   Andrew Lyons is a 73 y.o. male who presents for Medicare Annual/Subsequent preventive examination.    This visit is being conducted through telemedicine due to the COVID-19 pandemic. This patient has given me verbal consent via doximity to conduct this visit, patient states they are participating from their home address. Some vital signs may be absent or patient reported.    Patient identification: identified by name, DOB, and current address  Review of Systems:  N/A  Cardiac Risk Factors include: advanced age (>76mn, >>62women);diabetes mellitus;dyslipidemia;male gender;hypertension;obesity (BMI >30kg/m2)     Objective:    Vitals: There were no vitals taken for this visit.  There is no height or weight on file to calculate BMI. Unable to obtain vitals due to visit being conducted via telephonically.   Advanced Directives 08/15/2019 10/25/2018 10/24/2018 10/02/2018 08/10/2018 06/20/2018 05/22/2017  Does Patient Have a Medical Advance Directive? No No No No No No No  Type of Advance Directive - - - - - - -  Copy of Healthcare Power of Attorney in Chart? - - - - - - -  Would patient like information on creating a medical advance directive? No - Patient declined No - Patient declined No - Patient declined - No - Patient declined No - Patient declined -    Tobacco Social History   Tobacco Use  Smoking Status Former Smoker  . Quit date: 10/02/2001  . Years since quitting: 17.8  Smokeless Tobacco Former USystems developer Tobacco Comment   Quit smoking in 2003; Started smoking at age 73 smoked about 40 years, smoked over 3 packs per day     Counseling given: Not Answered Comment: Quit smoking in 2003; Started smoking at age 73 smoked about 40 years, smoked over 3 packs per day   Clinical Intake:  Pre-visit preparation completed: Yes  Pain : No/denies pain Pain Score: 0-No pain     Nutritional Risks: None Diabetes: Yes  How often do you need to have someone help  you when you read instructions, pamphlets, or other written materials from your doctor or pharmacy?: 1 - Never   Diabetes:  Is the patient diabetic?  Yes type 2 If diabetic, was a CBG obtained today?  No  Did the patient bring in their glucometer from home?  No  How often do you monitor your CBG's? Once a day or every other day.   Financial Strains and Diabetes Management:  Are you having any financial strains with the device, your supplies or your medication? No Does the patient want to be seen by Chronic Care Management for management of their diabetes?  No  Would the patient like to be referred to a Nutritionist or for Diabetic Management?  No   Diabetic Exams:  Diabetic Eye Exam: Completed 08/14/19. Repeat yearly.   Diabetic Foot Exam: Completed 01/23/17. Pt has been advised about the importance in completing this exam. Note made to follow up on this at next in office visit.    Interpreter Needed?: No  [facility] :: [facility]  Past Medical History:  Diagnosis Date  . 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 Barrett's esophagus with dysplasia   . Cataract   . COPD (chronic obstructive pulmonary disease) ([place]   . Coronary artery disease   . Diabetes mellitus without complication ([place]   . GERD (gastroesophageal reflux disease)   . Hepatitis   . Hx of dysplastic nevus 2007   multiple sites   .  Hyperlipidemia   . Hypertension   . Myocardial infarction ([place])   . Seizures ([place])   . Sleep apnea    Past Surgical History:  Procedure Laterality Date  . ABDOMINAL AORTOGRAM W/LOWER EXTREMITY N/A 06/20/2018   Procedure: ABDOMINAL AORTOGRAM W/LOWER EXTREMITY;  Surgeon: Wellington Hampshire, MD;  Location: [place] [facility];  Service: Cardiovascular;  Laterality: N/A;  . AMPUTATION Right 10/27/2018   Procedure: AMPUTATION BELOW KNEE;  Surgeon: Leim Fabry, MD;  Location: [facility] ORS;  Service: Orthopedics;  Laterality: Right;  . CARDIAC CATHETERIZATION      with Angioplasty  . cardiac stents    . CARDIAC VALVE REPLACEMENT     Aortic Valve Replacement  . COLONOSCOPY WITH PROPOFOL N/A 03/18/2015   Procedure: COLONOSCOPY WITH PROPOFOL;  Surgeon: [name], MD;  Location[facility] ENDOSCOPY;  Service: Endoscopy;  Laterality: N/A;  . ESOPHAGOGASTRODUODENOSCOPY N/A 03/18/2015   Procedure: ESOPHAGOGASTRODUODENOSCOPY (EGD);  Surgeon: [name], MD;  Location: [facility] ENDOSCOPY;  Service: Endoscopy;  Laterality: N/A;  . FOOT AMPUTATION    . GREEN LIGHT LASER TURP (TRANSURETHRAL RESECTION OF PROSTATE N/A 10/16/2018   Procedure: GREEN LIGHT LASER TURP (TRANSURETHRAL RESECTION OF PROSTATE;  Surgeon: Royston Cowper, MD;  Location: [facility] ORS;  Service: Urology;  Laterality: N/A;  . HARDWARE REMOVAL Right 10/27/2018   Procedure: HARDWARE REMOVAL;  Surgeon: Leim Fabry, MD;  Location: [facility] ORS;  Service: Orthopedics;  Laterality: Right;  . LOWER EXTREMITY ANGIOGRAPHY Right 10/25/2018   Procedure: Lower Extremity Angiography;  Surgeon: Algernon Huxley, MD;  Location: [place] [facility];  Service: Cardiovascular;  Laterality: Right;  . PERIPHERAL VASCULAR INTERVENTION Right 06/20/2018   Procedure: PERIPHERAL VASCULAR INTERVENTION;  Surgeon: Wellington Hampshire, MD;  Location: [place] [facility];  Service: Cardiovascular;  Laterality: Right;  . THORACOTOMY Right   . TONSILLECTOMY     Family History  Problem Relation Age of Onset  . Obesity Son   . Diabetes Brother   . Hypertension Brother   . Heart attack Mother   . Heart attack Father    Social History   Socioeconomic History  . Marital status: Married    Spouse name: Not on file  . Number of children: 2  . Years of education: Not on file  . Highest education level: 8th grade  Occupational History  . Occupation: retired  Scientific laboratory technician  . Financial resource strain: Not hard at all  . Food insecurity    Worry: Never true    Inability: Never true  . Transportation needs    Medical: No     Non-medical: No  Tobacco Use  . Smoking status: Former Smoker    Quit date: 10/02/2001    Years since quitting: 17.8  . Smokeless tobacco: Former Systems developer  . Tobacco comment: Quit smoking in 2003; Started smoking at age 80, smoked about 40 years, smoked over 3 packs per day  Substance and Sexual Activity  . Alcohol use: Not Currently  . Drug use: No  . Sexual activity: Not on file  Lifestyle  . Physical activity    Days per week: 0 days    Minutes per session: 0 min  . Stress: Not at all  Relationships  . Social Herbalist on phone: Patient refused    Gets together: Patient refused    Attends religious service: Patient refused    Active member of club or organization: Patient refused    Attends meetings of clubs or organizations: Patient refused  Relationship status: Patient refused  Other Topics Concern  . Not on file  Social History Narrative  . Not on file    Outpatient Encounter Medications as of 08/15/2019  Medication Sig  . amLODipine-benazepril (LOTREL) 5-40 MG capsule TAKE 1 CAPSULE BY MOUTH EVERY DAY (Patient taking differently: Take 1 capsule by mouth daily. )  . B-D UF III MINI PEN NEEDLES 31G X 5 MM MISC USE WITH PEN DAILY  . Blood Glucose Monitoring Suppl (ONE TOUCH ULTRA SYSTEM KIT) w/Device KIT To use daily to check sugar. DX E11.9-needs one touch ultra meter  . cholecalciferol (VITAMIN D) 25 MCG (1000 UT) tablet Take 1,000 Units by mouth daily.   . clopidogrel (PLAVIX) 75 MG tablet Take 1 tablet (75 mg total) by mouth daily.  . Coenzyme Q10 100 MG TABS Take 100 mg by mouth daily.   . fluticasone (FLONASE) 50 MCG/ACT nasal spray Place 2 sprays into both nostrils daily.  Marland Kitchen JARDIANCE 25 MG TABS tablet TAKE 1 TABLET BY MOUTH EVERY DAY  . Lancets (ONETOUCH ULTRASOFT) lancets USE 1  TO CHECK GLUCOSE THREE TIMES DAILY  . liraglutide (VICTOZA) 18 MG/3ML SOPN INJECT 1.2 SUBCUTANEOUSLY EVERY DAY  . metFORMIN (GLUCOPHAGE) 500 MG tablet TAKE 1 TABLET (500 MG TOTAL)  BY MOUTH 2 (TWO) TIMES DAILY WITH A MEAL.  . metoprolol succinate (TOPROL-XL) 100 MG 24 hr tablet TAKE 1 TABLET BY MOUTH EVERY DAY  . montelukast (SINGULAIR) 10 MG tablet TAKE 1 TABLET BY MOUTH EVERY DAY  . omeprazole (PRILOSEC) 40 MG capsule TAKE 1 CAPSULE BY MOUTH EVERY DAY  . ONE TOUCH ULTRA TEST test strip USE 1 STRIP TO CHECK GLUCOSE THREE TIMES DAILY  . oxyCODONE (OXY IR/ROXICODONE) 5 MG immediate release tablet Take 1 tablet (5 mg total) by mouth every 6 (six) hours as needed for moderate pain.  . rosuvastatin (CRESTOR) 20 MG tablet TAKE 1 TABLET BY MOUTH EVERY DAY  . sildenafil (VIAGRA) 100 MG tablet Take 100 mg by mouth as needed for erectile dysfunction.   . vitamin B-12 (CYANOCOBALAMIN) 1000 MCG tablet Take 1,000 mcg by mouth daily.   No facility-administered encounter medications on file as of 08/15/2019.     Activities of Daily Living In your present state of health, do you have any difficulty performing the following activities: 08/15/2019 10/25/2018  Hearing? Y N  Comment Does not wear hearing aids. -  Vision? Y N  Comment Has cataracts. -  Difficulty concentrating or making decisions? Y N  Walking or climbing stairs? Y N  Comment Due to previous leg amputation. -  Dressing or bathing? N N  Doing errands, shopping? N N  Preparing Food and eating ? N -  Using the Toilet? N -  In the past six months, have you accidently leaked urine? N -  Do you have problems with loss of bowel control? N -  Managing your Medications? N -  Managing your Finances? N -  Housekeeping or managing your Housekeeping? N -  Some recent data might be hidden    Patient Care Team: [name]., MD as PCP - General (Family Medicine) Cathi Roan, [facility] (Pharmacist) Corey Skains, MD as Consulting Physician (Cardiology) Lucky Cowboy Erskine Squibb, MD as Referring Physician (Vascular Surgery) Ralene Bathe, MD (Dermatology) Earnestine Leys, MD (Orthopedic Surgery)   Assessment:   This is  a routine wellness examination for Andrew Lyons.  Exercise Activities and Dietary recommendations Current Exercise Habits: The patient does not participate in regular exercise at  present, Exercise limited by: orthopedic condition(s)(leg amputation)  Goals      Patient Stated   . I would like to come off of my Prilosec (pt-stated)     Current Barriers:  Pill burden  Pharmacist Clinical Goal(s):  Marland Kitchen Over the next 30 days, patient and pharmacist will work together to develop a taper to come off of omeprazole safely and with minimal side effects as reported by patient.   Interventions: . Reviewed use, side effects, and adverse events of omeprazole . Developed taper for DC omeprazole; approved taper with Dr. Rosanna Randy  Patient Self Care Activities:  . Self administers medications as prescribed  Initial goal documentation     . Medication assistance (pt-stated)     Current Barriers:  . financial  Pharmacist Clinical Goal(s): Over the next 14 days, Mr.. Alcorta will provide the necessary supplementary documents (proof of out of pocket prescription expenditure, proof of household income) needed for medication assistance applications to CCM pharmacist.   Interventions: . CCM pharmacist will apply for medication assistance program for Victoza made by [employer], and Jardiance made by [employer].    Patient Self Care Activities:  Marland Kitchen Gather necessary documents needed to apply for medication assistance  Initial goal documentation        Other   . DIET - REDUCE CALORIE INTAKE     Recommend to continue cutting back on starches to help aid in weight loss and better diabetes control.     Marland Kitchen DIET - REDUCE SUGAR INTAKE     Recommend to cut back on sugar intake and sweets     . Reduce portion size     Recommend eating 3 small meals a day and two healthy protein snacks in between.        Fall Risk Fall Risk  08/15/2019 12/11/2018 08/10/2018 05/18/2017 05/16/2016  Falls in the past  year? 0 0 0 No No  Number falls in past yr: 0 - - - -  Injury with Fall? 0 - - - -   FALL RISK PREVENTION PERTAINING TO THE HOME:  Any stairs in or around the home? Yes If so, are there any without handrails? No  Home free of loose throw rugs in walkways, pet beds, electrical cords, etc? Yes  Adequate lighting in your home to reduce risk of falls? Yes   ASSISTIVE DEVICES UTILIZED TO PREVENT FALLS:  Life alert? No  Use of a cane, walker or w/c? Yes  Grab bars in the bathroom? No  Shower chair or bench in shower? Yes  Elevated toilet seat or a handicapped toilet? Yes    TIMED UP AND GO:  Was the test performed? No .    Depression Screen PHQ 2/9 Scores 08/15/2019 12/11/2018 08/10/2018 05/18/2017  PHQ - 2 Score 0 0 0 0    Cognitive Function: Declined today.      6CIT Screen 08/10/2018  What Year? 0 points  What month? 0 points  What time? 0 points  Count back from 20 0 points  Months in reverse 0 points  Repeat phrase 0 points  Total Score 0    Immunization History  Administered Date(s) Administered  . Fluad Quad(high Dose 65+) 07/17/2019  . Influenza, High Dose Seasonal PF 08/05/2016, 05/22/2017, 07/13/2018  . Influenza,inj,Quad PF,6+ Mos 09/11/2015  . Pneumococcal Conjugate-13 08/14/2014  . Pneumococcal Polysaccharide-23 08/02/2007, 10/22/2012  . Tdap 06/09/2010  . Zoster 10/22/2012    Qualifies for Shingles Vaccine? Yes  Zostavax completed 10/22/12. Due for Shingrix. Pt has  been advised to call insurance company to determine out of pocket expense. Advised may also receive vaccine at local pharmacy or Health Dept. Verbalized acceptance and understanding.  Tdap: Up to date  Flu Vaccine: Up to date  Pneumococcal Vaccine: Completed series  Screening Tests Health Maintenance  Topic Date Due  . FOOT EXAM  01/23/2018  . HEMOGLOBIN A1C  01/16/2020  . COLONOSCOPY  03/17/2020  . TETANUS/TDAP  06/09/2020  . OPHTHALMOLOGY EXAM  08/13/2020  . INFLUENZA VACCINE   Completed  . Hepatitis C Screening  Completed  . PNA vac Low Risk Adult  Completed   Cancer Screenings:  Colorectal Screening: Completed 03/18/15. Repeat every 5 years.  Lung Cancer Screening: (Low Dose CT Chest recommended if Age 28-80 years, 30 pack-year currently smoking OR have quit w/in 15years.) does not qualify.   Additional Screening:  Hepatitis C Screening: Up to date Dental Screening: Recommended annual dental exams for proper oral hygiene  Community Resource Referral:  CRR required this visit?  No        Plan:  I have personally reviewed and addressed the Medicare Annual Wellness questionnaire and have noted the following in the patient's chart:  A. Medical and social history B. Use of alcohol, tobacco or illicit drugs  C. Current medications and supplements D. Functional ability and status E.  Nutritional status F.  Physical activity G. Advance directives H. List of other physicians I.  Hospitalizations, surgeries, and ER visits in previous 12 months J.  [place] such as hearing and vision if needed, cognitive and depression L. Referrals and appointments   In addition, I have reviewed and discussed with patient certain preventive protocols, quality metrics, and best practice recommendations. A written personalized care plan for preventive services as well as general preventive health recommendations were provided to patient.   Glendora Score, [place]  [id number]/13/0865 Nurse Health Advisor  Nurse Notes: Pt needs a diabetic foot exam at next in office visit.

## 2019-08-15 ENCOUNTER — Other Ambulatory Visit: Payer: Self-pay

## 2019-08-15 ENCOUNTER — Ambulatory Visit (INDEPENDENT_AMBULATORY_CARE_PROVIDER_SITE_OTHER): Payer: PPO

## 2019-08-15 ENCOUNTER — Other Ambulatory Visit: Payer: Self-pay | Admitting: Family Medicine

## 2019-08-15 DIAGNOSIS — Z0000 Encounter for general adult medical examination without abnormal findings: Secondary | ICD-10-CM | POA: Diagnosis not present

## 2019-08-15 NOTE — Patient Instructions (Signed)
Mr. Andrew Lyons , Thank you for taking time to come for your Medicare Wellness Visit. I appreciate your ongoing commitment to your health goals. Please review the following plan we discussed and let me know if I can assist you in the future.   Screening recommendations/referrals: Colonoscopy: Up to date, due 02/2020 Recommended yearly ophthalmology/optometry visit for glaucoma screening and checkup Recommended yearly dental visit for hygiene and checkup  Vaccinations: Influenza vaccine: Up to date Pneumococcal vaccine: Completed series Tdap vaccine: Up to date, due 05/2020 Shingles vaccine: Pt declines today.     Advanced directives: Advance directive discussed with you today. Even though you declined this today please call our office should you change your mind and we can give you the proper paperwork for you to fill out.  Conditions/risks identified: Recommend to cut back on sweet and sugars and focus on healthier alternatives such as fruits and vegetables when snacking.   Next appointment: 01/15/20 @ 9:40 AM with [name]. Declined scheduling an AWV for 2021 at this time.   Preventive Care 14 Years and Older, Male Preventive care refers to lifestyle choices and visits with your health care provider that can promote health and wellness. What does preventive care include?  A yearly physical exam. This is also called an annual well check.  Dental exams once or twice a year.  Routine eye exams. Ask your health care provider how often you should have your eyes checked.  Personal lifestyle choices, including:  Daily care of your teeth and gums.  Regular physical activity.  Eating a healthy diet.  Avoiding tobacco and drug use.  Limiting alcohol use.  Practicing safe sex.  Taking low doses of aspirin every day.  Taking vitamin and mineral supplements as recommended by your health care provider. What happens during an annual well check? The services and screenings done by your  health care provider during your annual well check will depend on your age, overall health, lifestyle risk factors, and family history of disease. Counseling  Your health care provider may ask you questions about your:  Alcohol use.  Tobacco use.  Drug use.  Emotional well-being.  Home and relationship well-being.  Sexual activity.  Eating habits.  History of falls.  Memory and ability to understand (cognition).  Work and work Statistician. Screening  You may have the following tests or measurements:  Height, weight, and BMI.  Blood pressure.  Lipid and cholesterol levels. These may be checked every 5 years, or more frequently if you are over 29 years old.  Skin check.  Lung cancer screening. You may have this screening every year starting at age 17 if you have a 30-pack-year history of smoking and currently smoke or have quit within the past 15 years.  Fecal occult blood test (FOBT) of the stool. You may have this test every year starting at age 46.  Flexible sigmoidoscopy or colonoscopy. You may have a sigmoidoscopy every 5 years or a colonoscopy every 10 years starting at age 19.  Prostate cancer screening. Recommendations will vary depending on your family history and other risks.  Hepatitis C blood test.  Hepatitis B blood test.  Sexually transmitted disease (STD) testing.  Diabetes screening. This is done by checking your blood sugar (glucose) after you have not eaten for a while (fasting). You may have this done every 1-3 years.  Abdominal aortic aneurysm (AAA) screening. You may need this if you are a current or former smoker.  Osteoporosis. You may be screened starting at age  70 if you are at high risk. Talk with your health care provider about your test results, treatment options, and if necessary, the need for more tests. Vaccines  Your health care provider may recommend certain vaccines, such as:  Influenza vaccine. This is recommended every year.   Tetanus, diphtheria, and acellular pertussis (Tdap, Td) vaccine. You may need a Td booster every 10 years.  Zoster vaccine. You may need this after age 31.  Pneumococcal 13-valent conjugate (PCV13) vaccine. One dose is recommended after age 42.  Pneumococcal polysaccharide (PPSV23) vaccine. One dose is recommended after age 55. Talk to your health care provider about which screenings and vaccines you need and how often you need them. This information is not intended to replace advice given to you by your health care provider. Make sure you discuss any questions you have with your health care provider. Document Released: 10/09/2015 Document Revised: 06/01/2016 Document Reviewed: 07/14/2015 Elsevier Interactive Patient Education  2017 [place] Prevention in the Home Falls can cause injuries. They can happen to people of all ages. There are many things you can do to make your home safe and to help prevent falls. What can I do on the outside of my home?  Regularly fix the edges of walkways and driveways and fix any cracks.  Remove anything that might make you trip as you walk through a door, such as a raised step or threshold.  Trim any bushes or trees on the path to your home.  Use bright outdoor lighting.  Clear any walking paths of anything that might make someone trip, such as rocks or tools.  Regularly check to see if handrails are loose or broken. Make sure that both sides of any steps have handrails.  Any raised decks and porches should have guardrails on the edges.  Have any leaves, snow, or ice cleared regularly.  Use sand or salt on walking paths during winter.  Clean up any spills in your garage right away. This includes oil or grease spills. What can I do in the bathroom?  Use night lights.  Install grab bars by the toilet and in the tub and shower. Do not use towel bars as grab bars.  Use non-skid mats or decals in the tub or shower.  If you need to  sit down in the shower, use a plastic, non-slip stool.  Keep the floor dry. Clean up any water that spills on the floor as soon as it happens.  Remove soap buildup in the tub or shower regularly.  Attach bath mats securely with double-sided non-slip rug tape.  Do not have throw rugs and other things on the floor that can make you trip. What can I do in the bedroom?  Use night lights.  Make sure that you have a light by your bed that is easy to reach.  Do not use any sheets or blankets that are too big for your bed. They should not hang down onto the floor.  Have a firm chair that has side arms. You can use this for support while you get dressed.  Do not have throw rugs and other things on the floor that can make you trip. What can I do in the kitchen?  Clean up any spills right away.  Avoid walking on wet floors.  Keep items that you use a lot in easy-to-reach places.  If you need to reach something above you, use a strong step stool that has a grab bar.  Keep  electrical cords out of the way.  Do not use floor polish or wax that makes floors slippery. If you must use wax, use non-skid floor wax.  Do not have throw rugs and other things on the floor that can make you trip. What can I do with my stairs?  Do not leave any items on the stairs.  Make sure that there are handrails on both sides of the stairs and use them. Fix handrails that are broken or loose. Make sure that handrails are as long as the stairways.  Check any carpeting to make sure that it is firmly attached to the stairs. Fix any carpet that is loose or worn.  Avoid having throw rugs at the top or bottom of the stairs. If you do have throw rugs, attach them to the floor with carpet tape.  Make sure that you have a light switch at the top of the stairs and the bottom of the stairs. If you do not have them, ask someone to add them for you. What else can I do to help prevent falls?  Wear shoes that:  Do not  have high heels.  Have rubber bottoms.  Are comfortable and fit you well.  Are closed at the toe. Do not wear sandals.  If you use a stepladder:  Make sure that it is fully opened. Do not climb a closed stepladder.  Make sure that both sides of the stepladder are locked into place.  Ask someone to hold it for you, if possible.  Clearly mark and make sure that you can see:  Any grab bars or handrails.  First and last steps.  Where the edge of each step is.  Use tools that help you move around (mobility aids) if they are needed. These include:  Canes.  Walkers.  Scooters.  Crutches.  Turn on the lights when you go into a dark area. Replace any light bulbs as soon as they burn out.  Set up your furniture so you have a clear path. Avoid moving your furniture around.  If any of your floors are uneven, fix them.  If there are any pets around you, be aware of where they are.  Review your medicines with your doctor. Some medicines can make you feel dizzy. This can increase your chance of falling. Ask your doctor what other things that you can do to help prevent falls. This information is not intended to replace advice given to you by your health care provider. Make sure you discuss any questions you have with your health care provider. Document Released: 07/09/2009 Document Revised: 02/18/2016 Document Reviewed: 10/17/2014 Elsevier Interactive Patient Education  2017 [employer].

## 2019-09-26 ENCOUNTER — Ambulatory Visit: Payer: PPO | Attending: Internal Medicine

## 2019-09-26 DIAGNOSIS — Z20822 Contact with and (suspected) exposure to covid-19: Secondary | ICD-10-CM

## 2019-10-02 LAB — NOVEL CORONAVIRUS, NAA

## 2019-10-15 DIAGNOSIS — I6523 Occlusion and stenosis of bilateral carotid arteries: Secondary | ICD-10-CM | POA: Diagnosis not present

## 2019-10-15 DIAGNOSIS — E782 Mixed hyperlipidemia: Secondary | ICD-10-CM | POA: Diagnosis not present

## 2019-10-15 DIAGNOSIS — I25118 Atherosclerotic heart disease of native coronary artery with other forms of angina pectoris: Secondary | ICD-10-CM | POA: Diagnosis not present

## 2019-10-15 DIAGNOSIS — I10 Essential (primary) hypertension: Secondary | ICD-10-CM | POA: Diagnosis not present

## 2019-10-15 DIAGNOSIS — Q231 Congenital insufficiency of aortic valve: Secondary | ICD-10-CM | POA: Diagnosis not present

## 2019-10-17 DIAGNOSIS — L918 Other hypertrophic disorders of the skin: Secondary | ICD-10-CM | POA: Diagnosis not present

## 2019-10-17 DIAGNOSIS — L720 Epidermal cyst: Secondary | ICD-10-CM | POA: Diagnosis not present

## 2019-10-17 DIAGNOSIS — D225 Melanocytic nevi of trunk: Secondary | ICD-10-CM | POA: Diagnosis not present

## 2019-10-17 DIAGNOSIS — L858 Other specified epidermal thickening: Secondary | ICD-10-CM | POA: Diagnosis not present

## 2019-10-17 DIAGNOSIS — Z1283 Encounter for screening for malignant neoplasm of skin: Secondary | ICD-10-CM | POA: Diagnosis not present

## 2019-10-17 DIAGNOSIS — Z86018 Personal history of other benign neoplasm: Secondary | ICD-10-CM | POA: Diagnosis not present

## 2019-10-17 DIAGNOSIS — L578 Other skin changes due to chronic exposure to nonionizing radiation: Secondary | ICD-10-CM | POA: Diagnosis not present

## 2019-10-27 ENCOUNTER — Other Ambulatory Visit: Payer: Self-pay | Admitting: Family Medicine

## 2019-10-27 DIAGNOSIS — I10 Essential (primary) hypertension: Secondary | ICD-10-CM

## 2019-10-27 DIAGNOSIS — E118 Type 2 diabetes mellitus with unspecified complications: Secondary | ICD-10-CM

## 2019-10-29 DIAGNOSIS — I25118 Atherosclerotic heart disease of native coronary artery with other forms of angina pectoris: Secondary | ICD-10-CM | POA: Diagnosis not present

## 2019-11-02 ENCOUNTER — Other Ambulatory Visit: Payer: Self-pay | Admitting: Family Medicine

## 2019-11-02 DIAGNOSIS — E118 Type 2 diabetes mellitus with unspecified complications: Secondary | ICD-10-CM

## 2019-11-02 NOTE — Telephone Encounter (Signed)
Requested Prescriptions  Pending Prescriptions Disp Refills  . metFORMIN (GLUCOPHAGE) 500 MG tablet [Pharmacy Med Name: METFORMIN HCL 500 MG TABLET] 180 tablet 3    Sig: TAKE 1 TABLET (500 MG TOTAL) BY MOUTH 2 (TWO) TIMES DAILY WITH A MEAL.     Endocrinology:  Diabetes - Biguanides Passed - 11/02/2019 10:00 AM      Passed - Cr in normal range and within 360 days    Creatinine, Ser  Date Value Ref Range Status  07/18/2019 1.03 0.76 - 1.27 mg/dL Final         Passed - HBA1C is between 0 and 7.9 and within 180 days    Hgb A1c MFr Bld  Date Value Ref Range Status  07/18/2019 6.2 (H) 4.8 - 5.6 % Final    Comment:             Prediabetes: 5.7 - 6.4          Diabetes: >6.4          Glycemic control for adults with diabetes: <7.0          Passed - eGFR in normal range and within 360 days    GFR calc Af Amer  Date Value Ref Range Status  07/18/2019 84 >59 mL/min/1.73 Final   GFR calc non Af Amer  Date Value Ref Range Status  07/18/2019 72 >59 mL/min/1.73 Final         Passed - Valid encounter within last 6 months    Recent Outpatient Visits          3 months ago Type 2 diabetes mellitus with other circulatory complication, without long-term current use of insulin [facility])   [facility] [name]., MD   6 months ago Type 2 diabetes mellitus with other circulatory complication, without long-term current use of insulin [facility])   [facility] [name]., MD   10 months ago Type 2 diabetes mellitus with other circulatory complication, without long-term current use of insulin [facility])   [facility] [name]., MD   11 months ago Amputation stump infection [facility])   [facility] [name]., MD   12 months ago Amputation stump infection [facility])   [facility] [name]., MD

## 2019-12-04 DIAGNOSIS — I25118 Atherosclerotic heart disease of native coronary artery with other forms of angina pectoris: Secondary | ICD-10-CM | POA: Diagnosis not present

## 2019-12-04 DIAGNOSIS — Q231 Congenital insufficiency of aortic valve: Secondary | ICD-10-CM | POA: Diagnosis not present

## 2019-12-04 DIAGNOSIS — I6523 Occlusion and stenosis of bilateral carotid arteries: Secondary | ICD-10-CM | POA: Diagnosis not present

## 2019-12-10 DIAGNOSIS — E782 Mixed hyperlipidemia: Secondary | ICD-10-CM | POA: Diagnosis not present

## 2019-12-10 DIAGNOSIS — I6523 Occlusion and stenosis of bilateral carotid arteries: Secondary | ICD-10-CM | POA: Diagnosis not present

## 2019-12-10 DIAGNOSIS — I10 Essential (primary) hypertension: Secondary | ICD-10-CM | POA: Diagnosis not present

## 2019-12-10 DIAGNOSIS — I2510 Atherosclerotic heart disease of native coronary artery without angina pectoris: Secondary | ICD-10-CM | POA: Diagnosis not present

## 2019-12-10 DIAGNOSIS — Q231 Congenital insufficiency of aortic valve: Secondary | ICD-10-CM | POA: Diagnosis not present

## 2019-12-11 DIAGNOSIS — E1140 Type 2 diabetes mellitus with diabetic neuropathy, unspecified: Secondary | ICD-10-CM | POA: Diagnosis not present

## 2019-12-11 DIAGNOSIS — B351 Tinea unguium: Secondary | ICD-10-CM | POA: Diagnosis not present

## 2019-12-11 DIAGNOSIS — L600 Ingrowing nail: Secondary | ICD-10-CM | POA: Diagnosis not present

## 2019-12-11 DIAGNOSIS — Z89511 Acquired absence of right leg below knee: Secondary | ICD-10-CM | POA: Diagnosis not present

## 2019-12-31 ENCOUNTER — Other Ambulatory Visit: Payer: Self-pay | Admitting: Family Medicine

## 2019-12-31 DIAGNOSIS — J301 Allergic rhinitis due to pollen: Secondary | ICD-10-CM

## 2019-12-31 NOTE — Telephone Encounter (Signed)
Requested Prescriptions  Pending Prescriptions Disp Refills  . omeprazole (PRILOSEC) 40 MG capsule [Pharmacy Med Name: OMEPRAZOLE DR 40 MG CAPSULE] 90 capsule 3    Sig: TAKE 1 CAPSULE BY MOUTH EVERY DAY     Gastroenterology: Proton Pump Inhibitors Passed - 12/31/2019  1:33 AM      Passed - Valid encounter within last 12 months    Recent Outpatient Visits          5 months ago Type 2 diabetes mellitus with other circulatory complication, without long-term current use of insulin ([place])   [facility] [name]., MD   8 months ago Type 2 diabetes mellitus with other circulatory complication, without long-term current use of insulin ([place])   [facility] [name]., MD   1 year ago Type 2 diabetes mellitus with other circulatory complication, without long-term current use of insulin [facility])   [facility] [name]., MD   1 year ago Amputation stump infection [facility])   [facility] [name]., MD   1 year ago Amputation stump infection [facility])   [facility] [name]., MD             . montelukast (SINGULAIR) 10 MG tablet [Pharmacy Med Name: MONTELUKAST SOD 10 MG TABLET] 90 tablet 3    Sig: TAKE 1 TABLET BY MOUTH EVERY DAY     Pulmonology:  Leukotriene Inhibitors Passed - 12/31/2019  1:33 AM      Passed - Valid encounter within last 12 months    Recent Outpatient Visits          5 months ago Type 2 diabetes mellitus with other circulatory complication, without long-term current use of insulin ([place])   [facility] [name]., MD   8 months ago Type 2 diabetes mellitus with other circulatory complication, without long-term current use of insulin ([place])   [facility] [name][facility] MD   1 year ago Type 2 diabetes mellitus with other circulatory complication, without long-term current use of insulin [facility])   [facility] [name]., MD   1 year ago Amputation stump infection [facility])   [facility] [name]., MD   1 year ago Amputation stump infection [facility])   [facility] [name]., MD             . metoprolol succinate (TOPROL-XL) 100 MG 24 hr tablet [Pharmacy Med Name: METOPROLOL SUCC ER 100 MG TAB] 90 tablet 3    Sig: TAKE 1 TABLET BY MOUTH EVERY DAY     Cardiovascular:  Beta Blockers Failed - 12/31/2019  1:33 AM      Failed - Last BP in normal range    BP Readings from Last 1 Encounters:  08/09/19 (!) 146/75         Passed - Last Heart Rate in normal range    Pulse Readings from Last 1 Encounters:  08/09/19 68         Passed - Valid encounter within last 6 months    Recent Outpatient Visits          5 months ago Type 2 diabetes mellitus with other circulatory complication, without long-term current use of insulin [facility])   [facility] [name]., MD   8 months ago Type 2 diabetes mellitus with other circulatory complication, without long-term current use  of insulin [facility])   [name] MD   1 year ago Type 2 diabetes mellitus with other circulatory complication, without long-term current use of insulin [facility])   [facility] [name]., MD   1 year ago Amputation stump infection [facility])   [facility] [name]., MD   1 year ago Amputation stump infection [facility])   [facility] [name]., MD

## 2020-01-07 ENCOUNTER — Other Ambulatory Visit: Payer: Self-pay | Admitting: Family Medicine

## 2020-01-07 MED ORDER — ROSUVASTATIN CALCIUM 20 MG PO TABS: 20.0000 mg | ORAL_TABLET | Freq: Every day | ORAL | 0 refills | Status: DC

## 2020-01-07 MED ORDER — VICTOZA 18 MG/3ML ~~LOC~~ SOPN: PEN_INJECTOR | SUBCUTANEOUS | 5 refills | Status: DC

## 2020-01-07 MED ORDER — JARDIANCE 25 MG PO TABS: 25.0000 mg | ORAL_TABLET | Freq: Every day | ORAL | 0 refills | Status: DC

## 2020-01-07 NOTE — Telephone Encounter (Signed)
[place] pharm is calling and pt needs new rxs jardiance 25 mg, rosuvastatin 20 mg and victoza  90 day supply w/refills. Pt has an appt on 01/15/2020

## 2020-01-07 NOTE — Telephone Encounter (Signed)
Requested Prescriptions  Pending Prescriptions Disp Refills  . empagliflozin (JARDIANCE) 25 MG TABS tablet 90 tablet 0    Sig: Take 25 mg by mouth daily.     Endocrinology:  Diabetes - SGLT2 Inhibitors Failed - 01/07/2020 11:27 AM      Failed - LDL in normal range and within 360 days    LDL Cholesterol (Calc)  Date Value Ref Range Status  09/21/2017 71 mg/dL (calc) Final    Comment:    Reference range: <100 . Desirable range <100 mg/dL for primary prevention;   <70 mg/dL for patients with CHD or diabetic patients  with > or = 2 CHD risk factors. . LDL-C is now calculated using the Martin-Hopkins  calculation, which is a validated novel method providing  better accuracy than the Friedewald equation in the  estimation of LDL-C.  Martin SS et al. JAMA. 2013;310(19): 2061-2068  (http://education.[employer].com/faq/FAQ164)    LDL Chol Calc (NIH)  Date Value Ref Range Status  07/18/2019 71 0 - 99 mg/dL Final         Passed - Cr in normal range and within 360 days    Creatinine, Ser  Date Value Ref Range Status  07/18/2019 1.03 0.76 - 1.27 mg/dL Final         Passed - HBA1C is between 0 and 7.9 and within 180 days    Hgb A1c MFr Bld  Date Value Ref Range Status  07/18/2019 6.2 (H) 4.8 - 5.6 % Final    Comment:             Prediabetes: 5.7 - 6.4          Diabetes: >6.4          Glycemic control for adults with diabetes: <7.0          Passed - eGFR in normal range and within 360 days    GFR calc Af Amer  Date Value Ref Range Status  07/18/2019 84 >59 mL/min/1.73 Final   GFR calc non Af Amer  Date Value Ref Range Status  07/18/2019 72 >59 mL/min/1.73 Final         Passed - Valid encounter within last 6 months    Recent Outpatient Visits          5 months ago Type 2 diabetes mellitus with other circulatory complication, without long-term current use of insulin (HCC)   [facility], [name] L Jr., MD   8 months ago Type 2 diabetes mellitus  with other circulatory complication, without long-term current use of insulin (HCC)   [facility] [name] L Jr., MD   1 year ago Type 2 diabetes mellitus with other circulatory complication, without long-term current use of insulin (HCC)   [facility], [name] L Jr., MD   1 year ago Amputation stump infection (HCC)   [facility], [name] L Jr., MD   1 year ago Amputation stump infection (HCC)    [facility], [name] L Jr., MD             . rosuvastatin (CRESTOR) 20 MG tablet 90 tablet 0    Sig: Take 1 tablet (20 mg total) by mouth daily.     Cardiovascular:  Antilipid - Statins Failed - 01/07/2020 11:27 AM      Failed - LDL in normal range and within 360 days    LDL Cholesterol (Calc)  Date Value Ref Range Status  09/21/2017 71 mg/dL (calc) Final      Comment:    Reference range: <100 . Desirable range <100 mg/dL for primary prevention;   <70 mg/dL for patients with CHD or diabetic patients  with > or = 2 CHD risk factors. . LDL-C is now calculated using the Martin-Hopkins  calculation, which is a validated novel method providing  better accuracy than the Friedewald equation in the  estimation of LDL-C.  Martin SS et al. JAMA. 2013;310(19): 2061-2068  (http://education.[facility].com/faq/FAQ164)    LDL Chol Calc (NIH)  Date Value Ref Range Status  07/18/2019 71 0 - 99 mg/dL Final         Failed - HDL in normal range and within 360 days    HDL  Date Value Ref Range Status  07/18/2019 35 (L) >39 mg/dL Final         Passed - Total Cholesterol in normal range and within 360 days    Cholesterol, Total  Date Value Ref Range Status  07/18/2019 130 100 - 199 mg/dL Final         Passed - Triglycerides in normal range and within 360 days    Triglycerides  Date Value Ref Range Status  07/18/2019 138 0 - 149 mg/dL Final         Passed - Patient is not pregnant      Passed  - Valid encounter within last 12 months    Recent Outpatient Visits          5 months ago Type 2 diabetes mellitus with other circulatory complication, without long-term current use of insulin (HCC)   [facility], [name] L Jr., MD   8 months ago Type 2 diabetes mellitus with other circulatory complication, without long-term current use of insulin (HCC)   [facility], [name] L Jr., MD   1 year ago Type 2 diabetes mellitus with other circulatory complication, without long-term current use of insulin (HCC)   [facility], [name] L Jr., MD   1 year ago Amputation stump infection (HCC)   [facility], [name] L Jr., MD   1 year ago Amputation stump infection (HCC)   [facility], [name] L Jr., MD             . liraglutide (VICTOZA) 18 MG/3ML SOPN 5 pen 5    Sig: INJECT 1.2 SUBCUTANEOUSLY EVERY DAY     Endocrinology:  Diabetes - GLP-1 Receptor Agonists Passed - 01/07/2020 11:27 AM      Passed - HBA1C is between 0 and 7.9 and within 180 days    Hgb A1c MFr Bld  Date Value Ref Range Status  07/18/2019 6.2 (H) 4.8 - 5.6 % Final    Comment:             Prediabetes: 5.7 - 6.4          Diabetes: >6.4          Glycemic control for adults with diabetes: <7.0          Passed - Valid encounter within last 6 months    Recent Outpatient Visits          5 months ago Type 2 diabetes mellitus with other circulatory complication, without long-term current use of insulin (HCC)   [facility], [name] L Jr., MD   8 months ago Type 2 diabetes mellitus with other circulatory complication, without long-term current use of insulin (HCC)   [facility], [name] L Jr., MD   1 year   ago Type 2 diabetes mellitus with other circulatory complication, without long-term current use of insulin (HCC)   [facility], [name] L Jr., MD    1 year ago Amputation stump infection (HCC)   [facility], [name] L Jr., MD   1 year ago Amputation stump infection (HCC)   [facility], [name] L Jr., MD              Due for appt in 1 month 

## 2020-01-14 NOTE — Progress Notes (Signed)
Complete physical exam   I,Andrew Lyons,acting as a scribe for Andrew Durie, MD.,have documented all relevant documentation on the behalf of Andrew Durie, MD,as directed by  Andrew Durie, MD while in the presence of Andrew Durie, MD.  Patient: Andrew Lyons   DOB: Sep 23, 1946   73 y.o. Male  MRN: [id number] Visit Date: 01/15/2020  Today's healthcare provider: [name], MD  Subjective:    Chief Complaint  Patient presents with  . Annual Exam  . Diabetes Mellitus  . Hypertension  . Hyperlipidemia    Andrew Lyons is a 74 y.o. male who presents today for a complete physical exam.  He reports consuming a general diet. Home exercise routine includes walking. He generally feels well. He reports sleeping poorly. He does not have additional problems to discuss today.  HPI  Overall patient has been feeling well.  He has gained weight during the Covid pandemic and is having more difficulty moving recent years especially with his BKA affecting his ambulation.  His stump is completely healed from what it was infected.  Past Medical History:  Diagnosis Date  . 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 Barrett's esophagus with dysplasia   . Cataract   . COPD (chronic obstructive pulmonary disease) ([place])   . Coronary artery disease   . Diabetes mellitus without complication ([place])   . GERD (gastroesophageal reflux disease)   . Hepatitis   . Hx of dysplastic nevus 2007   multiple sites   . Hyperlipidemia   . Hypertension   . Myocardial infarction ([place])   . Seizures ([place])   . Sleep apnea    Past Surgical History:  Procedure Laterality Date  . ABDOMINAL AORTOGRAM W/LOWER EXTREMITY N/A 06/20/2018   Procedure: ABDOMINAL AORTOGRAM W/LOWER EXTREMITY;  Surgeon: Wellington Hampshire, MD;  Location: [place] [facility];  Service: Cardiovascular;  Laterality: N/A;  . AMPUTATION Right 10/27/2018   Procedure: AMPUTATION BELOW KNEE;  Surgeon: Leim Fabry, MD;  Location: [facility] ORS;  Service: Orthopedics;  Laterality: Right;  . CARDIAC CATHETERIZATION     with Angioplasty  . cardiac stents    . CARDIAC VALVE REPLACEMENT     Aortic Valve Replacement  . COLONOSCOPY WITH PROPOFOL N/A 03/18/2015   Procedure: COLONOSCOPY WITH PROPOFOL;  Surgeon: [name], MD;  Location: [facility] ENDOSCOPY;  Service: Endoscopy;  Laterality: N/A;  . ESOPHAGOGASTRODUODENOSCOPY N/A 03/18/2015   Procedure: ESOPHAGOGASTRODUODENOSCOPY (EGD);  Surgeon: [name], MD;  Location: [facility] ENDOSCOPY;  Service: Endoscopy;  Laterality: N/A;  . FOOT AMPUTATION    . GREEN LIGHT LASER TURP (TRANSURETHRAL RESECTION OF PROSTATE N/A 10/16/2018   Procedure: GREEN LIGHT LASER TURP (TRANSURETHRAL RESECTION OF PROSTATE;  Surgeon: Royston Cowper, MD;  Location: [facility] ORS;  Service: Urology;  Laterality: N/A;  . HARDWARE REMOVAL Right 10/27/2018   Procedure: HARDWARE REMOVAL;  Surgeon: Leim Fabry, MD;  Location: [facility];  Service: Orthopedics;  Laterality: Right;  . LOWER EXTREMITY ANGIOGRAPHY Right 10/25/2018   Procedure: Lower Extremity Angiography;  Surgeon: Algernon Huxley, MD;  Location: [place] [facility];  Service: Cardiovascular;  Laterality: Right;  . PERIPHERAL VASCULAR INTERVENTION Right 06/20/2018   Procedure: PERIPHERAL VASCULAR INTERVENTION;  Surgeon: Wellington Hampshire, MD;  Location: [place] [facility];  Service: Cardiovascular;  Laterality: Right;  . THORACOTOMY Right   . TONSILLECTOMY     Social History   Socioeconomic History  . Marital status: Married  Spouse name: Not on file  . Number of children: 2  . Years of education: Not on file  . Highest education level: 8th grade  Occupational History  . Occupation: retired  Tobacco Use  . Smoking status: Former Smoker    Quit date: 10/02/2001    Years since quitting: 18.2  . Smokeless tobacco: Former Systems developer  . Tobacco comment: Quit smoking in 2003; Started smoking at age 80, smoked about 40 years, smoked over  3 packs per day  Substance and Sexual Activity  . Alcohol use: Not Currently  . Drug use: No  . Sexual activity: Not on file  Other Topics Concern  . Not on file  Social History Narrative  . Not on file   Social Determinants of Health   Financial Resource Strain:   . Difficulty of Paying Living Expenses:   Food Insecurity:   . Worried About Charity fundraiser in the Last Year:   . Arboriculturist in the Last Year:   Transportation Needs:   . Film/video editor (Medical):   Marland Kitchen Lack of Transportation (Non-Medical):   Physical Activity:   . Days of Exercise per Week:   . Minutes of Exercise per Session:   Stress:   . Feeling of Stress :   Social Connections:   . Frequency of Communication with Friends and Family:   . Frequency of Social Gatherings with Friends and Family:   . Attends Religious Services:   . Active Member of Clubs or Organizations:   . Attends Archivist Meetings:   Marland Kitchen Marital Status:   Intimate Partner Violence:   . Fear of Current or Ex-Partner:   . Emotionally Abused:   Marland Kitchen Physically Abused:   . Sexually Abused:    Family Status  Relation Name Status  . Son  Alive  . Brother #2 Alive  . Mother  Deceased at age 50       died from an MI  . Father  Deceased at age 46       died from an MI;  . Sister  Alive  . Brother #1 Deceased at age 75       cause of death was complications after a bypass surgery  . Daughter  Alive   Family History  Problem Relation Age of Onset  . Obesity Son   . Diabetes Brother   . Hypertension Brother   . Heart attack Mother   . Heart attack Father    Allergies  Allergen Reactions  . Iodinated Diagnostic Agents Rash    had a red chest -unusre of what kind of dye it was  . Zyban [Bupropion] Rash  . Zocor [Simvastatin] Other (See Comments)    Reaction: hepatitis   . Gadolinium Derivatives Rash    Patient Care Team: [name]., MD as PCP - General (Family Medicine) Cathi Roan, [facility]  (Pharmacist) Corey Skains, MD as Consulting Physician (Cardiology) Lucky Cowboy Erskine Squibb, MD as Referring Physician (Vascular Surgery) Ralene Bathe, MD (Dermatology) Earnestine Leys, MD (Orthopedic Surgery)   Medications: Outpatient Medications Prior to Visit  Medication Sig  . amLODipine-benazepril (LOTREL) 5-40 MG capsule TAKE 1 CAPSULE BY MOUTH EVERY DAY  . B-D UF III MINI PEN NEEDLES 31G X 5 MM MISC USE WITH PEN DAILY  . Blood Glucose Monitoring Suppl (ONE TOUCH ULTRA SYSTEM KIT) w/Device KIT To use daily to check sugar. DX E11.9-needs one touch ultra meter  . cholecalciferol (VITAMIN D) 25  MCG (1000 UT) tablet Take 1,000 Units by mouth daily.   . Coenzyme Q10 100 MG TABS Take 100 mg by mouth daily.   . empagliflozin (JARDIANCE) 25 MG TABS tablet Take 25 mg by mouth daily.  . fluticasone (FLONASE) 50 MCG/ACT nasal spray Place 2 sprays into both nostrils daily.  . Lancets (ONETOUCH ULTRASOFT) lancets USE 1  TO CHECK GLUCOSE THREE TIMES DAILY  . liraglutide (VICTOZA) 18 MG/3ML SOPN INJECT 1.2 SUBCUTANEOUSLY EVERY DAY  . metFORMIN (GLUCOPHAGE) 500 MG tablet TAKE 1 TABLET (500 MG TOTAL) BY MOUTH 2 (TWO) TIMES DAILY WITH A MEAL.  . metoprolol succinate (TOPROL-XL) 100 MG 24 hr tablet TAKE 1 TABLET BY MOUTH EVERY DAY  . montelukast (SINGULAIR) 10 MG tablet TAKE 1 TABLET BY MOUTH EVERY DAY  . omeprazole (PRILOSEC) 40 MG capsule TAKE 1 CAPSULE BY MOUTH EVERY DAY  . ONETOUCH ULTRA test strip USE 1 STRIP TO CHECK GLUCOSE TWICE DAILY AS NEEDED  . rosuvastatin (CRESTOR) 20 MG tablet Take 1 tablet (20 mg total) by mouth daily.  . vitamin B-12 (CYANOCOBALAMIN) 1000 MCG tablet Take 1,000 mcg by mouth daily.  Marland Kitchen oxyCODONE (OXY IR/ROXICODONE) 5 MG immediate release tablet Take 1 tablet (5 mg total) by mouth every 6 (six) hours as needed for moderate pain. (Patient not taking: Reported on 01/15/2020)  . sildenafil (VIAGRA) 100 MG tablet Take 100 mg by mouth as needed for erectile dysfunction.    No  facility-administered medications prior to visit.    Review of Systems  Constitutional: Negative.   HENT: Positive for drooling, hearing loss, rhinorrhea and tinnitus.   Eyes: Positive for itching and visual disturbance.  Respiratory: Positive for shortness of breath.   Cardiovascular: Negative.   Gastrointestinal: Negative.   Endocrine: Negative.   Genitourinary: Positive for difficulty urinating.  Musculoskeletal: Negative.   Skin: Negative.   Allergic/Immunologic: Negative.   Neurological: Negative.   Hematological: Negative.   Psychiatric/Behavioral: Positive for sleep disturbance.        Objective:    BP (!) 146/75 (BP Location: Left Arm, Patient Position: Sitting, Cuff Size: Large)   Pulse 62   Temp (!) 97.5 F (36.4 C) (Temporal)   Ht _0  (1.778 m)   Wt 254 lb (115.2 kg)   BMI 36.45 kg/m    Physical Exam Vitals reviewed.  Constitutional:      Appearance: He is well-developed. He is obese.  HENT:     Head: Normocephalic and atraumatic.     Right Ear: External ear normal.     Left Ear: External ear normal.     Nose: Nose normal.  Eyes:     General: No scleral icterus.    Conjunctiva/sclera: Conjunctivae normal.  Neck:     Thyroid: No thyromegaly.  Cardiovascular:     Rate and Rhythm: Normal rate and regular rhythm.     Heart sounds: Normal heart sounds.  Pulmonary:     Effort: Pulmonary effort is normal.     Breath sounds: Normal breath sounds.  Abdominal:     Palpations: Abdomen is soft.  Genitourinary:    Penis: Normal.      Testes: Normal.  Musculoskeletal:     Comments: S/p Right BKA.  Skin:    General: Skin is warm and dry.  Neurological:     General: No focal deficit present.     Mental Status: He is alert and oriented to person, place, and time.     Comments: He has what appears to be a rest  tremor of his right hand.  He states this does not bother him and he has no other complaints  Psychiatric:        Mood and Affect: Mood normal.         Behavior: Behavior normal.        Thought Content: Thought content normal.        Judgment: Judgment normal.       Depression Screen  PHQ 2/9 Scores 08/15/2019 12/11/2018 08/10/2018  PHQ - 2 Score 0 0 0    No results found for any visits on 01/15/20.    Assessment & Plan:    Routine Health Maintenance and Physical Exam  Exercise Activities and Dietary recommendations Goals    . DIET - REDUCE CALORIE INTAKE     Recommend to continue cutting back on starches to help aid in weight loss and better diabetes control.     Marland Kitchen DIET - REDUCE SUGAR INTAKE     Recommend to cut back on sweet and sugars and focus on healthier alternatives such as fruits and vegetables when snacking.     . I would like to come off of my Prilosec (pt-stated)     Current Barriers:  Pill burden  Pharmacist Clinical Goal(s):  Marland Kitchen Over the next 30 days, patient and pharmacist will work together to develop a taper to come off of omeprazole safely and with minimal side effects as reported by patient.   Interventions: . Reviewed use, side effects, and adverse events of omeprazole . Developed taper for DC omeprazole; approved taper with Dr. Rosanna Randy  Patient Self Care Activities:  . Self administers medications as prescribed  Initial goal documentation     . Medication assistance (pt-stated)     Current Barriers:  . financial  Pharmacist Clinical Goal(s): Over the next 14 days, Mr.. Hartog will provide the necessary supplementary documents (proof of out of pocket prescription expenditure, proof of household income) needed for medication assistance applications to [facility] pharmacist.   Interventions: . [facility] pharmacist will apply for medication assistance program for Victoza made by [employer], and Jardiance made by [employer].    Patient Self Care Activities:  Marland Kitchen Gather necessary documents needed to apply for medication assistance  Initial goal documentation      . Reduce portion size      Recommend eating 3 small meals a day and two healthy protein snacks in between.        Immunization History  Administered Date(s) Administered  . Fluad Quad(high Dose 65+) 07/17/2019  . Influenza, High Dose Seasonal PF 08/05/2016, 05/22/2017, 07/13/2018  . Influenza,inj,Quad PF,6+ Mos 09/11/2015  . Pneumococcal Conjugate-13 08/14/2014  . Pneumococcal Polysaccharide-23 08/02/2007, 10/22/2012  . Tdap 06/09/2010  . Zoster 10/22/2012    Health Maintenance  Topic Date Due  . COVID-19 Vaccine (1) Never done  . FOOT EXAM  01/23/2018  . HEMOGLOBIN A1C  01/16/2020  . COLONOSCOPY  03/17/2020  . INFLUENZA VACCINE  04/26/2020  . TETANUS/TDAP  06/09/2020  . OPHTHALMOLOGY EXAM  08/13/2020  . Hepatitis C Screening  Completed  . PNA vac Low Risk Adult  Completed    Discussed health benefits of physical activity, and encouraged him to engage in regular exercise appropriate for his age and condition.  1. Annual physical exam Lifestyle stressed.  2. Essential hypertension  - Hemoglobin A1c - Comprehensive metabolic panel - TSH - Lipid panel - CBC with Differential/Platelet  3. Pure hypercholesterolemia  - Hemoglobin A1c - Comprehensive metabolic panel - TSH - Lipid  panel - CBC with Differential/Platelet  4. Type 2 diabetes mellitus with other circulatory complication, without long-term current use of insulin (HCC)  - Hemoglobin A1c - Comprehensive metabolic panel - TSH - Lipid panel - CBC with Differential/Platelet - POCT UA - Microalbumin  5. Encounter for Medicare annual wellness exam  - Hemoglobin A1c - Comprehensive metabolic panel - TSH - Lipid panel - CBC with Differential/Platelet  6. Adenoma of colon Last colonoscopy 2015.  Refer back for follow-up. - Ambulatory referral to Gastroenterology  7. Obstructive apnea   8. Barrett's esophagus with low grade dysplasia   9. Arteriosclerosis of coronary artery All risk factors treated 10.  Status post aortic  valve replacement   11.  BPH/ED Refill Cialis 5 mg daily which is helped both.   No follow-ups on file.        Mackensey Bolte [name], MD  [facility] [phone] (phone) [phone] (fax)  [place]

## 2020-01-15 ENCOUNTER — Other Ambulatory Visit: Payer: Self-pay

## 2020-01-15 ENCOUNTER — Ambulatory Visit (INDEPENDENT_AMBULATORY_CARE_PROVIDER_SITE_OTHER): Payer: PPO | Admitting: Family Medicine

## 2020-01-15 ENCOUNTER — Encounter: Payer: Self-pay | Admitting: Family Medicine

## 2020-01-15 VITALS — BP 146/75 | HR 62 | Temp 97.5°F | Ht 70.0 in | Wt 254.0 lb

## 2020-01-15 DIAGNOSIS — Z0000 Encounter for general adult medical examination without abnormal findings: Secondary | ICD-10-CM | POA: Diagnosis not present

## 2020-01-15 DIAGNOSIS — N529 Male erectile dysfunction, unspecified: Secondary | ICD-10-CM

## 2020-01-15 DIAGNOSIS — I10 Essential (primary) hypertension: Secondary | ICD-10-CM | POA: Diagnosis not present

## 2020-01-15 DIAGNOSIS — Z86010 Personal history of colonic polyps: Secondary | ICD-10-CM

## 2020-01-15 DIAGNOSIS — E7800 Pure hypercholesterolemia, unspecified: Secondary | ICD-10-CM

## 2020-01-15 DIAGNOSIS — E1159 Type 2 diabetes mellitus with other circulatory complications: Secondary | ICD-10-CM | POA: Diagnosis not present

## 2020-01-15 DIAGNOSIS — R3911 Hesitancy of micturition: Secondary | ICD-10-CM | POA: Diagnosis not present

## 2020-01-15 DIAGNOSIS — G4733 Obstructive sleep apnea (adult) (pediatric): Secondary | ICD-10-CM

## 2020-01-15 DIAGNOSIS — D369 Benign neoplasm, unspecified site: Secondary | ICD-10-CM

## 2020-01-15 DIAGNOSIS — K22710 Barrett's esophagus with low grade dysplasia: Secondary | ICD-10-CM

## 2020-01-15 DIAGNOSIS — N401 Enlarged prostate with lower urinary tract symptoms: Secondary | ICD-10-CM | POA: Diagnosis not present

## 2020-01-15 DIAGNOSIS — I2510 Atherosclerotic heart disease of native coronary artery without angina pectoris: Secondary | ICD-10-CM

## 2020-01-15 DIAGNOSIS — Z860101 Personal history of adenomatous and serrated colon polyps: Secondary | ICD-10-CM

## 2020-01-15 LAB — POCT UA - MICROALBUMIN: Microalbumin Ur, POC: 50 mg/L

## 2020-01-16 MED ORDER — TADALAFIL 5 MG PO TABS: 5.0000 mg | ORAL_TABLET | Freq: Every day | ORAL | 4 refills | Status: DC | PRN

## 2020-01-17 DIAGNOSIS — I10 Essential (primary) hypertension: Secondary | ICD-10-CM | POA: Diagnosis not present

## 2020-01-17 DIAGNOSIS — E7800 Pure hypercholesterolemia, unspecified: Secondary | ICD-10-CM | POA: Diagnosis not present

## 2020-01-17 DIAGNOSIS — E1159 Type 2 diabetes mellitus with other circulatory complications: Secondary | ICD-10-CM | POA: Diagnosis not present

## 2020-01-17 DIAGNOSIS — Z0000 Encounter for general adult medical examination without abnormal findings: Secondary | ICD-10-CM | POA: Diagnosis not present

## 2020-01-18 LAB — CBC WITH DIFFERENTIAL/PLATELET
Basophils Absolute: 0 10*3/uL (ref 0.0–0.2)
Basos: 1 %
EOS (ABSOLUTE): 0.2 10*3/uL (ref 0.0–0.4)
Eos: 3 %
Hematocrit: 42.4 % (ref 37.5–51.0)
Hemoglobin: 14.3 g/dL (ref 13.0–17.7)
Immature Grans (Abs): 0 10*3/uL (ref 0.0–0.1)
Immature Granulocytes: 0 %
Lymphocytes Absolute: 1.1 10*3/uL (ref 0.7–3.1)
Lymphs: 20 %
MCH: 31.3 pg (ref 26.6–33.0)
MCHC: 33.7 g/dL (ref 31.5–35.7)
MCV: 93 fL (ref 79–97)
Monocytes Absolute: 0.5 10*3/uL (ref 0.1–0.9)
Monocytes: 9 %
Neutrophils Absolute: 3.8 10*3/uL (ref 1.4–7.0)
Neutrophils: 67 %
Platelets: 169 10*3/uL (ref 150–450)
RBC: 4.57 x10E6/uL (ref 4.14–5.80)
RDW: 14.2 % (ref 11.6–15.4)
WBC: 5.6 10*3/uL (ref 3.4–10.8)

## 2020-01-18 LAB — LIPID PANEL
Chol/HDL Ratio: 3.7 ratio (ref 0.0–5.0)
Cholesterol, Total: 130 mg/dL (ref 100–199)
HDL: 35 mg/dL — ABNORMAL LOW (ref >39)
LDL Chol Calc (NIH): 68 mg/dL (ref 0–99)
Triglycerides: 156 mg/dL — ABNORMAL HIGH (ref 0–149)
VLDL Cholesterol Cal: 27 mg/dL (ref 5–40)

## 2020-01-18 LAB — COMPREHENSIVE METABOLIC PANEL
ALT: 7 IU/L (ref 0–44)
AST: 16 IU/L (ref 0–40)
Albumin/Globulin Ratio: 2.3 — ABNORMAL HIGH (ref 1.2–2.2)
Albumin: 4.5 g/dL (ref 3.7–4.7)
Alkaline Phosphatase: 100 IU/L (ref 39–117)
BUN/Creatinine Ratio: 18 (ref 10–24)
BUN: 20 mg/dL (ref 8–27)
Bilirubin Total: 1.5 mg/dL — ABNORMAL HIGH (ref 0.0–1.2)
CO2: 23 mmol/L (ref 20–29)
Calcium: 9.4 mg/dL (ref 8.6–10.2)
Chloride: 100 mmol/L (ref 96–106)
Creatinine, Ser: 1.13 mg/dL (ref 0.76–1.27)
GFR calc Af Amer: 75 mL/min/{1.73_m2} (ref >59)
GFR calc non Af Amer: 65 mL/min/{1.73_m2} (ref >59)
Globulin, Total: 2 g/dL (ref 1.5–4.5)
Glucose: 185 mg/dL — ABNORMAL HIGH (ref 65–99)
Potassium: 4.5 mmol/L (ref 3.5–5.2)
Sodium: 138 mmol/L (ref 134–144)
Total Protein: 6.5 g/dL (ref 6.0–8.5)

## 2020-01-18 LAB — HEMOGLOBIN A1C
Est. average glucose Bld gHb Est-mCnc: 146 mg/dL
Hgb A1c MFr Bld: 6.7 % — ABNORMAL HIGH (ref 4.8–5.6)

## 2020-01-18 LAB — TSH: TSH: 1.87 u[IU]/mL (ref 0.450–4.500)

## 2020-01-21 ENCOUNTER — Other Ambulatory Visit: Payer: Self-pay | Admitting: Family Medicine

## 2020-01-21 NOTE — Telephone Encounter (Signed)
Requested medications are due for refill today?  Please see note below:    Requested medications are on active medication list?  Yes  Last Refill:   01/07/2019  # 90 with no refills.    Future visit scheduled? YES in 3 months.    Notes to Clinic:    JARDIANCE 25 MG TABS tablet [Pharmacy Med Name: JARDIANCE 25 MG TABLET] is not on the preferred formulary for the patient's insurance plan. Below are alternatives which are likely to be more affordable. Do not assume that every medication presented is a clinically appropriate alternative.  These alternatives are medications that are in the same pharmaceutical subclass (Sodium-Glucose Co-Transporter 2 (SGLT2) Inhibitors) as the ordered medication and are on formulary for the patient's insurance plan.

## 2020-01-21 NOTE — Telephone Encounter (Signed)
This medication is not on the preferred list

## 2020-01-22 ENCOUNTER — Telehealth: Payer: Self-pay

## 2020-01-22 NOTE — Telephone Encounter (Signed)
-----   Message from [name]., MD sent at 01/22/2020  7:53 AM EDT ----- Labs stable.  Work on diet and exercise as discussed.

## 2020-01-22 NOTE — Telephone Encounter (Signed)
Patient advised.

## 2020-02-17 ENCOUNTER — Other Ambulatory Visit: Payer: Self-pay | Admitting: Family Medicine

## 2020-02-17 DIAGNOSIS — I2510 Atherosclerotic heart disease of native coronary artery without angina pectoris: Secondary | ICD-10-CM

## 2020-02-17 NOTE — Telephone Encounter (Signed)
Requested medication (s) are due for refill today: no  Requested medication (s) are on the active medication list: no  Last refill:  11/16/2019  Future visit scheduled: yes  Notes to clinic:  not a active medication on patient list Review for use and refill   Requested Prescriptions  Pending Prescriptions Disp Refills   clopidogrel (PLAVIX) 75 MG tablet [Pharmacy Med Name: CLOPIDOGREL 75 MG TABLET] 90 tablet 3    Sig: TAKE 1 TABLET BY [place]      Hematology: Antiplatelets - clopidogrel Failed - 02/17/2020  1:29 AM      Failed - Evaluate AST, ALT within 2 months of therapy initiation.      Failed - Valid encounter within last 6 months    Recent Outpatient Visits           1 month ago Annual physical exam   [facility] [name]., MD   7 months ago Type 2 diabetes mellitus with other circulatory complication, without long-term current use of insulin ([place])   [facility] [name]., MD   10 months ago Type 2 diabetes mellitus with other circulatory complication, without long-term current use of insulin ([place])   [facility] [name]., MD   1 year ago Type 2 diabetes mellitus with other circulatory complication, without long-term current use of insulin [facility])   [facility] [name]., MD   1 year ago Amputation stump infection [facility])   [facility] [name]., MD       Future Appointments             In 3 months [name]., MD [facility]            Passed - ALT in normal range and within 360 days    ALT  Date Value Ref Range Status  01/17/2020 7 0 - 44 IU/L Final          Passed - AST in normal range and within 360 days    AST  Date Value Ref Range Status  01/17/2020 16 0 - 40 IU/L Final          Passed - HCT in normal range and within 180 days    Hematocrit  Date Value Ref Range Status   01/17/2020 42.4 37.5 - 51.0 % Final          Passed - HGB in normal range and within 180 days    Hemoglobin  Date Value Ref Range Status  01/17/2020 14.3 13.0 - 17.7 g/dL Final          Passed - PLT in normal range and within 180 days    Platelets  Date Value Ref Range Status  01/17/2020 169 150 - 450 x10E3/uL Final

## 2020-02-17 NOTE — Telephone Encounter (Signed)
Please advise refill? Looks like someone else fills this for patient.

## 2020-02-25 ENCOUNTER — Other Ambulatory Visit: Payer: Self-pay | Admitting: Family Medicine

## 2020-02-25 DIAGNOSIS — I2510 Atherosclerotic heart disease of native coronary artery without angina pectoris: Secondary | ICD-10-CM

## 2020-02-25 MED ORDER — CLOPIDOGREL BISULFATE 75 MG PO TABS: 75.0000 mg | ORAL_TABLET | Freq: Every day | ORAL | 3 refills | Status: DC

## 2020-02-25 NOTE — Telephone Encounter (Signed)
Medication refill sent to pharmacy  

## 2020-02-25 NOTE — Telephone Encounter (Signed)
[place] faxed refill request for the following medications:  clopidogrel (PLAVIX) 75 MG tablet  90 day supply Last Rx: [facility]/17/[facility] is [facility] pharmacy LOV: 01/15/2020 Please advise. Thanks TNP

## 2020-03-11 DIAGNOSIS — Z89511 Acquired absence of right leg below knee: Secondary | ICD-10-CM | POA: Diagnosis not present

## 2020-03-11 DIAGNOSIS — Z952 Presence of prosthetic heart valve: Secondary | ICD-10-CM | POA: Diagnosis not present

## 2020-03-11 DIAGNOSIS — Z86010 Personal history of colonic polyps: Secondary | ICD-10-CM | POA: Diagnosis not present

## 2020-03-11 DIAGNOSIS — K219 Gastro-esophageal reflux disease without esophagitis: Secondary | ICD-10-CM | POA: Diagnosis not present

## 2020-03-11 DIAGNOSIS — K648 Other hemorrhoids: Secondary | ICD-10-CM | POA: Diagnosis not present

## 2020-03-11 DIAGNOSIS — I739 Peripheral vascular disease, unspecified: Secondary | ICD-10-CM | POA: Diagnosis not present

## 2020-03-11 DIAGNOSIS — Z7902 Long term (current) use of antithrombotics/antiplatelets: Secondary | ICD-10-CM | POA: Diagnosis not present

## 2020-03-11 DIAGNOSIS — Z955 Presence of coronary angioplasty implant and graft: Secondary | ICD-10-CM | POA: Diagnosis not present

## 2020-03-18 DIAGNOSIS — K648 Other hemorrhoids: Secondary | ICD-10-CM | POA: Diagnosis not present

## 2020-03-18 DIAGNOSIS — Z86010 Personal history of colonic polyps: Secondary | ICD-10-CM | POA: Diagnosis not present

## 2020-03-20 ENCOUNTER — Ambulatory Visit: Payer: Self-pay | Admitting: Surgery

## 2020-03-20 NOTE — H&P (View-Only) (Signed)
Subjective:  CC: Internal hemorrhoid, bleeding [K64.8]   HPI:  Andrew Lyons is a 74 y.o. male who was referrred by Gerrit Heck,* for above. Symptoms were first noted a few years ago. he has some rectal bleeding occurring a few times per week. Bleeding is described as light. Pain is rare, confined to the perirectal area, without radiation.  Associated with itching, exacerbated by nothing specific  Toilet habits: Regular, soft BMs   Patient denies a personal history of colon cancer. Patient denies a personal history of IBD. Patient reports to history of polyps.  Last colonoscopy in 2016.  Past Medical History:  has a past medical history of Aortic stenosis with bicuspid valve, Barrett's esophagus with dysplasia (02/18/2015), COPD (chronic obstructive pulmonary disease) (CMS-HCC), Coronary artery disease, H/O adenomatous polyp of colon (02/18/2015), History of hepatitis, HTN (hypertension), Hyperlipidemia, Internal hemorrhoid, bleeding (07/12/2018), Myocardial infarction (CMS-HCC), Seizures (CMS-HCC), and Sleep apnea.  Past Surgical History:  has a past surgical history that includes Cardiac valve replacement; Coronary angioplasty; Amputation Foot; Right thoracotomy; Tonsillectomy; Colonoscopy (03/08/2012, 12/25/2006, 06/01/2004, 01/09/1999); egd (03/08/2012); Colonoscopy (03/18/2015); and egd (03/18/2015).  Family History: family history includes Coronary Artery Disease (Blocked arteries around heart) in his brother, father, and mother; Myocardial Infarction (Heart attack) (age of onset: 41) in his mother; Myocardial Infarction (Heart attack) (age of onset: 76) in his father; Pulmonary embolism in his brother.  Social History:  reports that he has quit smoking. His smoking use included cigarettes. He started smoking about 61 years ago. He has a 110.00 pack-year smoking history. He has never used smokeless tobacco. He reports that he does not drink alcohol. No history on file for drug  use.  Current Medications: has a current medication list which includes the following prescription(s): amlodipine-benazepril, cholecalciferol, clopidogrel, coq-10, cyanocobalamin, empagliflozin, fluticasone propionate, liraglutide, metformin, omeprazole, crestor, sildenafil, and tamsulosin.  Allergies:  Allergies as of 03/18/2020 - Reviewed 03/18/2020  Allergen Reaction Noted  . Bupropion Unknown and Rash 03/17/2015  . Iodinated contrast media Rash   . Gadolinium-containing contrast media Rash 07/12/2018  . Simvastatin Unknown and Other (See Comments) 03/17/2015  . Zyban [bupropion hcl] Unknown     ROS:  A 15 point review of systems was performed and pertinent positives and negatives noted in HPI  Objective:     BP 134/66   Pulse 65   Temp 36.2 C (97.2 F) (Oral)   Ht 177.8 cm (5\' 10" )   Wt (!) 116.6 kg (257 lb)   BMI 36.88 kg/m   Constitutional :  alert, appears stated age, cooperative and no distress  Lymphatics/Throat::  no asymmetry, masses, or scars  Respiratory:  clear to auscultation bilaterally  Cardiovascular:  regular rate and rhythm  Gastrointestinal: soft, non-tender; bowel sounds normal; no masses,  no organomegaly.    Musculoskeletal: Steady gait and movement  Skin: Cool and moist  Psychiatric: Normal affect, non-agitated, not confused  Genital/Rectal: Chaperone present for exam.  External exam noted to have several external hemorrhoids and anal papillae vs genital warts?, with one larger one on right lateral aspect that is non-tender, but with superficial ulceration, no active bleeding.  DRE revealed, normal rectal tone, with palpable internal hemorrhoid vs pedunculated polyp noted at right posterior portion with minimal discomfort.  After obtaining verbal consent, an anoscope was inserted after prepped with lubricant and findingsnoted on DRE confirmed visually, with no evidence of thrombosis. No other pathology such as fissures, fistulas, noted.  Scope withdrawn  and patient tolerate procedure well.  LABS:  n/a   RADS:  Assessment:     Internal hemorrhoid, bleeding [K64.8]  Personal hx of colon polyps Plan:    1. Internal hemorrhoid, bleeding [K64.8] Discussed risks/benefits/alternatives to surgery.  Alternatives include the options of observation, medical management.  Benefits include symptomatic relief.  I discussed  in detail and the complications related to the operation and the anesthesia, including bleeding, infection, recurrence, remote possibility of temporary or permanent fecal incontinence, poor/delayed wound healing, chronic pain, and additional procedures to address said risks. The risks of general anesthetic, if used, includes MI, CVA, sudden death or even reaction to anesthetic medications also discussed.   We also discussed typical post operative recovery which includes weeks to potentially months of anal pain, drainage, occasional bleeding, and sense of fecal urgency.    ED return precautions given for sudden increase in pain, bleeding, with possible accompanying fever, nausea, and/or vomiting.  The patient understands the risks, any and all questions were answered to the patient's satisfaction.  2. Patient has elected to proceed with surgical treatment. Procedure will be scheduled.  Written consent was obtained. Will proceed with [id number], due to hx and bleeding issues, and [id number] at same time to minimize time off plavix.  Will obtain clearance from cardiology.

## 2020-03-20 NOTE — H&P (Signed)
Subjective:  CC: Internal hemorrhoid, bleeding [K64.8]   HPI:  Andrew Lyons is a 74 y.o. male who was referrred by Gerrit Heck,* for above. Symptoms were first noted a few years ago. he has some rectal bleeding occurring a few times per week. Bleeding is described as light. Pain is rare, confined to the perirectal area, without radiation.  Associated with itching, exacerbated by nothing specific  Toilet habits: Regular, soft BMs   Patient denies a personal history of colon cancer. Patient denies a personal history of IBD. Patient reports to history of polyps.  Last colonoscopy in 2016.  Past Medical History:  has a past medical history of Aortic stenosis with bicuspid valve, Barrett's esophagus with dysplasia (02/18/2015), COPD (chronic obstructive pulmonary disease) (CMS-HCC), Coronary artery disease, H/O adenomatous polyp of colon (02/18/2015), History of hepatitis, HTN (hypertension), Hyperlipidemia, Internal hemorrhoid, bleeding (07/12/2018), Myocardial infarction (CMS-HCC), Seizures (CMS-HCC), and Sleep apnea.  Past Surgical History:  has a past surgical history that includes Cardiac valve replacement; Coronary angioplasty; Amputation Foot; Right thoracotomy; Tonsillectomy; Colonoscopy (03/08/2012, 12/25/2006, 06/01/2004, 01/09/1999); egd (03/08/2012); Colonoscopy (03/18/2015); and egd (03/18/2015).  Family History: family history includes Coronary Artery Disease (Blocked arteries around heart) in his brother, father, and mother; Myocardial Infarction (Heart attack) (age of onset: 88) in his mother; Myocardial Infarction (Heart attack) (age of onset: 68) in his father; Pulmonary embolism in his brother.  Social History:  reports that he has quit smoking. His smoking use included cigarettes. He started smoking about 61 years ago. He has a 110.00 pack-year smoking history. He has never used smokeless tobacco. He reports that he does not drink alcohol. No history on file for drug  use.  Current Medications: has a current medication list which includes the following prescription(s): amlodipine-benazepril, cholecalciferol, clopidogrel, coq-10, cyanocobalamin, empagliflozin, fluticasone propionate, liraglutide, metformin, omeprazole, crestor, sildenafil, and tamsulosin.  Allergies:  Allergies as of 03/18/2020 - Reviewed 03/18/2020  Allergen Reaction Noted  . Bupropion Unknown and Rash 03/17/2015  . Iodinated contrast media Rash   . Gadolinium-containing contrast media Rash 07/12/2018  . Simvastatin Unknown and Other (See Comments) 03/17/2015  . Zyban [bupropion hcl] Unknown     ROS:  A 15 point review of systems was performed and pertinent positives and negatives noted in HPI  Objective:     BP 134/66   Pulse 65   Temp 36.2 C (97.2 F) (Oral)   Ht 177.8 cm (5\' 10" )   Wt (!) 116.6 kg (257 lb)   BMI 36.88 kg/m   Constitutional :  alert, appears stated age, cooperative and no distress  Lymphatics/Throat::  no asymmetry, masses, or scars  Respiratory:  clear to auscultation bilaterally  Cardiovascular:  regular rate and rhythm  Gastrointestinal: soft, non-tender; bowel sounds normal; no masses,  no organomegaly.    Musculoskeletal: Steady gait and movement  Skin: Cool and moist  Psychiatric: Normal affect, non-agitated, not confused  Genital/Rectal: Chaperone present for exam.  External exam noted to have several external hemorrhoids and anal papillae vs genital warts?, with one larger one on right lateral aspect that is non-tender, but with superficial ulceration, no active bleeding.  DRE revealed, normal rectal tone, with palpable internal hemorrhoid vs pedunculated polyp noted at right posterior portion with minimal discomfort.  After obtaining verbal consent, an anoscope was inserted after prepped with lubricant and findingsnoted on DRE confirmed visually, with no evidence of thrombosis. No other pathology such as fissures, fistulas, noted.  Scope withdrawn  and patient tolerate procedure well.  LABS:  n/a   RADS:  Assessment:     Internal hemorrhoid, bleeding [K64.8]  Personal hx of colon polyps Plan:    1. Internal hemorrhoid, bleeding [K64.8] Discussed risks/benefits/alternatives to surgery.  Alternatives include the options of observation, medical management.  Benefits include symptomatic relief.  I discussed  in detail and the complications related to the operation and the anesthesia, including bleeding, infection, recurrence, remote possibility of temporary or permanent fecal incontinence, poor/delayed wound healing, chronic pain, and additional procedures to address said risks. The risks of general anesthetic, if used, includes MI, CVA, sudden death or even reaction to anesthetic medications also discussed.   We also discussed typical post operative recovery which includes weeks to potentially months of anal pain, drainage, occasional bleeding, and sense of fecal urgency.    ED return precautions given for sudden increase in pain, bleeding, with possible accompanying fever, nausea, and/or vomiting.  The patient understands the risks, any and all questions were answered to the patient's satisfaction.  2. Patient has elected to proceed with surgical treatment. Procedure will be scheduled.  Written consent was obtained. Will proceed with [id number], due to hx and bleeding issues, and [id number] at same time to minimize time off plavix.  Will obtain clearance from cardiology.

## 2020-03-24 ENCOUNTER — Other Ambulatory Visit: Payer: Self-pay

## 2020-03-24 ENCOUNTER — Other Ambulatory Visit
Admission: RE | Admit: 2020-03-24 | Discharge: 2020-03-24 | Disposition: A | Discharge status: Home / Self Care | Payer: PPO | Source: Ambulatory Visit | Attending: Surgery | Admitting: Surgery

## 2020-03-24 DIAGNOSIS — Z20822 Contact with and (suspected) exposure to covid-19: Secondary | ICD-10-CM | POA: Diagnosis not present

## 2020-03-24 DIAGNOSIS — Z01812 Encounter for preprocedural laboratory examination: Secondary | ICD-10-CM | POA: Diagnosis not present

## 2020-03-24 LAB — SARS CORONAVIRUS 2 (TAT 6-24 HRS): SARS Coronavirus 2: NEGATIVE

## 2020-03-25 ENCOUNTER — Encounter
Admission: RE | Admit: 2020-03-25 | Discharge: 2020-03-25 | Disposition: A | Discharge status: Home / Self Care | Payer: PPO | Source: Ambulatory Visit | Attending: Surgery | Admitting: Surgery

## 2020-03-25 NOTE — Pre-Procedure Instructions (Addendum)
REGARDING CARDIOLOGY CLEARANCE  Per Dr. Ines Bloomer plan:  Plan:    1. Internal hemorrhoid, bleeding [K64.8] Discussed risks/benefits/alternatives to surgery.  Alternatives include the options of observation, medical management.  Benefits include symptomatic relief.  I discussed  in detail and the complications related to the operation and the anesthesia, including bleeding, infection, recurrence, remote possibility of temporary or permanent fecal incontinence, poor/delayed wound healing, chronic pain, and additional procedures to address said risks. The risks of general anesthetic, if used, includes MI, CVA, sudden death or even reaction to anesthetic medications also discussed.   We also discussed typical post operative recovery which includes weeks to potentially months of anal pain, drainage, occasional bleeding, and sense of fecal urgency.    ED return precautions given for sudden increase in pain, bleeding, with possible accompanying fever, nausea, and/or vomiting.  The patient understands the risks, any and all questions were answered to the patient's satisfaction.  2. Patient has elected to proceed with surgical treatment. Procedure will be scheduled.  Written consent was obtained. Will proceed with [id number], due to hx and bleeding issues, and hemorrhoidectomy46260 at same time to minimize time off plavix.  Will obtain clearance from cardiology.        I called Judeen Hammans at Dr. Ines Bloomer office to inquire about status of clearance. She said they have received cardiology clearance and sent it to the Colonoscopy suite. She is going to track it down and fax it to [facility].  Will update with new information as it arrives.

## 2020-03-25 NOTE — Patient Instructions (Signed)
Your procedure is scheduled on: Thursday 03/26/20  Report to [place]. To find out your arrival time please call [phone] between 1PM - 3PM on Wednesday 03/25/20.   Remember: Instructions that are not followed completely may result in serious medical risk, up to and including death, or upon the discretion of your surgeon and anesthesiologist your surgery may need to be rescheduled.     __X__ 1. Follow Dr. Ines Bloomer bowel prep. You may have water to drink until 2 hours prior to your scheduled arrival time tomorrow.    __X__2.  On the morning of surgery brush your teeth with toothpaste and water, you may rinse your mouth with mouthwash if you wish.  Do not swallow any toothpaste or mouthwash.     __X__ 3.  No Alcohol for 24 hours before or after surgery.   __X__ 4.  Do Not Smoke or use e-cigarettes For 24 Hours Prior to Your Surgery.                 Do not use any chewable tobacco products for at least 6 hours prior to                 surgery.  __X__5.  Notify your doctor if there is any change in your medical condition      (cold, fever, infections).       Do not wear jewelry, make-up, hairpins, clips or nail polish. Do not wear lotions, powders, or perfumes.  Do not shave 48 hours prior to surgery. Men may shave face and neck. Do not bring valuables to the hospital.     [facility] is not responsible for any belongings or valuables.   Contacts, dentures/partials or body piercings may not be worn into surgery. Bring a case for your contacts, glasses or hearing aids, a denture cup will be supplied.    Patients discharged the day of surgery will not be allowed to drive home.    __X__ Take these medicines the morning of surgery with A SIP OF WATER:     1. fluticasone (FLONASE)  2. metoprolol succinate (TOPROL-XL)  3. montelukast (SINGULAIR)   4. omeprazole (PRILOSEC)  5. rosuvastatin (CRESTOR)      __X__ Stop  Metformin 2 days prior to surgery.    __X__ Take 1/2 of usual insulin dose the night before surgery. No insulin the morning          of surgery.    __X__ Stop Blood Thinners: Plavix & Aspirin. You reported stopping these medications on 03/20/20.   __X__ Stop Anti-inflammatories 7 days before surgery such as Advil, Ibuprofen, Motrin, BC or Goodies Powder, Naprosyn, Naproxen, Aleve, Aspirin, Meloxicam. May take Tylenol if needed for pain or discomfort.

## 2020-03-26 ENCOUNTER — Ambulatory Visit
Admission: RE | Admit: 2020-03-26 | Discharge: 2020-03-26 | Disposition: A | Discharge status: Home / Self Care | Payer: PPO | Attending: Surgery | Admitting: Surgery

## 2020-03-26 ENCOUNTER — Encounter
Admission: RE | Disposition: A | Discharge status: Home / Self Care | Payer: Self-pay | Source: Home / Self Care | Attending: Surgery

## 2020-03-26 ENCOUNTER — Other Ambulatory Visit: Payer: Self-pay

## 2020-03-26 ENCOUNTER — Encounter: Payer: Self-pay | Admitting: Surgery

## 2020-03-26 ENCOUNTER — Ambulatory Visit: Payer: PPO | Admitting: Anesthesiology

## 2020-03-26 DIAGNOSIS — K648 Other hemorrhoids: Secondary | ICD-10-CM | POA: Insufficient documentation

## 2020-03-26 DIAGNOSIS — I10 Essential (primary) hypertension: Secondary | ICD-10-CM | POA: Diagnosis not present

## 2020-03-26 DIAGNOSIS — E1151 Type 2 diabetes mellitus with diabetic peripheral angiopathy without gangrene: Secondary | ICD-10-CM | POA: Diagnosis not present

## 2020-03-26 DIAGNOSIS — Z79899 Other long term (current) drug therapy: Secondary | ICD-10-CM | POA: Insufficient documentation

## 2020-03-26 DIAGNOSIS — Z794 Long term (current) use of insulin: Secondary | ICD-10-CM | POA: Insufficient documentation

## 2020-03-26 DIAGNOSIS — K219 Gastro-esophageal reflux disease without esophagitis: Secondary | ICD-10-CM | POA: Insufficient documentation

## 2020-03-26 DIAGNOSIS — Z86010 Personal history of colonic polyps: Secondary | ICD-10-CM | POA: Diagnosis not present

## 2020-03-26 DIAGNOSIS — Z952 Presence of prosthetic heart valve: Secondary | ICD-10-CM | POA: Insufficient documentation

## 2020-03-26 DIAGNOSIS — K642 Third degree hemorrhoids: Secondary | ICD-10-CM | POA: Diagnosis not present

## 2020-03-26 DIAGNOSIS — I2510 Atherosclerotic heart disease of native coronary artery without angina pectoris: Secondary | ICD-10-CM | POA: Diagnosis not present

## 2020-03-26 DIAGNOSIS — K621 Rectal polyp: Secondary | ICD-10-CM | POA: Diagnosis not present

## 2020-03-26 DIAGNOSIS — K5790 Diverticulosis of intestine, part unspecified, without perforation or abscess without bleeding: Secondary | ICD-10-CM | POA: Diagnosis not present

## 2020-03-26 DIAGNOSIS — G4730 Sleep apnea, unspecified: Secondary | ICD-10-CM | POA: Insufficient documentation

## 2020-03-26 DIAGNOSIS — K759 Inflammatory liver disease, unspecified: Secondary | ICD-10-CM | POA: Diagnosis not present

## 2020-03-26 DIAGNOSIS — K640 First degree hemorrhoids: Secondary | ICD-10-CM | POA: Diagnosis not present

## 2020-03-26 DIAGNOSIS — J449 Chronic obstructive pulmonary disease, unspecified: Secondary | ICD-10-CM | POA: Insufficient documentation

## 2020-03-26 DIAGNOSIS — E785 Hyperlipidemia, unspecified: Secondary | ICD-10-CM | POA: Insufficient documentation

## 2020-03-26 DIAGNOSIS — E119 Type 2 diabetes mellitus without complications: Secondary | ICD-10-CM | POA: Insufficient documentation

## 2020-03-26 DIAGNOSIS — Z8249 Family history of ischemic heart disease and other diseases of the circulatory system: Secondary | ICD-10-CM | POA: Diagnosis not present

## 2020-03-26 DIAGNOSIS — I350 Nonrheumatic aortic (valve) stenosis: Secondary | ICD-10-CM | POA: Diagnosis not present

## 2020-03-26 DIAGNOSIS — K644 Residual hemorrhoidal skin tags: Secondary | ICD-10-CM | POA: Diagnosis not present

## 2020-03-26 DIAGNOSIS — D125 Benign neoplasm of sigmoid colon: Secondary | ICD-10-CM | POA: Insufficient documentation

## 2020-03-26 DIAGNOSIS — G4733 Obstructive sleep apnea (adult) (pediatric): Secondary | ICD-10-CM | POA: Diagnosis not present

## 2020-03-26 DIAGNOSIS — I252 Old myocardial infarction: Secondary | ICD-10-CM | POA: Insufficient documentation

## 2020-03-26 DIAGNOSIS — Z7902 Long term (current) use of antithrombotics/antiplatelets: Secondary | ICD-10-CM | POA: Diagnosis not present

## 2020-03-26 DIAGNOSIS — Z87891 Personal history of nicotine dependence: Secondary | ICD-10-CM | POA: Diagnosis not present

## 2020-03-26 DIAGNOSIS — Z1211 Encounter for screening for malignant neoplasm of colon: Secondary | ICD-10-CM | POA: Diagnosis not present

## 2020-03-26 DIAGNOSIS — K635 Polyp of colon: Secondary | ICD-10-CM | POA: Diagnosis not present

## 2020-03-26 HISTORY — PX: HEMORRHOID SURGERY: SHX153

## 2020-03-26 HISTORY — PX: COLONOSCOPY: SHX5424

## 2020-03-26 LAB — GLUCOSE, CAPILLARY
Glucose-Capillary: 143 mg/dL — ABNORMAL HIGH (ref 70–99)
Glucose-Capillary: 166 mg/dL — ABNORMAL HIGH (ref 70–99)

## 2020-03-26 SURGERY — HEMORRHOIDECTOMY
Anesthesia: General | Site: Rectum

## 2020-03-26 SURGERY — COLONOSCOPY WITH PROPOFOL
Anesthesia: General

## 2020-03-26 MED ORDER — SUGAMMADEX SODIUM 500 MG/5ML IV SOLN
INTRAVENOUS | Status: AC
  Filled 2020-03-26: qty 5

## 2020-03-26 MED ORDER — SUCCINYLCHOLINE CHLORIDE 20 MG/ML IJ SOLN
INTRAMUSCULAR | Status: DC | PRN
  Administered 2020-03-26: 140 mg via INTRAVENOUS

## 2020-03-26 MED ORDER — ONDANSETRON HCL 4 MG/2ML IJ SOLN
INTRAMUSCULAR | Status: AC
  Filled 2020-03-26: qty 2

## 2020-03-26 MED ORDER — DEXAMETHASONE SODIUM PHOSPHATE 10 MG/ML IJ SOLN
INTRAMUSCULAR | Status: DC | PRN
  Administered 2020-03-26: 5 mg via INTRAVENOUS

## 2020-03-26 MED ORDER — ORAL CARE MOUTH RINSE: 15.0000 mL | Freq: Once | OROMUCOSAL | Status: DC

## 2020-03-26 MED ORDER — OXYCODONE HCL 5 MG PO TABS: 5.0000 mg | ORAL_TABLET | Freq: Once | ORAL | Status: DC | PRN

## 2020-03-26 MED ORDER — BUPIVACAINE LIPOSOME 1.3 % IJ SUSP
INTRAMUSCULAR | Status: AC
  Filled 2020-03-26: qty 20

## 2020-03-26 MED ORDER — VECURONIUM BROMIDE 10 MG IV SOLR
INTRAVENOUS | Status: AC
  Filled 2020-03-26: qty 10

## 2020-03-26 MED ORDER — DEXAMETHASONE SODIUM PHOSPHATE 10 MG/ML IJ SOLN
INTRAMUSCULAR | Status: AC
  Filled 2020-03-26: qty 1

## 2020-03-26 MED ORDER — IBUPROFEN 800 MG PO TABS
800.0000 mg | ORAL_TABLET | Freq: Three times a day (TID) | ORAL | 0 refills | Status: DC | PRN
Start: 2020-03-26 — End: 2022-12-26

## 2020-03-26 MED ORDER — ONDANSETRON HCL 4 MG/2ML IJ SOLN
INTRAMUSCULAR | Status: DC | PRN
  Administered 2020-03-26: 4 mg via INTRAVENOUS

## 2020-03-26 MED ORDER — ACETAMINOPHEN 325 MG PO TABS: 650.0000 mg | ORAL_TABLET | Freq: Three times a day (TID) | ORAL | 0 refills | Status: AC | PRN

## 2020-03-26 MED ORDER — FENTANYL CITRATE (PF) 100 MCG/2ML IJ SOLN: 25.0000 ug | INTRAMUSCULAR | Status: DC | PRN

## 2020-03-26 MED ORDER — OXYCODONE HCL 5 MG/5ML PO SOLN: 5.0000 mg | Freq: Once | ORAL | Status: DC | PRN

## 2020-03-26 MED ORDER — GABAPENTIN 300 MG PO CAPS: 300.0000 mg | ORAL_CAPSULE | ORAL | Status: AC

## 2020-03-26 MED ORDER — MIDAZOLAM HCL 2 MG/2ML IJ SOLN
INTRAMUSCULAR | Status: DC | PRN
  Administered 2020-03-26 (×2): 1 mg via INTRAVENOUS

## 2020-03-26 MED ORDER — METOPROLOL SUCCINATE ER 100 MG PO TB24
100.0000 mg | ORAL_TABLET | Freq: Every day | ORAL | Status: DC
  Administered 2020-03-26: 100 mg via ORAL
  Filled 2020-03-26 (×3): qty 1

## 2020-03-26 MED ORDER — FENTANYL CITRATE (PF) 250 MCG/5ML IJ SOLN
INTRAMUSCULAR | Status: AC
  Filled 2020-03-26: qty 5

## 2020-03-26 MED ORDER — SODIUM CHLORIDE 0.9 % IV SOLN: INTRAVENOUS | Status: DC

## 2020-03-26 MED ORDER — MIDAZOLAM HCL 2 MG/2ML IJ SOLN
INTRAMUSCULAR | Status: AC
  Filled 2020-03-26: qty 2

## 2020-03-26 MED ORDER — CHLORHEXIDINE GLUCONATE 0.12 % MT SOLN: 15.0000 mL | Freq: Once | OROMUCOSAL | Status: DC

## 2020-03-26 MED ORDER — LACTATED RINGERS IV SOLN: INTRAVENOUS | Status: DC | PRN

## 2020-03-26 MED ORDER — FAMOTIDINE 20 MG PO TABS
ORAL_TABLET | ORAL | Status: AC
  Administered 2020-03-26: 20 mg
  Filled 2020-03-26: qty 1

## 2020-03-26 MED ORDER — LIDOCAINE HCL (CARDIAC) PF 100 MG/5ML IV SOSY
PREFILLED_SYRINGE | INTRAVENOUS | Status: DC | PRN
  Administered 2020-03-26: 100 mg via INTRAVENOUS

## 2020-03-26 MED ORDER — HYDROCODONE-ACETAMINOPHEN 5-325 MG PO TABS: 1.0000 | ORAL_TABLET | Freq: Four times a day (QID) | ORAL | 0 refills | Status: AC | PRN

## 2020-03-26 MED ORDER — CEFAZOLIN SODIUM-DEXTROSE 2-4 GM/100ML-% IV SOLN
INTRAVENOUS | Status: AC
  Filled 2020-03-26: qty 100

## 2020-03-26 MED ORDER — PHENYLEPHRINE HCL (PRESSORS) 10 MG/ML IV SOLN
INTRAVENOUS | Status: DC | PRN
  Administered 2020-03-26: 100 ug via INTRAVENOUS

## 2020-03-26 MED ORDER — BUPIVACAINE LIPOSOME 1.3 % IJ SUSP
INTRAMUSCULAR | Status: DC | PRN
  Administered 2020-03-26: 20 mL

## 2020-03-26 MED ORDER — GELATIN ABSORBABLE 100 CM EX MISC
CUTANEOUS | Status: AC
  Filled 2020-03-26: qty 1

## 2020-03-26 MED ORDER — VECURONIUM BROMIDE 10 MG IV SOLR
INTRAVENOUS | Status: DC | PRN
  Administered 2020-03-26: 2 mg via INTRAVENOUS
  Administered 2020-03-26 (×3): 1 mg via INTRAVENOUS

## 2020-03-26 MED ORDER — GABAPENTIN 300 MG PO CAPS
ORAL_CAPSULE | ORAL | Status: AC
  Administered 2020-03-26: 300 mg via ORAL
  Filled 2020-03-26: qty 1

## 2020-03-26 MED ORDER — PHENYLEPHRINE HCL (PRESSORS) 10 MG/ML IV SOLN
INTRAVENOUS | Status: AC
  Filled 2020-03-26: qty 1

## 2020-03-26 MED ORDER — CELECOXIB 200 MG PO CAPS: 200.0000 mg | ORAL_CAPSULE | ORAL | Status: AC

## 2020-03-26 MED ORDER — CELECOXIB 200 MG PO CAPS
ORAL_CAPSULE | ORAL | Status: AC
  Administered 2020-03-26: 200 mg via ORAL
  Filled 2020-03-26: qty 1

## 2020-03-26 MED ORDER — ACETAMINOPHEN 500 MG PO TABS
ORAL_TABLET | ORAL | Status: AC
  Administered 2020-03-26: 1000 mg via ORAL
  Filled 2020-03-26: qty 2

## 2020-03-26 MED ORDER — VICTOZA 18 MG/3ML ~~LOC~~ SOPN: 1.8000 mg | PEN_INJECTOR | Freq: Every day | SUBCUTANEOUS | Status: DC

## 2020-03-26 MED ORDER — SUGAMMADEX SODIUM 500 MG/5ML IV SOLN
INTRAVENOUS | Status: DC | PRN
  Administered 2020-03-26: 100 mg via INTRAVENOUS
  Administered 2020-03-26: 250 mg via INTRAVENOUS

## 2020-03-26 MED ORDER — SUCCINYLCHOLINE CHLORIDE 200 MG/10ML IV SOSY
PREFILLED_SYRINGE | INTRAVENOUS | Status: AC
  Filled 2020-03-26: qty 10

## 2020-03-26 MED ORDER — GELATIN ABSORBABLE 12-7 MM EX MISC
CUTANEOUS | Status: DC | PRN
  Administered 2020-03-26: 1

## 2020-03-26 MED ORDER — FENTANYL CITRATE (PF) 100 MCG/2ML IJ SOLN
INTRAMUSCULAR | Status: DC | PRN
  Administered 2020-03-26 (×2): 50 ug via INTRAVENOUS

## 2020-03-26 MED ORDER — ACETAMINOPHEN 500 MG PO TABS: 1000.0000 mg | ORAL_TABLET | ORAL | Status: AC

## 2020-03-26 MED ORDER — BUPIVACAINE-EPINEPHRINE (PF) 0.5% -1:200000 IJ SOLN
INTRAMUSCULAR | Status: DC | PRN
  Administered 2020-03-26: 20 mL

## 2020-03-26 MED ORDER — LIDOCAINE HCL (PF) 2 % IJ SOLN
INTRAMUSCULAR | Status: AC
  Filled 2020-03-26: qty 5

## 2020-03-26 MED ORDER — PROPOFOL 10 MG/ML IV BOLUS
INTRAVENOUS | Status: DC | PRN
  Administered 2020-03-26: 150 mg via INTRAVENOUS

## 2020-03-26 MED ORDER — DOCUSATE SODIUM 100 MG PO CAPS
100.0000 mg | ORAL_CAPSULE | Freq: Two times a day (BID) | ORAL | 0 refills | Status: AC | PRN
Start: 2020-03-26 — End: 2020-04-05

## 2020-03-26 MED ORDER — BUPIVACAINE-EPINEPHRINE (PF) 0.5% -1:200000 IJ SOLN
INTRAMUSCULAR | Status: AC
  Filled 2020-03-26: qty 30

## 2020-03-26 MED ORDER — CEFAZOLIN SODIUM-DEXTROSE 2-4 GM/100ML-% IV SOLN
2.0000 g | INTRAVENOUS | Status: AC
  Administered 2020-03-26: 2 g via INTRAVENOUS

## 2020-03-26 MED ORDER — CHLORHEXIDINE GLUCONATE CLOTH 2 % EX PADS: 6.0000 | MEDICATED_PAD | Freq: Once | CUTANEOUS | Status: DC

## 2020-03-26 MED ORDER — PROPOFOL 10 MG/ML IV BOLUS
INTRAVENOUS | Status: AC
  Filled 2020-03-26: qty 20

## 2020-03-26 SURGICAL SUPPLY — 31 items
BLADE SURG 15 STRL LF DISP TIS (BLADE) ×2 IMPLANT
BLADE SURG 15 STRL SS (BLADE) ×2
BRIEF STRETCH MATERNITY 2XLG (MISCELLANEOUS) ×4 IMPLANT
CANISTER SUCT 1200ML W/VALVE (MISCELLANEOUS) ×4 IMPLANT
COVER WAND RF STERILE (DRAPES) ×4 IMPLANT
DRAPE PERI LITHO V/GYN (MISCELLANEOUS) ×4 IMPLANT
DRAPE UNDER BUTTOCK W/FLU (DRAPES) ×4 IMPLANT
DRSG GAUZE FLUFF 36X18 (GAUZE/BANDAGES/DRESSINGS) IMPLANT
ELECT REM PT RETURN 9FT ADLT (ELECTROSURGICAL) ×4
ELECTRODE REM PT RTRN 9FT ADLT (ELECTROSURGICAL) ×2 IMPLANT
GLOVE BIOGEL PI IND STRL 7.0 (GLOVE) ×6 IMPLANT
GLOVE BIOGEL PI INDICATOR 7.0 (GLOVE) ×6
GLOVE SURG SYN 6.5 ES PF (GLOVE) ×12 IMPLANT
GOWN STRL REUS W/ TWL LRG LVL3 (GOWN DISPOSABLE) ×6 IMPLANT
GOWN STRL REUS W/TWL LRG LVL3 (GOWN DISPOSABLE) ×6
KIT TURNOVER CYSTO (KITS) ×4 IMPLANT
LABEL OR SOLS (LABEL) ×4 IMPLANT
NEEDLE HYPO 22GX1.5 SAFETY (NEEDLE) ×4 IMPLANT
NEEDLE HYPO 25X1 1.5 SAFETY (NEEDLE) IMPLANT
NS IRRIG 500ML POUR BTL (IV SOLUTION) ×4 IMPLANT
PACK BASIN MINOR (MISCELLANEOUS) ×4 IMPLANT
PAD PREP 24X41 OB/GYN DISP (PERSONAL CARE ITEMS) ×4 IMPLANT
SHEARS FOC LG CVD HARMONIC 17C (MISCELLANEOUS) ×4 IMPLANT
SOL PREP PVP 2OZ (MISCELLANEOUS)
SOLUTION PREP PVP 2OZ (MISCELLANEOUS) IMPLANT
SURGILUBE 2OZ TUBE FLIPTOP (MISCELLANEOUS) ×4 IMPLANT
SUT VIC AB 3-0 SH 27 (SUTURE) ×6
SUT VIC AB 3-0 SH 27X BRD (SUTURE) ×6 IMPLANT
SYR 10ML LL (SYRINGE) ×4 IMPLANT
SYR 20ML LL LF (SYRINGE) ×4 IMPLANT
SYR BULB IRRIG 60ML STRL (SYRINGE) ×4 IMPLANT

## 2020-03-26 NOTE — Anesthesia Preprocedure Evaluation (Signed)
Anesthesia Evaluation  Patient identified by MRN, date of birth, ID band Patient awake    Reviewed: Allergy & Precautions, H&P , NPO status , Patient's Chart, lab work & pertinent test results  History of Anesthesia Complications Negative for: history of anesthetic complications  Airway Mallampati: III  TM Distance: <3 FB Neck ROM: limited    Dental  (+) Edentulous Lower, Edentulous Upper   Pulmonary sleep apnea , COPD, former smoker,    Pulmonary exam normal        Cardiovascular Exercise Tolerance: Good hypertension, (-) angina+ CAD, + Past MI and + Peripheral Vascular Disease  Normal cardiovascular exam+ Valvular Problems/Murmurs      Neuro/Psych Seizures -,  negative psych ROS   GI/Hepatic negative GI ROS, GERD  ,(+) Hepatitis -  Endo/Other  diabetes, Type 2, Insulin Dependent  Renal/GU      Musculoskeletal   Abdominal   Peds  Hematology negative hematology ROS (+)   Anesthesia Other Findings Past Medical History: No date: 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 No date: Barrett's esophagus with dysplasia No date: Cataract No date: COPD (chronic obstructive pulmonary disease) (HCC) No date: Coronary artery disease No date: Diabetes mellitus without complication (HCC) No date: GERD (gastroesophageal reflux disease) No date: Hepatitis 2007: Hx of dysplastic nevus     Comment:  multiple sites  No date: Hyperlipidemia No date: Hypertension No date: Myocardial infarction ([place]) No date: Seizures ([place]) No date: Sleep apnea  Past Surgical History: 06/20/2018: ABDOMINAL AORTOGRAM W/LOWER EXTREMITY; N/A     Comment:  Procedure: ABDOMINAL AORTOGRAM W/LOWER EXTREMITY;                Surgeon: Wellington Hampshire, MD;  Location: [place] [facility];  Service: Cardiovascular;  Laterality: N/A; 10/27/2018: AMPUTATION; Right     Comment:  Procedure: AMPUTATION BELOW KNEE;  Surgeon: Leim Fabry, MD;  Location: [facility] ORS;  Service: Orthopedics;                Laterality: Right; No date: CARDIAC CATHETERIZATION     Comment:  with Angioplasty No date: cardiac stents No date: CARDIAC VALVE REPLACEMENT     Comment:  Aortic Valve Replacement 03/18/2015: COLONOSCOPY WITH PROPOFOL; N/A     Comment:  Procedure: COLONOSCOPY WITH PROPOFOL;  Surgeon: [name], MD;  Location: [facility] ENDOSCOPY;  Service:               Endoscopy;  Laterality: N/A; 03/18/2015: ESOPHAGOGASTRODUODENOSCOPY; N/A     Comment:  Procedure: ESOPHAGOGASTRODUODENOSCOPY (EGD);  Surgeon:               [name], MD;  Location: [facility] ENDOSCOPY;                Service: Endoscopy;  Laterality: N/A; No date: FOOT AMPUTATION 10/16/2018: GREEN LIGHT LASER TURP (TRANSURETHRAL RESECTION OF  PROSTATE; N/A     Comment:  Procedure: GREEN LIGHT LASER TURP (TRANSURETHRAL               RESECTION OF PROSTATE;  Surgeon: Royston Cowper, MD;                Location: [facility] ORS;  Service: Urology;  Laterality: N/A; 10/27/2018: HARDWARE REMOVAL; Right     Comment:  Procedure: HARDWARE REMOVAL;  Surgeon: Leim Fabry, MD;              Location: [facility] ORS;  Service: Orthopedics;  Laterality:               Right; 10/25/2018: LOWER EXTREMITY ANGIOGRAPHY; Right     Comment:  Procedure: Lower Extremity Angiography;  Surgeon: Algernon Huxley, MD;  Location: [place] [facility];  Service:               Cardiovascular;  Laterality: Right; 06/20/2018: PERIPHERAL VASCULAR INTERVENTION; Right     Comment:  Procedure: PERIPHERAL VASCULAR INTERVENTION;  Surgeon:               Wellington Hampshire, MD;  Location: [place] [facility];                Service: Cardiovascular;  Laterality: Right; No date: THORACOTOMY; Right No date: TONSILLECTOMY  BMI    Body Mass Index: 36.88 kg/m      Reproductive/Obstetrics negative OB ROS                             Anesthesia Physical Anesthesia  Plan  ASA: III  Anesthesia Plan: General ETT   Post-op Pain Management:    Induction: Intravenous  PONV Risk Score and Plan: Ondansetron, Dexamethasone, Midazolam and Treatment may vary due to age or medical condition  Airway Management Planned: Oral ETT  Additional Equipment:   Intra-op Plan:   Post-operative Plan: Extubation in OR  Informed Consent: I have reviewed the patients History and Physical, chart, labs and discussed the procedure including the risks, benefits and alternatives for the proposed anesthesia with the patient or authorized representative who has indicated his/her understanding and acceptance.     Dental Advisory Given  Plan Discussed with: Anesthesiologist, CRNA and Surgeon  Anesthesia Plan Comments: (Patient consented for risks of anesthesia including but not limited to:  - adverse reactions to medications - damage to eyes, teeth, lips or other oral mucosa - nerve damage due to positioning  - sore throat or hoarseness - Damage to heart, brain, nerves, lungs, other parts of body or loss of life  Patient voiced understanding.)        Anesthesia Quick Evaluation

## 2020-03-26 NOTE — Transfer of Care (Signed)
Immediate Anesthesia Transfer of Care Note  Patient: Andrew Lyons  Procedure(s) Performed: HEMORRHOIDECTOMY (N/A Rectum) COLONOSCOPY (N/A )  Patient Location: PACU  Anesthesia Type:General  Level of Consciousness: awake, alert  and drowsy  Airway & Oxygen Therapy: Patient Spontanous Breathing and Patient connected to face mask oxygen  Post-op Assessment: Report given to RN and Post -op Vital signs reviewed and stable  Post vital signs: Reviewed and stable  Last Vitals:  Vitals Value Taken Time  BP 141/67 03/26/20 1248  Temp 36.4 C 03/26/20 1248  Pulse 74 03/26/20 1251  Resp 14 03/26/20 1251  SpO2 98 % 03/26/20 1251  Vitals shown include unvalidated device data.  Last Pain:  Vitals:   03/26/20 0905  TempSrc: Tympanic         Complications: No complications documented.

## 2020-03-26 NOTE — Interval H&P Note (Signed)
History and Physical Interval Note:  03/26/2020 9:42 AM  Andrew Lyons  has presented today for surgery, with the diagnosis of Z86.010-H/O adenomatous polyp of colon K64.8- Internal hermorrhoids.  The various methods of treatment have been discussed with the patient and family. After consideration of risks, benefits and other options for treatment, the patient has consented to colonoscopy and  Procedure(s): HEMORRHOIDECTOMY (N/A) COLONOSCOPY (N/A) as a surgical intervention.  The patient's history has been reviewed, patient examined, no change in status, stable for surgery.  I have reviewed the patient's chart and labs.  Questions were answered to the patient's satisfaction.     Hervey Wedig Lysle Pearl

## 2020-03-26 NOTE — Progress Notes (Signed)
Colonoscopy performed OR room 8 with Dr. Marliss Czar, ENDO Tech Candis Shine, RN  Colon Scope in:  11:02am Cecum:  11:14am Colon Scope out:  11:24am  cbx polyp sigmoid cbx polyp rectum

## 2020-03-26 NOTE — Interval H&P Note (Signed)
History and Physical Interval Note:  03/26/2020 9:36 AM  Andrew Lyons  has presented today for surgery, with the diagnosis of Z86.010-H/O adenomatous polyp of colon K64.8- Internal hermorrhoids.  The various methods of treatment have been discussed with the patient and family. After consideration of risks, benefits and other options for treatment, the patient has consented to  Procedure(s): HEMORRHOIDECTOMY (N/A) COLONOSCOPY (N/A) as a surgical intervention.  The patient's history has been reviewed, patient examined, no change in status, stable for surgery.  I have reviewed the patient's chart and labs.  Questions were answered to the patient's satisfaction.     Sujata Maines Lysle Pearl

## 2020-03-26 NOTE — Anesthesia Postprocedure Evaluation (Signed)
Anesthesia Post Note  Patient: Andrew Lyons  Procedure(s) Performed: HEMORRHOIDECTOMY (N/A Rectum) COLONOSCOPY (N/A )  Patient location during evaluation: PACU Anesthesia Type: General Level of consciousness: awake and alert Pain management: pain level controlled Vital Signs Assessment: post-procedure vital signs reviewed and stable Respiratory status: spontaneous breathing, nonlabored ventilation, respiratory function stable and patient connected to nasal cannula oxygen Cardiovascular status: blood pressure returned to baseline and stable Postop Assessment: no apparent nausea or vomiting Anesthetic complications: no   No complications documented.   Last Vitals:  Vitals:   03/26/20 1332 03/26/20 1401  BP: (!) 144/80 (!) 145/70  Pulse: 69 78  Resp: 17 18  Temp: 36.7 C (!) 36 C  SpO2: 96% 97%    Last Pain:  Vitals:   03/26/20 1401  TempSrc: Temporal  PainSc:                  [name] [name]

## 2020-03-26 NOTE — Anesthesia Procedure Notes (Addendum)
Procedure Name: Intubation Date/Time: 03/26/2020 10:55 AM Performed by: Nolon Lennert, RN Pre-anesthesia Checklist: Patient identified, Patient being monitored, Timeout performed, Emergency Drugs available and Suction available Patient Re-evaluated:Patient Re-evaluated prior to induction Oxygen Delivery Method: Circle system utilized Preoxygenation: Pre-oxygenation with 100% oxygen Induction Type: IV induction Ventilation: Oral airway inserted - appropriate to patient size, Two handed mask ventilation required and Mask ventilation without difficulty Laryngoscope Size: 3 and Miller Grade View: Grade I Tube type: Oral Tube size: 7.5 mm Number of attempts: 1 Airway Equipment and Method: Stylet and Oral airway Placement Confirmation: ETT inserted through vocal cords under direct vision,  positive ETCO2 and breath sounds checked- equal and bilateral Secured at: 23 (lip) cm Tube secured with: Tape Dental Injury: Teeth and Oropharynx as per pre-operative assessment

## 2020-03-26 NOTE — Discharge Instructions (Signed)
hemorrhoidectomy, Care After This sheet gives you information about how to care for yourself after your procedure. Your health care provider may also give you more specific instructions. If you have problems or questions, contact your health care provider. What can I expect after the procedure? After the procedure, it is common to have:  Soreness.  Bruising.  Itching. Follow these instructions at home: site care Follow instructions from your health care provider about how to take care of your site. Make sure you:  PACKING IS WITHIN RECTUM.  THIS WILL COME OUT WHEN READY.  NO NEED TO REPACK  If the area bleeds or bruises, apply gentle pressure for 10 minutes.  OK TO SHOWER IN 24HRS  Check your site every day for signs of infection. Check for:  Redness, swelling, or pain.  Fluid or blood.  Warmth.  Pus or a bad smell.  General instructions  Rest and then return to your normal activities as told by your health care provider.  RESUME PLAVIX 48HRS AFTER SURGERY .  tylenol and advil as needed for discomfort.  Please alternate between the two every four hours as needed for pain.   .  Use narcotics, if prescribed, only when tylenol and motrin is not enough to control pain. .  325-650mg  every 8hrs to max of 3000mg /24hrs (including the 325mg  in every norco dose) for the tylenol.   .  Advil up to 800mg  per dose every 8hrs as needed for pain.    Keep all follow-up visits as told by your health care provider. This is important. Contact a health care provider if:  You have redness, swelling, or pain around your site.  You have fluid or blood coming from your site.  Your site feels warm to the touch.  You have pus or a bad smell coming from your site.  You have a fever.  Your sutures, skin glue, or adhesive strips loosen or come off sooner than expected. Get help right away if:  You have bleeding that does not stop with pressure or a dressing. Summary  After the procedure,  it is common to have some soreness, bruising, and itching at the site.  Follow instructions from your health care provider about how to take care of your site.  Check your site every day for signs of infection.  Contact a health care provider if you have redness, swelling, or pain around your site, or your site feels warm to the touch.  Keep all follow-up visits as told by your health care provider. This is important. This information is not intended to replace advice given to you by your health care provider. Make sure you discuss any questions you have with your health care provider. Document Released: 10/09/2015 Document Revised: 03/12/2018 Document Reviewed: 03/12/2018 Elsevier Interactive Patient Education  [employer].

## 2020-03-27 ENCOUNTER — Encounter: Payer: Self-pay | Admitting: Surgery

## 2020-03-27 LAB — SURGICAL PATHOLOGY

## 2020-03-27 NOTE — Op Note (Signed)
Preoperative diagnosis: Third-degree degree hemorrhoids.  And anal skin tag  Postoperative diagnosis: Same Procedure: exam under anesthesia, 3 column hemorrhoidectomy and skin tag excision  Surgeon: Lysle Pearl  Anesthesia: general  Specimen: hemorrhoids  Complications: none  EBL: 21mL  Wound classification: Clean Contaminated  Indications: Patient is a 74 y.o. male was found to have symptomatic hemorrhoids refractory to medical management.   Findings: 1. third  degree hemorrhoids 2. Internal and external anal sphincter palpated and preserved 3. Adequate hemostasis  Description of procedure: The patient was brought to the operating room and general anesthesia was induced. Patient was placed high lithotomy position. A time-out was completed verifying correct patient, procedure, site, positioning, and implant(s) and/or special equipment prior to beginning this procedure. The perineum was prepped and draped in standard sterile fashion. Local anesthetic was injected as a perianal block. An anoscope was introduced and hemorrhoidal pedicles identified.  A harmonic was placed across the base of the left anterior pedicle and excess hemorrhoidal tissue removed.  Specimen was passed off operative field pending pathology.  3-0 Vicryl then used to close the open wound.  Clamp placed across the visible anal skin tag on the left posterior aspect, excess anal skin tissue removed with Bovie.  3-0 Vicryl then used to close the open wound in a running fashion.  A harmonic was placed across the base of the right anteriorpedicle and excess hemorrhoidal tissue removed.  Specimen was passed off operative field pending pathology.  3-0 Vicryl then used to close the open wound.  Hemostasis achieved with additional 3-0 vicryl suture in areas of bleeding until all active bleeding controlled.  Last inspection of the anal canal did not note any additional hemorrhoidal tissue and no other pathology. Exparel injected as  a perianal block. A gauze pad was tucked between the gluteal folds, and secured in place with mesh underwear.  The patient tolerated the procedure well and was taken to the postanesthesia care unit in stable condition.  Sponge and instrument count correct at end of procedure.

## 2020-03-27 NOTE — Op Note (Signed)
[facility] Gastroenterology Patient Name: Andrew Lyons Procedure Date: 03/26/2020 10:39 AM MRN: [id number] Account #: [id number] Date of Birth: 1945-12-04 Admit Type: Outpatient Age: 74 Room: OR 8 Gender: Male Note Status: Finalized Procedure:             Colonoscopy Indications:           High risk colon cancer surveillance: Personal history                         of colonic polyps Providers:             Eliseo Squires MD, MD Referring MD:          No Local Md, MD (Referring MD) Medicines:             Propofol per Anesthesia Complications:         No immediate complications. Procedure:             Pre-Anesthesia Assessment:                        - After reviewing the risks and benefits, the patient                         was deemed in satisfactory condition to undergo the                         procedure in an ambulatory setting.                        After obtaining informed consent, the colonoscope was                         passed under direct vision. Throughout the procedure,                         the patient's blood pressure, pulse, and oxygen                         saturations were monitored continuously. The                         Colonoscope was introduced through the anus and                         advanced to the the cecum, identified by the ileocecal                         valve. The colonoscopy was performed without                         difficulty. The patient tolerated the procedure well.                         The quality of the bowel preparation was fair. Findings:      Skin tags were found on perianal exam.      Two sessile polyps were found in the distal rectum and proximal sigmoid       colon. The polyps were 2 to 3 mm in size. Biopsies were taken with a  cold forceps for histology. Estimated blood loss was minimal.      Non-bleeding internal hemorrhoids were found during retroflexion. The       hemorrhoids were Grade I  (internal hemorrhoids that do not prolapse).      Multiple small-mouthed diverticula were found in the sigmoid colon. Impression:            - Preparation of the colon was fair.                        - Perianal skin tags found on perianal exam.                        - Two 2 mm polyps, removed with a cold biopsy forceps.                         Resected and retrieved. Biopsied. Recommendation:        - Written discharge instructions were provided to the                         patient.                        - Discharge patient to home.                        - Resume previous diet.                        - Await pathology results. Procedure Code(s):     --- Professional ---                        [phone], Colonoscopy, flexible; with biopsy, single or                         multiple Diagnosis Code(s):     --- Professional ---                        Z86.010, Personal history of colonic polyps                        K63.5, Polyp of colon                        K64.4, Residual hemorrhoidal skin tags CPT copyright 2019 [employer]. All rights reserved. The codes documented in this report are preliminary and upon coder review may  be revised to meet current compliance requirements. Dr. Sheppard Penton, MD Eliseo Squires MD, MD 03/26/2020 1:31:28 PM This report has been signed electronically. Number of Addenda: 0 Note Initiated On: 03/26/2020 10:39 AM Scope Withdrawal Time: 0 hours 13 minutes 58 seconds       [facility]

## 2020-03-28 ENCOUNTER — Other Ambulatory Visit: Payer: Self-pay | Admitting: Family Medicine

## 2020-03-28 NOTE — Telephone Encounter (Signed)
Requested Prescriptions  Pending Prescriptions Disp Refills  . metoprolol succinate (TOPROL-XL) 100 MG 24 hr tablet [Pharmacy Med Name: METOPROLOL SUCC ER 100 MG TAB] 90 tablet 0    Sig: TAKE 1 TABLET BY MOUTH EVERY DAY     Cardiovascular:  Beta Blockers Failed - 03/28/2020  9:52 AM      Failed - Last BP in normal range    BP Readings from Last 1 Encounters:  03/26/20 (!) 145/70         Passed - Last Heart Rate in normal range    Pulse Readings from Last 1 Encounters:  03/26/20 78         Passed - Valid encounter within last 6 months    Recent Outpatient Visits          2 months ago Annual physical exam   [facility] [name]., MD   8 months ago Type 2 diabetes mellitus with other circulatory complication, without long-term current use of insulin [facility])   [facility] [name]., MD   11 months ago Type 2 diabetes mellitus with other circulatory complication, without long-term current use of insulin [facility])   [facility] [name]., MD   1 year ago Type 2 diabetes mellitus with other circulatory complication, without long-term current use of insulin [facility])   [facility] [name]., MD   1 year ago Amputation stump infection [facility])   [facility] [name]., MD      Future Appointments            In 1 month [name]., MD [facility]

## 2020-03-31 ENCOUNTER — Other Ambulatory Visit: Payer: Self-pay | Admitting: Family Medicine

## 2020-04-01 ENCOUNTER — Other Ambulatory Visit: Payer: Self-pay | Admitting: Family Medicine

## 2020-04-01 NOTE — Telephone Encounter (Signed)
Requested Prescriptions  Pending Prescriptions Disp Refills   fluticasone (FLONASE) 50 MCG/ACT nasal spray [Pharmacy Med Name: FLUTICASONE PROP 50 MCG SPRAY] 48 mL 3    Sig: SPRAY 2 SPRAYS INTO EACH NOSTRIL EVERY DAY     Ear, Nose, and Throat: Nasal Preparations - Corticosteroids Passed - 04/01/2020  1:29 AM      Passed - Valid encounter within last 12 months    Recent Outpatient Visits          2 months ago Annual physical exam   [facility] [name]., MD   8 months ago Type 2 diabetes mellitus with other circulatory complication, without long-term current use of insulin [facility])   [facility] [name]., MD   11 months ago Type 2 diabetes mellitus with other circulatory complication, without long-term current use of insulin [facility])   [facility] [name]., MD   1 year ago Type 2 diabetes mellitus with other circulatory complication, without long-term current use of insulin [facility])   [facility] [name]., MD   1 year ago Amputation stump infection [facility])   [facility] [name]., MD      Future Appointments            In 1 month [name]., MD [facility]

## 2020-04-03 ENCOUNTER — Other Ambulatory Visit: Payer: Self-pay

## 2020-04-03 ENCOUNTER — Other Ambulatory Visit: Payer: Self-pay | Admitting: Family Medicine

## 2020-04-03 DIAGNOSIS — I10 Essential (primary) hypertension: Secondary | ICD-10-CM

## 2020-04-03 MED ORDER — METOPROLOL SUCCINATE ER 100 MG PO TB24: 100.0000 mg | ORAL_TABLET | Freq: Every day | ORAL | 0 refills | Status: DC

## 2020-04-21 ENCOUNTER — Other Ambulatory Visit: Payer: Self-pay

## 2020-04-25 ENCOUNTER — Other Ambulatory Visit: Payer: Self-pay | Admitting: Family Medicine

## 2020-04-25 DIAGNOSIS — E1159 Type 2 diabetes mellitus with other circulatory complications: Secondary | ICD-10-CM

## 2020-04-25 NOTE — Telephone Encounter (Signed)
Prescription expired- was not refilled at discharge. Per pharmacy: To pharmacy: NEED NEW RX WITH CORRECT DIRECTIONS FOR 1.8 MG DOSE DAILY PLEASE CTH  Routing to [facility].

## 2020-04-27 NOTE — Telephone Encounter (Signed)
Please advise refill? Rx has not been filled by provider before.

## 2020-05-15 NOTE — Progress Notes (Signed)
I,[name],acting as a scribe for Andrew Durie, MD.,have documented all relevant documentation on the behalf of Andrew Durie, MD,as directed by  Andrew Durie, MD while in the presence of Andrew Durie, MD.   Established patient visit   Patient: Andrew Lyons   DOB: Feb 23, 1946   74 y.o. Male  MRN: [id number] Visit Date: 05/18/2020  Today's healthcare provider: [name], MD   Chief Complaint  Patient presents with  . Diabetes  . Follow-up   Subjective    HPI  Diabetes Mellitus Type II, follow-up  Lab Results  Component Value Date   HGBA1C 6.8 (A) 05/18/2020   HGBA1C 6.7 (H) 01/17/2020   HGBA1C 6.2 (H) 07/18/2019   Last seen for diabetes 4 months ago.  Management since then includes continuing the same treatment. He reports good compliance with treatment. He is not having side effects. none  Home blood sugar records: fasting range: not checking  Episodes of hypoglycemia? No none   Current insulin regiment: n/a Most Recent Eye Exam: due  --------------------------------------------------------------------  Hypertension, follow-up  BP Readings from Last 3 Encounters:  05/18/20 (!) 146/84  03/26/20 (!) 145/70  01/15/20 (!) 146/75   Wt Readings from Last 3 Encounters:  05/18/20 259 lb (117.5 kg)  03/25/20 257 lb (116.6 kg)  01/15/20 254 lb (115.2 kg)     He was last seen for hypertension 4 months ago.  BP at that visit was 146/75. Management since that visit includes; labs checked showing-stable. Work on diet and exercise as discussed. He reports good compliance with treatment. He is not having side effects. none He is not exercising. He is adherent to low salt diet.   Outside blood pressures are not checking.  He does not smoke.  Use of agents associated with hypertension: none.   --------------------------------------------------------------------  BPH/ED From 01/15/2020-Refill Cialis 5 mg daily which is  helped both.      Medications: Outpatient Medications Prior to Visit  Medication Sig  . amLODipine-benazepril (LOTREL) 5-40 MG capsule TAKE 1 CAPSULE BY MOUTH EVERY DAY (Patient taking differently: Take 1 capsule by mouth daily. )  . B-D UF III MINI PEN NEEDLES 31G X 5 MM MISC USE WITH PEN DAILY  . Blood Glucose Monitoring Suppl (ONE TOUCH ULTRA SYSTEM KIT) w/Device KIT To use daily to check sugar. DX E11.9-needs one touch ultra meter  . cholecalciferol (VITAMIN D) 25 MCG (1000 UT) tablet Take 1,000 Units by mouth daily.   . clopidogrel (PLAVIX) 75 MG tablet Take 1 tablet (75 mg total) by mouth daily.  . Coenzyme Q10 100 MG TABS Take 100 mg by mouth daily.   . empagliflozin (JARDIANCE) 25 MG TABS tablet Take 25 mg by mouth daily.  . fluticasone (FLONASE) 50 MCG/ACT nasal spray SPRAY 2 SPRAYS INTO EACH NOSTRIL EVERY DAY  . Lancets (ONETOUCH ULTRASOFT) lancets USE 1  TO CHECK GLUCOSE THREE TIMES DAILY  . liraglutide (VICTOZA) 18 MG/3ML SOPN Inject 0.3 mLs (1.8 mg total) into the skin daily.  . metFORMIN (GLUCOPHAGE) 500 MG tablet TAKE 1 TABLET (500 MG TOTAL) BY MOUTH 2 (TWO) TIMES DAILY WITH A MEAL.  . metoprolol succinate (TOPROL-XL) 100 MG 24 hr tablet Take 1 tablet (100 mg total) by mouth daily. Take with or immediately following a meal.  . montelukast (SINGULAIR) 10 MG tablet TAKE 1 TABLET BY MOUTH EVERY DAY (Patient taking differently: Take 10 mg by mouth daily. )  . omeprazole (PRILOSEC) 40 MG capsule TAKE 1 CAPSULE  BY MOUTH EVERY DAY (Patient taking differently: Take 40 mg by mouth daily. )  . ONETOUCH ULTRA test strip USE 1 STRIP TO CHECK GLUCOSE TWICE DAILY AS NEEDED  . rosuvastatin (CRESTOR) 20 MG tablet TAKE 1 TABLET BY MOUTH ONCE DAILY  . sildenafil (VIAGRA) 100 MG tablet Take 50 mg by mouth daily as needed for erectile dysfunction.  . vitamin B-12 (CYANOCOBALAMIN) 1000 MCG tablet Take 1,000 mcg by mouth daily.  Marland Kitchen ibuprofen (ADVIL) 800 MG tablet Take 1 tablet (800 mg total) by  mouth every 8 (eight) hours as needed for mild pain or moderate pain. (Patient not taking: Reported on 05/18/2020)   No facility-administered medications prior to visit.    Review of Systems  Constitutional: Negative for appetite change, chills and fever.  Respiratory: Negative for chest tightness, shortness of breath and wheezing.   Cardiovascular: Negative for chest pain and palpitations.  Gastrointestinal: Negative for abdominal pain, nausea and vomiting.      Objective    BP (!) 146/84 (BP Location: Left Arm, Patient Position: Sitting, Cuff Size: Large)   Pulse 65   Temp 98.5 F (36.9 C) (Oral)   Resp 18   Ht _0  (1.778 m)   Wt 259 lb (117.5 kg)   SpO2 97%   BMI 37.16 kg/m    Physical Exam Vitals reviewed.  Constitutional:      Appearance: He is well-developed. He is obese.  HENT:     Head: Normocephalic and atraumatic.     Right Ear: External ear normal.     Left Ear: External ear normal.     Nose: Nose normal.  Eyes:     General: No scleral icterus.    Conjunctiva/sclera: Conjunctivae normal.  Neck:     Thyroid: No thyromegaly.  Cardiovascular:     Rate and Rhythm: Normal rate and regular rhythm.     Heart sounds: Normal heart sounds.  Pulmonary:     Effort: Pulmonary effort is normal.     Breath sounds: Normal breath sounds.  Abdominal:     Palpations: Abdomen is soft.  Musculoskeletal:     Comments: S/p Right BKA.    Skin:    General: Skin is warm and dry.  Neurological:     General: No focal deficit present.     Mental Status: He is alert and oriented to person, place, and time. Mental status is at baseline.  Psychiatric:        Mood and Affect: Mood normal.        Behavior: Behavior normal.        Thought Content: Thought content normal.        Judgment: Judgment normal.     BP (!) 146/84 (BP Location: Left Arm, Patient Position: Sitting, Cuff Size: Large)   Pulse 65   Temp 98.5 F (36.9 C) (Oral)   Resp 18   Ht _1  (1.778 m)   Wt  259 lb (117.5 kg)   SpO2 97%   BMI 37.16 kg/m   General Appearance:    Alert, cooperative, no distress, appears stated age  Head:    Normocephalic, without obvious abnormality, atraumatic  Eyes:    PERRL, conjunctiva/corneas clear, EOM's intact, fundi    benign, both eyes       Ears:    Normal TM's and external ear canals, both ears  Nose:   Nares normal, septum midline, mucosa normal, no drainage   or sinus tenderness  Throat:   Lips, mucosa, and tongue normal;  teeth and gums normal  Neck:   Supple, symmetrical, trachea midline, no adenopathy;       thyroid:  No enlargement/tenderness/nodules; no carotid   bruit or JVD  Back:     Symmetric, no curvature, ROM normal, no CVA tenderness  Lungs:     Clear to auscultation bilaterally, respirations unlabored  Chest wall:    No tenderness or deformity  Heart:    Regular rate and rhythm, S1 and S2 normal, no murmur, rub   or gallop  Abdomen:     Soft, non-tender, bowel sounds active all four quadrants,    no masses, no organomegaly  Genitalia:    Normal male without lesion, discharge or tenderness  Rectal:    Normal tone, normal prostate, no masses or tenderness;   guaiac negative stool  Extremities:   Extremities normal, atraumatic, no cyanosis or edema  Pulses:   2+ and symmetric all extremities  Skin:   Skin color, texture, turgor normal, no rashes or lesions  Lymph nodes:   Cervical, supraclavicular, and axillary nodes normal  Neurologic:   CNII-XII intact. Normal strength, sensation and reflexes      throughout     Results for orders placed or performed in visit on 05/18/20  POCT glycosylated hemoglobin (Hb A1C)  Result Value Ref Range   Hemoglobin A1C 6.8 (A) 4.0 - 5.6 %   Est. average glucose Bld gHb Est-mCnc 148     Assessment & Plan     1. Type 2 diabetes mellitus with other circulatory complication, without long-term current use of insulin (HCC) 6.8 today.  Stable on his last was 6.7 and he is below goal of 7.0. - POCT  glycosylated hemoglobin (Hb A1C)  2. Essential hypertension To check blood pressures at home try Omron or [place] blood pressure cuff.  3. Pure hypercholesterolemia On Crestor 10.  4. Benign prostatic hyperplasia with urinary hesitancy  5.  Status post right BKA Stump fits better recently. 6.  PAD Followed by vascular.   Return in about 3 months (around 08/18/2020).         Allyse Fregeau [name], MD  [facility] [phone] (phone) [phone] (fax)  [place]

## 2020-05-18 ENCOUNTER — Encounter: Payer: Self-pay | Admitting: Family Medicine

## 2020-05-18 ENCOUNTER — Ambulatory Visit (INDEPENDENT_AMBULATORY_CARE_PROVIDER_SITE_OTHER): Payer: PPO | Admitting: Family Medicine

## 2020-05-18 ENCOUNTER — Other Ambulatory Visit: Payer: Self-pay

## 2020-05-18 VITALS — BP 146/84 | HR 65 | Temp 98.5°F | Resp 18 | Ht 70.0 in | Wt 259.0 lb

## 2020-05-18 DIAGNOSIS — E1159 Type 2 diabetes mellitus with other circulatory complications: Secondary | ICD-10-CM

## 2020-05-18 DIAGNOSIS — Z89511 Acquired absence of right leg below knee: Secondary | ICD-10-CM

## 2020-05-18 DIAGNOSIS — N401 Enlarged prostate with lower urinary tract symptoms: Secondary | ICD-10-CM | POA: Diagnosis not present

## 2020-05-18 DIAGNOSIS — R3911 Hesitancy of micturition: Secondary | ICD-10-CM | POA: Diagnosis not present

## 2020-05-18 DIAGNOSIS — E7800 Pure hypercholesterolemia, unspecified: Secondary | ICD-10-CM | POA: Diagnosis not present

## 2020-05-18 DIAGNOSIS — I739 Peripheral vascular disease, unspecified: Secondary | ICD-10-CM

## 2020-05-18 DIAGNOSIS — I10 Essential (primary) hypertension: Secondary | ICD-10-CM | POA: Diagnosis not present

## 2020-05-18 LAB — POCT GLYCOSYLATED HEMOGLOBIN (HGB A1C)
Est. average glucose Bld gHb Est-mCnc: 148
Hemoglobin A1C: 6.8 % — AB (ref 4.0–5.6)

## 2020-05-18 NOTE — Patient Instructions (Signed)
To check blood pressures at home try Omron or [place] blood pressure cuff.

## 2020-05-29 ENCOUNTER — Ambulatory Visit: Admit: 2020-05-29 | Payer: PPO | Admitting: General Surgery

## 2020-05-29 SURGERY — COLONOSCOPY WITH PROPOFOL
Anesthesia: General

## 2020-06-04 DIAGNOSIS — E119 Type 2 diabetes mellitus without complications: Secondary | ICD-10-CM | POA: Diagnosis not present

## 2020-06-04 LAB — HM DIABETES EYE EXAM

## 2020-06-05 ENCOUNTER — Other Ambulatory Visit: Payer: Self-pay | Admitting: Family Medicine

## 2020-06-05 DIAGNOSIS — E1159 Type 2 diabetes mellitus with other circulatory complications: Secondary | ICD-10-CM

## 2020-06-05 NOTE — Telephone Encounter (Signed)
Please advise? Medication never filled by pcp.

## 2020-06-05 NOTE — Telephone Encounter (Signed)
Requested medication (s) are due for refill today: yes   Requested medication (s) are on the active medication list: yes   Last refill:  04/28/2020  Future visit scheduled: yes   Notes to clinic: medication filled by historical provider  Review for refill    Requested Prescriptions  Pending Prescriptions Disp Refills   JARDIANCE 25 MG TABS tablet [Pharmacy Med Name: JARDIANCE 25 MG TAB] 90 tablet     Sig: TAKE 1 TABLET BY MOUTH ONCE DAILY      Endocrinology:  Diabetes - SGLT2 Inhibitors Failed - 06/05/2020 11:54 AM      Failed - LDL in normal range and within 360 days    LDL Cholesterol (Calc)  Date Value Ref Range Status  09/21/2017 71 mg/dL (calc) Final    Comment:    Reference range: <100 . Desirable range <100 mg/dL for primary prevention;   <70 mg/dL for patients with CHD or diabetic patients  with > or = 2 CHD risk factors. Marland Kitchen LDL-C is now calculated using the Martin-Hopkins  calculation, which is a validated novel method providing  better accuracy than the Friedewald equation in the  estimation of LDL-C.  Cresenciano Genre et al. Annamaria Helling. 4098;119(14): 2061-2068  (http://education.[employer].com/faq/FAQ164)    LDL Chol Calc (NIH)  Date Value Ref Range Status  01/17/2020 68 0 - 99 mg/dL Final          Passed - Cr in normal range and within 360 days    Creatinine, Ser  Date Value Ref Range Status  01/17/2020 1.13 0.76 - 1.27 mg/dL Final          Passed - HBA1C is between 0 and 7.9 and within 180 days    Hemoglobin A1C  Date Value Ref Range Status  05/18/2020 6.8 (A) 4.0 - 5.6 % Final   Hgb A1c MFr Bld  Date Value Ref Range Status  01/17/2020 6.7 (H) 4.8 - 5.6 % Final    Comment:             Prediabetes: 5.7 - 6.4          Diabetes: >6.4          Glycemic control for adults with diabetes: <7.0           Passed - eGFR in normal range and within 360 days    GFR calc Af Amer  Date Value Ref Range Status  01/17/2020 75 >59 mL/min/1.73 Final    Comment:     **[facility] currently reports eGFR in compliance with the current**   recommendations of the [employer]. [facility] will   update reporting as new guidelines are published from the NKF-ASN   Task force.    GFR calc non Af Amer  Date Value Ref Range Status  01/17/2020 65 >59 mL/min/1.73 Final          Passed - Valid encounter within last 6 months    Recent Outpatient Visits           2 weeks ago Type 2 diabetes mellitus with other circulatory complication, without long-term current use of insulin [facility])   [facility] [name]., MD   4 months ago Annual physical exam   [facility] [name]., MD   10 months ago Type 2 diabetes mellitus with other circulatory complication, without long-term current use of insulin [facility])   [facility] [name]., MD   1 year ago Type 2 diabetes mellitus with  other circulatory complication, without long-term current use of insulin [facility])   [facility] [name]., MD   1 year ago Type 2 diabetes mellitus with other circulatory complication, without long-term current use of insulin [facility])   [facility] [name]., MD       Future Appointments             In 3 months [name]., MD [facility]

## 2020-06-08 ENCOUNTER — Other Ambulatory Visit: Payer: Self-pay | Admitting: Family Medicine

## 2020-06-08 ENCOUNTER — Encounter: Payer: Self-pay | Admitting: Family Medicine

## 2020-06-08 DIAGNOSIS — I10 Essential (primary) hypertension: Secondary | ICD-10-CM

## 2020-06-08 LAB — HM DIABETES EYE EXAM

## 2020-06-09 ENCOUNTER — Encounter: Payer: Self-pay | Admitting: *Deleted

## 2020-06-25 DIAGNOSIS — I10 Essential (primary) hypertension: Secondary | ICD-10-CM | POA: Diagnosis not present

## 2020-06-25 DIAGNOSIS — I6523 Occlusion and stenosis of bilateral carotid arteries: Secondary | ICD-10-CM | POA: Diagnosis not present

## 2020-06-25 DIAGNOSIS — Q231 Congenital insufficiency of aortic valve: Secondary | ICD-10-CM | POA: Diagnosis not present

## 2020-06-25 DIAGNOSIS — E782 Mixed hyperlipidemia: Secondary | ICD-10-CM | POA: Diagnosis not present

## 2020-06-25 DIAGNOSIS — E1169 Type 2 diabetes mellitus with other specified complication: Secondary | ICD-10-CM | POA: Diagnosis not present

## 2020-06-25 DIAGNOSIS — I2510 Atherosclerotic heart disease of native coronary artery without angina pectoris: Secondary | ICD-10-CM | POA: Diagnosis not present

## 2020-06-25 DIAGNOSIS — G4733 Obstructive sleep apnea (adult) (pediatric): Secondary | ICD-10-CM | POA: Diagnosis not present

## 2020-07-01 ENCOUNTER — Other Ambulatory Visit: Payer: Self-pay | Admitting: Family Medicine

## 2020-07-17 ENCOUNTER — Other Ambulatory Visit: Payer: Self-pay | Admitting: Family Medicine

## 2020-07-17 NOTE — Telephone Encounter (Signed)
Medication Refill - Medication: fluticasone (FLONASE) 50 MCG/ACT nasal spray   Has the patient contacted their pharmacy? Yes.   (Agent: If no, request that the patient contact the pharmacy for the refill.) (Agent: If yes, when and what did the pharmacy advise?)  Preferred Pharmacy (with phone number or street name):  [place], [place] [address] [place] [address]  Phone: [phone] Fax: [phone]     Agent: Please be advised that RX refills may take up to 3 business days. We ask that you follow-up with your pharmacy.

## 2020-07-17 NOTE — Telephone Encounter (Signed)
  Notes to clinic No signature of directions of use, please assess

## 2020-07-21 ENCOUNTER — Other Ambulatory Visit: Payer: Self-pay

## 2020-07-21 MED ORDER — FLUTICASONE PROPIONATE 50 MCG/ACT NA SUSP: NASAL | 3 refills | Status: DC

## 2020-08-11 ENCOUNTER — Ambulatory Visit (INDEPENDENT_AMBULATORY_CARE_PROVIDER_SITE_OTHER): Payer: PPO

## 2020-08-11 ENCOUNTER — Other Ambulatory Visit: Payer: Self-pay

## 2020-08-11 ENCOUNTER — Ambulatory Visit (INDEPENDENT_AMBULATORY_CARE_PROVIDER_SITE_OTHER): Payer: PPO | Admitting: Vascular Surgery

## 2020-08-11 ENCOUNTER — Encounter (INDEPENDENT_AMBULATORY_CARE_PROVIDER_SITE_OTHER): Payer: Self-pay | Admitting: Vascular Surgery

## 2020-08-11 VITALS — BP 149/68 | HR 72 | Resp 16 | Wt 259.2 lb

## 2020-08-11 DIAGNOSIS — I739 Peripheral vascular disease, unspecified: Secondary | ICD-10-CM

## 2020-08-11 DIAGNOSIS — E1159 Type 2 diabetes mellitus with other circulatory complications: Secondary | ICD-10-CM | POA: Diagnosis not present

## 2020-08-11 DIAGNOSIS — E7800 Pure hypercholesterolemia, unspecified: Secondary | ICD-10-CM

## 2020-08-11 DIAGNOSIS — Z89511 Acquired absence of right leg below knee: Secondary | ICD-10-CM

## 2020-08-11 DIAGNOSIS — I10 Essential (primary) hypertension: Secondary | ICD-10-CM

## 2020-08-11 NOTE — Assessment & Plan Note (Signed)
His left ABI today is 1.32 with brisk waveforms and normal digital pressures consistent with no arterial insufficiency.  Right BKA well-healed.  Follow annually.  No change in medical regimen.

## 2020-08-11 NOTE — Progress Notes (Signed)
MRN : [id number]  Andrew Lyons is a 74 y.o. (21-Mar-1946) male who presents with chief complaint of  Chief Complaint  Patient presents with  . Follow-up    ultrasound follow up  .  History of Present Illness: Patient returns today in follow up of his PAD as well as his right below-knee amputation.  He is doing well.  He walks with his prosthesis without any issues or problems.  He is not having any left leg issues in terms of rest pain, ulceration, or claudication.  His left ABI today is 1.32 with brisk waveforms and normal digital pressures consistent with no arterial insufficiency.  Current Outpatient Medications  Medication Sig Dispense Refill  . amLODipine-benazepril (LOTREL) 5-40 MG capsule TAKE 1 CAPSULE BY MOUTH EVERY DAY (Patient taking differently: Take 1 capsule by mouth daily. ) 90 capsule 3  . B-D UF III MINI PEN NEEDLES 31G X 5 MM MISC USE WITH PEN DAILY 100 each 3  . Blood Glucose Monitoring Suppl (ONE TOUCH ULTRA SYSTEM KIT) w/Device KIT To use daily to check sugar. DX E11.9-needs one touch ultra meter 1 each 0  . cholecalciferol (VITAMIN D) 25 MCG (1000 UT) tablet Take 1,000 Units by mouth daily.     . clopidogrel (PLAVIX) 75 MG tablet Take 1 tablet (75 mg total) by mouth daily. 90 tablet 3  . Coenzyme Q10 100 MG TABS Take 100 mg by mouth daily.     . fluticasone (FLONASE) 50 MCG/ACT nasal spray SPRAY 2 SPRAYS INTO EACH NOSTRIL EVERY DAY 48 mL 3  . JARDIANCE 25 MG TABS tablet TAKE 1 TABLET BY MOUTH ONCE DAILY 90 tablet 3  . Lancets (ONETOUCH ULTRASOFT) lancets USE 1  TO CHECK GLUCOSE THREE TIMES DAILY 100 each 11  . liraglutide (VICTOZA) 18 MG/3ML SOPN Inject 0.3 mLs (1.8 mg total) into the skin daily. 3 pen 11  . metFORMIN (GLUCOPHAGE) 500 MG tablet TAKE 1 TABLET (500 MG TOTAL) BY MOUTH 2 (TWO) TIMES DAILY WITH A MEAL. 180 tablet 3  . metoprolol succinate (TOPROL-XL) 100 MG 24 hr tablet TAKE 1 TABLET BY MOUTH ONCE A DAY WITH OR IMMEDIATELY FOLLOWING A MEAL 90 tablet 3   . montelukast (SINGULAIR) 10 MG tablet TAKE 1 TABLET BY MOUTH EVERY DAY (Patient taking differently: Take 10 mg by mouth daily. ) 90 tablet 2  . omeprazole (PRILOSEC) 40 MG capsule TAKE 1 CAPSULE BY MOUTH EVERY DAY (Patient taking differently: Take 40 mg by mouth daily. ) 90 capsule 2  . ONETOUCH ULTRA test strip USE 1 STRIP TO CHECK GLUCOSE TWICE DAILY AS NEEDED 100 each 0  . rosuvastatin (CRESTOR) 20 MG tablet TAKE 1 TABLET BY MOUTH ONCE DAILY 90 tablet 0  . sildenafil (VIAGRA) 100 MG tablet Take 50 mg by mouth daily as needed for erectile dysfunction.    . vitamin B-12 (CYANOCOBALAMIN) 1000 MCG tablet Take 1,000 mcg by mouth daily.    Marland Kitchen ibuprofen (ADVIL) 800 MG tablet Take 1 tablet (800 mg total) by mouth every 8 (eight) hours as needed for mild pain or moderate pain. (Patient not taking: Reported on 05/18/2020) 30 tablet 0   No current facility-administered medications for this visit.    Past Medical History:  Diagnosis Date  . 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 Barrett's esophagus with dysplasia   . Cataract   . COPD (chronic obstructive pulmonary disease) ([place])   . Coronary artery disease   . Diabetes mellitus without complication ([place])   .  GERD (gastroesophageal reflux disease)   . Hepatitis   . Hx of dysplastic nevus 2007   multiple sites   . Hyperlipidemia   . Hypertension   . Myocardial infarction ([place])   . Seizures ([place])   . Sleep apnea     Past Surgical History:  Procedure Laterality Date  . ABDOMINAL AORTOGRAM W/LOWER EXTREMITY N/A 06/20/2018   Procedure: ABDOMINAL AORTOGRAM W/LOWER EXTREMITY;  Surgeon: Wellington Hampshire, MD;  Location: [facility];  Service: Cardiovascular;  Laterality: N/A;  . AMPUTATION Right 10/27/2018   Procedure: AMPUTATION BELOW KNEE;  Surgeon: Leim Fabry, MD;  Location: [facility];  Service: Orthopedics;  Laterality: Right;  . CARDIAC CATHETERIZATION     with Angioplasty  . cardiac stents    . CARDIAC VALVE REPLACEMENT       Aortic Valve Replacement  . COLONOSCOPY N/A 03/26/2020   Procedure: COLONOSCOPY;  Surgeon: Benjamine Sprague, DO;  Location: [facility];  Service: General;  Laterality: N/A;  . COLONOSCOPY WITH PROPOFOL N/A 03/18/2015   Procedure: COLONOSCOPY WITH PROPOFOL;  Surgeon: [name], MD;  Location: [facility];  Service: Endoscopy;  Laterality: N/A;  . ESOPHAGOGASTRODUODENOSCOPY N/A 03/18/2015   Procedure: ESOPHAGOGASTRODUODENOSCOPY (EGD);  Surgeon: [name], MD;  Location: [facility]COPY;  Service: Endoscopy;  Laterality: N/A;  . FOOT AMPUTATION    . GREEN LIGHT LASER TURP (TRANSURETHRAL RESECTION OF PROSTATE N/A 10/16/2018   Procedure: GREEN LIGHT LASER TURP (TRANSURETHRAL RESECTION OF PROSTATE;  Surgeon: Royston Cowper, MD;  Location: [facility];  Service: Urology;  Laterality: N/A;  . HARDWARE REMOVAL Right 10/27/2018   Procedure: HARDWARE REMOVAL;  Surgeon: Leim Fabry, MD;  Location: [facility];  Service: Orthopedics;  Laterality: Right;  . HEMORRHOID SURGERY N/A 03/26/2020   Procedure: HEMORRHOIDECTOMY;  Surgeon: Benjamine Sprague, DO;  Location: [facility];  Service: General;  Laterality: N/A;  . LOWER EXTREMITY ANGIOGRAPHY Right 10/25/2018   Procedure: Lower Extremity Angiography;  Surgeon: Algernon Huxley, MD;  Location: [facility];  Service: Cardiovascular;  Laterality: Right;  . PERIPHERAL VASCULAR INTERVENTION Right 06/20/2018   Procedure: PERIPHERAL VASCULAR INTERVENTION;  Surgeon: Wellington Hampshire, MD;  Location: [facility] [facility];  Service: Cardiovascular;  Laterality: Right;  . THORACOTOMY Right   . TONSILLECTOMY       Social History   Tobacco Use  . Smoking status: Former Smoker    Quit date: 10/02/2001    Years since quitting: 18.8  . Smokeless tobacco: Former Systems developer  . Tobacco comment: Quit smoking in 2003; Started smoking at age 55, smoked about 40 years, smoked over 3 packs per day  Vaping Use  . Vaping Use: Never used  Substance Use Topics  . Alcohol use: Not  Currently  . Drug use: No      Family History  Problem Relation Age of Onset  . Obesity Son   . Diabetes Brother   . Hypertension Brother   . Heart attack Mother   . Heart attack Father      Allergies  Allergen Reactions  . Iodinated Diagnostic Agents Rash    had a red chest -unusre of what kind of dye it was  . Zyban [Bupropion] Rash  . Zocor [Simvastatin] Other (See Comments)    Reaction: hepatitis   . Gadolinium Derivatives Rash    REVIEW OF SYSTEMS(Negative unless checked)  Constitutional: _0 ??Weight loss _1 ??Fever _2 ??Chills Cardiac: _3 ??Chest pain _4 ??Chest pressure _5 ??Palpitations _6 ??Shortness of breath when laying flat _7 ??Shortness of breath at rest _8 ??Shortness of breath  with exertion. Vascular: _0 ??Pain in legs with walking _1 ??Pain in legs at rest _2 ??Pain in legs when laying flat _3 ??Claudication _4 ??Pain in feet when walking _5 ??Pain in feet at rest _6 ??Pain in feet when laying flat _7 ??History of DVT _8 ??Phlebitis _9 ??Swelling in legs _10 ??Varicose veins _11 ??Non-healing ulcers Pulmonary: _12 ??Uses home oxygen _13 ??Productive cough _14 ??Hemoptysis _15 ??Wheeze _16 ??COPD _17 ??Asthma Neurologic: _18 ??Dizziness _19 ??Blackouts _20 ??Seizures _21 ??History of stroke _22 ??History of TIA _23 ??Aphasia _24 ??Temporary blindness _25 ??Dysphagia _26 ??Weakness or numbness in arms _27 ??Weakness or numbness in legs Musculoskeletal: _28 ??Arthritis _29 ??Joint swelling _30 ??Joint pain _31 ??Low back pain Hematologic: _32 ??Easy bruising _33 ??Easy bleeding _34 ??Hypercoagulable state _35 ??Anemic  Gastrointestinal: _36 ??Blood in stool _37 ??Vomiting blood _38 ??Gastroesophageal reflux/heartburn _39 ??Abdominal pain Genitourinary: _40 ??Chronic kidney disease _41 ??Difficult urination _42 ??Frequent urination _43 ??Burning with urination _44 ??Hematuria Skin: _45 ??Rashes _46 ??Ulcers _47 ??Wounds Psychological: _48 ??History of anxiety  _49 ??History of major depression.  Physical Examination  BP (!) 149/68 (BP Location: Right Arm)   Pulse 72   Resp 16   Wt 259 lb 3.2 oz (117.6 kg)   BMI 37.19 kg/m  Gen:  WD/WN, NAD Head: [place]/AT, No temporalis wasting. Ear/Nose/Throat: Hearing grossly intact, nares w/o erythema or drainage Eyes: Conjunctiva clear. Sclera non-icteric Neck: Supple.  Trachea midline Pulmonary:  Good air movement, no use of accessory muscles.  Cardiac: RRR, no JVD Vascular:  Vessel Right Left  Radial Palpable Palpable                          PT Not Palpable Palpable  DP Not Palpable Palpable   Gastrointestinal: soft, non-tender/non-distended. No guarding/reflex.  Musculoskeletal: M/S 5/5 throughout.  No deformity or atrophy. Right BKA with prosthesis. No left leg edema. Neurologic: Sensation grossly intact in extremities.  Symmetrical.  Speech is fluent.  Psychiatric: Judgment intact, Mood & affect appropriate for pt's clinical situation. Dermatologic: No rashes or ulcers noted.  No cellulitis or open wounds.       Labs Recent Results (from the past 2160 hour(s))  POCT glycosylated hemoglobin (Hb A1C)     Status: Abnormal   Collection Time: 05/18/20 10:48 AM  Result Value Ref Range   Hemoglobin A1C 6.8 (A) 4.0 - 5.6 %   Est. average glucose Bld gHb Est-mCnc 148   HM DIABETES EYE EXAM     Status: None   Collection Time: 06/04/20 12:00 AM  Result Value Ref Range   HM Diabetic Eye Exam No Retinopathy No Retinopathy  HM DIABETES EYE EXAM     Status: None   Collection Time: 06/08/20  4:38 PM  Result Value Ref Range   HM Diabetic Eye Exam No Retinopathy No Retinopathy    Radiology No results found.  Assessment/Plan Benign essential HTN blood pressure control important in reducing the progression of atherosclerotic disease. On appropriate oral medications.   Diabetes blood glucose control important in reducing the progression of atherosclerotic disease. Also, involved in  wound healing. On appropriate medications.   HLD (hyperlipidemia) lipid control important in reducing the progression of atherosclerotic disease. Continue statin therapy  Hx of BKA, right ([place]) Now healed with a prosthesis  PAD (peripheral artery disease) ([place]) His left ABI today is 1.32 with brisk waveforms and normal digital pressures consistent with no arterial insufficiency.  Right BKA well-healed.  Follow annually.  No change in medical regimen.    Leotis Pain, MD  08/11/2020 10:26 AM    This note was created with Dragon medical transcription system.  Any errors from dictation are purely unintentional

## 2020-09-03 ENCOUNTER — Other Ambulatory Visit: Payer: Self-pay | Admitting: Family Medicine

## 2020-09-14 NOTE — Progress Notes (Signed)
Subjective:   Andrew Lyons is a 74 y.o. male who presents for Medicare Annual/Subsequent preventive examination.  I connected with [name] today by telephone and verified that I am speaking with the correct person using two identifiers. Location patient: home Location provider: work Persons participating in the virtual visit: patient, provider.   I discussed the limitations, risks, security and privacy concerns of performing an evaluation and management service by telephone and the availability of in person appointments. I also discussed with the patient that there may be a patient responsible charge related to this service. The patient expressed understanding and verbally consented to this telephonic visit.    Interactive audio and video telecommunications were attempted between this provider and patient, however failed, due to patient having technical difficulties OR patient did not have access to video capability.  We continued and completed visit with audio only.   Review of Systems    N/A  Cardiac Risk Factors include: advanced age (>52mn, >>69women);diabetes mellitus;dyslipidemia;male gender;hypertension;obesity (BMI >30kg/m2)     Objective:    There were no vitals filed for this visit. There is no height or weight on file to calculate BMI.  Advanced Directives 09/15/2020 03/25/2020 08/15/2019 10/25/2018 10/24/2018 10/02/2018 08/10/2018  Does Patient Have a Medical Advance Directive? _0  No No  Type of Advance Directive - - - - - - -  Copy of [place] Chart? - - - - - - -  Would patient like information on creating a medical advance directive? No - Patient declined No - Patient declined No - Patient declined No - Patient declined No - Patient declined - No - Patient declined    Current Medications (verified) Outpatient Encounter Medications as of 09/15/2020  Medication Sig  . amLODipine-benazepril (LOTREL) 5-40 MG capsule TAKE 1  CAPSULE BY MOUTH EVERY DAY (Patient taking differently: Take 1 capsule by mouth daily.)  . cholecalciferol (VITAMIN D) 25 MCG (1000 UT) tablet Take 1,000 Units by mouth daily.   . clopidogrel (PLAVIX) 75 MG tablet Take 1 tablet (75 mg total) by mouth daily.  . Coenzyme Q10 100 MG TABS Take 100 mg by mouth daily.   . fluticasone (FLONASE) 50 MCG/ACT nasal spray SPRAY 2 SPRAYS INTO EACH NOSTRIL EVERY DAY  . JARDIANCE 25 MG TABS tablet TAKE 1 TABLET BY MOUTH ONCE DAILY  . liraglutide (VICTOZA) 18 MG/3ML SOPN Inject 0.3 mLs (1.8 mg total) into the skin daily.  . metFORMIN (GLUCOPHAGE) 500 MG tablet TAKE 1 TABLET (500 MG TOTAL) BY MOUTH 2 (TWO) TIMES DAILY WITH A MEAL.  . metoprolol succinate (TOPROL-XL) 100 MG 24 hr tablet TAKE 1 TABLET BY MOUTH ONCE A DAY WITH OR IMMEDIATELY FOLLOWING A MEAL  . montelukast (SINGULAIR) 10 MG tablet TAKE 1 TABLET BY MOUTH EVERY DAY (Patient taking differently: Take 10 mg by mouth daily.)  . omeprazole (PRILOSEC) 40 MG capsule TAKE 1 CAPSULE BY MOUTH EVERY DAY (Patient taking differently: Take 40 mg by mouth daily.)  . rosuvastatin (CRESTOR) 20 MG tablet TAKE 1 TABLET BY MOUTH ONCE DAILY  . sildenafil (VIAGRA) 100 MG tablet Take 50 mg by mouth daily as needed for erectile dysfunction.  . vitamin B-12 (CYANOCOBALAMIN) 1000 MCG tablet Take 1,000 mcg by mouth daily.  . B-D UF III MINI PEN NEEDLES 31G X 5 MM MISC USE WITH PEN DAILY (Patient not taking: Reported on 09/15/2020)  . Blood Glucose Monitoring Suppl (ONE TOUCH ULTRA SYSTEM KIT) w/Device KIT To use  daily to check sugar. DX E11.9-needs one touch ultra meter (Patient not taking: Reported on 09/15/2020)  . ibuprofen (ADVIL) 800 MG tablet Take 1 tablet (800 mg total) by mouth every 8 (eight) hours as needed for mild pain or moderate pain. (Patient not taking: No sig reported)  . Lancets (ONETOUCH ULTRASOFT) lancets USE 1  TO CHECK GLUCOSE THREE TIMES DAILY (Patient not taking: Reported on 09/15/2020)  . ONETOUCH ULTRA  test strip USE 1 STRIP TO CHECK GLUCOSE TWICE DAILY AS NEEDED (Patient not taking: Reported on 09/15/2020)   No facility-administered encounter medications on file as of 09/15/2020.    Allergies (verified) Iodinated diagnostic agents, Zyban [bupropion], Zocor [simvastatin], and Gadolinium derivatives   History: Past Medical History:  Diagnosis Date  . 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 Barrett's esophagus with dysplasia   . Cataract   . COPD (chronic obstructive pulmonary disease) ([place])   . Coronary artery disease   . Diabetes mellitus without complication ([place])   . GERD (gastroesophageal reflux disease)   . Hepatitis   . Hx of dysplastic nevus 2007   multiple sites   . Hyperlipidemia   . Hypertension   . Myocardial infarction ([place])   . Seizures ([place])   . Sleep apnea    Past Surgical History:  Procedure Laterality Date  . ABDOMINAL AORTOGRAM W/LOWER EXTREMITY N/A 06/20/2018   Procedure: ABDOMINAL AORTOGRAM W/LOWER EXTREMITY;  Surgeon: Wellington Hampshire, MD;  Location: [facility];  Service: Cardiovascular;  Laterality: N/A;  . AMPUTATION Right 10/27/2018   Procedure: AMPUTATION BELOW KNEE;  Surgeon: Leim Fabry, MD;  Location: [facility];  Service: Orthopedics;  Laterality: Right;  . CARDIAC CATHETERIZATION     with Angioplasty  . cardiac stents    . CARDIAC VALVE REPLACEMENT     Aortic Valve Replacement  . COLONOSCOPY N/A 03/26/2020   Procedure: COLONOSCOPY;  Surgeon: Benjamine Sprague, DO;  Location: [facility];  Service: General;  Laterality: N/A;  . COLONOSCOPY WITH PROPOFOL N/A 03/18/2015   Procedure: COLONOSCOPY WITH PROPOFOL;  Surgeon: [name], MD;  Location: [facility] ENDOSCOPY;  Service: Endoscopy;  Laterality: N/A;  . ESOPHAGOGASTRODUODENOSCOPY N/A 03/18/2015   Procedure: ESOPHAGOGASTRODUODENOSCOPY (EGD);  Surgeon: [name], MD;  Location: [facility] END[facility];  Service: Endoscopy;  Laterality: N/A;  . FOOT AMPUTATION    . GREEN LIGHT LASER TURP  (TRANSURETHRAL RESECTION OF PROSTATE N/A 10/16/2018   Procedure: GREEN LIGHT LASER TURP (TRANSURETHRAL RESECTION OF PROSTATE;  Surgeon: Royston Cowper, MD;  Location: [facility];  Service: Urology;  Laterality: N/A;  . HARDWARE REMOVAL Right 10/27/2018   Procedure: HARDWARE REMOVAL;  Surgeon: Leim Fabry, MD;  Location: [facility];  Service: Orthopedics;  Laterality: Right;  . HEMORRHOID SURGERY N/A 03/26/2020   Procedure: HEMORRHOIDECTOMY;  Surgeon: Benjamine Sprague, DO;  Location: [facility];  Service: General;  Laterality: N/A;  . LOWER EXTREMITY ANGIOGRAPHY Right 10/25/2018   Procedure: Lower Extremity Angiography;  Surgeon: Algernon Huxley, MD;  Location: [facility];  Service: Cardiovascular;  Laterality: Right;  . PERIPHERAL VASCULAR INTERVENTION Right 06/20/2018   Procedure: PERIPHERAL VASCULAR INTERVENTION;  Surgeon: Wellington Hampshire, MD;  Location: [facility];  Service: Cardiovascular;  Laterality: Right;  . THORACOTOMY Right   . TONSILLECTOMY     Family History  Problem Relation Age of Onset  . Obesity Son   . Diabetes Brother   . Hypertension Brother   . Heart attack Mother   . Heart attack Father  Social History   Socioeconomic History  . Marital status: Married    Spouse name: Not on file  . Number of children: 2  . Years of education: Not on file  . Highest education level: 8th grade  Occupational History  . Occupation: retired  Tobacco Use  . Smoking status: Former Smoker    Quit date: 10/02/2001    Years since quitting: 18.9  . Smokeless tobacco: Former Systems developer  . Tobacco comment: Quit smoking in 2003; Started smoking at age 45, smoked about 40 years, smoked over 3 packs per day  Vaping Use  . Vaping Use: Never used  Substance and Sexual Activity  . Alcohol use: Not Currently  . Drug use: No  . Sexual activity: Not on file  Other Topics Concern  . Not on file  Social History Narrative  . Not on file   Social Determinants of Health   Financial Resource  Strain: Low Risk   . Difficulty of Paying Living Expenses: Not hard at all  Food Insecurity: No Food Insecurity  . Worried About Charity fundraiser in the Last Year: Never true  . Ran Out of Food in the Last Year: Never true  Transportation Needs: No Transportation Needs  . Lack of Transportation (Medical): No  . Lack of Transportation (Non-Medical): No  Physical Activity: Inactive  . Days of Exercise per Week: 0 days  . Minutes of Exercise per Session: 0 min  Stress: No Stress Concern Present  . Feeling of Stress : Not at all  Social Connections: Moderately Isolated  . Frequency of Communication with Friends and Family: More than three times a week  . Frequency of Social Gatherings with Friends and Family: More than three times a week  . Attends Religious Services: Never  . Active Member of Clubs or Organizations: No  . Attends Archivist Meetings: Never  . Marital Status: Married    Tobacco Counseling Counseling given: Not Answered Comment: Quit smoking in 2003; Started smoking at age 65, smoked about 40 years, smoked over 3 packs per day   Clinical Intake:  Pre-visit preparation completed: Yes  Pain : No/denies pain     Nutritional Risks: None Diabetes: Yes  How often do you need to have someone help you when you read instructions, pamphlets, or other written materials from your doctor or pharmacy?: 1 - Never  Diabetic? Yes  Nutrition Risk Assessment:  Has the patient had any N/V/D within the last 2 months?  No  Does the patient have any non-healing wounds?  No  Has the patient had any unintentional weight loss or weight gain? No  Diabetes:  Is the patient diabetic? Yes If diabetic, was a CBG obtained today?  No  Did the patient bring in their glucometer from home?  No  How often do you monitor your CBG's? Not checking recently due to "tired of sticking arm." Declined looking into another way of trying to check BS at this time.   Financial Strains  and Diabetes Management:  Are you having any financial strains with the device, your supplies or your medication? No .  Does the patient want to be seen by Chronic Care Management for management of their diabetes?  No  Would the patient like to be referred to a Nutritionist or for Diabetic Management?  No   Diabetic Exams:  Diabetic Eye Exam: Completed 06/08/20 Diabetic Foot Exam: Overdue, Pt has been advised about the importance in completing this exam. Note made to follow  up on this at next in office visit.    Interpreter Needed?: No  Information entered by :: [facility], LPN   Activities of Daily Living In your present state of health, do you have any difficulty performing the following activities: 09/15/2020 03/25/2020  Hearing? Y Y  Comment Does not wear hearing aids. -  Vision? N Y  Difficulty concentrating or making decisions? N Y  Walking or climbing stairs? N Y  Dressing or bathing? N N  Doing errands, shopping? N N  Preparing Food and eating ? N -  Using the Toilet? N -  In the past six months, have you accidently leaked urine? N -  Do you have problems with loss of bowel control? N -  Managing your Medications? N -  Managing your Finances? N -  Housekeeping or managing your Housekeeping? N -  Some recent data might be hidden    Patient Care Team: [name]., MD as PCP - General (Family Medicine) Corey Skains, MD as Consulting Physician (Cardiology) Lucky Cowboy Erskine Squibb, MD as Referring Physician (Vascular Surgery) Ralene Bathe, MD (Dermatology) Earnestine Leys, MD (Orthopedic Surgery) Benjamine Sprague, DO as Consulting Physician (Surgery) Marchia Meiers, MD as Consulting Physician (Ophthalmology)  Indicate any recent Medical Services you may have received from other than [facility] providers in the past year (date may be approximate).     Assessment:   This is a routine wellness examination for Andrew Lyons.  Hearing/Vision screen No exam data  present  Dietary issues and exercise activities discussed: Current Exercise Habits: The patient does not participate in regular exercise at present, Exercise limited by: orthopedic condition(s)  Goals      Patient Stated   .  I would like to come off of my Prilosec (pt-stated)      Current Barriers:  Pill burden  Pharmacist Clinical Goal(s):  Marland Kitchen Over the next 30 days, patient and pharmacist will work together to develop a taper to come off of omeprazole safely and with minimal side effects as reported by patient.   Interventions: . Reviewed use, side effects, and adverse events of omeprazole . Developed taper for DC omeprazole; approved taper with Dr. Rosanna Randy  Patient Self Care Activities:  . Self administers medications as prescribed  Initial goal documentation     .  Medication assistance (pt-stated)      Current Barriers:  . financial  Pharmacist Clinical Goal(s): Over the next 14 days, Andrew Lyons will provide the necessary supplementary documents (proof of out of pocket prescription expenditure, proof of household income) needed for medication assistance applications to CCM pharmacist.   Interventions: . CCM pharmacist will apply for medication assistance program for Victoza made by [employer], and Jardiance made by [employer].    Patient Self Care Activities:  Marland Kitchen Gather necessary documents needed to apply for medication assistance  Initial goal documentation        Other   .  DIET - REDUCE CALORIE INTAKE      Recommend to continue cutting back on starches to help aid in weight loss and better diabetes control.     Marland Kitchen  DIET - REDUCE SUGAR INTAKE      Recommend to cut back on sweet and sugars and focus on healthier alternatives such as fruits and vegetables when snacking.     .  Reduce portion size      Recommend eating 3 small meals a day and two healthy protein snacks in between.  Depression Screen PHQ 2/9 Scores 09/15/2020 08/15/2019  12/11/2018 08/10/2018 05/18/2017 05/16/2016 08/24/2015  PHQ - 2 Score 0 0 0 0 0 0 0    Fall Risk Fall Risk  09/15/2020 04/21/2020 08/15/2019 12/11/2018 08/10/2018  Falls in the past year? 0 0 0 0 0  Comment - Emmi Telephone Survey: data to providers prior to load - - -  Number falls in past yr: 0 - 0 - -  Injury with Fall? 0 - 0 - -    FALL RISK PREVENTION PERTAINING TO THE HOME:  Any stairs in or around the home? Yes  If so, are there any without handrails? No  Home free of loose throw rugs in walkways, pet beds, electrical cords, etc? Yes  Adequate lighting in your home to reduce risk of falls? Yes   ASSISTIVE DEVICES UTILIZED TO PREVENT FALLS:  Life alert? No  Use of a cane, walker or w/c? No  Grab bars in the bathroom? No  Shower chair or bench in shower? Yes  Elevated toilet seat or a handicapped toilet? Yes    Cognitive Function: Normal cognitive status assessed by observation by this Nurse Health Advisor. No abnormalities found.      6CIT Screen 08/10/2018  What Year? 0 points  What month? 0 points  What time? 0 points  Count back from 20 0 points  Months in reverse 0 points  Repeat phrase 0 points  Total Score 0    Immunizations Immunization History  Administered Date(s) Administered  . Fluad Quad(high Dose 65+) 07/17/2019  . Influenza, High Dose Seasonal PF 08/05/2016, 05/22/2017, 07/13/2018, 06/26/2020  . Influenza,inj,Quad PF,6+ Mos 09/11/2015  . Pneumococcal Conjugate-13 08/14/2014  . Pneumococcal Polysaccharide-23 08/02/2007, 10/22/2012  . Tdap 06/09/2010  . Zoster 10/22/2012    TDAP status: Due, Education has been provided regarding the importance of this vaccine. Advised may receive this vaccine at local pharmacy or Health Dept. Aware to provide a copy of the vaccination record if obtained from local pharmacy or Health Dept. Verbalized acceptance and understanding.  Flu Vaccine status: Up to date  Pneumococcal vaccine status: Up to date  Covid-19  vaccine status: Completed vaccines  Qualifies for Shingles Vaccine? Yes   Zostavax completed Yes   Shingrix Completed?: No.    Education has been provided regarding the importance of this vaccine. Patient has been advised to call insurance company to determine out of pocket expense if they have not yet received this vaccine. Advised may also receive vaccine at local pharmacy or Health Dept. Verbalized acceptance and understanding.  Screening Tests Health Maintenance  Topic Date Due  . COVID-19 Vaccine (1) Never done  . FOOT EXAM  01/23/2018  . TETANUS/TDAP  09/15/2021 (Originally 06/09/2020)  . HEMOGLOBIN A1C  11/18/2020  . OPHTHALMOLOGY EXAM  06/08/2021  . COLONOSCOPY  03/26/2025  . INFLUENZA VACCINE  Completed  . Hepatitis C Screening  Completed  . PNA vac Low Risk Adult  Completed    Health Maintenance  Health Maintenance Due  Topic Date Due  . COVID-19 Vaccine (1) Never done  . FOOT EXAM  01/23/2018    Colorectal cancer screening: Type of screening: Colonoscopy. Completed 03/26/20. Repeat every 5 years  Lung Cancer Screening: (Low Dose CT Chest recommended if Age 14-80 years, 30 pack-year currently smoking OR have quit w/in 15years.) does not qualify.   Additional Screening:  Hepatitis C Screening: Up to date  Vision Screening: Recommended annual ophthalmology exams for early detection of glaucoma and other disorders of the eye.  Is the patient up to date with their annual eye exam?  Yes  Who is the provider or what is the name of the office in which the patient attends annual eye exams? Dr Neville Route @ [place] If pt is not established with a provider, would they like to be referred to a provider to establish care? No .   Dental Screening: Recommended annual dental exams for proper oral hygiene  Community Resource Referral / Chronic Care Management: CRR required this visit?  No   CCM required this visit?  No      Plan:     I have personally reviewed and noted the  following in the patient's chart:   . Medical and social history . Use of alcohol, tobacco or illicit drugs  . Current medications and supplements . Functional ability and status . Nutritional status . Physical activity . Advanced directives . List of other physicians . Hospitalizations, surgeries, and ER visits in previous 12 months . Vitals . Screenings to include cognitive, depression, and falls . Referrals and appointments  In addition, I have reviewed and discussed with patient certain preventive protocols, quality metrics, and best practice recommendations. A written personalized care plan for preventive services as well as general preventive health recommendations were provided to patient.     Leandrea Ackley [place], [place]   [id number]   Nurse Notes: Pt needs a diabetic foot exam at next in office apt. Pt has received all of his Covid vaccines. Requested that he bring vaccine card to next in office apt to up date chart.

## 2020-09-15 ENCOUNTER — Other Ambulatory Visit: Payer: Self-pay

## 2020-09-15 ENCOUNTER — Ambulatory Visit (INDEPENDENT_AMBULATORY_CARE_PROVIDER_SITE_OTHER): Payer: PPO

## 2020-09-15 DIAGNOSIS — Z0000 Encounter for general adult medical examination without abnormal findings: Secondary | ICD-10-CM

## 2020-09-15 NOTE — Patient Instructions (Signed)
Mr. Andrew Lyons , Thank you for taking time to come for your Medicare Wellness Visit. I appreciate your ongoing commitment to your health goals. Please review the following plan we discussed and let me know if I can assist you in the future.   Screening recommendations/referrals: Colonoscopy: Up to date, due 03/2025 Recommended yearly ophthalmology/optometry visit for glaucoma screening and checkup Recommended yearly dental visit for hygiene and checkup  Vaccinations: Influenza vaccine: Done 06/2020 Pneumococcal vaccine: Completed series Tdap vaccine: Currently due, declined receiving.  Shingles vaccine: Shingrix discussed. Please contact your pharmacy for coverage information.     Advanced directives: Advance directive discussed with you today. Even though you declined this today please call our office should you change your mind and we can give you the proper paperwork for you to fill out.  Conditions/risks identified: Discussed diet modifications. Recommend to continue to cut back on portion sizes, sugars and starches to help with weight loss and better manage diabetes.   Next appointment: 09/17/20 @ 11:00 AM with [name]  Preventive Care 65 Years and Older, Male Preventive care refers to lifestyle choices and visits with your health care provider that can promote health and wellness. What does preventive care include?  A yearly physical exam. This is also called an annual well check.  Dental exams once or twice a year.  Routine eye exams. Ask your health care provider how often you should have your eyes checked.  Personal lifestyle choices, including:  Daily care of your teeth and gums.  Regular physical activity.  Eating a healthy diet.  Avoiding tobacco and drug use.  Limiting alcohol use.  Practicing safe sex.  Taking low doses of aspirin every day.  Taking vitamin and mineral supplements as recommended by your health care provider. What happens during an annual  well check? The services and screenings done by your health care provider during your annual well check will depend on your age, overall health, lifestyle risk factors, and family history of disease. Counseling  Your health care provider may ask you questions about your:  Alcohol use.  Tobacco use.  Drug use.  Emotional well-being.  Home and relationship well-being.  Sexual activity.  Eating habits.  History of falls.  Memory and ability to understand (cognition).  Work and work Statistician. Screening  You may have the following tests or measurements:  Height, weight, and BMI.  Blood pressure.  Lipid and cholesterol levels. These may be checked every 5 years, or more frequently if you are over 85 years old.  Skin check.  Lung cancer screening. You may have this screening every year starting at age 69 if you have a 30-pack-year history of smoking and currently smoke or have quit within the past 15 years.  Fecal occult blood test (FOBT) of the stool. You may have this test every year starting at age 86.  Flexible sigmoidoscopy or colonoscopy. You may have a sigmoidoscopy every 5 years or a colonoscopy every 10 years starting at age 49.  Prostate cancer screening. Recommendations will vary depending on your family history and other risks.  Hepatitis C blood test.  Hepatitis B blood test.  Sexually transmitted disease (STD) testing.  Diabetes screening. This is done by checking your blood sugar (glucose) after you have not eaten for a while (fasting). You may have this done every 1-3 years.  Abdominal aortic aneurysm (AAA) screening. You may need this if you are a current or former smoker.  Osteoporosis. You may be screened starting at age 9  if you are at high risk. Talk with your health care provider about your test results, treatment options, and if necessary, the need for more tests. Vaccines  Your health care provider may recommend certain vaccines, such  as:  Influenza vaccine. This is recommended every year.  Tetanus, diphtheria, and acellular pertussis (Tdap, Td) vaccine. You may need a Td booster every 10 years.  Zoster vaccine. You may need this after age 56.  Pneumococcal 13-valent conjugate (PCV13) vaccine. One dose is recommended after age 14.  Pneumococcal polysaccharide (PPSV23) vaccine. One dose is recommended after age 12. Talk to your health care provider about which screenings and vaccines you need and how often you need them. This information is not intended to replace advice given to you by your health care provider. Make sure you discuss any questions you have with your health care provider. Document Released: 10/09/2015 Document Revised: 06/01/2016 Document Reviewed: 07/14/2015 Elsevier Interactive Patient Education  2017 [employer] Prevention in the Home Falls can cause injuries. They can happen to people of all ages. There are many things you can do to make your home safe and to help prevent falls. What can I do on the outside of my home?  Regularly fix the edges of walkways and driveways and fix any cracks.  Remove anything that might make you trip as you walk through a door, such as a raised step or threshold.  Trim any bushes or trees on the path to your home.  Use bright outdoor lighting.  Clear any walking paths of anything that might make someone trip, such as rocks or tools.  Regularly check to see if handrails are loose or broken. Make sure that both sides of any steps have handrails.  Any raised decks and porches should have guardrails on the edges.  Have any leaves, snow, or ice cleared regularly.  Use sand or salt on walking paths during winter.  Clean up any spills in your garage right away. This includes oil or grease spills. What can I do in the bathroom?  Use night lights.  Install grab bars by the toilet and in the tub and shower. Do not use towel bars as grab bars.  Use  non-skid mats or decals in the tub or shower.  If you need to sit down in the shower, use a plastic, non-slip stool.  Keep the floor dry. Clean up any water that spills on the floor as soon as it happens.  Remove soap buildup in the tub or shower regularly.  Attach bath mats securely with double-sided non-slip rug tape.  Do not have throw rugs and other things on the floor that can make you trip. What can I do in the bedroom?  Use night lights.  Make sure that you have a light by your bed that is easy to reach.  Do not use any sheets or blankets that are too big for your bed. They should not hang down onto the floor.  Have a firm chair that has side arms. You can use this for support while you get dressed.  Do not have throw rugs and other things on the floor that can make you trip. What can I do in the kitchen?  Clean up any spills right away.  Avoid walking on wet floors.  Keep items that you use a lot in easy-to-reach places.  If you need to reach something above you, use a strong step stool that has a grab bar.  Keep electrical  cords out of the way.  Do not use floor polish or wax that makes floors slippery. If you must use wax, use non-skid floor wax.  Do not have throw rugs and other things on the floor that can make you trip. What can I do with my stairs?  Do not leave any items on the stairs.  Make sure that there are handrails on both sides of the stairs and use them. Fix handrails that are broken or loose. Make sure that handrails are as long as the stairways.  Check any carpeting to make sure that it is firmly attached to the stairs. Fix any carpet that is loose or worn.  Avoid having throw rugs at the top or bottom of the stairs. If you do have throw rugs, attach them to the floor with carpet tape.  Make sure that you have a light switch at the top of the stairs and the bottom of the stairs. If you do not have them, ask someone to add them for you. What  else can I do to help prevent falls?  Wear shoes that:  Do not have high heels.  Have rubber bottoms.  Are comfortable and fit you well.  Are closed at the toe. Do not wear sandals.  If you use a stepladder:  Make sure that it is fully opened. Do not climb a closed stepladder.  Make sure that both sides of the stepladder are locked into place.  Ask someone to hold it for you, if possible.  Clearly mark and make sure that you can see:  Any grab bars or handrails.  First and last steps.  Where the edge of each step is.  Use tools that help you move around (mobility aids) if they are needed. These include:  Canes.  Walkers.  Scooters.  Crutches.  Turn on the lights when you go into a dark area. Replace any light bulbs as soon as they burn out.  Set up your furniture so you have a clear path. Avoid moving your furniture around.  If any of your floors are uneven, fix them.  If there are any pets around you, be aware of where they are.  Review your medicines with your doctor. Some medicines can make you feel dizzy. This can increase your chance of falling. Ask your doctor what other things that you can do to help prevent falls. This information is not intended to replace advice given to you by your health care provider. Make sure you discuss any questions you have with your health care provider. Document Released: 07/09/2009 Document Revised: 02/18/2016 Document Reviewed: 10/17/2014 Elsevier Interactive Patient Education  2017 [employer].

## 2020-09-16 NOTE — Progress Notes (Signed)
Established patient visit   Patient: Andrew Lyons   DOB: [birth year]   74 y.o. Male  MRN: [id number] Visit Date: 09/17/2020  Today's healthcare provider: [name], MD   Chief Complaint  Patient presents with   Diabetes   Hypertension   Hyperlipidemia   Subjective    HPI  Patient is doing fairly well.  Pressures been good at home. He is taking his medication as prescribed. 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 Diabetes Mellitus Type II, Follow-up  Lab Results  Component Value Date   HGBA1C 7.2 (A) 09/17/2020   HGBA1C 6.8 (A) 05/18/2020   HGBA1C 6.7 (H) 01/17/2020   Wt Readings from Last 3 Encounters:  09/17/20 261 lb (118.4 kg)  08/11/20 259 lb 3.2 oz (117.6 kg)  05/18/20 259 lb (117.5 kg)   Last seen for diabetes 4 months ago.  Management since then includes no changes. He reports good compliance with treatment. He is not having side effects.  Symptoms: No fatigue No foot ulcerations  No appetite changes No nausea  No paresthesia of the feet  No polydipsia  No polyuria No visual disturbances   No vomiting     Home blood sugar records: trend: stable  Episodes of hypoglycemia? No    Current insulin regiment: none Most Recent Eye Exam: 05/2020 Current exercise: none Current diet habits: well balanced  Hypertension, follow-up  BP Readings from Last 3 Encounters:  09/17/20 131/66  08/11/20 (!) 149/68  05/18/20 (!) 146/84   Wt Readings from Last 3 Encounters:  09/17/20 261 lb (118.4 kg)  08/11/20 259 lb 3.2 oz (117.6 kg)  05/18/20 259 lb (117.5 kg)     He was last seen for hypertension 4 months ago.  BP at that visit was 146/84. Management since that visit includes no changes.  He reports good compliance with treatment. He is not having side effects.  He is following a Regular diet. He is not exercising. He does not smoke.  Use of agents associated with hypertension: none.   Outside blood pressures are checked  daily. Symptoms: No chest pain No chest pressure  No palpitations No syncope  No dyspnea No orthopnea  No paroxysmal nocturnal dyspnea No lower extremity edema    Lipid/Cholesterol, Follow-up  Last lipid panel Other pertinent labs  Lab Results  Component Value Date   CHOL 130 01/17/2020   HDL 35 (L) 01/17/2020   LDLCALC 68 01/17/2020   TRIG 156 (H) 01/17/2020   CHOLHDL 3.7 01/17/2020   Lab Results  Component Value Date   ALT 7 01/17/2020   AST 16 01/17/2020   PLT 169 01/17/2020   TSH 1.870 01/17/2020     He was last seen for this 4 months ago.  Management since that visit includes no changes.  He reports good compliance with treatment. He is not having side effects.        Medications: Outpatient Medications Prior to Visit  Medication Sig   amLODipine-benazepril (LOTREL) 5-40 MG capsule TAKE 1 CAPSULE BY MOUTH EVERY DAY (Patient taking differently: Take 1 capsule by mouth daily.)   cholecalciferol (VITAMIN D) 25 MCG (1000 UT) tablet Take 1,000 Units by mouth daily.    clopidogrel (PLAVIX) 75 MG tablet Take 1 tablet (75 mg total) by mouth daily.   Coenzyme Q10 100 MG TABS Take 100 mg by mouth daily.    fluticasone (FLONASE) 50 MCG/ACT nasal spray SPRAY 2 SPRAYS INTO EACH NOSTRIL EVERY DAY   JARDIANCE  25 MG TABS tablet TAKE 1 TABLET BY MOUTH ONCE DAILY   liraglutide (VICTOZA) 18 MG/3ML SOPN Inject 0.3 mLs (1.8 mg total) into the skin daily.   metFORMIN (GLUCOPHAGE) 500 MG tablet TAKE 1 TABLET (500 MG TOTAL) BY MOUTH 2 (TWO) TIMES DAILY WITH A MEAL.   metoprolol succinate (TOPROL-XL) 100 MG 24 hr tablet TAKE 1 TABLET BY MOUTH ONCE A DAY WITH OR IMMEDIATELY FOLLOWING A MEAL   montelukast (SINGULAIR) 10 MG tablet TAKE 1 TABLET BY MOUTH EVERY DAY (Patient taking differently: Take 10 mg by mouth daily.)   omeprazole (PRILOSEC) 40 MG capsule TAKE 1 CAPSULE BY MOUTH EVERY DAY (Patient taking differently: Take 40 mg by mouth daily.)   rosuvastatin (CRESTOR) 20 MG  tablet TAKE 1 TABLET BY MOUTH ONCE DAILY   sildenafil (VIAGRA) 100 MG tablet Take 50 mg by mouth daily as needed for erectile dysfunction.   vitamin B-12 (CYANOCOBALAMIN) 1000 MCG tablet Take 1,000 mcg by mouth daily.   B-D UF III MINI PEN NEEDLES 31G X 5 MM MISC USE WITH PEN DAILY (Patient not taking: No sig reported)   Blood Glucose Monitoring Suppl (ONE TOUCH ULTRA SYSTEM KIT) w/Device KIT To use daily to check sugar. DX E11.9-needs one touch ultra meter (Patient not taking: No sig reported)   ibuprofen (ADVIL) 800 MG tablet Take 1 tablet (800 mg total) by mouth every 8 (eight) hours as needed for mild pain or moderate pain. (Patient not taking: No sig reported)   Lancets (ONETOUCH ULTRASOFT) lancets USE 1  TO CHECK GLUCOSE THREE TIMES DAILY (Patient not taking: No sig reported)   ONETOUCH ULTRA test strip USE 1 STRIP TO CHECK GLUCOSE TWICE DAILY AS NEEDED (Patient not taking: No sig reported)   No facility-administered medications prior to visit.    Review of Systems  Constitutional: Negative for appetite change, chills and fever.  Respiratory: Negative for chest tightness, shortness of breath and wheezing.   Cardiovascular: Negative for chest pain and palpitations.  Gastrointestinal: Negative for abdominal pain, nausea and vomiting.    Last hemoglobin A1c Lab Results  Component Value Date   HGBA1C 7.2 (A) 09/17/2020      Objective    BP 131/66    Pulse 72    Resp 16    Wt 261 lb (118.4 kg)    BMI 37.45 kg/m  BP Readings from Last 3 Encounters:  09/17/20 131/66  08/11/20 (!) 149/68  05/18/20 (!) 146/84   Wt Readings from Last 3 Encounters:  09/17/20 261 lb (118.4 kg)  08/11/20 259 lb 3.2 oz (117.6 kg)  05/18/20 259 lb (117.5 kg)      Physical Exam Vitals reviewed.  Constitutional:      Appearance: He is well-developed. He is obese.  HENT:     Head: Normocephalic and atraumatic.     Right Ear: External ear normal.     Left Ear: External ear normal.     Nose:  Nose normal.  Eyes:     General: No scleral icterus.    Conjunctiva/sclera: Conjunctivae normal.  Neck:     Thyroid: No thyromegaly.  Cardiovascular:     Rate and Rhythm: Normal rate and regular rhythm.     Heart sounds: Normal heart sounds.  Pulmonary:     Effort: Pulmonary effort is normal.     Breath sounds: Normal breath sounds.  Abdominal:     Palpations: Abdomen is soft.  Musculoskeletal:     Comments: S/p Right BKA.    Skin:  General: Skin is warm and dry.  Neurological:     General: No focal deficit present.     Mental Status: He is alert and oriented to person, place, and time. Mental status is at baseline.  Psychiatric:        Mood and Affect: Mood normal.        Behavior: Behavior normal.        Thought Content: Thought content normal.        Judgment: Judgment normal.   Decreased sensation in the great toe of his left foot.   diabetic foot exam.   Results for orders placed or performed in visit on 09/17/20  POCT glycosylated hemoglobin (Hb A1C)  Result Value Ref Range   Hemoglobin A1C 7.2 (A) 4.0 - 5.6 %   HbA1c POC (<> result, manual entry)     HbA1c, POC (prediabetic range)     HbA1c, POC (controlled diabetic range)      Assessment & Plan     1. Type 2 diabetes mellitus with other circulatory complication, without long-term current use of insulin (HCC) A1c under good control at 7.2 on metformin, is 25, Victoza 1.8 mg weekly, - POCT glycosylated hemoglobin (Hb A1C)  2. Essential hypertension    3. Pure hypercholesterolemia On rosuvastatin 20  4. Hx of BKA, right ([place])   5. PAD (peripheral artery disease) ([facility])   6. Obstructive apnea On CPAP  7. 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   No follow-ups on file.         Kamia Insalaco [name], MD  [facility] [phone] (phone) [phone] (fax)  [place]

## 2020-09-17 ENCOUNTER — Ambulatory Visit (INDEPENDENT_AMBULATORY_CARE_PROVIDER_SITE_OTHER): Payer: PPO | Admitting: Family Medicine

## 2020-09-17 ENCOUNTER — Encounter: Payer: Self-pay | Admitting: Family Medicine

## 2020-09-17 ENCOUNTER — Other Ambulatory Visit: Payer: Self-pay

## 2020-09-17 VITALS — BP 131/66 | HR 72 | Resp 16 | Wt 261.0 lb

## 2020-09-17 DIAGNOSIS — E7800 Pure hypercholesterolemia, unspecified: Secondary | ICD-10-CM

## 2020-09-17 DIAGNOSIS — I739 Peripheral vascular disease, unspecified: Secondary | ICD-10-CM

## 2020-09-17 DIAGNOSIS — I10 Essential (primary) hypertension: Secondary | ICD-10-CM | POA: Diagnosis not present

## 2020-09-17 DIAGNOSIS — E1159 Type 2 diabetes mellitus with other circulatory complications: Secondary | ICD-10-CM | POA: Diagnosis not present

## 2020-09-17 DIAGNOSIS — Z89511 Acquired absence of right leg below knee: Secondary | ICD-10-CM

## 2020-09-17 DIAGNOSIS — G4733 Obstructive sleep apnea (adult) (pediatric): Secondary | ICD-10-CM

## 2020-09-17 DIAGNOSIS — K22710 Barrett's esophagus with low grade dysplasia: Secondary | ICD-10-CM | POA: Diagnosis not present

## 2020-09-17 LAB — POCT GLYCOSYLATED HEMOGLOBIN (HGB A1C): Hemoglobin A1C: 7.2 % — AB (ref 4.0–5.6)

## 2020-10-01 ENCOUNTER — Other Ambulatory Visit: Payer: Self-pay | Admitting: Family Medicine

## 2020-10-01 DIAGNOSIS — E118 Type 2 diabetes mellitus with unspecified complications: Secondary | ICD-10-CM

## 2020-10-01 DIAGNOSIS — I10 Essential (primary) hypertension: Secondary | ICD-10-CM

## 2020-10-01 DIAGNOSIS — J301 Allergic rhinitis due to pollen: Secondary | ICD-10-CM

## 2020-10-21 ENCOUNTER — Other Ambulatory Visit: Payer: Self-pay

## 2020-10-21 ENCOUNTER — Ambulatory Visit: Payer: PPO | Admitting: Dermatology

## 2020-10-21 ENCOUNTER — Encounter: Payer: Self-pay | Admitting: Dermatology

## 2020-10-21 DIAGNOSIS — D229 Melanocytic nevi, unspecified: Secondary | ICD-10-CM

## 2020-10-21 DIAGNOSIS — L820 Inflamed seborrheic keratosis: Secondary | ICD-10-CM

## 2020-10-21 DIAGNOSIS — L918 Other hypertrophic disorders of the skin: Secondary | ICD-10-CM | POA: Diagnosis not present

## 2020-10-21 DIAGNOSIS — D1800 Hemangioma unspecified site: Secondary | ICD-10-CM | POA: Diagnosis not present

## 2020-10-21 DIAGNOSIS — L578 Other skin changes due to chronic exposure to nonionizing radiation: Secondary | ICD-10-CM

## 2020-10-21 DIAGNOSIS — Z86018 Personal history of other benign neoplasm: Secondary | ICD-10-CM

## 2020-10-21 DIAGNOSIS — L821 Other seborrheic keratosis: Secondary | ICD-10-CM

## 2020-10-21 DIAGNOSIS — Z1283 Encounter for screening for malignant neoplasm of skin: Secondary | ICD-10-CM

## 2020-10-21 DIAGNOSIS — L814 Other melanin hyperpigmentation: Secondary | ICD-10-CM

## 2020-10-21 NOTE — Progress Notes (Signed)
Follow-Up Visit   Subjective  Andrew Lyons is a 75 y.o. male who presents for the following: Total body skin exam (1 yr f/u, hx of multiple dysplastic nevi) and growths (Neck, 44m, irritating). The patient presents for Total-Body Skin Exam (TBSE) for skin cancer screening and mole check.  The following portions of the chart were reviewed this encounter and updated as appropriate:   Tobacco  Allergies  Meds  Problems  Med Hx  Surg Hx  Fam Hx     Review of Systems:  No other skin or systemic complaints except as noted in HPI or Assessment and Plan.  Objective  Well appearing patient in no apparent distress; mood and affect are within normal limits.  A full examination was performed including scalp, head, eyes, ears, nose, lips, neck, chest, axillae, abdomen, back, buttocks, bilateral upper extremities, bilateral lower extremities, hands, feet, fingers, toes, fingernails, and toenails. All findings within normal limits unless otherwise noted below.  Objective  multiple: Scars with no evidence of recurrence.   Objective  Neck  x 16 (16): Erythematous keratotic or waxy stuck-on papule or plaque.   Objective  Neck x 6 (6): Fleshy, skin-colored pedunculated papules.     Assessment & Plan    Lentigines - Scattered tan macules - Discussed due to sun exposure - Benign, observe - Call for any changes  Seborrheic Keratoses - Stuck-on, waxy, tan-brown papules and plaques  - Discussed benign etiology and prognosis. - Observe - Call for any changes  Melanocytic Nevi - Tan-brown and/or pink-flesh-colored symmetric macules and papules - Benign appearing on exam today - Observation - Call clinic for new or changing moles - Recommend daily use of broad spectrum spf 30+ sunscreen to sun-exposed areas.   Hemangiomas - Red papules - Discussed benign nature - Observe - Call for any changes  Actinic Damage - Chronic, secondary to cumulative UV/sun exposure - diffuse  scaly erythematous macules with underlying dyspigmentation - Recommend daily broad spectrum sunscreen SPF 30+ to sun-exposed areas, reapply every 2 hours as needed.  - Call for new or changing lesions.  Skin cancer screening performed today.  Acrochordons (Skin Tags) - Fleshy, skin-colored pedunculated papules - Benign appearing.  - Observe. - If desired, they can be removed with an in office procedure that is not covered by insurance. - Please call the clinic if you notice any new or changing lesions. - Axilla, will plan txt on f/u  History of dysplastic nevus multiple  Clear. Observe for recurrence. Call clinic for new or changing lesions.  Recommend regular skin exams, daily broad-spectrum spf 30+ sunscreen use, and photoprotection.     Inflamed seborrheic keratosis (16) Neck  x 16  Destruction of lesion - Neck  x 16 Complexity: simple   Destruction method: cryotherapy   Informed consent: discussed and consent obtained   Timeout:  patient name, date of birth, surgical site, and procedure verified Lesion destroyed using liquid nitrogen: Yes   Region frozen until ice ball extended beyond lesion: Yes   Outcome: patient tolerated procedure well with no complications   Post-procedure details: wound care instructions given    Skin tag (6) Neck x 6  Irritated  Destruction of lesion - Neck x 6 Complexity: simple   Destruction method: cryotherapy   Informed consent: discussed and consent obtained   Timeout:  patient name, date of birth, surgical site, and procedure verified Lesion destroyed using liquid nitrogen: Yes   Region frozen until ice ball extended beyond lesion: Yes  Outcome: patient tolerated procedure well with no complications   Post-procedure details: wound care instructions given    Return in about 1 year (around 10/21/2021) for TBSE, Hx of Dysplastic nevi, pt can schedule for skin tag removal axilla.   I, Othelia Pulling, RMA, am acting as scribe for Sarina Ser, MD .  Documentation: I have reviewed the above documentation for accuracy and completeness, and I agree with the above.  Sarina Ser, MD

## 2020-10-23 ENCOUNTER — Encounter: Payer: Self-pay | Admitting: Dermatology

## 2020-10-29 ENCOUNTER — Other Ambulatory Visit: Payer: Self-pay | Admitting: Family Medicine

## 2020-10-29 DIAGNOSIS — E118 Type 2 diabetes mellitus with unspecified complications: Secondary | ICD-10-CM

## 2020-11-27 ENCOUNTER — Other Ambulatory Visit: Payer: Self-pay | Admitting: Family Medicine

## 2020-11-27 DIAGNOSIS — J301 Allergic rhinitis due to pollen: Secondary | ICD-10-CM

## 2020-12-26 ENCOUNTER — Other Ambulatory Visit: Payer: Self-pay | Admitting: Family Medicine

## 2020-12-26 DIAGNOSIS — J301 Allergic rhinitis due to pollen: Secondary | ICD-10-CM

## 2020-12-26 NOTE — Telephone Encounter (Signed)
Requested Prescriptions  Pending Prescriptions Disp Refills  . omeprazole (PRILOSEC) 40 MG capsule [Pharmacy Med Name: OMEPRAZOLE DR 40 MG CAP] 90 capsule 0    Sig: TAKE 1 CAPSULE BY MOUTH ONCE DAILY     Gastroenterology: Proton Pump Inhibitors Passed - 12/26/2020 11:49 AM      Passed - Valid encounter within last 12 months    Recent Outpatient Visits          3 months ago Type 2 diabetes mellitus with other circulatory complication, without long-term current use of insulin [facility])   [facility] [name]., MD   7 months ago Type 2 diabetes mellitus with other circulatory complication, without long-term current use of insulin [facility])   [facility] [name]., MD   11 months ago Annual physical exam   [facility] [name]., MD   1 year ago Type 2 diabetes mellitus with other circulatory complication, without long-term current use of insulin [facility])   [facility] [name]., MD   1 year ago Type 2 diabetes mellitus with other circulatory complication, without long-term current use of insulin [facility])   [facility] [name]., MD      Future Appointments            In 3 weeks [name]., MD [facility] [place]   In 1 month Ralene Bathe, MD [place]           . montelukast (SINGULAIR) 10 MG tablet [Pharmacy Med Name: MONTELUKAST SODIUM 10 MG TAB] 90 tablet 0    Sig: TAKE 1 TABLET BY MOUTH ONCE A DAY     Pulmonology:  Leukotriene Inhibitors Passed - 12/26/2020 11:49 AM      Passed - Valid encounter within last 12 months    Recent Outpatient Visits          3 months ago Type 2 diabetes mellitus with other circulatory complication, without long-term current use of insulin [facility])   [facility] [name]., MD   7 months ago Type 2 diabetes mellitus with other circulatory complication, without  long-term current use of insulin [facility])   [facility] [name]., MD   11 months ago Annual physical exam   [facility] [name]., MD   1 year ago Type 2 diabetes mellitus with other circulatory complication, without long-term current use of insulin [facility])   [facility] [name]., MD   1 year ago Type 2 diabetes mellitus with other circulatory complication, without long-term current use of insulin [facility])   [facility] [name]., MD      Future Appointments            In 3 weeks [name]., MD [facility] [place]   In 1 month Ralene Bathe, MD [place]

## 2021-01-18 ENCOUNTER — Other Ambulatory Visit: Payer: Self-pay

## 2021-01-18 ENCOUNTER — Encounter: Payer: Self-pay | Admitting: Family Medicine

## 2021-01-18 ENCOUNTER — Other Ambulatory Visit: Payer: Self-pay | Admitting: Family Medicine

## 2021-01-18 ENCOUNTER — Ambulatory Visit (INDEPENDENT_AMBULATORY_CARE_PROVIDER_SITE_OTHER): Payer: PPO | Admitting: Family Medicine

## 2021-01-18 VITALS — BP 126/69 | HR 73 | Temp 98.1°F | Resp 16 | Ht 70.0 in | Wt 263.0 lb

## 2021-01-18 DIAGNOSIS — E1159 Type 2 diabetes mellitus with other circulatory complications: Secondary | ICD-10-CM

## 2021-01-18 DIAGNOSIS — Z86010 Personal history of colonic polyps: Secondary | ICD-10-CM | POA: Diagnosis not present

## 2021-01-18 DIAGNOSIS — Q231 Congenital insufficiency of aortic valve: Secondary | ICD-10-CM

## 2021-01-18 DIAGNOSIS — I10 Essential (primary) hypertension: Secondary | ICD-10-CM | POA: Diagnosis not present

## 2021-01-18 DIAGNOSIS — N401 Enlarged prostate with lower urinary tract symptoms: Secondary | ICD-10-CM

## 2021-01-18 DIAGNOSIS — R3911 Hesitancy of micturition: Secondary | ICD-10-CM

## 2021-01-18 DIAGNOSIS — I2510 Atherosclerotic heart disease of native coronary artery without angina pectoris: Secondary | ICD-10-CM | POA: Diagnosis not present

## 2021-01-18 DIAGNOSIS — Z6837 Body mass index (BMI) 37.0-37.9, adult: Secondary | ICD-10-CM | POA: Diagnosis not present

## 2021-01-18 DIAGNOSIS — Z89511 Acquired absence of right leg below knee: Secondary | ICD-10-CM | POA: Diagnosis not present

## 2021-01-18 DIAGNOSIS — N529 Male erectile dysfunction, unspecified: Secondary | ICD-10-CM

## 2021-01-18 LAB — POCT GLYCOSYLATED HEMOGLOBIN (HGB A1C): Hemoglobin A1C: 6.7 % — AB (ref 4.0–5.6)

## 2021-01-18 NOTE — Telephone Encounter (Signed)
Requested medication (s) are due for refill today: yes  Requested medication (s) are on the active medication list: no  Last refill: 10/22/20  Future visit scheduled: yes  Notes to clinic:  d/c'd 03/26/20; pt has appt with [name] 01/18/21    Requested Prescriptions  Pending Prescriptions Disp Refills   tadalafil (CIALIS) 5 MG tablet [Pharmacy Med Name: TADALAFIL 5 MG TABLET] 90 tablet 4    Sig: TAKE ONE TABLET BY MOUTH DAILY AS NEEDED FOR ERECTILE DYSFUNCTION      Urology: Erectile Dysfunction Agents Passed - 01/18/2021  7:04 AM      Passed - Last BP in normal range    BP Readings from Last 1 Encounters:  09/17/20 131/66          Passed - Valid encounter within last 12 months    Recent Outpatient Visits           4 months ago Type 2 diabetes mellitus with other circulatory complication, without long-term current use of insulin ([place])   [facility] [name]., MD   8 months ago Type 2 diabetes mellitus with other circulatory complication, without long-term current use of insulin [facility])   [facility] [name]., MD   1 year ago Annual physical exam   [facility] [name]., MD   1 year ago Type 2 diabetes mellitus with other circulatory complication, without long-term current use of insulin [facility])   [facility] [name] MD   1 year ago Type 2 diabetes mellitus with other circulatory complication, without long-term current use of insulin [facility])   [facility] [name]., MD       Future Appointments             Today [name]., MD [facility], [place]   In 2 weeks Ralene Bathe, MD [place]

## 2021-01-18 NOTE — Progress Notes (Signed)
Established patient visit   Patient: Andrew Lyons   DOB: [birth year]   75 y.o. Male  MRN: [id number] Visit Date: 01/18/2021  Today's healthcare provider: [name], MD   Chief Complaint  Patient presents with  . Diabetes  . Hypertension   Subjective    HPI  Patient feels well and has no complaints.  He has taken his medications.  He is not doing much with diet and exercise.  Overall he feels well. Diabetes Mellitus Type II, follow-up  Lab Results  Component Value Date   HGBA1C 6.7 (A) 01/18/2021   HGBA1C 7.2 (A) 09/17/2020   HGBA1C 6.8 (A) 05/18/2020   Last seen for diabetes 4 months ago.  Management since then includes; A1c under good control at 7.2 on metformin, is 25, Victoza 1.8 mg weekly. He reports good compliance with treatment. He is not having side effects.   Home blood sugar records: not being checked.  Episodes of hypoglycemia? No    Current insulin regiment: Victoza 1.8mg   Most Recent Eye Exam: 06/08/2020   Hypertension, follow-up  BP Readings from Last 3 Encounters:  01/18/21 126/69  09/17/20 131/66  08/11/20 (!) 149/68   Wt Readings from Last 3 Encounters:  01/18/21 263 lb (119.3 kg)  09/17/20 261 lb (118.4 kg)  08/11/20 259 lb 3.2 oz (117.6 kg)     He was last seen for hypertension 4 months ago.  BP at that visit was 131/66. Management since that visit includes; on metoprolol and amlodipine-benazepril.  He reports good compliance with treatment. He is not having side effects.  He is not exercising. He is adherent to low salt diet.   Outside blood pressures are not being checked.  He does not smoke.  Use of agents associated with hypertension: none.   Obstructive apnea From 09/17/2020-On CPAP.      Medications: Outpatient Medications Prior to Visit  Medication Sig  . amLODipine-benazepril (LOTREL) 5-40 MG capsule TAKE 1 CAPSULE BY MOUTH ONCE DAILY  . B-D UF III MINI PEN NEEDLES 31G X 5 MM MISC USE WITH PEN DAILY  (Patient not taking: No sig reported)  . Blood Glucose Monitoring Suppl (ONE TOUCH ULTRA SYSTEM KIT) w/Device KIT To use daily to check sugar. DX E11.9-needs one touch ultra meter (Patient not taking: No sig reported)  . cholecalciferol (VITAMIN D) 25 MCG (1000 UT) tablet Take 1,000 Units by mouth daily.   . clopidogrel (PLAVIX) 75 MG tablet Take 1 tablet (75 mg total) by mouth daily.  . Coenzyme Q10 100 MG TABS Take 100 mg by mouth daily.   . fluticasone (FLONASE) 50 MCG/ACT nasal spray SPRAY 2 SPRAYS INTO EACH NOSTRIL EVERY DAY  . ibuprofen (ADVIL) 800 MG tablet Take 1 tablet (800 mg total) by mouth every 8 (eight) hours as needed for mild pain or moderate pain. (Patient not taking: No sig reported)  . JARDIANCE 25 MG TABS tablet TAKE 1 TABLET BY MOUTH ONCE DAILY  . Lancets (ONETOUCH ULTRASOFT) lancets USE 1  TO CHECK GLUCOSE THREE TIMES DAILY (Patient not taking: No sig reported)  . liraglutide (VICTOZA) 18 MG/3ML SOPN Inject 0.3 mLs (1.8 mg total) into the skin daily.  . metFORMIN (GLUCOPHAGE) 500 MG tablet TAKE 1 TABLET BY MOUTH TWICE A DAY WITH A MEAL  . metoprolol succinate (TOPROL-XL) 100 MG 24 hr tablet TAKE 1 TABLET BY MOUTH ONCE A DAY WITH OR IMMEDIATELY FOLLOWING A MEAL  . montelukast (SINGULAIR) 10 MG tablet TAKE 1 TABLET BY  MOUTH ONCE A DAY  . omeprazole (PRILOSEC) 40 MG capsule TAKE 1 CAPSULE BY MOUTH ONCE DAILY  . ONETOUCH ULTRA test strip USE 1 STRIP TO CHECK GLUCOSE TWICE DAILY AS NEEDED (Patient not taking: No sig reported)  . rosuvastatin (CRESTOR) 20 MG tablet TAKE 1 TABLET BY MOUTH ONCE DAILY  . sildenafil (VIAGRA) 100 MG tablet Take 50 mg by mouth daily as needed for erectile dysfunction.  . vitamin B-12 (CYANOCOBALAMIN) 1000 MCG tablet Take 1,000 mcg by mouth daily.   No facility-administered medications prior to visit.    Review of Systems  Constitutional: Negative for appetite change, chills and fever.  Respiratory: Negative for chest tightness, shortness of breath  and wheezing.   Cardiovascular: Negative for chest pain and palpitations.  Gastrointestinal: Negative for abdominal pain, nausea and vomiting.        Objective    BP 126/69   Pulse 73   Temp 98.1 F (36.7 C)   Resp 16   Ht $R'5\' 10"'uX$  (1.778 m)   Wt 263 lb (119.3 kg)   BMI 37.74 kg/m  BP Readings from Last 3 Encounters:  01/18/21 126/69  09/17/20 131/66  08/11/20 (!) 149/68   Wt Readings from Last 3 Encounters:  01/18/21 263 lb (119.3 kg)  09/17/20 261 lb (118.4 kg)  08/11/20 259 lb 3.2 oz (117.6 kg)       Physical Exam Vitals reviewed.  Constitutional:      Appearance: He is well-developed. He is obese.  HENT:     Head: Normocephalic and atraumatic.     Right Ear: External ear normal.     Left Ear: External ear normal.     Nose: Nose normal.  Eyes:     General: No scleral icterus.    Conjunctiva/sclera: Conjunctivae normal.  Neck:     Thyroid: No thyromegaly.  Cardiovascular:     Rate and Rhythm: Normal rate and regular rhythm.     Heart sounds: Normal heart sounds.  Pulmonary:     Effort: Pulmonary effort is normal.     Breath sounds: Normal breath sounds.  Abdominal:     Palpations: Abdomen is soft.  Musculoskeletal:     Comments: S/p Right BKA.    Skin:    General: Skin is warm and dry.  Neurological:     General: No focal deficit present.     Mental Status: He is alert and oriented to person, place, and time. Mental status is at baseline.  Psychiatric:        Mood and Affect: Mood normal.        Behavior: Behavior normal.        Thought Content: Thought content normal.        Judgment: Judgment normal.       Results for orders placed or performed in visit on 01/18/21  POCT glycosylated hemoglobin (Hb A1C)  Result Value Ref Range   Hemoglobin A1C 6.7 (A) 4.0 - 5.6 %   HbA1c POC (<> result, manual entry)     HbA1c, POC (prediabetic range)     HbA1c, POC (controlled diabetic range)      Assessment & Plan     1. Type 2 diabetes mellitus with  other circulatory complication, without long-term current use of insulin (HCC) Diabetes well controlled and improved with an A1c of 6.7 today.  Work on diet and exercise and I think it can improve even more. - POCT glycosylated hemoglobin (Hb A1C)  2. Aortic valve, bicuspid   3. Arteriosclerosis  of coronary artery All risk factors treated  4. Benign essential HTN Controlled on Lotrel and metoprolol  5. Class 2 severe obesity due to excess calories with serious comorbidity and body mass index (BMI) of 37.0 to 37.9 in adult [facility]) With heart disease diabetes hypertension and reflux  6. Hx of BKA, right ([place])   7. H/O adenomatous polyp of colon    No follow-ups on file.      I, [name], MD, have reviewed all documentation for this visit. The documentation on 01/23/21 for the exam, diagnosis, procedures, and orders are all accurate and complete.    Alajia Schmelzer [name], MD  [facility] [phone] (phone) [phone] (fax)  [place]

## 2021-01-20 ENCOUNTER — Other Ambulatory Visit: Payer: Self-pay | Admitting: Family Medicine

## 2021-01-20 DIAGNOSIS — E118 Type 2 diabetes mellitus with unspecified complications: Secondary | ICD-10-CM

## 2021-01-20 DIAGNOSIS — I10 Essential (primary) hypertension: Secondary | ICD-10-CM

## 2021-01-20 NOTE — Telephone Encounter (Signed)
Requested Prescriptions  Pending Prescriptions Disp Refills  . amLODipine-benazepril (LOTREL) 5-40 MG capsule [Pharmacy Med Name: AMLODIPINE-BENAZEPRIL 5-40 MG CAP] 90 capsule 0    Sig: TAKE 1 CAPSULE BY MOUTH ONCE DAILY     Cardiovascular: CCB + ACEI Combos Failed - 01/20/2021  9:16 AM      Failed - Cr in normal range and within 180 days    Creatinine, Ser  Date Value Ref Range Status  01/17/2020 1.13 0.76 - 1.27 mg/dL Final         Failed - K in normal range and within 180 days    Potassium  Date Value Ref Range Status  01/17/2020 4.5 3.5 - 5.2 mmol/L Final         Passed - Patient is not pregnant      Passed - Last BP in normal range    BP Readings from Last 1 Encounters:  01/18/21 126/69         Passed - Valid encounter within last 6 months    Recent Outpatient Visits          2 days ago Type 2 diabetes mellitus with other circulatory complication, without long-term current use of insulin [facility])   [facility] [name]., MD   4 months ago Type 2 diabetes mellitus with other circulatory complication, without long-term current use of insulin [facility])   [facility] [name]., MD   8 months ago Type 2 diabetes mellitus with other circulatory complication, without long-term current use of insulin [facility])   [facility] [name]., MD   1 year ago Annual physical exam   [facility] [name]., MD   1 year ago Type 2 diabetes mellitus with other circulatory complication, without long-term current use of insulin [facility])   [facility] [name]., MD      Future Appointments            In 1 week Ralene Bathe, MD [place]   In 4 months [name]., MD [facility]

## 2021-01-26 DIAGNOSIS — H0102B Squamous blepharitis left eye, upper and lower eyelids: Secondary | ICD-10-CM | POA: Diagnosis not present

## 2021-01-26 DIAGNOSIS — H0102A Squamous blepharitis right eye, upper and lower eyelids: Secondary | ICD-10-CM | POA: Diagnosis not present

## 2021-01-26 DIAGNOSIS — H35361 Drusen (degenerative) of macula, right eye: Secondary | ICD-10-CM | POA: Diagnosis not present

## 2021-01-26 DIAGNOSIS — H25813 Combined forms of age-related cataract, bilateral: Secondary | ICD-10-CM | POA: Diagnosis not present

## 2021-01-26 DIAGNOSIS — E119 Type 2 diabetes mellitus without complications: Secondary | ICD-10-CM | POA: Diagnosis not present

## 2021-01-27 ENCOUNTER — Other Ambulatory Visit: Payer: Self-pay

## 2021-01-27 ENCOUNTER — Ambulatory Visit (INDEPENDENT_AMBULATORY_CARE_PROVIDER_SITE_OTHER): Payer: PPO

## 2021-01-27 DIAGNOSIS — Z23 Encounter for immunization: Secondary | ICD-10-CM | POA: Diagnosis not present

## 2021-02-01 ENCOUNTER — Ambulatory Visit: Payer: PPO | Admitting: Dermatology

## 2021-02-01 ENCOUNTER — Other Ambulatory Visit: Payer: Self-pay

## 2021-02-01 DIAGNOSIS — L918 Other hypertrophic disorders of the skin: Secondary | ICD-10-CM

## 2021-02-01 DIAGNOSIS — L988 Other specified disorders of the skin and subcutaneous tissue: Secondary | ICD-10-CM

## 2021-02-01 NOTE — Patient Instructions (Addendum)
Skin Tag Removal Wound Care Instructions  . Sometimes bandages are not necessary.  If a bandage is used, leave the original bandage on for 24 hours if possible.  If the bandage becomes soaked or soiled before that time, it is OK to remove it and examine the wound.  A small amount of post-operative bleeding is normal.  If excessive bleeding occurs, remove the bandage, place gauze over the site and apply continuous pressure (no peeking) over the area for 20-30 minutes.  If this does not stop the bleeding, try again for 40 minutes.  If this does not work, please call our clinic as soon as possible (even if after hours).    . Once a day, cleanse the wound with soap and water.  If a thick crust develops you may use a Q-tip dipped into dilute hydrogen peroxide (mix 1:1 with water) to dissolve it.  Hydrogen peroxide can slow the healing process, so use it only as needed.  After washing, apply Vaseline jelly or Polysporin ointment.  For best healing, the wound should be covered with a layer of ointment at all times.  This may mean re-applying the ointment several times a day.  For open wounds, continue until it has healed.    . If you have any swelling, keep the area elevated.  . Some redness, tenderness and white or yellow material in the wound is normal healing.  If the area becomes very sore and red, or develops a thick yellow-green material (pus), it may be infected; please notify us.    . Wound healing continues for up to one year following surgery.  It is not unusual to experience pain in the scar from time to time during the interval.  If the pain becomes severe or the scar thickens, you should notify the office.  A slight amount of redness in a scar is expected for the first six months.  After six months, the redness subsides and the scar will soften and fade.  The color difference becomes less noticeable with time.  If there are any problems, return for a post-op surgery check at your earliest  convenience.  . It is important to wear sunscreen daily with an SPF of 30 or higher over the treated sites and to wear sun-protective clothing.  . Please call our office at [phone] for any questions or concerns.  If you have any questions or concerns for your doctor, please call our main line at [phone] and press option 4 to reach your doctor's medical assistant. If no one answers, please leave a voicemail as directed and we will return your call as soon as possible. Messages left after 4 pm will be answered the following business day.   You may also send [place] a message via [place]. We typically respond to MyChart messages within 1-2 business days.  For prescription refills, please ask your pharmacy to contact our office. Our fax number is [phone].  If you have an urgent issue when the clinic is closed that cannot wait until the next business day, you can page your doctor at the number below.    Please note that while we do our best to be available for urgent issues outside of office hours, we are not available 24/7.   If you have an urgent issue and are unable to reach [place], you may choose to seek medical care at your doctor's office, retail clinic, urgent care center, or emergency room.  If you have a medical emergency, please immediately  call 911 or go to the emergency department.  Pager Numbers  - Dr. [name]: [phone]  - Dr. Laurence Ferrari: [phone]  - Dr. Nicole Kindred: [phone]  In the event of inclement weather, please call our main line at [phone] for an update on the status of any delays or closures.  Dermatology Medication Tips: Please keep the boxes that topical medications come in in order to help keep track of the instructions about where and how to use these. Pharmacies typically print the medication instructions only on the boxes and not directly on the medication tubes.   If your medication is too expensive, please contact our office at [phone]  option 4 or send [place] a message through [place].   We are unable to tell what your co-pay for medications will be in advance as this is different depending on your insurance coverage. However, we may be able to find a substitute medication at lower cost or fill out paperwork to get insurance to cover a needed medication.   If a prior authorization is required to get your medication covered by your insurance company, please allow [place] 1-2 business days to complete this process.  Drug prices often vary depending on where the prescription is filled and some pharmacies may offer cheaper prices.  The website www.goodrx.com contains coupons for medications through different pharmacies. The prices here do not account for what the cost may be with help from insurance (it may be cheaper with your insurance), but the website can give you the price if you did not use any insurance.  - You can print the associated coupon and take it with your prescription to the pharmacy.  - You may also stop by our office during regular business hours and pick up a GoodRx coupon card.  - If you need your prescription sent electronically to a different pharmacy, notify our office through [facility] or by phone at [phone] option 4.  Cryotherapy Aftercare  . Wash gently with soap and water everyday.   Marland Kitchen Apply Vaseline and Band-Aid daily until healed.

## 2021-02-01 NOTE — Progress Notes (Signed)
   Follow-Up Visit   Subjective  Andrew Lyons is a 75 y.o. male who presents for the following: Follow-up (Patient here today for follow up on irritated skin tags he would like removed. Patient states he would like skin tags under left axilla. ).  The following portions of the chart were reviewed this encounter and updated as appropriate:  Tobacco  Allergies  Meds  Problems  Med Hx  Surg Hx  Fam Hx      Objective  Well appearing patient in no apparent distress; mood and affect are within normal limits.  A focused examination was performed including left axilla . Relevant physical exam findings are noted in the Assessment and Plan.  Objective  Left Axilla x 13 (28): Fleshy, skin-colored pedunculated papules.    Assessment & Plan  Skin tag (28) Left Axilla x 13  Irritated skin tags - patient would like removed   Procedure: Skin tag removal Informed consent:  Discussed risks (permanent scarring, infection, pain, bleeding, bruising, redness, and recurrence of the lesion) and benefits of the procedure, as well as the alternatives.  Patient is aware that skin tags are benign lesions, and their removal is often not considered medically necessary.  Informed consent was obtained. The area was prepared with isopropyl alcohol. Anesthesia:  lidocaine 1% with epinephrine was injected to achieve good local anesthesia Snip removal was performed.   Antibiotic ointment and a sterile dressing were applied.   The patient tolerated procedure well. The patient was instructed on post-op care.   Number of lesions removed:  15  Destruction of lesion - Left Axilla x 13 Complexity: simple   Destruction method: cryotherapy   Informed consent: discussed and consent obtained   Timeout:  patient name, date of birth, surgical site, and procedure verified Lesion destroyed using liquid nitrogen: Yes   Region frozen until ice ball extended beyond lesion: Yes   Outcome: patient tolerated procedure  well with no complications   Post-procedure details: wound care instructions given    Return if symptoms worsen or fail to improve, for follow up as scheduled.  IRuthell Rummage, CMA, am acting as scribe for Sarina Ser, MD.  Documentation: I have reviewed the above documentation for accuracy and completeness, and I agree with the above.  Sarina Ser, MD

## 2021-02-02 ENCOUNTER — Encounter: Payer: Self-pay | Admitting: Dermatology

## 2021-02-18 DIAGNOSIS — E782 Mixed hyperlipidemia: Secondary | ICD-10-CM | POA: Diagnosis not present

## 2021-02-18 DIAGNOSIS — I25118 Atherosclerotic heart disease of native coronary artery with other forms of angina pectoris: Secondary | ICD-10-CM | POA: Diagnosis not present

## 2021-02-18 DIAGNOSIS — I10 Essential (primary) hypertension: Secondary | ICD-10-CM | POA: Diagnosis not present

## 2021-02-18 DIAGNOSIS — I739 Peripheral vascular disease, unspecified: Secondary | ICD-10-CM | POA: Diagnosis not present

## 2021-02-18 DIAGNOSIS — I6523 Occlusion and stenosis of bilateral carotid arteries: Secondary | ICD-10-CM | POA: Diagnosis not present

## 2021-02-18 DIAGNOSIS — I2510 Atherosclerotic heart disease of native coronary artery without angina pectoris: Secondary | ICD-10-CM | POA: Diagnosis not present

## 2021-02-19 ENCOUNTER — Other Ambulatory Visit: Payer: Self-pay | Admitting: Family Medicine

## 2021-02-19 DIAGNOSIS — I10 Essential (primary) hypertension: Secondary | ICD-10-CM

## 2021-02-19 DIAGNOSIS — E118 Type 2 diabetes mellitus with unspecified complications: Secondary | ICD-10-CM

## 2021-02-19 DIAGNOSIS — I2510 Atherosclerotic heart disease of native coronary artery without angina pectoris: Secondary | ICD-10-CM

## 2021-02-19 NOTE — Telephone Encounter (Signed)
Future visit in 3 months. Last labs 01/17/20

## 2021-03-31 ENCOUNTER — Other Ambulatory Visit: Payer: Self-pay | Admitting: Family Medicine

## 2021-03-31 DIAGNOSIS — E1169 Type 2 diabetes mellitus with other specified complication: Secondary | ICD-10-CM | POA: Diagnosis not present

## 2021-03-31 DIAGNOSIS — I6523 Occlusion and stenosis of bilateral carotid arteries: Secondary | ICD-10-CM | POA: Diagnosis not present

## 2021-03-31 DIAGNOSIS — Z952 Presence of prosthetic heart valve: Secondary | ICD-10-CM | POA: Diagnosis not present

## 2021-03-31 DIAGNOSIS — R42 Dizziness and giddiness: Secondary | ICD-10-CM | POA: Diagnosis not present

## 2021-03-31 DIAGNOSIS — E782 Mixed hyperlipidemia: Secondary | ICD-10-CM | POA: Diagnosis not present

## 2021-03-31 DIAGNOSIS — I25118 Atherosclerotic heart disease of native coronary artery with other forms of angina pectoris: Secondary | ICD-10-CM | POA: Diagnosis not present

## 2021-03-31 DIAGNOSIS — G4733 Obstructive sleep apnea (adult) (pediatric): Secondary | ICD-10-CM | POA: Diagnosis not present

## 2021-03-31 DIAGNOSIS — Q231 Congenital insufficiency of aortic valve: Secondary | ICD-10-CM | POA: Diagnosis not present

## 2021-03-31 DIAGNOSIS — I10 Essential (primary) hypertension: Secondary | ICD-10-CM | POA: Diagnosis not present

## 2021-04-09 DIAGNOSIS — H25812 Combined forms of age-related cataract, left eye: Secondary | ICD-10-CM | POA: Diagnosis not present

## 2021-04-12 ENCOUNTER — Telehealth: Payer: Self-pay | Admitting: *Deleted

## 2021-04-12 NOTE — Chronic Care Management (AMB) (Signed)
  Chronic Care Management   Outreach Note  04/12/2021 Name: Andrew Lyons MRN: [id number] DOB: 06/21/1946  Andrew Lyons is a 75 y.o. year old male who is a primary care patient of [name]., MD. I reached out to Andrew Lyons by phone today in response to a referral sent by Mr. Andrew Lyons's PCP, Dr. Hinton Lovely telephone outreach was attempted today. The patient was referred to the case management team for assistance with care management and care coordination.   Follow Up Plan: The care management team will reach out to the patient again over the next 7 days.  If patient returns call to provider office, please advise to call [facility] at [phone].  [place] Management

## 2021-04-19 ENCOUNTER — Other Ambulatory Visit: Payer: Self-pay | Admitting: Family Medicine

## 2021-04-19 DIAGNOSIS — I10 Essential (primary) hypertension: Secondary | ICD-10-CM

## 2021-04-19 DIAGNOSIS — E118 Type 2 diabetes mellitus with unspecified complications: Secondary | ICD-10-CM

## 2021-04-19 DIAGNOSIS — E1159 Type 2 diabetes mellitus with other circulatory complications: Secondary | ICD-10-CM

## 2021-04-19 NOTE — Chronic Care Management (AMB) (Signed)
  Chronic Care Management   Note  04/19/2021 Name: Andrew Lyons MRN: [id number] DOB: 02-25-1946  Andrew Lyons is a 75 y.o. year old male who is a primary care patient of [name]., MD. I reached out to [name] by phone today in response to a referral sent by Andrew Lyons's PCP, Dr. Rosanna Randy      [name] was given information about Chronic Care Management services today including:  CCM service includes personalized support from designated clinical staff supervised by his physician, including individualized plan of care and coordination with other care providers 24/7 contact phone numbers for assistance for urgent and routine care needs. Service will only be billed when office clinical staff spend 20 minutes or more in a month to coordinate care. Only one practitioner may furnish and bill the service in a calendar month. The patient may stop CCM services at any time (effective at the end of the month) by phone call to the office staff. The patient will be responsible for cost sharing (co-pay) of up to 20% of the service fee (after annual deductible is met).  Patient did not agree to enrollment in care management services and does not wish to consider at this time.  Follow up plan: Patient declines engagement by the care management team. Appropriate care team members and provider have been notified via electronic communication. The care management team is available to follow up with the patient after provider conversation with the patient regarding recommendation for care management engagement and subsequent re-referral to the care management team.   [place] Management  Direct Dial: [phone]

## 2021-04-30 DIAGNOSIS — I25118 Atherosclerotic heart disease of native coronary artery with other forms of angina pectoris: Secondary | ICD-10-CM | POA: Diagnosis not present

## 2021-05-11 DIAGNOSIS — E782 Mixed hyperlipidemia: Secondary | ICD-10-CM | POA: Diagnosis not present

## 2021-05-11 DIAGNOSIS — Q231 Congenital insufficiency of aortic valve: Secondary | ICD-10-CM | POA: Diagnosis not present

## 2021-05-11 DIAGNOSIS — I10 Essential (primary) hypertension: Secondary | ICD-10-CM | POA: Diagnosis not present

## 2021-05-11 DIAGNOSIS — I6523 Occlusion and stenosis of bilateral carotid arteries: Secondary | ICD-10-CM | POA: Diagnosis not present

## 2021-05-11 DIAGNOSIS — I25118 Atherosclerotic heart disease of native coronary artery with other forms of angina pectoris: Secondary | ICD-10-CM | POA: Diagnosis not present

## 2021-05-19 ENCOUNTER — Other Ambulatory Visit: Payer: Self-pay | Admitting: Family Medicine

## 2021-05-19 DIAGNOSIS — I2510 Atherosclerotic heart disease of native coronary artery without angina pectoris: Secondary | ICD-10-CM

## 2021-05-19 DIAGNOSIS — E1159 Type 2 diabetes mellitus with other circulatory complications: Secondary | ICD-10-CM

## 2021-05-19 NOTE — Telephone Encounter (Signed)
Requested medication (s) are due for refill today:  yes  Requested medication (s) are on the active medication list: yes   Last refill: 02/19/2021  Future visit scheduled: yes   Notes to clinic:  Patient has appt on 06/08/2021  Failed protocol:  Total Cholesterol in normal range and within 360 days   LDL in normal range and within 360 days   HDL in normal range and within 360 days   Triglycerides in normal range and within 360 days   Patient is not pregnant     Requested Prescriptions  Pending Prescriptions Disp Refills   rosuvastatin (CRESTOR) 20 MG tablet [Pharmacy Med Name: ROSUVASTATIN CALCIUM 20 MG TAB] 90 tablet 0    Sig: TAKE 1 TABLET BY MOUTH ONCE A DAY     Cardiovascular:  Antilipid - Statins Failed - 05/19/2021 12:38 PM      Failed - Total Cholesterol in normal range and within 360 days    Cholesterol, Total  Date Value Ref Range Status  01/17/2020 130 100 - 199 mg/dL Final          Failed - LDL in normal range and within 360 days    LDL Cholesterol (Calc)  Date Value Ref Range Status  09/21/2017 71 mg/dL (calc) Final    Comment:    Reference range: <100 . Desirable range <100 mg/dL for primary prevention;   <70 mg/dL for patients with CHD or diabetic patients  with > or = 2 CHD risk factors. Marland Kitchen LDL-C is now calculated using the Martin-Hopkins  calculation, which is a validated novel method providing  better accuracy than the Friedewald equation in the  estimation of LDL-C.  Cresenciano Genre et al. Annamaria Helling. WG:[id number]): 2061-2068  (http://education.[employer].com/faq/FAQ164)    LDL Chol Calc (NIH)  Date Value Ref Range Status  01/17/2020 68 0 - 99 mg/dL Final          Failed - HDL in normal range and within 360 days    HDL  Date Value Ref Range Status  01/17/2020 35 (L) >39 mg/dL Final          Failed - Triglycerides in normal range and within 360 days    Triglycerides  Date Value Ref Range Status  01/17/2020 156 (H) 0 - 149 mg/dL Final           Passed - Patient is not pregnant      Passed - Valid encounter within last 12 months    Recent Outpatient Visits           4 months ago Type 2 diabetes mellitus with other circulatory complication, without long-term current use of insulin [facility])   [facility] [name]., MD   8 months ago Type 2 diabetes mellitus with other circulatory complication, without long-term current use of insulin [facility])   [facility] [name]., MD   1 year ago Type 2 diabetes mellitus with other circulatory complication, without long-term current use of insulin [facility])   [facility] [name]., MD   1 year ago Annual physical exam   [facility] [name]., MD   1 year ago Type 2 diabetes mellitus with other circulatory complication, without long-term current use of insulin [facility])   [facility] [name]., MD       Future Appointments             In 2 weeks [name]., MD  [employer], [place]

## 2021-05-19 NOTE — Telephone Encounter (Signed)
Please review. Thanks!  

## 2021-05-19 NOTE — Telephone Encounter (Signed)
Requested medication (s) are due for refill today: yes   Requested medication (s) are on the active medication list: yes   Last refill:  02/19/2021  Future visit scheduled: yes   Notes to clinic:  Failed protocol:  Evaluate AST, ALT within 2 months of therapy initiation.   ALT in normal range and within 360 days   AST in normal range and within 360 days   HCT in normal range and within 180 days   HGB in normal range and within 180 days   PLT in normal range and within 180 days     Requested Prescriptions  Pending Prescriptions Disp Refills   clopidogrel (PLAVIX) 75 MG tablet [Pharmacy Med Name: CLOPIDOGREL BISULFATE 75 MG TAB] 90 tablet 0    Sig: TAKE 1 TABLET BY MOUTH ONCE A DAY     Hematology: Antiplatelets - clopidogrel Failed - 05/19/2021 12:38 PM      Failed - Evaluate AST, ALT within 2 months of therapy initiation.      Failed - ALT in normal range and within 360 days    ALT  Date Value Ref Range Status  01/17/2020 7 0 - 44 IU/L Final          Failed - AST in normal range and within 360 days    AST  Date Value Ref Range Status  01/17/2020 16 0 - 40 IU/L Final          Failed - HCT in normal range and within 180 days    Hematocrit  Date Value Ref Range Status  01/17/2020 42.4 37.5 - 51.0 % Final          Failed - HGB in normal range and within 180 days    Hemoglobin  Date Value Ref Range Status  01/17/2020 14.3 13.0 - 17.7 g/dL Final          Failed - PLT in normal range and within 180 days    Platelets  Date Value Ref Range Status  01/17/2020 169 150 - 450 x10E3/uL Final          Passed - Valid encounter within last 6 months    Recent Outpatient Visits           4 months ago Type 2 diabetes mellitus with other circulatory complication, without long-term current use of insulin [facility])   [facility] [name]., MD   8 months ago Type 2 diabetes mellitus with other circulatory complication, without long-term current use of  insulin [facility])   [facility] [name]., MD   1 year ago Type 2 diabetes mellitus with other circulatory complication, without long-term current use of insulin [facility])   [facility] [name]., MD   1 year ago Annual physical exam   [facility] [name]., MD   1 year ago Type 2 diabetes mellitus with other circulatory complication, without long-term current use of insulin [facility])   [facility] [name]., MD       Future Appointments             In 2 weeks [name]., MD [facility] [place]

## 2021-05-26 ENCOUNTER — Other Ambulatory Visit: Payer: Self-pay | Admitting: Family Medicine

## 2021-05-26 DIAGNOSIS — E1159 Type 2 diabetes mellitus with other circulatory complications: Secondary | ICD-10-CM

## 2021-06-08 ENCOUNTER — Encounter: Payer: Self-pay | Admitting: Family Medicine

## 2021-06-08 ENCOUNTER — Other Ambulatory Visit: Payer: Self-pay

## 2021-06-08 ENCOUNTER — Ambulatory Visit (INDEPENDENT_AMBULATORY_CARE_PROVIDER_SITE_OTHER): Payer: PPO | Admitting: Family Medicine

## 2021-06-08 VITALS — BP 123/72 | HR 73 | Temp 98.2°F | Resp 18 | Ht 70.0 in | Wt 259.0 lb

## 2021-06-08 DIAGNOSIS — Z89511 Acquired absence of right leg below knee: Secondary | ICD-10-CM

## 2021-06-08 DIAGNOSIS — E7800 Pure hypercholesterolemia, unspecified: Secondary | ICD-10-CM

## 2021-06-08 DIAGNOSIS — Z23 Encounter for immunization: Secondary | ICD-10-CM | POA: Diagnosis not present

## 2021-06-08 DIAGNOSIS — K22710 Barrett's esophagus with low grade dysplasia: Secondary | ICD-10-CM

## 2021-06-08 DIAGNOSIS — Z6837 Body mass index (BMI) 37.0-37.9, adult: Secondary | ICD-10-CM | POA: Diagnosis not present

## 2021-06-08 DIAGNOSIS — I2510 Atherosclerotic heart disease of native coronary artery without angina pectoris: Secondary | ICD-10-CM

## 2021-06-08 DIAGNOSIS — E1159 Type 2 diabetes mellitus with other circulatory complications: Secondary | ICD-10-CM

## 2021-06-08 DIAGNOSIS — Z0000 Encounter for general adult medical examination without abnormal findings: Secondary | ICD-10-CM

## 2021-06-08 DIAGNOSIS — I10 Essential (primary) hypertension: Secondary | ICD-10-CM | POA: Diagnosis not present

## 2021-06-08 DIAGNOSIS — G4733 Obstructive sleep apnea (adult) (pediatric): Secondary | ICD-10-CM

## 2021-06-08 NOTE — Progress Notes (Signed)
I,[name],acting as a scribe for Andrew Durie, MD.,have documented all relevant documentation on the behalf of Andrew Durie, MD,as directed by  Andrew Durie, MD while in the presence of Andrew Durie, MD.   Complete physical exam   Patient: Andrew Lyons   DOB: 1945-11-13   75 y.o. Male  MRN: [id number] Visit Date: 06/08/2021  Today's healthcare provider: [name], MD   Chief Complaint  Patient presents with   Annual Exam   Subjective    Andrew Lyons is a 75 y.o. male who presents today for a complete physical exam.  He reports consuming a general diet. The patient does not participate in regular exercise at present. He generally feels fairly well. He reports sleeping fairly well. He does not have additional problems to discuss today.  Patient gets no exercise. HPI  Patient had AWV with [facility] on 09/15/2020.  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    Barrett's esophagus with dysplasia    Cataract    COPD (chronic obstructive pulmonary disease) (HCC)    Coronary artery disease    Diabetes mellitus without complication (HCC)    GERD (gastroesophageal reflux disease)    Hepatitis    Hx of dysplastic nevus 10/19/2006   L upper back, medial to mid scapula, slight to moderate atypia   Hx of dysplastic nevus 03/18/2008   R upper back 4.0cm lat to spine, moderate to marked atypia, excised 04/15/2008   Hx of dysplastic nevus 09/22/2008   R mid back 5.0cm lat to spine, moderate atypia   Hx of dysplastic nevus 04/06/2009   L mid to low back 11.0cm lat to spine, mild atypia   Hyperlipidemia    Hypertension    Myocardial infarction ([place])    Seizures ([place])    Sleep apnea    Past Surgical History:  Procedure Laterality Date   ABDOMINAL AORTOGRAM W/LOWER EXTREMITY N/A 06/20/2018   Procedure: ABDOMINAL AORTOGRAM W/LOWER EXTREMITY;  Surgeon: Wellington Hampshire, MD;  Location: [facility];  Service: Cardiovascular;  Laterality: N/A;   AMPUTATION Right 10/27/2018   Procedure: AMPUTATION BELOW KNEE;  Surgeon: Leim Fabry, MD;  Location: [facility] ORS;  Service: Orthopedics;  Laterality: Right;   CARDIAC CATHETERIZATION     with Angioplasty   cardiac stents     CARDIAC VALVE REPLACEMENT     Aortic Valve Replacement   COLONOSCOPY N/A 03/26/2020   Procedure: COLONOSCOPY;  Surgeon: Benjamine Sprague, DO;  Location: [facility];  Service: General;  Laterality: N/A;   COLONOSCOPY WITH PROPOFOL N/A 03/18/2015   Procedure: COLONOSCOPY WITH PROPOFOL;  Surgeon: [name], MD;  Location: [facility] ENDOSCOPY;  Service: Endoscopy;  Laterality: N/A;   ESOPHAGOGASTRODUODENOSCOPY N/A 03/18/2015   Procedure: ESOPHAGOGASTRODUODENOSCOPY (EGD);  Surgeon: [name], MD;  Location: [facility] ENDOSCOPY;  Service: Endoscopy;  Laterality: N/A;   FOOT AMPUTATION     GREEN LIGHT LASER TURP (TRANSURETHRAL RESECTION OF PROSTATE N/A 10/16/2018   Procedure: GREEN LIGHT LASER TURP (TRANSURETHRAL RESECTION OF PROSTATE;  Surgeon: Royston Cowper, MD;  Location: [facility] ORS;  Service: Urology;  Laterality: N/A;   HARDWARE REMOVAL Right 10/27/2018   Procedure: HARDWARE REMOVAL;  Surgeon: Leim Fabry, MD;  Location: [facility] OR[facility];  Service: Orthopedics;  Laterality: Right;   HEMORRHOID SURGERY N/A 03/26/2020   Procedure: HEMORRHOIDECTOMY;  Surgeon: Benjamine Sprague, DO;  Location: [facility] ORS;  Service: General;  Laterality: N/A;   LOWER EXTREMITY  ANGIOGRAPHY Right 10/25/2018   Procedure: Lower Extremity Angiography;  Surgeon: Algernon Huxley, MD;  Location: [place] [facility];  Service: Cardiovascular;  Laterality: Right;   PERIPHERAL VASCULAR INTERVENTION Right 06/20/2018   Procedure: PERIPHERAL VASCULAR INTERVENTION;  Surgeon: Wellington Hampshire, MD;  Location: [place] [facility];  Service: Cardiovascular;  Laterality: Right;   THORACOTOMY Right    TONSILLECTOMY     Social History   Socioeconomic History   Marital status: Married     Spouse name: Not on file   Number of children: 2   Years of education: Not on file   Highest education level: 8th grade  Occupational History   Occupation: retired  Tobacco Use   Smoking status: Former    Types: Cigarettes    Quit date: 10/02/2001    Years since quitting: 19.6   Smokeless tobacco: Former   Tobacco comments:    Quit smoking in 2003; Started smoking at age 6, smoked about 40 years, smoked over 3 packs per day  Vaping Use   Vaping Use: Never used  Substance and Sexual Activity   Alcohol use: Not Currently   Drug use: No   Sexual activity: Not on file  Other Topics Concern   Not on file  Social History Narrative   Not on file   Social Determinants of Health   Financial Resource Strain: Low Risk    Difficulty of Paying Living Expenses: Not hard at all  Food Insecurity: No Food Insecurity   Worried About Charity fundraiser in the Last Year: Never true   [place] in the Last Year: Never true  Transportation Needs: No Transportation Needs   Lack of Transportation (Medical): No   Lack of Transportation (Non-Medical): No  Physical Activity: Inactive   Days of Exercise per Week: 0 days   Minutes of Exercise per Session: 0 min  Stress: No Stress Concern Present   Feeling of Stress : Not at all  Social Connections: Moderately Isolated   Frequency of Communication with Friends and Family: More than three times a week   Frequency of Social Gatherings with Friends and Family: More than three times a week   Attends Religious Services: Never   Marine scientist or Organizations: No   Attends Music therapist: Never   Marital Status: Married  Human resources officer Violence: Not At Risk   Fear of Current or Ex-Partner: No   Emotionally Abused: No   Physically Abused: No   Sexually Abused: No   Family Status  Relation Name Status   Son  Alive   Brother #2 Alive   Mother  Deceased at age 52       died from an MI   Father  Deceased at age 52        died from an MI;   Sister  44   Brother #1 Deceased at age 39       cause of death was complications after a bypass surgery   Daughter  Alive   Family History  Problem Relation Age of Onset   Obesity Son    Diabetes Brother    Hypertension Brother    Heart attack Mother    Heart attack Father    Allergies  Allergen Reactions   Iodinated Diagnostic Agents Rash    had a red chest -unusre of what kind of dye it was   Zyban [Bupropion] Rash   Zocor [Simvastatin] Other (See Comments)  Reaction: hepatitis    Gadolinium Derivatives Rash    Patient Care Team: [name]., MD as PCP - General (Family Medicine) Corey Skains, MD as Consulting Physician (Cardiology) Lucky Cowboy Erskine Squibb, MD as Referring Physician (Vascular Surgery) Ralene Bathe, MD (Dermatology) Earnestine Leys, MD (Orthopedic Surgery) Benjamine Sprague, DO as Consulting Physician (Surgery) Marchia Meiers, MD as Consulting Physician (Ophthalmology)   Medications: Outpatient Medications Prior to Visit  Medication Sig   amLODipine-benazepril (LOTREL) 5-40 MG capsule TAKE 1 CAPSULE BY MOUTH ONCE DAILY   B-D UF III MINI PEN NEEDLES 31G X 5 MM MISC USE WITH PEN DAILY   Blood Glucose Monitoring Suppl (ONE TOUCH ULTRA SYSTEM KIT) w/Device KIT To use daily to check sugar. DX E11.9-needs one touch ultra meter   cholecalciferol (VITAMIN D) 25 MCG (1000 UT) tablet Take 1,000 Units by mouth daily.    clopidogrel (PLAVIX) 75 MG tablet TAKE 1 TABLET BY MOUTH ONCE A DAY   Coenzyme Q10 100 MG TABS Take 100 mg by mouth daily.    fluticasone (FLONASE) 50 MCG/ACT nasal spray SPRAY 2 SPRAYS INTO EACH NOSTRIL EVERY DAY   ibuprofen (ADVIL) 800 MG tablet Take 1 tablet (800 mg total) by mouth every 8 (eight) hours as needed for mild pain or moderate pain.   JARDIANCE 25 MG TABS tablet TAKE 1 TABLET BY MOUTH ONCE DAILY   Lancets (ONETOUCH ULTRASOFT) lancets USE 1  TO CHECK GLUCOSE THREE TIMES DAILY   metFORMIN (GLUCOPHAGE) 500  MG tablet TAKE 1 TABLET BY MOUTH TWICE A DAY WITH A MEAL   metoprolol succinate (TOPROL-XL) 100 MG 24 hr tablet TAKE 1 TABLET BY MOUTH ONCE A DAY WITH OR IMMEDIATELY FOLLOWING A MEAL   montelukast (SINGULAIR) 10 MG tablet TAKE 1 TABLET BY MOUTH ONCE A DAY   omeprazole (PRILOSEC) 40 MG capsule TAKE 1 CAPSULE BY MOUTH ONCE DAILY   ONETOUCH ULTRA test strip USE 1 STRIP TO CHECK GLUCOSE TWICE DAILY AS NEEDED   rosuvastatin (CRESTOR) 20 MG tablet TAKE 1 TABLET BY MOUTH ONCE A DAY   sildenafil (VIAGRA) 100 MG tablet Take 50 mg by mouth daily as needed for erectile dysfunction.   tadalafil (CIALIS) 5 MG tablet TAKE ONE TABLET BY MOUTH DAILY AS NEEDED FOR ERECTILE DYSFUNCTION   VICTOZA 18 MG/3ML SOPN INJECT 0.3 MLS (1.8 MG) UNDER THE SKIN ONCE DAILY   vitamin B-12 (CYANOCOBALAMIN) 1000 MCG tablet Take 1,000 mcg by mouth daily.   No facility-administered medications prior to visit.    Review of Systems  All other systems reviewed and are negative.     Objective    BP 123/72 (BP Location: Left Arm, Patient Position: Sitting, Cuff Size: Large)   Pulse 73   Temp 98.2 F (36.8 C) (Oral)   Resp 18   Ht 5' 10" (1.778 m)   Wt 259 lb (117.5 kg)   SpO2 96%   BMI 37.16 kg/m  BP Readings from Last 3 Encounters:  06/08/21 123/72  01/18/21 126/69  09/17/20 131/66   Wt Readings from Last 3 Encounters:  06/08/21 259 lb (117.5 kg)  01/18/21 263 lb (119.3 kg)  09/17/20 261 lb (118.4 kg)      Physical Exam Vitals reviewed.  Constitutional:      Appearance: He is well-developed. He is obese.  HENT:     Head: Normocephalic and atraumatic.     Right Ear: External ear normal.     Left Ear: External ear normal.     Nose: Nose  normal.  Eyes:     General: No scleral icterus.    Conjunctiva/sclera: Conjunctivae normal.  Neck:     Thyroid: No thyromegaly.  Cardiovascular:     Rate and Rhythm: Normal rate and regular rhythm.     Heart sounds: Normal heart sounds.  Pulmonary:     Effort:  Pulmonary effort is normal.     Breath sounds: Normal breath sounds.  Abdominal:     Palpations: Abdomen is soft.  Genitourinary:    Penis: Normal.      Testes: Normal.  Musculoskeletal:     Comments: S/p Right BKA.  Skin:    General: Skin is warm and dry.  Neurological:     General: No focal deficit present.     Mental Status: He is alert and oriented to person, place, and time.     Comments: He has what appears to be a rest tremor of his right hand.  He states this does not bother him and he has no other complaints  Psychiatric:        Mood and Affect: Mood normal.        Behavior: Behavior normal.        Thought Content: Thought content normal.        Judgment: Judgment normal.      Last depression screening scores PHQ 2/9 Scores 06/08/2021 09/15/2020 08/15/2019  PHQ - 2 Score 0 0 0  PHQ- 9 Score 0 - -   Last fall risk screening Fall Risk  06/08/2021  Falls in the past year? 0  Comment -  Number falls in past yr: 0  Injury with Fall? 0  Risk for fall due to : Impaired balance/gait;Orthopedic patient  Follow up Falls evaluation completed   Last Audit-C alcohol use screening Alcohol Use Disorder Test (AUDIT) 06/08/2021  1. How often do you have a drink containing alcohol? 1  2. How many drinks containing alcohol do you have on a typical day when you are drinking? 0  3. How often do you have six or more drinks on one occasion? 0  AUDIT-C Score 1  Alcohol Brief Interventions/Follow-up -   A score of 3 or more in women, and 4 or more in men indicates increased risk for alcohol abuse, EXCEPT if all of the points are from question 1   No results found for any visits on 06/08/21.  Assessment & Plan    Routine Health Maintenance and Physical Exam  Exercise Activities and Dietary recommendations  Goals       DIET - REDUCE CALORIE INTAKE      Recommend to continue cutting back on starches to help aid in weight loss and better diabetes control.       DIET - REDUCE SUGAR  INTAKE      Recommend to cut back on sweet and sugars and focus on healthier alternatives such as fruits and vegetables when snacking.       I would like to come off of my Prilosec (pt-stated)      Current Barriers:  Pill burden  Pharmacist Clinical Goal(s):  Over the next 30 days, patient and pharmacist will work together to develop a taper to come off of omeprazole safely and with minimal side effects as reported by patient.   Interventions: Reviewed use, side effects, and adverse events of omeprazole Developed taper for DC omeprazole; approved taper with Dr. Rosanna Randy  Patient Self Care Activities:  Self administers medications as prescribed  Initial goal documentation  Medication assistance (pt-stated)      Current Barriers:  financial  Pharmacist Clinical Goal(s): Over the next 14 days, Mr.. Bickford will provide the necessary supplementary documents (proof of out of pocket prescription expenditure, proof of household income) needed for medication assistance applications to [facility] pharmacist.   Interventions: [facility] pharmacist will apply for medication assistance program for Victoza made by [employer], and Jardiance made by [employer].    Patient Self Care Activities:  Gather necessary documents needed to apply for medication assistance  Initial goal documentation        Reduce portion size      Recommend eating 3 small meals a day and two healthy protein snacks in between.         Immunization History  Administered Date(s) Administered   Fluad Quad(high Dose 65+) 07/17/2019   Influenza, High Dose Seasonal PF 08/05/2016, 05/22/2017, 07/13/2018, 06/26/2020   Influenza,inj,Quad PF,6+ Mos 09/11/2015   PFIZER Comirnaty(Gray Top)Covid-19 Tri-Sucrose Vaccine 01/27/2021   PFIZER(Purple Top)SARS-COV-2 Vaccination 11/06/2019, 11/27/2019, 08/25/2020   Pneumococcal Conjugate-13 08/14/2014   Pneumococcal Polysaccharide-23 08/02/2007, 10/22/2012   Tdap 06/09/2010    Zoster, Live 10/22/2012    Health Maintenance  Topic Date Due   Zoster Vaccines- Shingrix (1 of 2) Never done   FOOT EXAM  01/23/2018   INFLUENZA VACCINE  04/26/2021   COVID-19 Vaccine (5 - Booster for Pfizer series) 05/30/2021   OPHTHALMOLOGY EXAM  06/08/2021   TETANUS/TDAP  09/15/2021 (Originally 06/09/2020)   HEMOGLOBIN A1C  07/20/2021   COLONOSCOPY (Pts 45-31yr Insurance coverage will need to be confirmed)  03/26/2025   Hepatitis C Screening  Completed   PNA vac Low Risk Adult  Completed   HPV VACCINES  Aged Out    Discussed health benefits of physical activity, and encouraged him to engage in regular exercise appropriate for his age and condition.  1. Annual physical exam   2. Type 2 diabetes mellitus with other circulatory complication, without long-term current use of insulin (HCC) Will A1c less than 7.5 - Hemoglobin A1c  3. Benign essential HTN  - CBC w/Diff/Platelet - Comprehensive Metabolic Panel (CMET)  4. Pure hypercholesterolemia On rosuvastatin 20 - Lipid panel - TSH - Comprehensive Metabolic Panel (CMET)  5. Arteriosclerosis of coronary artery Risk factors treated.  Patient on Plavix  6. Obstructive apnea On CPAP.  7. Need for influenza vaccination - Flu Vaccine QUAD High Dose(Fluad)  8. Barrett's esophagus with low grade dysplasia On PPI  9. Class 2 severe obesity due to excess calories with serious comorbidity and body mass index (BMI) of 37.0 to 37.9 in adult ([place]   10. Hx of BKA, right ([place] Static in [place]   No follow-ups on file.     I, RWilhemena Durie MD, have reviewed all documentation for this visit. The documentation on 06/13/21 for the exam, diagnosis, procedures, and orders are all accurate and complete.    Kaavya Puskarich GCranford Mon MD  [phone](phone) [phone](fax)  [place]

## 2021-06-09 DIAGNOSIS — E7800 Pure hypercholesterolemia, unspecified: Secondary | ICD-10-CM | POA: Diagnosis not present

## 2021-06-09 DIAGNOSIS — E1159 Type 2 diabetes mellitus with other circulatory complications: Secondary | ICD-10-CM | POA: Diagnosis not present

## 2021-06-09 DIAGNOSIS — I10 Essential (primary) hypertension: Secondary | ICD-10-CM | POA: Diagnosis not present

## 2021-06-10 LAB — CBC WITH DIFFERENTIAL/PLATELET
Basophils Absolute: 0.1 10*3/uL (ref 0.0–0.2)
Basos: 1 %
EOS (ABSOLUTE): 0.1 10*3/uL (ref 0.0–0.4)
Eos: 2 %
Hematocrit: 44.6 % (ref 37.5–51.0)
Hemoglobin: 14.9 g/dL (ref 13.0–17.7)
Immature Grans (Abs): 0 10*3/uL (ref 0.0–0.1)
Immature Granulocytes: 0 %
Lymphocytes Absolute: 1.2 10*3/uL (ref 0.7–3.1)
Lymphs: 19 %
MCH: 31.1 pg (ref 26.6–33.0)
MCHC: 33.4 g/dL (ref 31.5–35.7)
MCV: 93 fL (ref 79–97)
Monocytes Absolute: 0.7 10*3/uL (ref 0.1–0.9)
Monocytes: 11 %
Neutrophils Absolute: 4.3 10*3/uL (ref 1.4–7.0)
Neutrophils: 67 %
Platelets: 173 10*3/uL (ref 150–450)
RBC: 4.79 x10E6/uL (ref 4.14–5.80)
RDW: 13.9 % (ref 11.6–15.4)
WBC: 6.4 10*3/uL (ref 3.4–10.8)

## 2021-06-10 LAB — COMPREHENSIVE METABOLIC PANEL
ALT: 9 IU/L (ref 0–44)
AST: 17 IU/L (ref 0–40)
Albumin/Globulin Ratio: 3.1 — ABNORMAL HIGH (ref 1.2–2.2)
Albumin: 5 g/dL — ABNORMAL HIGH (ref 3.7–4.7)
Alkaline Phosphatase: 106 IU/L (ref 44–121)
BUN/Creatinine Ratio: 16 (ref 10–24)
BUN: 20 mg/dL (ref 8–27)
Bilirubin Total: 1.5 mg/dL — ABNORMAL HIGH (ref 0.0–1.2)
CO2: 22 mmol/L (ref 20–29)
Calcium: 9.8 mg/dL (ref 8.6–10.2)
Chloride: 101 mmol/L (ref 96–106)
Creatinine, Ser: 1.29 mg/dL — ABNORMAL HIGH (ref 0.76–1.27)
Globulin, Total: 1.6 g/dL (ref 1.5–4.5)
Glucose: 209 mg/dL — ABNORMAL HIGH (ref 65–99)
Potassium: 4.6 mmol/L (ref 3.5–5.2)
Sodium: 140 mmol/L (ref 134–144)
Total Protein: 6.6 g/dL (ref 6.0–8.5)
eGFR: 58 mL/min/{1.73_m2} — ABNORMAL LOW (ref >59)

## 2021-06-10 LAB — LIPID PANEL
Chol/HDL Ratio: 4.8 ratio (ref 0.0–5.0)
Cholesterol, Total: 158 mg/dL (ref 100–199)
HDL: 33 mg/dL — ABNORMAL LOW (ref >39)
LDL Chol Calc (NIH): 74 mg/dL (ref 0–99)
Triglycerides: 316 mg/dL — ABNORMAL HIGH (ref 0–149)
VLDL Cholesterol Cal: 51 mg/dL — ABNORMAL HIGH (ref 5–40)

## 2021-06-10 LAB — TSH: TSH: 1.97 u[IU]/mL (ref 0.450–4.500)

## 2021-06-10 LAB — HEMOGLOBIN A1C
Est. average glucose Bld gHb Est-mCnc: 160 mg/dL
Hgb A1c MFr Bld: 7.2 % — ABNORMAL HIGH (ref 4.8–5.6)

## 2021-06-18 ENCOUNTER — Other Ambulatory Visit: Payer: Self-pay | Admitting: Family Medicine

## 2021-06-18 DIAGNOSIS — E1159 Type 2 diabetes mellitus with other circulatory complications: Secondary | ICD-10-CM

## 2021-06-25 ENCOUNTER — Other Ambulatory Visit: Payer: Self-pay | Admitting: Family Medicine

## 2021-06-25 DIAGNOSIS — J301 Allergic rhinitis due to pollen: Secondary | ICD-10-CM

## 2021-06-25 DIAGNOSIS — I10 Essential (primary) hypertension: Secondary | ICD-10-CM

## 2021-07-07 DIAGNOSIS — H2511 Age-related nuclear cataract, right eye: Secondary | ICD-10-CM | POA: Diagnosis not present

## 2021-07-09 DIAGNOSIS — H25811 Combined forms of age-related cataract, right eye: Secondary | ICD-10-CM | POA: Diagnosis not present

## 2021-07-21 ENCOUNTER — Telehealth: Payer: Self-pay

## 2021-07-21 NOTE — Telephone Encounter (Signed)
Spoke with patient to ask him why he thought he needed to come by and have his sugar checked.  He said that someone had called him and said it was time for him to have it checked by [place].  Advised patient it was not and that it was checked in September when he was seen.  Also advised patient we had his follow up scheduled.  Patient could not recall who call him but if they called again he would get more information.

## 2021-07-21 NOTE — Telephone Encounter (Signed)
Copied from [place] [phone]. Topic: General - Other >> Jul 21, 2021  3:54 PM Rayann Heman wrote: Reason for CRM: pt called and stated that he was told to come in and get sugar tested. I don't see any orders in. Please advise

## 2021-07-22 DIAGNOSIS — K136 Irritative hyperplasia of oral mucosa: Secondary | ICD-10-CM | POA: Diagnosis not present

## 2021-08-10 ENCOUNTER — Ambulatory Visit (INDEPENDENT_AMBULATORY_CARE_PROVIDER_SITE_OTHER): Payer: PPO

## 2021-08-10 ENCOUNTER — Other Ambulatory Visit: Payer: Self-pay

## 2021-08-10 ENCOUNTER — Ambulatory Visit (INDEPENDENT_AMBULATORY_CARE_PROVIDER_SITE_OTHER): Payer: PPO | Admitting: Vascular Surgery

## 2021-08-10 VITALS — BP 161/69 | HR 69 | Ht 70.0 in | Wt 265.0 lb

## 2021-08-10 DIAGNOSIS — E1159 Type 2 diabetes mellitus with other circulatory complications: Secondary | ICD-10-CM | POA: Diagnosis not present

## 2021-08-10 DIAGNOSIS — Z89511 Acquired absence of right leg below knee: Secondary | ICD-10-CM | POA: Diagnosis not present

## 2021-08-10 DIAGNOSIS — I739 Peripheral vascular disease, unspecified: Secondary | ICD-10-CM

## 2021-08-10 DIAGNOSIS — E7800 Pure hypercholesterolemia, unspecified: Secondary | ICD-10-CM | POA: Diagnosis not present

## 2021-08-10 DIAGNOSIS — I10 Essential (primary) hypertension: Secondary | ICD-10-CM

## 2021-08-10 NOTE — Assessment & Plan Note (Signed)
Noninvasive studies today show stable left ABI of 1.14 with multiphasic waveforms and normal digital pressure.  Doing well.  No current issues.  Recheck in 1 year.  Contact our office with problems in the interim.

## 2021-08-10 NOTE — Progress Notes (Signed)
MRN : [id number]  Andrew Lyons is a 75 y.o. (May 02, 1946) male who presents with chief complaint of  Chief Complaint  Patient presents with   Follow-up    1 yr ABI  .  History of Present Illness: Patient returns today in follow up of his PAD and previous right leg amputation.  He is wearing his prosthesis and walking well without any difficulty.  No issues from this.  No left leg problems at current.  No rest pain, ulceration, or disabling claudication symptoms on the left.  Noninvasive studies today show stable left ABI of 1.14 with multiphasic waveforms and normal digital pressure.  Current Outpatient Medications  Medication Sig Dispense Refill   amLODipine-benazepril (LOTREL) 5-40 MG capsule TAKE 1 CAPSULE BY MOUTH ONCE DAILY 90 capsule 0   B-D UF III MINI PEN NEEDLES 31G X 5 MM MISC USE WITH PEN DAILY 100 each 3   Blood Glucose Monitoring Suppl (ONE TOUCH ULTRA SYSTEM KIT) w/Device KIT To use daily to check sugar. DX E11.9-needs one touch ultra meter 1 each 0   cholecalciferol (VITAMIN D) 25 MCG (1000 UT) tablet Take 1,000 Units by mouth daily.      clopidogrel (PLAVIX) 75 MG tablet TAKE 1 TABLET BY MOUTH ONCE A DAY 90 tablet 3   Coenzyme Q10 100 MG TABS Take 100 mg by mouth daily.      fluticasone (FLONASE) 50 MCG/ACT nasal spray SPRAY 2 SPRAYS INTO EACH NOSTRIL EVERY DAY 48 mL 3   ibuprofen (ADVIL) 800 MG tablet Take 1 tablet (800 mg total) by mouth every 8 (eight) hours as needed for mild pain or moderate pain. 30 tablet 0   JARDIANCE 25 MG TABS tablet TAKE 1 TABLET BY MOUTH ONCE DAILY 90 tablet 3   Lancets (ONETOUCH ULTRASOFT) lancets USE 1  TO CHECK GLUCOSE THREE TIMES DAILY 100 each 11   metFORMIN (GLUCOPHAGE) 500 MG tablet TAKE 1 TABLET BY MOUTH TWICE A DAY WITH A MEAL 180 tablet 1   metoprolol succinate (TOPROL-XL) 100 MG 24 hr tablet TAKE 1 TABLET BY MOUTH ONCE A DAY WITH OR IMMEDIATELY FOLLOWING A MEAL 90 tablet 3   montelukast (SINGULAIR) 10 MG tablet TAKE 1 TABLET BY  MOUTH ONCE A DAY 90 tablet 0   omeprazole (PRILOSEC) 40 MG capsule TAKE 1 CAPSULE BY MOUTH ONCE DAILY 90 capsule 0   ONETOUCH ULTRA test strip USE 1 STRIP TO CHECK GLUCOSE TWICE DAILY AS NEEDED 100 each 0   rosuvastatin (CRESTOR) 20 MG tablet TAKE 1 TABLET BY MOUTH ONCE A DAY 90 tablet 0   sildenafil (VIAGRA) 100 MG tablet Take 50 mg by mouth daily as needed for erectile dysfunction.     tadalafil (CIALIS) 5 MG tablet TAKE ONE TABLET BY MOUTH DAILY AS NEEDED FOR ERECTILE DYSFUNCTION 90 tablet 4   VICTOZA 18 MG/3ML SOPN INJECT 0.3 MLS (1.8 MG) UNDER THE SKIN ONCE DAILY 9 mL 5   vitamin B-12 (CYANOCOBALAMIN) 1000 MCG tablet Take 1,000 mcg by mouth daily.     No current facility-administered medications for this visit.    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    Barrett's esophagus with dysplasia    Cataract    COPD (chronic obstructive pulmonary disease) (HCC)    Coronary artery disease    Diabetes mellitus without complication (HCC)    GERD (gastroesophageal reflux disease)    Hepatitis    Hx of dysplastic nevus 10/19/2006  L upper back, medial to mid scapula, slight to moderate atypia   Hx of dysplastic nevus 03/18/2008   R upper back 4.0cm lat to spine, moderate to marked atypia, excised 04/15/2008   Hx of dysplastic nevus 09/22/2008   R mid back 5.0cm lat to spine, moderate atypia   Hx of dysplastic nevus 04/06/2009   L mid to low back 11.0cm lat to spine, mild atypia   Hyperlipidemia    Hypertension    Myocardial infarction ([place])    Seizures ([place])    Sleep apnea     Past Surgical History:  Procedure Laterality Date   ABDOMINAL AORTOGRAM W/LOWER EXTREMITY N/A 06/20/2018   Procedure: ABDOMINAL AORTOGRAM W/LOWER EXTREMITY;  Surgeon: Wellington Hampshire, MD;  Location: [place] [facility];  Service: Cardiovascular;  Laterality: N/A;   AMPUTATION Right 10/27/2018   Procedure: AMPUTATION BELOW KNEE;  Surgeon: Leim Fabry, MD;   Location: [facility];  Service: Orthopedics;  Laterality: Right;   CARDIAC CATHETERIZATION     with Angioplasty   cardiac stents     CARDIAC VALVE REPLACEMENT     Aortic Valve Replacement   COLONOSCOPY N/A 03/26/2020   Procedure: COLONOSCOPY;  Surgeon: Benjamine Sprague, DO;  Location: [facility];  Service: General;  Laterality: N/A;   COLONOSCOPY WITH PROPOFOL N/A 03/18/2015   Procedure: COLONOSCOPY WITH PROPOFOL;  Surgeon: [name], MD;  Location: [facility];  Service: Endoscopy;  Laterality: N/A;   ESOPHAGOGASTRODUODENOSCOPY N/A 03/18/2015   Procedure: ESOPHAGOGASTRODUODENOSCOPY (EGD);  Surgeon: [name], MD;  Location: [facility] ENDOSCOPY;  Service: Endoscopy;  Laterality: N/A;   FOOT AMPUTATION     GREEN LIGHT LASER TURP (TRANSURETHRAL RESECTION OF PROSTATE N/A 10/16/2018   Procedure: GREEN LIGHT LASER TURP (TRANSURETHRAL RESECTION OF PROSTATE;  Surgeon: Royston Cowper, MD;  Location: [facility];  Service: Urology;  Laterality: N/A;   HARDWARE REMOVAL Right 10/27/2018   Procedure: HARDWARE REMOVAL;  Surgeon: Leim Fabry, MD;  Location: [facility];  Service: Orthopedics;  Laterality: Right;   HEMORRHOID SURGERY N/A 03/26/2020   Procedure: HEMORRHOIDECTOMY;  Surgeon: Benjamine Sprague, DO;  Location: [facility];  Service: General;  Laterality: N/A;   LOWER EXTREMITY ANGIOGRAPHY Right 10/25/2018   Procedure: Lower Extremity Angiography;  Surgeon: Algernon Huxley, MD;  Location: [place] [facility];  Service: Cardiovascular;  Laterality: Right;   PERIPHERAL VASCULAR INTERVENTION Right 06/20/2018   Procedure: PERIPHERAL VASCULAR INTERVENTION;  Surgeon: Wellington Hampshire, MD;  Location: [place] [facility];  Service: Cardiovascular;  Laterality: Right;   THORACOTOMY Right    TONSILLECTOMY       Social History   Tobacco Use   Smoking status: Former    Types: Cigarettes    Quit date: 10/02/2001    Years since quitting: 19.8   Smokeless tobacco: Former   Tobacco comments:    Quit smoking in 2003;  Started smoking at age 56, smoked about 40 years, smoked over 3 packs per day  Vaping Use   Vaping Use: Never used  Substance Use Topics   Alcohol use: Not Currently   Drug use: No      Family History  Problem Relation Age of Onset   Obesity Son    Diabetes Brother    Hypertension Brother    Heart attack Mother    Heart attack Father      Allergies  Allergen Reactions   Iodinated Diagnostic Agents Rash    had a red chest -unusre of what kind of dye it was  Zyban [Bupropion] Rash   Zocor [Simvastatin] Other (See Comments)    Reaction: hepatitis    Gadolinium Derivatives Rash   REVIEW OF SYSTEMS (Negative unless checked)   Constitutional: _0 Weight loss  _1 Fever  _2 Chills Cardiac: _3 Chest pain   _4 Chest pressure   _5 Palpitations   _6 Shortness of breath when laying flat   _7 Shortness of breath at rest   _8 Shortness of breath with exertion. Vascular:  _9 Pain in legs with walking   _10 Pain in legs at rest   _11 Pain in legs when laying flat   _12 Claudication   _13 Pain in feet when walking  _14 Pain in feet at rest  _15 Pain in feet when laying flat   _16 History of DVT   _17 Phlebitis   _18 Swelling in legs   _19 Varicose veins   _20 Non-healing ulcers Pulmonary:   _21 Uses home oxygen   _22 Productive cough   _23 Hemoptysis   _24 Wheeze  _25 COPD   _26 Asthma Neurologic:  _27 Dizziness  _28 Blackouts   _29 Seizures   _30 History of stroke   _31 History of TIA  _32 Aphasia   _33 Temporary blindness   _34 Dysphagia   _35 Weakness or numbness in arms   _36 Weakness or numbness in legs Musculoskeletal:  _37 Arthritis   _38 Joint swelling   _39 Joint pain   _40 Low back pain Hematologic:  _41 Easy bruising  _42 Easy bleeding   _43 Hypercoagulable state   _44 Anemic   Gastrointestinal:  _45 Blood in stool   _46 Vomiting blood  _47 Gastroesophageal reflux/heartburn   _48 Abdominal pain Genitourinary:  _49 Chronic kidney disease   _50 Difficult urination  _51 Frequent urination  _52 Burning with urination   _53 Hematuria Skin:  _54 Rashes   _55 Ulcers    _56 Wounds Psychological:  _57 History of anxiety   _58  History of major depression.  Physical Examination  BP (!) 161/69   Pulse 69   Ht _59  (1.778 m)   Wt 265 lb (120.2 kg)   BMI 38.02 kg/m  Gen:  WD/WN, NAD Head: [place]/AT, No temporalis wasting. Ear/Nose/Throat: Hearing grossly intact, nares w/o erythema or drainage Eyes: Conjunctiva clear. Sclera non-icteric Neck: Supple.  Trachea midline Pulmonary:  Good air movement, no use of accessory muscles.  Cardiac: RRR, no JVD Vascular:  Vessel Right Left  Radial Palpable Palpable                          PT Not Palpable Palpable  DP Not Palpable Palpable   Gastrointestinal: soft, non-tender/non-distended. No guarding/reflex.  Musculoskeletal: M/S 5/5 throughout.  No deformity or atrophy. 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 No left leg edema. Neurologic: Sensation grossly intact in extremities.  Symmetrical.  Speech is fluent.  Psychiatric: Judgment intact, Mood & affect appropriate for pt's clinical situation. Dermatologic: No rashes or ulcers noted.  No cellulitis or open wounds.      Labs Recent Results (from the past 2160 hour(s))  Lipid panel     Status: Abnormal   Collection Time: 06/09/21  8:32 AM  Result Value Ref Range   Cholesterol, Total 158 100 - 199 mg/dL   Triglycerides 316 (H) 0 - 149 mg/dL   HDL 33 (L) >39 mg/dL   VLDL Cholesterol Cal 51 (H) 5 - 40 mg/dL   LDL Chol Calc (NIH) 74 0 - 99 mg/dL   Chol/HDL Ratio 4.8 0.0 - 5.0 ratio    Comment:                                   T. Chol/HDL Ratio  Men  Women                               1/2 Avg.Risk  3.4    3.3                                   Avg.Risk  5.0    4.4                                2X Avg.Risk  9.6    7.1                                3X Avg.Risk 23.4   11.0   TSH     Status: None   Collection Time: 06/09/21  8:32 AM  Result Value Ref Range   TSH 1.970 0.450 - 4.500 uIU/mL  CBC w/Diff/Platelet      Status: None   Collection Time: 06/09/21  8:32 AM  Result Value Ref Range   WBC 6.4 3.4 - 10.8 x10E3/uL   RBC 4.79 4.14 - 5.80 x10E6/uL   Hemoglobin 14.9 13.0 - 17.7 g/dL   Hematocrit 44.6 37.5 - 51.0 %   MCV 93 79 - 97 fL   MCH 31.1 26.6 - 33.0 pg   MCHC 33.4 31.5 - 35.7 g/dL   RDW 13.9 11.6 - 15.4 %   Platelets 173 150 - 450 x10E3/uL   Neutrophils 67 Not Estab. %   Lymphs 19 Not Estab. %   Monocytes 11 Not Estab. %   Eos 2 Not Estab. %   Basos 1 Not Estab. %   Neutrophils Absolute 4.3 1.4 - 7.0 x10E3/uL   Lymphocytes Absolute 1.2 0.7 - 3.1 x10E3/uL   Monocytes Absolute 0.7 0.1 - 0.9 x10E3/uL   EOS (ABSOLUTE) 0.1 0.0 - 0.4 x10E3/uL   Basophils Absolute 0.1 0.0 - 0.2 x10E3/uL   Immature Granulocytes 0 Not Estab. %   Immature Grans (Abs) 0.0 0.0 - 0.1 x10E3/uL  Comprehensive Metabolic Panel (CMET)     Status: Abnormal   Collection Time: 06/09/21  8:32 AM  Result Value Ref Range   Glucose 209 (H) 65 - 99 mg/dL   BUN 20 8 - 27 mg/dL   Creatinine, Ser 1.29 (H) 0.76 - 1.27 mg/dL   eGFR 58 (L) >59 mL/min/1.73   BUN/Creatinine Ratio 16 10 - 24   Sodium 140 134 - 144 mmol/L   Potassium 4.6 3.5 - 5.2 mmol/L   Chloride 101 96 - 106 mmol/L   CO2 22 20 - 29 mmol/L   Calcium 9.8 8.6 - 10.2 mg/dL   Total Protein 6.6 6.0 - 8.5 g/dL   Albumin 5.0 (H) 3.7 - 4.7 g/dL   Globulin, Total 1.6 1.5 - 4.5 g/dL   Albumin/Globulin Ratio 3.1 (H) 1.2 - 2.2   Bilirubin Total 1.5 (H) 0.0 - 1.2 mg/dL   Alkaline Phosphatase 106 44 - 121 IU/L   AST 17 0 - 40 IU/L   ALT 9 0 - 44 IU/L  Hemoglobin A1c     Status: Abnormal   Collection Time: 06/09/21  8:32 AM  Result Value Ref Range   Hgb A1c MFr Bld 7.2 (H) 4.8 - 5.6 %    Comment:  Prediabetes: 5.7 - 6.4          Diabetes: >6.4          Glycemic control for adults with diabetes: <7.0    Est. average glucose Bld gHb Est-mCnc 160 mg/dL    Radiology No results found.  Assessment/Plan Benign essential HTN blood pressure control  important in reducing the progression of atherosclerotic disease. On appropriate oral medications.     Diabetes blood glucose control important in reducing the progression of atherosclerotic disease. Also, involved in wound healing. On appropriate medications.     HLD (hyperlipidemia) lipid control important in reducing the progression of atherosclerotic disease. 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  Doing well.  No current issues.  Recheck in 1 year.  Contact our office with problems in the interim.    Leotis Pain, MD  08/10/2021 12:15 PM    This note was created with Dragon medical transcription system.  Any errors from dictation are purely unintentional

## 2021-08-12 ENCOUNTER — Other Ambulatory Visit: Payer: Self-pay | Admitting: Family Medicine

## 2021-09-01 DIAGNOSIS — K136 Irritative hyperplasia of oral mucosa: Secondary | ICD-10-CM | POA: Diagnosis not present

## 2021-09-10 ENCOUNTER — Other Ambulatory Visit: Payer: Self-pay | Admitting: Family Medicine

## 2021-09-10 DIAGNOSIS — J301 Allergic rhinitis due to pollen: Secondary | ICD-10-CM

## 2021-09-10 DIAGNOSIS — I10 Essential (primary) hypertension: Secondary | ICD-10-CM

## 2021-09-10 DIAGNOSIS — E118 Type 2 diabetes mellitus with unspecified complications: Secondary | ICD-10-CM

## 2021-09-11 NOTE — Telephone Encounter (Signed)
Requested Prescriptions  Pending Prescriptions Disp Refills   omeprazole (PRILOSEC) 40 MG capsule [Pharmacy Med Name: OMEPRAZOLE DR 40 MG CAP] 90 capsule 3    Sig: TAKE 1 CAPSULE BY MOUTH ONCE DAILY     Gastroenterology: Proton Pump Inhibitors Passed - 09/10/2021  3:29 PM      Passed - Valid encounter within last 12 months    Recent Outpatient Visits          3 months ago Annual physical exam   [facility] [name]., MD   7 months ago Type 2 diabetes mellitus with other circulatory complication, without long-term current use of insulin [facility])   [facility] [name]., MD   11 months ago Type 2 diabetes mellitus with other circulatory complication, without long-term current use of insulin ([place])   [facility] [name]., MD   1 year ago Type 2 diabetes mellitus with other circulatory complication, without long-term current use of insulin [facility])   [facility] [name]., MD   1 year ago Annual physical exam   [facility] [name]., MD      Future Appointments            In 1 month [name]., MD [facility]            montelukast (SINGULAIR) 10 MG tablet [Pharmacy Med Name: MONTELUKAST SODIUM 10 MG TAB] 90 tablet 3    Sig: TAKE 1 TABLET BY MOUTH ONCE A DAY     Pulmonology:  Leukotriene Inhibitors Passed - 09/10/2021  3:29 PM      Passed - Valid encounter within last 12 months    Recent Outpatient Visits          3 months ago Annual physical exam   [facility] [name]., MD   7 months ago Type 2 diabetes mellitus with other circulatory complication, without long-term current use of insulin ([place])   [facility] [name]., MD   11 months ago Type 2 diabetes mellitus with other circulatory complication, without long-term current use of insulin [facility])    [facility] [name]., MD   1 year ago Type 2 diabetes mellitus with other circulatory complication, without long-term current use of insulin [facility])   [facility] [name]., MD   1 year ago Annual physical exam   [facility] [name]., MD      Future Appointments            In 1 month [name]., MD [facility]            amLODipine-benazepril (LOTREL) 5-40 MG capsule [Pharmacy Med Name: AMLODIPINE-BENAZEPRIL 5-40 MG CAP] 90 capsule 1    Sig: TAKE 1 CAPSULE BY MOUTH ONCE DAILY     Cardiovascular: CCB + ACEI Combos Failed - 09/10/2021  3:29 PM      Failed - Cr in normal range and within 180 days    Creatinine, Ser  Date Value Ref Range Status  06/09/2021 1.29 (H) 0.76 - 1.27 mg/dL Final         Failed - Last BP in normal range    BP Readings from Last 1 Encounters:  08/10/21 (!) 161/69         Passed - K in normal range and within 180  days    Potassium  Date Value Ref Range Status  06/09/2021 4.6 3.5 - 5.2 mmol/L Final         Passed - Patient is not pregnant      Passed - Valid encounter within last 6 months    Recent Outpatient Visits          3 months ago Annual physical exam   [facility] [name]., MD   7 months ago Type 2 diabetes mellitus with other circulatory complication, without long-term current use of insulin [facility])   [facility] [name]., MD   11 months ago Type 2 diabetes mellitus with other circulatory complication, without long-term current use of insulin [facility])   [facility] [name]., MD   1 year ago Type 2 diabetes mellitus with other circulatory complication, without long-term current use of insulin [facility])   [facility] [name]., MD   1 year ago Annual physical exam   [facility] [name]., MD       Future Appointments            In 1 month [name]., MD [facility]

## 2021-10-11 ENCOUNTER — Telehealth: Payer: Self-pay

## 2021-10-11 ENCOUNTER — Other Ambulatory Visit: Payer: Self-pay | Admitting: Family Medicine

## 2021-10-11 DIAGNOSIS — E118 Type 2 diabetes mellitus with unspecified complications: Secondary | ICD-10-CM

## 2021-10-11 NOTE — Telephone Encounter (Signed)
Copied from [place] [phone]. Topic: Appointment Scheduling - Scheduling Inquiry for Clinic >> Oct 11, 2021  8:32 AM Valere Dross wrote: Reason for CRM: Pt called in to reschedule his [place] appt, but I was instructed by Jiles Garter to send over a CRM so Nurse advisor can reach out to the pt, please advise.

## 2021-10-12 ENCOUNTER — Ambulatory Visit: Payer: PPO | Admitting: Family Medicine

## 2021-10-18 ENCOUNTER — Ambulatory Visit (INDEPENDENT_AMBULATORY_CARE_PROVIDER_SITE_OTHER): Payer: PPO

## 2021-10-18 ENCOUNTER — Other Ambulatory Visit: Payer: Self-pay

## 2021-10-18 VITALS — BP 128/70 | HR 57 | Temp 97.7°F | Ht 70.0 in | Wt 258.6 lb

## 2021-10-18 DIAGNOSIS — Z0000 Encounter for general adult medical examination without abnormal findings: Secondary | ICD-10-CM

## 2021-10-18 NOTE — Progress Notes (Signed)
Subjective:   Andrew Lyons is a 76 y.o. male who presents for Medicare Annual/Subsequent preventive examination.  Review of Systems           Objective:    Today's Vitals   10/18/21 1537  BP: 128/70  Pulse: (!) 57  Temp: 97.7 F (36.5 C)  SpO2: 98%  Weight: 258 lb 9.6 oz (117.3 kg)  Height: _0  (1.778 m)   Body mass index is 37.11 kg/m.  Advanced Directives 09/15/2020 03/25/2020 08/15/2019 10/25/2018 10/24/2018 10/02/2018 08/10/2018  Does Patient Have a Medical Advance Directive? _1  No No  Type of Advance Directive - - - - - - -  Copy of [place] in Chart? - - - - - - -  Would patient like information on creating a medical advance directive? No - Patient declined No - Patient declined No - Patient declined No - Patient declined No - Patient declined - No - Patient declined    Current Medications (verified) Outpatient Encounter Medications as of 10/18/2021  Medication Sig   amLODipine-benazepril (LOTREL) 5-40 MG capsule TAKE 1 CAPSULE BY MOUTH ONCE DAILY   B-D UF III MINI PEN NEEDLES 31G X 5 MM MISC USE WITH PEN DAILY   Blood Glucose Monitoring Suppl (ONE TOUCH ULTRA SYSTEM KIT) w/Device KIT To use daily to check sugar. DX E11.9-needs one touch ultra meter   cholecalciferol (VITAMIN D) 25 MCG (1000 UT) tablet Take 1,000 Units by mouth daily.    clopidogrel (PLAVIX) 75 MG tablet TAKE 1 TABLET BY MOUTH ONCE A DAY   Coenzyme Q10 100 MG TABS Take 100 mg by mouth daily.    fluticasone (FLONASE) 50 MCG/ACT nasal spray USE 2 SPRAYS INTO EACH NOSTRIL EVERY DAY   ibuprofen (ADVIL) 800 MG tablet Take 1 tablet (800 mg total) by mouth every 8 (eight) hours as needed for mild pain or moderate pain.   JARDIANCE 25 MG TABS tablet TAKE 1 TABLET BY MOUTH ONCE DAILY   Lancets (ONETOUCH ULTRASOFT) lancets USE 1  TO CHECK GLUCOSE THREE TIMES DAILY   metFORMIN (GLUCOPHAGE) 500 MG tablet TAKE 1 TABLET BY MOUTH TWICE A DAY WITH A MEAL   metoprolol succinate  (TOPROL-XL) 100 MG 24 hr tablet TAKE 1 TABLET BY MOUTH ONCE A DAY WITH OR IMMEDIATELY FOLLOWING A MEAL   montelukast (SINGULAIR) 10 MG tablet TAKE 1 TABLET BY MOUTH ONCE A DAY   omeprazole (PRILOSEC) 40 MG capsule TAKE 1 CAPSULE BY MOUTH ONCE DAILY   ONETOUCH ULTRA test strip USE 1 STRIP TO CHECK GLUCOSE TWICE DAILY AS NEEDED   rosuvastatin (CRESTOR) 20 MG tablet TAKE 1 TABLET BY MOUTH ONCE A DAY   sildenafil (VIAGRA) 100 MG tablet Take 50 mg by mouth daily as needed for erectile dysfunction.   tadalafil (CIALIS) 5 MG tablet TAKE ONE TABLET BY MOUTH DAILY AS NEEDED FOR ERECTILE DYSFUNCTION   VICTOZA 18 MG/3ML SOPN INJECT 0.3 MLS (1.8 MG) UNDER THE SKIN ONCE DAILY   vitamin B-12 (CYANOCOBALAMIN) 1000 MCG tablet Take 1,000 mcg by mouth daily.   No facility-administered encounter medications on file as of 10/18/2021.    Allergies (verified) Iodinated contrast media, Zyban [bupropion], Zocor [simvastatin], and Gadolinium derivatives   History: 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    Barrett's esophagus with dysplasia    Cataract    COPD (chronic obstructive pulmonary disease) (HCC)    Coronary artery disease  Diabetes mellitus without complication (HCC)    GERD (gastroesophageal reflux disease)    Hepatitis    Hx of dysplastic nevus 10/19/2006   L upper back, medial to mid scapula, slight to moderate atypia   Hx of dysplastic nevus 03/18/2008   R upper back 4.0cm lat to spine, moderate to marked atypia, excised 04/15/2008   Hx of dysplastic nevus 09/22/2008   R mid back 5.0cm lat to spine, moderate atypia   Hx of dysplastic nevus 04/06/2009   L mid to low back 11.0cm lat to spine, mild atypia   Hyperlipidemia    Hypertension    Myocardial infarction ([place])    Seizures ([place])    Sleep apnea    Past Surgical History:  Procedure Laterality Date   ABDOMINAL AORTOGRAM W/LOWER EXTREMITY N/A 06/20/2018   Procedure: ABDOMINAL AORTOGRAM  W/LOWER EXTREMITY;  Surgeon: Wellington Hampshire, MD;  Location: [place] [facility];  Service: Cardiovascular;  Laterality: N/A;   AMPUTATION Right 10/27/2018   Procedure: AMPUTATION BELOW KNEE;  Surgeon: Leim Fabry, MD;  Location: [facility];  Service: Orthopedics;  Laterality: Right;   CARDIAC CATHETERIZATION     with Angioplasty   cardiac stents     CARDIAC VALVE REPLACEMENT     Aortic Valve Replacement   COLONOSCOPY N/A 03/26/2020   Procedure: COLONOSCOPY;  Surgeon: Benjamine Sprague, DO;  Location: [facility];  Service: General;  Laterality: N/A;   COLONOSCOPY WITH PROPOFOL N/A 03/18/2015   Procedure: COLONOSCOPY WITH PROPOFOL;  Surgeon: [name], MD;  Location: [facility];  Service: Endoscopy;  Laterality: N/A;   ESOPHAGOGASTRODUODENOSCOPY N/A 03/18/2015   Procedure: ESOPHAGOGASTRODUODENOSCOPY (EGD);  Surgeon: [name], MD;  Location[facility];  Service: Endoscopy;  Laterality: N/A;   FOOT AMPUTATION     GREEN LIGHT LASER TURP (TRANSURETHRAL RESECTION OF PROSTATE N/A 10/16/2018   Procedure: GREEN LIGHT LASER TURP (TRANSURETHRAL RESECTION OF PROSTATE;  Surgeon: Royston Cowper, MD;  Location: [facility];  Service: Urology;  Laterality: N/A;   HARDWARE REMOVAL Right 10/27/2018   Procedure: HARDWARE REMOVAL;  Surgeon: Leim Fabry, MD;  Location: [facility];  Service: Orthopedics;  Laterality: Right;   HEMORRHOID SURGERY N/A 03/26/2020   Procedure: HEMORRHOIDECTOMY;  Surgeon: Benjamine Sprague, DO;  Location: [facility];  Service: General;  Laterality: N/A;   LOWER EXTREMITY ANGIOGRAPHY Right 10/25/2018   Procedure: Lower Extremity Angiography;  Surgeon: Algernon Huxley, MD;  Location: [place] [facility];  Service: Cardiovascular;  Laterality: Right;   PERIPHERAL VASCULAR INTERVENTION Right 06/20/2018   Procedure: PERIPHERAL VASCULAR INTERVENTION;  Surgeon: Wellington Hampshire, MD;  Location: [place] [facility];  Service: Cardiovascular;  Laterality: Right;   THORACOTOMY Right    TONSILLECTOMY      Family History  Problem Relation Age of Onset   Obesity Son    Diabetes Brother    Hypertension Brother    Heart attack Mother    Heart attack Father    Social History   Socioeconomic History   Marital status: Married    Spouse name: Not on file   Number of children: 2   Years of education: Not on file   Highest education level: 8th grade  Occupational History   Occupation: retired  Tobacco Use   Smoking status: Former    Types: Cigarettes    Quit date: 10/02/2001    Years since quitting: 20.0   Smokeless tobacco: Former   Tobacco comments:    Quit smoking in 2003; Started smoking at age 37, smoked  about 40 years, smoked over 3 packs per day  Vaping Use   Vaping Use: Never used  Substance and Sexual Activity   Alcohol use: Not Currently   Drug use: No   Sexual activity: Not on file  Other Topics Concern   Not on file  Social History Narrative   Not on file   Social Determinants of Health   Financial Resource Strain: Not on file  Food Insecurity: Not on file  Transportation Needs: Not on file  Physical Activity: Not on file  Stress: Not on file  Social Connections: Not on file    Tobacco Counseling Counseling given: Not Answered Tobacco comments: Quit smoking in 2003; Started smoking at age 25, smoked about 40 years, smoked over 3 packs per day   Clinical Intake:  Pre-visit preparation completed: Yes  Pain : No/denies pain     Nutritional Risks: None Diabetes: Yes CBG done?: No Did pt. bring in CBG monitor from home?: No  How often do you need to have someone help you when you read instructions, pamphlets, or other written materials from your doctor or pharmacy?: 1 - Never  Diabetic?yes  Interpreter Needed?: No  Information entered by :: Kirke Shaggy, LPN   Activities of Daily Living In your present state of health, do you have any difficulty performing the following activities: 06/08/2021  Hearing? Y  Vision? N  Difficulty concentrating  or making decisions? Y  Walking or climbing stairs? N  Dressing or bathing? N  Doing errands, shopping? N  Some recent data might be hidden    Patient Care Team: [name]., MD as PCP - General (Family Medicine) Corey Skains, MD as Consulting Physician (Cardiology) Lucky Cowboy Erskine Squibb, MD as Referring Physician (Vascular Surgery) Ralene Bathe, MD (Dermatology) Earnestine Leys, MD (Orthopedic Surgery) Benjamine Sprague, DO as Consulting Physician (Surgery) Marchia Meiers, MD as Consulting Physician (Ophthalmology)  Indicate any recent Medical Services you may have received from other than [facility] providers in the past year (date may be approximate).     Assessment:   This is a routine wellness examination for Andrew Lyons.  Hearing/Vision screen No results found.  Dietary issues and exercise activities discussed:     Goals Addressed   None    Depression Screen PHQ 2/9 Scores 06/08/2021 09/15/2020 08/15/2019 12/11/2018 08/10/2018 05/18/2017 05/16/2016  PHQ - 2 Score 0 0 0 0 0 0 0  PHQ- 9 Score 0 - - - - - -    Fall Risk Fall Risk  06/08/2021 09/15/2020 04/21/2020 08/15/2019 12/11/2018  Falls in the past year? 0 0 0 0 0  Comment - - Emmi Telephone Survey: data to providers prior to load - -  Number falls in past yr: 0 0 - 0 -  Injury with Fall? 0 0 - 0 -  Risk for fall due to : Impaired balance/gait;Orthopedic patient - - - -  Follow up Falls evaluation completed - - - -    FALL RISK PREVENTION PERTAINING TO THE HOME:  Any stairs in or around the home? Yes  If so, are there any without handrails? No  Home free of loose throw rugs in walkways, pet beds, electrical cords, etc? Yes  Adequate lighting in your home to reduce risk of falls? Yes   ASSISTIVE DEVICES UTILIZED TO PREVENT FALLS:  Life alert? No  Use of a cane, walker or w/c? No  Grab bars in the bathroom? No  Shower chair or bench in shower? Yes  Elevated  toilet seat or a handicapped toilet? Yes   TIMED UP  AND GO:  Was the test performed? Yes .  Length of time to ambulate 10 feet: 7 sec.   Gait slow and steady without use of assistive device  Cognitive Function:     6CIT Screen 08/10/2018  What Year? 0 points  What month? 0 points  What time? 0 points  Count back from 20 0 points  Months in reverse 0 points  Repeat phrase 0 points  Total Score 0    Immunizations Immunization History  Administered Date(s) Administered   Fluad Quad(high Dose 65+) 07/17/2019, 06/08/2021   Influenza, High Dose Seasonal PF 08/05/2016, 05/22/2017, 07/13/2018, 06/26/2020   Influenza,inj,Quad PF,6+ Mos 09/11/2015   PFIZER Comirnaty(Gray Top)Covid-19 Tri-Sucrose Vaccine 01/27/2021   PFIZER(Purple Top)SARS-COV-2 Vaccination 11/06/2019, 11/27/2019, 08/25/2020   Pneumococcal Conjugate-13 08/14/2014   Pneumococcal Polysaccharide-23 08/02/2007, 10/22/2012   Tdap 06/09/2010   Zoster, Live 10/22/2012    TDAP status: Due, Education has been provided regarding the importance of this vaccine. Advised may receive this vaccine at local pharmacy or Health Dept. Aware to provide a copy of the vaccination record if obtained from local pharmacy or Health Dept. Verbalized acceptance and understanding.  Flu Vaccine status: Up to date  Pneumococcal vaccine status: Up to date  Covid-19 vaccine status: Completed vaccines  Qualifies for Shingles Vaccine? Yes   Zostavax completed No   Shingrix Completed?: No.    Education has been provided regarding the importance of this vaccine. Patient has been advised to call insurance company to determine out of pocket expense if they have not yet received this vaccine. Advised may also receive vaccine at local pharmacy or Health Dept. Verbalized acceptance and understanding.  Screening Tests Health Maintenance  Topic Date Due   Zoster Vaccines- Shingrix (1 of 2) Never done   FOOT EXAM  01/23/2018   TETANUS/TDAP  06/09/2020   COVID-19 Vaccine (5 - Booster for Pfizer series)  03/24/2021   OPHTHALMOLOGY EXAM  06/08/2021   HEMOGLOBIN A1C  12/07/2021   COLONOSCOPY (Pts 45-29yr Insurance coverage will need to be confirmed)  03/26/2025   Pneumonia Vaccine 75 Years old  Completed   INFLUENZA VACCINE  Completed   Hepatitis C Screening  Completed   HPV VACCINES  Aged Out    Health Maintenance  Health Maintenance Due  Topic Date Due   Zoster Vaccines- Shingrix (1 of 2) Never done   FOOT EXAM  01/23/2018   TETANUS/TDAP  06/09/2020   COVID-19 Vaccine (5 - Booster for Pfizer series) 03/24/2021   OPHTHALMOLOGY EXAM  06/08/2021    Colorectal cancer screening: Type of screening: Colonoscopy. Completed 03/26/20. Repeat every 10 years  Lung Cancer Screening: (Low Dose CT Chest recommended if Age 75-80years, 30 pack-year currently smoking OR have quit w/in 15years.) does not qualify.    Additional Screening:  Hepatitis C Screening: does qualify; Completed no  Vision Screening: Recommended annual ophthalmology exams for early detection of glaucoma and other disorders of the eye. Is the patient up to date with their annual eye exam?  Yes  Who is the provider or what is the name of the office in which the patient attends annual eye exams[place] is not established with a provider, would they like to be referred to a provider to establish care? No .   Dental Screening: Recommended annual dental exams for proper oral hygiene  Community Resource Referral / Chronic Care Management: CRR required this visit?  No  CCM required this visit?  No      Plan:     I have personally reviewed and noted the following in the patients chart:   Medical and social history Use of alcohol, tobacco or illicit drugs  Current medications and supplements including opioid prescriptions. Patient is not currently taking opioid prescriptions. Functional ability and status Nutritional status Physical activity Advanced directives List of other  physicians Hospitalizations, surgeries, and ER visits in previous 12 months Vitals Screenings to include cognitive, depression, and falls Referrals and appointments  In addition, I have reviewed and discussed with patient certain preventive protocols, quality metrics, and best practice recommendations. A written personalized care plan for preventive services as well as general preventive health recommendations were provided to patient.     Dionisio David, LPN   06/24/2445   Nurse Notes: none

## 2021-10-18 NOTE — Patient Instructions (Signed)
Andrew Lyons , Thank you for taking time to come for your Medicare Wellness Visit. I appreciate your ongoing commitment to your health goals. Please review the following plan we discussed and let me know if I can assist you in the future.   Screening recommendations/referrals: Colonoscopy: 03/26/20 Recommended yearly ophthalmology/optometry visit for glaucoma screening and checkup Recommended yearly dental visit for hygiene and checkup  Vaccinations: Influenza vaccine: 06/08/21 Pneumococcal vaccine: 08/14/14 Tdap vaccine: 06/09/10, due Shingles vaccine: n/d   Covid-19: 10/09/19, 10/30/19, 09/01/20 (3 boosters have been done since last shot per patient)  Advanced directives: no  Conditions/risks identified: none   Next appointment: Follow up in one year for your annual wellness visit. 10/19/22 @ 3pm in person  Preventive Care 65 Years and Older, Male Preventive care refers to lifestyle choices and visits with your health care provider that can promote health and wellness. What does preventive care include? A yearly physical exam. This is also called an annual well check. Dental exams once or twice a year. Routine eye exams. Ask your health care provider how often you should have your eyes checked. Personal lifestyle choices, including: Daily care of your teeth and gums. Regular physical activity. Eating a healthy diet. Avoiding tobacco and drug use. Limiting alcohol use. Practicing safe sex. Taking low doses of aspirin every day. Taking vitamin and mineral supplements as recommended by your health care provider. What happens during an annual well check? The services and screenings done by your health care provider during your annual well check will depend on your age, overall health, lifestyle risk factors, and family history of disease. Counseling  Your health care provider may ask you questions about your: Alcohol use. Tobacco use. Drug use. Emotional well-being. Home and  relationship well-being. Sexual activity. Eating habits. History of falls. Memory and ability to understand (cognition). Work and work Statistician. Screening  You may have the following tests or measurements: Height, weight, and BMI. Blood pressure. Lipid and cholesterol levels. These may be checked every 5 years, or more frequently if you are over 48 years old. Skin check. Lung cancer screening. You may have this screening every year starting at age 86 if you have a 30-pack-year history of smoking and currently smoke or have quit within the past 15 years. Fecal occult blood test (FOBT) of the stool. You may have this test every year starting at age 66. Flexible sigmoidoscopy or colonoscopy. You may have a sigmoidoscopy every 5 years or a colonoscopy every 10 years starting at age 69. Prostate cancer screening. Recommendations will vary depending on your family history and other risks. Hepatitis C blood test. Hepatitis B blood test. Sexually transmitted disease (STD) testing. Diabetes screening. This is done by checking your blood sugar (glucose) after you have not eaten for a while (fasting). You may have this done every 1-3 years. Abdominal aortic aneurysm (AAA) screening. You may need this if you are a current or former smoker. Osteoporosis. You may be screened starting at age 5 if you are at high risk. Talk with your health care provider about your test results, treatment options, and if necessary, the need for more tests. Vaccines  Your health care provider may recommend certain vaccines, such as: Influenza vaccine. This is recommended every year. Tetanus, diphtheria, and acellular pertussis (Tdap, Td) vaccine. You may need a Td booster every 10 years. Zoster vaccine. You may need this after age 38. Pneumococcal 13-valent conjugate (PCV13) vaccine. One dose is recommended after age 29. Pneumococcal polysaccharide (PPSV23) vaccine. One  dose is recommended after age 38. Talk to your  health care provider about which screenings and vaccines you need and how often you need them. This information is not intended to replace advice given to you by your health care provider. Make sure you discuss any questions you have with your health care provider. Document Released: 10/09/2015 Document Revised: 06/01/2016 Document Reviewed: 07/14/2015 Elsevier Interactive Patient Education  2017 [place] Prevention in the Home Falls can cause injuries. They can happen to people of all ages. There are many things you can do to make your home safe and to help prevent falls. What can I do on the outside of my home? Regularly fix the edges of walkways and driveways and fix any cracks. Remove anything that might make you trip as you walk through a door, such as a raised step or threshold. Trim any bushes or trees on the path to your home. Use bright outdoor lighting. Clear any walking paths of anything that might make someone trip, such as rocks or tools. Regularly check to see if handrails are loose or broken. Make sure that both sides of any steps have handrails. Any raised decks and porches should have guardrails on the edges. Have any leaves, snow, or ice cleared regularly. Use sand or salt on walking paths during winter. Clean up any spills in your garage right away. This includes oil or grease spills. What can I do in the bathroom? Use night lights. Install grab bars by the toilet and in the tub and shower. Do not use towel bars as grab bars. Use non-skid mats or decals in the tub or shower. If you need to sit down in the shower, use a plastic, non-slip stool. Keep the floor dry. Clean up any water that spills on the floor as soon as it happens. Remove soap buildup in the tub or shower regularly. Attach bath mats securely with double-sided non-slip rug tape. Do not have throw rugs and other things on the floor that can make you trip. What can I do in the bedroom? Use night  lights. Make sure that you have a light by your bed that is easy to reach. Do not use any sheets or blankets that are too big for your bed. They should not hang down onto the floor. Have a firm chair that has side arms. You can use this for support while you get dressed. Do not have throw rugs and other things on the floor that can make you trip. What can I do in the kitchen? Clean up any spills right away. Avoid walking on wet floors. Keep items that you use a lot in easy-to-reach places. If you need to reach something above you, use a strong step stool that has a grab bar. Keep electrical cords out of the way. Do not use floor polish or wax that makes floors slippery. If you must use wax, use non-skid floor wax. Do not have throw rugs and other things on the floor that can make you trip. What can I do with my stairs? Do not leave any items on the stairs. Make sure that there are handrails on both sides of the stairs and use them. Fix handrails that are broken or loose. Make sure that handrails are as long as the stairways. Check any carpeting to make sure that it is firmly attached to the stairs. Fix any carpet that is loose or worn. Avoid having throw rugs at the top or bottom of the  stairs. If you do have throw rugs, attach them to the floor with carpet tape. Make sure that you have a light switch at the top of the stairs and the bottom of the stairs. If you do not have them, ask someone to add them for you. What else can I do to help prevent falls? Wear shoes that: Do not have high heels. Have rubber bottoms. Are comfortable and fit you well. Are closed at the toe. Do not wear sandals. If you use a stepladder: Make sure that it is fully opened. Do not climb a closed stepladder. Make sure that both sides of the stepladder are locked into place. Ask someone to hold it for you, if possible. Clearly mark and make sure that you can see: Any grab bars or handrails. First and last  steps. Where the edge of each step is. Use tools that help you move around (mobility aids) if they are needed. These include: Canes. Walkers. Scooters. Crutches. Turn on the lights when you go into a dark area. Replace any light bulbs as soon as they burn out. Set up your furniture so you have a clear path. Avoid moving your furniture around. If any of your floors are uneven, fix them. If there are any pets around you, be aware of where they are. Review your medicines with your doctor. Some medicines can make you feel dizzy. This can increase your chance of falling. Ask your doctor what other things that you can do to help prevent falls. This information is not intended to replace advice given to you by your health care provider. Make sure you discuss any questions you have with your health care provider. Document Released: 07/09/2009 Document Revised: 02/18/2016 Document Reviewed: 10/17/2014 Elsevier Interactive Patient Education  2017 [employer].

## 2021-10-25 ENCOUNTER — Other Ambulatory Visit: Payer: Self-pay

## 2021-10-25 ENCOUNTER — Encounter: Payer: Self-pay | Admitting: Dermatology

## 2021-10-25 ENCOUNTER — Ambulatory Visit: Payer: PPO | Admitting: Dermatology

## 2021-10-25 DIAGNOSIS — L821 Other seborrheic keratosis: Secondary | ICD-10-CM

## 2021-10-25 DIAGNOSIS — Z86018 Personal history of other benign neoplasm: Secondary | ICD-10-CM

## 2021-10-25 DIAGNOSIS — Z1283 Encounter for screening for malignant neoplasm of skin: Secondary | ICD-10-CM

## 2021-10-25 DIAGNOSIS — L578 Other skin changes due to chronic exposure to nonionizing radiation: Secondary | ICD-10-CM | POA: Diagnosis not present

## 2021-10-25 DIAGNOSIS — L304 Erythema intertrigo: Secondary | ICD-10-CM

## 2021-10-25 DIAGNOSIS — D229 Melanocytic nevi, unspecified: Secondary | ICD-10-CM

## 2021-10-25 DIAGNOSIS — L814 Other melanin hyperpigmentation: Secondary | ICD-10-CM

## 2021-10-25 DIAGNOSIS — D1800 Hemangioma unspecified site: Secondary | ICD-10-CM | POA: Diagnosis not present

## 2021-10-25 NOTE — Progress Notes (Signed)
Follow-Up Visit   Subjective  Andrew Lyons is a 76 y.o. male who presents for the following: Annual Exam (Here for skin cancer screening. Hx of DN. No personal Hx of skin cancer. ). The patient presents for Total-Body Skin Exam (TBSE) for skin cancer screening and mole check.  The patient has spots, moles and lesions to be evaluated, some may be new or changing and the patient has concerns that these could be cancer.  The following portions of the chart were reviewed this encounter and updated as appropriate:  Tobacco   Allergies   Meds   Problems   Med Hx   Surg Hx   Fam Hx      Review of Systems: No other skin or systemic complaints except as noted in HPI or Assessment and Plan.  Objective  Well appearing patient in no apparent distress; mood and affect are within normal limits.  A full examination was performed including scalp, head, eyes, ears, nose, lips, neck, chest, axillae, abdomen, back, buttocks, bilateral upper extremities, bilateral lower extremities, hands, feet, fingers, toes, fingernails, and toenails. All findings within normal limits unless otherwise noted below.  B/L axillae Erythema with maceration   Assessment & Plan   Lentigines - Scattered tan macules - Due to sun exposure - Benign-appearing, observe - Recommend daily broad spectrum sunscreen SPF 30+ to sun-exposed areas, reapply every 2 hours as needed. - Call for any changes  Seborrheic Keratoses - Stuck-on, waxy, tan-brown papules and/or plaques  - Benign-appearing - Discussed benign etiology and prognosis. - Observe - Call for any changes  Melanocytic Nevi - Tan-brown and/or pink-flesh-colored symmetric macules and papules - Benign appearing on exam today - Observation - Call clinic for new or changing moles - Recommend daily use of broad spectrum spf 30+ sunscreen to sun-exposed areas.   Hemangiomas - Red papules - Discussed benign nature - Observe - Call for any changes  Actinic  Damage - Chronic condition, secondary to cumulative UV/sun exposure - diffuse scaly erythematous macules with underlying dyspigmentation - Recommend daily broad spectrum sunscreen SPF 30+ to sun-exposed areas, reapply every 2 hours as needed.  - Staying in the shade or wearing long sleeves, sun glasses (UVA+UVB protection) and wide brim hats (4-inch brim around the entire circumference of the hat) are also recommended for sun protection.  - Call for new or changing lesions.  History of Dysplastic Nevi - No evidence of recurrence today on back - Recommend regular full body skin exams - Recommend daily broad spectrum sunscreen SPF 30+ to sun-exposed areas, reapply every 2 hours as needed.  - Call if any new or changing lesions are noted between office visits   Skin cancer screening performed today.  Intertrigo B/L axillae  Start [facility] Intertrigo Cream: Iodoquinol: 1%, Hydrocortisone: 2.5%, Niacinamide: 2% Twice daily as needed.  Instructions for [facility] Medications  One or more of your medications was sent to the [facility] mail order compounding pharmacy. You will receive an email from them and can purchase the medicine through that link. It will then be mailed to your home at the address you confirmed. If for any reason you do not receive an email from them, please check your spam folder. If you still do not find the email, please let us know. [facility] phone number is [phone].   Skin cancer screening   Return in about 1 year (around 10/25/2022) for TBSE.  I, Emelia Salisbury, CMA, am acting as scribe for Sarina Ser, MD.  Documentation: I have reviewed the above documentation for accuracy and completeness, and I agree with the above.  Sarina Ser, MD

## 2021-10-25 NOTE — Patient Instructions (Addendum)
Instructions for [facility] Medications  One or more of your medications was sent to the [facility] mail order compounding pharmacy. You will receive an email from them and can purchase the medicine through that link. It will then be mailed to your home at the address you confirmed. If for any reason you do not receive an email from them, please check your spam folder. If you still do not find the email, please let us know. [facility] phone number is [phone].    Start [facility] Intertrigo Cream: Iodoquinol: 1%, Hydrocortisone: 2.5%, Niacinamide: 2% Twice daily as needed to underarms.  Melanoma ABCDEs  Melanoma is the most dangerous type of skin cancer, and is the leading cause of death from skin disease.  You are more likely to develop melanoma if you: Have light-colored skin, light-colored eyes, or red or blond hair Spend a lot of time in the sun Tan regularly, either outdoors or in a tanning bed Have had blistering sunburns, especially during childhood Have a close family member who has had a melanoma Have atypical moles or large birthmarks  Early detection of melanoma is key since treatment is typically straightforward and cure rates are extremely high if we catch it early.   The first sign of melanoma is often a change in a mole or a new dark spot.  The ABCDE system is a way of remembering the signs of melanoma.  A for asymmetry:  The two halves do not match. B for border:  The edges of the growth are irregular. C for color:  A mixture of colors are present instead of an even brown color. D for diameter:  Melanomas are usually (but not always) greater than 27mm - the size of a pencil eraser. E for evolution:  The spot keeps changing in size, shape, and color.  Please check your skin once per month between visits. You can use a small mirror in front and a large mirror behind you to keep an eye on the back side or your body.   If you see any new or changing  lesions before your next follow-up, please call to schedule a visit.  Please continue daily skin protection including broad spectrum sunscreen SPF 30+ to sun-exposed areas, reapplying every 2 hours as needed when you're outdoors.   Staying in the shade or wearing long sleeves, sun glasses (UVA+UVB protection) and wide brim hats (4-inch brim around the entire circumference of the hat) are also recommended for sun protection.    If You Need Anything After Your Visit  If you have any questions or concerns for your doctor, please call our main line at [phone] and press option 4 to reach your doctor's medical assistant. If no one answers, please leave a voicemail as directed and we will return your call as soon as possible. Messages left after 4 pm will be answered the following business day.   You may also send [place] a message via [place]. We typically respond to MyChart messages within 1-2 business days.  For prescription refills, please ask your pharmacy to contact our office. Our fax number is [phone].  If you have an urgent issue when the clinic is closed that cannot wait until the next business day, you can page your doctor at the number below.    Please note that while we do our best to be available for urgent issues outside of office hours, we are not available 24/7.   If you have an urgent issue and are  unable to reach [place], you may choose to seek medical care at your doctor's office, retail clinic, urgent care center, or emergency room.  If you have a medical emergency, please immediately call 911 or go to the emergency department.  Pager Numbers  - Dr. [name]: [phone]  - Dr. Laurence Ferrari: [phone]  - Dr. Nicole Kindred: [phone]  In the event of inclement weather, please call our main line at [phone] for an update on the status of any delays or closures.  Dermatology Medication Tips: Please keep the boxes that topical medications come in in order to help keep track of  the instructions about where and how to use these. Pharmacies typically print the medication instructions only on the boxes and not directly on the medication tubes.   If your medication is too expensive, please contact our office at [phone] option 4 or send [place] a message through [place].   We are unable to tell what your co-pay for medications will be in advance as this is different depending on your insurance coverage. However, we may be able to find a substitute medication at lower cost or fill out paperwork to get insurance to cover a needed medication.   If a prior authorization is required to get your medication covered by your insurance company, please allow [place] 1-2 business days to complete this process.  Drug prices often vary depending on where the prescription is filled and some pharmacies may offer cheaper prices.  The website www.goodrx.com contains coupons for medications through different pharmacies. The prices here do not account for what the cost may be with help from insurance (it may be cheaper with your insurance), but the website can give you the price if you did not use any insurance.  - You can print the associated coupon and take it with your prescription to the pharmacy.  - You may also stop by our office during regular business hours and pick up a GoodRx coupon card.  - If you need your prescription sent electronically to a different pharmacy, notify our office through [facility] or by phone at [phone] option 4.     Si Usted Necesita Algo Despus de Su Visita  Tambin puede enviarnos un mensaje a travs de Pharmacist, community. Por lo general respondemos a los mensajes de MyChart en el transcurso de 1 a 2 das hbiles.  Para renovar recetas, por favor pida a su farmacia que se ponga en contacto con nuestra oficina. Harland Dingwall de fax es [place] [phone].  Si tiene un asunto urgente cuando la clnica est cerrada y que no puede esperar hasta el siguiente da  hbil, puede llamar/localizar a su doctor(a) al nmero que aparece a continuacin.   Por favor, tenga en cuenta que aunque hacemos todo lo posible para estar disponibles para asuntos urgentes fuera del horario de [place], no estamos disponibles las 24 horas del da, los 7 das de la [place].   Si tiene un problema urgente y no puede comunicarse con nosotros, puede optar por buscar atencin mdica  en el consultorio de su doctor(a), en una clnica privada, en un centro de atencin urgente o en una sala de emergencias.  Si tiene Engineering geologist, por favor llame inmediatamente al 911 o vaya a la sala de emergencias.  Nmeros de bper  - Dr. [name]: [phone]  - Dra. Moye: [phone]  - Dra. Nicole Kindred: [phone]  En caso de inclemencias del [place], por favor llame a Johnsie Kindred principal al [phone] para una actualizacin sobre el [place]  de cualquier retraso o cierre.  Consejos para la medicacin en dermatologa: Por favor, guarde las cajas en las que vienen los medicamentos de uso tpico para ayudarle a seguir las instrucciones sobre dnde y cmo usarlos. Las farmacias generalmente imprimen las instrucciones del medicamento slo en las cajas y no directamente en los tubos del [place].   Si su medicamento es muy caro, por favor, pngase en contacto con Zigmund Daniel llamando al [phone] y presione la opcin 4 o envenos un mensaje a travs de Pharmacist, community.   No podemos decirle cul ser su copago por los medicamentos por adelantado ya que esto es diferente dependiendo de la cobertura de su seguro. Sin embargo, es posible que podamos encontrar un medicamento sustituto a Electrical engineer un formulario para que el seguro cubra el medicamento que se considera necesario.   Si se requiere una autorizacin previa para que su compaa de seguros [place] su medicamento, por favor permtanos de 1 a 2 das hbiles para completar este proceso.  Los precios de los medicamentos  varan con frecuencia dependiendo del Environmental consultant de dnde se surte la receta y alguna farmacias pueden ofrecer precios ms baratos.  El sitio web www.goodrx.com tiene cupones para medicamentos de Airline pilot. Los precios aqu no tienen en cuenta lo que podra costar con la ayuda del seguro (puede ser ms barato con su seguro), pero el sitio web puede darle el precio si no utiliz Research scientist (physical sciences).  - Puede imprimir el cupn correspondiente y llevarlo con su receta a la farmacia.  - Tambin puede pasar por nuestra oficina durante el horario de atencin regular y Charity fundraiser una tarjeta de cupones de GoodRx.  - Si necesita que su receta se enve electrnicamente a una farmacia diferente, informe a nuestra oficina a travs de MyChart de [place] o por telfono llamando al [phone] y presione la opcin 4.

## 2021-10-26 ENCOUNTER — Encounter: Payer: Self-pay | Admitting: Dermatology

## 2021-11-09 ENCOUNTER — Telehealth: Payer: Self-pay

## 2021-11-09 NOTE — Telephone Encounter (Signed)
Copied from [place] [id number]. Topic: General - Inquiry >> Nov 09, 2021  9:27 AM Loma [place] wrote: Pt has an appt on 3/6, states before Covid he had discussed with Dr Darnell Level a replacement for his 76 yr old breathing machine. States it his all in his notes. Suggested an appt but wants to deal with Dr Darnell Level and ask for a special request due to availability of appt with Rosanna Randy. FU if possible before his appt on 3/6 for script for  Request FU [phone]

## 2021-11-09 NOTE — Telephone Encounter (Signed)
Please advise 

## 2021-11-15 NOTE — Telephone Encounter (Signed)
Order written and waiting to be signed.

## 2021-11-17 NOTE — Telephone Encounter (Signed)
Returned call to patient. Need to know where order is being faxed to. Patient stated he will cb tomorrow an give [place] that info.

## 2021-11-19 NOTE — Telephone Encounter (Signed)
Patient called stated that the order need to be sent to  [facility] intake team Fax # [phone]

## 2021-11-29 ENCOUNTER — Other Ambulatory Visit: Payer: Self-pay

## 2021-11-29 ENCOUNTER — Encounter: Payer: Self-pay | Admitting: Family Medicine

## 2021-11-29 ENCOUNTER — Ambulatory Visit (INDEPENDENT_AMBULATORY_CARE_PROVIDER_SITE_OTHER): Payer: PPO | Admitting: Family Medicine

## 2021-11-29 VITALS — BP 169/77 | HR 71 | Resp 16 | Wt 258.0 lb

## 2021-11-29 DIAGNOSIS — E1159 Type 2 diabetes mellitus with other circulatory complications: Secondary | ICD-10-CM

## 2021-11-29 DIAGNOSIS — D692 Other nonthrombocytopenic purpura: Secondary | ICD-10-CM | POA: Diagnosis not present

## 2021-11-29 DIAGNOSIS — Z6837 Body mass index (BMI) 37.0-37.9, adult: Secondary | ICD-10-CM

## 2021-11-29 DIAGNOSIS — I10 Essential (primary) hypertension: Secondary | ICD-10-CM | POA: Diagnosis not present

## 2021-11-29 DIAGNOSIS — Z89511 Acquired absence of right leg below knee: Secondary | ICD-10-CM | POA: Diagnosis not present

## 2021-11-29 DIAGNOSIS — G4733 Obstructive sleep apnea (adult) (pediatric): Secondary | ICD-10-CM | POA: Diagnosis not present

## 2021-11-29 DIAGNOSIS — I2510 Atherosclerotic heart disease of native coronary artery without angina pectoris: Secondary | ICD-10-CM

## 2021-11-29 LAB — POCT GLYCOSYLATED HEMOGLOBIN (HGB A1C)
Est. average glucose Bld gHb Est-mCnc: 160
Hemoglobin A1C: 7.2 % — AB (ref 4.0–5.6)

## 2021-11-29 NOTE — Progress Notes (Signed)
?  ? ? ?Established patient visit ? ?I,[name],acting as a scribe for [name], MD.,have documented all relevant documentation on the behalf of [name], MD,as directed by  [name], MD while in the presence of [name], MD. ? ? ?Patient: Andrew Lyons   DOB: 1945-12-18   76 y.o. Male  MRN: [id number] ?Visit Date: 11/29/2021 ? ?Today's healthcare provider: [name], MD  ? ?Chief Complaint  ?Patient presents with  ? Follow-up  ? Diabetes  ? Hypertension  ? ?Subjective  ?  ?HPI  ?Patient comes in today for follow-up.  He has been feeling fairly well and continues to try to move about. 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 ?Has no complaints today and is tolerating his medications well. ?Diabetes Mellitus Type II, follow-up ? ?Lab Results  ?Component Value Date  ? HGBA1C 7.2 (A) 11/29/2021  ? HGBA1C 7.2 (H) 06/09/2021  ? HGBA1C 6.7 (A) 01/18/2021  ? ?Last seen for diabetes 5 months ago.  ?Management since then includes continuing the same treatment. ?He reports good compliance with treatment. ?He is not having side effects. none ? ?Home blood sugar records: fasting range: not checking ? ?Episodes of hypoglycemia? No none ?  ?Current insulin regiment: Victoza 1.28m  ?Most Recent Eye Exam: 06/08/2020 ? ?--------------------------------------------------------------------------------------------------- ?Hypertension, follow-up ? ?BP Readings from Last 3 Encounters:  ?11/29/21 (!) 169/77  ?10/18/21 128/70  ?08/10/21 (!) 161/69  ? Wt Readings from Last 3 Encounters:  ?11/29/21 258 lb (117 kg)  ?10/18/21 258 lb 9.6 oz (117.3 kg)  ?08/10/21 265 lb (120.2 kg)  ?  ? ?He was last seen for hypertension 5 months ago.  ?BP at that visit was 123/72. ?Management since that visit includes; on metoprolol and amlodipine-benazepril.  ?He reports good compliance with treatment. ?He is not having side effects. none ?He is not exercising. ?He is adherent to low salt  diet.   ?Outside blood pressures are not being checked.. ? ?He does not smoke. ? ?Use of agents associated with hypertension: none.  ? ?--------------------------------------------------------------------------------------------------- ? ? ?Medications: ?Outpatient Medications Prior to Visit  ?Medication Sig  ? amLODipine-benazepril (LOTREL) 5-40 MG capsule TAKE 1 CAPSULE BY MOUTH ONCE DAILY  ? aspirin 81 MG chewable tablet Chew by mouth daily.  ? B-D UF III MINI PEN NEEDLES 31G X 5 MM MISC USE WITH PEN DAILY  ? Blood Glucose Monitoring Suppl (ONE TOUCH ULTRA SYSTEM KIT) w/Device KIT To use daily to check sugar. DX E11.9-needs one touch ultra meter  ? cholecalciferol (VITAMIN D) 25 MCG (1000 UT) tablet Take 1,000 Units by mouth daily.   ? Coenzyme Q10 100 MG TABS Take 100 mg by mouth daily.   ? fluticasone (FLONASE) 50 MCG/ACT nasal spray USE 2 SPRAYS INTO EACH NOSTRIL EVERY DAY  ? ibuprofen (ADVIL) 800 MG tablet Take 1 tablet (800 mg total) by mouth every 8 (eight) hours as needed for mild pain or moderate pain.  ? JARDIANCE 25 MG TABS tablet TAKE 1 TABLET BY MOUTH ONCE DAILY  ? Lancets (ONETOUCH ULTRASOFT) lancets USE 1  TO CHECK GLUCOSE THREE TIMES DAILY  ? metFORMIN (GLUCOPHAGE) 500 MG tablet TAKE 1 TABLET BY MOUTH TWICE A DAY WITH A MEAL  ? metoprolol succinate (TOPROL-XL) 100 MG 24 hr tablet TAKE 1 TABLET BY MOUTH ONCE A DAY WITH OR IMMEDIATELY FOLLOWING A MEAL  ? montelukast (SINGULAIR) 10 MG tablet TAKE 1 TABLET BY MOUTH ONCE A DAY  ?  omeprazole (PRILOSEC) 40 MG capsule TAKE 1 CAPSULE BY MOUTH ONCE DAILY  ? ONETOUCH ULTRA test strip USE 1 STRIP TO CHECK GLUCOSE TWICE DAILY AS NEEDED  ? rosuvastatin (CRESTOR) 20 MG tablet TAKE 1 TABLET BY MOUTH ONCE A DAY  ? sildenafil (VIAGRA) 100 MG tablet Take 50 mg by mouth daily as needed for erectile dysfunction.  ? tadalafil (CIALIS) 5 MG tablet TAKE ONE TABLET BY MOUTH DAILY AS NEEDED FOR ERECTILE DYSFUNCTION  ? VICTOZA 18 MG/3ML SOPN INJECT 0.3 MLS (1.8 MG) UNDER THE  SKIN ONCE DAILY  ? vitamin B-12 (CYANOCOBALAMIN) 1000 MCG tablet Take 1,000 mcg by mouth daily.  ? [DISCONTINUED] clopidogrel (PLAVIX) 75 MG tablet TAKE 1 TABLET BY MOUTH ONCE A DAY (Patient not taking: Reported on 11/29/2021)  ? ?No facility-administered medications prior to visit.  ? ? ?Review of Systems ? ?Last hemoglobin A1c ?Lab Results  ?Component Value Date  ? HGBA1C 7.2 (A) 11/29/2021  ? ?  ?  Objective  ?  ?BP (!) 169/77 (BP Location: Left Arm, Patient Position: Sitting, Cuff Size: Large)   Pulse 71   Resp 16   Wt 258 lb (117 kg)   SpO2 96%   BMI 37.02 kg/m?  ?BP Readings from Last 3 Encounters:  ?11/29/21 (!) 169/77  ?10/18/21 128/70  ?08/10/21 (!) 161/69  ? ?Wt Readings from Last 3 Encounters:  ?11/29/21 258 lb (117 kg)  ?10/18/21 258 lb 9.6 oz (117.3 kg)  ?08/10/21 265 lb (120.2 kg)  ? ?  ? ?Physical Exam ?Vitals reviewed.  ?Constitutional:   ?   Appearance: He is well-developed. He is obese.  ?HENT:  ?   Head: Normocephalic and atraumatic.  ?   Right Ear: External ear normal.  ?   Left Ear: External ear normal.  ?   Nose: Nose normal.  ?Eyes:  ?   General: No scleral icterus. ?   Conjunctiva/sclera: Conjunctivae normal.  ?Neck:  ?   Thyroid: No thyromegaly.  ?Cardiovascular:  ?   Rate and Rhythm: Normal rate and regular rhythm.  ?   Heart sounds: Normal heart sounds.  ?Pulmonary:  ?   Effort: Pulmonary effort is normal.  ?   Breath sounds: Normal breath sounds.  ?Abdominal:  ?   Palpations: Abdomen is soft.  ?Musculoskeletal:  ?   Comments: S/p Right BKA.    ?Skin: ?   General: Skin is warm and dry.  ?   Comments: Some senile purpura of both arms and hands  ?Neurological:  ?   General: No focal deficit present.  ?   Mental Status: He is alert and oriented to person, place, and time. Mental status is at baseline.  ?Psychiatric:     ?   Mood and Affect: Mood normal.     ?   Behavior: Behavior normal.     ?   Thought Content: Thought content normal.     ?   Judgment: Judgment normal.  ?  ? ? ?Results  for orders placed or performed in visit on 11/29/21  ?POCT glycosylated hemoglobin (Hb A1C)  ?Result Value Ref Range  ? Hemoglobin A1C 7.2 (A) 4.0 - 5.6 %  ? Est. average glucose Bld gHb Est-mCnc 160   ? ? Assessment & Plan  ?  ? ?1. Type 2 diabetes mellitus with other circulatory complication, without long-term current use of insulin ([place]) ?1C is steady at 7.2 which is exactly the same as it was last time.  No change in medications. ?- POCT  glycosylated hemoglobin (Hb A1C) ?Also rosuvastatin 20 ?2. Benign essential HTN ?He is on Lotrel 5/40.  Consider increasing to 10/40 with 2 5/20 tablets ? ?3. Senile purpura ([place]) ? ? ?4. Hx of BKA, right ([place]) ? ? ?5. Class 2 severe obesity due to excess calories with serious comorbidity and body mass index (BMI) of 37.0 to 37.9 in adult [facility]) ?Advised patient to continue to move ? ?6. Obstructive apnea ?On CPAP nightly. ?7.  CAD ?All risk factors treated ? ?No follow-ups on file.  ?   ? ?I, [name], MD, have reviewed all documentation for this visit. The documentation on 11/30/21 for the exam, diagnosis, procedures, and orders are all accurate and complete. ? ? ? ?Elnoria Livingston [name], MD  ?[facility] ?[phone] (phone) ?[phone] (fax) ? ?[facility] ?

## 2021-11-30 NOTE — Telephone Encounter (Signed)
Faxed

## 2021-12-07 DIAGNOSIS — I25118 Atherosclerotic heart disease of native coronary artery with other forms of angina pectoris: Secondary | ICD-10-CM | POA: Diagnosis not present

## 2021-12-07 DIAGNOSIS — Q231 Congenital insufficiency of aortic valve: Secondary | ICD-10-CM | POA: Diagnosis not present

## 2021-12-07 DIAGNOSIS — Z6837 Body mass index (BMI) 37.0-37.9, adult: Secondary | ICD-10-CM | POA: Diagnosis not present

## 2021-12-07 DIAGNOSIS — E782 Mixed hyperlipidemia: Secondary | ICD-10-CM | POA: Diagnosis not present

## 2021-12-07 DIAGNOSIS — I739 Peripheral vascular disease, unspecified: Secondary | ICD-10-CM | POA: Diagnosis not present

## 2021-12-07 DIAGNOSIS — I10 Essential (primary) hypertension: Secondary | ICD-10-CM | POA: Diagnosis not present

## 2021-12-07 DIAGNOSIS — I6523 Occlusion and stenosis of bilateral carotid arteries: Secondary | ICD-10-CM | POA: Diagnosis not present

## 2021-12-09 ENCOUNTER — Other Ambulatory Visit: Payer: Self-pay | Admitting: Family Medicine

## 2021-12-09 NOTE — Telephone Encounter (Signed)
Requested Prescriptions  ?Pending Prescriptions Disp Refills  ?? VICTOZA 18 MG/3ML SOPN [Pharmacy Med Name: VICTOZA 18 MG/3ML SUBQ SOLN ML] 9 mL 5  ?  Sig: INJECT 0.3 MLS (1.8 MG) UNDER THE SKIN ONCE DAILY  ?  ? Endocrinology:  Diabetes - GLP-1 Receptor Agonists Passed - 12/09/2021  2:05 PM  ?  ?  Passed - HBA1C is between 0 and 7.9 and within 180 days  ?  Hemoglobin A1C  ?Date Value Ref Range Status  ?11/29/2021 7.2 (A) 4.0 - 5.6 % Final  ? ?Hgb A1c MFr Bld  ?Date Value Ref Range Status  ?06/09/2021 7.2 (H) 4.8 - 5.6 % Final  ?  Comment:  ?           Prediabetes: 5.7 - 6.4 ?         Diabetes: >6.4 ?         Glycemic control for adults with diabetes: <7.0 ?  ?   ?  ?  Passed - Valid encounter within last 6 months  ?  Recent Outpatient Visits   ?      ? 1 week ago Type 2 diabetes mellitus with other circulatory complication, without long-term current use of insulin ([place])  ? [facility] [name]., MD  ? 6 months ago Annual physical exam  ? [facility] [name]., MD  ? 10 months ago Type 2 diabetes mellitus with other circulatory complication, without long-term current use of insulin ([place])  ? [facility] [name]., MD  ? 1 year ago Type 2 diabetes mellitus with other circulatory complication, without long-term current use of insulin ([place])  ? [facility] [name]., MD  ? 1 year ago Type 2 diabetes mellitus with other circulatory complication, without long-term current use of insulin ([place])  ? [facility] [name]., MD  ?  ?  ?Future Appointments   ?        ? In 4 months [name]., MD [facility]  ? In 6 months [name]., MD [facility]  ? In 10 months Ralene Bathe, MD [place]  ?  ? ?  ?  ?  ? ? ?

## 2022-01-06 ENCOUNTER — Telehealth: Payer: Self-pay

## 2022-01-06 NOTE — Telephone Encounter (Signed)
Copied from [place] [id number]. Topic: General - Other ?>> Jan 06, 2022 11:25 AM Tessa Lerner A wrote: ?Reason for CRM: The patient's wife has called to request a follow up with a member of staff related to the patient's CPAP orders/requests ? ?The patient's wife has been in contact with the medical supply company that would potentially provide the new CPAP machine and has been told that no additional information has been submitted for the patient  ? ?The patient's wife would like to discuss further with a member of staff when possible  ? ?Please contact when available ?

## 2022-01-07 ENCOUNTER — Other Ambulatory Visit: Payer: Self-pay | Admitting: Family Medicine

## 2022-01-31 NOTE — Telephone Encounter (Signed)
Advised wife that the patient would need a face to face in order to schedule a sleep study before we can order a CPAP for him.  ?

## 2022-02-07 ENCOUNTER — Ambulatory Visit (INDEPENDENT_AMBULATORY_CARE_PROVIDER_SITE_OTHER): Payer: PPO | Admitting: Family Medicine

## 2022-02-07 VITALS — BP 146/60 | HR 66 | Wt 258.0 lb

## 2022-02-07 DIAGNOSIS — G4733 Obstructive sleep apnea (adult) (pediatric): Secondary | ICD-10-CM | POA: Diagnosis not present

## 2022-02-07 DIAGNOSIS — Z6837 Body mass index (BMI) 37.0-37.9, adult: Secondary | ICD-10-CM | POA: Diagnosis not present

## 2022-02-07 DIAGNOSIS — E1159 Type 2 diabetes mellitus with other circulatory complications: Secondary | ICD-10-CM

## 2022-02-07 DIAGNOSIS — I2510 Atherosclerotic heart disease of native coronary artery without angina pectoris: Secondary | ICD-10-CM | POA: Diagnosis not present

## 2022-02-07 NOTE — Progress Notes (Signed)
Established patient visit   Patient: Andrew Lyons   DOB: Sep 14, 1946   76 y.o. Male  MRN: [id number] Visit Date: 02/07/2022  Today's healthcare provider: [name], MD   No chief complaint on file.  Subjective    HPI  Sleep apnea Patient is a 76 year old male who presents to get referral for sleep study.  He states his last sleep study was more than 10 years ago.  He had been using it up until the time his current machine broke.  He has known obstructive sleep apnea and we need to try to get him an auto titrating his CPAP machine or retested by the auto titrating machine makes a lot more sense. Medications: Outpatient Medications Prior to Visit  Medication Sig   amLODipine-benazepril (LOTREL) 5-40 MG capsule TAKE 1 CAPSULE BY MOUTH ONCE DAILY   aspirin 81 MG chewable tablet Chew by mouth daily.   B-D UF III MINI PEN NEEDLES 31G X 5 MM MISC USE WITH PEN DAILY   Blood Glucose Monitoring Suppl (ONE TOUCH ULTRA SYSTEM KIT) w/Device KIT To use daily to check sugar. DX E11.9-needs one touch ultra meter   cholecalciferol (VITAMIN D) 25 MCG (1000 UT) tablet Take 1,000 Units by mouth daily.    Coenzyme Q10 100 MG TABS Take 100 mg by mouth daily.    fluticasone (FLONASE) 50 MCG/ACT nasal spray USE 2 SPRAYS INTO EACH NOSTRIL EVERY DAY   ibuprofen (ADVIL) 800 MG tablet Take 1 tablet (800 mg total) by mouth every 8 (eight) hours as needed for mild pain or moderate pain.   JARDIANCE 25 MG TABS tablet TAKE 1 TABLET BY MOUTH ONCE DAILY   Lancets (ONETOUCH ULTRASOFT) lancets USE 1  TO CHECK GLUCOSE THREE TIMES DAILY   metFORMIN (GLUCOPHAGE) 500 MG tablet TAKE 1 TABLET BY MOUTH TWICE A DAY WITH A MEAL   metoprolol succinate (TOPROL-XL) 100 MG 24 hr tablet TAKE 1 TABLET BY MOUTH ONCE A DAY WITH OR IMMEDIATELY FOLLOWING A MEAL   montelukast (SINGULAIR) 10 MG tablet TAKE 1 TABLET BY MOUTH ONCE A DAY   omeprazole (PRILOSEC) 40 MG capsule TAKE 1 CAPSULE BY MOUTH ONCE DAILY   ONETOUCH  ULTRA test strip USE 1 STRIP TO CHECK GLUCOSE TWICE DAILY AS NEEDED   rosuvastatin (CRESTOR) 20 MG tablet TAKE 1 TABLET BY MOUTH ONCE A DAY   sildenafil (VIAGRA) 100 MG tablet Take 50 mg by mouth daily as needed for erectile dysfunction.   tadalafil (CIALIS) 5 MG tablet TAKE ONE TABLET BY MOUTH DAILY AS NEEDED FOR ERECTILE DYSFUNCTION   VICTOZA 18 MG/3ML SOPN INJECT 0.3 MLS (1.8 MG) UNDER THE SKIN ONCE DAILY   vitamin B-12 (CYANOCOBALAMIN) 1000 MCG tablet Take 1,000 mcg by mouth daily.   No facility-administered medications prior to visit.    Review of Systems  Respiratory:  Positive for shortness of breath and wheezing.   Cardiovascular:  Negative for chest pain, palpitations and leg swelling.   Last hemoglobin A1c Lab Results  Component Value Date   HGBA1C 7.2 (A) 11/29/2021       Objective    BP (!) 146/60 (BP Location: Right Arm, Patient Position: Sitting, Cuff Size: Normal)   Pulse 66   Wt 258 lb (117 kg)   SpO2 96%   BMI 37.02 kg/m  BP Readings from Last 3 Encounters:  02/07/22 (!) 146/60  11/29/21 (!) 169/77  10/18/21 128/70   Wt Readings from Last 3 Encounters:  02/07/22 258  lb (117 kg)  11/29/21 258 lb (117 kg)  10/18/21 258 lb 9.6 oz (117.3 kg)      Physical Exam Vitals reviewed.  Constitutional:      General: He is not in acute distress.    Appearance: He is well-developed.  HENT:     Head: Normocephalic and atraumatic.     Right Ear: Hearing normal.     Left Ear: Hearing normal.     Nose: Nose normal.  Eyes:     General: Lids are normal. No scleral icterus.       Right eye: No discharge.        Left eye: No discharge.     Conjunctiva/sclera: Conjunctivae normal.  Cardiovascular:     Rate and Rhythm: Normal rate and regular rhythm.     Heart sounds: Normal heart sounds.  Pulmonary:     Effort: Pulmonary effort is normal. No respiratory distress.  Skin:    Findings: No lesion or rash.  Neurological:     General: No focal deficit present.      Mental Status: He is alert and oriented to person, place, and time.  Psychiatric:        Mood and Affect: Mood normal.        Speech: Speech normal.        Behavior: Behavior normal.        Thought Content: Thought content normal.        Judgment: Judgment normal.      No results found for any visits on 02/07/22.  Assessment & Plan     1. Obstructive apnea Auto titrating CPAP machine or repeat CPAP titration study, known sleep apnea. - Home sleep test - Ambulatory referral to Sleep Studies  2. Class 2 severe obesity due to excess calories with serious comorbidity and body mass index (BMI) of 37.0 to 37.9 in adult [facility])   3. Atherosclerosis of native coronary artery of native heart without angina pectoris All risk factors treated  4. Type 2 diabetes mellitus with other circulatory complication, without long-term current use of insulin (HCC) Goal A1c less than 7-7.5   No follow-ups on file.      I, [name], MD, have reviewed all documentation for this visit. The documentation on 02/10/22 for the exam, diagnosis, procedures, and orders are all accurate and complete.    Calden Dorsey [name], MD  [facility] [phone] (phone) [phone] (fax)  [place]

## 2022-03-01 ENCOUNTER — Ambulatory Visit: Payer: PPO | Attending: Otolaryngology

## 2022-03-01 DIAGNOSIS — G4733 Obstructive sleep apnea (adult) (pediatric): Secondary | ICD-10-CM | POA: Insufficient documentation

## 2022-03-10 ENCOUNTER — Other Ambulatory Visit: Payer: Self-pay | Admitting: Family Medicine

## 2022-03-10 DIAGNOSIS — E118 Type 2 diabetes mellitus with unspecified complications: Secondary | ICD-10-CM

## 2022-03-10 DIAGNOSIS — E1159 Type 2 diabetes mellitus with other circulatory complications: Secondary | ICD-10-CM

## 2022-03-10 DIAGNOSIS — I10 Essential (primary) hypertension: Secondary | ICD-10-CM

## 2022-03-21 ENCOUNTER — Telehealth: Payer: Self-pay | Admitting: Family Medicine

## 2022-03-21 NOTE — Telephone Encounter (Signed)
[facility] faxed refill request for the following medications:  CLOPIDGREL BISULFATE 75 MG   Please advise.

## 2022-03-24 DIAGNOSIS — Z961 Presence of intraocular lens: Secondary | ICD-10-CM | POA: Diagnosis not present

## 2022-03-24 DIAGNOSIS — E119 Type 2 diabetes mellitus without complications: Secondary | ICD-10-CM | POA: Diagnosis not present

## 2022-03-24 DIAGNOSIS — H35361 Drusen (degenerative) of macula, right eye: Secondary | ICD-10-CM | POA: Diagnosis not present

## 2022-03-24 LAB — HM DIABETES EYE EXAM

## 2022-03-25 ENCOUNTER — Encounter: Payer: Self-pay | Admitting: Family Medicine

## 2022-04-07 ENCOUNTER — Other Ambulatory Visit: Payer: Self-pay | Admitting: Family Medicine

## 2022-04-07 DIAGNOSIS — N401 Enlarged prostate with lower urinary tract symptoms: Secondary | ICD-10-CM

## 2022-04-07 DIAGNOSIS — N529 Male erectile dysfunction, unspecified: Secondary | ICD-10-CM

## 2022-04-12 ENCOUNTER — Encounter: Payer: Self-pay | Admitting: Family Medicine

## 2022-04-12 ENCOUNTER — Ambulatory Visit (INDEPENDENT_AMBULATORY_CARE_PROVIDER_SITE_OTHER): Payer: PPO | Admitting: Family Medicine

## 2022-04-12 VITALS — BP 130/60 | HR 75 | Temp 98.4°F | Resp 16 | Ht 70.0 in | Wt 261.2 lb

## 2022-04-12 DIAGNOSIS — I10 Essential (primary) hypertension: Secondary | ICD-10-CM

## 2022-04-12 DIAGNOSIS — G4733 Obstructive sleep apnea (adult) (pediatric): Secondary | ICD-10-CM | POA: Diagnosis not present

## 2022-04-12 DIAGNOSIS — R0609 Other forms of dyspnea: Secondary | ICD-10-CM | POA: Diagnosis not present

## 2022-04-12 DIAGNOSIS — E1159 Type 2 diabetes mellitus with other circulatory complications: Secondary | ICD-10-CM | POA: Diagnosis not present

## 2022-04-12 DIAGNOSIS — I2510 Atherosclerotic heart disease of native coronary artery without angina pectoris: Secondary | ICD-10-CM | POA: Diagnosis not present

## 2022-04-12 DIAGNOSIS — E7800 Pure hypercholesterolemia, unspecified: Secondary | ICD-10-CM

## 2022-04-12 DIAGNOSIS — Z6837 Body mass index (BMI) 37.0-37.9, adult: Secondary | ICD-10-CM

## 2022-04-12 NOTE — Progress Notes (Unsigned)
Established patient visit  I,Joseline E Rosas,acting as a scribe for [name], MD.,have documented all relevant documentation on the behalf of [name], MD,as directed by  [name], MD while in the presence of [name], MD.   Patient: Andrew Lyons   DOB: 1946/06/14   76 y.o. Male  MRN: [id number] Visit Date: 04/12/2022  Today's healthcare provider: [name], MD   Chief Complaint  Patient presents with   Follow-up DM   Subjective    HPI  Patient comes in today for follow-up.  He is feeling well other than dyspnea on exertion which is worse in since the end of last year.  No chest pain but he does not have an appointment with cardiology until the end of the year.  He is having no syncope presyncope diaphoresis. He is not using his CPAP and is checking on his recent sleep study.  He has a CPAP at home. He has not attempted to exercise for a very long time.  Diabetes Mellitus Type II, Follow-up  Lab Results  Component Value Date   HGBA1C 7.2 (A) 11/29/2021   HGBA1C 7.2 (H) 06/09/2021   HGBA1C 6.7 (A) 01/18/2021   Wt Readings from Last 3 Encounters:  04/12/22 261 lb 3.2 oz (118.5 kg)  02/07/22 258 lb (117 kg)  11/29/21 258 lb (117 kg)   Last seen for diabetes 4 months ago.  Management since then includes none.  Symptoms: Yes fatigue No foot ulcerations  No appetite changes No nausea  No paresthesia of the feet  No polydipsia  No polyuria No visual disturbances   No vomiting SOB-with exertion    Home blood sugar records:  not being checked  Episodes of hypoglycemia? No not being checked   Current insulin regiment: none Most Recent Eye Exam: last month   Pertinent Labs: Lab Results  Component Value Date   CHOL 158 06/09/2021   HDL 33 (L) 06/09/2021   LDLCALC 74 06/09/2021   TRIG 316 (H) 06/09/2021   CHOLHDL 4.8 06/09/2021   Lab Results  Component Value Date   NA 140 06/09/2021   K 4.6 06/09/2021    CREATININE 1.29 (H) 06/09/2021   EGFR 58 (L) 06/09/2021   MICROALBUR 50 01/15/2020   LABMICR See below: 08/17/2015     --------------------------------------------------------------------------------------------------- Hypertension, follow-up  BP Readings from Last 3 Encounters:  04/12/22 130/60  02/07/22 (!) 146/60  11/29/21 (!) 169/77   Wt Readings from Last 3 Encounters:  04/12/22 261 lb 3.2 oz (118.5 kg)  02/07/22 258 lb (117 kg)  11/29/21 258 lb (117 kg)     He was last seen for hypertension 10 months ago.  BP at that visit was 169/77. Management since that visit includes Consider increasing to 10/40 with 2 5/20 tablets. He reports excellent compliance with treatment. He is not having side effects.  He is not exercising. Outside blood pressures are not being checked.  He does not smoke. --------------------------------------------------------------------------------------------------- Lipid/Cholesterol, follow-up  Last Lipid Panel: Lab Results  Component Value Date   CHOL 158 06/09/2021   LDLCALC 74 06/09/2021   HDL 33 (L) 06/09/2021   TRIG 316 (H) 06/09/2021   Patient on Crestor 20 mg. He reports excellent compliance with treatment. He is not having side effects.   Symptoms: No appetite changes No foot ulcerations  No chest pain No chest pressure/discomfort  No dyspnea No orthopnea  Yes fatigue No lower extremity edema  No palpitations No  paroxysmal nocturnal dyspnea  No nausea No numbness or tingling of extremity  No polydipsia No polyuria  No speech difficulty No syncope   Last metabolic panel Lab Results  Component Value Date   GLUCOSE 209 (H) 06/09/2021   NA 140 06/09/2021   K 4.6 06/09/2021   BUN 20 06/09/2021   CREATININE 1.29 (H) 06/09/2021   EGFR 58 (L) 06/09/2021   GFRNONAA 65 01/17/2020   CALCIUM 9.8 06/09/2021   AST 17 06/09/2021   ALT 9 06/09/2021   The 10-year ASCVD risk score (Arnett DK, et al., 2019) is:  50.7%  ---------------------------------------------------------------------------------------------------   Medications: Outpatient Medications Prior to Visit  Medication Sig   amLODipine-benazepril (LOTREL) 5-40 MG capsule TAKE 1 CAPSULE BY MOUTH ONCE DAILY   aspirin 81 MG chewable tablet Chew by mouth daily.   B-D UF III MINI PEN NEEDLES 31G X 5 MM MISC USE WITH PEN DAILY   Blood Glucose Monitoring Suppl (ONE TOUCH ULTRA SYSTEM KIT) w/Device KIT To use daily to check sugar. DX E11.9-needs one touch ultra meter   cholecalciferol (VITAMIN D) 25 MCG (1000 UT) tablet Take 1,000 Units by mouth daily.    Coenzyme Q10 100 MG TABS Take 100 mg by mouth daily.    fluticasone (FLONASE) 50 MCG/ACT nasal spray USE 2 SPRAYS INTO EACH NOSTRIL EVERY DAY   ibuprofen (ADVIL) 800 MG tablet Take 1 tablet (800 mg total) by mouth every 8 (eight) hours as needed for mild pain or moderate pain.   JARDIANCE 25 MG TABS tablet TAKE 1 TABLET BY MOUTH ONCE DAILY   Lancets (ONETOUCH ULTRASOFT) lancets USE 1  TO CHECK GLUCOSE THREE TIMES DAILY   metFORMIN (GLUCOPHAGE) 500 MG tablet TAKE 1 TABLET BY MOUTH TWICE A DAY WITH A MEAL   metoprolol succinate (TOPROL-XL) 100 MG 24 hr tablet TAKE 1 TABLET BY MOUTH ONCE A DAY WITH OR IMMEDIATELY FOLLOWING A MEAL   montelukast (SINGULAIR) 10 MG tablet TAKE 1 TABLET BY MOUTH ONCE A DAY   omeprazole (PRILOSEC) 40 MG capsule TAKE 1 CAPSULE BY MOUTH ONCE DAILY   ONETOUCH ULTRA test strip USE 1 STRIP TO CHECK GLUCOSE TWICE DAILY AS NEEDED   rosuvastatin (CRESTOR) 20 MG tablet TAKE 1 TABLET BY MOUTH ONCE A DAY   sildenafil (VIAGRA) 100 MG tablet Take 50 mg by mouth daily as needed for erectile dysfunction.   tadalafil (CIALIS) 5 MG tablet TAKE ONE TABLET BY MOUTH DAILY AS NEEDED FOR ERECTILE DYSFUNCTION   VICTOZA 18 MG/3ML SOPN INJECT 0.3 MLS (1.8 MG) UNDER THE SKIN ONCE DAILY   vitamin B-12 (CYANOCOBALAMIN) 1000 MCG tablet Take 1,000 mcg by mouth daily.   No facility-administered  medications prior to visit.    Review of Systems  Last hemoglobin A1c Lab Results  Component Value Date   HGBA1C 7.2 (A) 11/29/2021       Objective    BP 130/60 (BP Location: Right Arm, Patient Position: Sitting, Cuff Size: Large)   Pulse 75   Temp 98.4 F (36.9 C) (Oral)   Resp 16   Ht $R'5\' 10"'jR$  (1.778 m)   Wt 261 lb 3.2 oz (118.5 kg)   SpO2 96%   BMI 37.48 kg/m  BP Readings from Last 3 Encounters:  04/12/22 130/60  02/07/22 (!) 146/60  11/29/21 (!) 169/77   Wt Readings from Last 3 Encounters:  04/12/22 261 lb 3.2 oz (118.5 kg)  02/07/22 258 lb (117 kg)  11/29/21 258 lb (117 kg)      Physical Exam  Vitals reviewed.  Constitutional:      General: He is not in acute distress.    Appearance: He is well-developed. He is obese.  HENT:     Head: Normocephalic and atraumatic.     Right Ear: Hearing normal.     Left Ear: Hearing normal.     Nose: Nose normal.  Eyes:     General: Lids are normal. No scleral icterus.       Right eye: No discharge.        Left eye: No discharge.     Conjunctiva/sclera: Conjunctivae normal.  Cardiovascular:     Rate and Rhythm: Normal rate and regular rhythm.     Pulses: Normal pulses.     Heart sounds: Normal heart sounds.  Pulmonary:     Effort: Pulmonary effort is normal. No respiratory distress.     Breath sounds: Normal breath sounds.  Abdominal:     Palpations: Abdomen is soft.  Skin:    General: Skin is warm and dry.     Findings: No lesion or rash.  Neurological:     General: No focal deficit present.     Mental Status: He is alert and oriented to person, place, and time.  Psychiatric:        Mood and Affect: Mood normal.        Speech: Speech normal.        Behavior: Behavior normal.        Thought Content: Thought content normal.        Judgment: Judgment normal.       No results found for any visits on 04/12/22.  Assessment & Plan     1. Type 2 diabetes mellitus with other circulatory complication, without  long-term current use of insulin (HCC) Goal A1c less than 7.5.  Patient is on Jardiance and Victoza and metformin. - Hemoglobin A1c - Comprehensive metabolic panel - Lipid panel - Comprehensive metabolic panel - CBC with Differential/Platelet - Microalbumin / creatinine urine ratio  2. Obstructive apnea Get Back on CPAP.  3. Dyspnea on exertion This is a gradual and progressive but definitely worse per patient.  Move up his cardiology appointment. There is nothing to suggest this is unstable so no cardiogram obtained today. Likely this is just very much deconditioning and he needs to get back on his CPAP.   4. Pure hypercholesterolemia On rosuvastatin 20 - Lipid panel - Comprehensive metabolic panel - CBC with Differential/Platelet  5. Benign essential HTN Good control - Comprehensive metabolic panel - CBC with Differential/Platelet  6. Class 2 severe obesity due to excess calories with serious comorbidity and body mass index (BMI) of 37.0 to 37.9 in adult [facility]) Weight loss would be very helpful  7. Atherosclerosis of native coronary artery of native heart without angina pectoris All risk factors treated.    No follow-ups on file.      I, [name], MD, have reviewed all documentation for this visit. The documentation on 04/13/22 for the exam, diagnosis, procedures, and orders are all accurate and complete.    Josceline Chenard [name], MD  [facility] [phone] (phone) [phone] (fax)  [place]

## 2022-04-13 DIAGNOSIS — I10 Essential (primary) hypertension: Secondary | ICD-10-CM | POA: Diagnosis not present

## 2022-04-13 DIAGNOSIS — E1159 Type 2 diabetes mellitus with other circulatory complications: Secondary | ICD-10-CM | POA: Diagnosis not present

## 2022-04-13 DIAGNOSIS — E7800 Pure hypercholesterolemia, unspecified: Secondary | ICD-10-CM | POA: Diagnosis not present

## 2022-04-14 LAB — LIPID PANEL
Chol/HDL Ratio: 4.3 ratio (ref 0.0–5.0)
Cholesterol, Total: 137 mg/dL (ref 100–199)
HDL: 32 mg/dL — ABNORMAL LOW (ref >39)
LDL Chol Calc (NIH): 69 mg/dL (ref 0–99)
Triglycerides: 219 mg/dL — ABNORMAL HIGH (ref 0–149)
VLDL Cholesterol Cal: 36 mg/dL (ref 5–40)

## 2022-04-14 LAB — CBC WITH DIFFERENTIAL/PLATELET
Basophils Absolute: 0.1 10*3/uL (ref 0.0–0.2)
Basos: 1 %
EOS (ABSOLUTE): 0.3 10*3/uL (ref 0.0–0.4)
Eos: 5 %
Hematocrit: 44.8 % (ref 37.5–51.0)
Hemoglobin: 14.8 g/dL (ref 13.0–17.7)
Immature Grans (Abs): 0 10*3/uL (ref 0.0–0.1)
Immature Granulocytes: 1 %
Lymphocytes Absolute: 1.2 10*3/uL (ref 0.7–3.1)
Lymphs: 22 %
MCH: 31 pg (ref 26.6–33.0)
MCHC: 33 g/dL (ref 31.5–35.7)
MCV: 94 fL (ref 79–97)
Monocytes Absolute: 0.5 10*3/uL (ref 0.1–0.9)
Monocytes: 10 %
Neutrophils Absolute: 3.4 10*3/uL (ref 1.4–7.0)
Neutrophils: 61 %
Platelets: 172 10*3/uL (ref 150–450)
RBC: 4.77 x10E6/uL (ref 4.14–5.80)
RDW: 14.5 % (ref 11.6–15.4)
WBC: 5.5 10*3/uL (ref 3.4–10.8)

## 2022-04-14 LAB — COMPREHENSIVE METABOLIC PANEL
ALT: 10 IU/L (ref 0–44)
AST: 14 IU/L (ref 0–40)
Albumin/Globulin Ratio: 2.5 — ABNORMAL HIGH (ref 1.2–2.2)
Albumin: 4.7 g/dL (ref 3.8–4.8)
Alkaline Phosphatase: 84 IU/L (ref 44–121)
BUN/Creatinine Ratio: 13 (ref 10–24)
BUN: 17 mg/dL (ref 8–27)
Bilirubin Total: 1 mg/dL (ref 0.0–1.2)
CO2: 24 mmol/L (ref 20–29)
Calcium: 9.8 mg/dL (ref 8.6–10.2)
Chloride: 104 mmol/L (ref 96–106)
Creatinine, Ser: 1.32 mg/dL — ABNORMAL HIGH (ref 0.76–1.27)
Globulin, Total: 1.9 g/dL (ref 1.5–4.5)
Glucose: 214 mg/dL — ABNORMAL HIGH (ref 70–99)
Potassium: 5 mmol/L (ref 3.5–5.2)
Sodium: 142 mmol/L (ref 134–144)
Total Protein: 6.6 g/dL (ref 6.0–8.5)
eGFR: 56 mL/min/{1.73_m2} — ABNORMAL LOW (ref >59)

## 2022-04-14 LAB — HEMOGLOBIN A1C
Est. average glucose Bld gHb Est-mCnc: 174 mg/dL
Hgb A1c MFr Bld: 7.7 % — ABNORMAL HIGH (ref 4.8–5.6)

## 2022-04-14 LAB — MICROALBUMIN / CREATININE URINE RATIO
Creatinine, Urine: 23.3 mg/dL
Microalb/Creat Ratio: 35 mg/g creat — ABNORMAL HIGH (ref 0–29)
Microalbumin, Urine: 8.2 ug/mL

## 2022-04-14 LAB — SPECIMEN STATUS REPORT

## 2022-04-16 DIAGNOSIS — H00015 Hordeolum externum left lower eyelid: Secondary | ICD-10-CM | POA: Diagnosis not present

## 2022-05-04 ENCOUNTER — Ambulatory Visit: Payer: Self-pay | Admitting: *Deleted

## 2022-05-04 NOTE — Patient Outreach (Signed)
  Care Coordination   Initial Visit Note   05/04/2022 Name: LEROY PETTWAY MRN: [id number] DOB: 05-05-1946  [name] is a 76 y.o. year old male who sees [name]., MD for primary care. I spoke with  [name] by phone today  What matters to the patients health and wellness today?  Patient verbalized no concerns at this time    Goals Addressed             This Visit's Progress    care coordination activities       Care Coordination Interventions:  SDOH screening completed Care Management program introduced Performed Chart Review-Annual Wellness Visit 10/12/21        SDOH assessments and interventions completed:  Yes  SDOH Interventions Today    Flowsheet Row Most Recent Value  SDOH Interventions   Housing Interventions Intervention Not Indicated  Transportation Interventions Intervention Not Indicated        Care Coordination Interventions Activated:  Yes  Care Coordination Interventions:  Yes, provided   Follow up plan: No further intervention required.   Encounter Outcome:  Pt. Visit Completed

## 2022-05-04 NOTE — Patient Instructions (Signed)
Visit Information  Thank you for taking time to visit with me today. Please don't hesitate to contact me if I can be of assistance to you.   Following are the goals we discussed today:   Goals Addressed             This Visit's Progress    care coordination activities       Care Coordination Interventions:  SDOH screening completed Care Management program introduced Performed Chart Review-Annual Wellness Visit 10/12/21         Please call the care guide team at [phone] if you need to cancel or reschedule your appointment.   If you are experiencing a Mental Health or [place] or need someone to talk to, please call the Suicide and Crisis Lifeline: 988   Patient verbalizes understanding of instructions and care plan provided today and agrees to view in [place]. Active MyChart status and patient understanding of how to access instructions and care plan via MyChart confirmed with patient.     No further follow up required  Elliot Gurney, [place] Worker  [facility] Care Management [phone]

## 2022-05-07 ENCOUNTER — Other Ambulatory Visit: Payer: Self-pay | Admitting: Family Medicine

## 2022-05-07 DIAGNOSIS — E118 Type 2 diabetes mellitus with unspecified complications: Secondary | ICD-10-CM

## 2022-05-07 DIAGNOSIS — I10 Essential (primary) hypertension: Secondary | ICD-10-CM

## 2022-05-08 ENCOUNTER — Encounter: Payer: Self-pay | Admitting: Family Medicine

## 2022-05-09 ENCOUNTER — Other Ambulatory Visit: Payer: Self-pay

## 2022-05-09 DIAGNOSIS — R0609 Other forms of dyspnea: Secondary | ICD-10-CM

## 2022-06-02 ENCOUNTER — Other Ambulatory Visit: Payer: Self-pay | Admitting: Family Medicine

## 2022-06-02 DIAGNOSIS — E1159 Type 2 diabetes mellitus with other circulatory complications: Secondary | ICD-10-CM

## 2022-06-07 ENCOUNTER — Other Ambulatory Visit: Payer: Self-pay | Admitting: Family Medicine

## 2022-06-07 DIAGNOSIS — E1159 Type 2 diabetes mellitus with other circulatory complications: Secondary | ICD-10-CM

## 2022-06-09 ENCOUNTER — Ambulatory Visit (INDEPENDENT_AMBULATORY_CARE_PROVIDER_SITE_OTHER): Payer: PPO | Admitting: Family Medicine

## 2022-06-09 ENCOUNTER — Encounter: Payer: Self-pay | Admitting: Family Medicine

## 2022-06-09 VITALS — BP 126/55 | HR 69 | Temp 98.4°F | Ht 70.0 in | Wt 259.0 lb

## 2022-06-09 DIAGNOSIS — S88911S Complete traumatic amputation of right lower leg, level unspecified, sequela: Secondary | ICD-10-CM | POA: Diagnosis not present

## 2022-06-09 DIAGNOSIS — Z0000 Encounter for general adult medical examination without abnormal findings: Secondary | ICD-10-CM

## 2022-06-09 DIAGNOSIS — K22710 Barrett's esophagus with low grade dysplasia: Secondary | ICD-10-CM | POA: Diagnosis not present

## 2022-06-09 DIAGNOSIS — Z6837 Body mass index (BMI) 37.0-37.9, adult: Secondary | ICD-10-CM

## 2022-06-09 DIAGNOSIS — G4733 Obstructive sleep apnea (adult) (pediatric): Secondary | ICD-10-CM

## 2022-06-09 DIAGNOSIS — E1159 Type 2 diabetes mellitus with other circulatory complications: Secondary | ICD-10-CM | POA: Diagnosis not present

## 2022-06-09 DIAGNOSIS — Z86010 Personal history of colonic polyps: Secondary | ICD-10-CM | POA: Diagnosis not present

## 2022-06-09 DIAGNOSIS — I2510 Atherosclerotic heart disease of native coronary artery without angina pectoris: Secondary | ICD-10-CM | POA: Diagnosis not present

## 2022-06-09 DIAGNOSIS — Z23 Encounter for immunization: Secondary | ICD-10-CM

## 2022-06-09 NOTE — Progress Notes (Unsigned)
I,Roshena L Chambers,acting as a scribe for [name], MD.,have documented all relevant documentation on the behalf of [name], MD,as directed by  [name], MD while in the presence of [name], MD.     Complete physical exam   Patient: Andrew Lyons   DOB: [birth year]   76 y.o. Male  MRN: [id number] Visit Date: 06/09/2022  Today's healthcare provider: [name], MD   Chief Complaint  Patient presents with   Annual Exam   Subjective    Andrew Lyons is a 76 y.o. male who presents today for a complete physical exam.   HPI  Patient had AWV with [facility] on 10/18/2021.  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    Barrett's esophagus with dysplasia    Cataract    COPD (chronic obstructive pulmonary disease) (HCC)    Coronary artery disease    Diabetes mellitus without complication (HCC)    GERD (gastroesophageal reflux disease)    Hepatitis    Hx of dysplastic nevus 10/19/2006   L upper back, medial to mid scapula, slight to moderate atypia   Hx of dysplastic nevus 03/18/2008   R upper back 4.0cm lat to spine, moderate to marked atypia, excised 04/15/2008   Hx of dysplastic nevus 09/22/2008   R mid back 5.0cm lat to spine, moderate atypia   Hx of dysplastic nevus 04/06/2009   L mid to low back 11.0cm lat to spine, mild atypia   Hyperlipidemia    Hypertension    Myocardial infarction ([place])    Seizures ([place])    Sleep apnea    Past Surgical History:  Procedure Laterality Date   ABDOMINAL AORTOGRAM W/LOWER EXTREMITY N/A 06/20/2018   Procedure: ABDOMINAL AORTOGRAM W/LOWER EXTREMITY;  Surgeon: Wellington Hampshire, MD;  Location: [facility];  Service: Cardiovascular;  Laterality: N/A;   AMPUTATION Right 10/27/2018   Procedure: AMPUTATION BELOW KNEE;  Surgeon: Leim Fabry, MD;  Location: [facility];  Service: Orthopedics;  Laterality: Right;   CARDIAC CATHETERIZATION     with  Angioplasty   cardiac stents     CARDIAC VALVE REPLACEMENT     Aortic Valve Replacement   COLONOSCOPY N/A 03/26/2020   Procedure: COLONOSCOPY;  Surgeon: Benjamine Sprague, DO;  Location: [facility];  Service: General;  Laterality: N/A;   COLONOSCOPY WITH PROPOFOL N/A 03/18/2015   Procedure: COLONOSCOPY WITH PROPOFOL;  Surgeon: [name], MD;  Location: [facility];  Service: Endoscopy;  Laterality: N/A;   ESOPHAGOGASTRODUODENOSCOPY N/A 03/18/2015   Procedure: ESOPHAGOGASTRODUODENOSCOPY (EGD);  Surgeon: [name], MD;  Location: [facility];  Service: Endoscopy;  Laterality: N/A;   FOOT AMPUTATION     GREEN LIGHT LASER TURP (TRANSURETHRAL RESECTION OF PROSTATE N/A 10/16/2018   Procedure: GREEN LIGHT LASER TURP (TRANSURETHRAL RESECTION OF PROSTATE;  Surgeon: Royston Cowper, MD;  Location: [facility];  Service: Urology;  Laterality: N/A;   HARDWARE REMOVAL Right 10/27/2018   Procedure: HARDWARE REMOVAL;  Surgeon: Leim Fabry, MD;  Location: [facility];  Service: Orthopedics;  Laterality: Right;   HEMORRHOID SURGERY N/A 03/26/2020   Procedure: HEMORRHOIDECTOMY;  Surgeon: Benjamine Sprague, DO;  Location: [facility];  Service: General;  Laterality: N/A;   LOWER EXTREMITY ANGIOGRAPHY Right 10/25/2018   Procedure: Lower Extremity Angiography;  Surgeon: Algernon Huxley, MD;  Location: [facility] [facility];  Service: Cardiovascular;  Laterality: Right;   PERIPHERAL VASCULAR INTERVENTION Right 06/20/2018  Procedure: PERIPHERAL VASCULAR INTERVENTION;  Surgeon: Wellington Hampshire, MD;  Location: [place] [facility];  Service: Cardiovascular;  Laterality: Right;   THORACOTOMY Right    TONSILLECTOMY     Social History   Socioeconomic History   Marital status: Married    Spouse name: Not on file   Number of children: 2   Years of education: Not on file   Highest education level: 8th grade  Occupational History   Occupation: retired  Tobacco Use   Smoking status: Former    Types: Cigarettes    Quit date:  10/02/2001    Years since quitting: 20.6   Smokeless tobacco: Former   Tobacco comments:    Quit smoking in 2003; Started smoking at age 42, smoked about 40 years, smoked over 3 packs per day  Vaping Use   Vaping Use: Never used  Substance and Sexual Activity   Alcohol use: Not Currently   Drug use: No   Sexual activity: Not on file  Other Topics Concern   Not on file  Social History Narrative   Not on file   Social Determinants of Health   Financial Resource Strain: [place]  (05/04/2022)   Overall Financial Resource Strain (CARDIA)    Difficulty of Paying Living Expenses: Not hard at all  Food Insecurity: No Food Insecurity (05/04/2022)   Hunger Vital Sign    Worried About Running Out of Food in the Last Year: Never true    [place] in the Last Year: Never true  Transportation Needs: No Transportation Needs (05/04/2022)   PRAPARE - Hydrologist (Medical): No    Lack of Transportation (Non-Medical): No  Physical Activity: Inactive (10/18/2021)   Exercise Vital Sign    Days of Exercise per Week: 0 days    Minutes of Exercise per Session: 0 min  Stress: No Stress Concern Present (10/18/2021)   [place]    Feeling of Stress : Not at all  Social Connections: Moderately Isolated (10/18/2021)   Social Connection and Isolation Panel [NHANES]    Frequency of Communication with Friends and Family: More than three times a week    Frequency of Social Gatherings with Friends and Family: More than three times a week    Attends Religious Services: Never    Marine scientist or Organizations: No    Attends Archivist Meetings: Never    Marital Status: Married  Human resources officer Violence: Not At Risk (10/18/2021)   Humiliation, Afraid, Rape, and Kick questionnaire    Fear of Current or Ex-Partner: No    Emotionally Abused: No    Physically Abused: No    Sexually Abused: No    Family Status  Relation Name Status   Son  Alive   Brother #2 Alive   Mother  Deceased at age 21       died from an MI   Father  Deceased at age 74       died from an MI;   Sister  68   Brother #1 Deceased at age 85       cause of death was complications after a bypass surgery   Daughter  Alive   Family History  Problem Relation Age of Onset   Obesity Son    Diabetes Brother    Hypertension Brother    Heart attack Mother    Heart attack Father    Allergies  Allergen Reactions   Iodinated Contrast Media Rash    had a red chest -unusre of what kind of dye it was   Zyban [Bupropion] Rash   Zocor [Simvastatin] Other (See Comments)    Reaction: hepatitis    Gadolinium Derivatives Rash    Patient Care Team: [name]., MD as PCP - General (Family Medicine) Corey Skains, MD as Consulting Physician (Cardiology) Lucky Cowboy Erskine Squibb, MD as Referring Physician (Vascular Surgery) Ralene Bathe, MD (Dermatology) Earnestine Leys, MD (Orthopedic Surgery) Benjamine Sprague, DO as Consulting Physician (Surgery) Marchia Meiers, MD (Inactive) as Consulting Physician (Ophthalmology)   Medications: Outpatient Medications Prior to Visit  Medication Sig   amLODipine-benazepril (LOTREL) 5-40 MG capsule TAKE 1 CAPSULE BY MOUTH ONCE DAILY   aspirin 81 MG chewable tablet Chew by mouth daily.   B-D UF III MINI PEN NEEDLES 31G X 5 MM MISC USE WITH PEN DAILY   Blood Glucose Monitoring Suppl (ONE TOUCH ULTRA SYSTEM KIT) w/Device KIT To use daily to check sugar. DX E11.9-needs one touch ultra meter   cholecalciferol (VITAMIN D) 25 MCG (1000 UT) tablet Take 1,000 Units by mouth daily.    Coenzyme Q10 100 MG TABS Take 100 mg by mouth daily.    fluticasone (FLONASE) 50 MCG/ACT nasal spray USE 2 SPRAYS INTO EACH NOSTRIL EVERY DAY   ibuprofen (ADVIL) 800 MG tablet Take 1 tablet (800 mg total) by mouth every 8 (eight) hours as needed for mild pain or moderate pain.   JARDIANCE 25 MG TABS  tablet TAKE 1 TABLET BY MOUTH ONCE DAILY   Lancets (ONETOUCH ULTRASOFT) lancets USE 1  TO CHECK GLUCOSE THREE TIMES DAILY   metFORMIN (GLUCOPHAGE) 500 MG tablet TAKE 1 TABLET BY MOUTH TWICE A DAY WITH A MEAL   metoprolol succinate (TOPROL-XL) 100 MG 24 hr tablet TAKE 1 TABLET BY MOUTH ONCE A DAY WITH OR IMMEDIATELY FOLLOWING A MEAL   montelukast (SINGULAIR) 10 MG tablet TAKE 1 TABLET BY MOUTH ONCE A DAY   omeprazole (PRILOSEC) 40 MG capsule TAKE 1 CAPSULE BY MOUTH ONCE DAILY   ONETOUCH ULTRA test strip USE 1 STRIP TO CHECK GLUCOSE TWICE DAILY AS NEEDED   rosuvastatin (CRESTOR) 20 MG tablet TAKE 1 TABLET BY MOUTH ONCE A DAY   sildenafil (VIAGRA) 100 MG tablet Take 50 mg by mouth daily as needed for erectile dysfunction.   tadalafil (CIALIS) 5 MG tablet TAKE ONE TABLET BY MOUTH ONCE DAILY AS NEEDED FOR ERECTILE DYSFUNCTION   VICTOZA 18 MG/3ML SOPN INJECT 0.3 MLS (1.8 MG) UNDER THE SKIN ONCE DAILY   vitamin B-12 (CYANOCOBALAMIN) 1000 MCG tablet Take 1,000 mcg by mouth daily.   No facility-administered medications prior to visit.    Review of Systems  Constitutional:  Negative for appetite change, chills, fatigue and fever.  HENT:  Positive for rhinorrhea. Negative for congestion, ear pain, hearing loss, nosebleeds and trouble swallowing.   Eyes:  Positive for itching. Negative for pain and visual disturbance.  Respiratory:  Negative for cough, chest tightness and shortness of breath.   Cardiovascular:  Negative for chest pain, palpitations and leg swelling.  Gastrointestinal:  Negative for abdominal pain, blood in stool, constipation, diarrhea, nausea and vomiting.  Endocrine: Negative for polydipsia, polyphagia and polyuria.  Genitourinary:  Negative for dysuria and flank pain.  Musculoskeletal:  Negative for arthralgias, back pain, joint swelling, myalgias and neck stiffness.  Skin:  Negative for color change, rash and wound.  Neurological:  Negative for dizziness, tremors, seizures, speech  difficulty, weakness, light-headedness and headaches.  Psychiatric/Behavioral:  Negative for behavioral problems, confusion, decreased concentration, dysphoric mood and sleep disturbance. The patient is not nervous/anxious.   All other systems reviewed and are negative.   {Labs  Heme  Chem  Endocrine  Serology  Results Review (optional):23779}  Objective     BP (!) 126/55 (BP Location: Left Arm, Patient Position: Sitting, Cuff Size: Large)   Pulse 69   Temp 98.4 F (36.9 C) (Oral)   Ht $R'5\' 10"'qH$  (1.778 m)   Wt 259 lb (117.5 kg)   SpO2 98%   BMI 37.16 kg/m  {Show previous vital signs (optional):23777}   Physical Exam  ***  Last depression screening scores    06/09/2022   10:28 AM 05/04/2022    1:22 PM 10/18/2021    3:49 PM  PHQ 2/9 Scores  PHQ - 2 Score 0 0 0  PHQ- 9 Score 0     Last fall risk screening    06/09/2022   10:28 AM  Fall Risk   Falls in the past year? 0  Number falls in past yr: 0  Injury with Fall? 0  Risk for fall due to : No Fall Risks  Follow up Falls evaluation completed   Last Audit-C alcohol use screening    10/18/2021    3:48 PM  Alcohol Use Disorder Test (AUDIT)  1. How often do you have a drink containing alcohol? 1  2. How many drinks containing alcohol do you have on a typical day when you are drinking? 0  3. How often do you have six or more drinks on one occasion? 0  AUDIT-C Score 1   A score of 3 or more in women, and 4 or more in men indicates increased risk for alcohol abuse, EXCEPT if all of the points are from question 1   No results found for any visits on 06/09/22.  Assessment & Plan    Routine Health Maintenance and Physical Exam  Exercise Activities and Dietary recommendations  Goals       care coordination activities      Care Coordination Interventions:  SDOH screening completed Care Management program introduced Performed Chart Review-Annual Wellness Visit 10/12/21      DIET - EAT MORE FRUITS AND VEGETABLES       DIET - REDUCE CALORIE INTAKE      Recommend to continue cutting back on starches to help aid in weight loss and better diabetes control.       DIET - REDUCE SUGAR INTAKE      Recommend to cut back on sweet and sugars and focus on healthier alternatives such as fruits and vegetables when snacking.       I would like to come off of my Prilosec (pt-stated)      Current Barriers:  Pill burden  Pharmacist Clinical Goal(s):  Over the next 30 days, patient and pharmacist will work together to develop a taper to come off of omeprazole safely and with minimal side effects as reported by patient.   Interventions: Reviewed use, side effects, and adverse events of omeprazole Developed taper for DC omeprazole; approved taper with Dr. Rosanna Randy  Patient Self Care Activities:  Self administers medications as prescribed  Initial goal documentation       Medication assistance (pt-stated)      Current Barriers:  financial  Pharmacist Clinical Goal(s): Over the next 14 days, [name] will provide the necessary supplementary documents (proof of out of pocket prescription expenditure, proof  of household income) needed for medication assistance applications to [facility] pharmacist.   Interventions: [facility] pharmacist will apply for medication assistance program for Victoza made by [employer], and Jardiance made by [employer].    Patient Self Care Activities:  Gather necessary documents needed to apply for medication assistance  Initial goal documentation        Reduce portion size      Recommend eating 3 small meals a day and two healthy protein snacks in between.         Immunization History  Administered Date(s) Administered   Fluad Quad(high Dose 65+) 07/17/2019, 06/08/2021, 06/09/2022   Influenza, High Dose Seasonal PF 08/05/2016, 05/22/2017, 07/13/2018, 06/26/2020   Influenza,inj,Quad PF,6+ Mos 09/11/2015   PFIZER Comirnaty(Gray Top)Covid-19 Tri-Sucrose Vaccine 01/27/2021    PFIZER(Purple Top)SARS-COV-2 Vaccination 11/06/2019, 11/27/2019, 08/25/2020   Pneumococcal Conjugate-13 08/14/2014   Pneumococcal Polysaccharide-23 08/02/2007, 10/22/2012   Tdap 06/09/2010   Zoster, Live 10/22/2012    Health Maintenance  Topic Date Due   Zoster Vaccines- Shingrix (1 of 2) Never done   FOOT EXAM  01/23/2018   TETANUS/TDAP  06/09/2020   COVID-19 Vaccine (5 - Pfizer risk series) 03/24/2021   HEMOGLOBIN A1C  10/14/2022   OPHTHALMOLOGY EXAM  03/25/2023   Diabetic kidney evaluation - Urine ACR  04/13/2023   Diabetic kidney evaluation - GFR measurement  04/14/2023   COLONOSCOPY (Pts 45-110yrs Insurance coverage will need to be confirmed)  03/26/2025   Pneumonia Vaccine 80+ Years old  Completed   INFLUENZA VACCINE  Completed   Hepatitis C Screening  Completed   HPV VACCINES  Aged Out    Discussed health benefits of physical activity, and encouraged him to engage in regular exercise appropriate for his age and condition.  1. Annual physical exam   2. Need for immunization against influenza  - Flu Vaccine QUAD High Dose(Fluad)  3. Traumatic amputation of right lower extremity, sequela [facility]) Andrew Lyons is a 76 year old male, height $RemoveBefo'5\' 10"'QiweRMKqDyy$ , weight 259 lb. Andrew Lyons needs new prosthetics, because he is having to wear 12 ply socks and current prothesis is filthy. His current prothesis is over 17 years old and in need of full replacement. 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   Return in about 2 months (around 08/09/2022).     {provider attestation***:1}   [name], MD  [facility] [phone] (phone) [phone] (fax)  [place]

## 2022-06-13 ENCOUNTER — Telehealth: Payer: Self-pay

## 2022-06-13 NOTE — Telephone Encounter (Signed)
Copied from [place] [phone]. Topic: General - Other >> Jun 13, 2022 11:35 AM [place] B wrote: Reason for [id number] wife called in states patient had a sleep study done at his home on 06/06 and they havent heard anything back as to if he is getting a cpap machine or mask in the mail. Please call back

## 2022-06-15 NOTE — Telephone Encounter (Signed)
Placed an order on parachute today. Waiting on response for acceptance by [employer].

## 2022-06-15 NOTE — Telephone Encounter (Signed)
Started order in [place] for CPAP and supplies.

## 2022-06-16 NOTE — Telephone Encounter (Signed)
Order placed. Waiting for confirmation from [place].

## 2022-06-17 NOTE — Telephone Encounter (Signed)
Patient's wife called in checking on status of Cap machine, I gave her the info that we are waiting on confimation from parachute.

## 2022-06-17 NOTE — Telephone Encounter (Signed)
Messaged parachute for an update on delivery.

## 2022-06-27 ENCOUNTER — Other Ambulatory Visit: Payer: Self-pay | Admitting: Family Medicine

## 2022-06-28 NOTE — Telephone Encounter (Signed)
Patient's wife has been updated with information as below.

## 2022-06-28 NOTE — Telephone Encounter (Signed)
  Craig 10/3 ? 8:58AM '@Rhianon Zabawa'$  Tramond Slinker Good morning. We are waiting on insurance verification from our offshore team. Once this is done, we will be able to move forward with the order.

## 2022-07-06 DIAGNOSIS — I6523 Occlusion and stenosis of bilateral carotid arteries: Secondary | ICD-10-CM | POA: Diagnosis not present

## 2022-07-06 DIAGNOSIS — E782 Mixed hyperlipidemia: Secondary | ICD-10-CM | POA: Diagnosis not present

## 2022-07-06 DIAGNOSIS — Z952 Presence of prosthetic heart valve: Secondary | ICD-10-CM | POA: Diagnosis not present

## 2022-07-06 DIAGNOSIS — I10 Essential (primary) hypertension: Secondary | ICD-10-CM | POA: Diagnosis not present

## 2022-07-06 DIAGNOSIS — I25118 Atherosclerotic heart disease of native coronary artery with other forms of angina pectoris: Secondary | ICD-10-CM | POA: Diagnosis not present

## 2022-07-06 DIAGNOSIS — G4733 Obstructive sleep apnea (adult) (pediatric): Secondary | ICD-10-CM | POA: Diagnosis not present

## 2022-07-11 DIAGNOSIS — H0014 Chalazion left upper eyelid: Secondary | ICD-10-CM | POA: Diagnosis not present

## 2022-07-18 DIAGNOSIS — G4733 Obstructive sleep apnea (adult) (pediatric): Secondary | ICD-10-CM | POA: Diagnosis not present

## 2022-07-20 DIAGNOSIS — Z89511 Acquired absence of right leg below knee: Secondary | ICD-10-CM | POA: Diagnosis not present

## 2022-07-25 ENCOUNTER — Encounter (INDEPENDENT_AMBULATORY_CARE_PROVIDER_SITE_OTHER): Payer: Self-pay

## 2022-08-01 DIAGNOSIS — Z89511 Acquired absence of right leg below knee: Secondary | ICD-10-CM | POA: Diagnosis not present

## 2022-08-01 DIAGNOSIS — B351 Tinea unguium: Secondary | ICD-10-CM | POA: Diagnosis not present

## 2022-08-01 DIAGNOSIS — E1140 Type 2 diabetes mellitus with diabetic neuropathy, unspecified: Secondary | ICD-10-CM | POA: Diagnosis not present

## 2022-08-01 DIAGNOSIS — L600 Ingrowing nail: Secondary | ICD-10-CM | POA: Diagnosis not present

## 2022-08-09 ENCOUNTER — Encounter (INDEPENDENT_AMBULATORY_CARE_PROVIDER_SITE_OTHER): Payer: Self-pay | Admitting: Vascular Surgery

## 2022-08-09 ENCOUNTER — Ambulatory Visit (INDEPENDENT_AMBULATORY_CARE_PROVIDER_SITE_OTHER): Payer: PPO

## 2022-08-09 ENCOUNTER — Ambulatory Visit: Payer: PPO | Admitting: Physician Assistant

## 2022-08-09 ENCOUNTER — Ambulatory Visit (INDEPENDENT_AMBULATORY_CARE_PROVIDER_SITE_OTHER): Payer: PPO | Admitting: Vascular Surgery

## 2022-08-09 VITALS — BP 158/70 | HR 68 | Resp 19 | Ht 70.0 in | Wt 254.6 lb

## 2022-08-09 DIAGNOSIS — E7800 Pure hypercholesterolemia, unspecified: Secondary | ICD-10-CM | POA: Diagnosis not present

## 2022-08-09 DIAGNOSIS — I739 Peripheral vascular disease, unspecified: Secondary | ICD-10-CM | POA: Diagnosis not present

## 2022-08-09 DIAGNOSIS — E1159 Type 2 diabetes mellitus with other circulatory complications: Secondary | ICD-10-CM | POA: Diagnosis not present

## 2022-08-09 DIAGNOSIS — I10 Essential (primary) hypertension: Secondary | ICD-10-CM | POA: Diagnosis not present

## 2022-08-09 DIAGNOSIS — Z89511 Acquired absence of right leg below knee: Secondary | ICD-10-CM

## 2022-08-09 NOTE — Assessment & Plan Note (Signed)
Left ABI is 1.13 with multiphasic waveforms and normal digital pressure.  Doing well. No changes. Recheck in one year.

## 2022-08-09 NOTE — Progress Notes (Signed)
MRN : [id number]  OTIS PORTAL is a 76 y.o. (1946-05-12) male who presents with chief complaint of No chief complaint on file. Marland Kitchen  History of Present Illness: Patient returns today in follow up of his PAD. 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 He has no left leg symptoms. He is well today. Left ABI is 1.13 with multiphasic waveforms and normal digital pressure.   Current Outpatient Medications  Medication Sig Dispense Refill   amLODipine-benazepril (LOTREL) 5-40 MG capsule TAKE 1 CAPSULE BY MOUTH ONCE DAILY 90 capsule 1   aspirin 81 MG chewable tablet Chew by mouth daily.     B-D UF III MINI PEN NEEDLES 31G X 5 MM MISC USE WITH PEN DAILY 100 each 3   Blood Glucose Monitoring Suppl (ONE TOUCH ULTRA SYSTEM KIT) w/Device KIT To use daily to check sugar. DX E11.9-needs one touch ultra meter 1 each 0   cholecalciferol (VITAMIN D) 25 MCG (1000 UT) tablet Take 1,000 Units by mouth daily.      clopidogrel (PLAVIX) 75 MG tablet TAKE 1 TABLET BY MOUTH ONCE A DAY 90 tablet 0   Coenzyme Q10 100 MG TABS Take 100 mg by mouth daily.      fluticasone (FLONASE) 50 MCG/ACT nasal spray USE 2 SPRAYS INTO EACH NOSTRIL EVERY DAY 48 g 5   ibuprofen (ADVIL) 800 MG tablet Take 1 tablet (800 mg total) by mouth every 8 (eight) hours as needed for mild pain or moderate pain. 30 tablet 0   JARDIANCE 25 MG TABS tablet TAKE 1 TABLET BY MOUTH ONCE DAILY 90 tablet 3   Lancets (ONETOUCH ULTRASOFT) lancets USE 1  TO CHECK GLUCOSE THREE TIMES DAILY 100 each 11   metFORMIN (GLUCOPHAGE) 500 MG tablet TAKE 1 TABLET BY MOUTH TWICE A DAY WITH A MEAL 180 tablet 1   metoprolol succinate (TOPROL-XL) 100 MG 24 hr tablet TAKE 1 TABLET BY MOUTH ONCE A DAY WITH OR IMMEDIATELY FOLLOWING A MEAL 90 tablet 3   montelukast (SINGULAIR) 10 MG tablet TAKE 1 TABLET BY MOUTH ONCE A DAY 90 tablet 3   omeprazole (PRILOSEC) 40 MG capsule TAKE 1 CAPSULE BY MOUTH ONCE DAILY 90 capsule 3   ONETOUCH ULTRA test strip USE 1 STRIP TO  CHECK GLUCOSE TWICE DAILY AS NEEDED 100 each 0   rosuvastatin (CRESTOR) 20 MG tablet TAKE 1 TABLET BY MOUTH ONCE A DAY 90 tablet 3   sildenafil (VIAGRA) 100 MG tablet Take 50 mg by mouth daily as needed for erectile dysfunction.     tadalafil (CIALIS) 5 MG tablet TAKE ONE TABLET BY MOUTH ONCE DAILY AS NEEDED FOR ERECTILE DYSFUNCTION 90 tablet 4   VICTOZA 18 MG/3ML SOPN INJECT 0.3 MLS (1.8 MG) UNDER THE SKIN ONCE DAILY 9 mL 2   vitamin B-12 (CYANOCOBALAMIN) 1000 MCG tablet Take 1,000 mcg by mouth daily.     No current facility-administered medications for this visit.    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    Barrett's esophagus with dysplasia    Cataract    COPD (chronic obstructive pulmonary disease) (HCC)    Coronary artery disease    Diabetes mellitus without complication (HCC)    GERD (gastroesophageal reflux disease)    Hepatitis    Hx of dysplastic nevus 10/19/2006   L upper back, medial to mid scapula, slight to moderate atypia   Hx of dysplastic nevus 03/18/2008   R upper back  4.0cm lat to spine, moderate to marked atypia, excised 04/15/2008   Hx of dysplastic nevus 09/22/2008   R mid back 5.0cm lat to spine, moderate atypia   Hx of dysplastic nevus 04/06/2009   L mid to low back 11.0cm lat to spine, mild atypia   Hyperlipidemia    Hypertension    Myocardial infarction ([place])    Seizures ([place])    Sleep apnea     Past Surgical History:  Procedure Laterality Date   ABDOMINAL AORTOGRAM W/LOWER EXTREMITY N/A 06/20/2018   Procedure: ABDOMINAL AORTOGRAM W/LOWER EXTREMITY;  Surgeon: Wellington Hampshire, MD;  Location: [facility];  Service: Cardiovascular;  Laterality: N/A;   AMPUTATION Right 10/27/2018   Procedure: AMPUTATION BELOW KNEE;  Surgeon: Leim Fabry, MD;  Location: [facility];  Service: Orthopedics;  Laterality: Right;   CARDIAC CATHETERIZATION     with Angioplasty   cardiac stents     CARDIAC VALVE REPLACEMENT      Aortic Valve Replacement   COLONOSCOPY N/A 03/26/2020   Procedure: COLONOSCOPY;  Surgeon: Benjamine Sprague, DO;  Location: [facility];  Service: General;  Laterality: N/A;   COLONOSCOPY WITH PROPOFOL N/A 03/18/2015   Procedure: COLONOSCOPY WITH PROPOFOL;  Surgeon: [name], MD;  Location: [facility];  Service: Endoscopy;  Laterality: N/A;   ESOPHAGOGASTRODUODENOSCOPY N/A 03/18/2015   Procedure: ESOPHAGOGASTRODUODENOSCOPY (EGD);  Surgeon: [name], MD;  Location: [facility] ENDOSCOPY;  Service: Endoscopy;  Laterality: N/A;   FOOT AMPUTATION     GREEN LIGHT LASER TURP (TRANSURETHRAL RESECTION OF PROSTATE N/A 10/16/2018   Procedure: GREEN LIGHT LASER TURP (TRANSURETHRAL RESECTION OF PROSTATE;  Surgeon: Royston Cowper, MD;  Location: [facility];  Service: Urology;  Laterality: N/A;   HARDWARE REMOVAL Right 10/27/2018   Procedure: HARDWARE REMOVAL;  Surgeon: Leim Fabry, MD;  Location: [facility];  Service: Orthopedics;  Laterality: Right;   HEMORRHOID SURGERY N/A 03/26/2020   Procedure: HEMORRHOIDECTOMY;  Surgeon: Benjamine Sprague, DO;  Location: [facility];  Service: General;  Laterality: N/A;   LOWER EXTREMITY ANGIOGRAPHY Right 10/25/2018   Procedure: Lower Extremity Angiography;  Surgeon: Algernon Huxley, MD;  Location: [place] [facility];  Service: Cardiovascular;  Laterality: Right;   PERIPHERAL VASCULAR INTERVENTION Right 06/20/2018   Procedure: PERIPHERAL VASCULAR INTERVENTION;  Surgeon: Wellington Hampshire, MD;  Location: [place] [facility];  Service: Cardiovascular;  Laterality: Right;   THORACOTOMY Right    TONSILLECTOMY       Social History   Tobacco Use   Smoking status: Former    Types: Cigarettes    Quit date: 10/02/2001    Years since quitting: 20.8   Smokeless tobacco: Former   Tobacco comments:    Quit smoking in 2003; Started smoking at age 25, smoked about 40 years, smoked over 3 packs per day  Vaping Use   Vaping Use: Never used  Substance Use Topics   Alcohol use: Not  Currently   Drug use: No      Family History  Problem Relation Age of Onset   Obesity Son    Diabetes Brother    Hypertension Brother    Heart attack Mother    Heart attack Father      Allergies  Allergen Reactions   Iodinated Contrast Media Rash    had a red chest -unusre of what kind of dye it was   Zyban [Bupropion] Rash   Zocor [Simvastatin] Other (See Comments)    Reaction: hepatitis    Gadolinium Derivatives Rash  REVIEW OF SYSTEMS (Negative unless checked)   Constitutional: []Weight loss  []Fever  []Chills Cardiac: []Chest pain   []Chest pressure   []Palpitations   []Shortness of breath when laying flat   []Shortness of breath at rest   []Shortness of breath with exertion. Vascular:  []Pain in legs with walking   []Pain in legs at rest   []Pain in legs when laying flat   []Claudication   []Pain in feet when walking  []Pain in feet at rest  []Pain in feet when laying flat   []History of DVT   []Phlebitis   []Swelling in legs   []Varicose veins   [x]Non-healing ulcers Pulmonary:   []Uses home oxygen   []Productive cough   []Hemoptysis   []Wheeze  [x]COPD   []Asthma Neurologic:  []Dizziness  []Blackouts   [x]Seizures   []History of stroke   []History of TIA  []Aphasia   []Temporary blindness   []Dysphagia   []Weakness or numbness in arms   []Weakness or numbness in legs Musculoskeletal:  [x]Arthritis   []Joint swelling   []Joint pain   []Low back pain Hematologic:  []Easy bruising  []Easy bleeding   []Hypercoagulable state   []Anemic   Gastrointestinal:  []Blood in stool   []Vomiting blood  [x]Gastroesophageal reflux/heartburn   [x]Abdominal pain Genitourinary:  []Chronic kidney disease   []Difficult urination  []Frequent urination  []Burning with urination   []Hematuria Skin:  []Rashes   [x]Ulcers   [x]Wounds Psychological:  []History of anxiety   [] History of major depression.  Physical Examination  BP (!) 158/70 (BP Location: Right Arm)   Pulse 68   Resp 19   Ht  5' 10" (1.778 m)   Wt 254 lb 9.6 oz (115.5 kg)   BMI 36.53 kg/m  Gen:  WD/WN, NAD Head: [place]/AT, No temporalis wasting. Ear/Nose/Throat: Hearing grossly intact, nares w/o erythema or drainage Eyes: Conjunctiva clear. Sclera non-icteric Neck: Supple.  Trachea midline Pulmonary:  Good air movement, no use of accessory muscles.  Cardiac: RRR, no JVD Vascular:  Vessel Right Left  Radial Palpable Palpable                          PT Not Palpable Palpable  DP Not Palpable Palpable   Gastrointestinal: soft, non-tender/non-distended. No guarding/reflex.  Musculoskeletal: M/S 5/5 throughout.  No deformity or atrophy. Well healed right leg amputation. No left leg edema. Neurologic: Sensation grossly intact in extremities.  Symmetrical.  Speech is fluent.  Psychiatric: Judgment intact, Mood & affect appropriate for pt's clinical situation. Dermatologic: No rashes or ulcers noted.  No cellulitis or open wounds.      Labs No results found for this or any previous visit (from the past 2160 hour(s)).  Radiology No results found.  Assessment/Plan Diabetes blood glucose control important in reducing the progression of atherosclerotic disease. Also, involved in wound healing. On appropriate medications.     HLD (hyperlipidemia) lipid control important in reducing the progression of atherosclerotic disease. Continue statin therapy   Hx of BKA, right ([place]) Now healed with a prosthesis  PAD (peripheral artery disease) (HCC) Left ABI is 1.13 with multiphasic waveforms and normal digital pressure.  Doing well. No changes. Recheck in one year.    Leotis Pain, MD  08/09/2022 11:56 AM    This note was created with Dragon medical transcription system.  Any errors from dictation are purely unintentional

## 2022-08-10 ENCOUNTER — Ambulatory Visit (INDEPENDENT_AMBULATORY_CARE_PROVIDER_SITE_OTHER): Payer: PPO | Admitting: Physician Assistant

## 2022-08-10 ENCOUNTER — Encounter: Payer: Self-pay | Admitting: Physician Assistant

## 2022-08-10 VITALS — BP 133/61 | HR 64 | Ht 70.0 in | Wt 247.6 lb

## 2022-08-10 DIAGNOSIS — E1159 Type 2 diabetes mellitus with other circulatory complications: Secondary | ICD-10-CM | POA: Diagnosis not present

## 2022-08-10 LAB — POCT GLYCOSYLATED HEMOGLOBIN (HGB A1C): Hemoglobin A1C: 8 % — AB (ref 4.0–5.6)

## 2022-08-10 MED ORDER — TIRZEPATIDE 2.5 MG/0.5ML ~~LOC~~ SOAJ: 2.5000 mg | SUBCUTANEOUS | 0 refills | Status: DC

## 2022-08-10 NOTE — Assessment & Plan Note (Signed)
Prior A1c 7.7% today 8.0% Pt managing with metformin 500 mg bid, jardiance 25 mg daily, and victoza 1.8 mg daily. Discussed switching victoza to weekly mounjaro. Hoping for additional glycemic and weight loss benefit. For patient ease, knowing there will be an increase in sugars for a period of time-- stop victoza they day he starts mounjaro.  Advised of titration, will start at 2.5 mg and titrate up to 7.5 mg  Pt plans on following [name] to next practice. Advised that if this doesn't happen, I would want to f/u 3 mo . Pt sees podiatry for foot exam, vascular, cardio.  Uacr utd.

## 2022-08-10 NOTE — Progress Notes (Signed)
I,Sha'taria Tyson,acting as a Education administrator for [employer], PA-C.,have documented all relevant documentation on the behalf of Mikey Kirschner, PA-C,as directed by  Mikey Kirschner, PA-C while in the presence of Mikey Kirschner, PA-C.   Established patient visit   Patient: Andrew Lyons   DOB: 08/02/1946   76 y.o. Male  MRN: [id number] Visit Date: 08/10/2022  Today's healthcare provider: Mikey Kirschner, PA-C   Cc. DM II f/u  Subjective    HPI  Diabetes Mellitus Type II, Follow-up  Lab Results  Component Value Date   HGBA1C 8.0 (A) 08/10/2022   HGBA1C 7.7 (H) 04/13/2022   HGBA1C 7.2 (A) 11/29/2021   Wt Readings from Last 3 Encounters:  08/10/22 247 lb 9 oz (112.3 kg)  08/09/22 254 lb 9.6 oz (115.5 kg)  06/09/22 259 lb (117.5 kg)   Last seen for diabetes 4 months ago.  Management since then includes continue current treatment. He reports excellent compliance with treatment. He is not having side effects. Symptoms: No fatigue No foot ulcerations  No appetite changes No nausea  No paresthesia of the feet  No polydipsia  No polyuria No visual disturbances   No vomiting     Home blood sugar records:  not being checked  Episodes of hypoglycemia? No   Current insulin regiment: none Most Recent Eye Exam: June 2023 Current exercise: none Current diet habits: diabetic  Pertinent Labs: Lab Results  Component Value Date   CHOL 137 04/13/2022   HDL 32 (L) 04/13/2022   LDLCALC 69 04/13/2022   TRIG 219 (H) 04/13/2022   CHOLHDL 4.3 04/13/2022   Lab Results  Component Value Date   NA 142 04/13/2022   K 5.0 04/13/2022   CREATININE 1.32 (H) 04/13/2022   EGFR 56 (L) 04/13/2022   MICROALBUR 50 01/15/2020   LABMICR 8.2 04/12/2022     ---------------------------------------------------------------------------------------------------   Medications: Outpatient Medications Prior to Visit  Medication Sig   amLODipine-benazepril (LOTREL) 5-40 MG capsule TAKE 1 CAPSULE  BY MOUTH ONCE DAILY   aspirin 81 MG chewable tablet Chew by mouth daily.   B-D UF III MINI PEN NEEDLES 31G X 5 MM MISC USE WITH PEN DAILY   Blood Glucose Monitoring Suppl (ONE TOUCH ULTRA SYSTEM KIT) w/Device KIT To use daily to check sugar. DX E11.9-needs one touch ultra meter   cholecalciferol (VITAMIN D) 25 MCG (1000 UT) tablet Take 1,000 Units by mouth daily.    clopidogrel (PLAVIX) 75 MG tablet TAKE 1 TABLET BY MOUTH ONCE A DAY   Coenzyme Q10 100 MG TABS Take 100 mg by mouth daily.    fluticasone (FLONASE) 50 MCG/ACT nasal spray USE 2 SPRAYS INTO EACH NOSTRIL EVERY DAY   ibuprofen (ADVIL) 800 MG tablet Take 1 tablet (800 mg total) by mouth every 8 (eight) hours as needed for mild pain or moderate pain.   JARDIANCE 25 MG TABS tablet TAKE 1 TABLET BY MOUTH ONCE DAILY   Lancets (ONETOUCH ULTRASOFT) lancets USE 1  TO CHECK GLUCOSE THREE TIMES DAILY   metFORMIN (GLUCOPHAGE) 500 MG tablet TAKE 1 TABLET BY MOUTH TWICE A DAY WITH A MEAL   metoprolol succinate (TOPROL-XL) 100 MG 24 hr tablet TAKE 1 TABLET BY MOUTH ONCE A DAY WITH OR IMMEDIATELY FOLLOWING A MEAL   montelukast (SINGULAIR) 10 MG tablet TAKE 1 TABLET BY MOUTH ONCE A DAY   omeprazole (PRILOSEC) 40 MG capsule TAKE 1 CAPSULE BY MOUTH ONCE DAILY   ONETOUCH ULTRA test strip USE 1 STRIP TO CHECK GLUCOSE  TWICE DAILY AS NEEDED   rosuvastatin (CRESTOR) 20 MG tablet TAKE 1 TABLET BY MOUTH ONCE A DAY   sildenafil (VIAGRA) 100 MG tablet Take 50 mg by mouth daily as needed for erectile dysfunction.   tadalafil (CIALIS) 5 MG tablet TAKE ONE TABLET BY MOUTH ONCE DAILY AS NEEDED FOR ERECTILE DYSFUNCTION   VICTOZA 18 MG/3ML SOPN INJECT 0.3 MLS (1.8 MG) UNDER THE SKIN ONCE DAILY   vitamin B-12 (CYANOCOBALAMIN) 1000 MCG tablet Take 1,000 mcg by mouth daily.   No facility-administered medications prior to visit.    Review of Systems  Constitutional:  Negative for fatigue and fever.  Respiratory:  Negative for cough and shortness of breath.    Cardiovascular:  Negative for chest pain, palpitations and leg swelling.  Neurological:  Negative for dizziness and headaches.      Objective    Blood pressure 133/61, pulse 64, height _0  (1.778 m), weight 247 lb 9 oz (112.3 kg), SpO2 96 %.   Physical Exam Constitutional:      General: He is awake.     Appearance: He is well-developed.  HENT:     Head: Normocephalic.  Eyes:     Conjunctiva/sclera: Conjunctivae normal.  Cardiovascular:     Rate and Rhythm: Normal rate and regular rhythm.     Heart sounds: Normal heart sounds.  Pulmonary:     Effort: Pulmonary effort is normal.     Breath sounds: Normal breath sounds.  Skin:    General: Skin is warm.  Neurological:     Mental Status: He is alert and oriented to person, place, and time.  Psychiatric:        Attention and Perception: Attention normal.        Mood and Affect: Mood normal.        Speech: Speech normal.        Behavior: Behavior is cooperative.     Results for orders placed or performed in visit on 08/10/22  POCT glycosylated hemoglobin (Hb A1C)  Result Value Ref Range   Hemoglobin A1C 8.0 (A) 4.0 - 5.6 %   HbA1c POC (<> result, manual entry)     HbA1c, POC (prediabetic range)     HbA1c, POC (controlled diabetic range)      Assessment & Plan     Problem List Items Addressed This Visit       Endocrine   Diabetes ([place]) - Primary    Prior A1c 7.7% today 8.0% Pt managing with metformin 500 mg bid, jardiance 25 mg daily, and victoza 1.8 mg daily. Discussed switching victoza to weekly mounjaro. Hoping for additional glycemic and weight loss benefit. For patient ease, knowing there will be an increase in sugars for a period of time-- stop victoza they day he starts mounjaro.  Advised of titration, will start at 2.5 mg and titrate up to 7.5 mg  Pt plans on following [name] to next practice. Advised that if this doesn't happen, I would want to f/u 3 mo . Pt sees podiatry for foot exam, vascular,  cardio.  Uacr utd.        Relevant Medications   tirzepatide [facility]) 2.5 MG/0.5ML Pen   Other Relevant Orders   POCT glycosylated hemoglobin (Hb A1C) (Completed)     Return in about 3 months (around 11/10/2022) for DMII.      I, Mikey Kirschner, PA-C have reviewed all documentation for this visit. The documentation on  08/10/2022 for the exam, diagnosis, procedures, and orders are all accurate  and complete.  Mikey Kirschner, PA-C [facility] [address] [place], [place], [address] Office: [phone] Fax: [place]

## 2022-08-11 ENCOUNTER — Telehealth: Payer: Self-pay | Admitting: Physician Assistant

## 2022-08-11 DIAGNOSIS — E1159 Type 2 diabetes mellitus with other circulatory complications: Secondary | ICD-10-CM

## 2022-08-11 NOTE — Telephone Encounter (Signed)
Sent to [facility] for a 90 day supply. [facility] did not have medication in stock.

## 2022-08-11 NOTE — Telephone Encounter (Signed)
Wife Andrew Lyons calling b/c the Rx tirzepatide [facility]) 2.5 MG/0.5ML Pen   Was to be  90 day supply.  When they got to pharmacy, it was only a 30 day. Pt would like that changed to a 90 day and sent to  [place], [place]   ([facility] does not have this medication)

## 2022-08-12 DIAGNOSIS — E119 Type 2 diabetes mellitus without complications: Secondary | ICD-10-CM | POA: Diagnosis not present

## 2022-08-12 LAB — HM DIABETES EYE EXAM

## 2022-08-15 NOTE — Telephone Encounter (Signed)
Patient advised. Verbalized understanding 

## 2022-08-15 NOTE — Telephone Encounter (Signed)
Wife calling back this morning b/c the med was not called in to [facility] as asked. Pt is out of doughnut hole, and requesting this be sent in for 90 days.  As the dr was supposed to send a 90 day to begin with. tirzepatide (MOUNJARO) 2.5 MG/0.5ML Pen   [place], [place]   Can you please let pt know so she can quit calling and running around everywhere.

## 2022-08-16 ENCOUNTER — Telehealth: Payer: Self-pay

## 2022-08-16 ENCOUNTER — Other Ambulatory Visit: Payer: Self-pay | Admitting: Physician Assistant

## 2022-08-16 DIAGNOSIS — E1159 Type 2 diabetes mellitus with other circulatory complications: Secondary | ICD-10-CM

## 2022-08-16 MED ORDER — TIRZEPATIDE 5 MG/0.5ML ~~LOC~~ SOAJ: 5.0000 mg | SUBCUTANEOUS | 0 refills | Status: DC

## 2022-08-16 NOTE — Telephone Encounter (Signed)
5 mg injection has been sent into [facility] for patient to pick up but he should not be using this injection until he has completed 4 doses of 2.5 mg . Rx for 7.5 mg will not be sent in until he has finished 2 doses of 5 mg injection. CRM created. Ok for [facility] to advise upon patient or patients wife returning call

## 2022-08-16 NOTE — Telephone Encounter (Signed)
Spoke with patient yesterday evening and advised that he needs to titrate up on this medication (like we discussed in the visit). There is no 90 day supply yet. You start at 2.5 mg for 4 doses then increase to 5 mg for 4 doses, and then to 7.5 mg where he can get 90 days.  Patient verbalized understanding

## 2022-08-16 NOTE — Telephone Encounter (Signed)
Spoke with Merrily Pew and states it was to another pharmacy due to not being in stock at [facility] and was sent to [facility] in [place] on [address] [phone])  Spoke with at [facility] on [facility] [address] [address] [facility] [address] [facility] and they stated patient picked up rx on 08/08/22

## 2022-08-17 ENCOUNTER — Ambulatory Visit: Payer: Self-pay | Admitting: Family Medicine

## 2022-08-17 NOTE — Telephone Encounter (Signed)
Reason for Disposition . [1] Follow-up call to recent contact AND [2] information only call, no triage required  Answer Assessment - Initial Assessment Questions 1. REASON FOR CALL or QUESTION: "What is your reason for calling today?" or "How can I best help you?" or "What question do you have that I can help answer?"     Andrew Lyons returned call.   I read her the message from dated 08/16/2022 at 4:07 PM from Orbie Hurst 'Taria, CMA to her.  She is concerned that next year they will not be able to afford this medication.  They are out of the donut hole for now so it's not costing them much.  She is requesting 90 day supplies and wants to get as much as possible before the end of the year.   I let her know from the message that the 7.5 mg would not be prescribed until the 5 mg doses were complete.   She verbalized understanding.  She is calling [facility] and having the rx transferred to [facility] because [facility]. Does not have and will not be getting any more of this medication.  This message has been sent to Mikey Kirschner, NP.  Protocols used: Information Only Call - No Triage-A-AH

## 2022-08-18 DIAGNOSIS — G4733 Obstructive sleep apnea (adult) (pediatric): Secondary | ICD-10-CM | POA: Diagnosis not present

## 2022-08-22 ENCOUNTER — Ambulatory Visit: Payer: PPO | Admitting: Family Medicine

## 2022-08-25 ENCOUNTER — Other Ambulatory Visit: Payer: Self-pay | Admitting: *Deleted

## 2022-08-25 DIAGNOSIS — E1159 Type 2 diabetes mellitus with other circulatory complications: Secondary | ICD-10-CM

## 2022-08-25 MED ORDER — BLOOD GLUCOSE METER KIT: PACK | 0 refills | Status: DC

## 2022-08-25 NOTE — Telephone Encounter (Signed)
Done

## 2022-08-25 NOTE — Telephone Encounter (Signed)
Copied from [place] [phone]. Topic: General - Other >> Aug 25, 2022  9:30 AM Everette C wrote: Reason for CRM: The patient's wife has called to request a prescription for a new diabetic testing meter  The patient is unable to regularly find lancets for their current meter and has had difficulty searching for lancets using third party sources  Please contact the patient's wife further when possible regarding their concerns

## 2022-09-05 ENCOUNTER — Telehealth: Payer: Self-pay | Admitting: Family Medicine

## 2022-09-05 ENCOUNTER — Other Ambulatory Visit: Payer: Self-pay | Admitting: Physician Assistant

## 2022-09-05 ENCOUNTER — Ambulatory Visit (INDEPENDENT_AMBULATORY_CARE_PROVIDER_SITE_OTHER): Payer: PPO | Admitting: Family Medicine

## 2022-09-05 ENCOUNTER — Encounter: Payer: Self-pay | Admitting: Family Medicine

## 2022-09-05 ENCOUNTER — Other Ambulatory Visit: Payer: Self-pay

## 2022-09-05 VITALS — BP 139/69 | HR 60 | Resp 16 | Wt 255.0 lb

## 2022-09-05 DIAGNOSIS — E118 Type 2 diabetes mellitus with unspecified complications: Secondary | ICD-10-CM

## 2022-09-05 DIAGNOSIS — J301 Allergic rhinitis due to pollen: Secondary | ICD-10-CM

## 2022-09-05 DIAGNOSIS — L304 Erythema intertrigo: Secondary | ICD-10-CM | POA: Diagnosis not present

## 2022-09-05 DIAGNOSIS — L03314 Cellulitis of groin: Secondary | ICD-10-CM

## 2022-09-05 DIAGNOSIS — I10 Essential (primary) hypertension: Secondary | ICD-10-CM

## 2022-09-05 MED ORDER — AMLODIPINE BESY-BENAZEPRIL HCL 5-40 MG PO CAPS: 1.0000 | ORAL_CAPSULE | Freq: Every day | ORAL | 1 refills | Status: AC

## 2022-09-05 MED ORDER — CEFDINIR 300 MG PO CAPS: 600.0000 mg | ORAL_CAPSULE | Freq: Every day | ORAL | 0 refills | Status: AC

## 2022-09-05 MED ORDER — KETOCONAZOLE 2 % EX CREA: 1.0000 | TOPICAL_CREAM | Freq: Two times a day (BID) | CUTANEOUS | 0 refills | Status: DC

## 2022-09-05 MED ORDER — MONTELUKAST SODIUM 10 MG PO TABS: 10.0000 mg | ORAL_TABLET | Freq: Every day | ORAL | 3 refills | Status: DC

## 2022-09-05 NOTE — Progress Notes (Signed)
I,[name],acting as a scribe for Lelon Huh, MD.,have documented all relevant documentation on the behalf of Lelon Huh, MD,as directed by  Lelon Huh, MD while in the presence of Lelon Huh, MD.   Established patient visit   Patient: Andrew Lyons   DOB: 1945/10/04   76 y.o. Male  MRN: [id number] Visit Date: 09/05/2022  Today's healthcare provider: Lelon Huh, MD   Chief Complaint  Patient presents with   Rash   Subjective    HPI  Patient has had a rash in groin area for about 4 days. Rash is red, has blisters and pus. Patient has been using epsom bath to treat rash and used some OTC topical anti-fungals the last couples days.   Medications: Outpatient Medications Prior to Visit  Medication Sig   amLODipine-benazepril (LOTREL) 5-40 MG capsule TAKE 1 CAPSULE BY MOUTH ONCE DAILY   aspirin 81 MG chewable tablet Chew by mouth daily.   B-D UF III MINI PEN NEEDLES 31G X 5 MM MISC USE WITH PEN DAILY   blood glucose meter kit and supplies Dispense based on patient and insurance preference. Use up to Two times daily as directed. (FOR ICD-10 E11.9, E11.59).   Blood Glucose Monitoring Suppl (ONE TOUCH ULTRA SYSTEM KIT) w/Device KIT To use daily to check sugar. DX E11.9-needs one touch ultra meter   cholecalciferol (VITAMIN D) 25 MCG (1000 UT) tablet Take 1,000 Units by mouth daily.    clopidogrel (PLAVIX) 75 MG tablet TAKE 1 TABLET BY MOUTH ONCE A DAY   Coenzyme Q10 100 MG TABS Take 100 mg by mouth daily.    fluticasone (FLONASE) 50 MCG/ACT nasal spray USE 2 SPRAYS INTO EACH NOSTRIL EVERY DAY   ibuprofen (ADVIL) 800 MG tablet Take 1 tablet (800 mg total) by mouth every 8 (eight) hours as needed for mild pain or moderate pain.   JARDIANCE 25 MG TABS tablet TAKE 1 TABLET BY MOUTH ONCE DAILY   Lancets (ONETOUCH ULTRASOFT) lancets USE 1  TO CHECK GLUCOSE THREE TIMES DAILY   metFORMIN (GLUCOPHAGE) 500 MG tablet TAKE 1 TABLET BY MOUTH TWICE A DAY WITH A MEAL    metoprolol succinate (TOPROL-XL) 100 MG 24 hr tablet TAKE 1 TABLET BY MOUTH ONCE A DAY WITH OR IMMEDIATELY FOLLOWING A MEAL   montelukast (SINGULAIR) 10 MG tablet TAKE 1 TABLET BY MOUTH ONCE A DAY   omeprazole (PRILOSEC) 40 MG capsule TAKE 1 CAPSULE BY MOUTH ONCE DAILY   ONETOUCH ULTRA test strip USE 1 STRIP TO CHECK GLUCOSE TWICE DAILY AS NEEDED   rosuvastatin (CRESTOR) 20 MG tablet TAKE 1 TABLET BY MOUTH ONCE A DAY   sildenafil (VIAGRA) 100 MG tablet Take 50 mg by mouth daily as needed for erectile dysfunction.   tadalafil (CIALIS) 5 MG tablet TAKE ONE TABLET BY MOUTH ONCE DAILY AS NEEDED FOR ERECTILE DYSFUNCTION   tirzepatide (MOUNJARO) 2.5 MG/0.5ML Pen Inject 2.5 mg into the skin once a week. For four weeks.   tirzepatide [facility]) 5 MG/0.5ML Pen Inject 5 mg into the skin once a week. Start AFTER four weeks of the 2.5 mg dose.   vitamin B-12 (CYANOCOBALAMIN) 1000 MCG tablet Take 1,000 mcg by mouth daily.   No facility-administered medications prior to visit.    Review of Systems  Constitutional:  Negative for appetite change, chills and fever.  Respiratory:  Negative for chest tightness, shortness of breath and wheezing.   Cardiovascular:  Negative for chest pain and palpitations.  Gastrointestinal:  Negative for abdominal  pain, nausea and vomiting.  Skin:  Positive for rash.       Objective    BP 139/69 (BP Location: Left Arm, Patient Position: Sitting, Cuff Size: Large)   Pulse 60   Resp 16   Wt 255 lb (115.7 kg)   SpO2 99%   BMI 36.59 kg/m    Physical Exam  Erythematous eruption left inguinal area with a few vesicular lesions.   Assessment & Plan     1. Cellulitis of groin  - cefdinir (OMNICEF) 300 MG capsule; Take 2 capsules (600 mg total) by mouth daily for 7 days.  Dispense: 14 capsule; Refill: 0  2. Intertrigo  - ketoconazole (NIZORAL) 2 % cream; Apply 1 Application topically 2 (two) times daily.  Dispense: 30 g; Refill: 0   Call if symptoms change or if not  rapidly improving.         The entirety of the information documented in the History of Present Illness, Review of Systems and Physical Exam were personally obtained by me. Portions of this information were initially documented by the CMA and reviewed by me for thoroughness and accuracy.     Lelon Huh, MD  [facility] [phone] (phone) [phone] (fax)  [place]

## 2022-09-05 NOTE — Telephone Encounter (Signed)
[facility] faxed refill request for the following medications:   amLODipine-benazepril (LOTREL) 5-40 MG capsule   montelukast (SINGULAIR) 10 MG tablet   Please advise.

## 2022-09-12 ENCOUNTER — Other Ambulatory Visit: Payer: Self-pay | Admitting: Family Medicine

## 2022-09-12 ENCOUNTER — Telehealth: Payer: Self-pay | Admitting: Physician Assistant

## 2022-09-12 DIAGNOSIS — E1159 Type 2 diabetes mellitus with other circulatory complications: Secondary | ICD-10-CM

## 2022-09-12 NOTE — Telephone Encounter (Signed)
[facility] [facility] faxed refill request for the following medications:   omeprazole (PRILOSEC) 40 MG capsule    Please advise

## 2022-09-13 MED ORDER — OMEPRAZOLE 40 MG PO CPDR: 40.0000 mg | DELAYED_RELEASE_CAPSULE | Freq: Every day | ORAL | 3 refills | Status: DC

## 2022-09-13 NOTE — Telephone Encounter (Signed)
Requested medication (s) are due for refill today - expired Rx  Requested medication (s) are on the active medication list -yes  Future visit scheduled -no  Last refill: 09/11/21 #90 3RF  Notes to clinic: expired Rx  Requested Prescriptions  Pending Prescriptions Disp Refills   omeprazole (PRILOSEC) 40 MG capsule 90 capsule 3    Sig: Take 1 capsule (40 mg total) by mouth daily.     Gastroenterology: Proton Pump Inhibitors Passed - 09/13/2022 10:17 AM      Passed - Valid encounter within last 12 months    Recent Outpatient Visits           1 week ago Cellulitis of groin   [facility] Birdie Sons, MD   1 month ago Type 2 diabetes mellitus with other circulatory complication, without long-term current use of insulin ([place])   [facility], [place], PA-C   3 months ago Annual physical exam   [facility] [name]., MD   5 months ago Type 2 diabetes mellitus with other circulatory complication, without long-term current use of insulin [facility])   [facility] [name]., MD   7 months ago Obstructive apnea   [facility] [name]., MD       Future Appointments             In 1 month Ralene Bathe, MD [place]               Requested Prescriptions  Pending Prescriptions Disp Refills   omeprazole (PRILOSEC) 40 MG capsule 90 capsule 3    Sig: Take 1 capsule (40 mg total) by mouth daily.     Gastroenterology: Proton Pump Inhibitors Passed - 09/13/2022 10:17 AM      Passed - Valid encounter within last 12 months    Recent Outpatient Visits           1 week ago Cellulitis of groin   [facility] Birdie Sons, MD   1 month ago Type 2 diabetes mellitus with other circulatory complication, without long-term current use of insulin ([place])   [facility] Mikey Kirschner, PA-C   3 months ago Annual physical exam    [facility] [name]., MD   5 months ago Type 2 diabetes mellitus with other circulatory complication, without long-term current use of insulin [facility])   [facility] [name]., MD   7 months ago Obstructive apnea   [facility] [name]., MD       Future Appointments             In 1 month [name] Monia Sabal, MD [place]

## 2022-09-13 NOTE — Telephone Encounter (Signed)
Pt called to get the increased dose of '7MG'$  for [facility] / he stated he had the '5MG'$  last time and thought he is suppose to increase each time / please advise

## 2022-09-13 NOTE — Telephone Encounter (Signed)
Already ordered

## 2022-09-13 NOTE — Telephone Encounter (Signed)
Medication Refill - Medication: omeprazole (PRILOSEC) 40 MG capsule   Has the patient contacted their pharmacy? Yes.   (Agent: If no, request that the patient contact the pharmacy for the refill. If patient does not wish to contact the pharmacy document the reason why and proceed with request.) (Agent: If yes, when and what did the pharmacy advise?)  Preferred Pharmacy (with phone number or street name):  [facility] [place] Phone: [phone]  Fax: [phone]     Has the patient been seen for an appointment in the last year OR does the patient have an upcoming appointment? Yes.    Agent: Please be advised that RX refills may take up to 3 business days. We ask that you follow-up with your pharmacy.

## 2022-09-16 MED ORDER — TIRZEPATIDE 7.5 MG/0.5ML ~~LOC~~ SOAJ: 7.5000 mg | SUBCUTANEOUS | 1 refills | Status: DC

## 2022-09-16 NOTE — Telephone Encounter (Signed)
Reviewed note from last office visit. Dose of Moujaro to be titrated up to 7.'5mg'$  weekly per last DM plan.   Increased dose sent to Sarah Ann, MD  [facility]

## 2022-09-16 NOTE — Addendum Note (Signed)
Addended by: Adolph Pollack on: 09/16/2022 01:52 PM   Modules accepted: Orders

## 2022-09-17 DIAGNOSIS — G4733 Obstructive sleep apnea (adult) (pediatric): Secondary | ICD-10-CM | POA: Diagnosis not present

## 2022-10-10 ENCOUNTER — Telehealth: Payer: Self-pay | Admitting: Family Medicine

## 2022-10-10 NOTE — Telephone Encounter (Signed)
[facility] faxed refill request for the following medications:   fluticasone (FLONASE) 50 MCG/ACT nasal spray    Please advise

## 2022-10-11 MED ORDER — FLUTICASONE PROPIONATE 50 MCG/ACT NA SUSP: NASAL | 5 refills | Status: AC

## 2022-10-12 DIAGNOSIS — G4733 Obstructive sleep apnea (adult) (pediatric): Secondary | ICD-10-CM | POA: Diagnosis not present

## 2022-10-18 DIAGNOSIS — G4733 Obstructive sleep apnea (adult) (pediatric): Secondary | ICD-10-CM | POA: Diagnosis not present

## 2022-10-26 ENCOUNTER — Ambulatory Visit: Payer: PPO | Admitting: Dermatology

## 2022-10-26 VITALS — BP 136/70 | HR 73

## 2022-10-26 DIAGNOSIS — D229 Melanocytic nevi, unspecified: Secondary | ICD-10-CM | POA: Diagnosis not present

## 2022-10-26 DIAGNOSIS — B353 Tinea pedis: Secondary | ICD-10-CM

## 2022-10-26 DIAGNOSIS — Z1283 Encounter for screening for malignant neoplasm of skin: Secondary | ICD-10-CM | POA: Diagnosis not present

## 2022-10-26 DIAGNOSIS — L918 Other hypertrophic disorders of the skin: Secondary | ICD-10-CM

## 2022-10-26 DIAGNOSIS — L814 Other melanin hyperpigmentation: Secondary | ICD-10-CM | POA: Diagnosis not present

## 2022-10-26 DIAGNOSIS — Z79899 Other long term (current) drug therapy: Secondary | ICD-10-CM

## 2022-10-26 DIAGNOSIS — Z86018 Personal history of other benign neoplasm: Secondary | ICD-10-CM

## 2022-10-26 DIAGNOSIS — D692 Other nonthrombocytopenic purpura: Secondary | ICD-10-CM | POA: Diagnosis not present

## 2022-10-26 DIAGNOSIS — L821 Other seborrheic keratosis: Secondary | ICD-10-CM | POA: Diagnosis not present

## 2022-10-26 DIAGNOSIS — D485 Neoplasm of uncertain behavior of skin: Secondary | ICD-10-CM | POA: Diagnosis not present

## 2022-10-26 DIAGNOSIS — D1809 Hemangioma of other sites: Secondary | ICD-10-CM | POA: Diagnosis not present

## 2022-10-26 DIAGNOSIS — L578 Other skin changes due to chronic exposure to nonionizing radiation: Secondary | ICD-10-CM | POA: Diagnosis not present

## 2022-10-26 DIAGNOSIS — D492 Neoplasm of unspecified behavior of bone, soft tissue, and skin: Secondary | ICD-10-CM

## 2022-10-26 MED ORDER — KETOCONAZOLE 2 % EX CREA: TOPICAL_CREAM | CUTANEOUS | 6 refills | Status: DC

## 2022-10-26 NOTE — Patient Instructions (Addendum)
Wound Care Instructions  Cleanse wound gently with soap and water once a day then pat dry with clean gauze. Apply a thin coat of Petrolatum (petroleum jelly, "Vaseline") over the wound (unless you have an allergy to this). We recommend that you use a new, sterile tube of Vaseline. Do not pick or remove scabs. Do not remove the yellow or white "healing tissue" from the base of the wound.  Cover the wound with fresh, clean, nonstick gauze and secure with paper tape. You may use Band-Aids in place of gauze and tape if the wound is small enough, but would recommend trimming much of the tape off as there is often too much. Sometimes Band-Aids can irritate the skin.  You should call the office for your biopsy report after 1 week if you have not already been contacted.  If you experience any problems, such as abnormal amounts of bleeding, swelling, significant bruising, significant pain, or evidence of infection, please call the office immediately.  FOR ADULT SURGERY PATIENTS: If you need something for pain relief you may take 1 extra strength Tylenol (acetaminophen) AND 2 Ibuprofen (200mg each) together every 4 hours as needed for pain. (do not take these if you are allergic to them or if you have a reason you should not take them.) Typically, you may only need pain medication for 1 to 3 days.     Due to recent changes in healthcare laws, you may see results of your pathology and/or laboratory studies on MyChart before the doctors have had a chance to review them. We understand that in some cases there may be results that are confusing or concerning to you. Please understand that not all results are received at the same time and often the doctors may need to interpret multiple results in order to provide you with the best plan of care or course of treatment. Therefore, we ask that you please give us 2 business days to thoroughly review all your results before contacting the office for clarification. Should  we see a critical lab result, you will be contacted sooner.   If You Need Anything After Your Visit  If you have any questions or concerns for your doctor, please call our main line at [phone] and press option 4 to reach your doctor's medical assistant. If no one answers, please leave a voicemail as directed and we will return your call as soon as possible. Messages left after 4 pm will be answered the following business day.   You may also send us a message via MyChart. We typically respond to MyChart messages within 1-2 business days.  For prescription refills, please ask your pharmacy to contact our office. Our fax number is [phone].  If you have an urgent issue when the clinic is closed that cannot wait until the next business day, you can page your doctor at the number below.    Please note that while we do our best to be available for urgent issues outside of office hours, we are not available 24/7.   If you have an urgent issue and are unable to reach us, you may choose to seek medical care at your doctor's office, retail clinic, urgent care center, or emergency room.  If you have a medical emergency, please immediately call 911 or go to the emergency department.  Pager Numbers  - Dr. Kowalski: [phone]  - Dr. Moye: [phone]  - Dr. Stewart: [phone]  In the event of inclement weather, please call our main line at   [phone] for an update on the status of any delays or closures.  Dermatology Medication Tips: Please keep the boxes that topical medications come in in order to help keep track of the instructions about where and how to use these. Pharmacies typically print the medication instructions only on the boxes and not directly on the medication tubes.   If your medication is too expensive, please contact our office at [phone] option 4 or send us a message through MyChart.   We are unable to tell what your co-pay for medications will be in  advance as this is different depending on your insurance coverage. However, we may be able to find a substitute medication at lower cost or fill out paperwork to get insurance to cover a needed medication.   If a prior authorization is required to get your medication covered by your insurance company, please allow us 1-2 business days to complete this process.  Drug prices often vary depending on where the prescription is filled and some pharmacies may offer cheaper prices.  The website www.goodrx.com contains coupons for medications through different pharmacies. The prices here do not account for what the cost may be with help from insurance (it may be cheaper with your insurance), but the website can give you the price if you did not use any insurance.  - You can print the associated coupon and take it with your prescription to the pharmacy.  - You may also stop by our office during regular business hours and pick up a GoodRx coupon card.  - If you need your prescription sent electronically to a different pharmacy, notify our office through [place] MyChart or by phone at [phone] option 4.     Si Usted Necesita Algo Despus de Su Visita  Tambin puede enviarnos un mensaje a travs de MyChart. Por lo general respondemos a los mensajes de MyChart en el transcurso de 1 a 2 das hbiles.  Para renovar recetas, por favor pida a su farmacia que se ponga en contacto con nuestra oficina. Nuestro nmero de fax es el [phone].  Si tiene un asunto urgente cuando la clnica est cerrada y que no puede esperar hasta el siguiente da hbil, puede llamar/localizar a su doctor(a) al nmero que aparece a continuacin.   Por favor, tenga en cuenta que aunque hacemos todo lo posible para estar disponibles para asuntos urgentes fuera del horario de oficina, no estamos disponibles las 24 horas del da, los 7 das de la semana.   Si tiene un problema urgente y no puede comunicarse con nosotros, puede  optar por buscar atencin mdica  en el consultorio de su doctor(a), en una clnica privada, en un centro de atencin urgente o en una sala de emergencias.  Si tiene una emergencia mdica, por favor llame inmediatamente al 911 o vaya a la sala de emergencias.  Nmeros de bper  - Dr. Kowalski: [phone]  - Dra. Moye: [phone]  - Dra. Stewart: [phone]  En caso de inclemencias del tiempo, por favor llame a nuestra lnea principal al [phone] para una actualizacin sobre el estado de cualquier retraso o cierre.  Consejos para la medicacin en dermatologa: Por favor, guarde las cajas en las que vienen los medicamentos de uso tpico para ayudarle a seguir las instrucciones sobre dnde y cmo usarlos. Las farmacias generalmente imprimen las instrucciones del medicamento slo en las cajas y no directamente en los tubos del medicamento.   Si su medicamento es muy caro, por favor, pngase en contacto con   nuestra oficina llamando al [phone] y presione la opcin 4 o envenos un mensaje a travs de MyChart.   No podemos decirle cul ser su copago por los medicamentos por adelantado ya que esto es diferente dependiendo de la cobertura de su seguro. Sin embargo, es posible que podamos encontrar un medicamento sustituto a menor costo o llenar un formulario para que el seguro cubra el medicamento que se considera necesario.   Si se requiere una autorizacin previa para que su compaa de seguros cubra su medicamento, por favor permtanos de 1 a 2 das hbiles para completar este proceso.  Los precios de los medicamentos varan con frecuencia dependiendo del lugar de dnde se surte la receta y alguna farmacias pueden ofrecer precios ms baratos.  El sitio web www.goodrx.com tiene cupones para medicamentos de diferentes farmacias. Los precios aqu no tienen en cuenta lo que podra costar con la ayuda del seguro (puede ser ms barato con su seguro), pero el sitio web puede darle el  precio si no utiliz ningn seguro.  - Puede imprimir el cupn correspondiente y llevarlo con su receta a la farmacia.  - Tambin puede pasar por nuestra oficina durante el horario de atencin regular y recoger una tarjeta de cupones de GoodRx.  - Si necesita que su receta se enve electrnicamente a una farmacia diferente, informe a nuestra oficina a travs de MyChart de [place] o por telfono llamando al [phone] y presione la opcin 4.  

## 2022-10-26 NOTE — Progress Notes (Signed)
Follow-Up Visit   Subjective  Andrew Lyons is a 77 y.o. male who presents for the following: Annual Exam.hx of Dysplastic nevus.  The patient presents for Total-Body Skin Exam (TBSE) for skin cancer screening and mole check.  The patient has spots, moles and lesions to be evaluated, some may be new or changing and the patient has concerns that these could be cancer.   The following portions of the chart were reviewed this encounter and updated as appropriate:   Tobacco  Allergies  Meds  Problems  Med Hx  Surg Hx  Fam Hx     Review of Systems:  No other skin or systemic complaints except as noted in HPI or Assessment and Plan.  Objective  Well appearing patient in no apparent distress; mood and affect are within normal limits.  A full examination was performed including scalp, head, eyes, ears, nose, lips, neck, chest, axillae, abdomen, back, buttocks, bilateral upper extremities, bilateral lower extremities, hands, feet, fingers, toes, fingernails, and toenails. All findings within normal limits unless otherwise noted below.  foot Scaling and maceration web spaces and over distal and lateral soles.   right posterior lateral thigh 0.6 cm irritated red papule         Assessment & Plan  Tinea pedis of Left foot foot Chronic and persistent condition with duration or expected duration over one year. Condition is symptomatic / bothersome to patient. Not to goal.   Start Ketoconazole cream apply to entire foot at bedtime  May consider topical nail treatment in the future   Related Medications ketoconazole (NIZORAL) 2 % cream Apply to entire feet at bedtime  Neoplasm of skin right posterior lateral thigh Epidermal / dermal shaving  Lesion diameter (cm):  0.6 Informed consent: discussed and consent obtained   Timeout: patient name, date of birth, surgical site, and procedure verified   Procedure prep:  Patient was prepped and draped in usual sterile  fashion Prep type:  Isopropyl alcohol Anesthesia: the lesion was anesthetized in a standard fashion   Anesthetic:  1% lidocaine w/ epinephrine 1-100,000 buffered w/ 8.4% NaHCO3 Hemostasis achieved with: pressure, aluminum chloride and electrodesiccation   Outcome: patient tolerated procedure well   Post-procedure details: sterile dressing applied and wound care instructions given   Dressing type: bandage and petrolatum    Specimen 1 - Surgical pathology Differential Diagnosis: R/O Irritated Hemangioma vs other  Check Margins: No  Lentigines - Scattered tan macules - Due to sun exposure - Benign-appearing, observe - Recommend daily broad spectrum sunscreen SPF 30+ to sun-exposed areas, reapply every 2 hours as needed. - Call for any changes  Seborrheic Keratoses - Stuck-on, waxy, tan-brown papules and/or plaques  - Benign-appearing - Discussed benign etiology and prognosis. - Observe - Call for any changes  Melanocytic Nevi - Tan-brown and/or pink-flesh-colored symmetric macules and papules - Benign appearing on exam today - Observation - Call clinic for new or changing moles - Recommend daily use of broad spectrum spf 30+ sunscreen to sun-exposed areas.   Hemangiomas - Red papules - Discussed benign nature - Observe - Call for any changes  Actinic Damage - Chronic condition, secondary to cumulative UV/sun exposure - diffuse scaly erythematous macules with underlying dyspigmentation - Recommend daily broad spectrum sunscreen SPF 30+ to sun-exposed areas, reapply every 2 hours as needed.  - Staying in the shade or wearing long sleeves, sun glasses (UVA+UVB protection) and wide brim hats (4-inch brim around the entire circumference of the hat) are also recommended for  sun protection.  - Call for new or changing lesions.  Purpura - Chronic; persistent and recurrent.  Treatable, but not curable. Hands  - Violaceous macules and patches - Benign - Related to trauma, age,  sun damage and/or use of blood thinners, chronic use of topical and/or oral steroids - Observe - Can use OTC arnica containing moisturizer such as Dermend Bruise Formula if desired - Call for worsening or other concerns   Acrochordons (Skin Tags) - Fleshy, skin-colored pedunculated papules - Benign appearing.  - Observe. - If desired, they can be removed with an in office procedure that is not covered by insurance. - Please call the clinic if you notice any new or changing lesions.   History of Dysplastic Nevi Multiple see history  - No evidence of recurrence today - Recommend regular full body skin exams - Recommend daily broad spectrum sunscreen SPF 30+ to sun-exposed areas, reapply every 2 hours as needed.  - Call if any new or changing lesions are noted between office visits   Skin cancer screening performed today.   Return in about 1 year (around 10/27/2023) for TBSE, hx of Dysplastic nevus .  IMarye Round, CMA, am acting as scribe for Sarina Ser, MD .  Documentation: I have reviewed the above documentation for accuracy and completeness, and I agree with the above.  Sarina Ser, MD

## 2022-10-28 ENCOUNTER — Telehealth: Payer: Self-pay

## 2022-10-28 NOTE — Telephone Encounter (Signed)
Copied from [place] [id number] [id number]. Topic: General - Other >> Oct 28, 2022  9:39 AM Sabas Sous wrote: Reason for CRM:  Ray from [facility] called to report that they are missing information on the recent fax from PCP, they are missing initials and dates from PCP. Fax sent February 1.   Fax: [phone] Best contact: [phone]

## 2022-10-29 ENCOUNTER — Encounter: Payer: Self-pay | Admitting: Dermatology

## 2022-11-01 DIAGNOSIS — I10 Essential (primary) hypertension: Secondary | ICD-10-CM | POA: Diagnosis not present

## 2022-11-01 DIAGNOSIS — I25118 Atherosclerotic heart disease of native coronary artery with other forms of angina pectoris: Secondary | ICD-10-CM | POA: Diagnosis not present

## 2022-11-01 DIAGNOSIS — Z952 Presence of prosthetic heart valve: Secondary | ICD-10-CM | POA: Diagnosis not present

## 2022-11-01 DIAGNOSIS — I739 Peripheral vascular disease, unspecified: Secondary | ICD-10-CM | POA: Diagnosis not present

## 2022-11-01 DIAGNOSIS — E119 Type 2 diabetes mellitus without complications: Secondary | ICD-10-CM | POA: Diagnosis not present

## 2022-11-01 DIAGNOSIS — Z1329 Encounter for screening for other suspected endocrine disorder: Secondary | ICD-10-CM | POA: Diagnosis not present

## 2022-11-01 DIAGNOSIS — K2270 Barrett's esophagus without dysplasia: Secondary | ICD-10-CM | POA: Diagnosis not present

## 2022-11-01 DIAGNOSIS — G4733 Obstructive sleep apnea (adult) (pediatric): Secondary | ICD-10-CM | POA: Diagnosis not present

## 2022-11-01 DIAGNOSIS — Z89511 Acquired absence of right leg below knee: Secondary | ICD-10-CM | POA: Diagnosis not present

## 2022-11-01 DIAGNOSIS — E782 Mixed hyperlipidemia: Secondary | ICD-10-CM | POA: Diagnosis not present

## 2022-11-01 DIAGNOSIS — E118 Type 2 diabetes mellitus with unspecified complications: Secondary | ICD-10-CM | POA: Diagnosis not present

## 2022-11-02 ENCOUNTER — Telehealth: Payer: Self-pay

## 2022-11-02 NOTE — Telephone Encounter (Signed)
Patient informed of pathology results 

## 2022-11-02 NOTE — Telephone Encounter (Signed)
-----   Message from Ralene Bathe, MD sent at 11/02/2022 12:30 PM EST ----- Diagnosis Skin , right posterior lateral thigh IRRITATED HEMANGIOMA WITH FOCAL THROMBUS FORMATION  Benign hemangioma (= collection of blood vessels) With thrombus No further treatment needed

## 2022-11-14 DIAGNOSIS — E118 Type 2 diabetes mellitus with unspecified complications: Secondary | ICD-10-CM | POA: Diagnosis not present

## 2022-11-18 DIAGNOSIS — G4733 Obstructive sleep apnea (adult) (pediatric): Secondary | ICD-10-CM | POA: Diagnosis not present

## 2022-11-28 DIAGNOSIS — I10 Essential (primary) hypertension: Secondary | ICD-10-CM | POA: Diagnosis not present

## 2022-11-28 DIAGNOSIS — G4733 Obstructive sleep apnea (adult) (pediatric): Secondary | ICD-10-CM | POA: Diagnosis not present

## 2022-11-28 DIAGNOSIS — I25118 Atherosclerotic heart disease of native coronary artery with other forms of angina pectoris: Secondary | ICD-10-CM | POA: Diagnosis not present

## 2022-11-28 DIAGNOSIS — I739 Peripheral vascular disease, unspecified: Secondary | ICD-10-CM | POA: Diagnosis not present

## 2022-11-28 DIAGNOSIS — E1129 Type 2 diabetes mellitus with other diabetic kidney complication: Secondary | ICD-10-CM | POA: Diagnosis not present

## 2022-11-28 DIAGNOSIS — I6523 Occlusion and stenosis of bilateral carotid arteries: Secondary | ICD-10-CM | POA: Diagnosis not present

## 2022-11-28 DIAGNOSIS — N401 Enlarged prostate with lower urinary tract symptoms: Secondary | ICD-10-CM | POA: Diagnosis not present

## 2022-11-28 DIAGNOSIS — Z952 Presence of prosthetic heart valve: Secondary | ICD-10-CM | POA: Diagnosis not present

## 2022-11-28 DIAGNOSIS — R809 Proteinuria, unspecified: Secondary | ICD-10-CM | POA: Diagnosis not present

## 2022-11-28 DIAGNOSIS — E782 Mixed hyperlipidemia: Secondary | ICD-10-CM | POA: Diagnosis not present

## 2022-11-28 DIAGNOSIS — R3911 Hesitancy of micturition: Secondary | ICD-10-CM | POA: Diagnosis not present

## 2022-12-05 ENCOUNTER — Other Ambulatory Visit: Payer: Self-pay | Admitting: Physician Assistant

## 2022-12-05 ENCOUNTER — Other Ambulatory Visit: Payer: Self-pay | Admitting: Family Medicine

## 2022-12-05 DIAGNOSIS — I10 Essential (primary) hypertension: Secondary | ICD-10-CM

## 2022-12-05 DIAGNOSIS — E118 Type 2 diabetes mellitus with unspecified complications: Secondary | ICD-10-CM

## 2022-12-17 DIAGNOSIS — G4733 Obstructive sleep apnea (adult) (pediatric): Secondary | ICD-10-CM | POA: Diagnosis not present

## 2022-12-21 DIAGNOSIS — I6523 Occlusion and stenosis of bilateral carotid arteries: Secondary | ICD-10-CM | POA: Diagnosis not present

## 2022-12-26 ENCOUNTER — Ambulatory Visit: Payer: PPO | Admitting: Urology

## 2022-12-26 ENCOUNTER — Encounter: Payer: Self-pay | Admitting: Urology

## 2022-12-26 VITALS — BP 129/58 | HR 71 | Ht 70.0 in | Wt 251.0 lb

## 2022-12-26 DIAGNOSIS — N401 Enlarged prostate with lower urinary tract symptoms: Secondary | ICD-10-CM | POA: Diagnosis not present

## 2022-12-26 LAB — URINALYSIS, COMPLETE
Bilirubin, UA: NEGATIVE
Ketones, UA: NEGATIVE
Leukocytes,UA: NEGATIVE
Nitrite, UA: NEGATIVE
Protein,UA: NEGATIVE
RBC, UA: NEGATIVE
Specific Gravity, UA: <1.005 — ABNORMAL LOW (ref 1.005–1.030)
Urobilinogen, Ur: 0.2 mg/dL (ref 0.2–1.0)
pH, UA: 5 (ref 5.0–7.5)

## 2022-12-26 LAB — MICROSCOPIC EXAMINATION

## 2022-12-26 LAB — BLADDER SCAN AMB NON-IMAGING: Scan Result: 79

## 2022-12-26 MED ORDER — SILODOSIN 8 MG PO CAPS: 8.0000 mg | ORAL_CAPSULE | Freq: Every day | ORAL | 1 refills | Status: DC

## 2022-12-26 NOTE — Progress Notes (Signed)
I, DeAsia L Maxie,acting as a scribe for Abbie Sons, MD.,have documented all relevant documentation on the behalf of Abbie Sons, MD,as directed by  Abbie Sons, MD while in the presence of Abbie Sons, MD.   Haze Rushing Plume,acting as a scribe for Abbie Sons, MD.,have documented all relevant documentation on the behalf of Abbie Sons, MD,as directed by  Abbie Sons, MD while in the presence of Abbie Sons, MD.  12/26/2022 10:58 AM   Andrew Lyons Feb 18, 1946 [id number]  Referring provider: Ebbie Ridge, MD [address],  [place] [address]  Chief Complaint  Patient presents with   Over Active Bladder    HPI: Andrew Lyons is a 77 y.o. male who presents with prostate enlargement with LUTS.  Previously saw Dr. Matilde Sprang in 2016 and 2017 for LUTS. He was on Tamsulosin and was started on Vesicare for nocturia times 3 which was not helpful.  He underwent PVP with Dr. Yves Dill in 2020; UroLift was performed prior to PVP however did not improve symptoms He reports nocturia 4-5x History of sleep apnea, for which he uses a CPAP machine.  Complains of an increase in urine odor and experiences urinary hesitancy, particularly at night. IPSS 35/35   PMH: 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    Barrett's esophagus with dysplasia    Cataract    COPD (chronic obstructive pulmonary disease)    Coronary artery disease    Diabetes mellitus without complication    GERD (gastroesophageal reflux disease)    Hepatitis    Hx of dysplastic nevus 10/19/2006   L upper back, medial to mid scapula, slight to moderate atypia   Hx of dysplastic nevus 03/18/2008   R upper back 4.0cm lat to spine, moderate to marked atypia, excised 04/15/2008   Hx of dysplastic nevus 09/22/2008   R mid back 5.0cm lat to spine, moderate atypia   Hx of dysplastic nevus 04/06/2009   L mid to low back 11.0cm lat  to spine, mild atypia   Hyperlipidemia    Hypertension    Myocardial infarction    Seizures    Sleep apnea     Surgical History: Past Surgical History:  Procedure Laterality Date   ABDOMINAL AORTOGRAM W/LOWER EXTREMITY N/A 06/20/2018   Procedure: ABDOMINAL AORTOGRAM W/LOWER EXTREMITY;  Surgeon: Wellington Hampshire, MD;  Location: [facility];  Service: Cardiovascular;  Laterality: N/A;   AMPUTATION Right 10/27/2018   Procedure: AMPUTATION BELOW KNEE;  Surgeon: Leim Fabry, MD;  Location: [facility];  Service: Orthopedics;  Laterality: Right;   CARDIAC CATHETERIZATION     with Angioplasty   cardiac stents     CARDIAC VALVE REPLACEMENT     Aortic Valve Replacement   COLONOSCOPY N/A 03/26/2020   Procedure: COLONOSCOPY;  Surgeon: Benjamine Sprague, DO;  Location: [facility];  Service: General;  Laterality: N/A;   COLONOSCOPY WITH PROPOFOL N/A 03/18/2015   Procedure: COLONOSCOPY WITH PROPOFOL;  Surgeon: [name], MD;  Location[facility];  Service: Endoscopy;  Laterality: N/A;   ESOPHAGOGASTRODUODENOSCOPY N/A 03/18/2015   Procedure: ESOPHAGOGASTRODUODENOSCOPY (EGD);  Surgeon: [name], MD;  Location: [facility] ENDOSCOPY;  Service: Endoscopy;  Laterality: N/A;   FOOT AMPUTATION     GREEN LIGHT LASER TURP (TRANSURETHRAL RESECTION OF PROSTATE N/A 10/16/2018   Procedure: GREEN LIGHT LASER TURP (TRANSURETHRAL RESECTION OF PROSTATE;  Surgeon: Royston Cowper, MD;  Location: [facility];  Service: Urology;  Laterality: N/A;   HARDWARE REMOVAL Right 10/27/2018   Procedure: HARDWARE REMOVAL;  Surgeon: Leim Fabry, MD;  Location: [facility];  Service: Orthopedics;  Laterality: Right;   HEMORRHOID SURGERY N/A 03/26/2020   Procedure: HEMORRHOIDECTOMY;  Surgeon: Benjamine Sprague, DO;  Location: [facility];  Service: General;  Laterality: N/A;   LOWER EXTREMITY ANGIOGRAPHY Right 10/25/2018   Procedure: Lower Extremity Angiography;  Surgeon: Algernon Huxley, MD;  Location: [facility];  Service:  Cardiovascular;  Laterality: Right;   PERIPHERAL VASCULAR INTERVENTION Right 06/20/2018   Procedure: PERIPHERAL VASCULAR INTERVENTION;  Surgeon: Wellington Hampshire, MD;  Location: [facility];  Service: Cardiovascular;  Laterality: Right;   THORACOTOMY Right    TONSILLECTOMY      Home Medications:  Allergies as of 12/26/2022       Reactions   Iodinated Contrast Media Rash   had a red chest -unusre of what kind of dye it was   Zyban [bupropion] Rash   Zocor [simvastatin] Other (See Comments)   Reaction: hepatitis    Gadolinium Derivatives Rash        Medication List        Accurate as of December 26, 2022 10:58 AM. If you have any questions, ask your nurse or doctor.          STOP taking these medications    aspirin 81 MG chewable tablet Stopped by: Abbie Sons, MD   ibuprofen 800 MG tablet Commonly known as: ADVIL Stopped by: Abbie Sons, MD       TAKE these medications    amLODipine-benazepril 5-40 MG capsule Commonly known as: LOTREL Take 1 capsule by mouth daily.   B-D UF III MINI PEN NEEDLES 31G X 5 MM Misc Generic drug: Insulin Pen Needle USE WITH PEN DAILY   blood glucose meter kit and supplies Dispense based on patient and insurance preference. Use up to Two times daily as directed. (FOR ICD-10 E11.9, E11.59).   cholecalciferol 25 MCG (1000 UT) tablet Generic drug: Cholecalciferol Take 1,000 Units by mouth daily.   clopidogrel 75 MG tablet Commonly known as: PLAVIX TAKE 1 TABLET BY MOUTH ONCE A DAY   Coenzyme Q10 100 MG Tabs Take 100 mg by mouth daily.   cyanocobalamin 1000 MCG tablet Commonly known as: VITAMIN B12 Take 1,000 mcg by mouth daily.   fluticasone 50 MCG/ACT nasal spray Commonly known as: FLONASE USE 2 SPRAYS INTO EACH NOSTRIL EVERY DAY   Jardiance 25 MG Tabs tablet Generic drug: empagliflozin TAKE 1 TABLET BY MOUTH ONCE DAILY   ketoconazole 2 % cream Commonly known as: NIZORAL Apply 1 Application topically 2  (two) times daily.   ketoconazole 2 % cream Commonly known as: NIZORAL Apply to entire feet at bedtime   metFORMIN 500 MG tablet Commonly known as: GLUCOPHAGE TAKE 1 TABLET BY MOUTH TWICE A DAY WITH A MEAL   metoprolol succinate 100 MG 24 hr tablet Commonly known as: TOPROL-XL TAKE 1 TABLET BY MOUTH ONCE A DAY WITH OR IMMEDIATELY FOLLOWING A MEAL   montelukast 10 MG tablet Commonly known as: SINGULAIR Take 1 tablet (10 mg total) by mouth daily.   omeprazole 40 MG capsule Commonly known as: PRILOSEC Take 1 capsule (40 mg total) by mouth daily.   ONE TOUCH ULTRA SYSTEM KIT w/Device Kit To use daily to check sugar. DX E11.9-needs one touch ultra meter   OneTouch Ultra test strip Generic drug: glucose blood USE 1 STRIP  TO CHECK GLUCOSE TWICE DAILY AS NEEDED   onetouch ultrasoft lancets USE 1  TO CHECK GLUCOSE THREE TIMES DAILY   rosuvastatin 20 MG tablet Commonly known as: CRESTOR TAKE 1 TABLET BY MOUTH ONCE A DAY   sildenafil 100 MG tablet Commonly known as: VIAGRA Take 50 mg by mouth daily as needed for erectile dysfunction.   silodosin 8 MG Caps capsule Commonly known as: RAPAFLO Take 1 capsule (8 mg total) by mouth daily with breakfast. Started by: Abbie Sons, MD   tadalafil 5 MG tablet Commonly known as: CIALIS TAKE ONE TABLET BY MOUTH ONCE DAILY AS NEEDED FOR ERECTILE DYSFUNCTION   tirzepatide 7.5 MG/0.5ML Pen Commonly known as: MOUNJARO Inject 7.5 mg into the skin once a week.        Allergies:  Allergies  Allergen Reactions   Iodinated Contrast Media Rash    had a red chest -unusre of what kind of dye it was   Zyban [Bupropion] Rash   Zocor [Simvastatin] Other (See Comments)    Reaction: hepatitis    Gadolinium Derivatives Rash    Family History: Family History  Problem Relation Age of Onset   Obesity Son    Diabetes Brother    Hypertension Brother    Heart attack Mother    Heart attack Father     Social History:  reports that he  quit smoking about 21 years ago. His smoking use included cigarettes. He has quit using smokeless tobacco. He reports that he does not currently use alcohol. He reports that he does not use drugs.   Physical Exam: BP (!) 129/58   Pulse 71   Ht 5\' 10"  (1.778 m)   Wt 251 lb (113.9 kg)   BMI 36.01 kg/m   Constitutional:  Alert and oriented, No acute distress. HEENT: [place] AT Neurologic: Grossly intact, no focal deficits, moving all 4 extremities. Psychiatric: Normal mood and affect.  Laboratory Data:  Urinalysis UA dipstick 3+ glucose. Microscopy negative  Assessment & Plan:    1. BPH with LUTS Trial of silodosin 8 mg daily PVR today 79 mL Follow up in 1 month for cystoscopy and TRUS for prostate volume   I have reviewed the above documentation for accuracy and completeness, and I agree with the above.   Abbie Sons, [place] [address] [place], [place] [address] [phone]

## 2023-01-10 DIAGNOSIS — G453 Amaurosis fugax: Secondary | ICD-10-CM | POA: Diagnosis not present

## 2023-01-13 ENCOUNTER — Encounter: Payer: Self-pay | Admitting: Urology

## 2023-01-13 ENCOUNTER — Ambulatory Visit: Payer: PPO | Admitting: Urology

## 2023-01-13 VITALS — BP 163/67 | HR 73 | Ht 70.0 in | Wt 252.0 lb

## 2023-01-13 DIAGNOSIS — N401 Enlarged prostate with lower urinary tract symptoms: Secondary | ICD-10-CM

## 2023-01-13 DIAGNOSIS — R351 Nocturia: Secondary | ICD-10-CM | POA: Diagnosis not present

## 2023-01-13 LAB — URINALYSIS, COMPLETE
Bilirubin, UA: NEGATIVE
Ketones, UA: NEGATIVE
Leukocytes,UA: NEGATIVE
Nitrite, UA: NEGATIVE
Protein,UA: NEGATIVE
RBC, UA: NEGATIVE
Specific Gravity, UA: <1.005 — ABNORMAL LOW (ref 1.005–1.030)
Urobilinogen, Ur: 0.2 mg/dL (ref 0.2–1.0)
pH, UA: 5.5 (ref 5.0–7.5)

## 2023-01-13 LAB — MICROSCOPIC EXAMINATION
Bacteria, UA: NONE SEEN
RBC, Urine: NONE SEEN /hpf (ref 0–2)

## 2023-01-13 NOTE — Progress Notes (Signed)
01/13/23  Chief Complaint  Patient presents with   Cysto     HPI: 77 y.o. male with lower urinary tract symptoms.  Refer to my previous office note 12/26/2022.  No significant improvement in his lower urinary tract symptoms on silodosin.  His most some symptom is urinary hesitancy and decreased force and caliber of stream at nighttime with voiding every 1.5 hours.  He states his voided volumes can be large at times.   Please see previous notes for details.     Blood pressure (!) 163/67, pulse 73, height  (1.778 m), weight 252 lb (114.3 kg). NED. A&Ox3.   No respiratory distress   Abd soft, NT, ND Normal phallus with bilateral descended testicles    Cystoscopy Procedure Note  Patient identification was confirmed, informed consent was obtained, and patient was prepped using Betadine solution.  Lidocaine jelly was administered per urethral meatus.     Pre-Procedure: - Inspection reveals a normal caliber urethral meatus.  Procedure: The flexible cystoscope was introduced without difficulty - No urethral strictures/lesions are present. - Some lateral lobe occlusion near the veru; anterior channel to the mid prostate -Open bladder neck - Bilateral ureteral orifices identified - Bladder mucosa  reveals no ulcers, tumors, or lesions - No bladder stones -Moderate trabeculation  Retroflexion shows no calcifications or median lobe tissue   Post-Procedure: - Patient tolerated the procedure well   Prostate transrectal ultrasound sizing   Informed consent was obtained after discussing risks/benefits of the procedure.  A time out was performed to ensure correct patient identity.   Pre-Procedure: -Transrectal probe was placed without difficulty -Transrectal Ultrasound performed revealing a 32 gm prostate measuring 2.78 x 4.62 x 4.78 cm (length) -No significant hypoechoic or median lobe noted      Assessment/ Plan: Some distal prostate occlusion at veru Nocturia is his  most bothersome symptom; has sleep apnea but is using CPAP Have asked him to keep a 48-hour voiding diary to assess for nocturnal polyuria to include time and volume of each void and time he went to bed and got up   Riki Altes, MD

## 2023-01-13 NOTE — Patient Instructions (Addendum)
Log your urine output for 48 hours. Write down the time you go to bed and the time you get up and the time you use the bathroom and how much output.

## 2023-01-17 DIAGNOSIS — G4733 Obstructive sleep apnea (adult) (pediatric): Secondary | ICD-10-CM | POA: Diagnosis not present

## 2023-02-06 ENCOUNTER — Telehealth: Payer: Self-pay | Admitting: Family Medicine

## 2023-02-06 NOTE — Telephone Encounter (Signed)
Contacted Andrew Lyons to schedule their annual wellness visit. Patient declined to schedule AWV at this time.  I spoke to patient. Patient has a new PCP, Dr. Sampson Goon at [facility]  Thank you,  [facility] Support [facility] Direct dial  [phone]

## 2023-02-06 NOTE — Telephone Encounter (Signed)
Reviewed PCP updated in chart

## 2023-02-09 DIAGNOSIS — H40053 Ocular hypertension, bilateral: Secondary | ICD-10-CM | POA: Diagnosis not present

## 2023-02-09 DIAGNOSIS — H02403 Unspecified ptosis of bilateral eyelids: Secondary | ICD-10-CM | POA: Diagnosis not present

## 2023-02-09 DIAGNOSIS — E119 Type 2 diabetes mellitus without complications: Secondary | ICD-10-CM | POA: Diagnosis not present

## 2023-02-09 DIAGNOSIS — G453 Amaurosis fugax: Secondary | ICD-10-CM | POA: Diagnosis not present

## 2023-02-16 DIAGNOSIS — G4733 Obstructive sleep apnea (adult) (pediatric): Secondary | ICD-10-CM | POA: Diagnosis not present

## 2023-02-17 ENCOUNTER — Other Ambulatory Visit: Payer: Self-pay | Admitting: Urology

## 2023-02-27 DIAGNOSIS — I739 Peripheral vascular disease, unspecified: Secondary | ICD-10-CM | POA: Diagnosis not present

## 2023-02-27 DIAGNOSIS — E782 Mixed hyperlipidemia: Secondary | ICD-10-CM | POA: Diagnosis not present

## 2023-02-27 DIAGNOSIS — E1129 Type 2 diabetes mellitus with other diabetic kidney complication: Secondary | ICD-10-CM | POA: Diagnosis not present

## 2023-02-27 DIAGNOSIS — G4733 Obstructive sleep apnea (adult) (pediatric): Secondary | ICD-10-CM | POA: Diagnosis not present

## 2023-02-27 DIAGNOSIS — R809 Proteinuria, unspecified: Secondary | ICD-10-CM | POA: Diagnosis not present

## 2023-02-27 DIAGNOSIS — R3911 Hesitancy of micturition: Secondary | ICD-10-CM | POA: Diagnosis not present

## 2023-02-27 DIAGNOSIS — I6523 Occlusion and stenosis of bilateral carotid arteries: Secondary | ICD-10-CM | POA: Diagnosis not present

## 2023-02-27 DIAGNOSIS — I25118 Atherosclerotic heart disease of native coronary artery with other forms of angina pectoris: Secondary | ICD-10-CM | POA: Diagnosis not present

## 2023-02-27 DIAGNOSIS — N401 Enlarged prostate with lower urinary tract symptoms: Secondary | ICD-10-CM | POA: Diagnosis not present

## 2023-02-27 DIAGNOSIS — Z6835 Body mass index (BMI) 35.0-35.9, adult: Secondary | ICD-10-CM | POA: Diagnosis not present

## 2023-02-27 DIAGNOSIS — I10 Essential (primary) hypertension: Secondary | ICD-10-CM | POA: Diagnosis not present

## 2023-02-27 DIAGNOSIS — Z952 Presence of prosthetic heart valve: Secondary | ICD-10-CM | POA: Diagnosis not present

## 2023-02-27 DIAGNOSIS — Z0000 Encounter for general adult medical examination without abnormal findings: Secondary | ICD-10-CM | POA: Diagnosis not present

## 2023-03-10 ENCOUNTER — Encounter: Payer: Self-pay | Admitting: Urology

## 2023-03-19 DIAGNOSIS — G4733 Obstructive sleep apnea (adult) (pediatric): Secondary | ICD-10-CM | POA: Diagnosis not present

## 2023-04-04 NOTE — Telephone Encounter (Signed)
Made appt with Sam . For bladder scan

## 2023-04-17 ENCOUNTER — Ambulatory Visit: Payer: PPO | Admitting: Physician Assistant

## 2023-04-17 DIAGNOSIS — N401 Enlarged prostate with lower urinary tract symptoms: Secondary | ICD-10-CM

## 2023-04-17 LAB — BLADDER SCAN AMB NON-IMAGING: Scan Result: 253

## 2023-04-17 MED ORDER — SILODOSIN 8 MG PO CAPS
8.0000 mg | ORAL_CAPSULE | Freq: Every day | ORAL | 11 refills | Status: DC
Start: 2023-04-17 — End: 2023-07-28

## 2023-04-17 NOTE — Progress Notes (Unsigned)
04/17/2023 1:17 PM   Andrew Lyons 09-05-1946 [id number]  CC: Chief Complaint  Patient presents with   Follow-up   HPI: Andrew Lyons is a 77 y.o. male with PMH diabetes on Jardiance, OSA on CPAP, and BPH with LUTS and nocturia s/p UroLift and subsequent PVP with Dr. Evelene Croon in 2020 now on silodosin and tadalafil who presents today for symptom check and bladder scan.  He is accompanied today by his wife, who contributes to HPI.  Today he reports recent urinary hesitancy and nocturia every 60 to 90 minutes.  He is no longer taking silodosin and has been off it for several months.  He is using CPAP nightly and it was last titrated 1 to 1.5 years ago.  He denies dysuria or yeast infections.  His wife reports that they did not notice any difference in his urinary symptoms on alpha blockers.  He underwent cystoscopy with Dr. Lonna Cobb on 01/13/2023 with some distal prostate occlusion.  PVR 252 mL, previously 79 mL.  PMH: 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    Barrett's esophagus with dysplasia    Cataract    COPD (chronic obstructive pulmonary disease) (HCC)    Coronary artery disease    Diabetes mellitus without complication (HCC)    GERD (gastroesophageal reflux disease)    Hepatitis    Hx of dysplastic nevus 10/19/2006   L upper back, medial to mid scapula, slight to moderate atypia   Hx of dysplastic nevus 03/18/2008   R upper back 4.0cm lat to spine, moderate to marked atypia, excised 04/15/2008   Hx of dysplastic nevus 09/22/2008   R mid back 5.0cm lat to spine, moderate atypia   Hx of dysplastic nevus 04/06/2009   L mid to low back 11.0cm lat to spine, mild atypia   Hyperlipidemia    Hypertension    Myocardial infarction (HCC)    Seizures (HCC)    Sleep apnea     Surgical History: Past Surgical History:  Procedure Laterality Date   ABDOMINAL AORTOGRAM W/LOWER EXTREMITY N/A 06/20/2018   Procedure: ABDOMINAL  AORTOGRAM W/LOWER EXTREMITY;  Surgeon: Iran Ouch, MD;  [facility]: [facility];  Service: Cardiovascular;  Laterality: N/A;   AMPUTATION Right 10/27/2018   Procedure: AMPUTATION BELOW KNEE;  Surgeon: Signa Kell, MD;  Location: [facility];  Service: Orthopedics;  Laterality: Right;   CARDIAC CATHETERIZATION     with Angioplasty   cardiac stents     CARDIAC VALVE REPLACEMENT     Aortic Valve Replacement   COLONOSCOPY N/A 03/26/2020   Procedure: COLONOSCOPY;  Surgeon: Sung Amabile, DO;  Location: [facility];  Service: General;  Laterality: N/A;   COLONOSCOPY WITH PROPOFOL N/A 03/18/2015   Procedure: COLONOSCOPY WITH PROPOFOL;  Surgeon: Scot Jun, MD;  Location[facility];  Service: Endoscopy;  Laterality: N/A;   ESOPHAGOGASTRODUODENOSCOPY N/A 03/18/2015   Procedure: ESOPHAGOGASTRODUODENOSCOPY (EGD);  Surgeon: Scot Jun, MD;  Location: [facility];  Service: Endoscopy;  Laterality: N/A;   FOOT AMPUTATION     GREEN LIGHT LASER TURP (TRANSURETHRAL RESECTION OF PROSTATE N/A 10/16/2018   Procedure: GREEN LIGHT LASER TURP (TRANSURETHRAL RESECTION OF PROSTATE;  Surgeon: Orson Ape, MD;  Location: [facility];  Service: Urology;  Laterality: N/A;   HARDWARE REMOVAL Right 10/27/2018   Procedure: HARDWARE REMOVAL;  Surgeon: Signa Kell, MD;  Location: [facility];  Service: Orthopedics;  Laterality: Right;   HEMORRHOID SURGERY N/A 03/26/2020   Procedure:  HEMORRHOIDECTOMY;  Surgeon: Sung Amabile, DO;  Location: [facility];  Service: General;  Laterality: N/A;   LOWER EXTREMITY ANGIOGRAPHY Right 10/25/2018   Procedure: Lower Extremity Angiography;  Surgeon: Annice Needy, MD;  Location: [facility];  Service: Cardiovascular;  Laterality: Right;   PERIPHERAL VASCULAR INTERVENTION Right 06/20/2018   Procedure: PERIPHERAL VASCULAR INTERVENTION;  Surgeon: Iran Ouch, MD;  [facility]: [facility];  Service: Cardiovascular;  Laterality: Right;   THORACOTOMY Right     TONSILLECTOMY      Home Medications:  Allergies as of 04/17/2023       Reactions   Iodinated Contrast Media Rash   had a red chest -unusre of what kind of dye it was   Zyban [bupropion] Rash   Zocor [simvastatin] Other (See Comments)   Reaction: hepatitis    Gadolinium Derivatives Rash        Medication List        Accurate as of April 17, 2023  1:17 PM. If you have any questions, ask your nurse or doctor.          amLODipine-benazepril 5-40 MG capsule Commonly known as: LOTREL Take 1 capsule by mouth daily.   B-D UF III MINI PEN NEEDLES 31G X 5 MM Misc Generic drug: Insulin Pen Needle USE WITH PEN DAILY   blood glucose meter kit and supplies Dispense based on patient and insurance preference. Use up to Two times daily as directed. (FOR ICD-10 E11.9, E11.59).   cholecalciferol 25 MCG (1000 UNIT) tablet Commonly known as: VITAMIN D3 Take 1,000 Units by mouth daily.   clopidogrel 75 MG tablet Commonly known as: PLAVIX TAKE 1 TABLET BY MOUTH ONCE A DAY   Coenzyme Q10 100 MG Tabs Take 100 mg by mouth daily.   cyanocobalamin 1000 MCG tablet Commonly known as: VITAMIN B12 Take 1,000 mcg by mouth daily.   fluticasone 50 MCG/ACT nasal spray Commonly known as: FLONASE USE 2 SPRAYS INTO EACH NOSTRIL EVERY DAY   Jardiance 25 MG Tabs tablet Generic drug: empagliflozin TAKE 1 TABLET BY MOUTH ONCE DAILY   ketoconazole 2 % cream Commonly known as: NIZORAL Apply 1 Application topically 2 (two) times daily.   ketoconazole 2 % cream Commonly known as: NIZORAL Apply to entire feet at bedtime   metFORMIN 500 MG tablet Commonly known as: GLUCOPHAGE TAKE 1 TABLET BY MOUTH TWICE A DAY WITH A MEAL   metoprolol succinate 100 MG 24 hr tablet Commonly known as: TOPROL-XL TAKE 1 TABLET BY MOUTH ONCE A DAY WITH OR IMMEDIATELY FOLLOWING A MEAL   montelukast 10 MG tablet Commonly known as: SINGULAIR Take 1 tablet (10 mg total) by mouth daily.   omeprazole 40 MG  capsule Commonly known as: PRILOSEC Take 1 capsule (40 mg total) by mouth daily.   ONE TOUCH ULTRA SYSTEM KIT w/Device Kit To use daily to check sugar. DX E11.9-needs one touch ultra meter   OneTouch Ultra test strip Generic drug: glucose blood USE 1 STRIP TO CHECK GLUCOSE TWICE DAILY AS NEEDED   onetouch ultrasoft lancets USE 1  TO CHECK GLUCOSE THREE TIMES DAILY   rosuvastatin 20 MG tablet Commonly known as: CRESTOR TAKE 1 TABLET BY MOUTH ONCE A DAY   sildenafil 100 MG tablet Commonly known as: VIAGRA Take 50 mg by mouth daily as needed for erectile dysfunction.   silodosin 8 MG Caps capsule Commonly known as: RAPAFLO Take 1 capsule (8 mg total) by mouth daily with breakfast.   tadalafil 5 MG  tablet Commonly known as: CIALIS TAKE ONE TABLET BY MOUTH ONCE DAILY AS NEEDED FOR ERECTILE DYSFUNCTION   tirzepatide 7.5 MG/0.5ML Pen Commonly known as: MOUNJARO Inject 7.5 mg into the skin once a week.        Allergies:  Allergies  Allergen Reactions   Iodinated Contrast Media Rash    had a red chest -unusre of what kind of dye it was   Zyban [Bupropion] Rash   Zocor [Simvastatin] Other (See Comments)    Reaction: hepatitis    Gadolinium Derivatives Rash    Family History: Family History  Problem Relation Age of Onset   Obesity Son    Diabetes Brother    Hypertension Brother    Heart attack Mother    Heart attack Father     Social History:   reports that he quit smoking about 21 years ago. His smoking use included cigarettes. He has quit using smokeless tobacco. He reports that he does not currently use alcohol. He reports that he does not use drugs.  Physical Exam: There were no vitals taken for this visit.  Constitutional:  Alert and oriented, no acute distress, nontoxic appearing HEENT: [place], AT Cardiovascular: No clubbing, cyanosis, or edema Respiratory: Normal respiratory effort, no increased work of breathing Skin: No rashes, bruises or suspicious  lesions Neurologic: Grossly intact, no focal deficits, moving all 4 extremities Psychiatric: Normal mood and affect  Assessment & Plan:   1. Benign localized prostatic hyperplasia with lower urinary tract symptoms (LUTS) New incomplete bladder emptying and hesitancy off silodosin.  With him being on Jardiance, anticipate glucosuria which would increase his risk for UTI in the setting of incomplete emptying.  I encouraged him to resume silodosin to maximize emptying.  I also asked him to leave a urine sample today and we will contact him with his results.  At this point, I recommend sending him for urodynamics in [place] and he agreed.  Will have him follow-up with Dr. Lonna Cobb for results. - BLADDER SCAN AMB NON-IMAGING - Urinalysis, Complete - Ambulatory referral to Urology - silodosin (RAPAFLO) 8 MG CAPS capsule; Take 1 capsule (8 mg total) by mouth daily with breakfast.  Dispense: 30 capsule; Refill: 11   Return in about 8 weeks (around 06/12/2023) for UDS results with Dr. Lonna Cobb.  Carman Ching, PA-C  [facility] [place] [address], [address] [place], [place] [address] [phone]

## 2023-04-18 DIAGNOSIS — G4733 Obstructive sleep apnea (adult) (pediatric): Secondary | ICD-10-CM | POA: Diagnosis not present

## 2023-04-18 LAB — MICROSCOPIC EXAMINATION
Bacteria, UA: NONE SEEN
Epithelial Cells (non renal): NONE SEEN /hpf (ref 0–10)
RBC, Urine: NONE SEEN /hpf (ref 0–2)

## 2023-04-18 LAB — URINALYSIS, COMPLETE
Bilirubin, UA: NEGATIVE
Ketones, UA: NEGATIVE
Leukocytes,UA: NEGATIVE
Nitrite, UA: NEGATIVE
Protein,UA: NEGATIVE
RBC, UA: NEGATIVE
Specific Gravity, UA: 1.01 (ref 1.005–1.030)
Urobilinogen, Ur: 0.2 mg/dL (ref 0.2–1.0)
pH, UA: 5.5 (ref 5.0–7.5)

## 2023-05-02 ENCOUNTER — Other Ambulatory Visit: Payer: Self-pay

## 2023-05-02 MED ORDER — MOUNJARO 7.5 MG/0.5ML ~~LOC~~ SOAJ
7.5000 mg | SUBCUTANEOUS | 1 refills | Status: AC
  Filled 2023-05-02: qty 2, 28d supply, fill #0
  Filled 2023-05-27: qty 2, 28d supply, fill #1

## 2023-05-03 ENCOUNTER — Other Ambulatory Visit: Payer: Self-pay

## 2023-05-15 DIAGNOSIS — R350 Frequency of micturition: Secondary | ICD-10-CM | POA: Diagnosis not present

## 2023-05-19 DIAGNOSIS — G4733 Obstructive sleep apnea (adult) (pediatric): Secondary | ICD-10-CM | POA: Diagnosis not present

## 2023-05-29 ENCOUNTER — Other Ambulatory Visit: Payer: Self-pay

## 2023-06-12 ENCOUNTER — Ambulatory Visit: Payer: PPO | Admitting: Urology

## 2023-06-12 ENCOUNTER — Encounter: Payer: Self-pay | Admitting: Urology

## 2023-06-12 VITALS — BP 147/76 | HR 71 | Ht 70.0 in | Wt 250.0 lb

## 2023-06-12 DIAGNOSIS — N401 Enlarged prostate with lower urinary tract symptoms: Secondary | ICD-10-CM

## 2023-06-12 DIAGNOSIS — R399 Unspecified symptoms and signs involving the genitourinary system: Secondary | ICD-10-CM | POA: Diagnosis not present

## 2023-06-12 DIAGNOSIS — R339 Retention of urine, unspecified: Secondary | ICD-10-CM | POA: Diagnosis not present

## 2023-06-12 NOTE — Progress Notes (Signed)
I, Andrew Lyons, acting as a scribe for Andrew Altes, MD., have documented all relevant documentation on the behalf of Andrew Altes, MD, as directed by Andrew Altes, MD while in the presence of Andrew Altes, MD.  06/12/2023 1:57 PM   Andrew Lyons 01-20-1946 [id number]  Referring provider: Bosie Clos, MD [address] [place],  [place] [address]  Chief Complaint  Patient presents with   Results   Urologic history: 1. BPH with LUTS Silodosin 8 mg  HPI: Andrew Lyons is a 77 y.o. male presents for follow-up with review of a urodynamic study.   He has severe lower urinary tract symptoms with IPSS 35/35 and PVR 253 mL.  Sleep apnea on CPAP.  Prior green light PVP Dr. Evelene Croon 2020.  Urodynamic study performed at [facility] 05/15/2023 showed ablative capacity of 523 mL, first sensation at 228 mL and a normal desire at 328 mL with strong desire at 457 mL; no instability was noted; he generated a voluntary contraction in void 188 mL with a peak flow rate of 2 mL per second; his detrusive pressure at peak flow was only 9 cm of water; PVR 335 mL.    PMH: 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    Barrett's esophagus with dysplasia    Cataract    COPD (chronic obstructive pulmonary disease) (HCC)    Coronary artery disease    Diabetes mellitus without complication (HCC)    GERD (gastroesophageal reflux disease)    Hepatitis    Hx of dysplastic nevus 10/19/2006   L upper back, medial to mid scapula, slight to moderate atypia   Hx of dysplastic nevus 03/18/2008   R upper back 4.0cm lat to spine, moderate to marked atypia, excised 04/15/2008   Hx of dysplastic nevus 09/22/2008   R mid back 5.0cm lat to spine, moderate atypia   Hx of dysplastic nevus 04/06/2009   L mid to low back 11.0cm lat to spine, mild atypia   Hyperlipidemia    Hypertension    Myocardial infarction (HCC)    Seizures (HCC)     Sleep apnea     Surgical History: Past Surgical History:  Procedure Laterality Date   ABDOMINAL AORTOGRAM W/LOWER EXTREMITY N/A 06/20/2018   Procedure: ABDOMINAL AORTOGRAM W/LOWER EXTREMITY;  Surgeon: Iran Ouch, MD;  [facility];  Service: Cardiovascular;  Laterality: N/A;   AMPUTATION Right 10/27/2018   Procedure: AMPUTATION BELOW KNEE;  Surgeon: Signa Kell, MD;  Location: [facility] ORS;  Service: Orthopedics;  Laterality: Right;   CARDIAC CATHETERIZATION     with Angioplasty   cardiac stents     CARDIAC VALVE REPLACEMENT     Aortic Valve Replacement   COLONOSCOPY N/A 03/26/2020   Procedure: COLONOSCOPY;  Surgeon: Sung Amabile, DO;  Location: [facility] ORS;  Service: General;  Laterality: N/A;   COLONOSCOPY WITH PROPOFOL N/A 03/18/2015   Procedure: COLONOSCOPY WITH PROPOFOL;  Surgeon: Scot Jun, MD;  Location: [facility] ENDOSCOPY;  Service: Endoscopy;  Laterality: N/A;   ESOPHAGOGASTRODUODENOSCOPY N/A 03/18/2015   Procedure: ESOPHAGOGASTRODUODENOSCOPY (EGD);  Surgeon: Scot Jun, MD;  Location: [facility] ENDOSCOPY;  Service: Endoscopy;  Laterality: N/A;   FOOT AMPUTATION     GREEN LIGHT LASER TURP (TRANSURETHRAL RESECTION OF PROSTATE N/A 10/16/2018   Procedure: GREEN LIGHT LASER TURP (TRANSURETHRAL RESECTION OF PROSTATE;  Surgeon: Orson Ape, MD;  Location: [facility] ORS;  Service: Urology;  Laterality: N/A;   HARDWARE REMOVAL Right 10/27/2018   Procedure: HARDWARE REMOVAL;  Surgeon: Signa Kell, MD;  Location: [facility] ORS;  Service: Orthopedics;  Laterality: Right;   HEMORRHOID SURGERY N/A 03/26/2020   Procedure: HEMORRHOIDECTOMY;  Surgeon: Sung Amabile, DO;  Location: [facility] ORS;  Service: General;  Laterality: N/A;   LOWER EXTREMITY ANGIOGRAPHY Right 10/25/2018   Procedure: Lower Extremity Angiography;  Surgeon: Annice Needy, MD;  Location: [facility]VASIVE CV LAB;  Service: Cardiovascular;  Laterality: Right;   PERIPHERAL VASCULAR INTERVENTION Right 06/20/2018   Procedure:  PERIPHERAL VASCULAR INTERVENTION;  Surgeon: Iran Ouch, MD;  Location: [facility];  Service: Cardiovascular;  Laterality: Right;   THORACOTOMY Right    TONSILLECTOMY      Home Medications:  Allergies as of 06/12/2023       Reactions   Iodinated Contrast Media Rash   had a red chest -unusre of what kind of dye it was   Zyban [bupropion] Rash   Zocor [simvastatin] Other (See Comments)   Reaction: hepatitis    Gadolinium Derivatives Rash        Medication List        Accurate as of June 12, 2023  1:57 PM. If you have any questions, ask your nurse or doctor.          amLODipine-benazepril 5-40 MG capsule Commonly known as: LOTREL Take 1 capsule by mouth daily.   B-D UF III MINI PEN NEEDLES 31G X 5 MM Misc Generic drug: Insulin Pen Needle USE WITH PEN DAILY   blood glucose meter kit and supplies Dispense based on patient and insurance preference. Use up to Two times daily as directed. (FOR ICD-10 E11.9, E11.59).   cholecalciferol 25 MCG (1000 UNIT) tablet Commonly known as: VITAMIN D3 Take 1,000 Units by mouth daily.   clopidogrel 75 MG tablet Commonly known as: PLAVIX TAKE 1 TABLET BY MOUTH ONCE A DAY   Coenzyme Q10 100 MG Tabs Take 100 mg by mouth daily.   cyanocobalamin 1000 MCG tablet Commonly known as: VITAMIN B12 Take 1,000 mcg by mouth daily.   fluticasone 50 MCG/ACT nasal spray Commonly known as: FLONASE USE 2 SPRAYS INTO EACH NOSTRIL EVERY DAY   Jardiance 25 MG Tabs tablet Generic drug: empagliflozin TAKE 1 TABLET BY MOUTH ONCE DAILY   ketoconazole 2 % cream Commonly known as: NIZORAL Apply 1 Application topically 2 (two) times daily.   ketoconazole 2 % cream Commonly known as: NIZORAL Apply to entire feet at bedtime   metFORMIN 500 MG tablet Commonly known as: GLUCOPHAGE TAKE 1 TABLET BY MOUTH TWICE A DAY WITH A MEAL   metoprolol succinate 100 MG 24 hr tablet Commonly known as: TOPROL-XL TAKE 1 TABLET BY MOUTH ONCE A  DAY WITH OR IMMEDIATELY FOLLOWING A MEAL   montelukast 10 MG tablet Commonly known as: SINGULAIR Take 1 tablet (10 mg total) by mouth daily.   omeprazole 40 MG capsule Commonly known as: PRILOSEC Take 1 capsule (40 mg total) by mouth daily.   ONE TOUCH ULTRA SYSTEM KIT w/Device Kit To use daily to check sugar. DX E11.9-needs one touch ultra meter   OneTouch Ultra test strip Generic drug: glucose blood USE 1 STRIP TO CHECK GLUCOSE TWICE DAILY AS NEEDED   onetouch ultrasoft lancets USE 1  TO CHECK GLUCOSE THREE TIMES DAILY   rosuvastatin 20 MG tablet Commonly known as: CRESTOR TAKE 1 TABLET BY MOUTH ONCE A DAY   sildenafil 100 MG tablet Commonly known as: VIAGRA Take  50 mg by mouth daily as needed for erectile dysfunction.   silodosin 8 MG Caps capsule Commonly known as: RAPAFLO Take 1 capsule (8 mg total) by mouth daily with breakfast.   tadalafil 5 MG tablet Commonly known as: CIALIS TAKE ONE TABLET BY MOUTH ONCE DAILY AS NEEDED FOR ERECTILE DYSFUNCTION   tirzepatide 7.5 MG/0.5ML Pen Commonly known as: MOUNJARO Inject 7.5 mg into the skin once a week.   Mounjaro 7.5 MG/0.5ML Pen Generic drug: tirzepatide Inject 7.5 mg into the skin once a week.        Allergies:  Allergies  Allergen Reactions   Iodinated Contrast Media Rash    had a red chest -unusre of what kind of dye it was   Zyban [Bupropion] Rash   Zocor [Simvastatin] Other (See Comments)    Reaction: hepatitis    Gadolinium Derivatives Rash    Family History: Family History  Problem Relation Age of Onset   Obesity Son    Diabetes Brother    Hypertension Brother    Heart attack Mother    Heart attack Father     Social History:  reports that he quit smoking about 21 years ago. His smoking use included cigarettes. He has quit using smokeless tobacco. He reports that he does not currently use alcohol. He reports that he does not use drugs.   Physical Exam: BP (!) 147/76   Pulse 71   Ht 5'  10" (1.778 m)   Wt 250 lb (113.4 kg)   BMI 35.87 kg/m   Constitutional:  Alert and oriented, No acute distress. HEENT: [place] AT, moist mucus membranes.  Trachea midline, no masses. Cardiovascular: No clubbing, cyanosis, or edema. Respiratory: Normal respiratory effort, no increased work of breathing. GI: Abdomen is soft, nontender, nondistended, no abdominal masses Skin: No rashes, bruises or suspicious lesions. Neurologic: Grossly intact, no focal deficits, moving all 4 extremities. Psychiatric: Normal mood and affect.   Assessment & Plan:    1. Lower urinary tract symptoms Severe lower urinary tract symptoms with incomplete emptying.  Although he was able to avoid max detrusor pressure at peak flow was low, indicative of a hypotonic bladder.  Recent cystoscopy did show a decrease distal prostatic urethra adenoma with a volume measured at 32 cc.  Dr. Richardo Hanks has had success with HOLIP in patients with a hypotonic bladder. We'll ask him to review chart, and if he feels a patient may benefit from this procedure, we'll schedule an appointment.  [facility] [address], [address] [place], [place] [address] [phone]

## 2023-06-13 ENCOUNTER — Encounter: Payer: Self-pay | Admitting: Urology

## 2023-06-19 DIAGNOSIS — G4733 Obstructive sleep apnea (adult) (pediatric): Secondary | ICD-10-CM | POA: Diagnosis not present

## 2023-07-05 ENCOUNTER — Other Ambulatory Visit: Payer: Self-pay

## 2023-07-05 ENCOUNTER — Encounter: Payer: Self-pay | Admitting: Urology

## 2023-07-05 ENCOUNTER — Ambulatory Visit: Payer: PPO | Admitting: Urology

## 2023-07-05 VITALS — BP 125/71 | HR 76 | Ht 70.0 in | Wt 247.0 lb

## 2023-07-05 DIAGNOSIS — N401 Enlarged prostate with lower urinary tract symptoms: Secondary | ICD-10-CM

## 2023-07-05 DIAGNOSIS — N138 Other obstructive and reflux uropathy: Secondary | ICD-10-CM

## 2023-07-05 NOTE — Progress Notes (Signed)
07/05/2023 10:19 AM   Andrew Lyons May 21, 1946 [id number]  Reason for visit: Discuss HoLEP  HPI: 77 year old male referred from Dr. Lonna Cobb for consideration of HOLEP.  He was also previously followed by Dr. Sheppard Penton and has a history of a UroLift as well as a greenlight laser PVP in 2020, neither of these helped his urinary symptoms, and it sounds like he had a fair amount of dysuria and irritative symptoms after PVP.  His primary complaint is nocturia every 1 to 1-1/2 hours overnight as well as weak stream, difficulty initiating stream, and frequency during the day.  He has undergone extensive workup with Dr. Lonna Cobb.  Cystoscopy showed distal prostatic obstructive appearing adenoma and volume measured 32g, he also had urodynamics in [place] that showed bladder capacity , first sensation , no instability, voluntary contraction voiding with a peak flow rate of 61ml/s with detrusor pressure of only 9 cm of water consistent with atonic bladder, and PVR was elevated at .  We discussed options including outlet procedure with HOLEP that tends to be helpful even in patients with atonic bladder, or treatments like chronic catheterization or CIC.  He is interested in an outlet procedure, and understands his complex history and component of atonic bladder.  We discussed the risks and benefits of HoLEP at length.  The procedure requires general anesthesia and takes 1 to 2 hours, and a holmium laser is used to enucleate the prostate and push this tissue into the bladder.  A morcellator is then used to remove this tissue, which is sent for pathology.  The vast majority(>95%) of patients are able to discharge the same day with a catheter in place for 2 to 3 days, and will follow-up in clinic for a voiding trial.  We specifically discussed the risks of bleeding, infection, retrograde ejaculation, temporary urgency and urge incontinence, very low risk of long-term incontinence, urethral  stricture/bladder neck contracture, pathologic evaluation of prostate tissue and possible detection of prostate cancer or other malignancy, and possible need for additional procedures.  Schedule HOLEP  Sondra Come, MD  [facility] [address] [place], [place] [address] [phone]

## 2023-07-05 NOTE — Patient Instructions (Signed)

## 2023-07-05 NOTE — Progress Notes (Unsigned)
Surgical Physician Order Form [facility]  Dr. Legrand Rams, MD  * Scheduling expectation : Next Available  *Length of Case: 1.5 hours  *Clearance needed: yes, cardiology  *Anticoagulation Instructions: Hold all anticoagulants  *Aspirin Instructions: Hold Aspirin and Plavix  *Post-op visit Date/Instructions:  1-3 day cath removal  *Diagnosis: BPH w/urinary obstruction  *Procedure:  HOLEP ([address])   Additional orders: N/A  -Admit type: OUTpatient  -Anesthesia: General  -VTE Prophylaxis Standing Order SCD's       Other:   -Standing Lab Orders Per Anesthesia    Lab other: UA&Urine Culture  -Standing Test orders EKG/Chest x-ray per Anesthesia       Test other:   - Medications:  Ancef 2gm IV  -Other orders:  N/A

## 2023-07-06 ENCOUNTER — Telehealth: Payer: Self-pay

## 2023-07-06 DIAGNOSIS — I10 Essential (primary) hypertension: Secondary | ICD-10-CM | POA: Diagnosis not present

## 2023-07-06 DIAGNOSIS — E66812 Obesity, class 2: Secondary | ICD-10-CM | POA: Diagnosis not present

## 2023-07-06 DIAGNOSIS — I739 Peripheral vascular disease, unspecified: Secondary | ICD-10-CM | POA: Diagnosis not present

## 2023-07-06 DIAGNOSIS — R809 Proteinuria, unspecified: Secondary | ICD-10-CM | POA: Diagnosis not present

## 2023-07-06 DIAGNOSIS — Z6835 Body mass index (BMI) 35.0-35.9, adult: Secondary | ICD-10-CM | POA: Diagnosis not present

## 2023-07-06 DIAGNOSIS — I6523 Occlusion and stenosis of bilateral carotid arteries: Secondary | ICD-10-CM | POA: Diagnosis not present

## 2023-07-06 DIAGNOSIS — Z952 Presence of prosthetic heart valve: Secondary | ICD-10-CM | POA: Diagnosis not present

## 2023-07-06 DIAGNOSIS — I25118 Atherosclerotic heart disease of native coronary artery with other forms of angina pectoris: Secondary | ICD-10-CM | POA: Diagnosis not present

## 2023-07-06 DIAGNOSIS — R3911 Hesitancy of micturition: Secondary | ICD-10-CM | POA: Diagnosis not present

## 2023-07-06 DIAGNOSIS — E1129 Type 2 diabetes mellitus with other diabetic kidney complication: Secondary | ICD-10-CM | POA: Diagnosis not present

## 2023-07-06 DIAGNOSIS — N401 Enlarged prostate with lower urinary tract symptoms: Secondary | ICD-10-CM | POA: Diagnosis not present

## 2023-07-06 DIAGNOSIS — E782 Mixed hyperlipidemia: Secondary | ICD-10-CM | POA: Diagnosis not present

## 2023-07-06 NOTE — Progress Notes (Signed)
  Phone Number: [phone] for Surgical Coordinator Fax Number: [phone]  REQUEST FOR SURGICAL CLEARANCE       Date: Date: 07/28/2023  Faxed to: [facility]  Surgeon: Dr. Legrand Rams, MD     Date of Surgery: 07/28/2023  Operation: Holmium Laser Enucleation of the Prostate   Anesthesia Type: General   Diagnosis: Benign Prostatic Hyperplasia with Urinary Obstruction  Patient Requires:   Cardiac / Vascular Clearance : Yes  Reason: Would like for patient to hold Plavix for 5 days prior to surgery    Risk Assessment:    Low   []       Moderate   []     High   []           This patient is optimized for surgery  YES []       NO   []    I recommend further assessment/workup prior to surgery. YES []      NO  []   Appointment scheduled for: _______________________   Further recommendations: ____________________________________     Physician Signature:__________________________________   Printed Name: ________________________________________   Date: _________________

## 2023-07-06 NOTE — Telephone Encounter (Signed)
Per Dr. Richardo Hanks, Patient is to be scheduled for Holmium Laser Enucleation of the Prostate   Mr. Andrew Lyons was contacted and possible surgical dates were discussed, Friday November 1st, 2024 was agreed upon for surgery.   Patient was instructed that Dr. Richardo Hanks will require them to provide a pre-op UA & CX prior to surgery. This was ordered and scheduled drop off appointment was made for 07/12/2023.    Patient was directed to call [phone] between 1-3pm the day before surgery to find out surgical arrival time.  Instructions were given not to eat or drink from midnight on the night before surgery and have a driver for the day of surgery. On the surgery day patient was instructed to enter through the Medical Mall entrance of [facility] report the Same Day Surgery desk.   Pre-Admit Testing will be in contact via phone to set up an interview with the anesthesia team to review your history and medications prior to surgery.   Reminder of this information was sent via MyChart to the patient.

## 2023-07-06 NOTE — Progress Notes (Signed)
   [facility] Surgical Posting Form  Surgery Date: Date: 07/28/2023  Surgeon: Dr. Legrand Rams, MD  Inpt ( No  )   Outpt (Yes)   Obs ( No  )   Diagnosis: N40.1, N13.8 Benign Prostatic Hyperplasia with Urinary Obstruction   -CPT: 16109  Surgery: Holmium Laser Enucleation of the Prostate  Stop Anticoagulations: Yes and will also need to hold ASA  Cardiac/Medical/Pulmonary Clearance needed: yes  Clearance needed from Dr: [facility]  Clearance request sent on: Date: 07/06/23  *Orders entered into EPIC  Date: 07/06/23   *Case booked in EPIC  Date: 07/06/23  *Notified pt of Surgery: Date: 07/06/23  PRE-OP UA & CX: yes, will obtain in clinic on 07/12/2023  *Placed into Prior Authorization Work Angela Nevin Date: 07/06/23  Assistant/laser/rep:No

## 2023-07-12 ENCOUNTER — Other Ambulatory Visit: Payer: PPO

## 2023-07-12 DIAGNOSIS — I25118 Atherosclerotic heart disease of native coronary artery with other forms of angina pectoris: Secondary | ICD-10-CM | POA: Diagnosis not present

## 2023-07-12 DIAGNOSIS — I10 Essential (primary) hypertension: Secondary | ICD-10-CM | POA: Diagnosis not present

## 2023-07-12 DIAGNOSIS — E782 Mixed hyperlipidemia: Secondary | ICD-10-CM | POA: Diagnosis not present

## 2023-07-12 DIAGNOSIS — Z952 Presence of prosthetic heart valve: Secondary | ICD-10-CM | POA: Diagnosis not present

## 2023-07-12 DIAGNOSIS — G4733 Obstructive sleep apnea (adult) (pediatric): Secondary | ICD-10-CM | POA: Diagnosis not present

## 2023-07-12 DIAGNOSIS — I6523 Occlusion and stenosis of bilateral carotid arteries: Secondary | ICD-10-CM | POA: Diagnosis not present

## 2023-07-14 ENCOUNTER — Telehealth: Payer: Self-pay

## 2023-07-14 NOTE — Telephone Encounter (Signed)
Spoke with pt. Wife. Advised to stop Plavix 5 days prior to surgery and to hold Mounjaro 1 week prior to surgery. Patient wife verbalized understanding.

## 2023-07-19 DIAGNOSIS — G4733 Obstructive sleep apnea (adult) (pediatric): Secondary | ICD-10-CM | POA: Diagnosis not present

## 2023-07-20 ENCOUNTER — Other Ambulatory Visit: Payer: Self-pay

## 2023-07-20 ENCOUNTER — Other Ambulatory Visit: Payer: PPO

## 2023-07-20 ENCOUNTER — Encounter
Admission: RE | Admit: 2023-07-20 | Discharge: 2023-07-20 | Disposition: A | Discharge status: Home / Self Care | Payer: PPO | Source: Ambulatory Visit | Attending: Urology | Admitting: Urology

## 2023-07-20 DIAGNOSIS — I10 Essential (primary) hypertension: Secondary | ICD-10-CM

## 2023-07-20 DIAGNOSIS — E119 Type 2 diabetes mellitus without complications: Secondary | ICD-10-CM

## 2023-07-20 HISTORY — DX: Acquired absence of right leg below knee: Z89.511

## 2023-07-20 HISTORY — DX: Unspecified osteoarthritis, unspecified site: M19.90

## 2023-07-20 HISTORY — DX: Overactive bladder: N32.81

## 2023-07-20 HISTORY — DX: Peripheral vascular disease, unspecified: I73.9

## 2023-07-20 HISTORY — DX: Presence of prosthetic heart valve: Z95.2

## 2023-07-20 NOTE — Patient Instructions (Addendum)
Your procedure is scheduled on: 07/28/23 - Tuesday Report to the Registration Desk on the 1st floor of the Medical Mall. To find out your arrival time, please call [phone] between 1PM - 3PM on: 07/27/23 - Monday If your arrival time is 6:00 am, do not arrive before that time as the Medical Mall entrance doors do not open until 6:00 am.  REMEMBER: Instructions that are not followed completely may result in serious medical risk, up to and including death; or upon the discretion of your surgeon and anesthesiologist your surgery may need to be rescheduled.  Do not eat food or drink any liquids after midnight the night before surgery.  No gum chewing or hard candies.   HOLD One week prior to surgery: Stop Anti-inflammatories (NSAIDS) such as Advil, Aleve, Ibuprofen, Motrin, Naproxen, Naprosyn and Aspirin based products such as Excedrin, Goody's Powder, BC Powder. You may take Tylenol if needed for pain up until the day of surgery.  Stop ANY OVER THE COUNTER supplements until after surgery : VITAMIN D , Coenzyme Q10 , BLACK ELDERBERRY , BLACK ELDERBERRY , Vitamin C.  HOLD tirzepatide [facility])  beginning 07/20/23.  HOLD clopidogrel (PLAVIX) beginning 07/23/23.  HOLD amLODipine-benazepril (LOTREL) on the day of surgery.  HOLD JARDIANCE beginning 07/25/23.  HOLD metFORMIN (GLUCOPHAGE) beginning 07/26/23.  HOLD tadalafil (CIALIS) beginning 07/26/23.   ON THE DAY OF SURGERY ONLY TAKE THESE MEDICATIONS WITH SIPS OF WATER:  metoprolol succinate  omeprazole (PRILOSEC)  silodosin (RAPAFLO)  fluticasone (FLONASE)  montelukast (SINGULAIR)  rosuvastatin (CRESTOR)    No Alcohol for 24 hours before or after surgery.  No Smoking including e-cigarettes for 24 hours before surgery.  No chewable tobacco products for at least 6 hours before surgery.  No nicotine patches on the day of surgery.  Do not use any "recreational" drugs for at least a week (preferably 2 weeks) before your  surgery.  Please be advised that the combination of cocaine and anesthesia may have negative outcomes, up to and including death. If you test positive for cocaine, your surgery will be cancelled.  On the morning of surgery brush your teeth with toothpaste and water, you may rinse your mouth with mouthwash if you wish. Do not swallow any toothpaste or mouthwash.  Do not wear jewelry, make-up, hairpins, clips or nail polish.  For welded (permanent) jewelry: bracelets, anklets, waist bands, etc.  Please have this removed prior to surgery.  If it is not removed, there is a chance that hospital personnel will need to cut it off on the day of surgery.  Do not wear lotions, powders, or perfumes.   Do not shave body hair from the neck down 48 hours before surgery.  Contact lenses, hearing aids and dentures may not be worn into surgery.  Do not bring valuables to the hospital. [facility] is not responsible for any missing/lost belongings or valuables.   Bring your C-PAP to the hospital in case you may have to spend the night.   Notify your doctor if there is any change in your medical condition (cold, fever, infection).  Wear comfortable clothing (specific to your surgery type) to the hospital.  After surgery, you can help prevent lung complications by doing breathing exercises.  Take deep breaths and cough every 1-2 hours. Your doctor may order a device called an Incentive Spirometer to help you take deep breaths. When coughing or sneezing, hold a pillow firmly against your incision with both hands. This is called "splinting." Doing this helps protect your  incision. It also decreases belly discomfort.  If you are being admitted to the hospital overnight, leave your suitcase in the car. After surgery it may be brought to your room.  In case of increased patient census, it may be necessary for you, the patient, to continue your postoperative care in the Same Day Surgery department.  If you  are being discharged the day of surgery, you will not be allowed to drive home. You will need a responsible individual to drive you home and stay with you for 24 hours after surgery.   If you are taking public transportation, you will need to have a responsible individual with you.  Please call the Pre-admissions Testing Dept. at [phone] if you have any questions about these instructions.  Surgery Visitation Policy:  Patients having surgery or a procedure may have two visitors.  Children under the age of 4 must have an adult with them who is not the patient.  Inpatient Visitation:    Visiting hours are 7 a.m. to 8 p.m. Up to four visitors are allowed at one time in a patient room. The visitors may rotate out with other people during the day.  One visitor age 10 or older may stay with the patient overnight and must be in the room by 8 p.m.

## 2023-07-21 ENCOUNTER — Other Ambulatory Visit: Payer: PPO

## 2023-07-21 ENCOUNTER — Encounter
Admission: RE | Admit: 2023-07-21 | Discharge: 2023-07-21 | Disposition: A | Discharge status: Home / Self Care | Payer: PPO | Source: Ambulatory Visit | Attending: Urology

## 2023-07-21 DIAGNOSIS — Z01812 Encounter for preprocedural laboratory examination: Secondary | ICD-10-CM | POA: Diagnosis not present

## 2023-07-21 DIAGNOSIS — N138 Other obstructive and reflux uropathy: Secondary | ICD-10-CM | POA: Diagnosis not present

## 2023-07-21 DIAGNOSIS — I10 Essential (primary) hypertension: Secondary | ICD-10-CM | POA: Insufficient documentation

## 2023-07-21 DIAGNOSIS — N401 Enlarged prostate with lower urinary tract symptoms: Secondary | ICD-10-CM | POA: Diagnosis not present

## 2023-07-21 LAB — URINALYSIS, COMPLETE
Bilirubin, UA: NEGATIVE
Ketones, UA: NEGATIVE
Leukocytes,UA: NEGATIVE
Nitrite, UA: NEGATIVE
Protein,UA: NEGATIVE
RBC, UA: NEGATIVE
Specific Gravity, UA: <1.005 — ABNORMAL LOW (ref 1.005–1.030)
Urobilinogen, Ur: 0.2 mg/dL (ref 0.2–1.0)
pH, UA: 5.5 (ref 5.0–7.5)

## 2023-07-21 LAB — MICROSCOPIC EXAMINATION
Bacteria, UA: NONE SEEN
Epithelial Cells (non renal): NONE SEEN /HPF (ref 0–10)
RBC, Urine: NONE SEEN /HPF (ref 0–2)

## 2023-07-21 LAB — CBC
HCT: 44 % (ref 39.0–52.0)
Hemoglobin: 14.7 g/dL (ref 13.0–17.0)
MCH: 30.6 pg (ref 26.0–34.0)
MCHC: 33.4 g/dL (ref 30.0–36.0)
MCV: 91.7 fL (ref 80.0–100.0)
Platelets: 169 10*3/uL (ref 150–400)
RBC: 4.8 MIL/uL (ref 4.22–5.81)
RDW: 14.3 % (ref 11.5–15.5)
WBC: 5.9 10*3/uL (ref 4.0–10.5)
nRBC: 0 % (ref 0.0–0.2)

## 2023-07-24 DIAGNOSIS — I25118 Atherosclerotic heart disease of native coronary artery with other forms of angina pectoris: Secondary | ICD-10-CM | POA: Diagnosis not present

## 2023-07-24 LAB — CULTURE, URINE COMPREHENSIVE

## 2023-07-26 MED ORDER — METOCLOPRAMIDE HCL 5 MG/ML IJ SOLN
INTRAMUSCULAR | Status: AC
  Filled 2023-07-26: qty 2

## 2023-07-27 ENCOUNTER — Encounter: Payer: Self-pay | Admitting: Urology

## 2023-07-27 MED ORDER — PROPOFOL 10 MG/ML IV BOLUS
INTRAVENOUS | Status: AC
  Filled 2023-07-27: qty 20

## 2023-07-27 MED ORDER — PROPOFOL 1000 MG/100ML IV EMUL
INTRAVENOUS | Status: AC
  Filled 2023-07-27: qty 100

## 2023-07-27 NOTE — Progress Notes (Signed)
Perioperative / Anesthesia Services  Pre-Admission Testing Clinical Review / Pre-Operative Anesthesia Consult  Date: 07/27/23  Patient Demographics:  Name: Andrew Lyons DOB:   1945/10/22 MRN:   [id number]  Planned Surgical Procedure(s):    Case: [id number] Date/Time: 07/28/23 0715   Procedure: HOLEP-LASER ENUCLEATION OF THE PROSTATE WITH MORCELLATION   Anesthesia type: General   Pre-op diagnosis: Benign Prostatic Hyperplasia   Location: [facility] OR ROOM 10 / [facility]S FOR AN[facility]IA [facility]   Surgeons: Sondra Come, MD     NOTE: Available PAT nursing documentation and vital signs have been reviewed. Clinical nursing staff has updated patient's PMH/PSHx, current medication list, and drug allergies/intolerances to ensure comprehensive history available to assist in medical decision making as it pertains to the aforementioned surgical procedure and anticipated anesthetic course. Extensive review of available clinical information personally performed. [place] [facility] and PSHx updated with any diagnoses/procedures that  may have been inadvertently omitted during his intake with the pre-admission testing department's nursing staff.  Clinical Discussion:  Andrew Lyons is a 77 y.o. male who is submitted for pre-surgical anesthesia review and clearance prior to him undergoing the above procedure. Patient is a Former Smoker (quit 09/2001). Pertinent PMH includes: CAD, NSTEMI, aortic stenosis (s/p AVR), diastolic dysfunction, PVD, BILATERAL carotid artery disease, aortic atherosclerosis, HTN, HLD, T2DM, COPD, OSAH (requires nocturnal PAP therapy), GERD (on daily PPI), Barrett's esophagus, seizures, statin mediated hepatitis, ED (on PDE5i), BPH, OA.  Patient is followed by cardiology Andrew Hanks, MD). He was last seen in the cardiology clinic on 07/12/2023; notes reviewed. At the time of his clinic visit, patient doing well overall from a cardiovascular perspective. Patient denied any chest pain,  shortness of breath, PND, orthopnea, palpitations, significant peripheral edema, weakness, fatigue, vertiginous symptoms, or presyncope/syncope. Patient with a past medical history significant for cardiovascular diagnoses. Documented physical exam was grossly benign, providing no evidence of acute exacerbation and/or decompensation of the patient's known cardiovascular conditions.  Patient suffered an NSTEMI on 02/13/2011.    Patient initially presented to [facility] for care.  He underwent diagnostic LEFT heart catheterization on 02/12/2002 revealing multivessel CAD; 40% proximal RCA, 80% mid RCA-1, 90% mid RCA-2, 40% distal RCA, 50% RPDA-1, 70% RPDA-2, 20% proximal LCx, 20% mid LCx, 30% OM1, 40% mid LAD-1, and 60% mid LAD-2.  Due to the degree and complexity of patient's coronary artery disease, patient was transferred to [facility] for complex PCI.  Patient underwent complex PCI at [facility] on 02/13/2002, at which time a total of 5 coronary stents were placed.  Patient had a 2.5 x 13 mm DES (unknown type) placed to the proximal-mid LAD, 2.25 x 7 mm BiodivYsio stent placed to the RPDA, and serial 2.25 x 23 mm, 3.0 x 18 mm, and 3.0 x 13 mm stent (unknown type) placed to the mid RCA.  Procedure yielded excellent angiographic result and TIMI-3 flow.  Patient with a history of bicuspid aortic valve resulting in severe valvular stenosis.  He ultimately underwent aortic valve replacement on 03/29/2011 placing a 25 mm Magna pericardial tissue valve.  Patient has a history of significant PVD.  He is most recent peripheral vascular catheterization procedure was performed on 10/25/2018 in the setting of threatening limb threatening ischemia.  PTA was performed placing stents and the RIGHT popliteal and SFA. Stents were placed to the RIGHT popliteal artery and the mid-distal SFA.  Most recent myocardial perfusion imaging study was  performed on 04/30/2021.  Study demonstrated normal left ventricular systolic function with an EF of 59%.  There were no regional wall motion abnormalities. TID ratio 1.1.  There was no evidence of stress-induced myocardial ischemia or arrhythmia; no scintigraphic evidence of scar.  Study determined to be normal and low risk.  Most recent TTE was performed on 07/24/2023 revealing normal left ventricular systolic function with an EF of >55%.  There were no regional wall motion abnormalities. Left ventricular diastolic Doppler parameters consistent with abnormal relaxation (G1DD).  Right ventricular size and function normal.  There was trivial pan valvular regurgitation.  Bioprosthetic aortic valve in place.  Gradient suggestive of mild aortic valve stenosis; 3.8 m/s; AVA (VTI) = 0.8 cm.  Given his PVD, patient on chronic daily oral antithrombotic therapy using clopidogrel.  Patient is reportedly compliant with therapy with no evidence or reports of GI/GU related bleeding.  Blood pressure reasonably controlled at 140/60 mmHg on currently prescribed CCB/ACEi (amlodipine-benazepril) and beta-blocker (metoprolol succinate) therapies. Patient is on rosuvastatin for his HLD diagnosis and ASCVD prevention. T2DM well controlled on currently prescribed regimen; last HgbA1c was 6.8% when checked on 07/06/2023.  In the setting of known cardiovascular diagnoses and concurrent T2DM, patient is on an SGLT2i (empagliflozin) for added cardiovascular and renovascular protection.  Additionally, given his cardiovascular diagnoses, it is important note that patient uses a PDE5i (tadalafil) for and erectile dysfunction diagnosis.  Patient does have an OSAH diagnosis and is reported to be compliant with prescribed nocturnal PAP therapy.  Functional capacity limited by patient's age, multiple medical comorbidities, and previous limb amputation.  With that said, patient is still able to complete all of his ADLs/IADLs without  significant cardiovascular limitation.  Per the DASI, patient is able to complete at least 4 METS of physical activity without experiencing any significant degrees of angina/anginal equivalent symptoms.  No changes were made to his medication regimen.  Patient to follow-up with outpatient cardiology in 1 year or sooner if needed.  Andrew Lyons is scheduled for an elective HOLEP-LASER ENUCLEATION OF THE PROSTATE WITH MORCELLATION on 07/28/2023 with Dr. Legrand Rams, MD.  Given patient's past medical history significant for cardiovascular diagnoses, presurgical cardiac clearance was sought by the PAT team. Per cardiology, "this patient is optimized for surgery and may proceed with the planned procedural course with a LOW risk of significant perioperative cardiovascular complications".  Again, this patient is on daily oral antithrombotic therapy. He has been instructed on recommendations for holding his clopidogrel for 5 days prior to his procedure with plans to restart as soon as postoperative bleeding risk felt to be minimized by his attending surgeon. The patient has been instructed that his last dose of his clopidogrel should be on 07/22/2023.  Patient denies previous perioperative complications with anesthesia in the past. In review of the available records, it is noted that patient underwent a general anesthetic course here at [facility] in 03/2020 without documented complications.      07/05/2023   10:21 AM 06/12/2023    1:07 PM 01/13/2023   10:53 AM  Vitals with BMI  Height 5\' 10"  5\' 10"  5\' 10"   Weight 247 lbs 250 lbs 252 lbs  BMI 35.44 35.87 36.16  Systolic 125 147 272  Diastolic 71 76 67  Pulse 76 71 73    Providers/Specialists:   NOTE: Primary physician provider listed below. Patient may have been seen by APP or partner within same practice.   PROVIDER ROLE / SPECIALTY LAST OV  Sondra Come, MD Urology (Surgeon) 07/05/2023   Bosie Clos, MD Primary Care Provider 07/06/2023  Rudean Hitt, MD Cardiology 07/12/2023   Allergies:  Iodinated contrast media, Zyban [bupropion], Zocor [simvastatin], and Gadolinium derivatives  Current Home Medications:   No current facility-administered medications for this encounter.    amLODipine-benazepril (LOTREL) 5-40 MG capsule   cholecalciferol (VITAMIN D) 25 MCG (1000 UT) tablet   clopidogrel (PLAVIX) 75 MG tablet   Coenzyme Q10 100 MG TABS   fluticasone (FLONASE) 50 MCG/ACT nasal spray   JARDIANCE 25 MG TABS tablet   ketoconazole (NIZORAL) 2 % cream   metFORMIN (GLUCOPHAGE) 500 MG tablet   metoprolol succinate (TOPROL-XL) 100 MG 24 hr tablet   montelukast (SINGULAIR) 10 MG tablet   omeprazole (PRILOSEC) 40 MG capsule   rosuvastatin (CRESTOR) 20 MG tablet   silodosin (RAPAFLO) 8 MG CAPS capsule   tadalafil (CIALIS) 5 MG tablet   tirzepatide (MOUNJARO) 7.5 MG/0.5ML Pen   vitamin B-12 (CYANOCOBALAMIN) 1000 MCG tablet   ascorbic acid (VITAMIN C) 250 MG CHEW   B-D UF III MINI PEN NEEDLES 31G X 5 MM MISC   BLACK ELDERBERRY PO   Lancets (ONETOUCH ULTRASOFT) lancets   ONETOUCH ULTRA test strip   History:   Past Medical History:  Diagnosis Date   Adenomatous polyps    Aortic atherosclerosis (HCC)    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    a.) severe; s/p AVR  03/29/2011   Arthritis    Barrett's esophagus with dysplasia    Bilateral carotid artery disease (HCC)    BPH (benign prostatic hyperplasia)    Cataract    COPD (chronic obstructive pulmonary disease) (HCC)    Coronary artery disease 02/12/2002   a.) LHC 02/12/02: 40 pRCA, 80/90 mRCA, 40 dRCA, 50/70 RPDA, 20 pLCx, 20 mLCx, 30 OM1, 40/60 mLAD -> Tx to [facility]; b.) comp PCI 02/13/02: 2.5 x 13mm DES (unk type) to p-mLAD, 2.25 x 7mm BiodivYsio DES to RPDA, 2.25 x 23mm + 3.0 x 18mm + 3.0 x 13mm (unk type); c.) LHC 02/14/11: 25 pLAD-1, 10 ISR pLAD-2, 30 mLAD, 30 mLCx, 30 pRCA, 40 mRCA, 30 dRCA - med  mgmt; c.) MV 10/29/19: no isch; d.) MV 05/03/21: no isch   Diastolic dysfunction    a.) TTE 04/05/2017: EF >55%, mild LVH, G1DD, midl LAE, mild MR/TR/PR, biopros AoV (MPG 10); TTE 12/04/2019: EF >55%, mild LVH, G1DD, mild BAE, mild RVE, triv TR/PR, mild MR, biopros AoV (MPG 12); c.) TTE 04/30/2021: EF >55%, mild LVH, G1DD, mod LAE, triv MR/TR, biopros AoV;  d.) TTE 07/24/2023: EF >55%, G1DD, triv panval regurgitation, mild AS (AVA = 0.8 cm2)   ED (erectile dysfunction)    a.) on PDE5i (tadalafil)   GERD (gastroesophageal reflux disease)    History of 2019 novel coronavirus disease (COVID-19)    Hx of BKA, right (HCC)    Hx of dysplastic nevus 10/19/2006   L upper back, medial to mid scapula, slight to moderate atypia   Hx of dysplastic nevus 03/18/2008   R upper back 4.0cm lat to spine, moderate to marked atypia, excised 04/15/2008   Hx of dysplastic nevus 09/22/2008   R mid back 5.0cm lat to spine, moderate atypia   Hx of dysplastic nevus 04/06/2009   L mid to low back 11.0cm lat to spine, mild atypia   Hyperlipidemia    Hypertension    Internal hemorrhoids    Long term current use of clopidogrel    NSTEMI (non-ST  elevated myocardial infarction) (HCC) 02/12/2002   a.) LHC 02/12/2002: complex multi-vessel CAD--> [facility] to [facility]; b.) complex PCI 02/13/2002: 2.5 x 13 mm DES (unknown type) to p-mLAD, 2.25 x 7 mm BiodivYsio DES to RPDA, serial 2.25 x 23 mm + 3.0 x 18 mm + 3.0 x 13 mm to mRCA (unknown type)   OAB (overactive bladder)    OSA on CPAP    Peripheral vascular disease (HCC)    a.) s/p RIGHT BKA   Presence of stent in coronary artery    a.) TOTAL of 5 as of 07/27/2023 (p-mLAD x1, RPDA x 1, mRCA x 3)   S/P AVR (aortic valve replacement) 03/29/2011   a.) 25 mm Magna pericardial tissue bioprosthetic valve   Seizures (HCC)    Statin induced/mediated hepatitis    T2DM (type 2 diabetes mellitus) (HCC)    Past Surgical History:  Procedure Laterality Date   ABDOMINAL AORTOGRAM  W/LOWER EXTREMITY N/A 06/20/2018   Procedure: ABDOMINAL AORTOGRAM W/LOWER EXTREMITY;  Surgeon: Iran Ouch, MD;  Location: [facility] INVASIVE CV LAB;  Service: Cardiovascular;  Laterality: N/A;   AMPUTATION Right 10/27/2018   Procedure: AMPUTATION BELOW KNEE;  Surgeon: Signa Kell, MD;  Location: [facility] ORS;  Service: Orthopedics;  Laterality: Right;   AORTIC VALVE REPLACEMENT N/A 03/29/2011   Procedure: AORTIC VALVE REPLACEMENT; Location: [facility]; Surgeon: Judd Gaudier, MD   COLONOSCOPY N/A 03/26/2020   Procedure: COLONOSCOPY;  Surgeon: Sung Amabile, DO;  Location: [facility] ORS;  Service: General;  Laterality: N/A;   COLONOSCOPY WITH PROPOFOL N/A 03/18/2015   Procedure: COLONOSCOPY WITH PROPOFOL;  Surgeon: Scot Jun, MD;  Location: [facility] ENDOSCOPY;  Service: Endoscopy;  Laterality: N/A;   CORONARY ANGIOPLASTY WITH STENT PLACEMENT Left 02/13/2002   Procedure: CORONARY ANGIOPLASTY WITH STENT PLACEMENT; Location: [facility]   ESOPHAGOGASTRODUODENOSCOPY N/A 03/18/2015   Procedure: ESOPHAGOGASTRODUODENOSCOPY (EGD);  Surgeon: Scot Jun, MD;  Location: [facility] ENDOSCOPY;  Service: Endoscopy;  Laterality: N/A;   FOOT AMPUTATION Right    GREEN LIGHT LASER TURP (TRANSURETHRAL RESECTION OF PROSTATE N/A 10/16/2018   Procedure: GREEN LIGHT LASER TURP (TRANSURETHRAL RESECTION OF PROSTATE;  Surgeon: Orson Ape, MD;  Location: [facility] ORS;  Service: Urology;  Laterality: N/A;   HARDWARE REMOVAL Right 10/27/2018   Procedure: HARDWARE REMOVAL;  Surgeon: Signa Kell, MD;  Location: [facility] ORS;  Service: Orthopedics;  Laterality: Right;   HEMORRHOID SURGERY N/A 03/26/2020   Procedure: HEMORRHOIDECTOMY;  Surgeon: Sung Amabile, DO;  Location: [facility] ORS;  Service: General;  Laterality: N/A;   LEFT HEART CATH AND CORONARY ANGIOGRAPHY Left 02/12/2002   Procedure: LEFT HEART CATH AND CORONARY ANGIOGRAPHY; Location: [facility]; Surgeon: Arnoldo Hooker, MD   LOWER EXTREMITY ANGIOGRAPHY Right 10/25/2018   Procedure: Lower  Extremity Angiography;  Surgeon: Annice Needy, MD;  Location: [facility] INVASIVE CV LAB;  Service: Cardiovascular;  Laterality: Right;   PERIPHERAL VASCULAR INTERVENTION Right 06/20/2018   Procedure: PERIPHERAL VASCULAR INTERVENTION;  Surgeon: Iran Ouch, MD;  Location: [facility] CV LAB;  Service: Cardiovascular;  Laterality: Right;   THORACOTOMY Right    TONSILLECTOMY     Family History  Problem Relation Age of Onset   Obesity Son    Diabetes Brother    Hypertension Brother    Heart attack Mother    Heart attack Father    Social History   Tobacco Use   Smoking status: Former    Current packs/day: 0.00    Types: Cigarettes    Quit date: 10/02/2001    Years since quitting: 21.8  Passive exposure: Past   Smokeless tobacco: Former   Tobacco comments:    Quit smoking in 2003; Started smoking at age 57, smoked about 40 years, smoked over 3 packs per day  Vaping Use   Vaping status: Never Used  Substance Use Topics   Alcohol use: Not Currently   Drug use: No    Pertinent Clinical Results:  LABS:   Lab Results  Component Value Date   WBC 5.9 07/21/2023   HGB 14.7 07/21/2023   HCT 44.0 07/21/2023   MCV 91.7 07/21/2023   PLT 169 07/21/2023   Component Ref Range & Units 07/06/2023  Glucose 70 - 110 mg/dL 578 High   Sodium 469 - 145 mmol/L 139  Potassium 3.6 - 5.1 mmol/L 4.4  Chloride 97 - 109 mmol/L 104  Carbon Dioxide (CO2) 22.0 - 32.0 mmol/L 28.7  Calcium 8.7 - 10.3 mg/dL 9.8  Urea Nitrogen (BUN) 7 - 25 mg/dL 19  Creatinine 0.7 - 1.3 mg/dL 1.4 High   Glomerular Filtration Rate (eGFR) >60 mL/min/1.73sq m 52 Low   BUN/Crea Ratio 6.0 - 20.0 13.6  Anion Gap w/K 6.0 - 16.0 10.7  Resulting Agency [facility]   Specimen Collected: 07/06/23 11:47   Performed by: [facility] Last Resulted: 07/06/23 16:07  Received From: [facility]  Result Received: 07/07/23 07:03    ECG: Date: 07/12/2023 Time ECG  obtained: 1112 AM Rate: 73 bpm Rhythm: normal sinus Axis (leads I and aVF): Normal Intervals: PR 160 ms. QRS 98 ms. QTc 429 ms. ST segment and T wave changes: Inferolateral ST and T wave abnormalities  Comparison: Similar to previous tracing obtained on 07/06/2022   IMAGING / PROCEDURES: TRANSTHORACIC ECHOCARDIOGRAM performed on 07/24/2023 Normal left ventricular systolic function with an EF of >55% Left ventricular diastolic Doppler parameters consistent with abnormal relaxation (G1DD). Normal right ventricular systolic function Trivial pan valvular regurgitation Mild aortic valve stenosis; AVA (VTI) = 0.8 cm No pericardial effusion  MYOCARDIAL PERFUSION IMAGING STUDY (LEXISCAN) performed on 04/30/2021 Normal left ventricular systolic function with a normal LVEF of 59% Normal myocardial thickening and wall motion Left ventricular cavity size normal SPECT images demonstrate homogenous tracer distribution throughout the myocardium No evidence of stress-induced myocardial ischemia or arrhythmia Normal low risk study  Impression and Plan:  Andrew Lyons has been referred for pre-anesthesia review and clearance prior to him undergoing the planned anesthetic and procedural courses. Available labs, pertinent testing, and imaging results were personally reviewed by me in preparation for upcoming operative/procedural course. [facility] medical record has been updated following extensive record review and patient interview with PAT staff.   This patient has been appropriately cleared by cardiology with an overall LOW risk of experiencing significant perioperative cardiovascular complications. Based on clinical review performed today (07/27/23), barring any significant acute changes in the patient's overall condition, it is anticipated that he will be able to proceed with the planned surgical intervention. Any acute changes in clinical condition may necessitate his procedure being postponed  and/or cancelled. Patient will meet with anesthesia team (MD and/or CRNA) on the day of his procedure for preoperative evaluation/assessment. Questions regarding anesthetic course will be fielded at that time.   Pre-surgical instructions were reviewed with the patient during his PAT appointment, and questions were fielded to satisfaction by PAT clinical staff. He has been instructed on which medications that he will need to hold prior to surgery, as well as the ones that have been deemed safe/appropriate  to take on the day of his procedure. As part of the general education provided by PAT, patient made aware both verbally and in writing, that he would need to abstain from the use of any illegal substances during his perioperative course.  He was advised that failure to follow the provided instructions could necessitate case cancellation or result in serious perioperative complications up to and including death. Patient encouraged to contact PAT and/or his surgeon's office to discuss any questions or concerns that may arise prior to surgery; verbalized understanding.   Quentin Mulling, MSN, APRN, FNP-C, CEN [facility]  Perioperative Services Nurse Practitioner Phone: [phone] Fax: [phone] 07/27/23 1:29 PM  NOTE: This note has been prepared using Dragon dictation software. Despite my best ability to proofread, there is always the potential that unintentional transcriptional errors may still occur from this process.

## 2023-07-28 ENCOUNTER — Other Ambulatory Visit: Payer: Self-pay

## 2023-07-28 ENCOUNTER — Ambulatory Visit: Payer: PPO | Admitting: Urgent Care

## 2023-07-28 ENCOUNTER — Encounter: Payer: Self-pay | Admitting: Urology

## 2023-07-28 ENCOUNTER — Encounter
Admission: RE | Disposition: A | Discharge status: Home / Self Care | Payer: Self-pay | Source: Home / Self Care | Attending: Urology

## 2023-07-28 ENCOUNTER — Ambulatory Visit
Admission: RE | Admit: 2023-07-28 | Discharge: 2023-07-28 | Disposition: A | Discharge status: Home / Self Care | Payer: PPO | Attending: Urology | Admitting: Urology

## 2023-07-28 DIAGNOSIS — I252 Old myocardial infarction: Secondary | ICD-10-CM | POA: Insufficient documentation

## 2023-07-28 DIAGNOSIS — E785 Hyperlipidemia, unspecified: Secondary | ICD-10-CM | POA: Diagnosis not present

## 2023-07-28 DIAGNOSIS — N401 Enlarged prostate with lower urinary tract symptoms: Secondary | ICD-10-CM | POA: Diagnosis not present

## 2023-07-28 DIAGNOSIS — Z79899 Other long term (current) drug therapy: Secondary | ICD-10-CM | POA: Insufficient documentation

## 2023-07-28 DIAGNOSIS — K219 Gastro-esophageal reflux disease without esophagitis: Secondary | ICD-10-CM | POA: Insufficient documentation

## 2023-07-28 DIAGNOSIS — I2510 Atherosclerotic heart disease of native coronary artery without angina pectoris: Secondary | ICD-10-CM | POA: Diagnosis not present

## 2023-07-28 DIAGNOSIS — G4733 Obstructive sleep apnea (adult) (pediatric): Secondary | ICD-10-CM | POA: Diagnosis not present

## 2023-07-28 DIAGNOSIS — Z87891 Personal history of nicotine dependence: Secondary | ICD-10-CM | POA: Diagnosis not present

## 2023-07-28 DIAGNOSIS — Z952 Presence of prosthetic heart valve: Secondary | ICD-10-CM | POA: Diagnosis not present

## 2023-07-28 DIAGNOSIS — N138 Other obstructive and reflux uropathy: Secondary | ICD-10-CM | POA: Diagnosis not present

## 2023-07-28 DIAGNOSIS — N400 Enlarged prostate without lower urinary tract symptoms: Secondary | ICD-10-CM | POA: Diagnosis not present

## 2023-07-28 DIAGNOSIS — I10 Essential (primary) hypertension: Secondary | ICD-10-CM | POA: Insufficient documentation

## 2023-07-28 DIAGNOSIS — E1151 Type 2 diabetes mellitus with diabetic peripheral angiopathy without gangrene: Secondary | ICD-10-CM | POA: Insufficient documentation

## 2023-07-28 DIAGNOSIS — N312 Flaccid neuropathic bladder, not elsewhere classified: Secondary | ICD-10-CM | POA: Insufficient documentation

## 2023-07-28 DIAGNOSIS — J449 Chronic obstructive pulmonary disease, unspecified: Secondary | ICD-10-CM | POA: Diagnosis not present

## 2023-07-28 DIAGNOSIS — I350 Nonrheumatic aortic (valve) stenosis: Secondary | ICD-10-CM | POA: Diagnosis not present

## 2023-07-28 DIAGNOSIS — E119 Type 2 diabetes mellitus without complications: Secondary | ICD-10-CM

## 2023-07-28 HISTORY — DX: Disorder of arteries and arterioles, unspecified: I77.9

## 2023-07-28 HISTORY — PX: HOLEP-LASER ENUCLEATION OF THE PROSTATE WITH MORCELLATION: SHX6641

## 2023-07-28 HISTORY — DX: Personal history of COVID-19: Z86.16

## 2023-07-28 HISTORY — DX: Long term (current) use of antithrombotics/antiplatelets: Z79.02

## 2023-07-28 HISTORY — DX: Other ill-defined heart diseases: I51.89

## 2023-07-28 HISTORY — DX: Benign neoplasm, unspecified site: D36.9

## 2023-07-28 HISTORY — DX: Other hemorrhoids: K64.8

## 2023-07-28 HISTORY — DX: Obstructive sleep apnea (adult) (pediatric): G47.33

## 2023-07-28 HISTORY — DX: Male erectile dysfunction, unspecified: N52.9

## 2023-07-28 HISTORY — DX: Atherosclerosis of aorta: I70.0

## 2023-07-28 HISTORY — DX: Benign prostatic hyperplasia without lower urinary tract symptoms: N40.0

## 2023-07-28 HISTORY — DX: Adverse effect of unspecified drugs, medicaments and biological substances, initial encounter: T50.905A

## 2023-07-28 HISTORY — DX: Presence of coronary angioplasty implant and graft: Z95.5

## 2023-07-28 HISTORY — DX: Type 2 diabetes mellitus without complications: E11.9

## 2023-07-28 LAB — GLUCOSE, CAPILLARY
Glucose-Capillary: 180 mg/dL — ABNORMAL HIGH (ref 70–99)
Glucose-Capillary: 193 mg/dL — ABNORMAL HIGH (ref 70–99)

## 2023-07-28 SURGERY — ENUCLEATION, PROSTATE, USING LASER, WITH MORCELLATION
Anesthesia: General

## 2023-07-28 MED ORDER — ONDANSETRON HCL 4 MG/2ML IJ SOLN
INTRAMUSCULAR | Status: DC | PRN
  Administered 2023-07-28 (×2): 4 mg via INTRAVENOUS

## 2023-07-28 MED ORDER — SODIUM CHLORIDE 0.9 % IR SOLN
Status: DC | PRN
  Administered 2023-07-28: 21000 mL via INTRAVESICAL

## 2023-07-28 MED ORDER — PROPOFOL 1000 MG/100ML IV EMUL
INTRAVENOUS | Status: AC
  Filled 2023-07-28: qty 100

## 2023-07-28 MED ORDER — LACTATED RINGERS IV SOLN: INTRAVENOUS | Status: DC

## 2023-07-28 MED ORDER — CHLORHEXIDINE GLUCONATE 0.12 % MT SOLN
OROMUCOSAL | Status: AC
  Filled 2023-07-28: qty 15

## 2023-07-28 MED ORDER — ONDANSETRON HCL 4 MG/2ML IJ SOLN: 4.0000 mg | Freq: Once | INTRAMUSCULAR | Status: DC | PRN

## 2023-07-28 MED ORDER — CEFAZOLIN SODIUM-DEXTROSE 2-4 GM/100ML-% IV SOLN
2.0000 g | INTRAVENOUS | Status: AC
  Administered 2023-07-28: 2 g via INTRAVENOUS

## 2023-07-28 MED ORDER — GLYCOPYRROLATE 0.2 MG/ML IJ SOLN
INTRAMUSCULAR | Status: DC | PRN
  Administered 2023-07-28: .2 mg via INTRAVENOUS

## 2023-07-28 MED ORDER — CHLORHEXIDINE GLUCONATE 0.12 % MT SOLN
15.0000 mL | Freq: Once | OROMUCOSAL | Status: AC
Start: 2023-07-28 — End: 2023-07-28
  Administered 2023-07-28: 15 mL via OROMUCOSAL

## 2023-07-28 MED ORDER — TRAMADOL HCL 50 MG PO TABS: 50.0000 mg | ORAL_TABLET | Freq: Four times a day (QID) | ORAL | 0 refills | Status: AC | PRN

## 2023-07-28 MED ORDER — FENTANYL CITRATE (PF) 100 MCG/2ML IJ SOLN
INTRAMUSCULAR | Status: AC
  Filled 2023-07-28: qty 2

## 2023-07-28 MED ORDER — ACETAMINOPHEN 10 MG/ML IV SOLN
INTRAVENOUS | Status: AC
  Filled 2023-07-28: qty 100

## 2023-07-28 MED ORDER — ACETAMINOPHEN 10 MG/ML IV SOLN
INTRAVENOUS | Status: DC | PRN
  Administered 2023-07-28: 1000 mg via INTRAVENOUS

## 2023-07-28 MED ORDER — ORAL CARE MOUTH RINSE: 15.0000 mL | Freq: Once | OROMUCOSAL | Status: AC

## 2023-07-28 MED ORDER — LIDOCAINE HCL (CARDIAC) PF 100 MG/5ML IV SOSY
PREFILLED_SYRINGE | INTRAVENOUS | Status: DC | PRN
  Administered 2023-07-28: 100 mg via INTRAVENOUS

## 2023-07-28 MED ORDER — FENTANYL CITRATE (PF) 100 MCG/2ML IJ SOLN
INTRAMUSCULAR | Status: DC | PRN
  Administered 2023-07-28 (×2): 50 ug via INTRAVENOUS

## 2023-07-28 MED ORDER — MIDAZOLAM HCL 2 MG/2ML IJ SOLN
INTRAMUSCULAR | Status: AC
  Filled 2023-07-28: qty 2

## 2023-07-28 MED ORDER — OXYCODONE HCL 5 MG/5ML PO SOLN: 5.0000 mg | Freq: Once | ORAL | Status: DC | PRN

## 2023-07-28 MED ORDER — SODIUM CHLORIDE 0.9 % IV SOLN: INTRAVENOUS | Status: DC

## 2023-07-28 MED ORDER — ROCURONIUM BROMIDE 100 MG/10ML IV SOLN
INTRAVENOUS | Status: DC | PRN
  Administered 2023-07-28: 10 mg via INTRAVENOUS
  Administered 2023-07-28: 40 mg via INTRAVENOUS

## 2023-07-28 MED ORDER — CEFAZOLIN SODIUM-DEXTROSE 2-4 GM/100ML-% IV SOLN
INTRAVENOUS | Status: AC
  Filled 2023-07-28: qty 100

## 2023-07-28 MED ORDER — SUGAMMADEX SODIUM 200 MG/2ML IV SOLN
INTRAVENOUS | Status: DC | PRN
  Administered 2023-07-28: 200 mg via INTRAVENOUS

## 2023-07-28 MED ORDER — PROPOFOL 10 MG/ML IV BOLUS
INTRAVENOUS | Status: DC | PRN
  Administered 2023-07-28: 200 mg via INTRAVENOUS

## 2023-07-28 MED ORDER — SUCCINYLCHOLINE CHLORIDE 200 MG/10ML IV SOSY
PREFILLED_SYRINGE | INTRAVENOUS | Status: DC | PRN
  Administered 2023-07-28: 100 mg via INTRAVENOUS

## 2023-07-28 MED ORDER — FENTANYL CITRATE (PF) 100 MCG/2ML IJ SOLN: 25.0000 ug | INTRAMUSCULAR | Status: DC | PRN

## 2023-07-28 MED ORDER — OXYCODONE HCL 5 MG PO TABS: 5.0000 mg | ORAL_TABLET | Freq: Once | ORAL | Status: DC | PRN

## 2023-07-28 SURGICAL SUPPLY — 38 items
ADAPTER IRRIG TUBE 2 SPIKE SOL (ADAPTER) ×2 IMPLANT
ADPR TBG 2 SPK PMP STRL ASCP (ADAPTER) ×2
BAG DRN LRG CPC RND TRDRP CNTR (MISCELLANEOUS) ×1
BAG DRN RND TRDRP ANRFLXCHMBR (UROLOGICAL SUPPLIES) ×1
BAG URINE DRAIN 2000ML AR STRL (UROLOGICAL SUPPLIES) ×1 IMPLANT
BAG URO DRAIN 4000ML (MISCELLANEOUS) ×1 IMPLANT
CATH FOLEY 3WAY 30CC 24FR (CATHETERS) ×1
CATH URETL OPEN END 4X70 (CATHETERS) ×1 IMPLANT
CATH URTH STD 24FR FL 3W 2 (CATHETERS) ×1 IMPLANT
CONTAINER COLLECT MORCELLATR (MISCELLANEOUS) ×1 IMPLANT
DRAPE UTILITY 15X26 TOWEL STRL (DRAPES) IMPLANT
ELECT BIVAP BIPO 22/24 DONUT (ELECTROSURGICAL)
ELECTRD BIVAP BIPO 22/24 DONUT (ELECTROSURGICAL) IMPLANT
FIBER LASER MOSES 550 DFL (Laser) ×1 IMPLANT
FILTER OVERFLOW MORCELLATOR (FILTER) ×1 IMPLANT
GLOVE BIOGEL PI IND STRL 7.5 (GLOVE) ×1 IMPLANT
GOWN STRL REUS W/ TWL LRG LVL3 (GOWN DISPOSABLE) ×1 IMPLANT
GOWN STRL REUS W/ TWL XL LVL3 (GOWN DISPOSABLE) ×1 IMPLANT
GOWN STRL REUS W/TWL LRG LVL3 (GOWN DISPOSABLE) ×1
GOWN STRL REUS W/TWL XL LVL3 (GOWN DISPOSABLE) ×1
GUIDEWIRE GREEN .038 145CM (MISCELLANEOUS) IMPLANT
HOLDER FOLEY CATH W/STRAP (MISCELLANEOUS) ×1 IMPLANT
IV NS IRRIG 3000ML ARTHROMATIC (IV SOLUTION) ×5 IMPLANT
KIT TURNOVER CYSTO (KITS) ×1 IMPLANT
MBRN O SEALING YLW 17 FOR INST (MISCELLANEOUS) ×1
MEMBRANE SLNG YLW 17 FOR INST (MISCELLANEOUS) ×1 IMPLANT
MORCELLATOR COLLECT CONTAINER (MISCELLANEOUS) ×1
MORCELLATOR OVERFLOW FILTER (FILTER) ×1
MORCELLATOR ROTATION 4.75 335 (MISCELLANEOUS) ×1 IMPLANT
PACK CYSTO AR (MISCELLANEOUS) ×1 IMPLANT
SET CYSTO W/LG BORE CLAMP LF (SET/KITS/TRAYS/PACK) ×1 IMPLANT
SET IRRIG Y TYPE TUR BLADDER L (SET/KITS/TRAYS/PACK) ×1 IMPLANT
SLEEVE PROTECTION STRL DISP (MISCELLANEOUS) ×2 IMPLANT
SURGILUBE 2OZ TUBE FLIPTOP (MISCELLANEOUS) ×1 IMPLANT
SYR TOOMEY IRRIG 70ML (MISCELLANEOUS) ×1
SYRINGE TOOMEY IRRIG 70ML (MISCELLANEOUS) ×1 IMPLANT
TUBE PUMP MORCELLATOR PIRANHA (TUBING) ×1 IMPLANT
WATER STERILE IRR 1000ML POUR (IV SOLUTION) ×1 IMPLANT

## 2023-07-28 NOTE — H&P (Signed)
07/28/23 6:49 AM   Andrew Lyons 1946-09-20 [id number]  CC: BPH, incomplete emptying  HPI: 77 year old male referred from Dr. Lonna Cobb for consideration of HOLEP.  He was also previously followed by Dr. Sheppard Penton and has a history of a UroLift as well as a greenlight laser PVP in 2020, neither of these helped his urinary symptoms, and it sounds like he had a fair amount of dysuria and irritative symptoms after PVP.  His primary complaint is nocturia every 1 to 1-1/2 hours overnight as well as weak stream, difficulty initiating stream, and frequency during the day.  He has undergone extensive workup with Dr. Lonna Cobb.  Cystoscopy showed distal prostatic obstructive appearing adenoma and volume measured 32g, he also had urodynamics in [place] that showed bladder capacity , first sensation , no instability, voluntary contraction voiding with a peak flow rate of 60ml/s with detrusor pressure of only 9 cm of water consistent with atonic bladder, and PVR was elevated at .   We discussed options including outlet procedure with HOLEP that tends to be helpful even in patients with atonic bladder, or treatments like chronic catheterization or CIC.  He is interested in an outlet procedure, and understands his complex history and component of atonic bladder.   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    a.) severe; s/p AVR  03/29/2011   Arthritis    Barrett's esophagus with dysplasia    Bilateral carotid artery disease (HCC)    BPH (benign prostatic hyperplasia)    Cataract    COPD (chronic obstructive pulmonary disease) (HCC)    Coronary artery disease 02/12/2002   a.) LHC 02/12/02: 40 pRCA, 80/90 mRCA, 40 dRCA, 50/70 RPDA, 20 pLCx, 20 mLCx, 30 OM1, 40/60 mLAD -> Tx to [facility]; b.) comp PCI 02/13/02: 2.5 x 13mm DES (unk type) to p-mLAD, 2.25 x 7mm BiodivYsio DES to RPDA, 2.25 x  23mm + 3.0 x 18mm + 3.0 x 13mm (unk type); c.) LHC 02/14/11: 25 pLAD-1, 10 ISR pLAD-2, 30 mLAD, 30 mLCx, 30 pRCA, 40 mRCA, 30 dRCA - med mgmt; c.) MV 10/29/19: no isch; d.) MV 05/03/21: no isch   Diastolic dysfunction    a.) TTE 04/05/2017: EF >55%, mild LVH, G1DD, midl LAE, mild MR/TR/PR, biopros AoV (MPG 10); TTE 12/04/2019: EF >55%, mild LVH, G1DD, mild BAE, mild RVE, triv TR/PR, mild MR, biopros AoV (MPG 12); c.) TTE 04/30/2021: EF >55%, mild LVH, G1DD, mod LAE, triv MR/TR, biopros AoV;  d.) TTE 07/24/2023: EF >55%, G1DD, triv panval regurgitation, mild AS (AVA = 0.8 cm2)   ED (erectile dysfunction)    a.) on PDE5i (tadalafil)   GERD (gastroesophageal reflux disease)    History of 2019 novel coronavirus disease (COVID-19)    Hx of BKA, right (HCC)    Hx of dysplastic nevus 10/19/2006   L upper back, medial to mid scapula, slight to moderate atypia   Hx of dysplastic nevus 03/18/2008   R upper back 4.0cm lat to spine, moderate to marked atypia, excised 04/15/2008   Hx of dysplastic nevus 09/22/2008   R mid back 5.0cm lat to spine, moderate atypia   Hx of dysplastic nevus 04/06/2009   L mid to low back 11.0cm lat to spine, mild atypia   Hyperlipidemia    Hypertension    Internal hemorrhoids    Long term current use of clopidogrel    NSTEMI (  non-ST elevated myocardial infarction) (HCC) 02/12/2002   a.) LHC 02/12/2002: complex multi-vessel CAD--> transferred to [facility]; b.) complex PCI 02/13/2002: 2.5 x 13 mm DES (unknown type) to p-mLAD, 2.25 x 7 mm BiodivYsio DES to RPDA, serial 2.25 x 23 mm + 3.0 x 18 mm + 3.0 x 13 mm to mRCA (unknown type)   OAB (overactive bladder)    OSA on CPAP    Peripheral vascular disease (HCC)    a.) s/p RIGHT BKA   Presence of stent in coronary artery    a.) TOTAL of 5 as of 07/27/2023 (p-mLAD x1, RPDA x 1, mRCA x 3)   S/P AVR (aortic valve replacement) 03/29/2011   a.) 25 mm Magna pericardial tissue bioprosthetic valve   Seizures (HCC)    Statin induced/mediated  hepatitis    T2DM (type 2 diabetes mellitus) (HCC)     Surgical History: Past Surgical History:  Procedure Laterality Date   ABDOMINAL AORTOGRAM W/LOWER EXTREMITY N/A 06/20/2018   Procedure: ABDOMINAL AORTOGRAM W/LOWER EXTREMITY;  Surgeon: Iran Ouch, MD;  [facility];  Service: Cardiovascular;  Laterality: N/A;   AMPUTATION Right 10/27/2018   Procedure: AMPUTATION BELOW KNEE;  Surgeon: Signa Kell, MD;  Location: [facility];  Service: Orthopedics;  Laterality: Right;   AORTIC VALVE REPLACEMENT N/A 03/29/2011   Procedure: AORTIC VALVE REPLACEMENT; Location: [facility]; Surgeon: Judd Gaudier, MD   COLONOSCOPY N/A 03/26/2020   Procedure: COLONOSCOPY;  Surgeon: Sung Amabile, DO;  Location: [facility];  Service: General;  Laterality: N/A;   COLONOSCOPY WITH PROPOFOL N/A 03/18/2015   Procedure: COLONOSCOPY WITH PROPOFOL;  Surgeon: Scot Jun, MD;  Location: [facility] ENDOSCOPY;  Service: Endoscopy;  Laterality: N/A;   CORONARY ANGIOPLASTY WITH STENT PLACEMENT Left 02/13/2002   Procedure: CORONARY ANGIOPLASTY WITH STENT PLACEMENT; Location: [facility]   ESOPHAGOGASTRODUODENOSCOPY N/A 03/18/2015   Procedure: ESOPHAGOGASTRODUODENOSCOPY (EGD);  Surgeon: Scot Jun, MD;  Location: [facility] ENDOSCOPY;  Service: Endoscopy;  Laterality: N/A;   FOOT AMPUTATION Right    GREEN LIGHT LASER TURP (TRANSURETHRAL RESECTION OF PROSTATE N/A 10/16/2018   Procedure: GREEN LIGHT LASER TURP (TRANSURETHRAL RESECTION OF PROSTATE;  Surgeon: Orson Ape, MD;  Location: [facility];  Service: Urology;  Laterality: N/A;   HARDWARE REMOVAL Right 10/27/2018   Procedure: HARDWARE REMOVAL;  Surgeon: Signa Kell, MD;  Location: [facility];  Service: Orthopedics;  Laterality: Right;   HEMORRHOID SURGERY N/A 03/26/2020   Procedure: HEMORRHOIDECTOMY;  Surgeon: Sung Amabile, DO;  Location: [facility];  Service: General;  Laterality: N/A;   LEFT HEART CATH AND CORONARY ANGIOGRAPHY Left 02/12/2002   Procedure: LEFT  HEART CATH AND CORONARY ANGIOGRAPHY; Location: [facility]; Surgeon: Arnoldo Hooker, MD   LOWER EXTREMITY ANGIOGRAPHY Right 10/25/2018   Procedure: Lower Extremity Angiography;  Surgeon: Annice Needy, MD;  Location: [facility];  Service: Cardiovascular;  Laterality: Right;   PERIPHERAL VASCULAR INTERVENTION Right 06/20/2018   Procedure: PERIPHERAL VASCULAR INTERVENTION;  Surgeon: Iran Ouch, MD;  [facility];  Service: Cardiovascular;  Laterality: Right;   THORACOTOMY Right    TONSILLECTOMY      Family History: Family History  Problem Relation Age of Onset   Obesity Son    Diabetes Brother    Hypertension Brother    Heart attack Mother    Heart attack Father     Social History:  reports that he quit smoking about 21 years ago. His smoking use included cigarettes. He has been exposed to tobacco smoke. He has quit using  smokeless tobacco. He reports that he does not currently use alcohol. He reports that he does not use drugs.  Physical Exam: BP 132/60 (BP Location: Left Arm)   Pulse 62   Temp 98 F (36.7 C) (Oral)   Resp 18   Ht 5\' 10"  (1.778 m)   Wt 113.4 kg   SpO2 97%   BMI 35.87 kg/m    Constitutional:  Alert and oriented, No acute distress. Cardiovascular: Regular rate and rhythm Respiratory: Clear to auscultation bilaterally GI: Abdomen is soft, nontender, nondistended, no abdominal masses   Laboratory Data: 10/25 culture no gorwth  Assessment & Plan:   77 year old male previously followed by Dr. Sheppard Penton underwent UroLift as well as greenlight laser PVP with minimal improvement in his urinary symptoms,Cystoscopy showed distal prostatic obstructive appearing adenoma and volume measured 32g, he also had urodynamics in [place] that showed bladder capacity , first sensation , no instability, voluntary contraction voiding with a peak flow rate of 57ml/s with detrusor pressure of only 9 cm of water consistent with atonic bladder, and  PVR was elevated at .  We reviewed at length risk of incomplete emptying despite HOLEP with his atonic bladder.  We discussed the risks and benefits of HoLEP at length.  The procedure requires general anesthesia and takes 1 to 2 hours, and a holmium laser is used to enucleate the prostate and push this tissue into the bladder.  A morcellator is then used to remove this tissue, which is sent for pathology.  The vast majority(>95%) of patients are able to discharge the same day with a catheter in place for 2 to 3 days, and will follow-up in clinic for a voiding trial.  We specifically discussed the risks of bleeding, infection, retrograde ejaculation, temporary urgency and urge incontinence, very low risk of long-term incontinence, urethral stricture/bladder neck contracture, pathologic evaluation of prostate tissue and possible detection of prostate cancer or other malignancy, and possible need for additional procedures.  HoLEP today   Legrand Rams, MD 07/28/2023  [facility] [address], [address] [place], [place] [address] [phone]

## 2023-07-28 NOTE — Op Note (Addendum)
Date of procedure: 07/28/23  Preoperative diagnosis:  BPH with obstruction  Postoperative diagnosis:  Same  Procedure: HoLEP (Holmium Laser Enucleation of the Prostate)  Surgeon: Legrand Rams, MD  Anesthesia: General  Complications: None  Intraoperative findings:  History of prior UroLift and PVP, residual obstructive apical tissue, especially left lateral lobe, elevated bladder neck.  Numerous UroLift clips extracted Uncomplicated HOLEP, excellent hemostasis, verumontanum and ureteral orifices intact at conclusion of case  EBL: Minimal  Specimens: Prostate chips  Enucleation time: 15 minutes  Morcellation time: 6 minutes  Intra-op weight: 20g  Drains: 24 French three-way, 60 cc in balloon  Indication: Andrew Lyons is a 77 y.o. patient with BPH and obstruction, prior history of UroLift and PVP by prior urologist with residual obstructive symptoms and documented incomplete emptying.  He also had urodynamic showing bladder capacity , first sensation , no instability, voluntary contraction voiding with a peak flow rate of 11ml/s with detrusor pressure of only 9 cm of water consistent with atonic bladder, and PVR was elevated at .  He opted for HOLEP to try to improve voiding symptoms and emptying.  We discussed at length the impact of an atonic bladder on long-term symptoms and function.  After reviewing the management options for treatment, they elected to proceed with the above surgical procedure(s). We have discussed the potential benefits and risks of the procedure, side effects of the proposed treatment, the likelihood of the patient achieving the goals of the procedure, and any potential problems that might occur during the procedure or recuperation.  We specifically discussed the risks of bleeding, infection, hematuria and clot retention, need for additional procedures, possible overnight hospital stay, temporary urgency and incontinence, rare  long-term incontinence, and retrograde ejaculation.  Informed consent has been obtained.   Description of procedure:  The patient was taken to the operating room and general anesthesia was induced.  The patient was placed in the dorsal lithotomy position, prepped and draped in the usual sterile fashion, and preoperative antibiotics(Ancef) were administered.  SCD for DVT prophylaxis was placed on the left leg only with his history of right lower extremity amputation. A preoperative time-out was performed.   Andrew Lyons sounds were used to gently dilated the urethra up to 72F. The 21 French continuous flow resectoscope was inserted into the urethra using the visual obturator  The prostate was abnormal appearing from prior UroLift and PVP, there was residual obstructive apical tissue, especially the left lateral lobe, as well as an elevated bladder neck. The bladder was thoroughly inspected and notable for moderate trabeculations but no suspicious lesions.  The ureteral orifices were located in orthotopic position.    The laser was set to 2 J and 60 Hz and early apical release was performed by making a circumferential mucosal incision proximal to the sphincter.  A lambda incision was then made proximal to the verumontanum.  The prostate was enucleated en bloc circumferentially into the bladder.  Numerous UroLift clips were evacuated from the bladder.  The capsule was examined and laser was used for meticulous hemostasis.    The 83 French resectoscope was then switched out for the 26 French nephroscope and prostate tissue was morcellated(Piranha) and the tissue sent to pathology.  A 24 French three-way catheter was unable to be inserted with the aid of a catheter guide, and I repassed the scope into the bladder and advanced a Super Stiff wire.  A hole was cut in the tip of the Foley, and then advanced easily over  the Super Stiff wire into the bladder.  60 cc were placed in the balloon.  Urine was clear.  The  catheter irrigated easily with a Toomey syringe.  CBI was initiated.   The patient tolerated the procedure well without any immediate complications and was extubated and transferred to the recovery room in stable condition.  Urine was clear on fast CBI.  Disposition: Stable to PACU  Plan: Wean CBI in PACU, anticipate discharge home today with Foley removal in clinic in 2-3 days Can resume Plavix Wednesday if urine clear  Legrand Rams, MD 07/28/2023

## 2023-07-28 NOTE — Anesthesia Postprocedure Evaluation (Signed)
Anesthesia Post Note  Patient: Andrew Lyons  Procedure(s) Performed: HOLEP-LASER ENUCLEATION OF THE PROSTATE WITH MORCELLATION  Patient location during evaluation: PACU Anesthesia Type: General Level of consciousness: awake Pain management: satisfactory to patient Vital Signs Assessment: post-procedure vital signs reviewed and stable Respiratory status: spontaneous breathing Cardiovascular status: stable Anesthetic complications: no   No notable events documented.   Last Vitals:  Vitals:   07/28/23 0934 07/28/23 0943  BP:  (!) 142/80  Pulse: (!) 59 (!) 58  Resp: 15 16  Temp:  (!) 36.2 C  SpO2: 96% 95%    Last Pain:  Vitals:   07/28/23 0943  TempSrc: Temporal  PainSc: 0-No pain                 VAN STAVEREN,Sunnie Odden

## 2023-07-28 NOTE — Transfer of Care (Signed)
Immediate Anesthesia Transfer of Care Note  Patient: Andrew Lyons  Procedure(s) Performed: HOLEP-LASER ENUCLEATION OF THE PROSTATE WITH MORCELLATION  Patient Location: PACU  Anesthesia Type:General  Level of Consciousness: awake, drowsy, and patient cooperative  Airway & Oxygen Therapy: Patient Spontanous Breathing and Patient connected to face mask oxygen  Post-op Assessment: Report given to RN and Post -op Vital signs reviewed and stable  Post vital signs: Reviewed and stable  Last Vitals:  Vitals Value Taken Time  BP 164/75 07/28/23 0837  Temp 36.4 C 07/28/23 0837  Pulse 65 07/28/23 0844  Resp 12 07/28/23 0844  SpO2 99 % 07/28/23 0844  Vitals shown include unfiled device data.  Last Pain:  Vitals:   07/28/23 0622  TempSrc: Oral  PainSc: 0-No pain         Complications: No notable events documented.

## 2023-07-28 NOTE — Anesthesia Procedure Notes (Signed)
Procedure Name: Intubation Date/Time: 07/28/2023 7:36 AM  Performed by: Mohammed Kindle, CRNAPre-anesthesia Checklist: Patient identified, Emergency Drugs available, Suction available and Patient being monitored Patient Re-evaluated:Patient Re-evaluated prior to induction Oxygen Delivery Method: Circle system utilized Preoxygenation: Pre-oxygenation with 100% oxygen Induction Type: IV induction Ventilation: Mask ventilation without difficulty Laryngoscope Size: 3 and McGraph Grade View: Grade I Tube type: Oral Tube size: 7.0 mm Number of attempts: 1 Airway Equipment and Method: Stylet Placement Confirmation: ETT inserted through vocal cords under direct vision, positive ETCO2, breath sounds checked- equal and bilateral and CO2 detector Secured at: 21 cm Tube secured with: Tape Dental Injury: Teeth and Oropharynx as per pre-operative assessment

## 2023-07-28 NOTE — Anesthesia Preprocedure Evaluation (Addendum)
Anesthesia Evaluation  Patient identified by MRN, date of birth, ID band Patient awake    Reviewed: Allergy & Precautions, NPO status , Patient's Chart, lab work & pertinent test results  Airway Mallampati: III  TM Distance: >3 FB Neck ROM: full    Dental  (+) Edentulous Upper, Edentulous Lower   Pulmonary sleep apnea , COPD, former smoker   Pulmonary exam normal  + decreased breath sounds      Cardiovascular Exercise Tolerance: Poor hypertension, Pt. on medications + CAD, + Cardiac Stents and + Peripheral Vascular Disease  Normal cardiovascular exam+ Valvular Problems/Murmurs  Rhythm:Regular Rate:Normal  S/p aortic valve replacement, hx of severe aortic stenosis   Neuro/Psych Seizures -, Well Controlled,  negative neurological ROS  negative psych ROS   GI/Hepatic negative GI ROS, Neg liver ROS,GERD  Medicated,,(+) Hepatitis -, Unspecified  Endo/Other  diabetes, Well Controlled, Type 2, Oral Hypoglycemic Agents  Morbid obesity  Renal/GU   negative genitourinary   Musculoskeletal   Abdominal  (+) + obese  Peds negative pediatric ROS (+)  Hematology negative hematology ROS (+)   Anesthesia Other Findings Past Medical History: No date: Adenomatous polyps No date: Aortic atherosclerosis (HCC) No date: 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     Comment:  a.) severe; s/p AVR  03/29/2011 No date: Arthritis No date: Barrett's esophagus with dysplasia No date: Bilateral carotid artery disease (HCC) No date: BPH (benign prostatic hyperplasia) No date: Cataract No date: COPD (chronic obstructive pulmonary disease) (HCC) 02/12/2002: Coronary artery disease     Comment:  a.) LHC 02/12/02: 40 pRCA, 80/90 mRCA, 40 dRCA, 50/70               RPDA, 20 pLCx, 20 mLCx, 30 OM1, 40/60 mLAD -> Tx to [facility];              b.) comp PCI 02/13/02: 2.5 x 13mm DES (unk type) to               p-mLAD, 2.25 x 7mm BiodivYsio DES to RPDA,  2.25 x 23mm +               3.0 x 18mm + 3.0 x 13mm (unk type); c.) LHC 02/14/11: 25               pLAD-1, 10 ISR pLAD-2, 30 mLAD, 30 mLCx, 30 pRCA, 40               mRCA, 30 dRCA - med mgmt; c.) MV 10/29/19: no isch; d.) MV               05/03/21: no isch No date: Diastolic dysfunction     Comment:  a.) TTE 04/05/2017: EF >55%, mild LVH, G1DD, midl LAE,               mild MR/TR/PR, biopros AoV (MPG 10); TTE 12/04/2019: EF               >55%, mild LVH, G1DD, mild BAE, mild RVE, triv TR/PR,               mild MR, biopros AoV (MPG 12); c.) TTE 04/30/2021: EF               >55%, mild LVH, G1DD, mod LAE, triv MR/TR, biopros AoV;                d.) TTE 07/24/2023: EF >55%, G1DD, triv panval  regurgitation, mild AS (AVA = 0.8 cm2) No date: ED (erectile dysfunction)     Comment:  a.) on PDE5i (tadalafil) No date: GERD (gastroesophageal reflux disease) No date: History of 2019 novel coronavirus disease (COVID-19) No date: Hx of BKA, right (HCC) 10/19/2006: Hx of dysplastic nevus     Comment:  L upper back, medial to mid scapula, slight to moderate               atypia 03/18/2008: Hx of dysplastic nevus     Comment:  R upper back 4.0cm lat to spine, moderate to marked               atypia, excised 04/15/2008 09/22/2008: Hx of dysplastic nevus     Comment:  R mid back 5.0cm lat to spine, moderate atypia 04/06/2009: Hx of dysplastic nevus     Comment:  L mid to low back 11.0cm lat to spine, mild atypia No date: Hyperlipidemia No date: Hypertension No date: Internal hemorrhoids No date: Long term current use of clopidogrel 02/12/2002: NSTEMI (non-ST elevated myocardial infarction) [facility])     Comment:  a.) LHC 02/12/2002: complex multi-vessel CAD-->               transferred to [facility]; b.) complex PCI 02/13/2002: 2.5 x 13              mm DES (unknown type) to p-mLAD, 2.25 x 7 mm BiodivYsio               DES to RPDA, serial 2.25 x 23 mm + 3.0 x 18 mm + 3.0 x 13              mm to mRCA  (unknown type) No date: OAB (overactive bladder) No date: OSA on CPAP No date: Peripheral vascular disease (HCC)     Comment:  a.) s/p RIGHT BKA No date: Presence of stent in coronary artery     Comment:  a.) TOTAL of 5 as of 07/27/2023 (p-mLAD x1, RPDA x 1,               mRCA x 3) 03/29/2011: S/P AVR (aortic valve replacement)     Comment:  a.) 25 mm Magna pericardial tissue bioprosthetic valve No date: Seizures (HCC) No date: Statin induced/mediated hepatitis No date: T2DM (type 2 diabetes mellitus) (HCC)  Past Surgical History: 06/20/2018: ABDOMINAL AORTOGRAM W/LOWER EXTREMITY; N/A     Comment:  Procedure: ABDOMINAL AORTOGRAM W/LOWER EXTREMITY;                Surgeon: Iran Ouch, MD;  [facility];  Service: Cardiovascular;  Laterality: N/A; 10/27/2018: AMPUTATION; Right     Comment:  Procedure: AMPUTATION BELOW KNEE;  Surgeon: Signa Kell, MD;  Location: [facility] ORS;  Service: Orthopedics;                Laterality: Right; 03/29/2011: AORTIC VALVE REPLACEMENT; N/A     Comment:  Procedure: AORTIC VALVE REPLACEMENT; Location: [facility];               Surgeon: Judd Gaudier, MD 03/26/2020: COLONOSCOPY; N/A     Comment:  Procedure: COLONOSCOPY;  Surgeon: Sung Amabile, DO;                Location: [facility] ORS;  Service: General;  Laterality: N/A; 03/18/2015: COLONOSCOPY  WITH PROPOFOL; N/A     Comment:  Procedure: COLONOSCOPY WITH PROPOFOL;  Surgeon: Scot Jun, MD;  Location: [facility] ENDOSCOPY;  Service:               Endoscopy;  Laterality: N/A; 02/13/2002: CORONARY ANGIOPLASTY WITH STENT PLACEMENT; Left     Comment:  Procedure: CORONARY ANGIOPLASTY WITH STENT PLACEMENT;               Location: [facility] 03/18/2015: ESOPHAGOGASTRODUODENOSCOPY; N/A     Comment:  Procedure: ESOPHAGOGASTRODUODENOSCOPY (EGD);  Surgeon:               Scot Jun, MD;  Location: [facility] ENDOSCOPY;                Service: Endoscopy;  Laterality:  N/A; No date: FOOT AMPUTATION; Right 10/16/2018: GREEN LIGHT LASER TURP (TRANSURETHRAL RESECTION OF  PROSTATE; N/A     Comment:  Procedure: GREEN LIGHT LASER TURP (TRANSURETHRAL               RESECTION OF PROSTATE;  Surgeon: Orson Ape, MD;                Location: [facility] OR[facility];  Service: Urology;  Laterality: N/A; 10/27/2018: HARDWARE REMOVAL; Right     Comment:  Procedure: HARDWARE REMOVAL;  Surgeon: Signa Kell, MD;              Location: [facility] OR[facility];  Service: Orthopedics;  Laterality:               Right; 03/26/2020: HEMORRHOID SURGERY; N/A     Comment:  Procedure: HEMORRHOIDECTOMY;  Surgeon: Sung Amabile, DO;              Location: [facility] OR[facility];  Service: General;  Laterality: N/A; 02/12/2002: LEFT HEART CATH AND CORONARY ANGIOGRAPHY; Left     Comment:  Procedure: LEFT HEART CATH AND CORONARY ANGIOGRAPHY;               Location: [facility]; Surgeon: Arnoldo Hooker, MD 10/25/2018: LOWER EXTREMITY ANGIOGRAPHY; Right     Comment:  Procedure: Lower Extremity Angiography;  Surgeon: Annice Needy, MD;  Location: [facility];  Service:               Cardiovascular;  Laterality: Right; 06/20/2018: PERIPHERAL VASCULAR INTERVENTION; Right     Comment:  Procedure: PERIPHERAL VASCULAR INTERVENTION;  Surgeon:               Iran Ouch, MD;  [facility];                Service: Cardiovascular;  Laterality: Right; No date: THORACOTOMY; Right No date: TONSILLECTOMY  BMI    Body Mass Index: 35.87 kg/m      Reproductive/Obstetrics negative OB ROS                              Anesthesia Physical Anesthesia Plan  ASA: 3  Anesthesia Plan: General   Post-op Pain Management:    Induction: Intravenous  PONV Risk Score and Plan: Ondansetron, Dexamethasone, Midazolam and Treatment may vary due to age or medical condition  Airway Management Planned: Oral ETT  Additional Equipment:   Intra-op Plan:   Post-operative Plan:  Extubation in OR  Informed Consent: I have  reviewed the patients History and Physical, chart, labs and discussed the procedure including the risks, benefits and alternatives for the proposed anesthesia with the patient or authorized representative who has indicated his/her understanding and acceptance.     Dental Advisory Given  Plan Discussed with: CRNA and Surgeon  Anesthesia Plan Comments:         Anesthesia Quick Evaluation

## 2023-07-29 ENCOUNTER — Other Ambulatory Visit: Payer: Self-pay | Admitting: Urology

## 2023-07-31 ENCOUNTER — Ambulatory Visit: Payer: PPO | Admitting: Physician Assistant

## 2023-07-31 ENCOUNTER — Encounter: Payer: Self-pay | Admitting: Physician Assistant

## 2023-07-31 VITALS — BP 184/77 | HR 86 | Ht 70.0 in | Wt 250.0 lb

## 2023-07-31 DIAGNOSIS — N401 Enlarged prostate with lower urinary tract symptoms: Secondary | ICD-10-CM

## 2023-07-31 DIAGNOSIS — N138 Other obstructive and reflux uropathy: Secondary | ICD-10-CM

## 2023-07-31 LAB — SURGICAL PATHOLOGY

## 2023-07-31 NOTE — Patient Instructions (Signed)
Congratulations on your recent HOLEP procedure! As discussed in clinic today, these are the three normal postoperative findings after this surgery: Burning or pain with urination: This typically resolves within 1 week of surgery. If you are still having significant pain with urination 10 days after surgery, please call our clinic. We may need to check you for a urinary tract infection at that point, though this is rare. Blood in the urine: This may either come and go or steadily improve before going away, but typically resolves completely within 3 weeks of surgery. If you are on blood thinners, it may take longer for the bleeding to resolve. Please note that you may find that you pass clumps of tissue or debris around 10-14 days after surgery associated with some new bleeding. This is due to sloughing of your postoperative scab and is also normal. If at any point you start to pass dark red urine; thick, ketchup-like urine; or large blood clots around the size of your palm, please call our office immediately. Urinary leakage or urgency: This tends to improve with time, with most patients becoming dry within around 3 months of surgery. You may wear absorbant underwear or liners for security during this time. To help you get dry faster, please make sure you are completing your Kegel exercises as instructed, with a set of 10 exercises completed up to three times daily. Further information on Kegel exercises can be found in the back of this packet.

## 2023-07-31 NOTE — Progress Notes (Signed)
Catheter Removal  Patient is present today for a catheter removal.  56ml of water was drained from the balloon. A 24FR three-way foley cath was removed from the bladder, no complications were noted. Patient tolerated well.  Performed by: Carman Ching, PA-C   Additional notes: Counseled patient on normal postoperative findings including dysuria, gross hematuria, and urinary urgency/leakage. Counseled patient to begin Kegel exercises 3x10 sets daily to increase urinary control and wear absorbent products as needed for security. Written and verbal resources provided today. Surgical pathology pending; will defer to Dr. Richardo Hanks to share results when available. Reiterated guidance to resume blood thinner on Wednesday if urine is clear.  Follow up/ Additional notes: Return in about 4 months (around 11/28/2023) for Postop f/u with Dr. Richardo Hanks.

## 2023-08-10 ENCOUNTER — Other Ambulatory Visit (INDEPENDENT_AMBULATORY_CARE_PROVIDER_SITE_OTHER): Payer: Self-pay | Admitting: Vascular Surgery

## 2023-08-10 DIAGNOSIS — I739 Peripheral vascular disease, unspecified: Secondary | ICD-10-CM

## 2023-08-15 ENCOUNTER — Encounter (INDEPENDENT_AMBULATORY_CARE_PROVIDER_SITE_OTHER): Payer: Self-pay | Admitting: Vascular Surgery

## 2023-08-15 ENCOUNTER — Ambulatory Visit (INDEPENDENT_AMBULATORY_CARE_PROVIDER_SITE_OTHER): Payer: PPO | Admitting: Vascular Surgery

## 2023-08-15 ENCOUNTER — Ambulatory Visit (INDEPENDENT_AMBULATORY_CARE_PROVIDER_SITE_OTHER): Payer: PPO

## 2023-08-15 VITALS — BP 174/82 | HR 73 | Resp 18 | Ht 70.0 in | Wt 241.6 lb

## 2023-08-15 DIAGNOSIS — Z89511 Acquired absence of right leg below knee: Secondary | ICD-10-CM

## 2023-08-15 DIAGNOSIS — I739 Peripheral vascular disease, unspecified: Secondary | ICD-10-CM

## 2023-08-15 DIAGNOSIS — I10 Essential (primary) hypertension: Secondary | ICD-10-CM | POA: Diagnosis not present

## 2023-08-15 DIAGNOSIS — E7800 Pure hypercholesterolemia, unspecified: Secondary | ICD-10-CM

## 2023-08-15 DIAGNOSIS — E1159 Type 2 diabetes mellitus with other circulatory complications: Secondary | ICD-10-CM | POA: Diagnosis not present

## 2023-08-15 NOTE — Assessment & Plan Note (Signed)
ABIs today remain in the normal range on the left side at 1.20 with biphasic waveforms and digital pressure of 106.  No role for intervention.  On antiplatelet and statin agent.  1 year with ABIs.

## 2023-08-15 NOTE — Progress Notes (Signed)
MRN : [id number]  Andrew Lyons is a 77 y.o. (Feb 20, 1946) male who presents with chief complaint of  Chief Complaint  Patient presents with   Follow-up    f/u in 1 year with abi  .  History of Present Illness: Patient returns today in follow up of his PAD.  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  He has had no major issues or problems from his vascular disease since his last visit.  No ulceration, infection, rest pain, or claudication symptoms on the left leg. ABIs today remain in the normal range on the left side at 1.20 with biphasic waveforms and digital pressure of 106.   Current Outpatient Medications  Medication Sig Dispense Refill   amLODipine-benazepril (LOTREL) 5-40 MG capsule Take 1 capsule by mouth daily. 90 capsule 1   ascorbic acid (VITAMIN C) 250 MG CHEW Chew 250 mg by mouth daily.     B-D UF III MINI PEN NEEDLES 31G X 5 MM MISC USE WITH PEN DAILY 100 each 3   BLACK ELDERBERRY PO Take by mouth daily.     cholecalciferol (VITAMIN D) 25 MCG (1000 UT) tablet Take 1,000 Units by mouth daily.      clopidogrel (PLAVIX) 75 MG tablet TAKE 1 TABLET BY MOUTH ONCE A DAY 90 tablet 0   Coenzyme Q10 100 MG TABS Take 100 mg by mouth daily.      fluticasone (FLONASE) 50 MCG/ACT nasal spray USE 2 SPRAYS INTO EACH NOSTRIL EVERY DAY 48 g 5   JARDIANCE 25 MG TABS tablet TAKE 1 TABLET BY MOUTH ONCE DAILY 90 tablet 3   ketoconazole (NIZORAL) 2 % cream Apply to entire feet at bedtime 60 g 6   Lancets (ONETOUCH ULTRASOFT) lancets USE 1  TO CHECK GLUCOSE THREE TIMES DAILY 100 each 11   metFORMIN (GLUCOPHAGE) 500 MG tablet TAKE 1 TABLET BY MOUTH TWICE A DAY WITH A MEAL 180 tablet 1   metoprolol succinate (TOPROL-XL) 100 MG 24 hr tablet TAKE 1 TABLET BY MOUTH ONCE A DAY WITH OR IMMEDIATELY FOLLOWING A MEAL 90 tablet 3   montelukast (SINGULAIR) 10 MG tablet Take 1 tablet (10 mg total) by mouth daily. 90 tablet 3   omeprazole (PRILOSEC) 40 MG  capsule Take 1 capsule (40 mg total) by mouth daily. 90 capsule 3   ONETOUCH ULTRA test strip USE 1 STRIP TO CHECK GLUCOSE TWICE DAILY AS NEEDED 100 each 0   rosuvastatin (CRESTOR) 20 MG tablet TAKE 1 TABLET BY MOUTH ONCE A DAY 90 tablet 3   tadalafil (CIALIS) 5 MG tablet TAKE ONE TABLET BY MOUTH ONCE DAILY AS NEEDED FOR ERECTILE DYSFUNCTION (Patient taking differently: Take 5 mg by mouth daily.) 90 tablet 4   tirzepatide (MOUNJARO) 7.5 MG/0.5ML Pen Inject 7.5 mg into the skin once a week. 6 mL 1   vitamin B-12 (CYANOCOBALAMIN) 1000 MCG tablet Take 1,000 mcg by mouth daily.     No current facility-administered medications for this visit.    Past Medical History:  Diagnosis Date   Adenomatous polyps    Aortic atherosclerosis (HCC)    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    a.) severe; s/p AVR  03/29/2011   Arthritis    Barrett's esophagus with dysplasia    Bilateral carotid artery disease (HCC)    BPH (benign prostatic hyperplasia)    Cataract    COPD (chronic obstructive pulmonary disease) (HCC)    Coronary artery disease  02/12/2002   a.) LHC 02/12/02: 40 pRCA, 80/90 mRCA, 40 dRCA, 50/70 RPDA, 20 pLCx, 20 mLCx, 30 OM1, 40/60 mLAD -> Tx to [facility]; b.) comp PCI 02/13/02: 2.5 x 13mm DES (unk type) to p-mLAD, 2.25 x 7mm BiodivYsio DES to RPDA, 2.25 x 23mm + 3.0 x 18mm + 3.0 x 13mm (unk type); c.) LHC 02/14/11: 25 pLAD-1, 10 ISR pLAD-2, 30 mLAD, 30 mLCx, 30 pRCA, 40 mRCA, 30 dRCA - med mgmt; c.) MV 10/29/19: no isch; d.) MV 05/03/21: no isch   Diastolic dysfunction    a.) TTE 04/05/2017: EF >55%, mild LVH, G1DD, midl LAE, mild MR/TR/PR, biopros AoV (MPG 10); TTE 12/04/2019: EF >55%, mild LVH, G1DD, mild BAE, mild RVE, triv TR/PR, mild MR, biopros AoV (MPG 12); c.) TTE 04/30/2021: EF >55%, mild LVH, G1DD, mod LAE, triv MR/TR, biopros AoV;  d.) TTE 07/24/2023: EF >55%, G1DD, triv panval regurgitation, mild AS (AVA = 0.8 cm2)   ED (erectile dysfunction)    a.) on PDE5i (tadalafil)   GERD  (gastroesophageal reflux disease)    History of 2019 novel coronavirus disease (COVID-19)    Hx of BKA, right (HCC)    Hx of dysplastic nevus 10/19/2006   L upper back, medial to mid scapula, slight to moderate atypia   Hx of dysplastic nevus 03/18/2008   R upper back 4.0cm lat to spine, moderate to marked atypia, excised 04/15/2008   Hx of dysplastic nevus 09/22/2008   R mid back 5.0cm lat to spine, moderate atypia   Hx of dysplastic nevus 04/06/2009   L mid to low back 11.0cm lat to spine, mild atypia   Hyperlipidemia    Hypertension    Internal hemorrhoids    Long term current use of clopidogrel    NSTEMI (non-ST elevated myocardial infarction) (HCC) 02/12/2002   a.) LHC 02/12/2002: complex multi-vessel CAD--> transferred to [facility]; b.) complex PCI 02/13/2002: 2.5 x 13 mm DES (unknown type) to p-mLAD, 2.25 x 7 mm BiodivYsio DES to RPDA, serial 2.25 x 23 mm + 3.0 x 18 mm + 3.0 x 13 mm to mRCA (unknown type)   OAB (overactive bladder)    OSA on CPAP    Peripheral vascular disease (HCC)    a.) s/p RIGHT BKA   Presence of stent in coronary artery    a.) TOTAL of 5 as of 07/27/2023 (p-mLAD x1, RPDA x 1, mRCA x 3)   S/P AVR (aortic valve replacement) 03/29/2011   a.) 25 mm Magna pericardial tissue bioprosthetic valve   Seizures (HCC)    Statin induced/mediated hepatitis    T2DM (type 2 diabetes mellitus) (HCC)     Past Surgical History:  Procedure Laterality Date   ABDOMINAL AORTOGRAM W/LOWER EXTREMITY N/A 06/20/2018   Procedure: ABDOMINAL AORTOGRAM W/LOWER EXTREMITY;  Surgeon: Iran Ouch, MD;  Location: [facility];  Service: Cardiovascular;  Laterality: N/A;   AMPUTATION Right 10/27/2018   Procedure: AMPUTATION BELOW KNEE;  Surgeon: Signa Kell, MD;  Location: [facility] ORS;  Service: Orthopedics;  Laterality: Right;   AORTIC VALVE REPLACEMENT N/A 03/29/2011   Procedure: AORTIC VALVE REPLACEMENT; Location: [facility]; Surgeon: Judd Gaudier, MD   COLONOSCOPY N/A 03/26/2020    Procedure: COLONOSCOPY;  Surgeon: Sung Amabile, DO;  Location: [facility] ORS;  Service: General;  Laterality: N/A;   COLONOSCOPY WITH PROPOFOL N/A 03/18/2015   Procedure: COLONOSCOPY WITH PROPOFOL;  Surgeon: Scot Jun, MD;  Location: [facility] ENDOSCOPY;  Service: Endoscopy;  Laterality: N/A;   CORONARY ANGIOPLASTY WITH STENT PLACEMENT Left  02/13/2002   Procedure: CORONARY ANGIOPLASTY WITH STENT PLACEMENT; Location: [facility]   ESOPHAGOGASTRODUODENOSCOPY N/A 03/18/2015   Procedure: ESOPHAGOGASTRODUODENOSCOPY (EGD);  Surgeon: Scot Jun, MD;  Location: [facility] ENDOSCOPY;  Service: Endoscopy;  Laterality: N/A;   FOOT AMPUTATION Right    GREEN LIGHT LASER TURP (TRANSURETHRAL RESECTION OF PROSTATE N/A 10/16/2018   Procedure: GREEN LIGHT LASER TURP (TRANSURETHRAL RESECTION OF PROSTATE;  Surgeon: Orson Ape, MD;  Location: [facility] ORS;  Service: Urology;  Laterality: N/A;   HARDWARE REMOVAL Right 10/27/2018   Procedure: HARDWARE REMOVAL;  Surgeon: Signa Kell, MD;  Location: [facility] ORS;  Service: Orthopedics;  Laterality: Right;   HEMORRHOID SURGERY N/A 03/26/2020   Procedure: HEMORRHOIDECTOMY;  Surgeon: Sung Amabile, DO;  Location: [facility] ORS;  Service: General;  Laterality: N/A;   HOLEP-LASER ENUCLEATION OF THE PROSTATE WITH MORCELLATION N/A 07/28/2023   Procedure: HOLEP-LASER ENUCLEATION OF THE PROSTATE WITH MORCELLATION;  Surgeon: Sondra Come, MD;  Location: [facility] ORS;  Service: Urology;  Laterality: N/A;   LEFT HEART CATH AND CORONARY ANGIOGRAPHY Left 02/12/2002   Procedure: LEFT HEART CATH AND CORONARY ANGIOGRAPHY; Location: [facility]; Surgeon: Arnoldo Hooker, MD   LOWER EXTREMITY ANGIOGRAPHY Right 10/25/2018   Procedure: Lower Extremity Angiography;  Surgeon: Annice Needy, MD;  Location: [facility] INVASIVE CV LAB;  Service: Cardiovascular;  Laterality: Right;   PERIPHERAL VASCULAR INTERVENTION Right 06/20/2018   Procedure: PERIPHERAL VASCULAR INTERVENTION;  Surgeon: Iran Ouch, MD;  Location: [facility]  INVASIVE CV LAB;  Service: Cardiovascular;  Laterality: Right;   THORACOTOMY Right    TONSILLECTOMY       Social History   Tobacco Use   Smoking status: Former    Current packs/day: 0.00    Types: Cigarettes    Quit date: 10/02/2001    Years since quitting: 21.8    Passive exposure: Past   Smokeless tobacco: Former   Tobacco comments:    Quit smoking in 2003; Started smoking at age 50, smoked about 40 years, smoked over 3 packs per day  Vaping Use   Vaping status: Never Used  Substance Use Topics   Alcohol use: Not Currently   Drug use: No      Family History  Problem Relation Age of Onset   Obesity Son    Diabetes Brother    Hypertension Brother    Heart attack Mother    Heart attack Father      Allergies  Allergen Reactions   Iodinated Contrast Media Rash    had a red chest -unusre of what kind of dye it was   Zyban [Bupropion] Rash   Zocor [Simvastatin] Other (See Comments)    Causes hepatitis    Gadolinium Derivatives Rash     REVIEW OF SYSTEMS (Negative unless checked)   Constitutional: [] Weight loss  [] Fever  [] Chills Cardiac: [] Chest pain   [] Chest pressure   [] Palpitations   [] Shortness of breath when laying flat   [] Shortness of breath at rest   [] Shortness of breath with exertion. Vascular:  [] Pain in legs with walking   [] Pain in legs at rest   [] Pain in legs when laying flat   [] Claudication   [] Pain in feet when walking  [] Pain in feet at rest  [] Pain in feet when laying flat   [] History of DVT   [] Phlebitis   [] Swelling in legs   [] Varicose veins   [x] Non-healing ulcers Pulmonary:   [] Uses home oxygen   [] Productive cough   [] Hemoptysis   [] Wheeze  [x] COPD   [] Asthma Neurologic:  []   Dizziness  [] Blackouts   [x] Seizures   [] History of stroke   [] History of TIA  [] Aphasia   [] Temporary blindness   [] Dysphagia   [] Weakness or numbness in arms   [] Weakness or numbness in legs Musculoskeletal:  [x] Arthritis   [] Joint swelling   [] Joint pain   [] Low back  pain Hematologic:  [] Easy bruising  [] Easy bleeding   [] Hypercoagulable state   [] Anemic   Gastrointestinal:  [] Blood in stool   [] Vomiting blood  [x] Gastroesophageal reflux/heartburn   [x] Abdominal pain Genitourinary:  [] Chronic kidney disease   [] Difficult urination  [] Frequent urination  [] Burning with urination   [] Hematuria Skin:  [] Rashes   [x] Ulcers   [x] Wounds Psychological:  [] History of anxiety   []  History of major depression.  Physical Examination  BP (!) 174/82 (BP Location: Right Wrist)   Pulse 73   Resp 18   Ht 5\' 10"  (1.778 m)   Wt 241 lb 9.6 oz (109.6 kg)   BMI 34.67 kg/m  Gen:  WD/WN, NAD Head: [place]/AT, No temporalis wasting. Ear/Nose/Throat: Hearing grossly intact, nares w/o erythema or drainage Eyes: Conjunctiva clear. Sclera non-icteric Neck: Supple.  Trachea midline Pulmonary:  Good air movement, no use of accessory muscles.  Cardiac: RRR, no JVD Vascular:  Vessel Right Left  Radial Palpable Palpable                          PT Not Palpable Palpable  DP Not Palpable Palpable   Gastrointestinal: soft, non-tender/non-distended. No guarding/reflex.  Musculoskeletal: M/S 5/5 throughout.  No deformity or atrophy. Right BKA.  No left leg edema. Neurologic: Sensation grossly intact in extremities.  Symmetrical.  Speech is fluent.  Psychiatric: Judgment intact, Mood & affect appropriate for pt's clinical situation. Dermatologic: No rashes or ulcers noted.  No cellulitis or open wounds.      Labs Recent Results (from the past 2160 hour(s))  CULTURE, URINE COMPREHENSIVE     Status: None   Collection Time: 07/21/23 10:15 AM   Specimen: Urine   UR  Result Value Ref Range   Urine Culture, Comprehensive Final report    Organism ID, Bacteria Comment     Comment: No growth in 36 - 48 hours.  Urinalysis, Complete     Status: Abnormal   Collection Time: 07/21/23 10:15 AM  Result Value Ref Range   Specific Gravity, UA <1.005 (L) 1.005 - 1.030   pH, UA 5.5  5.0 - 7.5   Color, UA Yellow Yellow   Appearance Ur Clear Clear   Leukocytes,UA Negative Negative   Protein,UA Negative Negative/Trace   Glucose, UA 2+ (A) Negative   Ketones, UA Negative Negative   RBC, UA Negative Negative   Bilirubin, UA Negative Negative   Urobilinogen, Ur 0.2 0.2 - 1.0 mg/dL   Nitrite, UA Negative Negative   Microscopic Examination See below:   Microscopic Examination     Status: None   Collection Time: 07/21/23 10:15 AM   Urine  Result Value Ref Range   WBC, UA 0-5 0 - 5 /hpf   RBC, Urine None seen 0 - 2 /hpf   Epithelial Cells (non renal) None seen 0 - 10 /hpf   Bacteria, UA None seen None seen/Few  CBC     Status: None   Collection Time: 07/21/23 10:18 AM  Result Value Ref Range   WBC 5.9 4.0 - 10.5 K/uL   RBC 4.80 4.22 - 5.81 MIL/uL   Hemoglobin 14.7 13.0 - 17.0 g/dL  HCT 44.0 39.0 - 52.0 %   MCV 91.7 80.0 - 100.0 fL   MCH 30.6 26.0 - 34.0 pg   MCHC 33.4 30.0 - 36.0 g/dL   RDW 03.4 74.2 - 59.5 %   Platelets 169 150 - 400 K/uL   nRBC 0.0 0.0 - 0.2 %    Comment: Performed at [facility], [address] [place], [place] [address]  Surgical pathology     Status: None   Collection Time: 07/28/23 12:00 AM  Result Value Ref Range   SURGICAL PATHOLOGY      SURGICAL PATHOLOGY [facility] [address], [address] [place], [place] [address] Telephone [phone] or [phone] Fax [phone]  REPORT OF SURGICAL PATHOLOGY   Accession #: [id number] Patient Name: Andrew Lyons, Andrew Lyons Visit # : [id number]  MRN: [id number] Physician: Legrand Rams DOB/Age Feb 25, 1946 (Age: 42) Gender: M Collected Date: 07/28/2023 Received Date: 07/28/2023  FINAL DIAGNOSIS       1. Prostate, chips,  :       - BENIGN PROSTATIC GLANDULAR AND STROMAL HYPERPLASIA.      - NEGATIVE FOR MALIGNANCY.       DATE SIGNED OUT: 07/31/2023 ELECTRONIC SIGNATURE : Oneita Kras Md, Delice Bison , Pathologist, Electronic Signature  MICROSCOPIC  DESCRIPTION  CASE COMMENTS STAINS USED IN DIAGNOSIS: H&E H&E H&E H&E H&E H&E H&E    CLINICAL HISTORY  SPECIMEN(S) OBTAINED 1. Prostate, chips,  SPECIMEN COMMENTS: SPECIMEN CLINICAL INFORMATION: 1. Benign prostatic hyperplasia    Gross Description 1. Received fresh and placed in  formalin, labeled "prostate chips", is a 13.1 g, 7.0 x 7.0 x 1.5 cm aggregate of tan-pink rubbery soft tissue fragments. Representative sections are submitted in blocks 1A-1G.      SMB      07/28/2023        Report signed out from the following location(s) [place]. [facility] [address], [place] [address] CLIA #: [id number]  [facility] [address] [place], [place] [address] CLIA #: [id number]   Glucose, capillary     Status: Abnormal   Collection Time: 07/28/23  7:09 AM  Result Value Ref Range   Glucose-Capillary 180 (H) 70 - 99 mg/dL    Comment: Glucose reference range applies only to samples taken after fasting for at least 8 hours.  Glucose, capillary     Status: Abnormal   Collection Time: 07/28/23  8:51 AM  Result Value Ref Range   Glucose-Capillary 193 (H) 70 - 99 mg/dL    Comment: Glucose reference range applies only to samples taken after fasting for at least 8 hours.    Radiology No results found.  Assessment/Plan  PAD (peripheral artery disease) (HCC) ABIs today remain in the normal range on the left side at 1.20 with biphasic waveforms and digital pressure of 106.  No role for intervention.  On antiplatelet and statin agent.  1 year with ABIs.  Diabetes blood glucose control important in reducing the progression of atherosclerotic disease. Also, involved in wound healing. On appropriate medications.     HLD (hyperlipidemia) lipid control important in reducing the progression of atherosclerotic disease. Continue statin therapy   Hx of BKA, right (HCC) Now healed with a prosthesis  Festus Barren, MD  08/15/2023 11:40  AM    This note was created with Dragon medical transcription system.  Any errors from dictation are purely unintentional

## 2023-08-19 ENCOUNTER — Other Ambulatory Visit: Payer: Self-pay | Admitting: Family Medicine

## 2023-08-21 ENCOUNTER — Other Ambulatory Visit: Payer: Self-pay | Admitting: Family Medicine

## 2023-08-21 ENCOUNTER — Telehealth: Payer: Self-pay

## 2023-08-21 DIAGNOSIS — R309 Painful micturition, unspecified: Secondary | ICD-10-CM | POA: Diagnosis not present

## 2023-08-21 DIAGNOSIS — J301 Allergic rhinitis due to pollen: Secondary | ICD-10-CM

## 2023-08-21 NOTE — Telephone Encounter (Signed)
Called pt he states that his dysuria improved after surgery but then worsened. He is 3 weeks post op. Offered pt appt tomorrow w/ Carollee Herter he declines as he was seen by his PCP today. Aplogized to pt that we were not able to schedule him sooner, pt voiced understanding.

## 2023-08-21 NOTE — Telephone Encounter (Signed)
Pt called appt line and left VM-   S/p Holep for BPH with urinary obstruction on  11/1 by [facility].   Pt states he is having burning with urination and thinks he has an infection.   NO fever NO bleeding with urination.   Advised pt that a s/e of Holep is burning with urination.   Will send to CMA.

## 2023-08-21 NOTE — Telephone Encounter (Signed)
Patient walked in and stated when he urinates it burns and he believes it could be an infection and he would like an antibiotic.  Please f/u with patient

## 2023-08-22 LAB — VAS US ABI WITH/WO TBI: Left ABI: 1.2

## 2023-08-23 DIAGNOSIS — R234 Changes in skin texture: Secondary | ICD-10-CM | POA: Diagnosis not present

## 2023-08-23 DIAGNOSIS — K136 Irritative hyperplasia of oral mucosa: Secondary | ICD-10-CM | POA: Diagnosis not present

## 2023-09-25 ENCOUNTER — Other Ambulatory Visit: Payer: Self-pay | Admitting: Family Medicine

## 2023-09-25 DIAGNOSIS — J301 Allergic rhinitis due to pollen: Secondary | ICD-10-CM

## 2023-10-11 DIAGNOSIS — K08109 Complete loss of teeth, unspecified cause, unspecified class: Secondary | ICD-10-CM | POA: Diagnosis not present

## 2023-10-11 DIAGNOSIS — K136 Irritative hyperplasia of oral mucosa: Secondary | ICD-10-CM | POA: Diagnosis not present

## 2023-10-30 DIAGNOSIS — I25118 Atherosclerotic heart disease of native coronary artery with other forms of angina pectoris: Secondary | ICD-10-CM | POA: Diagnosis not present

## 2023-10-30 DIAGNOSIS — R809 Proteinuria, unspecified: Secondary | ICD-10-CM | POA: Diagnosis not present

## 2023-10-30 DIAGNOSIS — E782 Mixed hyperlipidemia: Secondary | ICD-10-CM | POA: Diagnosis not present

## 2023-10-30 DIAGNOSIS — I739 Peripheral vascular disease, unspecified: Secondary | ICD-10-CM | POA: Diagnosis not present

## 2023-10-30 DIAGNOSIS — I6523 Occlusion and stenosis of bilateral carotid arteries: Secondary | ICD-10-CM | POA: Diagnosis not present

## 2023-10-30 DIAGNOSIS — I10 Essential (primary) hypertension: Secondary | ICD-10-CM | POA: Diagnosis not present

## 2023-10-30 DIAGNOSIS — E1129 Type 2 diabetes mellitus with other diabetic kidney complication: Secondary | ICD-10-CM | POA: Diagnosis not present

## 2023-11-01 ENCOUNTER — Ambulatory Visit: Payer: PPO | Admitting: Dermatology

## 2023-11-06 DIAGNOSIS — I6523 Occlusion and stenosis of bilateral carotid arteries: Secondary | ICD-10-CM | POA: Diagnosis not present

## 2023-11-06 DIAGNOSIS — E1129 Type 2 diabetes mellitus with other diabetic kidney complication: Secondary | ICD-10-CM | POA: Diagnosis not present

## 2023-11-06 DIAGNOSIS — E1169 Type 2 diabetes mellitus with other specified complication: Secondary | ICD-10-CM | POA: Diagnosis not present

## 2023-11-06 DIAGNOSIS — I25118 Atherosclerotic heart disease of native coronary artery with other forms of angina pectoris: Secondary | ICD-10-CM | POA: Diagnosis not present

## 2023-11-06 DIAGNOSIS — G4733 Obstructive sleep apnea (adult) (pediatric): Secondary | ICD-10-CM | POA: Diagnosis not present

## 2023-11-06 DIAGNOSIS — E66812 Obesity, class 2: Secondary | ICD-10-CM | POA: Diagnosis not present

## 2023-11-06 DIAGNOSIS — Z89511 Acquired absence of right leg below knee: Secondary | ICD-10-CM | POA: Diagnosis not present

## 2023-11-06 DIAGNOSIS — Z952 Presence of prosthetic heart valve: Secondary | ICD-10-CM | POA: Diagnosis not present

## 2023-11-06 DIAGNOSIS — Z0000 Encounter for general adult medical examination without abnormal findings: Secondary | ICD-10-CM | POA: Diagnosis not present

## 2023-11-06 DIAGNOSIS — I10 Essential (primary) hypertension: Secondary | ICD-10-CM | POA: Diagnosis not present

## 2023-11-06 DIAGNOSIS — R809 Proteinuria, unspecified: Secondary | ICD-10-CM | POA: Diagnosis not present

## 2023-11-06 DIAGNOSIS — I739 Peripheral vascular disease, unspecified: Secondary | ICD-10-CM | POA: Diagnosis not present

## 2023-11-27 ENCOUNTER — Ambulatory Visit: Payer: PPO | Admitting: Dermatology

## 2023-11-27 ENCOUNTER — Encounter: Payer: Self-pay | Admitting: Dermatology

## 2023-11-27 DIAGNOSIS — Z1283 Encounter for screening for malignant neoplasm of skin: Secondary | ICD-10-CM | POA: Diagnosis not present

## 2023-11-27 DIAGNOSIS — L814 Other melanin hyperpigmentation: Secondary | ICD-10-CM | POA: Diagnosis not present

## 2023-11-27 DIAGNOSIS — W908XXA Exposure to other nonionizing radiation, initial encounter: Secondary | ICD-10-CM | POA: Diagnosis not present

## 2023-11-27 DIAGNOSIS — Z86018 Personal history of other benign neoplasm: Secondary | ICD-10-CM | POA: Diagnosis not present

## 2023-11-27 DIAGNOSIS — D229 Melanocytic nevi, unspecified: Secondary | ICD-10-CM

## 2023-11-27 DIAGNOSIS — D492 Neoplasm of unspecified behavior of bone, soft tissue, and skin: Secondary | ICD-10-CM

## 2023-11-27 DIAGNOSIS — L578 Other skin changes due to chronic exposure to nonionizing radiation: Secondary | ICD-10-CM

## 2023-11-27 DIAGNOSIS — D1801 Hemangioma of skin and subcutaneous tissue: Secondary | ICD-10-CM | POA: Diagnosis not present

## 2023-11-27 DIAGNOSIS — L821 Other seborrheic keratosis: Secondary | ICD-10-CM

## 2023-11-27 DIAGNOSIS — B351 Tinea unguium: Secondary | ICD-10-CM | POA: Diagnosis not present

## 2023-11-27 NOTE — Progress Notes (Signed)
 Follow-Up Visit   Subjective  Andrew Lyons is a 78 y.o. male who presents for the following: Skin Cancer Screening and Full Body Skin Exam. Hx of dysplastic nevi.   The patient presents for Total-Body Skin Exam (TBSE) for skin cancer screening and mole check. The patient has spots, moles and lesions to be evaluated, some may be new or changing and the patient may have concern these could be cancer.    The following portions of the chart were reviewed this encounter and updated as appropriate: medications, allergies, medical history  Review of Systems:  No other skin or systemic complaints except as noted in HPI or Assessment and Plan.  Objective  Well appearing patient in no apparent distress; mood and affect are within normal limits.  A full examination was performed including scalp, head, eyes, ears, nose, lips, neck, chest, axillae, abdomen, back, buttocks, bilateral upper extremities, bilateral lower extremities, hands, feet, fingers, toes, fingernails, and toenails. All findings within normal limits unless otherwise noted below.   Relevant physical exam findings are noted in the Assessment and Plan.  Left Dorsal Hand 5 mm irregular pigmented macule   Assessment & Plan   SKIN CANCER SCREENING PERFORMED TODAY.  HISTORY OF DYSPLASTIC NEVI No evidence of recurrence today Recommend regular full body skin exams Recommend daily broad spectrum sunscreen SPF 30+ to sun-exposed areas, reapply every 2 hours as needed.  Call if any new or changing lesions are noted between office visits   ACTINIC DAMAGE - Chronic condition, secondary to cumulative UV/sun exposure - diffuse scaly erythematous macules with underlying dyspigmentation - Recommend daily broad spectrum sunscreen SPF 30+ to sun-exposed areas, reapply every 2 hours as needed.  - Staying in the shade or wearing long sleeves, sun glasses (UVA+UVB protection) and wide brim hats (4-inch brim around the entire  circumference of the hat) are also recommended for sun protection.  - Call for new or changing lesions.  LENTIGINES, SEBORRHEIC KERATOSES, HEMANGIOMAS - Benign normal skin lesions - Benign-appearing - Call for any changes  MELANOCYTIC NEVI - Tan-brown and/or pink-flesh-colored symmetric macules and papules - Benign appearing on exam today - Observation - Call clinic for new or changing moles - Recommend daily use of broad spectrum spf 30+ sunscreen to sun-exposed areas.    ONYCHOMYCOSIS Exam: Thickened toenails of left foot with subungal debris c/w onychomycosis  Treatment Plan: Benign-appearing.  Observation.  Patient deferred treatment at this time.    NEOPLASM OF SKIN Left Dorsal Hand Skin / nail biopsy Type of biopsy: tangential   Informed consent: discussed and consent obtained   Timeout: patient name, date of birth, surgical site, and procedure verified   Procedure prep:  Patient was prepped and draped in usual sterile fashion Prep type:  Isopropyl alcohol Anesthesia: the lesion was anesthetized in a standard fashion   Anesthetic:  1% lidocaine w/ epinephrine 1-100,000 buffered w/ 8.4% NaHCO3 Instrument used: DermaBlade   Hemostasis achieved with: pressure and aluminum chloride   Outcome: patient tolerated procedure well   Post-procedure details: sterile dressing applied and wound care instructions given   Dressing type: bandage and petrolatum   Specimen 1 - Surgical pathology Differential Diagnosis: R/O dysplastic nevus vs melanoma   Check Margins: No MULTIPLE BENIGN NEVI   LENTIGINES   ACTINIC ELASTOSIS   SEBORRHEIC KERATOSES   CHERRY ANGIOMA    Return in about 1 year (around 11/26/2024) for TBSE, HxDN.  I, Lawson Radar, CMA, am acting as scribe for Elie Goody, MD.   Documentation:  I have reviewed the above documentation for accuracy and completeness, and I agree with the above.  Elie Goody, MD

## 2023-11-27 NOTE — Patient Instructions (Signed)
 Wound Care Instructions  Cleanse wound gently with soap and water once a day then pat dry with clean gauze. Apply a thin coat of Petrolatum (petroleum jelly, "Vaseline") over the wound (unless you have an allergy to this). We recommend that you use a new, sterile tube of Vaseline. Do not pick or remove scabs. Do not remove the yellow or white "healing tissue" from the base of the wound.  Cover the wound with fresh, clean, nonstick gauze and secure with paper tape. You may use Band-Aids in place of gauze and tape if the wound is small enough, but would recommend trimming much of the tape off as there is often too much. Sometimes Band-Aids can irritate the skin.  You should call the office for your biopsy report after 1 week if you have not already been contacted.  If you experience any problems, such as abnormal amounts of bleeding, swelling, significant bruising, significant pain, or evidence of infection, please call the office immediately.  FOR ADULT SURGERY PATIENTS: If you need something for pain relief you may take 1 extra strength Tylenol (acetaminophen) AND 2 Ibuprofen (200mg  each) together every 4 hours as needed for pain. (do not take these if you are allergic to them or if you have a reason you should not take them.) Typically, you may only need pain medication for 1 to 3 days.       Recommend daily broad spectrum sunscreen SPF 30+ to sun-exposed areas, reapply every 2 hours as needed. Call for new or changing lesions.  Staying in the shade or wearing long sleeves, sun glasses (UVA+UVB protection) and wide brim hats (4-inch brim around the entire circumference of the hat) are also recommended for sun protection.    Melanoma ABCDEs  Melanoma is the most dangerous type of skin cancer, and is the leading cause of death from skin disease.  You are more likely to develop melanoma if you: Have light-colored skin, light-colored eyes, or red or blond hair Spend a lot of time in the sun Tan  regularly, either outdoors or in a tanning bed Have had blistering sunburns, especially during childhood Have a close family member who has had a melanoma Have atypical moles or large birthmarks  Early detection of melanoma is key since treatment is typically straightforward and cure rates are extremely high if we catch it early.   The first sign of melanoma is often a change in a mole or a new dark spot.  The ABCDE system is a way of remembering the signs of melanoma.  A for asymmetry:  The two halves do not match. B for border:  The edges of the growth are irregular. C for color:  A mixture of colors are present instead of an even brown color. D for diameter:  Melanomas are usually (but not always) greater than 6mm - the size of a pencil eraser. E for evolution:  The spot keeps changing in size, shape, and color.  Please check your skin once per month between visits. You can use a small mirror in front and a large mirror behind you to keep an eye on the back side or your body.   If you see any new or changing lesions before your next follow-up, please call to schedule a visit.  Please continue daily skin protection including broad spectrum sunscreen SPF 30+ to sun-exposed areas, reapplying every 2 hours as needed when you're outdoors.   Staying in the shade or wearing long sleeves, sun glasses (UVA+UVB protection) and  wide brim hats (4-inch brim around the entire circumference of the hat) are also recommended for sun protection.     Due to recent changes in healthcare laws, you may see results of your pathology and/or laboratory studies on MyChart before the doctors have had a chance to review them. We understand that in some cases there may be results that are confusing or concerning to you. Please understand that not all results are received at the same time and often the doctors may need to interpret multiple results in order to provide you with the best plan of care or course of  treatment. Therefore, we ask that you please give [place] 2 business days to thoroughly review all your results before contacting the office for clarification. Should we see a critical lab result, you will be contacted sooner.   If You Need Anything After Your Visit  If you have any questions or concerns for your doctor, please call our main line at [phone] and press option 4 to reach your doctor's medical assistant. If no one answers, please leave a voicemail as directed and we will return your call as soon as possible. Messages left after 4 pm will be answered the following business day.   You may also send [place] a message via MyChart. We typically respond to MyChart messages within 1-2 business days.  For prescription refills, please ask your pharmacy to contact our office. Our fax number is [phone].  If you have an urgent issue when the clinic is closed that cannot wait until the next business day, you can page your doctor at the number below.    Please note that while we do our best to be available for urgent issues outside of office hours, we are not available 24/7.   If you have an urgent issue and are unable to reach [place], you may choose to seek medical care at your doctor's office, retail clinic, urgent care center, or emergency room.  If you have a medical emergency, please immediately call 911 or go to the emergency department.  Pager Numbers  - Dr. Gwen Pounds: [phone]  - Dr. Roseanne Reno: [phone]  - Dr. Katrinka Blazing: [phone]   In the event of inclement weather, please call our main line at [phone] for an update on the status of any delays or closures.  Dermatology Medication Tips: Please keep the boxes that topical medications come in in order to help keep track of the instructions about where and how to use these. Pharmacies typically print the medication instructions only on the boxes and not directly on the medication tubes.   If your medication is too expensive,  please contact our office at [phone] option 4 or send [place] a message through MyChart.   We are unable to tell what your co-pay for medications will be in advance as this is different depending on your insurance coverage. However, we may be able to find a substitute medication at lower cost or fill out paperwork to get insurance to cover a needed medication.   If a prior authorization is required to get your medication covered by your insurance company, please allow [place] 1-2 business days to complete this process.  Drug prices often vary depending on where the prescription is filled and some pharmacies may offer cheaper prices.  The website www.goodrx.com contains coupons for medications through different pharmacies. The prices here do not account for what the cost may be with help from insurance (it may be cheaper with your insurance), but the website  can give you the price if you did not use any insurance.  - You can print the associated coupon and take it with your prescription to the pharmacy.  - You may also stop by our office during regular business hours and pick up a GoodRx coupon card.  - If you need your prescription sent electronically to a different pharmacy, notify our office through [facility] or by phone at [phone] option 4.     Si Usted Necesita Algo Despus de Su Visita  Tambin puede enviarnos un mensaje a travs de Clinical cytogeneticist. Por lo general respondemos a los mensajes de MyChart en el transcurso de 1 a 2 das hbiles.  Para renovar recetas, por favor pida a su farmacia que se ponga en contacto con nuestra oficina. Annie Sable de fax es [place] [phone].  Si tiene un asunto urgente cuando la clnica est cerrada y que no puede esperar hasta el siguiente da hbil, puede llamar/localizar a su doctor(a) al nmero que aparece a continuacin.   Por favor, tenga en cuenta que aunque hacemos todo lo posible para estar disponibles para asuntos urgentes fuera del  horario de [employer], no estamos disponibles las 24 horas del da, los 7 [address]  [address] de la [place].   Si tiene un problema urgente y no puede comunicarse con nosotros, puede optar por buscar atencin mdica  en el consultorio de su doctor(a), en una clnica privada, en un centro de atencin urgente o en una sala de emergencias.  Si tiene Engineer, drilling, por favor llame inmediatamente al 911 o vaya a la sala de emergencias.  Nmeros de bper  - Dr. Gwen Pounds: [phone]  - Dra. Roseanne Reno: [phone]  - Dr. Katrinka Blazing: [phone]   En caso de inclemencias del tiempo, por favor llame a Lacy Duverney principal al [phone] para una actualizacin sobre el [place] de cualquier retraso o cierre.  Consejos para la medicacin en dermatologa: Por favor, guarde las cajas en las que vienen los medicamentos de uso tpico para ayudarle a seguir las instrucciones sobre dnde y cmo usarlos. Las farmacias generalmente imprimen las instrucciones del medicamento slo en las cajas y no directamente en los tubos del [place].   Si su medicamento es muy caro, por favor, pngase en contacto con Rolm Gala llamando al [phone] y presione la opcin 4 o envenos un mensaje a travs de Clinical cytogeneticist.   No podemos decirle cul ser su copago por los medicamentos por adelantado ya que esto es diferente dependiendo de la cobertura de su seguro. Sin embargo, es posible que podamos encontrar un medicamento sustituto a Audiological scientist un formulario para que el seguro cubra el medicamento que se considera necesario.   Si se requiere una autorizacin previa para que su compaa de seguros [place] su medicamento, por favor permtanos de 1 a 2 das hbiles para completar [address]  Los precios de los medicamentos varan con frecuencia dependiendo del Environmental consultant de dnde se surte la receta y alguna farmacias pueden ofrecer precios ms baratos.  El sitio web www.goodrx.com tiene cupones para medicamentos de Engineer, civil (consulting). Los precios aqu no tienen en cuenta lo que podra costar con la ayuda del seguro (puede ser ms barato con su seguro), pero el sitio web puede darle el precio si no utiliz Tourist information centre manager.  - Puede imprimir el cupn correspondiente y llevarlo con su receta a la farmacia.  - Tambin puede pasar por nuestra oficina durante el horario de atencin regular y Education officer, museum una tarjeta de cupones de GoodRx.  -  Si necesita que su receta se enve electrnicamente a Psychiatrist, informe a nuestra oficina a travs de MyChart de [place] o por telfono llamando al [phone] y presione la opcin 4.

## 2023-11-28 ENCOUNTER — Encounter: Payer: Self-pay | Admitting: Dermatology

## 2023-11-28 LAB — SURGICAL PATHOLOGY

## 2023-11-29 ENCOUNTER — Other Ambulatory Visit: Payer: Self-pay

## 2023-11-29 DIAGNOSIS — N138 Other obstructive and reflux uropathy: Secondary | ICD-10-CM

## 2023-11-30 ENCOUNTER — Ambulatory Visit: Payer: PPO | Admitting: Urology

## 2023-11-30 VITALS — BP 146/69 | HR 74 | Ht 70.0 in | Wt 240.0 lb

## 2023-11-30 DIAGNOSIS — N138 Other obstructive and reflux uropathy: Secondary | ICD-10-CM

## 2023-11-30 DIAGNOSIS — N401 Enlarged prostate with lower urinary tract symptoms: Secondary | ICD-10-CM | POA: Diagnosis not present

## 2023-11-30 DIAGNOSIS — R351 Nocturia: Secondary | ICD-10-CM

## 2023-11-30 LAB — BLADDER SCAN AMB NON-IMAGING

## 2023-11-30 NOTE — Progress Notes (Signed)
   11/30/2023 12:23 PM   Andrew Lyons [birth year] [id number]  Reason for visit: Follow up BPH/atonic bladder status post HOLEP  HPI: 78 year old male previously followed by Dr. Sheppard Penton as well as Dr. Lonna Cobb, history of UroLift as well as greenlight laser PVP by Dr. Sheppard Penton, neither of which improved his urinary symptoms.  Primary complaint was nocturia every hour overnight and weak stream, frequency during the day.  He underwent extensive evaluation with Dr. Lonna Cobb with cystoscopy showing distal prostatic obstructive appearing abdomen volume was 32 g, urodynamics in [facility] showed bladder capacity , first sensation , no instability, voluntary contraction voiding with a peak flow rate of 42ml/s with detrusor pressure of only 9 cm of water consistent with atonic bladder, and PVR was elevated at .  He also uses CPAP for sleep apnea.  He underwent a HOLEP on 07/28/2023 with removal of multiple prior UroLift clips, and 13 g benign prostate tissue.  Overall he is doing very well since surgery, and symptoms have improved significantly.  He really denies any significant urinary complaints during the day, persistent nocturia 3 times overnight.  We reviewed behavioral strategies regarding nocturia, he does have a number of other medical issues including obesity, sleep apnea, diabetes, hypertension that likely contribute.  PVR today is normal at 76ml.  RTC 6 months symptom check, behavioral strategies discussed regarding nocturia, need to have realistic expectations with his other medical issues  Sondra Come, MD  [facility] [address] [place], [place] [address] [phone]

## 2023-11-30 NOTE — Patient Instructions (Signed)

## 2024-01-08 DIAGNOSIS — H43812 Vitreous degeneration, left eye: Secondary | ICD-10-CM | POA: Diagnosis not present

## 2024-01-08 DIAGNOSIS — E119 Type 2 diabetes mellitus without complications: Secondary | ICD-10-CM | POA: Diagnosis not present

## 2024-02-13 ENCOUNTER — Encounter (INDEPENDENT_AMBULATORY_CARE_PROVIDER_SITE_OTHER): Payer: Self-pay

## 2024-02-27 DIAGNOSIS — I10 Essential (primary) hypertension: Secondary | ICD-10-CM | POA: Diagnosis not present

## 2024-02-27 DIAGNOSIS — I25118 Atherosclerotic heart disease of native coronary artery with other forms of angina pectoris: Secondary | ICD-10-CM | POA: Diagnosis not present

## 2024-02-27 DIAGNOSIS — Z952 Presence of prosthetic heart valve: Secondary | ICD-10-CM | POA: Diagnosis not present

## 2024-02-27 DIAGNOSIS — R0602 Shortness of breath: Secondary | ICD-10-CM | POA: Diagnosis not present

## 2024-02-27 DIAGNOSIS — I739 Peripheral vascular disease, unspecified: Secondary | ICD-10-CM | POA: Diagnosis not present

## 2024-02-27 DIAGNOSIS — I2510 Atherosclerotic heart disease of native coronary artery without angina pectoris: Secondary | ICD-10-CM | POA: Diagnosis not present

## 2024-02-27 DIAGNOSIS — R809 Proteinuria, unspecified: Secondary | ICD-10-CM | POA: Diagnosis not present

## 2024-02-27 DIAGNOSIS — I6523 Occlusion and stenosis of bilateral carotid arteries: Secondary | ICD-10-CM | POA: Diagnosis not present

## 2024-02-27 DIAGNOSIS — I2584 Coronary atherosclerosis due to calcified coronary lesion: Secondary | ICD-10-CM | POA: Diagnosis not present

## 2024-02-27 DIAGNOSIS — G4733 Obstructive sleep apnea (adult) (pediatric): Secondary | ICD-10-CM | POA: Diagnosis not present

## 2024-02-27 DIAGNOSIS — E1169 Type 2 diabetes mellitus with other specified complication: Secondary | ICD-10-CM | POA: Diagnosis not present

## 2024-02-27 DIAGNOSIS — E782 Mixed hyperlipidemia: Secondary | ICD-10-CM | POA: Diagnosis not present

## 2024-02-27 DIAGNOSIS — E1129 Type 2 diabetes mellitus with other diabetic kidney complication: Secondary | ICD-10-CM | POA: Diagnosis not present

## 2024-02-27 DIAGNOSIS — Z79899 Other long term (current) drug therapy: Secondary | ICD-10-CM | POA: Diagnosis not present

## 2024-02-27 DIAGNOSIS — R0609 Other forms of dyspnea: Secondary | ICD-10-CM | POA: Diagnosis not present

## 2024-02-27 DIAGNOSIS — Z0000 Encounter for general adult medical examination without abnormal findings: Secondary | ICD-10-CM | POA: Diagnosis not present

## 2024-03-11 ENCOUNTER — Ambulatory Visit
Admission: RE | Admit: 2024-03-11 | Discharge: 2024-03-11 | Disposition: A | Discharge status: Home / Self Care | Attending: Internal Medicine | Admitting: Internal Medicine

## 2024-03-11 ENCOUNTER — Encounter
Admission: RE | Disposition: A | Discharge status: Home / Self Care | Payer: Self-pay | Source: Home / Self Care | Attending: Internal Medicine

## 2024-03-11 ENCOUNTER — Other Ambulatory Visit: Payer: Self-pay

## 2024-03-11 DIAGNOSIS — Z953 Presence of xenogenic heart valve: Secondary | ICD-10-CM | POA: Diagnosis not present

## 2024-03-11 DIAGNOSIS — R9430 Abnormal result of cardiovascular function study, unspecified: Secondary | ICD-10-CM | POA: Diagnosis not present

## 2024-03-11 DIAGNOSIS — Z87891 Personal history of nicotine dependence: Secondary | ICD-10-CM | POA: Diagnosis not present

## 2024-03-11 DIAGNOSIS — R0600 Dyspnea, unspecified: Secondary | ICD-10-CM | POA: Diagnosis not present

## 2024-03-11 DIAGNOSIS — Z7985 Long-term (current) use of injectable non-insulin antidiabetic drugs: Secondary | ICD-10-CM | POA: Insufficient documentation

## 2024-03-11 DIAGNOSIS — I2510 Atherosclerotic heart disease of native coronary artery without angina pectoris: Secondary | ICD-10-CM | POA: Insufficient documentation

## 2024-03-11 DIAGNOSIS — E119 Type 2 diabetes mellitus without complications: Secondary | ICD-10-CM | POA: Diagnosis not present

## 2024-03-11 DIAGNOSIS — E785 Hyperlipidemia, unspecified: Secondary | ICD-10-CM | POA: Insufficient documentation

## 2024-03-11 DIAGNOSIS — Z7984 Long term (current) use of oral hypoglycemic drugs: Secondary | ICD-10-CM | POA: Insufficient documentation

## 2024-03-11 DIAGNOSIS — I10 Essential (primary) hypertension: Secondary | ICD-10-CM | POA: Diagnosis not present

## 2024-03-11 DIAGNOSIS — Q2381 Bicuspid aortic valve: Secondary | ICD-10-CM

## 2024-03-11 DIAGNOSIS — G4733 Obstructive sleep apnea (adult) (pediatric): Secondary | ICD-10-CM | POA: Insufficient documentation

## 2024-03-11 DIAGNOSIS — I350 Nonrheumatic aortic (valve) stenosis: Secondary | ICD-10-CM | POA: Diagnosis not present

## 2024-03-11 HISTORY — PX: RIGHT/LEFT HEART CATH AND CORONARY ANGIOGRAPHY: CATH118266

## 2024-03-11 LAB — POCT I-STAT EG7
Acid-base deficit: 2 mmol/L (ref 0.0–2.0)
Bicarbonate: 23.8 mmol/L (ref 20.0–28.0)
Calcium, Ion: 1.24 mmol/L (ref 1.15–1.40)
HCT: 38 % — ABNORMAL LOW (ref 39.0–52.0)
Hemoglobin: 12.9 g/dL — ABNORMAL LOW (ref 13.0–17.0)
O2 Saturation: 60 %
Potassium: 4.1 mmol/L (ref 3.5–5.1)
Sodium: 142 mmol/L (ref 135–145)
TCO2: 25 mmol/L (ref 22–32)
pCO2, Ven: 43.4 mmHg — ABNORMAL LOW (ref 44–60)
pH, Ven: 7.347 (ref 7.25–7.43)
pO2, Ven: 33 mmHg (ref 32–45)

## 2024-03-11 LAB — GLUCOSE, CAPILLARY: Glucose-Capillary: 159 mg/dL — ABNORMAL HIGH (ref 70–99)

## 2024-03-11 SURGERY — RIGHT/LEFT HEART CATH AND CORONARY ANGIOGRAPHY
Anesthesia: Moderate Sedation | Laterality: Bilateral

## 2024-03-11 MED ORDER — SODIUM CHLORIDE 0.9% FLUSH: 3.0000 mL | Freq: Two times a day (BID) | INTRAVENOUS | Status: DC

## 2024-03-11 MED ORDER — LIDOCAINE HCL (PF) 1 % IJ SOLN
INTRAMUSCULAR | Status: DC | PRN
  Administered 2024-03-11: 5 mL

## 2024-03-11 MED ORDER — FUROSEMIDE 10 MG/ML IJ SOLN
40.0000 mg | Freq: Once | INTRAMUSCULAR | Status: AC
  Administered 2024-03-11: 40 mg via INTRAVENOUS

## 2024-03-11 MED ORDER — SODIUM CHLORIDE 0.9 % IV SOLN: 250.0000 mL | INTRAVENOUS | Status: DC | PRN

## 2024-03-11 MED ORDER — HEPARIN SODIUM (PORCINE) 1000 UNIT/ML IJ SOLN
INTRAMUSCULAR | Status: DC | PRN
Start: 2024-03-11 — End: 2024-03-11
  Administered 2024-03-11: 4000 [IU] via INTRAVENOUS

## 2024-03-11 MED ORDER — VERAPAMIL HCL 2.5 MG/ML IV SOLN
INTRAVENOUS | Status: DC | PRN
Start: 2024-03-11 — End: 2024-03-11
  Administered 2024-03-11: 2.5 mg via INTRAVENOUS

## 2024-03-11 MED ORDER — METHYLPREDNISOLONE SODIUM SUCC 125 MG IJ SOLR
125.0000 mg | Freq: Once | INTRAMUSCULAR | Status: AC
  Administered 2024-03-11: 125 mg via INTRAVENOUS

## 2024-03-11 MED ORDER — HEPARIN (PORCINE) IN NACL 2000-0.9 UNIT/L-% IV SOLN
INTRAVENOUS | Status: DC | PRN
  Administered 2024-03-11: 1000 mL

## 2024-03-11 MED ORDER — MIDAZOLAM HCL 2 MG/2ML IJ SOLN
INTRAMUSCULAR | Status: DC | PRN
Start: 2024-03-11 — End: 2024-03-11
  Administered 2024-03-11: 1 mg via INTRAVENOUS

## 2024-03-11 MED ORDER — DIPHENHYDRAMINE HCL 50 MG/ML IJ SOLN
INTRAMUSCULAR | Status: AC
  Filled 2024-03-11: qty 1

## 2024-03-11 MED ORDER — ONDANSETRON HCL 4 MG/2ML IJ SOLN: 4.0000 mg | Freq: Four times a day (QID) | INTRAMUSCULAR | Status: DC | PRN

## 2024-03-11 MED ORDER — HEPARIN (PORCINE) IN NACL 1000-0.9 UT/500ML-% IV SOLN
INTRAVENOUS | Status: AC
Start: 2024-03-11 — End: 2024-03-11
  Filled 2024-03-11: qty 1000

## 2024-03-11 MED ORDER — SODIUM CHLORIDE 0.9 % WEIGHT BASED INFUSION
3.0000 mL/kg/h | INTRAVENOUS | Status: AC
Start: 2024-03-11 — End: 2024-03-11
  Administered 2024-03-11: 3 mL/kg/h via INTRAVENOUS

## 2024-03-11 MED ORDER — SODIUM CHLORIDE 0.9 % WEIGHT BASED INFUSION: 1.0000 mL/kg/h | INTRAVENOUS | Status: DC

## 2024-03-11 MED ORDER — VERAPAMIL HCL 2.5 MG/ML IV SOLN
INTRAVENOUS | Status: AC
  Filled 2024-03-11: qty 2

## 2024-03-11 MED ORDER — FUROSEMIDE 10 MG/ML IJ SOLN
INTRAMUSCULAR | Status: AC
  Filled 2024-03-11: qty 4

## 2024-03-11 MED ORDER — IOHEXOL 300 MG/ML  SOLN
INTRAMUSCULAR | Status: DC | PRN
  Administered 2024-03-11: 134 mL

## 2024-03-11 MED ORDER — FENTANYL CITRATE (PF) 100 MCG/2ML IJ SOLN
INTRAMUSCULAR | Status: AC
Start: 2024-03-11 — End: 2024-03-11
  Filled 2024-03-11: qty 2

## 2024-03-11 MED ORDER — FENTANYL CITRATE (PF) 100 MCG/2ML IJ SOLN
INTRAMUSCULAR | Status: DC | PRN
Start: 2024-03-11 — End: 2024-03-11
  Administered 2024-03-11: 25 ug via INTRAVENOUS

## 2024-03-11 MED ORDER — LIDOCAINE HCL 1 % IJ SOLN
INTRAMUSCULAR | Status: AC
  Filled 2024-03-11: qty 20

## 2024-03-11 MED ORDER — METHYLPREDNISOLONE SODIUM SUCC 125 MG IJ SOLR
INTRAMUSCULAR | Status: AC
  Filled 2024-03-11: qty 2

## 2024-03-11 MED ORDER — ASPIRIN 81 MG PO CHEW
CHEWABLE_TABLET | ORAL | Status: AC
Start: 2024-03-11 — End: 2024-03-11
  Filled 2024-03-11: qty 1

## 2024-03-11 MED ORDER — FAMOTIDINE IN NACL 20-0.9 MG/50ML-% IV SOLN
20.0000 mg | Freq: Once | INTRAVENOUS | Status: AC
  Administered 2024-03-11: 20 mg via INTRAVENOUS
  Filled 2024-03-11: qty 50

## 2024-03-11 MED ORDER — DIPHENHYDRAMINE HCL 50 MG/ML IJ SOLN
50.0000 mg | Freq: Once | INTRAMUSCULAR | Status: AC
  Administered 2024-03-11: 50 mg via INTRAVENOUS

## 2024-03-11 MED ORDER — ACETAMINOPHEN 325 MG PO TABS: 650.0000 mg | ORAL_TABLET | ORAL | Status: DC | PRN

## 2024-03-11 MED ORDER — SODIUM CHLORIDE 0.9 % WEIGHT BASED INFUSION
1.0000 mL/kg/h | INTRAVENOUS | Status: DC
  Administered 2024-03-11: 1 mL/kg/h via INTRAVENOUS

## 2024-03-11 MED ORDER — SODIUM CHLORIDE 0.9% FLUSH: 3.0000 mL | INTRAVENOUS | Status: DC | PRN

## 2024-03-11 MED ORDER — MIDAZOLAM HCL 2 MG/2ML IJ SOLN
INTRAMUSCULAR | Status: AC
Start: 2024-03-11 — End: 2024-03-11
  Filled 2024-03-11: qty 2

## 2024-03-11 MED ORDER — HEPARIN SODIUM (PORCINE) 1000 UNIT/ML IJ SOLN
INTRAMUSCULAR | Status: AC
  Filled 2024-03-11: qty 10

## 2024-03-11 MED ORDER — ASPIRIN 81 MG PO CHEW
81.0000 mg | CHEWABLE_TABLET | ORAL | Status: AC
  Administered 2024-03-11: 81 mg via ORAL

## 2024-03-11 SURGICAL SUPPLY — 11 items
CATH BALLN WEDGE 5F 110CM (CATHETERS) IMPLANT
CATH INFINITI 5 FR JL3.5 (CATHETERS) IMPLANT
CATH INFINITI JR4 5F (CATHETERS) IMPLANT
DEVICE RAD TR BAND REGULAR (VASCULAR PRODUCTS) IMPLANT
DRAPE BRACHIAL (DRAPES) IMPLANT
GLIDESHEATH SLEND SS 6F .021 (SHEATH) IMPLANT
GUIDEWIRE INQWIRE 1.5J.035X260 (WIRE) IMPLANT
PACK CARDIAC CATH (CUSTOM PROCEDURE TRAY) ×1 IMPLANT
SET ATX-X65L (MISCELLANEOUS) IMPLANT
SHEATH GLIDE SLENDER 4/5FR (SHEATH) IMPLANT
STATION PROTECTION PRESSURIZED (MISCELLANEOUS) IMPLANT

## 2024-03-12 DIAGNOSIS — I10 Essential (primary) hypertension: Secondary | ICD-10-CM | POA: Diagnosis not present

## 2024-03-12 DIAGNOSIS — Z952 Presence of prosthetic heart valve: Secondary | ICD-10-CM | POA: Diagnosis not present

## 2024-03-12 DIAGNOSIS — I350 Nonrheumatic aortic (valve) stenosis: Secondary | ICD-10-CM | POA: Diagnosis not present

## 2024-03-12 DIAGNOSIS — I2584 Coronary atherosclerosis due to calcified coronary lesion: Secondary | ICD-10-CM | POA: Diagnosis not present

## 2024-03-12 DIAGNOSIS — I2510 Atherosclerotic heart disease of native coronary artery without angina pectoris: Secondary | ICD-10-CM | POA: Diagnosis not present

## 2024-03-12 LAB — POCT I-STAT 7, (LYTES, BLD GAS, ICA,H+H)
Acid-base deficit: 4 mmol/L — ABNORMAL HIGH (ref 0.0–2.0)
Bicarbonate: 20.9 mmol/L (ref 20.0–28.0)
Calcium, Ion: 1.21 mmol/L (ref 1.15–1.40)
HCT: 36 % — ABNORMAL LOW (ref 39.0–52.0)
Hemoglobin: 12.2 g/dL — ABNORMAL LOW (ref 13.0–17.0)
O2 Saturation: 91 %
Potassium: 3.8 mmol/L (ref 3.5–5.1)
Sodium: 139 mmol/L (ref 135–145)
TCO2: 22 mmol/L (ref 22–32)
pCO2 arterial: 37.2 mmHg (ref 32–48)
pH, Arterial: 7.358 (ref 7.35–7.45)
pO2, Arterial: 63 mmHg — ABNORMAL LOW (ref 83–108)

## 2024-03-13 ENCOUNTER — Encounter: Payer: Self-pay | Admitting: Internal Medicine

## 2024-03-13 DIAGNOSIS — Z952 Presence of prosthetic heart valve: Secondary | ICD-10-CM | POA: Diagnosis not present

## 2024-03-13 DIAGNOSIS — I350 Nonrheumatic aortic (valve) stenosis: Secondary | ICD-10-CM | POA: Diagnosis not present

## 2024-03-13 DIAGNOSIS — I10 Essential (primary) hypertension: Secondary | ICD-10-CM | POA: Diagnosis not present

## 2024-03-13 DIAGNOSIS — I739 Peripheral vascular disease, unspecified: Secondary | ICD-10-CM | POA: Diagnosis not present

## 2024-03-13 DIAGNOSIS — Q2381 Bicuspid aortic valve: Secondary | ICD-10-CM | POA: Diagnosis not present

## 2024-03-13 DIAGNOSIS — I25118 Atherosclerotic heart disease of native coronary artery with other forms of angina pectoris: Secondary | ICD-10-CM | POA: Diagnosis not present

## 2024-03-13 LAB — CARDIAC CATHETERIZATION: Cath EF Quantitative: 40 %

## 2024-03-27 DIAGNOSIS — I350 Nonrheumatic aortic (valve) stenosis: Secondary | ICD-10-CM | POA: Diagnosis not present

## 2024-03-27 DIAGNOSIS — Z953 Presence of xenogenic heart valve: Secondary | ICD-10-CM | POA: Diagnosis not present

## 2024-03-27 DIAGNOSIS — Q2381 Bicuspid aortic valve: Secondary | ICD-10-CM | POA: Diagnosis not present

## 2024-03-27 DIAGNOSIS — J811 Chronic pulmonary edema: Secondary | ICD-10-CM | POA: Diagnosis not present

## 2024-03-27 DIAGNOSIS — Z01810 Encounter for preprocedural cardiovascular examination: Secondary | ICD-10-CM | POA: Diagnosis not present

## 2024-03-27 DIAGNOSIS — Z952 Presence of prosthetic heart valve: Secondary | ICD-10-CM | POA: Diagnosis not present

## 2024-03-27 DIAGNOSIS — J90 Pleural effusion, not elsewhere classified: Secondary | ICD-10-CM | POA: Diagnosis not present

## 2024-04-09 DIAGNOSIS — G4733 Obstructive sleep apnea (adult) (pediatric): Secondary | ICD-10-CM | POA: Diagnosis not present

## 2024-04-09 DIAGNOSIS — Z952 Presence of prosthetic heart valve: Secondary | ICD-10-CM | POA: Diagnosis not present

## 2024-04-09 DIAGNOSIS — E1151 Type 2 diabetes mellitus with diabetic peripheral angiopathy without gangrene: Secondary | ICD-10-CM | POA: Diagnosis not present

## 2024-04-09 DIAGNOSIS — Z87891 Personal history of nicotine dependence: Secondary | ICD-10-CM | POA: Diagnosis not present

## 2024-04-09 DIAGNOSIS — E785 Hyperlipidemia, unspecified: Secondary | ICD-10-CM | POA: Diagnosis not present

## 2024-04-09 DIAGNOSIS — Z9989 Dependence on other enabling machines and devices: Secondary | ICD-10-CM | POA: Diagnosis not present

## 2024-04-09 DIAGNOSIS — I25118 Atherosclerotic heart disease of native coronary artery with other forms of angina pectoris: Secondary | ICD-10-CM | POA: Diagnosis not present

## 2024-04-09 DIAGNOSIS — E119 Type 2 diabetes mellitus without complications: Secondary | ICD-10-CM | POA: Diagnosis not present

## 2024-04-09 DIAGNOSIS — I7389 Other specified peripheral vascular diseases: Secondary | ICD-10-CM | POA: Diagnosis not present

## 2024-04-09 DIAGNOSIS — I10 Essential (primary) hypertension: Secondary | ICD-10-CM | POA: Diagnosis not present

## 2024-04-10 ENCOUNTER — Encounter: Payer: Self-pay | Admitting: Urology

## 2024-04-10 DIAGNOSIS — Z0389 Encounter for observation for other suspected diseases and conditions ruled out: Secondary | ICD-10-CM | POA: Diagnosis not present

## 2024-04-10 DIAGNOSIS — Z9861 Coronary angioplasty status: Secondary | ICD-10-CM | POA: Diagnosis not present

## 2024-04-24 DIAGNOSIS — R918 Other nonspecific abnormal finding of lung field: Secondary | ICD-10-CM | POA: Diagnosis not present

## 2024-04-24 DIAGNOSIS — I25118 Atherosclerotic heart disease of native coronary artery with other forms of angina pectoris: Secondary | ICD-10-CM | POA: Diagnosis not present

## 2024-04-24 DIAGNOSIS — I10 Essential (primary) hypertension: Secondary | ICD-10-CM | POA: Diagnosis not present

## 2024-04-24 DIAGNOSIS — D696 Thrombocytopenia, unspecified: Secondary | ICD-10-CM | POA: Diagnosis not present

## 2024-04-24 DIAGNOSIS — Q2381 Bicuspid aortic valve: Secondary | ICD-10-CM | POA: Diagnosis not present

## 2024-04-24 DIAGNOSIS — G4733 Obstructive sleep apnea (adult) (pediatric): Secondary | ICD-10-CM | POA: Diagnosis not present

## 2024-04-24 DIAGNOSIS — I739 Peripheral vascular disease, unspecified: Secondary | ICD-10-CM | POA: Diagnosis not present

## 2024-04-24 DIAGNOSIS — Z01818 Encounter for other preprocedural examination: Secondary | ICD-10-CM | POA: Diagnosis not present

## 2024-04-24 DIAGNOSIS — I350 Nonrheumatic aortic (valve) stenosis: Secondary | ICD-10-CM | POA: Diagnosis not present

## 2024-04-24 DIAGNOSIS — J90 Pleural effusion, not elsewhere classified: Secondary | ICD-10-CM | POA: Diagnosis not present

## 2024-04-24 DIAGNOSIS — R7303 Prediabetes: Secondary | ICD-10-CM | POA: Diagnosis not present

## 2024-04-24 DIAGNOSIS — Z95820 Peripheral vascular angioplasty status with implants and grafts: Secondary | ICD-10-CM | POA: Diagnosis not present

## 2024-04-24 DIAGNOSIS — E782 Mixed hyperlipidemia: Secondary | ICD-10-CM | POA: Diagnosis not present

## 2024-04-24 DIAGNOSIS — Z952 Presence of prosthetic heart valve: Secondary | ICD-10-CM | POA: Diagnosis not present

## 2024-04-25 DIAGNOSIS — Z452 Encounter for adjustment and management of vascular access device: Secondary | ICD-10-CM | POA: Diagnosis not present

## 2024-04-25 DIAGNOSIS — T82857A Stenosis of cardiac prosthetic devices, implants and grafts, initial encounter: Secondary | ICD-10-CM | POA: Diagnosis not present

## 2024-04-25 DIAGNOSIS — Z79899 Other long term (current) drug therapy: Secondary | ICD-10-CM | POA: Diagnosis not present

## 2024-04-25 DIAGNOSIS — Z7902 Long term (current) use of antithrombotics/antiplatelets: Secondary | ICD-10-CM | POA: Diagnosis not present

## 2024-04-25 DIAGNOSIS — Z87891 Personal history of nicotine dependence: Secondary | ICD-10-CM | POA: Diagnosis not present

## 2024-04-25 DIAGNOSIS — K2270 Barrett's esophagus without dysplasia: Secondary | ICD-10-CM | POA: Diagnosis not present

## 2024-04-25 DIAGNOSIS — E782 Mixed hyperlipidemia: Secondary | ICD-10-CM | POA: Diagnosis not present

## 2024-04-25 DIAGNOSIS — Z888 Allergy status to other drugs, medicaments and biological substances status: Secondary | ICD-10-CM | POA: Diagnosis not present

## 2024-04-25 DIAGNOSIS — Z7982 Long term (current) use of aspirin: Secondary | ICD-10-CM | POA: Diagnosis not present

## 2024-04-25 DIAGNOSIS — I25119 Atherosclerotic heart disease of native coronary artery with unspecified angina pectoris: Secondary | ICD-10-CM | POA: Diagnosis not present

## 2024-04-25 DIAGNOSIS — Z7985 Long-term (current) use of injectable non-insulin antidiabetic drugs: Secondary | ICD-10-CM | POA: Diagnosis not present

## 2024-04-25 DIAGNOSIS — Z955 Presence of coronary angioplasty implant and graft: Secondary | ICD-10-CM | POA: Diagnosis not present

## 2024-04-25 DIAGNOSIS — I10 Essential (primary) hypertension: Secondary | ICD-10-CM | POA: Diagnosis not present

## 2024-04-25 DIAGNOSIS — I350 Nonrheumatic aortic (valve) stenosis: Secondary | ICD-10-CM | POA: Diagnosis not present

## 2024-04-25 DIAGNOSIS — K219 Gastro-esophageal reflux disease without esophagitis: Secondary | ICD-10-CM | POA: Diagnosis not present

## 2024-04-25 DIAGNOSIS — I739 Peripheral vascular disease, unspecified: Secondary | ICD-10-CM | POA: Diagnosis not present

## 2024-04-25 DIAGNOSIS — G4733 Obstructive sleep apnea (adult) (pediatric): Secondary | ICD-10-CM | POA: Diagnosis not present

## 2024-04-25 DIAGNOSIS — E119 Type 2 diabetes mellitus without complications: Secondary | ICD-10-CM | POA: Diagnosis not present

## 2024-04-25 DIAGNOSIS — E1169 Type 2 diabetes mellitus with other specified complication: Secondary | ICD-10-CM | POA: Diagnosis not present

## 2024-04-25 DIAGNOSIS — Q2381 Bicuspid aortic valve: Secondary | ICD-10-CM | POA: Diagnosis not present

## 2024-04-25 DIAGNOSIS — Z89511 Acquired absence of right leg below knee: Secondary | ICD-10-CM | POA: Diagnosis not present

## 2024-04-25 DIAGNOSIS — Z91041 Radiographic dye allergy status: Secondary | ICD-10-CM | POA: Diagnosis not present

## 2024-04-25 DIAGNOSIS — Z7984 Long term (current) use of oral hypoglycemic drugs: Secondary | ICD-10-CM | POA: Diagnosis not present

## 2024-04-25 DIAGNOSIS — Z006 Encounter for examination for normal comparison and control in clinical research program: Secondary | ICD-10-CM | POA: Diagnosis not present

## 2024-04-25 DIAGNOSIS — I6523 Occlusion and stenosis of bilateral carotid arteries: Secondary | ICD-10-CM | POA: Diagnosis not present

## 2024-04-25 DIAGNOSIS — Z953 Presence of xenogenic heart valve: Secondary | ICD-10-CM | POA: Diagnosis not present

## 2024-04-25 DIAGNOSIS — I252 Old myocardial infarction: Secondary | ICD-10-CM | POA: Diagnosis not present

## 2024-04-25 DIAGNOSIS — Z4682 Encounter for fitting and adjustment of non-vascular catheter: Secondary | ICD-10-CM | POA: Diagnosis not present

## 2024-04-25 DIAGNOSIS — Z8616 Personal history of COVID-19: Secondary | ICD-10-CM | POA: Diagnosis not present

## 2024-04-25 DIAGNOSIS — I2510 Atherosclerotic heart disease of native coronary artery without angina pectoris: Secondary | ICD-10-CM | POA: Diagnosis not present

## 2024-04-29 ENCOUNTER — Other Ambulatory Visit: Payer: Self-pay

## 2024-04-29 ENCOUNTER — Encounter: Attending: Cardiology

## 2024-04-29 DIAGNOSIS — Z48812 Encounter for surgical aftercare following surgery on the circulatory system: Secondary | ICD-10-CM | POA: Insufficient documentation

## 2024-04-29 DIAGNOSIS — Z955 Presence of coronary angioplasty implant and graft: Secondary | ICD-10-CM | POA: Insufficient documentation

## 2024-04-29 DIAGNOSIS — Z713 Dietary counseling and surveillance: Secondary | ICD-10-CM | POA: Insufficient documentation

## 2024-04-29 NOTE — Progress Notes (Signed)
 Virtual Visit completed. Patient informed on EP and RD appointment and 6 Minute walk test. Patient also informed of patient health questionnaires on My Chart. Patient Verbalizes understanding. Visit diagnosis can be found in [facility] 04/09/2024.

## 2024-05-01 ENCOUNTER — Ambulatory Visit

## 2024-05-01 VITALS — Ht 69.72 in | Wt 236.5 lb

## 2024-05-01 DIAGNOSIS — Z713 Dietary counseling and surveillance: Secondary | ICD-10-CM | POA: Diagnosis not present

## 2024-05-01 DIAGNOSIS — Z48812 Encounter for surgical aftercare following surgery on the circulatory system: Secondary | ICD-10-CM | POA: Diagnosis not present

## 2024-05-01 DIAGNOSIS — Z955 Presence of coronary angioplasty implant and graft: Secondary | ICD-10-CM | POA: Diagnosis not present

## 2024-05-01 NOTE — Patient Instructions (Signed)
 Patient Instructions  Patient Details  Name: Andrew Lyons MRN: [id number] Date of Birth: 08-07-1946 Referring Provider:  Wilburn Keller BROCKS, MD  Below are your personal goals for exercise, nutrition, and risk factors. Our goal is to help you stay on track towards obtaining and maintaining these goals. We will be discussing your progress on these goals with you throughout the program.  Initial Exercise Prescription:  Initial Exercise Prescription - 05/01/24 1500       Date of Initial Exercise RX and Referring Provider   Date 05/01/24    Referring Provider Wilburn Keller, MD      Oxygen   Maintain Oxygen Saturation 88% or higher      Recumbant Bike   Level 1    RPM 50    Watts 15    Minutes 15    METs 1.63      NuStep   Level 1    SPM 80    Minutes 15    METs 1.63      Arm Ergometer   Level 1    RPM 25    Minutes 15    METs 1.63      Biostep-RELP   Level 1    SPM 50    Minutes 15    METs 1.63      Track   Laps 20    Minutes 15    METs 2.09      Prescription Details   Frequency (times per week) 3    Duration Progress to 30 minutes of continuous aerobic without signs/symptoms of physical distress      Intensity   THRR 40-80% of Max Heartrate 112-132    Ratings of Perceived Exertion 11-13    Perceived Dyspnea 0-4      Progression   Progression Continue to progress workloads to maintain intensity without signs/symptoms of physical distress.      Resistance Training   Training Prescription Yes    Weight 3 lb    Reps 10-15          Exercise Goals: Frequency: Be able to perform aerobic exercise two to three times per week in program working toward 2-5 days per week of home exercise.  Intensity: Work with a perceived exertion of 11 (fairly light) - 15 (hard) while following your exercise prescription.  We will make changes to your prescription with you as you progress through the program.   Duration: Be able to do 30 to 45 minutes of continuous  aerobic exercise in addition to a 5 minute warm-up and a 5 minute cool-down routine.   Nutrition Goals: Your personal nutrition goals will be established when you do your nutrition analysis with the dietician.  The following are general nutrition guidelines to follow: Cholesterol < 200mg /day Sodium < 1500mg /day Fiber: Men over 50 yrs - 30 grams per day  Personal Goals:  Personal Goals and Risk Factors at Admission - 05/01/24 1521       Core Components/Risk Factors/Patient Goals on Admission    Weight Management Yes;Weight Loss    Intervention Weight Management: Develop a combined nutrition and exercise program designed to reach desired caloric intake, while maintaining appropriate intake of nutrient and fiber, sodium and fats, and appropriate energy expenditure required for the weight goal.;Weight Management: Provide education and appropriate resources to help participant work on and attain dietary goals.;Weight Management/Obesity: Establish reasonable short term and long term weight goals.;Obesity: Provide education and appropriate resources to help participant work on and attain dietary goals.  Admit Weight 236 lb 8 oz (107.3 kg)    Goal Weight: Short Term 218 lb (98.9 kg)    Goal Weight: Long Term 200 lb (90.7 kg)    Expected Outcomes Short Term: Continue to assess and modify interventions until short term weight is achieved;Long Term: Adherence to nutrition and physical activity/exercise program aimed toward attainment of established weight goal;Weight Loss: Understanding of general recommendations for a balanced deficit meal plan, which promotes 1-2 lb weight loss per week and includes a negative energy balance of [phone] kcal/d;Understanding recommendations for meals to include 15-35% energy as protein, 25-35% energy from fat, 35-60% energy from carbohydrates, less than 200mg  of dietary cholesterol, 20-35 gm of total fiber daily;Understanding of distribution of calorie intake throughout  the day with the consumption of 4-5 meals/snacks    Diabetes Yes    Intervention Provide education about signs/symptoms and action to take for hypo/hyperglycemia.;Provide education about proper nutrition, including hydration, and aerobic/resistive exercise prescription along with prescribed medications to achieve blood glucose in normal ranges: Fasting glucose 65-99 mg/dL    Expected Outcomes Short Term: Participant verbalizes understanding of the signs/symptoms and immediate care of hyper/hypoglycemia, proper foot care and importance of medication, aerobic/resistive exercise and nutrition plan for blood glucose control.;Long Term: Attainment of HbA1C < 7%.    Hypertension Yes    Intervention Provide education on lifestyle modifcations including regular physical activity/exercise, weight management, moderate sodium restriction and increased consumption of fresh fruit, vegetables, and low fat dairy, alcohol moderation, and smoking cessation.;Monitor prescription use compliance.    Expected Outcomes Short Term: Continued assessment and intervention until BP is < 140/79mm HG in hypertensive participants. < 130/50mm HG in hypertensive participants with diabetes, heart failure or chronic kidney disease.;Long Term: Maintenance of blood pressure at goal levels.    Lipids Yes    Intervention Provide education and support for participant on nutrition & aerobic/resistive exercise along with prescribed medications to achieve LDL 70mg , HDL >40mg .    Expected Outcomes Short Term: Participant states understanding of desired cholesterol values and is compliant with medications prescribed. Participant is following exercise prescription and nutrition guidelines.;Long Term: Cholesterol controlled with medications as prescribed, with individualized exercise RX and with personalized nutrition plan. Value goals: LDL < 70mg , HDL > 40 mg.          Tobacco Use Initial Evaluation: Social History   Tobacco Use  Smoking  Status Former   Current packs/day: 0.00   Types: Cigarettes   Quit date: 10/02/2001   Years since quitting: 22.5   Passive exposure: Past  Smokeless Tobacco Former  Tobacco Comments   Quit smoking in 2003; Started smoking at age 52, smoked about 40 years, smoked over 3 packs per day    Exercise Goals and Review:  Exercise Goals     Row Name 05/01/24 1519             Exercise Goals   Increase Physical Activity Yes       Intervention Provide advice, education, support and counseling about physical activity/exercise needs.;Develop an individualized exercise prescription for aerobic and resistive training based on initial evaluation findings, risk stratification, comorbidities and participant's personal goals.       Expected Outcomes Short Term: Attend rehab on a regular basis to increase amount of physical activity.;Long Term: Add in home exercise to make exercise part of routine and to increase amount of physical activity.;Long Term: Exercising regularly at least 3-5 days a week.       Increase Strength and Stamina Yes  Intervention Provide advice, education, support and counseling about physical activity/exercise needs.;Develop an individualized exercise prescription for aerobic and resistive training based on initial evaluation findings, risk stratification, comorbidities and participant's personal goals.       Expected Outcomes Short Term: Increase workloads from initial exercise prescription for resistance, speed, and METs.;Short Term: Perform resistance training exercises routinely during rehab and add in resistance training at home;Long Term: Improve cardiorespiratory fitness, muscular endurance and strength as measured by increased METs and functional capacity ( )       Able to understand and use rate of perceived exertion (RPE) scale Yes       Intervention Provide education and explanation on how to use RPE scale       Expected Outcomes Short Term: Able to use RPE daily in  rehab to express subjective intensity level;Long Term:  Able to use RPE to guide intensity level when exercising independently       Able to understand and use Dyspnea scale Yes       Intervention Provide education and explanation on how to use Dyspnea scale       Expected Outcomes Short Term: Able to use Dyspnea scale daily in rehab to express subjective sense of shortness of breath during exertion;Long Term: Able to use Dyspnea scale to guide intensity level when exercising independently       Knowledge and understanding of Target Heart Rate Range (THRR) Yes       Intervention Provide education and explanation of THRR including how the numbers were predicted and where they are located for reference       Expected Outcomes Short Term: Able to state/look up THRR;Short Term: Able to use daily as guideline for intensity in rehab;Long Term: Able to use THRR to govern intensity when exercising independently       Able to check pulse independently Yes       Intervention Provide education and demonstration on how to check pulse in carotid and radial arteries.;Review the importance of being able to check your own pulse for safety during independent exercise       Expected Outcomes Short Term: Able to explain why pulse checking is important during independent exercise;Long Term: Able to check pulse independently and accurately       Understanding of Exercise Prescription Yes       Intervention Provide education, explanation, and written materials on patient's individual exercise prescription       Expected Outcomes Short Term: Able to explain program exercise prescription;Long Term: Able to explain home exercise prescription to exercise independently

## 2024-05-01 NOTE — Progress Notes (Signed)
 Assessment start time: 2:35 PM  Digestive issues/concerns: no known food allergies   24-hours Recall: B: bacon, oatmeal OR egg and oatmeal with cranberry juice (1 glass) L: banana sandwich D: hamburger, beans cantaloupe   Beverages water (64oz), cranberry juice, whole milk  Education r/t nutrition plan Patient drinking mostly water, some cranberry juice in the mornings. He also likes whole milk. Spoke with him about saturated fat and sodium. He says he used to salt his food but knows its bad, so he stopped. Reviewed a facts labels and went over how to read labels with guideline limits of less than 12g saturated fat and less than 1500mg  sodium daily. Provided mediterranean diet handout. Educated on types of fats, sources, and how to read labels. Brainstormed some small meals and snack ideas focusing on prioritizing protein without excessive sodium and saturated fat intake.  Goal 1: Eat 15-30gProtein and 30-60gCarbs at each meal. Goal 2: Read labels and reduce sodium intake to below 2300mg . Ideally 1500mg  per day.  Goal 3: Reduce saturated fat, less than 12g per day. Replace bad fats for more heart healthy fats.   End time 3:08 PM

## 2024-05-01 NOTE — Progress Notes (Signed)
 Cardiac Individual Treatment Plan  Patient Details  Name: Andrew Lyons MRN: [id number] Date of Birth: [birth year] Referring Provider:   Flowsheet Row Cardiac Rehab from 05/01/2024 in [facility] Cardiac and Pulmonary Rehab  Referring Provider Wilburn Fillers, MD    Initial Encounter Date:  Flowsheet Row Cardiac Rehab from 05/01/2024 in [facility] Cardiac and Pulmonary Rehab  Date 05/01/24    Visit Diagnosis: Status post coronary artery stent placement  Patient's Home Medications on Admission:  Current Outpatient Medications:    acetaminophen  (TYLENOL ) 325 MG tablet, Take 975 mg by mouth., Disp: , Rfl:    amLODipine  (NORVASC ) 5 MG tablet, Take 5 mg by mouth., Disp: , Rfl:    amLODipine -benazepril  (LOTREL) 5-40 MG capsule, Take 1 capsule by mouth daily. (Patient not taking: Reported on 04/29/2024), Disp: 90 capsule, Rfl: 1   aspirin  EC 81 MG tablet, Take 81 mg by mouth., Disp: , Rfl:    cholecalciferol  (VITAMIN D ) 25 MCG (1000 UT) tablet, Take 1,000 Units by mouth daily. , Disp: , Rfl:    clopidogrel  (PLAVIX ) 75 MG tablet, TAKE 1 TABLET BY MOUTH ONCE A DAY, Disp: 90 tablet, Rfl: 0   Coenzyme Q10 100 MG TABS, Take 100 mg by mouth daily. , Disp: , Rfl:    fluticasone  (FLONASE ) 50 MCG/ACT nasal spray, USE 2 SPRAYS INTO EACH NOSTRIL EVERY DAY, Disp: 48 g, Rfl: 5   JARDIANCE  25 MG TABS tablet, TAKE 1 TABLET BY MOUTH ONCE DAILY, Disp: 90 tablet, Rfl: 3   Lancets MISC, 1 each., Disp: , Rfl:    metFORMIN  (GLUCOPHAGE ) 500 MG tablet, TAKE 1 TABLET BY MOUTH TWICE A DAY WITH A MEAL, Disp: 180 tablet, Rfl: 1   metoprolol  succinate (TOPROL -XL) 100 MG 24 hr tablet, TAKE 1 TABLET BY MOUTH ONCE A DAY WITH OR IMMEDIATELY FOLLOWING A MEAL, Disp: 90 tablet, Rfl: 3   montelukast  (SINGULAIR ) 10 MG tablet, TAKE ONE TABLET BY MOUTH DAILY, Disp: 90 tablet, Rfl: 3   nitroGLYCERIN  (NITROSTAT ) 0.4 MG SL tablet, Place under the tongue., Disp: , Rfl:    omeprazole  (PRILOSEC) 40 MG capsule, TAKE ONE CAPSULE BY MOUTH DAILY, Disp:  90 capsule, Rfl: 3   oxyCODONE  (OXY IR/ROXICODONE ) 5 MG immediate release tablet, Take 5 mg by mouth. (Patient not taking: Reported on 04/29/2024), Disp: , Rfl:    polyethylene glycol (MIRALAX / GLYCOLAX) 17 g packet, Take 17 g by mouth., Disp: , Rfl:    predniSONE  (DELTASONE ) 10 MG tablet, Take by mouth., Disp: , Rfl:    predniSONE  (DELTASONE ) 50 MG tablet, SMARTSIG:1 Tablet(s) By Mouth Every Evening, Disp: , Rfl:    rosuvastatin  (CRESTOR ) 20 MG tablet, TAKE 1 TABLET BY MOUTH ONCE A DAY (Patient not taking: Reported on 04/29/2024), Disp: 90 tablet, Rfl: 3   rosuvastatin  (CRESTOR ) 20 MG tablet, Take 20 mg by mouth., Disp: , Rfl:    senna-docusate (SENOKOT-S) 8.6-50 MG tablet, Take 1 tablet by mouth., Disp: , Rfl:    sildenafil (VIAGRA) 100 MG tablet, Take 100 mg by mouth as needed for erectile dysfunction., Disp: , Rfl:    tirzepatide  (MOUNJARO ) 7.5 MG/0.5ML Pen, Inject 7.5 mg into the skin once a week., Disp: 6 mL, Rfl: 1   vitamin B-12 (CYANOCOBALAMIN ) 1000 MCG tablet, Take 1,000 mcg by mouth daily., Disp: , Rfl:   Past Medical History: Past Medical History:  Diagnosis Date   Adenomatous polyps    Aortic atherosclerosis (HCC)    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    a.) severe; s/p AVR  03/29/2011   Arthritis    Barrett's esophagus with dysplasia    Bilateral carotid artery disease (HCC)    BPH (benign prostatic hyperplasia)    Cataract    COPD (chronic obstructive pulmonary disease) (HCC)    Coronary artery disease 02/12/2002   a.) LHC 02/12/02: 40 pRCA, 80/90 mRCA, 40 dRCA, 50/70 RPDA, 20 pLCx, 20 mLCx, 30 OM1, 40/60 mLAD -> Tx to [facility]; b.) comp PCI 02/13/02: 2.5 x 13mm DES (unk type) to p-mLAD, 2.25 x 7mm BiodivYsio DES to RPDA, 2.25 x 23mm + 3.0 x 18mm + 3.0 x 13mm (unk type); c.) LHC 02/14/11: 25 pLAD-1, 10 ISR pLAD-2, 30 mLAD, 30 mLCx, 30 pRCA, 40 mRCA, 30 dRCA - med mgmt; c.) MV 10/29/19: no isch; d.) MV 05/03/21: no isch   Diastolic dysfunction    a.) TTE 04/05/2017: EF >55%,  mild LVH, G1DD, midl LAE, mild MR/TR/PR, biopros AoV (MPG 10); TTE 12/04/2019: EF >55%, mild LVH, G1DD, mild BAE, mild RVE, triv TR/PR, mild MR, biopros AoV (MPG 12); c.) TTE 04/30/2021: EF >55%, mild LVH, G1DD, mod LAE, triv MR/TR, biopros AoV;  d.) TTE 07/24/2023: EF >55%, G1DD, triv panval regurgitation, mild AS (AVA = 0.8 cm2)   ED (erectile dysfunction)    a.) on PDE5i (tadalafil )   GERD (gastroesophageal reflux disease)    History of 2019 novel coronavirus disease (COVID-19)    Hx of BKA, right (HCC)    Hx of dysplastic nevus 10/19/2006   L upper back, medial to mid scapula, slight to moderate atypia   Hx of dysplastic nevus 03/18/2008   R upper back 4.0cm lat to spine, moderate to marked atypia, excised 04/15/2008   Hx of dysplastic nevus 09/22/2008   R mid back 5.0cm lat to spine, moderate atypia   Hx of dysplastic nevus 04/06/2009   L mid to low back 11.0cm lat to spine, mild atypia   Hyperlipidemia    Hypertension    Internal hemorrhoids    Long term current use of clopidogrel     NSTEMI (non-ST elevated myocardial infarction) (HCC) 02/12/2002   a.) LHC 02/12/2002: complex multi-vessel CAD--> transferred to [facility]; b.) complex PCI 02/13/2002: 2.5 x 13 mm DES (unknown type) to p-mLAD, 2.25 x 7 mm BiodivYsio DES to RPDA, serial 2.25 x 23 mm + 3.0 x 18 mm + 3.0 x 13 mm to mRCA (unknown type)   OAB (overactive bladder)    OSA on CPAP    Peripheral vascular disease (HCC)    a.) s/p RIGHT BKA   Presence of stent in coronary artery    a.) TOTAL of 5 as of 07/27/2023 (p-mLAD x1, RPDA x 1, mRCA x 3)   S/P AVR (aortic valve replacement) 03/29/2011   a.) 25 mm Magna pericardial tissue bioprosthetic valve   Seizures (HCC)    Statin induced/mediated hepatitis    T2DM (type 2 diabetes mellitus) (HCC)     Tobacco Use: Social History   Tobacco Use  Smoking Status Former   Current packs/day: 0.00   Types: Cigarettes   Quit date: 10/02/2001   Years since quitting: 22.5   Passive  exposure: Past  Smokeless Tobacco Former  Tobacco Comments   Quit smoking in 2003; Started smoking at age 17, smoked about 40 years, smoked over 3 packs per day    Labs: Review Flowsheet  More data exists      Latest Ref Rng & Units 06/09/2021 11/29/2021 04/13/2022 08/10/2022 03/11/2024  Labs for ITP Cardiac and Pulmonary Rehab  Cholestrol 100 -  199 mg/dL 841  - 862  - -  LDL (calc) 0 - 99 mg/dL 74  - 69  - -  HDL-C >60 mg/dL 33  - 32  - -  Trlycerides 0 - 149 mg/dL 683  - 780  - -  Hemoglobin A1c 4.0 - 5.6 % 7.2  7.2  7.7  8.0  -  PH, Arterial 7.35 - 7.45 - - - - 7.358   PCO2 arterial 32 - 48 mmHg - - - - 37.2   Bicarbonate 20.0 - 28.0 mmol/L - - - - 20.9  23.8   TCO2 22 - 32 mmol/L - - - - 22  25   Acid-base deficit 0.0 - 2.0 mmol/L - - - - 4.0  2.0   O2 Saturation % - - - - 91  60     Details       Multiple values from one day are sorted in reverse-chronological order          Exercise Target Goals: Exercise Program Goal: Individual exercise prescription set using results from initial 6 min walk test and THRR while considering  patient's activity barriers and safety.   Exercise Prescription Goal: Initial exercise prescription builds to 30-45 minutes a day of aerobic activity, 2-3 days per week.  Home exercise guidelines will be given to patient during program as part of exercise prescription that the participant will acknowledge.   Education: Aerobic Exercise: - Group verbal and visual presentation on the components of exercise prescription. Introduces F.I.T.T principle from ACSM for exercise prescriptions.  Reviews F.I.T.T. principles of aerobic exercise including progression. Written material given at graduation.   Education: Resistance Exercise: - Group verbal and visual presentation on the components of exercise prescription. Introduces F.I.T.T principle from ACSM for exercise prescriptions  Reviews F.I.T.T. principles of resistance exercise including progression.  Written material given at graduation.    Education: Exercise & Equipment Safety: - Individual verbal instruction and demonstration of equipment use and safety with use of the equipment. Flowsheet Row Cardiac Rehab from 05/01/2024 in [facility] Cardiac and Pulmonary Rehab  Date 05/01/24  Educator MB  Instruction Review Code 1- Verbalizes Understanding    Education: Exercise Physiology & General Exercise Guidelines: - Group verbal and written instruction with models to review the exercise physiology of the cardiovascular system and associated critical values. Provides general exercise guidelines with specific guidelines to those with heart or lung disease.    Education: Flexibility, Balance, Mind/Body Relaxation: - Group verbal and visual presentation with interactive activity on the components of exercise prescription. Introduces F.I.T.T principle from ACSM for exercise prescriptions. Reviews F.I.T.T. principles of flexibility and balance exercise training including progression. Also discusses the mind body connection.  Reviews various relaxation techniques to help reduce and manage stress (i.e. Deep breathing, progressive muscle relaxation, and visualization). Balance handout provided to take home. Written material given at graduation.   Activity Barriers & Risk Stratification:  Activity Barriers & Cardiac Risk Stratification - 05/01/24 1516       Activity Barriers & Cardiac Risk Stratification   Activity Barriers Other (comment)    Comments Right leg amputee, 5lb lifting restriction    Cardiac Risk Stratification Moderate          6 Minute Walk:  6 Minute Walk     Row Name 05/01/24 1515         6 Minute Walk   Phase Initial     Distance 860 feet     Walk Time 6 minutes     #  of Rest Breaks 0     MPH 1.63     METS 1.63     RPE 8     Perceived Dyspnea  0     VO2 Peak 5.7     Symptoms No     Resting HR 92 bpm     Resting BP 130/70     Resting Oxygen Saturation  96 %      Exercise Oxygen Saturation  during 6 min walk 97 %     Max Ex. HR 115 bpm     Max Ex. BP 144/60     2 Minute Post BP 126/56        Oxygen Initial Assessment:   Oxygen Re-Evaluation:   Oxygen Discharge (Final Oxygen Re-Evaluation):   Initial Exercise Prescription:  Initial Exercise Prescription - 05/01/24 1500       Date of Initial Exercise RX and Referring Provider   Date 05/01/24    Referring Provider Wilburn Fillers, MD      Oxygen   Maintain Oxygen Saturation 88% or higher      Recumbant Bike   Level 1    RPM 50    Watts 15    Minutes 15    METs 1.63      NuStep   Level 1    SPM 80    Minutes 15    METs 1.63      Arm Ergometer   Level 1    RPM 25    Minutes 15    METs 1.63      Biostep-RELP   Level 1    SPM 50    Minutes 15    METs 1.63      Track   Laps 20    Minutes 15    METs 2.09      Prescription Details   Frequency (times per week) 3    Duration Progress to 30 minutes of continuous aerobic without signs/symptoms of physical distress      Intensity   THRR 40-80% of Max Heartrate 112-132    Ratings of Perceived Exertion 11-13    Perceived Dyspnea 0-4      Progression   Progression Continue to progress workloads to maintain intensity without signs/symptoms of physical distress.      Resistance Training   Training Prescription Yes    Weight 3 lb    Reps 10-15          Perform Capillary Blood Glucose checks as needed.  Exercise Prescription Changes:   Exercise Prescription Changes     Row Name 05/01/24 1500             Response to Exercise   Blood Pressure (Admit) 130/70       Blood Pressure (Exercise) 144/60       Blood Pressure (Exit) 126/56       Heart Rate (Admit) 92 bpm       Heart Rate (Exercise) 115 bpm       Heart Rate (Exit) 96 bpm       Oxygen Saturation (Admit) 96 %       Oxygen Saturation (Exercise) 97 %       Oxygen Saturation (Exit) 97 %       Rating of Perceived Exertion (Exercise) 8        Perceived Dyspnea (Exercise) 0       Symptoms none       Comments results         Progression  Average METs 1.63          Exercise Comments:   Exercise Goals and Review:   Exercise Goals     Row Name 05/01/24 1519             Exercise Goals   Increase Physical Activity Yes       Intervention Provide advice, education, support and counseling about physical activity/exercise needs.;Develop an individualized exercise prescription for aerobic and resistive training based on initial evaluation findings, risk stratification, comorbidities and participant's personal goals.       Expected Outcomes Short Term: Attend rehab on a regular basis to increase amount of physical activity.;Long Term: Add in home exercise to make exercise part of routine and to increase amount of physical activity.;Long Term: Exercising regularly at least 3-5 days a week.       Increase Strength and Stamina Yes       Intervention Provide advice, education, support and counseling about physical activity/exercise needs.;Develop an individualized exercise prescription for aerobic and resistive training based on initial evaluation findings, risk stratification, comorbidities and participant's personal goals.       Expected Outcomes Short Term: Increase workloads from initial exercise prescription for resistance, speed, and METs.;Short Term: Perform resistance training exercises routinely during rehab and add in resistance training at home;Long Term: Improve cardiorespiratory fitness, muscular endurance and strength as measured by increased METs and functional capacity ( )       Able to understand and use rate of perceived exertion (RPE) scale Yes       Intervention Provide education and explanation on how to use RPE scale       Expected Outcomes Short Term: Able to use RPE daily in rehab to express subjective intensity level;Long Term:  Able to use RPE to guide intensity level when exercising independently        Able to understand and use Dyspnea scale Yes       Intervention Provide education and explanation on how to use Dyspnea scale       Expected Outcomes Short Term: Able to use Dyspnea scale daily in rehab to express subjective sense of shortness of breath during exertion;Long Term: Able to use Dyspnea scale to guide intensity level when exercising independently       Knowledge and understanding of Target Heart Rate Range (THRR) Yes       Intervention Provide education and explanation of THRR including how the numbers were predicted and where they are located for reference       Expected Outcomes Short Term: Able to state/look up THRR;Short Term: Able to use daily as guideline for intensity in rehab;Long Term: Able to use THRR to govern intensity when exercising independently       Able to check pulse independently Yes       Intervention Provide education and demonstration on how to check pulse in carotid and radial arteries.;Review the importance of being able to check your own pulse for safety during independent exercise       Expected Outcomes Short Term: Able to explain why pulse checking is important during independent exercise;Long Term: Able to check pulse independently and accurately       Understanding of Exercise Prescription Yes       Intervention Provide education, explanation, and written materials on patient's individual exercise prescription       Expected Outcomes Short Term: Able to explain program exercise prescription;Long Term: Able to explain home exercise prescription to exercise independently  Exercise Goals Re-Evaluation :   Discharge Exercise Prescription (Final Exercise Prescription Changes):  Exercise Prescription Changes - 05/01/24 1500       Response to Exercise   Blood Pressure (Admit) 130/70    Blood Pressure (Exercise) 144/60    Blood Pressure (Exit) 126/56    Heart Rate (Admit) 92 bpm    Heart Rate (Exercise) 115 bpm    Heart Rate (Exit) 96 bpm     Oxygen Saturation (Admit) 96 %    Oxygen Saturation (Exercise) 97 %    Oxygen Saturation (Exit) 97 %    Rating of Perceived Exertion (Exercise) 8    Perceived Dyspnea (Exercise) 0    Symptoms none    Comments results      Progression   Average METs 1.63          Nutrition:  Target Goals: Understanding of nutrition guidelines, daily intake of sodium 1500mg , cholesterol 200mg , calories 30% from fat and 7% or less from saturated fats, daily to have 5 or more servings of fruits and vegetables.  Education: All About Nutrition: -Group instruction provided by verbal, written material, interactive activities, discussions, models, and posters to present general guidelines for heart healthy nutrition including fat, fiber, MyPlate, the role of sodium in heart healthy nutrition, utilization of the nutrition label, and utilization of this knowledge for meal planning. Follow up email sent as well. Written material given at graduation.   Biometrics:  Pre Biometrics - 05/01/24 1520       Pre Biometrics   Height 5' 9.72 (1.771 m)    Weight 236 lb 8 oz (107.3 kg)    Waist Circumference 48 inches    Hip Circumference 47.9 inches    Waist to Hip Ratio 1 %    BMI (Calculated) 34.2    Single Leg Stand 4 seconds           Nutrition Therapy Plan and Nutrition Goals:  Nutrition Therapy & Goals - 05/01/24 1524       Nutrition Therapy   Diet Cardiac, Low Na    Protein (specify units) 70-90    Fiber 30 grams    Whole Grain Foods 3 servings    Saturated Fats 15 max. grams    Fruits and Vegetables 5 servings/day    Sodium 2 grams      Personal Nutrition Goals   Nutrition Goal Eat 15-30gProtein and 30-60gCarbs at each meal.    Personal Goal #2 Read labels and reduce sodium intake to below 2300mg . Ideally 1500mg  per day.    Personal Goal #3 Reduce saturated fat, less than 12g per day. Replace bad fats for more heart healthy fats.    Comments Patient drinking mostly water, some  cranberry juice in the mornings. He also likes whole milk. Spoke with him about saturated fat and sodium. He says he used to salt his food but knows its bad, so he stopped. Reviewed a facts labels and went over how to read labels with guideline limits of less than 12g saturated fat and less than 1500mg  sodium daily. Provided mediterranean diet handout. Educated on types of fats, sources, and how to read labels. Brainstormed some small meals and snack ideas focusing on prioritizing protein without excessive sodium and saturated fat intake.      Intervention Plan   Intervention Prescribe, educate and counsel regarding individualized specific dietary modifications aiming towards targeted core components such as weight, hypertension, lipid management, diabetes, heart failure and other comorbidities.;Nutrition handout(s) given to patient.  Expected Outcomes Long Term Goal: Adherence to prescribed nutrition plan.;Short Term Goal: A plan has been developed with personal nutrition goals set during dietitian appointment.;Short Term Goal: Understand basic principles of dietary content, such as calories, fat, sodium, cholesterol and nutrients.          Nutrition Assessments:  MEDIFICTS Score Key: >=70 Need to make dietary changes  40-70 Heart Healthy Diet <= 40 Therapeutic Level Cholesterol Diet   Picture Your Plate Scores: <59 Unhealthy dietary pattern with much room for improvement. 41-50 Dietary pattern unlikely to meet recommendations for good health and room for improvement. 51-60 More healthful dietary pattern, with some room for improvement.  >60 Healthy dietary pattern, although there may be some specific behaviors that could be improved.    Nutrition Goals Re-Evaluation:   Nutrition Goals Discharge (Final Nutrition Goals Re-Evaluation):   Psychosocial: Target Goals: Acknowledge presence or absence of significant depression and/or stress, maximize coping skills, provide positive  support system. Participant is able to verbalize types and ability to use techniques and skills needed for reducing stress and depression.   Education: Stress, Anxiety, and Depression - Group verbal and visual presentation to define topics covered.  Reviews how body is impacted by stress, anxiety, and depression.  Also discusses healthy ways to reduce stress and to treat/manage anxiety and depression.  Written material given at graduation.   Education: Sleep Hygiene -Provides group verbal and written instruction about how sleep can affect your health.  Define sleep hygiene, discuss sleep cycles and impact of sleep habits. Review good sleep hygiene tips.    Initial Review & Psychosocial Screening:  Initial Psych Review & Screening - 04/29/24 1504       Initial Review   Current issues with None Identified      Family Dynamics   Good Support System? Yes    Comments He can look to his wife for support and states no mental instability.      Barriers   Psychosocial barriers to participate in program The patient should benefit from training in stress management and relaxation.;There are no identifiable barriers or psychosocial needs.      Screening Interventions   Interventions To provide support and resources with identified psychosocial needs;Provide feedback about the scores to participant;Encouraged to exercise    Expected Outcomes Short Term goal: Utilizing psychosocial counselor, staff and physician to assist with identification of specific Stressors or current issues interfering with healing process. Setting desired goal for each stressor or current issue identified.;Long Term Goal: Stressors or current issues are controlled or eliminated.;Short Term goal: Identification and review with participant of any Quality of Life or Depression concerns found by scoring the questionnaire.;Long Term goal: The participant improves quality of Life and PHQ9 Scores as seen by post scores and/or  verbalization of changes          Quality of Life Scores:   Scores of 19 and below usually indicate a poorer quality of life in these areas.  A difference of  2-3 points is a clinically meaningful difference.  A difference of 2-3 points in the total score of the Quality of Life Index has been associated with significant improvement in overall quality of life, self-image, physical symptoms, and general health in studies assessing change in quality of life.  PHQ-9: Review Flowsheet  More data exists      05/01/2024 06/09/2022 05/04/2022 10/18/2021 06/08/2021  Depression screen PHQ 2/9  Decreased Interest 0 0 0 0 0  Down, Depressed, Hopeless 0 0 0 0 0  PHQ - 2 Score 0 0 0 0 0  Altered sleeping 3 0 - - 0  Tired, decreased energy 0 0 - - 0  Change in appetite 0 0 - - 0  Feeling bad or failure about yourself  0 0 - - 0  Trouble concentrating 0 0 - - 0  Moving slowly or fidgety/restless 0 0 - - 0  Suicidal thoughts 0 0 - - 0  PHQ-9 Score 3 0 - - 0  Difficult doing work/chores Not difficult at all Not difficult at all - - Not difficult at all   Interpretation of Total Score  Total Score Depression Severity:  1-4 = Minimal depression, 5-9 = Mild depression, 10-14 = Moderate depression, 15-19 = Moderately severe depression, 20-27 = Severe depression   Psychosocial Evaluation and Intervention:  Psychosocial Evaluation - 04/29/24 1505       Psychosocial Evaluation & Interventions   Interventions Encouraged to exercise with the program and follow exercise prescription;Relaxation education;Stress management education    Comments He can look to his wife for support and states no mental instability.    Expected Outcomes Short: Start [facility] to help with mood. Long: Maintain a healthy mental state    Continue Psychosocial Services  Follow up required by staff          Psychosocial Re-Evaluation:   Psychosocial Discharge (Final Psychosocial Re-Evaluation):   Vocational  Rehabilitation: Provide vocational rehab assistance to qualifying candidates.   Vocational Rehab Evaluation & Intervention:   Education: Education Goals: Education classes will be provided on a variety of topics geared toward better understanding of heart health and risk factor modification. Participant will state understanding/return demonstration of topics presented as noted by education test scores.  Learning Barriers/Preferences:  Learning Barriers/Preferences - 04/29/24 1504       Learning Barriers/Preferences   Learning Barriers --          General Cardiac Education Topics:  AED/CPR: - Group verbal and written instruction with the use of models to demonstrate the basic use of the AED with the basic ABC's of resuscitation.   Anatomy and Cardiac Procedures: - Group verbal and visual presentation and models provide information about basic cardiac anatomy and function. Reviews the testing methods done to diagnose heart disease and the outcomes of the test results. Describes the treatment choices: Medical Management, Angioplasty, or Coronary Bypass Surgery for treating various heart conditions including Myocardial Infarction, Angina, Valve Disease, and Cardiac Arrhythmias.  Written material given at graduation.   Medication Safety: - Group verbal and visual instruction to review commonly prescribed medications for heart and lung disease. Reviews the medication, class of the drug, and side effects. Includes the steps to properly store meds and maintain the prescription regimen.  Written material given at graduation.   Intimacy: - Group verbal instruction through game format to discuss how heart and lung disease can affect sexual intimacy. Written material given at graduation..   Know Your Numbers and Heart Failure: - Group verbal and visual instruction to discuss disease risk factors for cardiac and pulmonary disease and treatment options.  Reviews associated critical values  for Overweight/Obesity, Hypertension, Cholesterol, and Diabetes.  Discusses basics of heart failure: signs/symptoms and treatments.  Introduces Heart Failure Zone chart for action plan for heart failure.  Written material given at graduation.   Infection Prevention: - Provides verbal and written material to individual with discussion of infection control including proper hand washing and proper equipment cleaning during exercise session. Flowsheet Row Cardiac Rehab from 05/01/2024  in [facility] Cardiac and Pulmonary Rehab  Date 05/01/24  Educator MB  Instruction Review Code 1- Verbalizes Understanding    Falls Prevention: - Provides verbal and written material to individual with discussion of falls prevention and safety. Flowsheet Row Cardiac Rehab from 05/01/2024 in [facility] Cardiac and Pulmonary Rehab  Date 05/01/24  Educator MB  Instruction Review Code 1- Verbalizes Understanding    Other: -Provides group and verbal instruction on various topics (see comments)   Knowledge Questionnaire Score:   Core Components/Risk Factors/Patient Goals at Admission:  Personal Goals and Risk Factors at Admission - 05/01/24 1521       Core Components/Risk Factors/Patient Goals on Admission    Weight Management Yes;Weight Loss    Intervention Weight Management: Develop a combined nutrition and exercise program designed to reach desired caloric intake, while maintaining appropriate intake of nutrient and fiber, sodium and fats, and appropriate energy expenditure required for the weight goal.;Weight Management: Provide education and appropriate resources to help participant work on and attain dietary goals.;Weight Management/Obesity: Establish reasonable short term and long term weight goals.;Obesity: Provide education and appropriate resources to help participant work on and attain dietary goals.    Admit Weight 236 lb 8 oz (107.3 kg)    Goal Weight: Short Term 218 lb (98.9 kg)    Goal Weight: Long Term 200 lb  (90.7 kg)    Expected Outcomes Short Term: Continue to assess and modify interventions until short term weight is achieved;Long Term: Adherence to nutrition and physical activity/exercise program aimed toward attainment of established weight goal;Weight Loss: Understanding of general recommendations for a balanced deficit meal plan, which promotes 1-2 lb weight loss per week and includes a negative energy balance of [phone] kcal/d;Understanding recommendations for meals to include 15-35% energy as protein, 25-35% energy from fat, 35-60% energy from carbohydrates, less than 200mg  of dietary cholesterol, 20-35 gm of total fiber daily;Understanding of distribution of calorie intake throughout the day with the consumption of 4-5 meals/snacks    Diabetes Yes    Intervention Provide education about signs/symptoms and action to take for hypo/hyperglycemia.;Provide education about proper nutrition, including hydration, and aerobic/resistive exercise prescription along with prescribed medications to achieve blood glucose in normal ranges: Fasting glucose 65-99 mg/dL    Expected Outcomes Short Term: Participant verbalizes understanding of the signs/symptoms and immediate care of hyper/hypoglycemia, proper foot care and importance of medication, aerobic/resistive exercise and nutrition plan for blood glucose control.;Long Term: Attainment of HbA1C < 7%.    Hypertension Yes    Intervention Provide education on lifestyle modifcations including regular physical activity/exercise, weight management, moderate sodium restriction and increased consumption of fresh fruit, vegetables, and low fat dairy, alcohol moderation, and smoking cessation.;Monitor prescription use compliance.    Expected Outcomes Short Term: Continued assessment and intervention until BP is < 140/54mm HG in hypertensive participants. < 130/63mm HG in hypertensive participants with diabetes, heart failure or chronic kidney disease.;Long Term: Maintenance  of blood pressure at goal levels.    Lipids Yes    Intervention Provide education and support for participant on nutrition & aerobic/resistive exercise along with prescribed medications to achieve LDL 70mg , HDL >40mg .    Expected Outcomes Short Term: Participant states understanding of desired cholesterol values and is compliant with medications prescribed. Participant is following exercise prescription and nutrition guidelines.;Long Term: Cholesterol controlled with medications as prescribed, with individualized exercise RX and with personalized nutrition plan. Value goals: LDL < 70mg , HDL > 40 mg.          Education:Diabetes -  Individual verbal and written instruction to review signs/symptoms of diabetes, desired ranges of glucose level fasting, after meals and with exercise. Acknowledge that pre and post exercise glucose checks will be done for 3 sessions at entry of program. Flowsheet Row Cardiac Rehab from 05/01/2024 in [facility] Cardiac and Pulmonary Rehab  Date 05/01/24  Educator MB  Instruction Review Code 1- Verbalizes Understanding    Core Components/Risk Factors/Patient Goals Review:    Core Components/Risk Factors/Patient Goals at Discharge (Final Review):    ITP Comments:  ITP Comments     Row Name 04/29/24 1506 05/01/24 1515         ITP Comments Virtual Visit completed. Patient informed on EP and RD appointment and 6 Minute walk test. Patient also informed of patient health questionnaires on My Chart. Patient Verbalizes understanding. Visit diagnosis can be found in [facility] 04/09/2024. Completed and gym orientation for cardiac rehab. Initial ITP created and sent for review to Dr. Oneil Pinal, Medical Director.         Comments: Initial ITP

## 2024-05-10 DIAGNOSIS — I350 Nonrheumatic aortic (valve) stenosis: Secondary | ICD-10-CM | POA: Diagnosis not present

## 2024-05-10 DIAGNOSIS — M1990 Unspecified osteoarthritis, unspecified site: Secondary | ICD-10-CM | POA: Diagnosis not present

## 2024-05-10 DIAGNOSIS — G4733 Obstructive sleep apnea (adult) (pediatric): Secondary | ICD-10-CM | POA: Diagnosis not present

## 2024-05-10 DIAGNOSIS — I10 Essential (primary) hypertension: Secondary | ICD-10-CM | POA: Diagnosis not present

## 2024-05-10 DIAGNOSIS — I6523 Occlusion and stenosis of bilateral carotid arteries: Secondary | ICD-10-CM | POA: Diagnosis not present

## 2024-05-10 DIAGNOSIS — Z952 Presence of prosthetic heart valve: Secondary | ICD-10-CM | POA: Diagnosis not present

## 2024-05-10 DIAGNOSIS — I25118 Atherosclerotic heart disease of native coronary artery with other forms of angina pectoris: Secondary | ICD-10-CM | POA: Diagnosis not present

## 2024-05-10 DIAGNOSIS — Z125 Encounter for screening for malignant neoplasm of prostate: Secondary | ICD-10-CM | POA: Diagnosis not present

## 2024-05-10 DIAGNOSIS — I739 Peripheral vascular disease, unspecified: Secondary | ICD-10-CM | POA: Diagnosis not present

## 2024-05-10 DIAGNOSIS — E1169 Type 2 diabetes mellitus with other specified complication: Secondary | ICD-10-CM | POA: Diagnosis not present

## 2024-05-13 ENCOUNTER — Encounter

## 2024-05-13 DIAGNOSIS — Z48812 Encounter for surgical aftercare following surgery on the circulatory system: Secondary | ICD-10-CM | POA: Diagnosis not present

## 2024-05-13 DIAGNOSIS — Z955 Presence of coronary angioplasty implant and graft: Secondary | ICD-10-CM

## 2024-05-13 LAB — GLUCOSE, CAPILLARY
Glucose-Capillary: 179 mg/dL — ABNORMAL HIGH (ref 70–99)
Glucose-Capillary: 122 mg/dL — ABNORMAL HIGH (ref 70–99)

## 2024-05-13 NOTE — Progress Notes (Signed)
 Daily Session Note  Patient Details  Name: EDGARD DEBORD MRN: [id number] Date of Birth: 1945/11/29 Referring Provider:   Flowsheet Row Cardiac Rehab from 05/01/2024 in [facility] Cardiac and Pulmonary Rehab  Referring Provider Wilburn Fillers, MD    Encounter Date: 05/13/2024  Check In:  Session Check In - 05/13/24 1126       Check-In   Supervising physician immediately available to respond to emergencies See telemetry face sheet for immediately available ER MD    Location [facility]-Cardiac & Pulmonary Rehab    Staff Present Burnard Davenport RN,BSN,MPA;Meredith Tressa RN,BSN;Joseph Rolinda RCP,RRT,BSRT;Lauren Cates RN,BSN;Beverlie Kurihara Dyane BS, ACSM CEP, Exercise Physiologist    Virtual Visit No    Medication changes reported     No    Fall or balance concerns reported    No    Warm-up and Cool-down Performed on first and last piece of equipment    Resistance Training Performed Yes    VAD Patient? No    PAD/SET Patient? No      Pain Assessment   Currently in Pain? No/denies             Social History   Tobacco Use  Smoking Status Former   Current packs/day: 0.00   Types: Cigarettes   Quit date: 10/02/2001   Years since quitting: 22.6   Passive exposure: Past  Smokeless Tobacco Former  Tobacco Comments   Quit smoking in 2003; Started smoking at age 56, smoked about 40 years, smoked over 3 packs per day    Goals Met:  Independence with exercise equipment Exercise tolerated well No report of concerns or symptoms today Strength training completed today  Goals Unmet:  Not Applicable  Comments: First full day of exercise!  Patient was oriented to gym and equipment including functions, settings, policies, and procedures.  Patient's individual exercise prescription and treatment plan were reviewed.  All starting workloads were established based on the results of the 6 minute walk test done at initial orientation visit.  The plan for exercise progression was also introduced and  progression will be customized based on patient's performance and goals.     Dr. Oneil Pinal is Medical Director for [facility] Cardiac Rehabilitation.  Dr. Fuad Aleskerov is Medical Director for [facility] Pulmonary Rehabilitation.

## 2024-05-15 ENCOUNTER — Encounter

## 2024-05-15 DIAGNOSIS — Z48812 Encounter for surgical aftercare following surgery on the circulatory system: Secondary | ICD-10-CM | POA: Diagnosis not present

## 2024-05-15 DIAGNOSIS — Z955 Presence of coronary angioplasty implant and graft: Secondary | ICD-10-CM

## 2024-05-15 LAB — GLUCOSE, CAPILLARY
Glucose-Capillary: 208 mg/dL — ABNORMAL HIGH (ref 70–99)
Glucose-Capillary: 121 mg/dL — ABNORMAL HIGH (ref 70–99)

## 2024-05-15 NOTE — Progress Notes (Signed)
 Daily Session Note  Patient Details  Name: DEMOND SHALLENBERGER MRN: [id number] Date of Birth: 1945-11-30 Referring Provider:   Flowsheet Row Cardiac Rehab from 05/01/2024 in [facility] Cardiac and Pulmonary Rehab  Referring Provider Wilburn Fillers, MD    Encounter Date: 05/15/2024  Check In:  Session Check In - 05/15/24 1041       Check-In   Supervising physician immediately available to respond to emergencies See telemetry face sheet for immediately available ER MD    Location [facility]-Cardiac & Pulmonary Rehab    Staff Present Burnard Davenport RN,BSN,MPA;Joseph Rolinda RCP,RRT,BSRT;Jason Elnor RDN,LDN;Noah Tickle, BS, Exercise Physiologist    Virtual Visit No    Medication changes reported     No    Fall or balance concerns reported    No    Warm-up and Cool-down Performed on first and last piece of equipment    Resistance Training Performed Yes    VAD Patient? No    PAD/SET Patient? No      Pain Assessment   Currently in Pain? No/denies             Social History   Tobacco Use  Smoking Status Former   Current packs/day: 0.00   Types: Cigarettes   Quit date: 10/02/2001   Years since quitting: 22.6   Passive exposure: Past  Smokeless Tobacco Former  Tobacco Comments   Quit smoking in 2003; Started smoking at age 68, smoked about 40 years, smoked over 3 packs per day    Goals Met:  Independence with exercise equipment Exercise tolerated well No report of concerns or symptoms today Strength training completed today  Goals Unmet:  Not Applicable  Comments: Pt able to follow exercise prescription today without complaint.  Will continue to monitor for progression.    Dr. Oneil Pinal is Medical Director for [facility] Cardiac Rehabilitation.  Dr. Fuad Aleskerov is Medical Director for [facility] Pulmonary Rehabilitation.

## 2024-05-16 DIAGNOSIS — E782 Mixed hyperlipidemia: Secondary | ICD-10-CM | POA: Diagnosis not present

## 2024-05-16 DIAGNOSIS — Z952 Presence of prosthetic heart valve: Secondary | ICD-10-CM | POA: Diagnosis not present

## 2024-05-16 DIAGNOSIS — I2510 Atherosclerotic heart disease of native coronary artery without angina pectoris: Secondary | ICD-10-CM | POA: Diagnosis not present

## 2024-05-16 DIAGNOSIS — Z955 Presence of coronary angioplasty implant and graft: Secondary | ICD-10-CM | POA: Diagnosis not present

## 2024-05-16 DIAGNOSIS — I10 Essential (primary) hypertension: Secondary | ICD-10-CM | POA: Diagnosis not present

## 2024-05-17 ENCOUNTER — Encounter

## 2024-05-17 DIAGNOSIS — Z48812 Encounter for surgical aftercare following surgery on the circulatory system: Secondary | ICD-10-CM | POA: Diagnosis not present

## 2024-05-17 DIAGNOSIS — Z955 Presence of coronary angioplasty implant and graft: Secondary | ICD-10-CM

## 2024-05-17 LAB — GLUCOSE, CAPILLARY
Glucose-Capillary: 171 mg/dL — ABNORMAL HIGH (ref 70–99)
Glucose-Capillary: 102 mg/dL — ABNORMAL HIGH (ref 70–99)

## 2024-05-17 NOTE — Progress Notes (Signed)
 Daily Session Note  Patient Details  Name: ZARIF RATHJE MRN: [id number] Date of Birth: 1946-04-01 Referring Provider:   Flowsheet Row Cardiac Rehab from 05/01/2024 in [facility] Cardiac and Pulmonary Rehab  Referring Provider Wilburn Fillers, MD    Encounter Date: 05/17/2024  Check In:  Session Check In - 05/17/24 1047       Check-In   Supervising physician immediately available to respond to emergencies See telemetry face sheet for immediately available ER MD    Location [facility]-Cardiac & Pulmonary Rehab    Staff Present Burnard Davenport RN,BSN,MPA;Joseph Hood RCP,RRT,BSRT;Maxon Conetta BS, Exercise Physiologist;Noah Tickle, BS, Exercise Physiologist    Virtual Visit No    Medication changes reported     No    Fall or balance concerns reported    No    Warm-up and Cool-down Performed on first and last piece of equipment    Resistance Training Performed Yes    VAD Patient? No    PAD/SET Patient? No      Pain Assessment   Currently in Pain? No/denies             Social History   Tobacco Use  Smoking Status Former   Current packs/day: 0.00   Types: Cigarettes   Quit date: 10/02/2001   Years since quitting: 22.6   Passive exposure: Past  Smokeless Tobacco Former  Tobacco Comments   Quit smoking in 2003; Started smoking at age 58, smoked about 40 years, smoked over 3 packs per day    Goals Met:  Independence with exercise equipment Exercise tolerated well No report of concerns or symptoms today Strength training completed today  Goals Unmet:  Not Applicable  Comments: Pt able to follow exercise prescription today without complaint.  Will continue to monitor for progression.    Dr. Oneil Pinal is Medical Director for [facility] Cardiac Rehabilitation.  Dr. Fuad Aleskerov is Medical Director for [facility] Pulmonary Rehabilitation.

## 2024-05-20 ENCOUNTER — Encounter

## 2024-05-20 DIAGNOSIS — Z955 Presence of coronary angioplasty implant and graft: Secondary | ICD-10-CM

## 2024-05-20 DIAGNOSIS — Z48812 Encounter for surgical aftercare following surgery on the circulatory system: Secondary | ICD-10-CM | POA: Diagnosis not present

## 2024-05-20 NOTE — Progress Notes (Signed)
 Daily Session Note  Patient Details  Name: Andrew Lyons MRN: [id number] Date of Birth: [birth year] Referring Provider:   Flowsheet Row Cardiac Rehab from 05/01/2024 in [facility] Cardiac and Pulmonary Rehab  Referring Provider Wilburn Fillers, MD    Encounter Date: 05/20/2024  Check In:  Session Check In - 05/20/24 1053       Check-In   Supervising physician immediately available to respond to emergencies See telemetry face sheet for immediately available ER MD    Location [facility]-Cardiac & Pulmonary Rehab    Staff Present Burnard Davenport RN,BSN,MPA;Joseph Rolinda RCP,RRT,BSRT;Laura Cates RN,BSN;Yoshito Gaza Dyane BS, ACSM CEP, Exercise Physiologist    Virtual Visit No    Medication changes reported     No    Fall or balance concerns reported    No    Warm-up and Cool-down Performed on first and last piece of equipment    Resistance Training Performed Yes    VAD Patient? No    PAD/SET Patient? No      Pain Assessment   Currently in Pain? No/denies             Social History   Tobacco Use  Smoking Status Former   Current packs/day: 0.00   Types: Cigarettes   Quit date: 10/02/2001   Years since quitting: 22.6   Passive exposure: Past  Smokeless Tobacco Former  Tobacco Comments   Quit smoking in 2003; Started smoking at age 61, smoked about 40 years, smoked over 3 packs per day    Goals Met:  Independence with exercise equipment Exercise tolerated well No report of concerns or symptoms today Strength training completed today  Goals Unmet:  Not Applicable  Comments: Pt able to follow exercise prescription today without complaint.  Will continue to monitor for progression.    Dr. Oneil Pinal is Medical Director for [facility] Cardiac Rehabilitation.  Dr. Fuad Aleskerov is Medical Director for [facility] Pulmonary Rehabilitation.

## 2024-05-22 ENCOUNTER — Encounter: Admitting: Emergency Medicine

## 2024-05-22 DIAGNOSIS — Z955 Presence of coronary angioplasty implant and graft: Secondary | ICD-10-CM

## 2024-05-22 DIAGNOSIS — Z48812 Encounter for surgical aftercare following surgery on the circulatory system: Secondary | ICD-10-CM | POA: Diagnosis not present

## 2024-05-22 NOTE — Progress Notes (Signed)
 Cardiac Individual Treatment Plan  Patient Details  Name: Andrew Lyons MRN: [id number] Date of Birth: [birth year] Referring Provider:   Flowsheet Row Cardiac Rehab from 05/01/2024 in [facility] Cardiac and Pulmonary Rehab  Referring Provider Wilburn Fillers, MD    Initial Encounter Date:  Flowsheet Row Cardiac Rehab from 05/01/2024 in [facility] Cardiac and Pulmonary Rehab  Date 05/01/24    Visit Diagnosis: Status post coronary artery stent placement  Patient's Home Medications on Admission:  Current Outpatient Medications:    amLODipine  (NORVASC ) 5 MG tablet, Take 5 mg by mouth., Disp: , Rfl:    amLODipine -benazepril  (LOTREL) 5-40 MG capsule, Take 1 capsule by mouth daily. (Patient not taking: Reported on 04/29/2024), Disp: 90 capsule, Rfl: 1   aspirin  EC 81 MG tablet, Take 81 mg by mouth., Disp: , Rfl:    cholecalciferol  (VITAMIN D ) 25 MCG (1000 UT) tablet, Take 1,000 Units by mouth daily. , Disp: , Rfl:    clopidogrel  (PLAVIX ) 75 MG tablet, TAKE 1 TABLET BY MOUTH ONCE A DAY, Disp: 90 tablet, Rfl: 0   Coenzyme Q10 100 MG TABS, Take 100 mg by mouth daily. , Disp: , Rfl:    fluticasone  (FLONASE ) 50 MCG/ACT nasal spray, USE 2 SPRAYS INTO EACH NOSTRIL EVERY DAY, Disp: 48 g, Rfl: 5   JARDIANCE  25 MG TABS tablet, TAKE 1 TABLET BY MOUTH ONCE DAILY, Disp: 90 tablet, Rfl: 3   Lancets MISC, 1 each., Disp: , Rfl:    metFORMIN  (GLUCOPHAGE ) 500 MG tablet, TAKE 1 TABLET BY MOUTH TWICE A DAY WITH A MEAL, Disp: 180 tablet, Rfl: 1   metoprolol  succinate (TOPROL -XL) 100 MG 24 hr tablet, TAKE 1 TABLET BY MOUTH ONCE A DAY WITH OR IMMEDIATELY FOLLOWING A MEAL, Disp: 90 tablet, Rfl: 3   montelukast  (SINGULAIR ) 10 MG tablet, TAKE ONE TABLET BY MOUTH DAILY, Disp: 90 tablet, Rfl: 3   nitroGLYCERIN  (NITROSTAT ) 0.4 MG SL tablet, Place under the tongue., Disp: , Rfl:    omeprazole  (PRILOSEC) 40 MG capsule, TAKE ONE CAPSULE BY MOUTH DAILY, Disp: 90 capsule, Rfl: 3   polyethylene glycol (MIRALAX / GLYCOLAX) 17 g packet, Take  17 g by mouth., Disp: , Rfl:    predniSONE  (DELTASONE ) 10 MG tablet, Take by mouth., Disp: , Rfl:    predniSONE  (DELTASONE ) 50 MG tablet, SMARTSIG:1 Tablet(s) By Mouth Every Evening, Disp: , Rfl:    rosuvastatin  (CRESTOR ) 20 MG tablet, TAKE 1 TABLET BY MOUTH ONCE A DAY (Patient not taking: Reported on 04/29/2024), Disp: 90 tablet, Rfl: 3   rosuvastatin  (CRESTOR ) 20 MG tablet, Take 20 mg by mouth., Disp: , Rfl:    senna-docusate (SENOKOT-S) 8.6-50 MG tablet, Take 1 tablet by mouth., Disp: , Rfl:    sildenafil (VIAGRA) 100 MG tablet, Take 100 mg by mouth as needed for erectile dysfunction., Disp: , Rfl:    tirzepatide  (MOUNJARO ) 7.5 MG/0.5ML Pen, Inject 7.5 mg into the skin once a week., Disp: 6 mL, Rfl: 1   vitamin B-12 (CYANOCOBALAMIN ) 1000 MCG tablet, Take 1,000 mcg by mouth daily., Disp: , Rfl:   Past Medical History: Past Medical History:  Diagnosis Date   Adenomatous polyps    Aortic atherosclerosis (HCC)    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    a.) severe; s/p AVR  03/29/2011   Arthritis    Barrett's esophagus with dysplasia    Bilateral carotid artery disease (HCC)    BPH (benign prostatic hyperplasia)    Cataract    COPD (chronic obstructive pulmonary disease) (HCC)  Coronary artery disease 02/12/2002   a.) LHC 02/12/02: 40 pRCA, 80/90 mRCA, 40 dRCA, 50/70 RPDA, 20 pLCx, 20 mLCx, 30 OM1, 40/60 mLAD -> Tx to [facility]; b.) comp PCI 02/13/02: 2.5 x 13mm DES (unk type) to p-mLAD, 2.25 x 7mm BiodivYsio DES to RPDA, 2.25 x 23mm + 3.0 x 18mm + 3.0 x 13mm (unk type); c.) LHC 02/14/11: 25 pLAD-1, 10 ISR pLAD-2, 30 mLAD, 30 mLCx, 30 pRCA, 40 mRCA, 30 dRCA - med mgmt; c.) MV 10/29/19: no isch; d.) MV 05/03/21: no isch   Diastolic dysfunction    a.) TTE 04/05/2017: EF >55%, mild LVH, G1DD, midl LAE, mild MR/TR/PR, biopros AoV (MPG 10); TTE 12/04/2019: EF >55%, mild LVH, G1DD, mild BAE, mild RVE, triv TR/PR, mild MR, biopros AoV (MPG 12); c.) TTE 04/30/2021: EF >55%, mild LVH, G1DD, mod LAE, triv  MR/TR, biopros AoV;  d.) TTE 07/24/2023: EF >55%, G1DD, triv panval regurgitation, mild AS (AVA = 0.8 cm2)   ED (erectile dysfunction)    a.) on PDE5i (tadalafil )   GERD (gastroesophageal reflux disease)    History of 2019 novel coronavirus disease (COVID-19)    Hx of BKA, right (HCC)    Hx of dysplastic nevus 10/19/2006   L upper back, medial to mid scapula, slight to moderate atypia   Hx of dysplastic nevus 03/18/2008   R upper back 4.0cm lat to spine, moderate to marked atypia, excised 04/15/2008   Hx of dysplastic nevus 09/22/2008   R mid back 5.0cm lat to spine, moderate atypia   Hx of dysplastic nevus 04/06/2009   L mid to low back 11.0cm lat to spine, mild atypia   Hyperlipidemia    Hypertension    Internal hemorrhoids    Long term current use of clopidogrel     NSTEMI (non-ST elevated myocardial infarction) (HCC) 02/12/2002   a.) LHC 02/12/2002: complex multi-vessel CAD--> transferred to [facility] b.) complex PCI 02/13/2002: 2.5 x 13 mm DES (unknown type) to p-mLAD, 2.25 x 7 mm BiodivYsio DES to RPDA, serial 2.25 x 23 mm + 3.0 x 18 mm + 3.0 x 13 mm to mRCA (unknown type)   OAB (overactive bladder)    OSA on CPAP    Peripheral vascular disease (HCC)    a.) s/p RIGHT BKA   Presence of stent in coronary artery    a.) TOTAL of 5 as of 07/27/2023 (p-mLAD x1, RPDA x 1, mRCA x 3)   S/P AVR (aortic valve replacement) 03/29/2011   a.) 25 mm Magna pericardial tissue bioprosthetic valve   Seizures (HCC)    Statin induced/mediated hepatitis    T2DM (type 2 diabetes mellitus) (HCC)     Tobacco Use: Social History   Tobacco Use  Smoking Status Former   Current packs/day: 0.00   Types: Cigarettes   Quit date: 10/02/2001   Years since quitting: 22.6   Passive exposure: Past  Smokeless Tobacco Former  Tobacco Comments   Quit smoking in 2003; Started smoking at age 23, smoked about 40 years, smoked over 3 packs per day    Labs: Review Flowsheet  More data exists      Latest Ref Rng  & Units 06/09/2021 11/29/2021 04/13/2022 08/10/2022 03/11/2024  Labs for ITP Cardiac and Pulmonary Rehab  Cholestrol 100 - 199 mg/dL 841  - 862  - -  LDL (calc) 0 - 99 mg/dL 74  - 69  - -  HDL-C >60 mg/dL 33  - 32  - -  Trlycerides 0 - 149 mg/dL 683  -  219  - -  Hemoglobin A1c 4.0 - 5.6 % 7.2  7.2  7.7  8.0  -  PH, Arterial 7.35 - 7.45 - - - - 7.358   PCO2 arterial 32 - 48 mmHg - - - - 37.2   Bicarbonate 20.0 - 28.0 mmol/L - - - - 20.9  23.8   TCO2 22 - 32 mmol/L - - - - 22  25   Acid-base deficit 0.0 - 2.0 mmol/L - - - - 4.0  2.0   O2 Saturation % - - - - 91  60     Details       Multiple values from one day are sorted in reverse-chronological order          Exercise Target Goals: Exercise Program Goal: Individual exercise prescription set using results from initial 6 min walk test and THRR while considering  patient's activity barriers and safety.   Exercise Prescription Goal: Initial exercise prescription builds to 30-45 minutes a day of aerobic activity, 2-3 days per week.  Home exercise guidelines will be given to patient during program as part of exercise prescription that the participant will acknowledge.   Education: Aerobic Exercise: - Group verbal and visual presentation on the components of exercise prescription. Introduces F.I.T.T principle from ACSM for exercise prescriptions.  Reviews F.I.T.T. principles of aerobic exercise including progression. Written material provided at class time.   Education: Resistance Exercise: - Group verbal and visual presentation on the components of exercise prescription. Introduces F.I.T.T principle from ACSM for exercise prescriptions  Reviews F.I.T.T. principles of resistance exercise including progression. Written material provided at class time.    Education: Exercise & Equipment Safety: - Individual verbal instruction and demonstration of equipment use and safety with use of the equipment. Flowsheet Row Cardiac Rehab from  05/15/2024 in [facility] Cardiac and Pulmonary Rehab  Date 05/01/24  Educator MB  Instruction Review Code 1- Verbalizes Understanding    Education: Exercise Physiology & General Exercise Guidelines: - Group verbal and written instruction with models to review the exercise physiology of the cardiovascular system and associated critical values. Provides general exercise guidelines with specific guidelines to those with heart or lung disease. Written material provided at class time.   Education: Flexibility, Balance, Mind/Body Relaxation: - Group verbal and visual presentation with interactive activity on the components of exercise prescription. Introduces F.I.T.T principle from ACSM for exercise prescriptions. Reviews F.I.T.T. principles of flexibility and balance exercise training including progression. Also discusses the mind body connection.  Reviews various relaxation techniques to help reduce and manage stress (i.e. Deep breathing, progressive muscle relaxation, and visualization). Balance handout provided to take home. Written material provided at class time.   Activity Barriers & Risk Stratification:  Activity Barriers & Cardiac Risk Stratification - 05/01/24 1516       Activity Barriers & Cardiac Risk Stratification   Activity Barriers Other (comment)    Comments Right leg amputee, 5lb lifting restriction    Cardiac Risk Stratification Moderate          6 Minute Walk:  6 Minute Walk     Row Name 05/01/24 1515         6 Minute Walk   Phase Initial     Distance 860 feet     Walk Time 6 minutes     # of Rest Breaks 0     MPH 1.63     METS 1.63     RPE 8     Perceived Dyspnea  0  VO2 Peak 5.7     Symptoms No     Resting HR 92 bpm     Resting BP 130/70     Resting Oxygen Saturation  96 %     Exercise Oxygen Saturation  during 6 min walk 97 %     Max Ex. HR 115 bpm     Max Ex. BP 144/60     2 Minute Post BP 126/56        Oxygen Initial Assessment:   Oxygen  Re-Evaluation:   Oxygen Discharge (Final Oxygen Re-Evaluation):   Initial Exercise Prescription:  Initial Exercise Prescription - 05/01/24 1500       Date of Initial Exercise RX and Referring Provider   Date 05/01/24    Referring Provider Wilburn Fillers, MD      Oxygen   Maintain Oxygen Saturation 88% or higher      Recumbant Bike   Level 1    RPM 50    Watts 15    Minutes 15    METs 1.63      NuStep   Level 1    SPM 80    Minutes 15    METs 1.63      Arm Ergometer   Level 1    RPM 25    Minutes 15    METs 1.63      Biostep-RELP   Level 1    SPM 50    Minutes 15    METs 1.63      Track   Laps 20    Minutes 15    METs 2.09      Prescription Details   Frequency (times per week) 3    Duration Progress to 30 minutes of continuous aerobic without signs/symptoms of physical distress      Intensity   THRR 40-80% of Max Heartrate 112-132    Ratings of Perceived Exertion 11-13    Perceived Dyspnea 0-4      Progression   Progression Continue to progress workloads to maintain intensity without signs/symptoms of physical distress.      Resistance Training   Training Prescription Yes    Weight 3 lb    Reps 10-15          Perform Capillary Blood Glucose checks as needed.  Exercise Prescription Changes:   Exercise Prescription Changes     Row Name 05/01/24 1500             Response to Exercise   Blood Pressure (Admit) 130/70       Blood Pressure (Exercise) 144/60       Blood Pressure (Exit) 126/56       Heart Rate (Admit) 92 bpm       Heart Rate (Exercise) 115 bpm       Heart Rate (Exit) 96 bpm       Oxygen Saturation (Admit) 96 %       Oxygen Saturation (Exercise) 97 %       Oxygen Saturation (Exit) 97 %       Rating of Perceived Exertion (Exercise) 8       Perceived Dyspnea (Exercise) 0       Symptoms none       Comments results         Progression   Average METs 1.63          Exercise Comments:   Exercise Comments      Row Name 05/13/24 1128  Exercise Comments First full day of exercise!  Patient was oriented to gym and equipment including functions, settings, policies, and procedures.  Patient's individual exercise prescription and treatment plan were reviewed.  All starting workloads were established based on the results of the 6 minute walk test done at initial orientation visit.  The plan for exercise progression was also introduced and progression will be customized based on patient's performance and goals.          Exercise Goals and Review:   Exercise Goals     Row Name 05/01/24 1519             Exercise Goals   Increase Physical Activity Yes       Intervention Provide advice, education, support and counseling about physical activity/exercise needs.;Develop an individualized exercise prescription for aerobic and resistive training based on initial evaluation findings, risk stratification, comorbidities and participant's personal goals.       Expected Outcomes Short Term: Attend rehab on a regular basis to increase amount of physical activity.;Long Term: Add in home exercise to make exercise part of routine and to increase amount of physical activity.;Long Term: Exercising regularly at least 3-5 days a week.       Increase Strength and Stamina Yes       Intervention Provide advice, education, support and counseling about physical activity/exercise needs.;Develop an individualized exercise prescription for aerobic and resistive training based on initial evaluation findings, risk stratification, comorbidities and participant's personal goals.       Expected Outcomes Short Term: Increase workloads from initial exercise prescription for resistance, speed, and METs.;Short Term: Perform resistance training exercises routinely during rehab and add in resistance training at home;Long Term: Improve cardiorespiratory fitness, muscular endurance and strength as measured by increased METs and functional  capacity ( )       Able to understand and use rate of perceived exertion (RPE) scale Yes       Intervention Provide education and explanation on how to use RPE scale       Expected Outcomes Short Term: Able to use RPE daily in rehab to express subjective intensity level;Long Term:  Able to use RPE to guide intensity level when exercising independently       Able to understand and use Dyspnea scale Yes       Intervention Provide education and explanation on how to use Dyspnea scale       Expected Outcomes Short Term: Able to use Dyspnea scale daily in rehab to express subjective sense of shortness of breath during exertion;Long Term: Able to use Dyspnea scale to guide intensity level when exercising independently       Knowledge and understanding of Target Heart Rate Range (THRR) Yes       Intervention Provide education and explanation of THRR including how the numbers were predicted and where they are located for reference       Expected Outcomes Short Term: Able to state/look up THRR;Short Term: Able to use daily as guideline for intensity in rehab;Long Term: Able to use THRR to govern intensity when exercising independently       Able to check pulse independently Yes       Intervention Provide education and demonstration on how to check pulse in carotid and radial arteries.;Review the importance of being able to check your own pulse for safety during independent exercise       Expected Outcomes Short Term: Able to explain why pulse checking is important during independent exercise;Long Term: Able to  check pulse independently and accurately       Understanding of Exercise Prescription Yes       Intervention Provide education, explanation, and written materials on patient's individual exercise prescription       Expected Outcomes Short Term: Able to explain program exercise prescription;Long Term: Able to explain home exercise prescription to exercise independently          Exercise Goals  Re-Evaluation :  Exercise Goals Re-Evaluation     Row Name 05/13/24 1127             Exercise Goal Re-Evaluation   Exercise Goals Review Increase Physical Activity;Able to understand and use rate of perceived exertion (RPE) scale;Understanding of Exercise Prescription;Knowledge and understanding of Target Heart Rate Range (THRR);Increase Strength and Stamina;Able to understand and use Dyspnea scale;Able to check pulse independently       Comments Reviewed RPE and dyspnea scale, THR and program prescription with pt today.  Pt voiced understanding and was given a copy of goals to take home.       Expected Outcomes Short: Use RPE daily to regulate intensity. Long: Follow program prescription in THR.          Discharge Exercise Prescription (Final Exercise Prescription Changes):  Exercise Prescription Changes - 05/01/24 1500       Response to Exercise   Blood Pressure (Admit) 130/70    Blood Pressure (Exercise) 144/60    Blood Pressure (Exit) 126/56    Heart Rate (Admit) 92 bpm    Heart Rate (Exercise) 115 bpm    Heart Rate (Exit) 96 bpm    Oxygen Saturation (Admit) 96 %    Oxygen Saturation (Exercise) 97 %    Oxygen Saturation (Exit) 97 %    Rating of Perceived Exertion (Exercise) 8    Perceived Dyspnea (Exercise) 0    Symptoms none    Comments results      Progression   Average METs 1.63          Nutrition:  Target Goals: Understanding of nutrition guidelines, daily intake of sodium 1500mg , cholesterol 200mg , calories 30% from fat and 7% or less from saturated fats, daily to have 5 or more servings of fruits and vegetables.  Education: Nutrition 1 -Group instruction provided by verbal, written material, interactive activities, discussions, models, and posters to present general guidelines for heart healthy nutrition including macronutrients, label reading, and promoting whole foods over processed counterparts. Education serves as Pensions consultant of discussion of heart  healthy eating for all. Written material provided at class time.    Education: Nutrition 2 -Group instruction provided by verbal, written material, interactive activities, discussions, models, and posters to present general guidelines for heart healthy nutrition including sodium, cholesterol, and saturated fat. Providing guidance of habit forming to improve blood pressure, cholesterol, and body weight. Written material provided at class time. Flowsheet Row Cardiac Rehab from 05/15/2024 in [facility] Cardiac and Pulmonary Rehab  Date 05/15/24  Educator jg  Instruction Review Code 1- Verbalizes Understanding      Biometrics:  Pre Biometrics - 05/01/24 1520       Pre Biometrics   Height 5' 9.72 (1.771 m)    Weight 236 lb 8 oz (107.3 kg)    Waist Circumference 48 inches    Hip Circumference 47.9 inches    Waist to Hip Ratio 1 %    BMI (Calculated) 34.2    Single Leg Stand 4 seconds           Nutrition Therapy Plan  and Nutrition Goals:  Nutrition Therapy & Goals - 05/01/24 1524       Nutrition Therapy   Diet Cardiac, Low Na    Protein (specify units) 70-90    Fiber 30 grams    Whole Grain Foods 3 servings    Saturated Fats 15 max. grams    Fruits and Vegetables 5 servings/day    Sodium 2 grams      Personal Nutrition Goals   Nutrition Goal Eat 15-30gProtein and 30-60gCarbs at each meal.    Personal Goal #2 Read labels and reduce sodium intake to below 2300mg . Ideally 1500mg  per day.    Personal Goal #3 Reduce saturated fat, less than 12g per day. Replace bad fats for more heart healthy fats.    Comments Patient drinking mostly water, some cranberry juice in the mornings. He also likes whole milk. Spoke with him about saturated fat and sodium. He says he used to salt his food but knows its bad, so he stopped. Reviewed a facts labels and went over how to read labels with guideline limits of less than 12g saturated fat and less than 1500mg  sodium daily. Provided mediterranean diet  handout. Educated on types of fats, sources, and how to read labels. Brainstormed some small meals and snack ideas focusing on prioritizing protein without excessive sodium and saturated fat intake.      Intervention Plan   Intervention Prescribe, educate and counsel regarding individualized specific dietary modifications aiming towards targeted core components such as weight, hypertension, lipid management, diabetes, heart failure and other comorbidities.;Nutrition handout(s) given to patient.    Expected Outcomes Long Term Goal: Adherence to prescribed nutrition plan.;Short Term Goal: A plan has been developed with personal nutrition goals set during dietitian appointment.;Short Term Goal: Understand basic principles of dietary content, such as calories, fat, sodium, cholesterol and nutrients.          Nutrition Assessments:  MEDIFICTS Score Key: >=70 Need to make dietary changes  40-70 Heart Healthy Diet <= 40 Therapeutic Level Cholesterol Diet  Flowsheet Row Cardiac Rehab from 05/17/2024 in [facility] Cardiac and Pulmonary Rehab  Picture Your Plate Total Score on Admission 59   Picture Your Plate Scores: <59 Unhealthy dietary pattern with much room for improvement. 41-50 Dietary pattern unlikely to meet recommendations for good health and room for improvement. 51-60 More healthful dietary pattern, with some room for improvement.  >60 Healthy dietary pattern, although there may be some specific behaviors that could be improved.    Nutrition Goals Re-Evaluation:   Nutrition Goals Discharge (Final Nutrition Goals Re-Evaluation):   Psychosocial: Target Goals: Acknowledge presence or absence of significant depression and/or stress, maximize coping skills, provide positive support system. Participant is able to verbalize types and ability to use techniques and skills needed for reducing stress and depression.   Education: Stress, Anxiety, and Depression - Group verbal and visual  presentation to define topics covered.  Reviews how body is impacted by stress, anxiety, and depression.  Also discusses healthy ways to reduce stress and to treat/manage anxiety and depression. Written material provided at class time.   Education: Sleep Hygiene -Provides group verbal and written instruction about how sleep can affect your health.  Define sleep hygiene, discuss sleep cycles and impact of sleep habits. Review good sleep hygiene tips.   Initial Review & Psychosocial Screening:  Initial Psych Review & Screening - 04/29/24 1504       Initial Review   Current issues with None Identified      Family Dynamics  Good Support System? Yes    Comments He can look to his wife for support and states no mental instability.      Barriers   Psychosocial barriers to participate in program The patient should benefit from training in stress management and relaxation.;There are no identifiable barriers or psychosocial needs.      Screening Interventions   Interventions To provide support and resources with identified psychosocial needs;Provide feedback about the scores to participant;Encouraged to exercise    Expected Outcomes Short Term goal: Utilizing psychosocial counselor, staff and physician to assist with identification of specific Stressors or current issues interfering with healing process. Setting desired goal for each stressor or current issue identified.;Long Term Goal: Stressors or current issues are controlled or eliminated.;Short Term goal: Identification and review with participant of any Quality of Life or Depression concerns found by scoring the questionnaire.;Long Term goal: The participant improves quality of Life and PHQ9 Scores as seen by post scores and/or verbalization of changes          Quality of Life Scores:   Quality of Life - 05/17/24 1106       Quality of Life   Select Quality of Life      Quality of Life Scores   Health/Function Pre 28.8 %     Socioeconomic Pre 27 %    Psych/Spiritual Pre 30 %    Family Pre 30 %    GLOBAL Pre 28.8 %         Scores of 19 and below usually indicate a poorer quality of life in these areas.  A difference of  2-3 points is a clinically meaningful difference.  A difference of 2-3 points in the total score of the Quality of Life Index has been associated with significant improvement in overall quality of life, self-image, physical symptoms, and general health in studies assessing change in quality of life.  PHQ-9: Review Flowsheet  More data exists      05/01/2024 06/09/2022 05/04/2022 10/18/2021 06/08/2021  Depression screen PHQ 2/9  Decreased Interest 0 0 0 0 0  Down, Depressed, Hopeless 0 0 0 0 0  PHQ - 2 Score 0 0 0 0 0  Altered sleeping 3 0 - - 0  Tired, decreased energy 0 0 - - 0  Change in appetite 0 0 - - 0  Feeling bad or failure about yourself  0 0 - - 0  Trouble concentrating 0 0 - - 0  Moving slowly or fidgety/restless 0 0 - - 0  Suicidal thoughts 0 0 - - 0  PHQ-9 Score 3 0 - - 0  Difficult doing work/chores Not difficult at all Not difficult at all - - Not difficult at all   Interpretation of Total Score  Total Score Depression Severity:  1-4 = Minimal depression, 5-9 = Mild depression, 10-14 = Moderate depression, 15-19 = Moderately severe depression, 20-27 = Severe depression   Psychosocial Evaluation and Intervention:  Psychosocial Evaluation - 04/29/24 1505       Psychosocial Evaluation & Interventions   Interventions Encouraged to exercise with the program and follow exercise prescription;Relaxation education;Stress management education    Comments He can look to his wife for support and states no mental instability.    Expected Outcomes Short: Start [facility] to help with mood. Long: Maintain a healthy mental state    Continue Psychosocial Services  Follow up required by staff          Psychosocial Re-Evaluation:   Psychosocial Discharge (Final Psychosocial  Re-Evaluation):   Vocational Rehabilitation: Provide vocational rehab assistance to qualifying candidates.   Vocational Rehab Evaluation & Intervention:   Education: Education Goals: Education classes will be provided on a variety of topics geared toward better understanding of heart health and risk factor modification. Participant will state understanding/return demonstration of topics presented as noted by education test scores.  Learning Barriers/Preferences:  Learning Barriers/Preferences - 04/29/24 1504       Learning Barriers/Preferences   Learning Barriers --          General Cardiac Education Topics:  AED/CPR: - Group verbal and written instruction with the use of models to demonstrate the basic use of the AED with the basic ABC's of resuscitation.   Test and Procedures: - Group verbal and visual presentation and models provide information about basic cardiac anatomy and function. Reviews the testing methods done to diagnose heart disease and the outcomes of the test results. Describes the treatment choices: Medical Management, Angioplasty, or Coronary Bypass Surgery for treating various heart conditions including Myocardial Infarction, Angina, Valve Disease, and Cardiac Arrhythmias. Written material provided at class time.   Medication Safety: - Group verbal and visual instruction to review commonly prescribed medications for heart and lung disease. Reviews the medication, class of the drug, and side effects. Includes the steps to properly store meds and maintain the prescription regimen. Written material provided at class time.   Intimacy: - Group verbal instruction through game format to discuss how heart and lung disease can affect sexual intimacy. Written material provided at class time.   Know Your Numbers and Heart Failure: - Group verbal and visual instruction to discuss disease risk factors for cardiac and pulmonary disease and treatment options.  Reviews  associated critical values for Overweight/Obesity, Hypertension, Cholesterol, and Diabetes.  Discusses basics of heart failure: signs/symptoms and treatments.  Introduces Heart Failure Zone chart for action plan for heart failure. Written material provided at class time.   Infection Prevention: - Provides verbal and written material to individual with discussion of infection control including proper hand washing and proper equipment cleaning during exercise session. Flowsheet Row Cardiac Rehab from 05/15/2024 in [facility] Cardiac and Pulmonary Rehab  Date 05/01/24  Educator MB  Instruction Review Code 1- Verbalizes Understanding    Falls Prevention: - Provides verbal and written material to individual with discussion of falls prevention and safety. Flowsheet Row Cardiac Rehab from 05/15/2024 in [facility] Cardiac and Pulmonary Rehab  Date 05/01/24  Educator MB  Instruction Review Code 1- Verbalizes Understanding    Other: -Provides group and verbal instruction on various topics (see comments)   Knowledge Questionnaire Score:  Knowledge Questionnaire Score - 05/17/24 1107       Knowledge Questionnaire Score   Pre Score 23/26          Core Components/Risk Factors/Patient Goals at Admission:  Personal Goals and Risk Factors at Admission - 05/01/24 1521       Core Components/Risk Factors/Patient Goals on Admission    Weight Management Yes;Weight Loss    Intervention Weight Management: Develop a combined nutrition and exercise program designed to reach desired caloric intake, while maintaining appropriate intake of nutrient and fiber, sodium and fats, and appropriate energy expenditure required for the weight goal.;Weight Management: Provide education and appropriate resources to help participant work on and attain dietary goals.;Weight Management/Obesity: Establish reasonable short term and long term weight goals.;Obesity: Provide education and appropriate resources to help participant work on  and attain dietary goals.    Admit Weight 236 lb 8  oz (107.3 kg)    Goal Weight: Short Term 218 lb (98.9 kg)    Goal Weight: Long Term 200 lb (90.7 kg)    Expected Outcomes Short Term: Continue to assess and modify interventions until short term weight is achieved;Long Term: Adherence to nutrition and physical activity/exercise program aimed toward attainment of established weight goal;Weight Loss: Understanding of general recommendations for a balanced deficit meal plan, which promotes 1-2 lb weight loss per week and includes a negative energy balance of [phone] kcal/d;Understanding recommendations for meals to include 15-35% energy as protein, 25-35% energy from fat, 35-60% energy from carbohydrates, less than 200mg  of dietary cholesterol, 20-35 gm of total fiber daily;Understanding of distribution of calorie intake throughout the day with the consumption of 4-5 meals/snacks    Diabetes Yes    Intervention Provide education about signs/symptoms and action to take for hypo/hyperglycemia.;Provide education about proper nutrition, including hydration, and aerobic/resistive exercise prescription along with prescribed medications to achieve blood glucose in normal ranges: Fasting glucose 65-99 mg/dL    Expected Outcomes Short Term: Participant verbalizes understanding of the signs/symptoms and immediate care of hyper/hypoglycemia, proper foot care and importance of medication, aerobic/resistive exercise and nutrition plan for blood glucose control.;Long Term: Attainment of HbA1C < 7%.    Hypertension Yes    Intervention Provide education on lifestyle modifcations including regular physical activity/exercise, weight management, moderate sodium restriction and increased consumption of fresh fruit, vegetables, and low fat dairy, alcohol moderation, and smoking cessation.;Monitor prescription use compliance.    Expected Outcomes Short Term: Continued assessment and intervention until BP is < 140/24mm HG in  hypertensive participants. < 130/59mm HG in hypertensive participants with diabetes, heart failure or chronic kidney disease.;Long Term: Maintenance of blood pressure at goal levels.    Lipids Yes    Intervention Provide education and support for participant on nutrition & aerobic/resistive exercise along with prescribed medications to achieve LDL 70mg , HDL >40mg .    Expected Outcomes Short Term: Participant states understanding of desired cholesterol values and is compliant with medications prescribed. Participant is following exercise prescription and nutrition guidelines.;Long Term: Cholesterol controlled with medications as prescribed, with individualized exercise RX and with personalized nutrition plan. Value goals: LDL < 70mg , HDL > 40 mg.          Education:Diabetes - Individual verbal and written instruction to review signs/symptoms of diabetes, desired ranges of glucose level fasting, after meals and with exercise. Acknowledge that pre and post exercise glucose checks will be done for 3 sessions at entry of program. Flowsheet Row Cardiac Rehab from 05/15/2024 in [facility] Cardiac and Pulmonary Rehab  Date 05/01/24  Educator MB  Instruction Review Code 1- Verbalizes Understanding    Core Components/Risk Factors/Patient Goals Review:    Core Components/Risk Factors/Patient Goals at Discharge (Final Review):    ITP Comments:  ITP Comments     Row Name 04/29/24 1506 05/01/24 1515 05/13/24 1128 05/22/24 0753     ITP Comments Virtual Visit completed. Patient informed on EP and RD appointment and 6 Minute walk test. Patient also informed of patient health questionnaires on My Chart. Patient Verbalizes understanding. Visit diagnosis can be found in [facility] 04/09/2024. Completed and gym orientation for cardiac rehab. Initial ITP created and sent for review to Dr. Oneil Pinal, Medical Director. First full day of exercise!  Patient was oriented to gym and equipment including functions, settings,  policies, and procedures.  Patient's individual exercise prescription and treatment plan were reviewed.  All starting workloads were established based on the  results of the 6 minute walk test done at initial orientation visit.  The plan for exercise progression was also introduced and progression will be customized based on patient's performance and goals. 30 Day review completed. Medical Director ITP review done, changes made as directed, and signed approval by Medical Director. New to Program.       Comments: 30 day review

## 2024-05-22 NOTE — Progress Notes (Signed)
 Daily Session Note  Patient Details  Name: Andrew Lyons MRN: [id number] Date of Birth: 07/29/1946 Referring Provider:   Flowsheet Row Cardiac Rehab from 05/01/2024 in [facility] Cardiac and Pulmonary Rehab  Referring Provider Wilburn Fillers, MD    Encounter Date: 05/22/2024  Check In:  Session Check In - 05/22/24 1113       Check-In   Supervising physician immediately available to respond to emergencies See telemetry face sheet for immediately available ER MD    Location [facility]-Cardiac & Pulmonary Rehab    Staff Present Rollene Paterson, MS, Exercise Physiologist;Laureen Delores, BS, RRT, CPFT;Maxon Conetta BS, Exercise Physiologist;Zameer Borman RN,BSN    Virtual Visit No    Medication changes reported     No    Fall or balance concerns reported    No    Warm-up and Cool-down Performed on first and last piece of equipment    Resistance Training Performed Yes    VAD Patient? No    PAD/SET Patient? No      Pain Assessment   Currently in Pain? No/denies             Social History   Tobacco Use  Smoking Status Former   Current packs/day: 0.00   Types: Cigarettes   Quit date: 10/02/2001   Years since quitting: 22.6   Passive exposure: Past  Smokeless Tobacco Former  Tobacco Comments   Quit smoking in 2003; Started smoking at age 17, smoked about 40 years, smoked over 3 packs per day    Goals Met:  Independence with exercise equipment Exercise tolerated well No report of concerns or symptoms today Strength training completed today  Goals Unmet:  Not Applicable  Comments: Pt able to follow exercise prescription today without complaint.  Will continue to monitor for progression.    Dr. Oneil Pinal is Medical Director for [facility] Cardiac Rehabilitation.  Dr. Fuad Aleskerov is Medical Director for [facility] Pulmonary Rehabilitation.

## 2024-05-24 ENCOUNTER — Encounter

## 2024-05-24 DIAGNOSIS — Z955 Presence of coronary angioplasty implant and graft: Secondary | ICD-10-CM

## 2024-05-24 DIAGNOSIS — Z48812 Encounter for surgical aftercare following surgery on the circulatory system: Secondary | ICD-10-CM | POA: Diagnosis not present

## 2024-05-24 NOTE — Progress Notes (Signed)
 Daily Session Note  Patient Details  Name: Andrew Lyons MRN: [id number] Date of Birth: Jun 17, 1946 Referring Provider:   Flowsheet Row Cardiac Rehab from 05/01/2024 in [facility] Cardiac and Pulmonary Rehab  Referring Provider Wilburn Fillers, MD    Encounter Date: 05/24/2024  Check In:  Session Check In - 05/24/24 1104       Check-In   Supervising physician immediately available to respond to emergencies See telemetry face sheet for immediately available ER MD    Location [facility]-Cardiac & Pulmonary Rehab    Staff Present Burnard Davenport RN,BSN,MPA;Joseph Rolinda NORWOOD HARMAN Verlie Laird, [place], Exercise Physiologist    Virtual Visit No    Medication changes reported     No    Fall or balance concerns reported    No    Warm-up and Cool-down Performed on first and last piece of equipment    Resistance Training Performed Yes    VAD Patient? No    PAD/SET Patient? No      Pain Assessment   Currently in Pain? No/denies             Social History   Tobacco Use  Smoking Status Former   Current packs/day: 0.00   Types: Cigarettes   Quit date: 10/02/2001   Years since quitting: 22.6   Passive exposure: Past  Smokeless Tobacco Former  Tobacco Comments   Quit smoking in 2003; Started smoking at age 54, smoked about 40 years, smoked over 3 packs per day    Goals Met:  Independence with exercise equipment Exercise tolerated well No report of concerns or symptoms today Strength training completed today  Goals Unmet:  Not Applicable  Comments: Pt able to follow exercise prescription today without complaint.  Will continue to monitor for progression.    Dr. Oneil Pinal is Medical Director for [facility] Cardiac Rehabilitation.  Dr. Fuad Aleskerov is Medical Director for [facility] Pulmonary Rehabilitation.

## 2024-05-27 HISTORY — PX: STENT PLACE LEFT URETER (ARMC HX): HXRAD1254

## 2024-05-29 ENCOUNTER — Encounter: Attending: Cardiology | Admitting: *Deleted

## 2024-05-29 DIAGNOSIS — Z955 Presence of coronary angioplasty implant and graft: Secondary | ICD-10-CM | POA: Insufficient documentation

## 2024-05-29 NOTE — Progress Notes (Signed)
 Daily Session Note  Patient Details  Name: Andrew Lyons MRN: [id number] Date of Birth: [birth year] Referring Provider:   Flowsheet Row Cardiac Rehab from 05/01/2024 in [facility] Cardiac and Pulmonary Rehab  Referring Provider Wilburn Fillers, MD    Encounter Date: 05/29/2024  Check In:  Session Check In - 05/29/24 1031       Check-In   Supervising physician immediately available to respond to emergencies See telemetry face sheet for immediately available ER MD    Staff Present Hoy Rodney RN,BSN;Joseph Rolinda RCP,RRT,BSRT;Laura Cates RN,BSN;Kelly Dyane BS, ACSM CEP, Exercise Physiologist    Virtual Visit No    Medication changes reported     No    Fall or balance concerns reported    No    Warm-up and Cool-down Performed on first and last piece of equipment    Resistance Training Performed Yes    VAD Patient? No    PAD/SET Patient? No      Pain Assessment   Currently in Pain? No/denies             Social History   Tobacco Use  Smoking Status Former   Current packs/day: 0.00   Types: Cigarettes   Quit date: 10/02/2001   Years since quitting: 22.6   Passive exposure: Past  Smokeless Tobacco Former  Tobacco Comments   Quit smoking in 2003; Started smoking at age 17, smoked about 40 years, smoked over 3 packs per day    Goals Met:  Independence with exercise equipment Exercise tolerated well No report of concerns or symptoms today Strength training completed today  Goals Unmet:  Not Applicable  Comments: Pt able to follow exercise prescription today without complaint.  Will continue to monitor for progression.    Dr. Oneil Pinal is Medical Director for [facility] Cardiac Rehabilitation.  Dr. Fuad Aleskerov is Medical Director for [facility] Pulmonary Rehabilitation.

## 2024-05-31 ENCOUNTER — Encounter: Admitting: *Deleted

## 2024-05-31 DIAGNOSIS — Z955 Presence of coronary angioplasty implant and graft: Secondary | ICD-10-CM

## 2024-05-31 NOTE — Progress Notes (Signed)
 Daily Session Note  Patient Details  Name: Andrew Lyons MRN: [id number] Date of Birth: 1946-04-08 Referring Provider:   Flowsheet Row Cardiac Rehab from 05/01/2024 in [facility] Cardiac and Pulmonary Rehab  Referring Provider Wilburn Fillers, MD    Encounter Date: 05/31/2024  Check In:  Session Check In - 05/31/24 1051       Check-In   Supervising physician immediately available to respond to emergencies See telemetry face sheet for immediately available ER MD    Location [facility]-Cardiac & Pulmonary Rehab    Staff Present Hoy Rodney RN,BSN;Noah Tickle, BS, Exercise Physiologist;Maxon Conetta BS, Exercise Physiologist    Virtual Visit No    Medication changes reported     No    Fall or balance concerns reported    No    Warm-up and Cool-down Performed on first and last piece of equipment    Resistance Training Performed Yes    VAD Patient? No    PAD/SET Patient? No      Pain Assessment   Currently in Pain? No/denies             Social History   Tobacco Use  Smoking Status Former   Current packs/day: 0.00   Types: Cigarettes   Quit date: 10/02/2001   Years since quitting: 22.6   Passive exposure: Past  Smokeless Tobacco Former  Tobacco Comments   Quit smoking in 2003; Started smoking at age 10, smoked about 40 years, smoked over 3 packs per day    Goals Met:  Independence with exercise equipment Exercise tolerated well No report of concerns or symptoms today Strength training completed today  Goals Unmet:  Not Applicable  Comments: Pt able to follow exercise prescription today without complaint.  Will continue to monitor for progression.    Dr. Oneil Pinal is Medical Director for [facility] Cardiac Rehabilitation.  Dr. Fuad Aleskerov is Medical Director for [facility] Pulmonary Rehabilitation.

## 2024-06-03 ENCOUNTER — Encounter

## 2024-06-03 DIAGNOSIS — Z955 Presence of coronary angioplasty implant and graft: Secondary | ICD-10-CM

## 2024-06-03 NOTE — Progress Notes (Signed)
 Daily Session Note  Patient Details  Name: Andrew Lyons MRN: [id number] Date of Birth: [birth year] Referring Provider:   Flowsheet Row Cardiac Rehab from 05/01/2024 in [facility] Cardiac and Pulmonary Rehab  Referring Provider Wilburn Fillers, MD    Encounter Date: 06/03/2024  Check In:  Session Check In - 06/03/24 1116       Check-In   Supervising physician immediately available to respond to emergencies See telemetry face sheet for immediately available ER MD    Location [facility]-Cardiac & Pulmonary Rehab    Staff Present Burnard Davenport RN,BSN,MPA;Joseph [facility] Dyane BS, ACSM CEP, Exercise Physiologist;Laura Cates RN,BSN    Virtual Visit No    Medication changes reported     No    Fall or balance concerns reported    No    Warm-up and Cool-down Performed on first and last piece of equipment    Resistance Training Performed Yes    VAD Patient? No    PAD/SET Patient? No      Pain Assessment   Currently in Pain? No/denies             Social History   Tobacco Use  Smoking Status Former   Current packs/day: 0.00   Types: Cigarettes   Quit date: 10/02/2001   Years since quitting: 22.6   Passive exposure: Past  Smokeless Tobacco Former  Tobacco Comments   Quit smoking in 2003; Started smoking at age 34, smoked about 40 years, smoked over 3 packs per day    Goals Met:  Independence with exercise equipment Exercise tolerated well No report of concerns or symptoms today Strength training completed today  Goals Unmet:  Not Applicable  Comments: Pt able to follow exercise prescription today without complaint.  Will continue to monitor for progression.    Dr. Oneil Pinal is Medical Director for [facility] Cardiac Rehabilitation.  Dr. Fuad Aleskerov is Medical Director for [facility] Pulmonary Rehabilitation.

## 2024-06-05 ENCOUNTER — Encounter: Admitting: Emergency Medicine

## 2024-06-05 ENCOUNTER — Ambulatory Visit: Admitting: Urology

## 2024-06-05 DIAGNOSIS — Z955 Presence of coronary angioplasty implant and graft: Secondary | ICD-10-CM

## 2024-06-05 NOTE — Progress Notes (Signed)
 Daily Session Note  Patient Details  Name: URIYAH RASKA MRN: [id number] Date of Birth: August 11, 1946 Referring Provider:   Flowsheet Row Cardiac Rehab from 05/01/2024 in [facility] Cardiac and Pulmonary Rehab  Referring Provider Wilburn Fillers, MD    Encounter Date: 06/05/2024  Check In:  Session Check In - 06/05/24 1102       Check-In   Supervising physician immediately available to respond to emergencies See telemetry face sheet for immediately available ER MD    Location [facility]-Cardiac & Pulmonary Rehab    Staff Present Rollene Paterson, MS, Exercise Physiologist;Maxon Conetta BS, Exercise Physiologist;Joseph Rolinda RCP,RRT,BSRT;Sheena Donegan RN,BSN    Virtual Visit No    Medication changes reported     No    Fall or balance concerns reported    No    Warm-up and Cool-down Performed on first and last piece of equipment    Resistance Training Performed Yes    VAD Patient? No    PAD/SET Patient? No      Pain Assessment   Currently in Pain? No/denies             Social History   Tobacco Use  Smoking Status Former   Current packs/day: 0.00   Types: Cigarettes   Quit date: 10/02/2001   Years since quitting: 22.6   Passive exposure: Past  Smokeless Tobacco Former  Tobacco Comments   Quit smoking in 2003; Started smoking at age 73, smoked about 40 years, smoked over 3 packs per day    Goals Met:  Independence with exercise equipment Exercise tolerated well No report of concerns or symptoms today Strength training completed today  Goals Unmet:  Not Applicable  Comments: Pt able to follow exercise prescription today without complaint.  Will continue to monitor for progression.    Dr. Oneil Pinal is Medical Director for [facility] Cardiac Rehabilitation.  Dr. Fuad Aleskerov is Medical Director for [facility] Pulmonary Rehabilitation.

## 2024-06-06 ENCOUNTER — Ambulatory Visit: Admitting: Urology

## 2024-06-07 ENCOUNTER — Encounter

## 2024-06-07 DIAGNOSIS — Z955 Presence of coronary angioplasty implant and graft: Secondary | ICD-10-CM | POA: Diagnosis not present

## 2024-06-07 NOTE — Progress Notes (Signed)
 Daily Session Note  Patient Details  Name: Andrew Lyons MRN: [id number] Date of Birth: Aug 12, 1946 Referring Provider:   Flowsheet Row Cardiac Rehab from 05/01/2024 in [facility] Cardiac and Pulmonary Rehab  Referring Provider Wilburn Fillers, MD    Encounter Date: 06/07/2024  Check In:  Session Check In - 06/07/24 1048       Check-In   Supervising physician immediately available to respond to emergencies See telemetry face sheet for immediately available ER MD    Location [facility]-Cardiac & Pulmonary Rehab    Staff Present Burnard Davenport RN,BSN,MPA;Joseph Hood RCP,RRT,BSRT;Maxon Conetta BS, Exercise Physiologist;Noah Tickle, BS, Exercise Physiologist    Virtual Visit No    Medication changes reported     No    Fall or balance concerns reported    No    Warm-up and Cool-down Performed on first and last piece of equipment    Resistance Training Performed Yes    VAD Patient? No    PAD/SET Patient? No      Pain Assessment   Currently in Pain? No/denies             Social History   Tobacco Use  Smoking Status Former   Current packs/day: 0.00   Types: Cigarettes   Quit date: 10/02/2001   Years since quitting: 22.6   Passive exposure: Past  Smokeless Tobacco Former  Tobacco Comments   Quit smoking in 2003; Started smoking at age 89, smoked about 40 years, smoked over 3 packs per day    Goals Met:  Independence with exercise equipment Exercise tolerated well No report of concerns or symptoms today Strength training completed today  Goals Unmet:  Not Applicable  Comments: Pt able to follow exercise prescription today without complaint.  Will continue to monitor for progression.    Dr. Oneil Pinal is Medical Director for [facility] Cardiac Rehabilitation.  Dr. Fuad Aleskerov is Medical Director for [facility] Pulmonary Rehabilitation.

## 2024-06-10 ENCOUNTER — Encounter

## 2024-06-10 DIAGNOSIS — Z955 Presence of coronary angioplasty implant and graft: Secondary | ICD-10-CM | POA: Diagnosis not present

## 2024-06-10 NOTE — Progress Notes (Signed)
 Daily Session Note  Patient Details  Name: Andrew Lyons MRN: [id number] Date of Birth: [birth year] Referring Provider:   Flowsheet Row Cardiac Rehab from 05/01/2024 in [facility] Cardiac and Pulmonary Rehab  Referring Provider Wilburn Fillers, MD    Encounter Date: 06/10/2024  Check In:  Session Check In - 06/10/24 1111       Check-In   Supervising physician immediately available to respond to emergencies See telemetry face sheet for immediately available ER MD    Location [facility]-Cardiac & Pulmonary Rehab    Staff Present Burnard Davenport RN,BSN,MPA;Joseph Rolinda RCP,RRT,BSRT;Laura Cates RN,BSN;Alayja Armas Dyane BS, ACSM CEP, Exercise Physiologist    Virtual Visit No    Medication changes reported     No    Fall or balance concerns reported    No    Warm-up and Cool-down Performed on first and last piece of equipment    Resistance Training Performed Yes    VAD Patient? No    PAD/SET Patient? No      Pain Assessment   Currently in Pain? No/denies             Social History   Tobacco Use  Smoking Status Former   Current packs/day: 0.00   Types: Cigarettes   Quit date: 10/02/2001   Years since quitting: 22.7   Passive exposure: Past  Smokeless Tobacco Former  Tobacco Comments   Quit smoking in 2003; Started smoking at age 72, smoked about 40 years, smoked over 3 packs per day    Goals Met:  Independence with exercise equipment Exercise tolerated well No report of concerns or symptoms today Strength training completed today  Goals Unmet:  Not Applicable  Comments: Pt able to follow exercise prescription today without complaint.  Will continue to monitor for progression.    Dr. Oneil Pinal is Medical Director for [facility] Cardiac Rehabilitation.  Dr. Fuad Aleskerov is Medical Director for [facility] Pulmonary Rehabilitation.

## 2024-06-12 ENCOUNTER — Encounter

## 2024-06-12 DIAGNOSIS — Z955 Presence of coronary angioplasty implant and graft: Secondary | ICD-10-CM

## 2024-06-12 NOTE — Progress Notes (Signed)
 Daily Session Note  Patient Details  Name: Andrew Lyons MRN: [id number] Date of Birth: [birth year] Referring Provider:   Flowsheet Row Cardiac Rehab from 05/01/2024 in [facility] Cardiac and Pulmonary Rehab  Referring Provider Wilburn Fillers, MD    Encounter Date: 06/12/2024  Check In:  Session Check In - 06/12/24 1045       Check-In   Supervising physician immediately available to respond to emergencies See telemetry face sheet for immediately available ER MD    Location [facility]-Cardiac & Pulmonary Rehab    Staff Present Burnard Davenport RN,BSN,MPA;Joseph [facility] RCP,RRT,BSRT;Margaret Best, MS, Exercise Physiologist;Jason Elnor RDN,LDN    Virtual Visit No    Medication changes reported     No    Fall or balance concerns reported    No    Warm-up and Cool-down Performed on first and last piece of equipment    Resistance Training Performed Yes    VAD Patient? No    PAD/SET Patient? No      Pain Assessment   Currently in Pain? No/denies             Social History   Tobacco Use  Smoking Status Former   Current packs/day: 0.00   Types: Cigarettes   Quit date: 10/02/2001   Years since quitting: 77   Passive exposure: Past  Smokeless Tobacco Former  Tobacco Comments   Quit smoking in 2003; Started smoking at age 78, smoked about 40 years, smoked over 3 packs per day    Goals Met:  Independence with exercise equipment Exercise tolerated well No report of concerns or symptoms today Strength training completed today  Goals Unmet:  Not Applicable  Comments: Pt able to follow exercise prescription today without complaint.  Will continue to monitor for progression.    Dr. Oneil Pinal is Medical Director for [facility] Cardiac Rehabilitation.  Dr. Fuad Aleskerov is Medical Director for [facility] Pulmonary Rehabilitation.

## 2024-06-14 ENCOUNTER — Encounter: Admitting: Emergency Medicine

## 2024-06-14 DIAGNOSIS — Z955 Presence of coronary angioplasty implant and graft: Secondary | ICD-10-CM | POA: Diagnosis not present

## 2024-06-14 NOTE — Progress Notes (Signed)
 Daily Session Note  Patient Details  Name: Andrew Lyons MRN: [id number] Date of Birth: 22-Dec-1945 Referring Provider:   Flowsheet Row Cardiac Rehab from 05/01/2024 in [facility] Cardiac and Pulmonary Rehab  Referring Provider Wilburn Fillers, MD    Encounter Date: 06/14/2024  Check In:  Session Check In - 06/14/24 1106       Check-In   Supervising physician immediately available to respond to emergencies See telemetry face sheet for immediately available ER MD    Location [facility]-Cardiac & Pulmonary Rehab    Staff Present Devaughn Jaeger, BS, Exercise Physiologist;Margaret Best, MS, Exercise Physiologist;Nevaeh Casillas RN,BSN;Joseph Rolinda RCP,RRT,BSRT    Virtual Visit No    Medication changes reported     No    Fall or balance concerns reported    No    Warm-up and Cool-down Performed on first and last piece of equipment    Resistance Training Performed Yes    VAD Patient? No    PAD/SET Patient? No      Pain Assessment   Currently in Pain? No/denies             Social History   Tobacco Use  Smoking Status Former   Current packs/day: 0.00   Types: Cigarettes   Quit date: 10/02/2001   Years since quitting: 22.7   Passive exposure: Past  Smokeless Tobacco Former  Tobacco Comments   Quit smoking in 2003; Started smoking at age 13, smoked about 40 years, smoked over 3 packs per day    Goals Met:  Independence with exercise equipment Exercise tolerated well No report of concerns or symptoms today Strength training completed today  Goals Unmet:  Not Applicable  Comments: Pt able to follow exercise prescription today without complaint.  Will continue to monitor for progression.    Dr. Oneil Pinal is Medical Director for [facility] Cardiac Rehabilitation.  Dr. Fuad Aleskerov is Medical Director for [facility] Pulmonary Rehabilitation.

## 2024-06-17 ENCOUNTER — Encounter

## 2024-06-17 DIAGNOSIS — Z955 Presence of coronary angioplasty implant and graft: Secondary | ICD-10-CM

## 2024-06-17 NOTE — Progress Notes (Signed)
 Daily Session Note  Patient Details  Name: Andrew Lyons MRN: [id number] Date of Birth: March 30, 1946 Referring Provider:   Flowsheet Row Cardiac Rehab from 05/01/2024 in [facility] Cardiac and Pulmonary Rehab  Referring Provider Wilburn Fillers, MD    Encounter Date: 06/17/2024  Check In:  Session Check In - 06/17/24 1143       Check-In   Supervising physician immediately available to respond to emergencies See telemetry face sheet for immediately available ER MD    Location [facility]-Cardiac & Pulmonary Rehab    Staff Present Burnard Davenport RN,BSN,MPA;Joseph Rolinda RCP,RRT,BSRT;Laura Cates RN,BSN;Camyla Camposano Dyane BS, ACSM CEP, Exercise Physiologist    Virtual Visit No    Medication changes reported     No    Fall or balance concerns reported    No    Warm-up and Cool-down Performed on first and last piece of equipment    Resistance Training Performed Yes    VAD Patient? No    PAD/SET Patient? No      Pain Assessment   Currently in Pain? No/denies             Social History   Tobacco Use  Smoking Status Former   Current packs/day: 0.00   Types: Cigarettes   Quit date: 10/02/2001   Years since quitting: 22.7   Passive exposure: Past  Smokeless Tobacco Former  Tobacco Comments   Quit smoking in 2003; Started smoking at age 48, smoked about 40 years, smoked over 3 packs per day    Goals Met:  Independence with exercise equipment Exercise tolerated well No report of concerns or symptoms today Strength training completed today  Goals Unmet:  Not Applicable  Comments: Pt able to follow exercise prescription today without complaint.  Will continue to monitor for progression.    Dr. Oneil Pinal is Medical Director for [facility] Cardiac Rehabilitation.  Dr. Fuad Aleskerov is Medical Director for [facility] Pulmonary Rehabilitation.

## 2024-06-19 ENCOUNTER — Encounter: Payer: Self-pay | Admitting: *Deleted

## 2024-06-19 ENCOUNTER — Encounter

## 2024-06-19 DIAGNOSIS — Z955 Presence of coronary angioplasty implant and graft: Secondary | ICD-10-CM

## 2024-06-19 NOTE — Progress Notes (Signed)
 Cardiac Individual Treatment Plan  Patient Details  Name: Andrew Lyons MRN: [id number] Date of Birth: [birth year] Referring Provider:   Flowsheet Row Cardiac Rehab from 05/01/2024 in [facility] Cardiac and Pulmonary Rehab  Referring Provider Wilburn Fillers, MD    Initial Encounter Date:  Flowsheet Row Cardiac Rehab from 05/01/2024 in [facility] Cardiac and Pulmonary Rehab  Date 05/01/24    Visit Diagnosis: Status post coronary artery stent placement  Patient's Home Medications on Admission:  Current Outpatient Medications:    amLODipine  (NORVASC ) 5 MG tablet, Take 5 mg by mouth., Disp: , Rfl:    amLODipine -benazepril  (LOTREL) 5-40 MG capsule, Take 1 capsule by mouth daily. (Patient not taking: Reported on 04/29/2024), Disp: 90 capsule, Rfl: 1   aspirin  EC 81 MG tablet, Take 81 mg by mouth., Disp: , Rfl:    cholecalciferol  (VITAMIN D ) 25 MCG (1000 UT) tablet, Take 1,000 Units by mouth daily. , Disp: , Rfl:    clopidogrel  (PLAVIX ) 75 MG tablet, TAKE 1 TABLET BY MOUTH ONCE A DAY, Disp: 90 tablet, Rfl: 0   Coenzyme Q10 100 MG TABS, Take 100 mg by mouth daily. , Disp: , Rfl:    fluticasone  (FLONASE ) 50 MCG/ACT nasal spray, USE 2 SPRAYS INTO EACH NOSTRIL EVERY DAY, Disp: 48 g, Rfl: 5   JARDIANCE  25 MG TABS tablet, TAKE 1 TABLET BY MOUTH ONCE DAILY, Disp: 90 tablet, Rfl: 3   Lancets MISC, 1 each., Disp: , Rfl:    metFORMIN  (GLUCOPHAGE ) 500 MG tablet, TAKE 1 TABLET BY MOUTH TWICE A DAY WITH A MEAL, Disp: 180 tablet, Rfl: 1   metoprolol  succinate (TOPROL -XL) 100 MG 24 hr tablet, TAKE 1 TABLET BY MOUTH ONCE A DAY WITH OR IMMEDIATELY FOLLOWING A MEAL, Disp: 90 tablet, Rfl: 3   montelukast  (SINGULAIR ) 10 MG tablet, TAKE ONE TABLET BY MOUTH DAILY, Disp: 90 tablet, Rfl: 3   nitroGLYCERIN  (NITROSTAT ) 0.4 MG SL tablet, Place under the tongue., Disp: , Rfl:    omeprazole  (PRILOSEC) 40 MG capsule, TAKE ONE CAPSULE BY MOUTH DAILY, Disp: 90 capsule, Rfl: 3   polyethylene glycol (MIRALAX / GLYCOLAX) 17 g packet, Take  17 g by mouth., Disp: , Rfl:    predniSONE  (DELTASONE ) 10 MG tablet, Take by mouth., Disp: , Rfl:    predniSONE  (DELTASONE ) 50 MG tablet, SMARTSIG:1 Tablet(s) By Mouth Every Evening, Disp: , Rfl:    rosuvastatin  (CRESTOR ) 20 MG tablet, TAKE 1 TABLET BY MOUTH ONCE A DAY (Patient not taking: Reported on 04/29/2024), Disp: 90 tablet, Rfl: 3   rosuvastatin  (CRESTOR ) 20 MG tablet, Take 20 mg by mouth., Disp: , Rfl:    senna-docusate (SENOKOT-S) 8.6-50 MG tablet, Take 1 tablet by mouth., Disp: , Rfl:    sildenafil (VIAGRA) 100 MG tablet, Take 100 mg by mouth as needed for erectile dysfunction., Disp: , Rfl:    tirzepatide  (MOUNJARO ) 7.5 MG/0.5ML Pen, Inject 7.5 mg into the skin once a week., Disp: 6 mL, Rfl: 1   vitamin B-12 (CYANOCOBALAMIN ) 1000 MCG tablet, Take 1,000 mcg by mouth daily., Disp: , Rfl:   Past Medical History: Past Medical History:  Diagnosis Date   Adenomatous polyps    Aortic atherosclerosis    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    a.) severe; s/p AVR  03/29/2011   Arthritis    Barrett's esophagus with dysplasia    Bilateral carotid artery disease    BPH (benign prostatic hyperplasia)    Cataract    COPD (chronic obstructive pulmonary disease) (HCC)  Coronary artery disease 02/12/2002   a.) LHC 02/12/02: 40 pRCA, 80/90 mRCA, 40 dRCA, 50/70 RPDA, 20 pLCx, 20 mLCx, 30 OM1, 40/60 mLAD -> Tx to [facility]; b.) comp PCI 02/13/02: 2.5 x 13mm DES (unk type) to p-mLAD, 2.25 x 7mm BiodivYsio DES to RPDA, 2.25 x 23mm + 3.0 x 18mm + 3.0 x 13mm (unk type); c.) LHC 02/14/11: 25 pLAD-1, 10 ISR pLAD-2, 30 mLAD, 30 mLCx, 30 pRCA, 40 mRCA, 30 dRCA - med mgmt; c.) MV 10/29/19: no isch; d.) MV 05/03/21: no isch   Diastolic dysfunction    a.) TTE 04/05/2017: EF >55%, mild LVH, G1DD, midl LAE, mild MR/TR/PR, biopros AoV (MPG 10); TTE 12/04/2019: EF >55%, mild LVH, G1DD, mild BAE, mild RVE, triv TR/PR, mild MR, biopros AoV (MPG 12); c.) TTE 04/30/2021: EF >55%, mild LVH, G1DD, mod LAE, triv MR/TR,  biopros AoV;  d.) TTE 07/24/2023: EF >55%, G1DD, triv panval regurgitation, mild AS (AVA = 0.8 cm2)   ED (erectile dysfunction)    a.) on PDE5i (tadalafil )   GERD (gastroesophageal reflux disease)    History of 2019 novel coronavirus disease (COVID-19)    Hx of BKA, right (HCC)    Hx of dysplastic nevus 10/19/2006   L upper back, medial to mid scapula, slight to moderate atypia   Hx of dysplastic nevus 03/18/2008   R upper back 4.0cm lat to spine, moderate to marked atypia, excised 04/15/2008   Hx of dysplastic nevus 09/22/2008   R mid back 5.0cm lat to spine, moderate atypia   Hx of dysplastic nevus 04/06/2009   L mid to low back 11.0cm lat to spine, mild atypia   Hyperlipidemia    Hypertension    Internal hemorrhoids    Long term current use of clopidogrel     NSTEMI (non-ST elevated myocardial infarction) (HCC) 02/12/2002   a.) LHC 02/12/2002: complex multi-vessel CAD--> transferred to [facility]; b.) complex PCI 02/13/2002: 2.5 x 13 mm DES (unknown type) to p-mLAD, 2.25 x 7 mm BiodivYsio DES to RPDA, serial 2.25 x 23 mm + 3.0 x 18 mm + 3.0 x 13 mm to mRCA (unknown type)   OAB (overactive bladder)    OSA on CPAP    Peripheral vascular disease    a.) s/p RIGHT BKA   Presence of stent in coronary artery    a.) TOTAL of 5 as of 07/27/2023 (p-mLAD x1, RPDA x 1, mRCA x 3)   S/P AVR (aortic valve replacement) 03/29/2011   a.) 25 mm Magna pericardial tissue bioprosthetic valve   Seizures (HCC)    Statin induced/mediated hepatitis    T2DM (type 2 diabetes mellitus) (HCC)     Tobacco Use: Social History   Tobacco Use  Smoking Status Former   Current packs/day: 0.00   Types: Cigarettes   Quit date: 10/02/2001   Years since quitting: 22.7   Passive exposure: Past  Smokeless Tobacco Former  Tobacco Comments   Quit smoking in 2003; Started smoking at age 89, smoked about 40 years, smoked over 3 packs per day    Labs: Review Flowsheet  More data exists      Latest Ref Rng & Units  06/09/2021 11/29/2021 04/13/2022 08/10/2022 03/11/2024  Labs for ITP Cardiac and Pulmonary Rehab  Cholestrol 100 - 199 mg/dL 841  - 862  - -  LDL (calc) 0 - 99 mg/dL 74  - 69  - -  HDL-C >60 mg/dL 33  - 32  - -  Trlycerides 0 - 149 mg/dL 683  -  219  - -  Hemoglobin A1c 4.0 - 5.6 % 7.2  7.2  7.7  8.0  -  PH, Arterial 7.35 - 7.45 - - - - 7.358   PCO2 arterial 32 - 48 mmHg - - - - 37.2   Bicarbonate 20.0 - 28.0 mmol/L - - - - 20.9  23.8   TCO2 22 - 32 mmol/L - - - - 22  25   Acid-base deficit 0.0 - 2.0 mmol/L - - - - 4.0  2.0   O2 Saturation % - - - - 91  60     Details       Multiple values from one day are sorted in reverse-chronological order          Exercise Target Goals: Exercise Program Goal: Individual exercise prescription set using results from initial 6 min walk test and THRR while considering  patient's activity barriers and safety.   Exercise Prescription Goal: Initial exercise prescription builds to 30-45 minutes a day of aerobic activity, 2-3 days per week.  Home exercise guidelines will be given to patient during program as part of exercise prescription that the participant will acknowledge.   Education: Aerobic Exercise: - Group verbal and visual presentation on the components of exercise prescription. Introduces F.I.T.T principle from ACSM for exercise prescriptions.  Reviews F.I.T.T. principles of aerobic exercise including progression. Written material provided at class time.   Education: Resistance Exercise: - Group verbal and visual presentation on the components of exercise prescription. Introduces F.I.T.T principle from ACSM for exercise prescriptions  Reviews F.I.T.T. principles of resistance exercise including progression. Written material provided at class time.    Education: Exercise & Equipment Safety: - Individual verbal instruction and demonstration of equipment use and safety with use of the equipment. Flowsheet Row Cardiac Rehab from 06/12/2024 in  [facility] Cardiac and Pulmonary Rehab  Date 05/01/24  Educator MB  Instruction Review Code 1- Verbalizes Understanding    Education: Exercise Physiology & General Exercise Guidelines: - Group verbal and written instruction with models to review the exercise physiology of the cardiovascular system and associated critical values. Provides general exercise guidelines with specific guidelines to those with heart or lung disease. Written material provided at class time.   Education: Flexibility, Balance, Mind/Body Relaxation: - Group verbal and visual presentation with interactive activity on the components of exercise prescription. Introduces F.I.T.T principle from ACSM for exercise prescriptions. Reviews F.I.T.T. principles of flexibility and balance exercise training including progression. Also discusses the mind body connection.  Reviews various relaxation techniques to help reduce and manage stress (i.e. Deep breathing, progressive muscle relaxation, and visualization). Balance handout provided to take home. Written material provided at class time.   Activity Barriers & Risk Stratification:  Activity Barriers & Cardiac Risk Stratification - 05/01/24 1516       Activity Barriers & Cardiac Risk Stratification   Activity Barriers Other (comment)    Comments Right leg amputee, 5lb lifting restriction    Cardiac Risk Stratification Moderate          6 Minute Walk:  6 Minute Walk     Row Name 05/01/24 1515         6 Minute Walk   Phase Initial     Distance 860 feet     Walk Time 6 minutes     # of Rest Breaks 0     MPH 1.63     METS 1.63     RPE 8     Perceived Dyspnea  0  VO2 Peak 5.7     Symptoms No     Resting HR 92 bpm     Resting BP 130/70     Resting Oxygen Saturation  96 %     Exercise Oxygen Saturation  during 6 min walk 97 %     Max Ex. HR 115 bpm     Max Ex. BP 144/60     2 Minute Post BP 126/56        Oxygen Initial Assessment:   Oxygen  Re-Evaluation:   Oxygen Discharge (Final Oxygen Re-Evaluation):   Initial Exercise Prescription:  Initial Exercise Prescription - 05/01/24 1500       Date of Initial Exercise RX and Referring Provider   Date 05/01/24    Referring Provider Wilburn Fillers, MD      Oxygen   Maintain Oxygen Saturation 88% or higher      Recumbant Bike   Level 1    RPM 50    Watts 15    Minutes 15    METs 1.63      NuStep   Level 1    SPM 80    Minutes 15    METs 1.63      Arm Ergometer   Level 1    RPM 25    Minutes 15    METs 1.63      Biostep-RELP   Level 1    SPM 50    Minutes 15    METs 1.63      Track   Laps 20    Minutes 15    METs 2.09      Prescription Details   Frequency (times per week) 3    Duration Progress to 30 minutes of continuous aerobic without signs/symptoms of physical distress      Intensity   THRR 40-80% of Max Heartrate 112-132    Ratings of Perceived Exertion 11-13    Perceived Dyspnea 0-4      Progression   Progression Continue to progress workloads to maintain intensity without signs/symptoms of physical distress.      Resistance Training   Training Prescription Yes    Weight 3 lb    Reps 10-15          Perform Capillary Blood Glucose checks as needed.  Exercise Prescription Changes:   Exercise Prescription Changes     Row Name 05/01/24 1500 05/29/24 1000 06/11/24 1400         Response to Exercise   Blood Pressure (Admit) 130/70 164/76 126/64     Blood Pressure (Exercise) 144/60 164/78 138/74     Blood Pressure (Exit) 126/56 134/72 132/72     Heart Rate (Admit) 92 bpm 83 bpm 90 bpm     Heart Rate (Exercise) 115 bpm 116 bpm 115 bpm     Heart Rate (Exit) 96 bpm 90 bpm 108 bpm     Oxygen Saturation (Admit) 96 % -- --     Oxygen Saturation (Exercise) 97 % -- --     Oxygen Saturation (Exit) 97 % -- --     Rating of Perceived Exertion (Exercise) 8 15 14      Perceived Dyspnea (Exercise) 0 0 0     Symptoms none none none      Comments results first 2 weeks of exercise --     Duration -- Continue with 30 min of aerobic exercise without signs/symptoms of physical distress. Continue with 30 min of aerobic exercise without signs/symptoms of physical distress.  Intensity -- THRR unchanged THRR unchanged       Progression   Progression -- Continue to progress workloads to maintain intensity without signs/symptoms of physical distress. Continue to progress workloads to maintain intensity without signs/symptoms of physical distress.     Average METs 1.63 2.03 2.35       Resistance Training   Training Prescription -- Yes Yes     Weight -- 3lb 3lb     Reps -- 10-15 10-15       Interval Training   Interval Training -- No No       NuStep   Level -- 5 5     Minutes -- 15 15     METs -- 3 3       Arm Ergometer   Level -- 1.5 1.5     Minutes -- 15 15     METs -- 1 1       Biostep-RELP   Level -- 1 4     Minutes -- 15 15     METs -- 2 3       Track   Laps -- 23 17     Minutes -- 15 15     METs -- 2.25 1.92       Oxygen   Maintain Oxygen Saturation -- 88% or higher 88% or higher        Exercise Comments:   Exercise Comments     Row Name 05/13/24 1128           Exercise Comments First full day of exercise!  Patient was oriented to gym and equipment including functions, settings, policies, and procedures.  Patient's individual exercise prescription and treatment plan were reviewed.  All starting workloads were established based on the results of the 6 minute walk test done at initial orientation visit.  The plan for exercise progression was also introduced and progression will be customized based on patient's performance and goals.          Exercise Goals and Review:   Exercise Goals     Row Name 05/01/24 1519             Exercise Goals   Increase Physical Activity Yes       Intervention Provide advice, education, support and counseling about physical activity/exercise needs.;Develop  an individualized exercise prescription for aerobic and resistive training based on initial evaluation findings, risk stratification, comorbidities and participant's personal goals.       Expected Outcomes Short Term: Attend rehab on a regular basis to increase amount of physical activity.;Long Term: Add in home exercise to make exercise part of routine and to increase amount of physical activity.;Long Term: Exercising regularly at least 3-5 days a week.       Increase Strength and Stamina Yes       Intervention Provide advice, education, support and counseling about physical activity/exercise needs.;Develop an individualized exercise prescription for aerobic and resistive training based on initial evaluation findings, risk stratification, comorbidities and participant's personal goals.       Expected Outcomes Short Term: Increase workloads from initial exercise prescription for resistance, speed, and METs.;Short Term: Perform resistance training exercises routinely during rehab and add in resistance training at home;Long Term: Improve cardiorespiratory fitness, muscular endurance and strength as measured by increased METs and functional capacity ( )       Able to understand and use rate of perceived exertion (RPE) scale Yes       Intervention Provide education and explanation on  how to use RPE scale       Expected Outcomes Short Term: Able to use RPE daily in rehab to express subjective intensity level;Long Term:  Able to use RPE to guide intensity level when exercising independently       Able to understand and use Dyspnea scale Yes       Intervention Provide education and explanation on how to use Dyspnea scale       Expected Outcomes Short Term: Able to use Dyspnea scale daily in rehab to express subjective sense of shortness of breath during exertion;Long Term: Able to use Dyspnea scale to guide intensity level when exercising independently       Knowledge and understanding of Target Heart Rate  Range (THRR) Yes       Intervention Provide education and explanation of THRR including how the numbers were predicted and where they are located for reference       Expected Outcomes Short Term: Able to state/look up THRR;Short Term: Able to use daily as guideline for intensity in rehab;Long Term: Able to use THRR to govern intensity when exercising independently       Able to check pulse independently Yes       Intervention Provide education and demonstration on how to check pulse in carotid and radial arteries.;Review the importance of being able to check your own pulse for safety during independent exercise       Expected Outcomes Short Term: Able to explain why pulse checking is important during independent exercise;Long Term: Able to check pulse independently and accurately       Understanding of Exercise Prescription Yes       Intervention Provide education, explanation, and written materials on patient's individual exercise prescription       Expected Outcomes Short Term: Able to explain program exercise prescription;Long Term: Able to explain home exercise prescription to exercise independently          Exercise Goals Re-Evaluation :  Exercise Goals Re-Evaluation     Row Name 05/13/24 1127 05/29/24 1017 06/11/24 1456 06/17/24 1114       Exercise Goal Re-Evaluation   Exercise Goals Review Increase Physical Activity;Able to understand and use rate of perceived exertion (RPE) scale;Understanding of Exercise Prescription;Knowledge and understanding of Target Heart Rate Range (THRR);Increase Strength and Stamina;Able to understand and use Dyspnea scale;Able to check pulse independently Increase Physical Activity;Increase Strength and Stamina;Understanding of Exercise Prescription Increase Physical Activity;Increase Strength and Stamina;Understanding of Exercise Prescription Increase Physical Activity;Increase Strength and Stamina;Understanding of Exercise Prescription    Comments Reviewed RPE  and dyspnea scale, THR and program prescription with pt today.  Pt voiced understanding and was given a copy of goals to take home. Chivas is off to a good start in rehab, and was able to attend his first few sessions during this review period. He has been able to increase from level 1 to 5 on the T4 nustep, and increase from 20 to 25 laps on the track. We will continue to monitor his progress in the program. Da is doing well in rehab. He increased to level 4 on the biostep. He maintained level 5 on the T4 nustep and level 1.5 on the arm ergometer. He decreased his laps slightly from 23 to 17 laps. We will continue to monitor his progress in the program. Maddock is doing well here at rehab. He is not doing any exercise at home, but 3 days here at rehab. ENcouraged him to make sure he can continue intentional  exercise post rehab.    Expected Outcomes Short: Use RPE daily to regulate intensity. Long: Follow program prescription in THR. Short: Continue to follow exercise prescription. Long: Continue exercise to improve strength and stamina. Short: Try and get back to 23 laps on the track. Long: Continue exercise to improve strength and stamina. STG: Attend rehab and look for post rehab exercise plan. LTG: Continue exercise to improve strength and stamina.       Discharge Exercise Prescription (Final Exercise Prescription Changes):  Exercise Prescription Changes - 06/11/24 1400       Response to Exercise   Blood Pressure (Admit) 126/64    Blood Pressure (Exercise) 138/74    Blood Pressure (Exit) 132/72    Heart Rate (Admit) 90 bpm    Heart Rate (Exercise) 115 bpm    Heart Rate (Exit) 108 bpm    Rating of Perceived Exertion (Exercise) 14    Perceived Dyspnea (Exercise) 0    Symptoms none    Duration Continue with 30 min of aerobic exercise without signs/symptoms of physical distress.    Intensity THRR unchanged      Progression   Progression Continue to progress workloads to maintain  intensity without signs/symptoms of physical distress.    Average METs 2.35      Resistance Training   Training Prescription Yes    Weight 3lb    Reps 10-15      Interval Training   Interval Training No      NuStep   Level 5    Minutes 15    METs 3      Arm Ergometer   Level 1.5    Minutes 15    METs 1      Biostep-RELP   Level 4    Minutes 15    METs 3      Track   Laps 17    Minutes 15    METs 1.92      Oxygen   Maintain Oxygen Saturation 88% or higher          Nutrition:  Target Goals: Understanding of nutrition guidelines, daily intake of sodium 1500mg , cholesterol 200mg , calories 30% from fat and 7% or less from saturated fats, daily to have 5 or more servings of fruits and vegetables.  Education: Nutrition 1 -Group instruction provided by verbal, written material, interactive activities, discussions, models, and posters to present general guidelines for heart healthy nutrition including macronutrients, label reading, and promoting whole foods over processed counterparts. Education serves as Pensions consultant of discussion of heart healthy eating for all. Written material provided at class time.    Education: Nutrition 2 -Group instruction provided by verbal, written material, interactive activities, discussions, models, and posters to present general guidelines for heart healthy nutrition including sodium, cholesterol, and saturated fat. Providing guidance of habit forming to improve blood pressure, cholesterol, and body weight. Written material provided at class time. Flowsheet Row Cardiac Rehab from 06/12/2024 in [facility] Cardiac and Pulmonary Rehab  Date 05/15/24  Educator jg  Instruction Review Code 1- Verbalizes Understanding      Biometrics:  Pre Biometrics - 05/01/24 1520       Pre Biometrics   Height 5' 9.72 (1.771 m)    Weight 236 lb 8 oz (107.3 kg)    Waist Circumference 48 inches    Hip Circumference 47.9 inches    Waist to Hip Ratio 1 %     BMI (Calculated) 34.2    Single Leg Stand 4 seconds  Nutrition Therapy Plan and Nutrition Goals:  Nutrition Therapy & Goals - 05/01/24 1524       Nutrition Therapy   Diet Cardiac, Low Na    Protein (specify units) 70-90    Fiber 30 grams    Whole Grain Foods 3 servings    Saturated Fats 15 max. grams    Fruits and Vegetables 5 servings/day    Sodium 2 grams      Personal Nutrition Goals   Nutrition Goal Eat 15-30gProtein and 30-60gCarbs at each meal.    Personal Goal #2 Read labels and reduce sodium intake to below 2300mg . Ideally 1500mg  per day.    Personal Goal #3 Reduce saturated fat, less than 12g per day. Replace bad fats for more heart healthy fats.    Comments Patient drinking mostly water, some cranberry juice in the mornings. He also likes whole milk. Spoke with him about saturated fat and sodium. He says he used to salt his food but knows its bad, so he stopped. Reviewed a facts labels and went over how to read labels with guideline limits of less than 12g saturated fat and less than 1500mg  sodium daily. Provided mediterranean diet handout. Educated on types of fats, sources, and how to read labels. Brainstormed some small meals and snack ideas focusing on prioritizing protein without excessive sodium and saturated fat intake.      Intervention Plan   Intervention Prescribe, educate and counsel regarding individualized specific dietary modifications aiming towards targeted core components such as weight, hypertension, lipid management, diabetes, heart failure and other comorbidities.;Nutrition handout(s) given to patient.    Expected Outcomes Long Term Goal: Adherence to prescribed nutrition plan.;Short Term Goal: A plan has been developed with personal nutrition goals set during dietitian appointment.;Short Term Goal: Understand basic principles of dietary content, such as calories, fat, sodium, cholesterol and nutrients.          Nutrition  Assessments:  MEDIFICTS Score Key: >=70 Need to make dietary changes  40-70 Heart Healthy Diet <= 40 Therapeutic Level Cholesterol Diet  Flowsheet Row Cardiac Rehab from 05/17/2024 in [facility] Cardiac and Pulmonary Rehab  Picture Your Plate Total Score on Admission 59   Picture Your Plate Scores: <59 Unhealthy dietary pattern with much room for improvement. 41-50 Dietary pattern unlikely to meet recommendations for good health and room for improvement. 51-60 More healthful dietary pattern, with some room for improvement.  >60 Healthy dietary pattern, although there may be some specific behaviors that could be improved.    Nutrition Goals Re-Evaluation:  Nutrition Goals Re-Evaluation     Row Name 06/17/24 1118             Goals   Comment Kolson reports he has been eating a lot of bread which has caused him to gain some weight lately. Spoke with him about carb portions and choosing good carb foods. Reviewed topics discussed in RD appointment around sodium and saturated fats.       Expected Outcome STG: Control carb portions and monitor sodium/saturated fat intake. LTG: Follow a heart healthy diet          Nutrition Goals Discharge (Final Nutrition Goals Re-Evaluation):  Nutrition Goals Re-Evaluation - 06/17/24 1118       Goals   Comment Joseluis reports he has been eating a lot of bread which has caused him to gain some weight lately. Spoke with him about carb portions and choosing good carb foods. Reviewed topics discussed in RD appointment around sodium and saturated fats.  Expected Outcome STG: Control carb portions and monitor sodium/saturated fat intake. LTG: Follow a heart healthy diet          Psychosocial: Target Goals: Acknowledge presence or absence of significant depression and/or stress, maximize coping skills, provide positive support system. Participant is able to verbalize types and ability to use techniques and skills needed for reducing stress and depression.    Education: Stress, Anxiety, and Depression - Group verbal and visual presentation to define topics covered.  Reviews how body is impacted by stress, anxiety, and depression.  Also discusses healthy ways to reduce stress and to treat/manage anxiety and depression. Written material provided at class time.   Education: Sleep Hygiene -Provides group verbal and written instruction about how sleep can affect your health.  Define sleep hygiene, discuss sleep cycles and impact of sleep habits. Review good sleep hygiene tips.   Initial Review & Psychosocial Screening:  Initial Psych Review & Screening - 04/29/24 1504       Initial Review   Current issues with None Identified      Family Dynamics   Good Support System? Yes    Comments He can look to his wife for support and states no mental instability.      Barriers   Psychosocial barriers to participate in program The patient should benefit from training in stress management and relaxation.;There are no identifiable barriers or psychosocial needs.      Screening Interventions   Interventions To provide support and resources with identified psychosocial needs;Provide feedback about the scores to participant;Encouraged to exercise    Expected Outcomes Short Term goal: Utilizing psychosocial counselor, staff and physician to assist with identification of specific Stressors or current issues interfering with healing process. Setting desired goal for each stressor or current issue identified.;Long Term Goal: Stressors or current issues are controlled or eliminated.;Short Term goal: Identification and review with participant of any Quality of Life or Depression concerns found by scoring the questionnaire.;Long Term goal: The participant improves quality of Life and PHQ9 Scores as seen by post scores and/or verbalization of changes          Quality of Life Scores:   Quality of Life - 05/17/24 1106       Quality of Life   Select Quality of  Life      Quality of Life Scores   Health/Function Pre 28.8 %    Socioeconomic Pre 27 %    Psych/Spiritual Pre 30 %    Family Pre 30 %    GLOBAL Pre 28.8 %         Scores of 19 and below usually indicate a poorer quality of life in these areas.  A difference of  2-3 points is a clinically meaningful difference.  A difference of 2-3 points in the total score of the Quality of Life Index has been associated with significant improvement in overall quality of life, self-image, physical symptoms, and general health in studies assessing change in quality of life.  PHQ-9: Review Flowsheet  More data exists      05/01/2024 06/09/2022 05/04/2022 10/18/2021 06/08/2021  Depression screen PHQ 2/9  Decreased Interest 0 0 0 0 0  Down, Depressed, Hopeless 0 0 0 0 0  PHQ - 2 Score 0 0 0 0 0  Altered sleeping 3 0 - - 0  Tired, decreased energy 0 0 - - 0  Change in appetite 0 0 - - 0  Feeling bad or failure about yourself  0 0 - -  0  Trouble concentrating 0 0 - - 0  Moving slowly or fidgety/restless 0 0 - - 0  Suicidal thoughts 0 0 - - 0  PHQ-9 Score 3 0 - - 0  Difficult doing work/chores Not difficult at all Not difficult at all - - Not difficult at all   Interpretation of Total Score  Total Score Depression Severity:  1-4 = Minimal depression, 5-9 = Mild depression, 10-14 = Moderate depression, 15-19 = Moderately severe depression, 20-27 = Severe depression   Psychosocial Evaluation and Intervention:  Psychosocial Evaluation - 04/29/24 1505       Psychosocial Evaluation & Interventions   Interventions Encouraged to exercise with the program and follow exercise prescription;Relaxation education;Stress management education    Comments He can look to his wife for support and states no mental instability.    Expected Outcomes Short: Start [facility] to help with mood. Long: Maintain a healthy mental state    Continue Psychosocial Services  Follow up required by staff          Psychosocial  Re-Evaluation:  Psychosocial Re-Evaluation     Row Name 06/17/24 1115             Psychosocial Re-Evaluation   Current issues with None Identified       Comments Jaecob denies any stress, anxiety, or depression at this time. He says he sleeps well, does wake up a few times at night. Says he gets 8-10hr of sleep total each night.       Expected Outcomes STG: Attend rehab regularly. LTG: Achieve and maintain positive outlook on health and daily life       Interventions Encouraged to attend Cardiac Rehabilitation for the exercise       Continue Psychosocial Services  Follow up required by staff          Psychosocial Discharge (Final Psychosocial Re-Evaluation):  Psychosocial Re-Evaluation - 06/17/24 1115       Psychosocial Re-Evaluation   Current issues with None Identified    Comments Alexys denies any stress, anxiety, or depression at this time. He says he sleeps well, does wake up a few times at night. Says he gets 8-10hr of sleep total each night.    Expected Outcomes STG: Attend rehab regularly. LTG: Achieve and maintain positive outlook on health and daily life    Interventions Encouraged to attend Cardiac Rehabilitation for the exercise    Continue Psychosocial Services  Follow up required by staff          Vocational Rehabilitation: Provide vocational rehab assistance to qualifying candidates.   Vocational Rehab Evaluation & Intervention:   Education: Education Goals: Education classes will be provided on a variety of topics geared toward better understanding of heart health and risk factor modification. Participant will state understanding/return demonstration of topics presented as noted by education test scores.  Learning Barriers/Preferences:  Learning Barriers/Preferences - 04/29/24 1504       Learning Barriers/Preferences   Learning Barriers --          General Cardiac Education Topics:  AED/CPR: - Group verbal and written instruction with the use  of models to demonstrate the basic use of the AED with the basic ABC's of resuscitation.   Test and Procedures: - Group verbal and visual presentation and models provide information about basic cardiac anatomy and function. Reviews the testing methods done to diagnose heart disease and the outcomes of the test results. Describes the treatment choices: Medical Management, Angioplasty, or Coronary Bypass Surgery for treating  various heart conditions including Myocardial Infarction, Angina, Valve Disease, and Cardiac Arrhythmias. Written material provided at class time. Flowsheet Row Cardiac Rehab from 06/12/2024 in [facility] Cardiac and Pulmonary Rehab  Date 05/22/24  Educator KB  Instruction Review Code 1- Verbalizes Understanding    Medication Safety: - Group verbal and visual instruction to review commonly prescribed medications for heart and lung disease. Reviews the medication, class of the drug, and side effects. Includes the steps to properly store meds and maintain the prescription regimen. Written material provided at class time. Flowsheet Row Cardiac Rehab from 06/12/2024 in [facility] Cardiac and Pulmonary Rehab  Date 05/29/24  Educator KB  Instruction Review Code 1- Verbalizes Understanding    Intimacy: - Group verbal instruction through game format to discuss how heart and lung disease can affect sexual intimacy. Written material provided at class time.   Know Your Numbers and Heart Failure: - Group verbal and visual instruction to discuss disease risk factors for cardiac and pulmonary disease and treatment options.  Reviews associated critical values for Overweight/Obesity, Hypertension, Cholesterol, and Diabetes.  Discusses basics of heart failure: signs/symptoms and treatments.  Introduces Heart Failure Zone chart for action plan for heart failure. Written material provided at class time. Flowsheet Row Cardiac Rehab from 06/12/2024 in [facility] Cardiac and Pulmonary Rehab  Date 06/05/24   Educator KB  Instruction Review Code 1- Verbalizes Understanding    Infection Prevention: - Provides verbal and written material to individual with discussion of infection control including proper hand washing and proper equipment cleaning during exercise session. Flowsheet Row Cardiac Rehab from 06/12/2024 in [facility] Cardiac and Pulmonary Rehab  Date 05/01/24  Educator MB  Instruction Review Code 1- Verbalizes Understanding    Falls Prevention: - Provides verbal and written material to individual with discussion of falls prevention and safety. Flowsheet Row Cardiac Rehab from 06/12/2024 in [facility] Cardiac and Pulmonary Rehab  Date 05/01/24  Educator MB  Instruction Review Code 1- Verbalizes Understanding    Other: -Provides group and verbal instruction on various topics (see comments)   Knowledge Questionnaire Score:  Knowledge Questionnaire Score - 05/17/24 1107       Knowledge Questionnaire Score   Pre Score 23/26          Core Components/Risk Factors/Patient Goals at Admission:  Personal Goals and Risk Factors at Admission - 05/01/24 1521       Core Components/Risk Factors/Patient Goals on Admission    Weight Management Yes;Weight Loss    Intervention Weight Management: Develop a combined nutrition and exercise program designed to reach desired caloric intake, while maintaining appropriate intake of nutrient and fiber, sodium and fats, and appropriate energy expenditure required for the weight goal.;Weight Management: Provide education and appropriate resources to help participant work on and attain dietary goals.;Weight Management/Obesity: Establish reasonable short term and long term weight goals.;Obesity: Provide education and appropriate resources to help participant work on and attain dietary goals.    Admit Weight 236 lb 8 oz (107.3 kg)    Goal Weight: Short Term 218 lb (98.9 kg)    Goal Weight: Long Term 200 lb (90.7 kg)    Expected Outcomes Short Term: Continue to  assess and modify interventions until short term weight is achieved;Long Term: Adherence to nutrition and physical activity/exercise program aimed toward attainment of established weight goal;Weight Loss: Understanding of general recommendations for a balanced deficit meal plan, which promotes 1-2 lb weight loss per week and includes a negative energy balance of [phone] kcal/d;Understanding recommendations for meals to include 15-35%  energy as protein, 25-35% energy from fat, 35-60% energy from carbohydrates, less than 200mg  of dietary cholesterol, 20-35 gm of total fiber daily;Understanding of distribution of calorie intake throughout the day with the consumption of 4-5 meals/snacks    Diabetes Yes    Intervention Provide education about signs/symptoms and action to take for hypo/hyperglycemia.;Provide education about proper nutrition, including hydration, and aerobic/resistive exercise prescription along with prescribed medications to achieve blood glucose in normal ranges: Fasting glucose 65-99 mg/dL    Expected Outcomes Short Term: Participant verbalizes understanding of the signs/symptoms and immediate care of hyper/hypoglycemia, proper foot care and importance of medication, aerobic/resistive exercise and nutrition plan for blood glucose control.;Long Term: Attainment of HbA1C < 7%.    Hypertension Yes    Intervention Provide education on lifestyle modifcations including regular physical activity/exercise, weight management, moderate sodium restriction and increased consumption of fresh fruit, vegetables, and low fat dairy, alcohol moderation, and smoking cessation.;Monitor prescription use compliance.    Expected Outcomes Short Term: Continued assessment and intervention until BP is < 140/50mm HG in hypertensive participants. < 130/59mm HG in hypertensive participants with diabetes, heart failure or chronic kidney disease.;Long Term: Maintenance of blood pressure at goal levels.    Lipids Yes     Intervention Provide education and support for participant on nutrition & aerobic/resistive exercise along with prescribed medications to achieve LDL 70mg , HDL >40mg .    Expected Outcomes Short Term: Participant states understanding of desired cholesterol values and is compliant with medications prescribed. Participant is following exercise prescription and nutrition guidelines.;Long Term: Cholesterol controlled with medications as prescribed, with individualized exercise RX and with personalized nutrition plan. Value goals: LDL < 70mg , HDL > 40 mg.          Education:Diabetes - Individual verbal and written instruction to review signs/symptoms of diabetes, desired ranges of glucose level fasting, after meals and with exercise. Acknowledge that pre and post exercise glucose checks will be done for 3 sessions at entry of program. Flowsheet Row Cardiac Rehab from 06/12/2024 in [facility] Cardiac and Pulmonary Rehab  Date 05/01/24  Educator MB  Instruction Review Code 1- Verbalizes Understanding    Core Components/Risk Factors/Patient Goals Review:   Goals and Risk Factor Review     Row Name 06/17/24 1121             Core Components/Risk Factors/Patient Goals Review   Personal Goals Review Hypertension;Weight Management/Obesity       Review Andres reports he has been trying to eat less junk food and brinf his weight down some. he says he take shis meds as prescribed and monitors his BP.       Expected Outcomes STG: Check BP at home and choose more whole foods with measured portions. LTG: manage risk factors independently          Core Components/Risk Factors/Patient Goals at Discharge (Final Review):   Goals and Risk Factor Review - 06/17/24 1121       Core Components/Risk Factors/Patient Goals Review   Personal Goals Review Hypertension;Weight Management/Obesity    Review Desten reports he has been trying to eat less junk food and brinf his weight down some. he says he take shis meds  as prescribed and monitors his BP.    Expected Outcomes STG: Check BP at home and choose more whole foods with measured portions. LTG: manage risk factors independently          ITP Comments:  ITP Comments     Row Name 04/29/24 1506 05/01/24 1515 05/13/24  1128 05/22/24 0753 06/19/24 0912   ITP Comments Virtual Visit completed. Patient informed on EP and RD appointment and 6 Minute walk test. Patient also informed of patient health questionnaires on My Chart. Patient Verbalizes understanding. Visit diagnosis can be found in [facility] 04/09/2024. Completed and gym orientation for cardiac rehab. Initial ITP created and sent for review to Dr. Oneil Pinal, Medical Director. First full day of exercise!  Patient was oriented to gym and equipment including functions, settings, policies, and procedures.  Patient's individual exercise prescription and treatment plan were reviewed.  All starting workloads were established based on the results of the 6 minute walk test done at initial orientation visit.  The plan for exercise progression was also introduced and progression will be customized based on patient's performance and goals. 30 Day review completed. Medical Director ITP review done, changes made as directed, and signed approval by Medical Director. New to Program. 30 Day review completed. Medical Director ITP review done, changes made as directed, and signed approval by Medical Director.      Comments: 30 day review

## 2024-06-21 ENCOUNTER — Encounter

## 2024-06-21 DIAGNOSIS — Z955 Presence of coronary angioplasty implant and graft: Secondary | ICD-10-CM

## 2024-06-21 NOTE — Progress Notes (Signed)
 Daily Session Note  Patient Details  Name: GRAYSIN LUCZYNSKI MRN: [id number] Date of Birth: 05/12/1946 Referring Provider:   Flowsheet Row Cardiac Rehab from 05/01/2024 in [facility] Cardiac and Pulmonary Rehab  Referring Provider Wilburn Fillers, MD    Encounter Date: 06/21/2024  Check In:  Session Check In - 06/21/24 1103       Check-In   Supervising physician immediately available to respond to emergencies See telemetry face sheet for immediately available ER MD    Location [facility]-Cardiac & Pulmonary Rehab    Staff Present Burnard Davenport RN,BSN,MPA;Joseph Hood RCP,RRT,BSRT;Maxon Conetta BS, Exercise Physiologist;Noah Tickle, BS, Exercise Physiologist    Virtual Visit No    Medication changes reported     No    Fall or balance concerns reported    No    Warm-up and Cool-down Performed on first and last piece of equipment    Resistance Training Performed Yes    VAD Patient? No    PAD/SET Patient? No      Pain Assessment   Currently in Pain? No/denies             Social History   Tobacco Use  Smoking Status Former   Current packs/day: 0.00   Types: Cigarettes   Quit date: 10/02/2001   Years since quitting: 22.7   Passive exposure: Past  Smokeless Tobacco Former  Tobacco Comments   Quit smoking in 2003; Started smoking at age 78, smoked about 40 years, smoked over 3 packs per day    Goals Met:  Proper associated with RPD/PD & O2 Sat Independence with exercise equipment Exercise tolerated well No report of concerns or symptoms today Strength training completed today  Goals Unmet:  Not Applicable  Comments: Pt able to follow exercise prescription today without complaint.  Will continue to monitor for progression.    Dr. Oneil Pinal is Medical Director for [facility] Cardiac Rehabilitation.  Dr. Fuad Aleskerov is Medical Director for [facility] Pulmonary Rehabilitation.

## 2024-06-24 ENCOUNTER — Encounter

## 2024-06-24 DIAGNOSIS — K136 Irritative hyperplasia of oral mucosa: Secondary | ICD-10-CM | POA: Diagnosis not present

## 2024-06-26 ENCOUNTER — Encounter: Attending: Cardiology

## 2024-06-26 DIAGNOSIS — Z955 Presence of coronary angioplasty implant and graft: Secondary | ICD-10-CM | POA: Insufficient documentation

## 2024-06-26 NOTE — Progress Notes (Signed)
 Daily Session Note  Patient Details  Name: Andrew Lyons MRN: [id number] Date of Birth: 1945-10-19 Referring Provider:   Flowsheet Row Cardiac Rehab from 05/01/2024 in [facility] Cardiac and Pulmonary Rehab  Referring Provider Wilburn Fillers, MD    Encounter Date: 06/26/2024  Check In:  Session Check In - 06/26/24 1129       Check-In   Supervising physician immediately available to respond to emergencies See telemetry face sheet for immediately available ER MD    Location [facility]-Cardiac & Pulmonary Rehab    Staff Present Burnard Davenport [facility] Peggi, RN, DNP, NE-BC;Joseph [facility] RN,BSN;Margaret Best, MS, Exercise Physiologist    Virtual Visit No    Medication changes reported     No    Fall or balance concerns reported    No    Warm-up and Cool-down Performed on first and last piece of equipment    Resistance Training Performed Yes    VAD Patient? No    PAD/SET Patient? No      Pain Assessment   Currently in Pain? No/denies             Social History   Tobacco Use  Smoking Status Former   Current packs/day: 0.00   Types: Cigarettes   Quit date: 10/02/2001   Years since quitting: 22.7   Passive exposure: Past  Smokeless Tobacco Former  Tobacco Comments   Quit smoking in 2003; Started smoking at age 66, smoked about 40 years, smoked over 3 packs per day    Goals Met:  Proper associated with RPD/PD & O2 Sat Independence with exercise equipment Exercise tolerated well No report of concerns or symptoms today Strength training completed today  Goals Unmet:  Not Applicable  Comments: Pt able to follow exercise prescription today without complaint.  Will continue to monitor for progression.    Dr. Oneil Pinal is Medical Director for [facility] Cardiac Rehabilitation.  Dr. Fuad Aleskerov is Medical Director for [facility] Pulmonary Rehabilitation.

## 2024-06-28 ENCOUNTER — Encounter

## 2024-06-28 DIAGNOSIS — Z955 Presence of coronary angioplasty implant and graft: Secondary | ICD-10-CM | POA: Diagnosis not present

## 2024-06-28 NOTE — Progress Notes (Signed)
 Daily Session Note  Patient Details  Name: AZEKIEL CREMER MRN: [id number] Date of Birth: 05-19-1946 Referring Provider:   Flowsheet Row Cardiac Rehab from 05/01/2024 in [facility] Cardiac and Pulmonary Rehab  Referring Provider Wilburn Fillers, MD    Encounter Date: 06/28/2024  Check In:  Session Check In - 06/28/24 1103       Check-In   Supervising physician immediately available to respond to emergencies See telemetry face sheet for immediately available ER MD    Location [facility]-Cardiac & Pulmonary Rehab    Staff Present Burnard Davenport RN,BSN,MPA;Laura Cates RN,BSN;Joseph [facility] BS, Exercise Physiologist    Virtual Visit No    Medication changes reported     No    Fall or balance concerns reported    No    Warm-up and Cool-down Performed on first and last piece of equipment    Resistance Training Performed Yes    VAD Patient? No    PAD/SET Patient? No      Pain Assessment   Currently in Pain? No/denies             Social History   Tobacco Use  Smoking Status Former   Current packs/day: 0.00   Types: Cigarettes   Quit date: 10/02/2001   Years since quitting: 22.7   Passive exposure: Past  Smokeless Tobacco Former  Tobacco Comments   Quit smoking in 2003; Started smoking at age 35, smoked about 40 years, smoked over 3 packs per day    Goals Met:  Proper associated with RPD/PD & O2 Sat Independence with exercise equipment Exercise tolerated well No report of concerns or symptoms today Strength training completed today  Goals Unmet:  Not Applicable  Comments: Pt able to follow exercise prescription today without complaint.  Will continue to monitor for progression.    Dr. Oneil Pinal is Medical Director for [facility] Cardiac Rehabilitation.  Dr. Fuad Aleskerov is Medical Director for [facility] Pulmonary Rehabilitation.

## 2024-07-01 ENCOUNTER — Encounter

## 2024-07-03 ENCOUNTER — Encounter

## 2024-07-03 DIAGNOSIS — Z955 Presence of coronary angioplasty implant and graft: Secondary | ICD-10-CM

## 2024-07-03 NOTE — Progress Notes (Signed)
 Daily Session Note  Patient Details  Name: GALO SAYED MRN: [id number] Date of Birth: 1946-07-27 Referring Provider:   Flowsheet Row Cardiac Rehab from 05/01/2024 in [facility] Cardiac and Pulmonary Rehab  Referring Provider Wilburn Fillers, MD    Encounter Date: 07/03/2024  Check In:  Session Check In - 07/03/24 1052       Check-In   Supervising physician immediately available to respond to emergencies See telemetry face sheet for immediately available ER MD    Location [facility]-Cardiac & Pulmonary Rehab    Staff Present Burnard Davenport [facility] Peggi, RN, DNP, NE-BC;Laura Cates RN,BSN;Joseph [facility] Best, MS, Exercise Physiologist;Tinya Cadogan Dyane HECKLE, ACSM CEP, Exercise Physiologist    Virtual Visit No    Medication changes reported     No    Fall or balance concerns reported    No    Warm-up and Cool-down Performed on first and last piece of equipment    Resistance Training Performed Yes    VAD Patient? No    PAD/SET Patient? No      Pain Assessment   Currently in Pain? No/denies             Social History   Tobacco Use  Smoking Status Former   Current packs/day: 0.00   Types: Cigarettes   Quit date: 10/02/2001   Years since quitting: 22.7   Passive exposure: Past  Smokeless Tobacco Former  Tobacco Comments   Quit smoking in 2003; Started smoking at age 46, smoked about 40 years, smoked over 3 packs per day    Goals Met:  Proper associated with RPD/PD & O2 Sat Independence with exercise equipment Exercise tolerated well No report of concerns or symptoms today Strength training completed today  Goals Unmet:  Not Applicable  Comments: Pt able to follow exercise prescription today without complaint.  Will continue to monitor for progression.    Dr. Oneil Pinal is Medical Director for [facility] Cardiac Rehabilitation.  Dr. Fuad Aleskerov is Medical Director for [facility] Pulmonary Rehabilitation.

## 2024-07-05 ENCOUNTER — Encounter: Admitting: *Deleted

## 2024-07-05 DIAGNOSIS — Z955 Presence of coronary angioplasty implant and graft: Secondary | ICD-10-CM | POA: Diagnosis not present

## 2024-07-05 NOTE — Progress Notes (Signed)
 Daily Session Note  Patient Details  Name: Andrew Lyons MRN: [id number] Date of Birth: 1946-02-24 Referring Provider:   Flowsheet Row Cardiac Rehab from 05/01/2024 in [facility] Cardiac and Pulmonary Rehab  Referring Provider Wilburn Fillers, MD    Encounter Date: 07/05/2024  Check In:  Session Check In - 07/05/24 1111       Check-In   Supervising physician immediately available to respond to emergencies See telemetry face sheet for immediately available ER MD    Location [facility]-Cardiac & Pulmonary Rehab    Staff Present Bruno Mirza RN,BSN;Maxon Burnell BS, Exercise Physiologist;Noah Tickle, BS, Exercise Physiologist;Mary Godley, RN, DNP, NE-BC    Virtual Visit No    Medication changes reported     No    Fall or balance concerns reported    No    Tobacco Cessation No Change    Warm-up and Cool-down Performed on first and last piece of equipment    Resistance Training Performed Yes    VAD Patient? No    PAD/SET Patient? No      Pain Assessment   Currently in Pain? No/denies             Social History   Tobacco Use  Smoking Status Former   Current packs/day: 0.00   Types: Cigarettes   Quit date: 10/02/2001   Years since quitting: 22.7   Passive exposure: Past  Smokeless Tobacco Former  Tobacco Comments   Quit smoking in 2003; Started smoking at age 68, smoked about 40 years, smoked over 3 packs per day    Goals Met:  Independence with exercise equipment Exercise tolerated well No report of concerns or symptoms today Strength training completed today  Goals Unmet:  Not Applicable  Comments: Pt able to follow exercise prescription today without complaint.  Will continue to monitor for progression.    Dr. Oneil Pinal is Medical Director for [facility] Cardiac Rehabilitation.  Dr. Fuad Aleskerov is Medical Director for [facility] Pulmonary Rehabilitation.

## 2024-07-08 ENCOUNTER — Encounter

## 2024-07-08 VITALS — Ht 69.72 in | Wt 240.4 lb

## 2024-07-08 DIAGNOSIS — Z955 Presence of coronary angioplasty implant and graft: Secondary | ICD-10-CM

## 2024-07-08 NOTE — Progress Notes (Signed)
 Daily Session Note  Patient Details  Name: Andrew Lyons MRN: [id number] Date of Birth: 08/09/1946 Referring Provider:   Flowsheet Row Cardiac Rehab from 05/01/2024 in [facility] Cardiac and Pulmonary Rehab  Referring Provider Wilburn Fillers, MD    Encounter Date: 07/08/2024  Check In:  Session Check In - 07/08/24 1057       Check-In   Supervising physician immediately available to respond to emergencies See telemetry face sheet for immediately available ER MD    Location [facility]-Cardiac & Pulmonary Rehab    Staff Present Burnard Davenport RN,BSN,MPA;Joseph Rolinda RCP,RRT,BSRT;Laura Cates RN,BSN;Cyndie Woodbeck Dyane BS, ACSM CEP, Exercise Physiologist    Virtual Visit No    Medication changes reported     No    Fall or balance concerns reported    No    Tobacco Cessation No Change    Warm-up and Cool-down Performed on first and last piece of equipment    Resistance Training Performed Yes    VAD Patient? No    PAD/SET Patient? No      Pain Assessment   Currently in Pain? No/denies             Social History   Tobacco Use  Smoking Status Former   Current packs/day: 0.00   Types: Cigarettes   Quit date: 10/02/2001   Years since quitting: 22.7   Passive exposure: Past  Smokeless Tobacco Former  Tobacco Comments   Quit smoking in 2003; Started smoking at age 49, smoked about 40 years, smoked over 3 packs per day    Goals Met:  Proper associated with RPD/PD & O2 Sat Independence with exercise equipment Exercise tolerated well No report of concerns or symptoms today Strength training completed today  Goals Unmet:  Not Applicable  Comments: Pt able to follow exercise prescription today without complaint.  Will continue to monitor for progression.   6 Minute Walk     Row Name 05/01/24 1515 07/08/24 1121       6 Minute Walk   Phase Initial Discharge    Distance 860 feet 990 feet    Distance % Change -- 15 %    Distance Feet Change -- 130 ft    Walk Time 6 minutes 6 minutes     # of Rest Breaks 0 0    MPH 1.63 1.88    METS 1.63 1.76    RPE 8 9    Perceived Dyspnea  0 0    VO2 Peak 5.7 6.15    Symptoms No No    Resting HR 92 bpm 71 bpm    Resting BP 130/70 102/60    Resting Oxygen Saturation  96 % 97 %    Exercise Oxygen Saturation  during 6 min walk 97 % 97 %    Max Ex. HR 115 bpm 97 bpm    Max Ex. BP 144/60 162/80    2 Minute Post BP 126/56 130/80        Dr. Oneil Pinal is Medical Director for [facility] Cardiac Rehabilitation.  Dr. Fuad Aleskerov is Medical Director for [facility] Pulmonary Rehabilitation.

## 2024-07-08 NOTE — Patient Instructions (Signed)
 Discharge Patient Instructions  Patient Details  Name: Andrew Lyons MRN: [id number] Date of Birth: 1945/10/18 Referring Provider:  Epifanio Alm SQUIBB, MD   Number of Visits: 36  Reason for Discharge:  Patient reached a stable level of exercise. Patient independent in their exercise. Patient has met program and personal goals.  Smoking History:  Social History   Tobacco Use  Smoking Status Former   Current packs/day: 0.00   Types: Cigarettes   Quit date: 10/02/2001   Years since quitting: 22.7   Passive exposure: Past  Smokeless Tobacco Former  Tobacco Comments   Quit smoking in 2003; Started smoking at age 37, smoked about 40 years, smoked over 3 packs per day    Diagnosis:  Status post coronary artery stent placement  Initial Exercise Prescription:  Initial Exercise Prescription - 05/01/24 1500       Date of Initial Exercise RX and Referring Provider   Date 05/01/24    Referring Provider Wilburn Fillers, MD      Oxygen   Maintain Oxygen Saturation 88% or higher      Recumbant Bike   Level 1    RPM 50    Watts 15    Minutes 15    METs 1.63      NuStep   Level 1    SPM 80    Minutes 15    METs 1.63      Arm Ergometer   Level 1    RPM 25    Minutes 15    METs 1.63      Biostep-RELP   Level 1    SPM 50    Minutes 15    METs 1.63      Track   Laps 20    Minutes 15    METs 2.09      Prescription Details   Frequency (times per week) 3    Duration Progress to 30 minutes of continuous aerobic without signs/symptoms of physical distress      Intensity   THRR 40-80% of Max Heartrate 112-132    Ratings of Perceived Exertion 11-13    Perceived Dyspnea 0-4      Progression   Progression Continue to progress workloads to maintain intensity without signs/symptoms of physical distress.      Resistance Training   Training Prescription Yes    Weight 3 lb    Reps 10-15          Discharge Exercise Prescription (Final Exercise Prescription  Changes):  Exercise Prescription Changes - 06/27/24 1700       Response to Exercise   Blood Pressure (Admit) 114/78    Blood Pressure (Exit) 134/78    Heart Rate (Admit) 75 bpm    Heart Rate (Exercise) 110 bpm    Heart Rate (Exit) 88 bpm    Oxygen Saturation (Admit) 97 %    Oxygen Saturation (Exercise) 91 %    Oxygen Saturation (Exit) 97 %    Rating of Perceived Exertion (Exercise) 13    Symptoms none    Duration Continue with 30 min of aerobic exercise without signs/symptoms of physical distress.    Intensity THRR unchanged      Progression   Progression Continue to progress workloads to maintain intensity without signs/symptoms of physical distress.    Average METs 2.54      Resistance Training   Training Prescription Yes    Weight 3lb    Reps 10-15      Interval Training  Interval Training No      NuStep   Level 5    Minutes 30    METs 3.6      Arm Ergometer   Level 2.8    Minutes 15    METs 2.4      T5 Nustep   Level 1    SPM 80    Minutes 30    METs 2      Home Exercise Plan   Plans to continue exercise at Home (comment)   walking   Frequency Add 1 additional day to program exercise sessions.    Initial Home Exercises Provided 06/26/24      Oxygen   Maintain Oxygen Saturation 88% or higher          Functional Capacity:  6 Minute Walk     Row Name 05/01/24 1515 07/08/24 1121       6 Minute Walk   Phase Initial Discharge    Distance 860 feet 990 feet    Distance % Change -- 15 %    Distance Feet Change -- 130 ft    Walk Time 6 minutes 6 minutes    # of Rest Breaks 0 0    MPH 1.63 1.88    METS 1.63 1.76    RPE 8 9    Perceived Dyspnea  0 0    VO2 Peak 5.7 6.15    Symptoms No No    Resting HR 92 bpm 71 bpm    Resting BP 130/70 102/60    Resting Oxygen Saturation  96 % 97 %    Exercise Oxygen Saturation  during 6 min walk 97 % 97 %    Max Ex. HR 115 bpm 97 bpm    Max Ex. BP 144/60 162/80    2 Minute Post BP 126/56 130/80        Nutrition & Weight - Outcomes:  Pre Biometrics - 05/01/24 1520       Pre Biometrics   Height 5' 9.72 (1.771 m)    Weight 236 lb 8 oz (107.3 kg)    Waist Circumference 48 inches    Hip Circumference 47.9 inches    Waist to Hip Ratio 1 %    BMI (Calculated) 34.2    Single Leg Stand 4 seconds          Post Biometrics - 07/08/24 1125        Post  Biometrics   Height 5' 9.72 (1.771 m)    Weight 240 lb 6.4 oz (109 kg)    Waist Circumference 47 inches    Hip Circumference 45 inches    Waist to Hip Ratio 1.04 %    BMI (Calculated) 34.77    Single Leg Stand 8.37 seconds          Nutrition:  Nutrition Therapy & Goals - 05/01/24 1524       Nutrition Therapy   Diet Cardiac, Low Na    Protein (specify units) 70-90    Fiber 30 grams    Whole Grain Foods 3 servings    Saturated Fats 15 max. grams    Fruits and Vegetables 5 servings/day    Sodium 2 grams      Personal Nutrition Goals   Nutrition Goal Eat 15-30gProtein and 30-60gCarbs at each meal.    Personal Goal #2 Read labels and reduce sodium intake to below 2300mg . Ideally 1500mg  per day.    Personal Goal #3 Reduce saturated fat, less than 12g per day. Replace  bad fats for more heart healthy fats.    Comments Patient drinking mostly water, some cranberry juice in the mornings. He also likes whole milk. Spoke with him about saturated fat and sodium. He says he used to salt his food but knows its bad, so he stopped. Reviewed a facts labels and went over how to read labels with guideline limits of less than 12g saturated fat and less than 1500mg  sodium daily. Provided mediterranean diet handout. Educated on types of fats, sources, and how to read labels. Brainstormed some small meals and snack ideas focusing on prioritizing protein without excessive sodium and saturated fat intake.      Intervention Plan   Intervention Prescribe, educate and counsel regarding individualized specific dietary modifications aiming towards  targeted core components such as weight, hypertension, lipid management, diabetes, heart failure and other comorbidities.;Nutrition handout(s) given to patient.    Expected Outcomes Long Term Goal: Adherence to prescribed nutrition plan.;Short Term Goal: A plan has been developed with personal nutrition goals set during dietitian appointment.;Short Term Goal: Understand basic principles of dietary content, such as calories, fat, sodium, cholesterol and nutrients.          Nutrition Discharge:   Education Questionnaire Score:  Knowledge Questionnaire Score - 05/17/24 1107       Knowledge Questionnaire Score   Pre Score 23/26          Goals reviewed with patient; copy given to patient.

## 2024-07-10 ENCOUNTER — Encounter

## 2024-07-10 DIAGNOSIS — Z955 Presence of coronary angioplasty implant and graft: Secondary | ICD-10-CM

## 2024-07-10 NOTE — Progress Notes (Signed)
 Daily Session Note  Patient Details  Name: XZANDER GILHAM MRN: [id number] Date of Birth: [birth year] Referring Provider:   Flowsheet Row Cardiac Rehab from 05/01/2024 in [facility] Cardiac and Pulmonary Rehab  Referring Provider Wilburn Fillers, MD    Encounter Date: 07/10/2024  Check In:  Session Check In - 07/10/24 1034       Check-In   Supervising physician immediately available to respond to emergencies See telemetry face sheet for immediately available ER MD    Location [facility]-Cardiac & Pulmonary Rehab    Staff Present Burnard Davenport RN,BSN,MPA;Meredith Tressa RN,BSN;Joseph [facility] BS, ACSM CEP, Exercise Physiologist    Virtual Visit No    Medication changes reported     No    Fall or balance concerns reported    No    Tobacco Cessation No Change    Warm-up and Cool-down Performed on first and last piece of equipment    Resistance Training Performed Yes    VAD Patient? No    PAD/SET Patient? No      Pain Assessment   Currently in Pain? No/denies             Social History   Tobacco Use  Smoking Status Former   Current packs/day: 0.00   Types: Cigarettes   Quit date: 10/02/2001   Years since quitting: 22.7   Passive exposure: Past  Smokeless Tobacco Former  Tobacco Comments   Quit smoking in 2003; Started smoking at age 22, smoked about 40 years, smoked over 3 packs per day    Goals Met:  Proper associated with RPD/PD & O2 Sat Independence with exercise equipment Exercise tolerated well No report of concerns or symptoms today Strength training completed today  Goals Unmet:  Not Applicable  Comments: Pt able to follow exercise prescription today without complaint.  Will continue to monitor for progression.    Dr. Oneil Pinal is Medical Director for [facility] Cardiac Rehabilitation.  Dr. Fuad Aleskerov is Medical Director for [facility] Pulmonary Rehabilitation.

## 2024-07-12 ENCOUNTER — Encounter

## 2024-07-12 DIAGNOSIS — Z955 Presence of coronary angioplasty implant and graft: Secondary | ICD-10-CM | POA: Diagnosis not present

## 2024-07-12 NOTE — Progress Notes (Signed)
 Daily Session Note  Patient Details  Name: VYRON FRONCZAK MRN: [id number] Date of Birth: [birth year] Referring Provider:   Flowsheet Row Cardiac Rehab from 05/01/2024 in [facility] Cardiac and Pulmonary Rehab  Referring Provider Wilburn Fillers, MD    Encounter Date: 07/12/2024  Check In:  Session Check In - 07/12/24 1048       Check-In   Supervising physician immediately available to respond to emergencies See telemetry face sheet for immediately available ER MD    Location [facility]-Cardiac & Pulmonary Rehab    Staff Present Burnard Davenport RN,BSN,MPA;Maxon Conetta BS, Exercise Physiologist;Noah Tickle, BS, Exercise Physiologist;Laureen Delores, BS, RRT, CPFT    Virtual Visit No    Medication changes reported     No    Fall or balance concerns reported    No    Tobacco Cessation No Change    Warm-up and Cool-down Performed on first and last piece of equipment    Resistance Training Performed Yes    VAD Patient? No    PAD/SET Patient? No      Pain Assessment   Currently in Pain? No/denies             Social History   Tobacco Use  Smoking Status Former   Current packs/day: 0.00   Types: Cigarettes   Quit date: 10/02/2001   Years since quitting: 22.7   Passive exposure: Past  Smokeless Tobacco Former  Tobacco Comments   Quit smoking in 2003; Started smoking at age 4, smoked about 40 years, smoked over 3 packs per day    Goals Met:  Independence with exercise equipment Exercise tolerated well No report of concerns or symptoms today Strength training completed today  Goals Unmet:  Not Applicable  Comments: Pt able to follow exercise prescription today without complaint.  Will continue to monitor for progression.    Dr. Oneil Pinal is Medical Director for [facility] Cardiac Rehabilitation.  Dr. Fuad Aleskerov is Medical Director for [facility] Pulmonary Rehabilitation.

## 2024-07-12 NOTE — Progress Notes (Addendum)
 Andrew Lyons                                          MRN: [id number]   07/12/2024   The [facility] Quality Team Specialist reviewed this patient medical record for the purposes of chart review for care gap closure. The following were reviewed: chart review for care gap closure-kidney health evaluation for diabetes:eGFR  and uACR.  08/30/2024- chart reviewed for cbp and ked  09/23/2024- Abstracted 04/26/2024 d/c TRC documentation.   [facility]I Quality Team

## 2024-07-15 ENCOUNTER — Encounter

## 2024-07-15 DIAGNOSIS — Z955 Presence of coronary angioplasty implant and graft: Secondary | ICD-10-CM

## 2024-07-15 NOTE — Progress Notes (Signed)
 Daily Session Note  Patient Details  Name: KEYAN FOLSON MRN: [id number] Date of Birth: 01-07-1946 Referring Provider:   Flowsheet Row Cardiac Rehab from 05/01/2024 in [facility] Cardiac and Pulmonary Rehab  Referring Provider Wilburn Fillers, MD    Encounter Date: 07/15/2024  Check In:  Session Check In - 07/15/24 1049       Check-In   Supervising physician immediately available to respond to emergencies See telemetry face sheet for immediately available ER MD    Location [facility]-Cardiac & Pulmonary Rehab    Staff Present Burnard Davenport RN,BSN,MPA;Joseph [facility] RCP,RRT,BSRT;Maxon Burnell BS, Exercise Physiologist;Laura Cates RN,BSN;Tanaya Dunigan Dyane BS, ACSM CEP, Exercise Physiologist    Virtual Visit No    Medication changes reported     No    Fall or balance concerns reported    No    Tobacco Cessation No Change    Warm-up and Cool-down Performed on first and last piece of equipment    Resistance Training Performed Yes    VAD Patient? No    PAD/SET Patient? No      Pain Assessment   Currently in Pain? No/denies             Social History   Tobacco Use  Smoking Status Former   Current packs/day: 0.00   Types: Cigarettes   Quit date: 10/02/2001   Years since quitting: 22.8   Passive exposure: Past  Smokeless Tobacco Former  Tobacco Comments   Quit smoking in 2003; Started smoking at age 73, smoked about 40 years, smoked over 3 packs per day    Goals Met:  Proper associated with RPD/PD & O2 Sat Independence with exercise equipment Exercise tolerated well No report of concerns or symptoms today Strength training completed today  Goals Unmet:  Not Applicable  Comments: Pt able to follow exercise prescription today without complaint.  Will continue to monitor for progression.    Dr. Oneil Pinal is Medical Director for [facility] Cardiac Rehabilitation.  Dr. Fuad Aleskerov is Medical Director for [facility] Pulmonary Rehabilitation.

## 2024-07-17 ENCOUNTER — Encounter: Admitting: Emergency Medicine

## 2024-07-17 DIAGNOSIS — Z955 Presence of coronary angioplasty implant and graft: Secondary | ICD-10-CM

## 2024-07-17 NOTE — Progress Notes (Signed)
 Cardiac Individual Treatment Plan  Patient Details  Name: DAYSHON ROBACK MRN: [id number] Date of Birth: [birth year] Referring Provider:   Flowsheet Row Cardiac Rehab from 05/01/2024 in [facility] Cardiac and Pulmonary Rehab  Referring Provider Wilburn Fillers, MD    Initial Encounter Date:  Flowsheet Row Cardiac Rehab from 05/01/2024 in [facility] Cardiac and Pulmonary Rehab  Date 05/01/24    Visit Diagnosis: Status post coronary artery stent placement  Patient's Home Medications on Admission:  Current Outpatient Medications:    amLODipine  (NORVASC ) 5 MG tablet, Take 5 mg by mouth., Disp: , Rfl:    amLODipine -benazepril  (LOTREL) 5-40 MG capsule, Take 1 capsule by mouth daily. (Patient not taking: Reported on 04/29/2024), Disp: 90 capsule, Rfl: 1   aspirin  EC 81 MG tablet, Take 81 mg by mouth., Disp: , Rfl:    cholecalciferol  (VITAMIN D ) 25 MCG (1000 UT) tablet, Take 1,000 Units by mouth daily. , Disp: , Rfl:    clopidogrel  (PLAVIX ) 75 MG tablet, TAKE 1 TABLET BY MOUTH ONCE A DAY, Disp: 90 tablet, Rfl: 0   Coenzyme Q10 100 MG TABS, Take 100 mg by mouth daily. , Disp: , Rfl:    fluticasone  (FLONASE ) 50 MCG/ACT nasal spray, USE 2 SPRAYS INTO EACH NOSTRIL EVERY DAY, Disp: 48 g, Rfl: 5   JARDIANCE  25 MG TABS tablet, TAKE 1 TABLET BY MOUTH ONCE DAILY, Disp: 90 tablet, Rfl: 3   Lancets MISC, 1 each., Disp: , Rfl:    metFORMIN  (GLUCOPHAGE ) 500 MG tablet, TAKE 1 TABLET BY MOUTH TWICE A DAY WITH A MEAL, Disp: 180 tablet, Rfl: 1   metoprolol  succinate (TOPROL -XL) 100 MG 24 hr tablet, TAKE 1 TABLET BY MOUTH ONCE A DAY WITH OR IMMEDIATELY FOLLOWING A MEAL, Disp: 90 tablet, Rfl: 3   montelukast  (SINGULAIR ) 10 MG tablet, TAKE ONE TABLET BY MOUTH DAILY, Disp: 90 tablet, Rfl: 3   nitroGLYCERIN  (NITROSTAT ) 0.4 MG SL tablet, Place under the tongue., Disp: , Rfl:    omeprazole  (PRILOSEC) 40 MG capsule, TAKE ONE CAPSULE BY MOUTH DAILY, Disp: 90 capsule, Rfl: 3   polyethylene glycol (MIRALAX / GLYCOLAX) 17 g packet, Take  17 g by mouth., Disp: , Rfl:    predniSONE  (DELTASONE ) 10 MG tablet, Take by mouth., Disp: , Rfl:    predniSONE  (DELTASONE ) 50 MG tablet, SMARTSIG:1 Tablet(s) By Mouth Every Evening, Disp: , Rfl:    rosuvastatin  (CRESTOR ) 20 MG tablet, TAKE 1 TABLET BY MOUTH ONCE A DAY (Patient not taking: Reported on 04/29/2024), Disp: 90 tablet, Rfl: 3   rosuvastatin  (CRESTOR ) 20 MG tablet, Take 20 mg by mouth., Disp: , Rfl:    senna-docusate (SENOKOT-S) 8.6-50 MG tablet, Take 1 tablet by mouth., Disp: , Rfl:    sildenafil (VIAGRA) 100 MG tablet, Take 100 mg by mouth as needed for erectile dysfunction., Disp: , Rfl:    tirzepatide  (MOUNJARO ) 7.5 MG/0.5ML Pen, Inject 7.5 mg into the skin once a week., Disp: 6 mL, Rfl: 1   vitamin B-12 (CYANOCOBALAMIN ) 1000 MCG tablet, Take 1,000 mcg by mouth daily., Disp: , Rfl:   Past Medical History: Past Medical History:  Diagnosis Date   Adenomatous polyps    Aortic atherosclerosis    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; b.) comp PCI 02/13/02: 2.5 x 13mm DES (unk type) to p-mLAD, 2.25 x 7mm BiodivYsio DES to RPDA, 2.25 x 23mm + 3.0 x 18mm + 3.0 x 13mm (unk type); c.) LHC 02/14/11: 25 pLAD-1, 10 ISR pLAD-2, 30 mLAD, 30 mLCx, 30 pRCA, 40 mRCA, 30 dRCA - med mgmt; c.) MV 10/29/19: no isch; d.) MV 05/03/21: no isch   Diastolic dysfunction    a.) TTE 04/05/2017: EF >55%, mild LVH, G1DD, midl LAE, mild MR/TR/PR, biopros AoV (MPG 10); TTE 12/04/2019: EF >55%, mild LVH, G1DD, mild BAE, mild RVE, triv TR/PR, mild MR, biopros AoV (MPG 12); c.) TTE 04/30/2021: EF >55%, mild LVH, G1DD, mod LAE, triv MR/TR,  biopros AoV;  d.) TTE 07/24/2023: EF >55%, G1DD, triv panval regurgitation, mild AS (AVA = 0.8 cm2)   ED (erectile dysfunction)    a.) on PDE5i (tadalafil )   GERD (gastroesophageal reflux disease)    History of 2019 novel coronavirus disease (COVID-19)    Hx of BKA, right (HCC)    Hx of dysplastic nevus 10/19/2006   L upper back, medial to mid scapula, slight to moderate atypia   Hx of dysplastic nevus 03/18/2008   R upper back 4.0cm lat to spine, moderate to marked atypia, excised 04/15/2008   Hx of dysplastic nevus 09/22/2008   R mid back 5.0cm lat to spine, moderate atypia   Hx of dysplastic nevus 04/06/2009   L mid to low back 11.0cm lat to spine, mild atypia   Hyperlipidemia    Hypertension    Internal hemorrhoids    Long term current use of clopidogrel     NSTEMI (non-ST elevated myocardial infarction) (HCC) 02/12/2002   a.) LHC 02/12/2002: complex multi-vessel CAD--> transferred to [facility]; b.) complex PCI 02/13/2002: 2.5 x 13 mm DES (unknown type) to p-mLAD, 2.25 x 7 mm BiodivYsio DES to RPDA, serial 2.25 x 23 mm + 3.0 x 18 mm + 3.0 x 13 mm to mRCA (unknown type)   OAB (overactive bladder)    OSA on CPAP    Peripheral vascular disease    a.) s/p RIGHT BKA   Presence of stent in coronary artery    a.) TOTAL of 5 as of 07/27/2023 (p-mLAD x1, RPDA x 1, mRCA x 3)   S/P AVR (aortic valve replacement) 03/29/2011   a.) 25 mm Magna pericardial tissue bioprosthetic valve   Seizures (HCC)    Statin induced/mediated hepatitis    T2DM (type 2 diabetes mellitus) (HCC)     Tobacco Use: Social History   Tobacco Use  Smoking Status Former   Current packs/day: 0.00   Types: Cigarettes   Quit date: 10/02/2001   Years since quitting: 22.8   Passive exposure: Past  Smokeless Tobacco Former  Tobacco Comments   Quit smoking in 2003; Started smoking at age 78, smoked about 40 years, smoked over 3 packs per day    Labs: Review Flowsheet  More data exists      Latest Ref Rng & Units  06/09/2021 11/29/2021 04/13/2022 08/10/2022 03/11/2024  Labs for ITP Cardiac and Pulmonary Rehab  Cholestrol 100 - 199 mg/dL 841  - 862  - -  LDL (calc) 0 - 99 mg/dL 74  - 69  - -  HDL-C >60 mg/dL 33  - 32  - -  Trlycerides 0 - 149 mg/dL 683  -  219  - -  Hemoglobin A1c 4.0 - 5.6 % 7.2  7.2  7.7  8.0  -  PH, Arterial 7.35 - 7.45 - - - - 7.358   PCO2 arterial 32 - 48 mmHg - - - - 37.2   Bicarbonate 20.0 - 28.0 mmol/L - - - - 20.9  23.8   TCO2 22 - 32 mmol/L - - - - 22  25   Acid-base deficit 0.0 - 2.0 mmol/L - - - - 4.0  2.0   O2 Saturation % - - - - 91  60     Details       Multiple values from one day are sorted in reverse-chronological order          Exercise Target Goals: Exercise Program Goal: Individual exercise prescription set using results from initial 6 min walk test and THRR while considering  patient's activity barriers and safety.   Exercise Prescription Goal: Initial exercise prescription builds to 30-45 minutes a day of aerobic activity, 2-3 days per week.  Home exercise guidelines will be given to patient during program as part of exercise prescription that the participant will acknowledge.   Education: Aerobic Exercise: - Group verbal and visual presentation on the components of exercise prescription. Introduces F.I.T.T principle from ACSM for exercise prescriptions.  Reviews F.I.T.T. principles of aerobic exercise including progression. Written material provided at class time. Flowsheet Row Cardiac Rehab from 07/17/2024 in [facility] Cardiac and Pulmonary Rehab  Date 07/10/24  Educator nt  Instruction Review Code 1- TEFL teacher Understanding    Education: Resistance Exercise: - Group verbal and visual presentation on the components of exercise prescription. Introduces F.I.T.T principle from ACSM for exercise prescriptions  Reviews F.I.T.T. principles of resistance exercise including progression. Written material provided at class time. Flowsheet Row Cardiac Rehab from  07/17/2024 in [facility] Cardiac and Pulmonary Rehab  Date 07/03/24  Educator nt  Instruction Review Code 1- TEFL teacher Understanding     Education: Exercise & Equipment Safety: - Individual verbal instruction and demonstration of equipment use and safety with use of the equipment. Flowsheet Row Cardiac Rehab from 07/17/2024 in [facility] Cardiac and Pulmonary Rehab  Date 05/01/24  Educator MB  Instruction Review Code 1- Verbalizes Understanding    Education: Exercise Physiology & General Exercise Guidelines: - Group verbal and written instruction with models to review the exercise physiology of the cardiovascular system and associated critical values. Provides general exercise guidelines with specific guidelines to those with heart or lung disease. Written material provided at class time. Flowsheet Row Cardiac Rehab from 07/17/2024 in [facility] Cardiac and Pulmonary Rehab  Date 06/26/24  Educator nt  Instruction Review Code 1- TEFL teacher Understanding    Education: Flexibility, Balance, Mind/Body Relaxation: - Group verbal and visual presentation with interactive activity on the components of exercise prescription. Introduces F.I.T.T principle from ACSM for exercise prescriptions. Reviews F.I.T.T. principles of flexibility and balance exercise training including progression. Also discusses the mind body connection.  Reviews various relaxation techniques to help reduce and manage stress (i.e. Deep breathing, progressive muscle relaxation, and visualization). Balance handout provided to take home. Written material provided at class time. Flowsheet Row Cardiac Rehab from 07/17/2024 in [facility] Cardiac and Pulmonary Rehab  Date 07/03/24  Educator nt  Instruction Review Code 1- Verbalizes Understanding    Activity Barriers & Risk Stratification:  Activity Barriers & Cardiac Risk Stratification - 05/01/24 1516       Activity Barriers & Cardiac Risk Stratification   Activity Barriers Other (comment)  Comments Right leg amputee, 5lb lifting restriction    Cardiac Risk Stratification Moderate          6 Minute Walk:  6 Minute Walk     Row Name 05/01/24 1515 07/08/24 1121       6 Minute Walk   Phase Initial Discharge    Distance 860 feet 990 feet    Distance % Change -- 15 %    Distance Feet Change -- 130 ft    Walk Time 6 minutes 6 minutes    # of Rest Breaks 0 0    MPH 1.63 1.88    METS 1.63 1.76    RPE 8 9    Perceived Dyspnea  0 0    VO2 Peak 5.7 6.15    Symptoms No No    Resting HR 92 bpm 71 bpm    Resting BP 130/70 102/60    Resting Oxygen Saturation  96 % 97 %    Exercise Oxygen Saturation  during 6 min walk 97 % 97 %    Max Ex. HR 115 bpm 97 bpm    Max Ex. BP 144/60 162/80    2 Minute Post BP 126/56 130/80       Oxygen Initial Assessment:   Oxygen Re-Evaluation:   Oxygen Discharge (Final Oxygen Re-Evaluation):   Initial Exercise Prescription:  Initial Exercise Prescription - 05/01/24 1500       Date of Initial Exercise RX and Referring Provider   Date 05/01/24    Referring Provider Wilburn Fillers, MD      Oxygen   Maintain Oxygen Saturation 88% or higher      Recumbant Bike   Level 1    RPM 50    Watts 15    Minutes 15    METs 1.63      NuStep   Level 1    SPM 80    Minutes 15    METs 1.63      Arm Ergometer   Level 1    RPM 25    Minutes 15    METs 1.63      Biostep-RELP   Level 1    SPM 50    Minutes 15    METs 1.63      Track   Laps 20    Minutes 15    METs 2.09      Prescription Details   Frequency (times per week) 3    Duration Progress to 30 minutes of continuous aerobic without signs/symptoms of physical distress      Intensity   THRR 40-80% of Max Heartrate 112-132    Ratings of Perceived Exertion 11-13    Perceived Dyspnea 0-4      Progression   Progression Continue to progress workloads to maintain intensity without signs/symptoms of physical distress.      Resistance Training   Training  Prescription Yes    Weight 3 lb    Reps 10-15          Perform Capillary Blood Glucose checks as needed.  Exercise Prescription Changes:   Exercise Prescription Changes     Row Name 05/01/24 1500 05/29/24 1000 06/11/24 1400 06/26/24 1100 06/27/24 1700     Response to Exercise   Blood Pressure (Admit) 130/70 164/76 126/64 -- 114/78   Blood Pressure (Exercise) 144/60 164/78 138/74 -- --   Blood Pressure (Exit) 126/56 134/72 132/72 -- 134/78   Heart Rate (Admit) 92 bpm 83 bpm 90 bpm -- 75 bpm  Heart Rate (Exercise) 115 bpm 116 bpm 115 bpm -- 110 bpm   Heart Rate (Exit) 96 bpm 90 bpm 108 bpm -- 88 bpm   Oxygen Saturation (Admit) 96 % -- -- -- 97 %   Oxygen Saturation (Exercise) 97 % -- -- -- 91 %   Oxygen Saturation (Exit) 97 % -- -- -- 97 %   Rating of Perceived Exertion (Exercise) 8 15 14  -- 13   Perceived Dyspnea (Exercise) 0 0 0 -- --   Symptoms none none none -- none   Comments results first 2 weeks of exercise -- -- --   Duration -- Continue with 30 min of aerobic exercise without signs/symptoms of physical distress. Continue with 30 min of aerobic exercise without signs/symptoms of physical distress. -- Continue with 30 min of aerobic exercise without signs/symptoms of physical distress.   Intensity -- THRR unchanged THRR unchanged -- THRR unchanged     Progression   Progression -- Continue to progress workloads to maintain intensity without signs/symptoms of physical distress. Continue to progress workloads to maintain intensity without signs/symptoms of physical distress. -- Continue to progress workloads to maintain intensity without signs/symptoms of physical distress.   Average METs 1.63 2.03 2.35 -- 2.54     Resistance Training   Training Prescription -- Yes Yes -- Yes   Weight -- 3lb 3lb -- 3lb   Reps -- 10-15 10-15 -- 10-15     Interval Training   Interval Training -- No No -- No     NuStep   Level -- 5 5 -- 5   Minutes -- 15 15 -- 30   METs -- 3 3 --  3.6     Arm Ergometer   Level -- 1.5 1.5 -- 2.8   Minutes -- 15 15 -- 15   METs -- 1 1 -- 2.4     T5 Nustep   Level -- -- -- -- 1   SPM -- -- -- -- 80   Minutes -- -- -- -- 30   METs -- -- -- -- 2     Biostep-RELP   Level -- 1 4 -- --   Minutes -- 15 15 -- --   METs -- 2 3 -- --     Track   Laps -- 23 17 -- --   Minutes -- 15 15 -- --   METs -- 2.25 1.92 -- --     Home Exercise Plan   Plans to continue exercise at -- -- -- Home (comment)  walking Home (comment)  walking   Frequency -- -- -- Add 1 additional day to program exercise sessions. Add 1 additional day to program exercise sessions.   Initial Home Exercises Provided -- -- -- 06/26/24 06/26/24     Oxygen   Maintain Oxygen Saturation -- 88% or higher 88% or higher -- 88% or higher    Row Name 07/10/24 1100             Response to Exercise   Blood Pressure (Admit) 142/84       Blood Pressure (Exit) 124/62       Heart Rate (Admit) 65 bpm       Heart Rate (Exercise) 91 bpm       Heart Rate (Exit) 84 bpm       Oxygen Saturation (Admit) 98 %       Oxygen Saturation (Exercise) 94 %       Oxygen Saturation (Exit) 96 %  Rating of Perceived Exertion (Exercise) 14       Symptoms none       Duration Continue with 30 min of aerobic exercise without signs/symptoms of physical distress.       Intensity THRR unchanged         Progression   Progression Continue to progress workloads to maintain intensity without signs/symptoms of physical distress.       Average METs 2.1         Resistance Training   Training Prescription Yes       Weight 3lb       Reps 10-15         Interval Training   Interval Training No         NuStep   Level 5       Minutes 15       METs 3.5         Arm Ergometer   Level 3.5       Minutes 15       METs 1         Home Exercise Plan   Plans to continue exercise at Home (comment)  walking       Frequency Add 1 additional day to program exercise sessions.       Initial Home  Exercises Provided 06/26/24         Oxygen   Maintain Oxygen Saturation 88% or higher          Exercise Comments:   Exercise Comments     Row Name 05/13/24 1128           Exercise Comments First full day of exercise!  Patient was oriented to gym and equipment including functions, settings, policies, and procedures.  Patient's individual exercise prescription and treatment plan were reviewed.  All starting workloads were established based on the results of the 6 minute walk test done at initial orientation visit.  The plan for exercise progression was also introduced and progression will be customized based on patient's performance and goals.          Exercise Goals and Review:   Exercise Goals     Row Name 05/01/24 1519             Exercise Goals   Increase Physical Activity Yes       Intervention Provide advice, education, support and counseling about physical activity/exercise needs.;Develop an individualized exercise prescription for aerobic and resistive training based on initial evaluation findings, risk stratification, comorbidities and participant's personal goals.       Expected Outcomes Short Term: Attend rehab on a regular basis to increase amount of physical activity.;Long Term: Add in home exercise to make exercise part of routine and to increase amount of physical activity.;Long Term: Exercising regularly at least 3-5 days a week.       Increase Strength and Stamina Yes       Intervention Provide advice, education, support and counseling about physical activity/exercise needs.;Develop an individualized exercise prescription for aerobic and resistive training based on initial evaluation findings, risk stratification, comorbidities and participant's personal goals.       Expected Outcomes Short Term: Increase workloads from initial exercise prescription for resistance, speed, and METs.;Short Term: Perform resistance training exercises routinely during rehab and add in  resistance training at home;Long Term: Improve cardiorespiratory fitness, muscular endurance and strength as measured by increased METs and functional capacity ( )       Able to understand and use rate of perceived  exertion (RPE) scale Yes       Intervention Provide education and explanation on how to use RPE scale       Expected Outcomes Short Term: Able to use RPE daily in rehab to express subjective intensity level;Long Term:  Able to use RPE to guide intensity level when exercising independently       Able to understand and use Dyspnea scale Yes       Intervention Provide education and explanation on how to use Dyspnea scale       Expected Outcomes Short Term: Able to use Dyspnea scale daily in rehab to express subjective sense of shortness of breath during exertion;Long Term: Able to use Dyspnea scale to guide intensity level when exercising independently       Knowledge and understanding of Target Heart Rate Range (THRR) Yes       Intervention Provide education and explanation of THRR including how the numbers were predicted and where they are located for reference       Expected Outcomes Short Term: Able to state/look up THRR;Short Term: Able to use daily as guideline for intensity in rehab;Long Term: Able to use THRR to govern intensity when exercising independently       Able to check pulse independently Yes       Intervention Provide education and demonstration on how to check pulse in carotid and radial arteries.;Review the importance of being able to check your own pulse for safety during independent exercise       Expected Outcomes Short Term: Able to explain why pulse checking is important during independent exercise;Long Term: Able to check pulse independently and accurately       Understanding of Exercise Prescription Yes       Intervention Provide education, explanation, and written materials on patient's individual exercise prescription       Expected Outcomes Short Term: Able to  explain program exercise prescription;Long Term: Able to explain home exercise prescription to exercise independently          Exercise Goals Re-Evaluation :  Exercise Goals Re-Evaluation     Row Name 05/13/24 1127 05/29/24 1017 06/11/24 1456 06/17/24 1114 06/26/24 1139     Exercise Goal Re-Evaluation   Exercise Goals Review Increase Physical Activity;Able to understand and use rate of perceived exertion (RPE) scale;Understanding of Exercise Prescription;Knowledge and understanding of Target Heart Rate Range (THRR);Increase Strength and Stamina;Able to understand and use Dyspnea scale;Able to check pulse independently Increase Physical Activity;Increase Strength and Stamina;Understanding of Exercise Prescription Increase Physical Activity;Increase Strength and Stamina;Understanding of Exercise Prescription Increase Physical Activity;Increase Strength and Stamina;Understanding of Exercise Prescription Increase Physical Activity;Able to understand and use Dyspnea scale;Understanding of Exercise Prescription;Increase Strength and Stamina;Knowledge and understanding of Target Heart Rate Range (THRR);Able to understand and use rate of perceived exertion (RPE) scale;Able to check pulse independently   Comments Reviewed RPE and dyspnea scale, THR and program prescription with pt today.  Pt voiced understanding and was given a copy of goals to take home. Ruari is off to a good start in rehab, and was able to attend his first few sessions during this review period. He has been able to increase from level 1 to 5 on the T4 nustep, and increase from 20 to 25 laps on the track. We will continue to monitor his progress in the program. Lucile is doing well in rehab. He increased to level 4 on the biostep. He maintained level 5 on the T4 nustep and level 1.5 on the arm  ergometer. He decreased his laps slightly from 23 to 17 laps. We will continue to monitor his progress in the program. Taiten is doing well here at  rehab. He is not doing any exercise at home, but 3 days here at rehab. ENcouraged him to make sure he can continue intentional exercise post rehab. Reviewed home exercise with pt today.  Pt plans to walk at home for exercise. He plans to add in 1 day of exercise at home.  Reviewed THR, pulse, RPE, sign and symptoms, pulse oximetery and when to call 911 or MD.  Also discussed weather considerations and indoor options.  Pt voiced understanding.   Expected Outcomes Short: Use RPE daily to regulate intensity. Long: Follow program prescription in THR. Short: Continue to follow exercise prescription. Long: Continue exercise to improve strength and stamina. Short: Try and get back to 23 laps on the track. Long: Continue exercise to improve strength and stamina. STG: Attend rehab and look for post rehab exercise plan. LTG: Continue exercise to improve strength and stamina. Short: Add 1 additional day of exercise at home. Long: Continue to exercise at home independently.    Row Name 06/27/24 1723 07/08/24 1123 07/10/24 1131         Exercise Goal Re-Evaluation   Exercise Goals Review Increase Physical Activity;Understanding of Exercise Prescription;Increase Strength and Stamina Increase Physical Activity;Increase Strength and Stamina;Understanding of Exercise Prescription Increase Physical Activity;Increase Strength and Stamina;Understanding of Exercise Prescription     Comments Giann continues to do well in rehab. He increased to level 2.8 on the arm ergometer. He maintained level 5 on the T4 nustep for 30 minutes. He also added the T5 nustep and level 1. We will continue to monitor his progress in the program. [name] improved on his walk test by 15%. He plans to keep walking at home upon graduation from cardiac rehab. Sheffield continues to do well in rehab. He was recently able to increase his level on the Arm ergometer from level 2.8 to 3.5. He was also able to maintain level 5 on the T4 nustep. We will continue  to monitor his progress in the program.     Expected Outcomes Short: Continue to progressively increase nustep workloads. Long: Continue exercise to improve strength and stamina. Short: graduate   Long: maintain independent exercise routine independently. Short: Graduate. Long: Continue exercise to improve strength and stamina.        Discharge Exercise Prescription (Final Exercise Prescription Changes):  Exercise Prescription Changes - 07/10/24 1100       Response to Exercise   Blood Pressure (Admit) 142/84    Blood Pressure (Exit) 124/62    Heart Rate (Admit) 65 bpm    Heart Rate (Exercise) 91 bpm    Heart Rate (Exit) 84 bpm    Oxygen Saturation (Admit) 98 %    Oxygen Saturation (Exercise) 94 %    Oxygen Saturation (Exit) 96 %    Rating of Perceived Exertion (Exercise) 14    Symptoms none    Duration Continue with 30 min of aerobic exercise without signs/symptoms of physical distress.    Intensity THRR unchanged      Progression   Progression Continue to progress workloads to maintain intensity without signs/symptoms of physical distress.    Average METs 2.1      Resistance Training   Training Prescription Yes    Weight 3lb    Reps 10-15      Interval Training   Interval Training No  NuStep   Level 5    Minutes 15    METs 3.5      Arm Ergometer   Level 3.5    Minutes 15    METs 1      Home Exercise Plan   Plans to continue exercise at Home (comment)   walking   Frequency Add 1 additional day to program exercise sessions.    Initial Home Exercises Provided 06/26/24      Oxygen   Maintain Oxygen Saturation 88% or higher          Nutrition:  Target Goals: Understanding of nutrition guidelines, daily intake of sodium 1500mg , cholesterol 200mg , calories 30% from fat and 7% or less from saturated fats, daily to have 5 or more servings of fruits and vegetables.  Education: Nutrition 1 -Group instruction provided by verbal, written material, interactive  activities, discussions, models, and posters to present general guidelines for heart healthy nutrition including macronutrients, label reading, and promoting whole foods over processed counterparts. Education serves as Pensions consultant of discussion of heart healthy eating for all. Written material provided at class time. Flowsheet Row Cardiac Rehab from 07/17/2024 in [facility] Cardiac and Pulmonary Rehab  Date 07/17/24  Educator jg  Instruction Review Code 1- Verbalizes Understanding     Education: Nutrition 2 -Group instruction provided by verbal, written material, interactive activities, discussions, models, and posters to present general guidelines for heart healthy nutrition including sodium, cholesterol, and saturated fat. Providing guidance of habit forming to improve blood pressure, cholesterol, and body weight. Written material provided at class time. Flowsheet Row Cardiac Rehab from 07/17/2024 in [facility] Cardiac and Pulmonary Rehab  Date 05/15/24  Educator jg  Instruction Review Code 1- Verbalizes Understanding      Biometrics:  Pre Biometrics - 05/01/24 1520       Pre Biometrics   Height 5' 9.72 (1.771 m)    Weight 236 lb 8 oz (107.3 kg)    Waist Circumference 48 inches    Hip Circumference 47.9 inches    Waist to Hip Ratio 1 %    BMI (Calculated) 34.2    Single Leg Stand 4 seconds          Post Biometrics - 07/08/24 1125        Post  Biometrics   Height 5' 9.72 (1.771 m)    Weight 240 lb 6.4 oz (109 kg)    Waist Circumference 47 inches    Hip Circumference 45 inches    Waist to Hip Ratio 1.04 %    BMI (Calculated) 34.77    Single Leg Stand 8.37 seconds          Nutrition Therapy Plan and Nutrition Goals:  Nutrition Therapy & Goals - 05/01/24 1524       Nutrition Therapy   Diet Cardiac, Low Na    Protein (specify units) 70-90    Fiber 30 grams    Whole Grain Foods 3 servings    Saturated Fats 15 max. grams    Fruits and Vegetables 5 servings/day    Sodium 2  grams      Personal Nutrition Goals   Nutrition Goal Eat 15-30gProtein and 30-60gCarbs at each meal.    Personal Goal #2 Read labels and reduce sodium intake to below 2300mg . Ideally 1500mg  per day.    Personal Goal #3 Reduce saturated fat, less than 12g per day. Replace bad fats for more heart healthy fats.    Comments Patient drinking mostly water, some cranberry juice in the mornings.  He also likes whole milk. Spoke with him about saturated fat and sodium. He says he used to salt his food but knows its bad, so he stopped. Reviewed a facts labels and went over how to read labels with guideline limits of less than 12g saturated fat and less than 1500mg  sodium daily. Provided mediterranean diet handout. Educated on types of fats, sources, and how to read labels. Brainstormed some small meals and snack ideas focusing on prioritizing protein without excessive sodium and saturated fat intake.      Intervention Plan   Intervention Prescribe, educate and counsel regarding individualized specific dietary modifications aiming towards targeted core components such as weight, hypertension, lipid management, diabetes, heart failure and other comorbidities.;Nutrition handout(s) given to patient.    Expected Outcomes Long Term Goal: Adherence to prescribed nutrition plan.;Short Term Goal: A plan has been developed with personal nutrition goals set during dietitian appointment.;Short Term Goal: Understand basic principles of dietary content, such as calories, fat, sodium, cholesterol and nutrients.          Nutrition Assessments:  MEDIFICTS Score Key: >=70 Need to make dietary changes  40-70 Heart Healthy Diet <= 40 Therapeutic Level Cholesterol Diet  Flowsheet Row Cardiac Rehab from 07/15/2024 in [facility] Cardiac and Pulmonary Rehab  Picture Your Plate Total Score on Admission 59  Picture Your Plate Total Score on Discharge 63   Picture Your Plate Scores: <59 Unhealthy dietary pattern with much room for  improvement. 41-50 Dietary pattern unlikely to meet recommendations for good health and room for improvement. 51-60 More healthful dietary pattern, with some room for improvement.  >60 Healthy dietary pattern, although there may be some specific behaviors that could be improved.    Nutrition Goals Re-Evaluation:  Nutrition Goals Re-Evaluation     Row Name 06/17/24 1118 07/08/24 1137           Goals   Comment Hobart reports he has been eating a lot of bread which has caused him to gain some weight lately. Spoke with him about carb portions and choosing good carb foods. Reviewed topics discussed in RD appointment around sodium and saturated fats. Branden reports that he still eats bread and salt and doesn't read food labels. However he has triend to incorporate more green vegetables into his diet. He also tries to eat balanced meals with lean protein sources. He will graduate from cardiac rehab soon. He was encouraged to start reading food labels and be mindful of carb and sodium intake.      Expected Outcome STG: Control carb portions and monitor sodium/saturated fat intake. LTG: Follow a heart healthy diet Short: graduate from cardiac rehab. Long: continue to make heart healthy changes in diet.         Nutrition Goals Discharge (Final Nutrition Goals Re-Evaluation):  Nutrition Goals Re-Evaluation - 07/08/24 1137       Goals   Comment King reports that he still eats bread and salt and doesn't read food labels. However he has triend to incorporate more green vegetables into his diet. He also tries to eat balanced meals with lean protein sources. He will graduate from cardiac rehab soon. He was encouraged to start reading food labels and be mindful of carb and sodium intake.    Expected Outcome Short: graduate from cardiac rehab. Long: continue to make heart healthy changes in diet.          Psychosocial: Target Goals: Acknowledge presence or absence of significant depression and/or  stress, maximize coping skills, provide positive support  system. Participant is able to verbalize types and ability to use techniques and skills needed for reducing stress and depression.   Education: Stress, Anxiety, and Depression - Group verbal and visual presentation to define topics covered.  Reviews how body is impacted by stress, anxiety, and depression.  Also discusses healthy ways to reduce stress and to treat/manage anxiety and depression. Written material provided at class time.   Education: Sleep Hygiene -Provides group verbal and written instruction about how sleep can affect your health.  Define sleep hygiene, discuss sleep cycles and impact of sleep habits. Review good sleep hygiene tips.   Initial Review & Psychosocial Screening:  Initial Psych Review & Screening - 04/29/24 1504       Initial Review   Current issues with None Identified      Family Dynamics   Good Support System? Yes    Comments He can look to his wife for support and states no mental instability.      Barriers   Psychosocial barriers to participate in program The patient should benefit from training in stress management and relaxation.;There are no identifiable barriers or psychosocial needs.      Screening Interventions   Interventions To provide support and resources with identified psychosocial needs;Provide feedback about the scores to participant;Encouraged to exercise    Expected Outcomes Short Term goal: Utilizing psychosocial counselor, staff and physician to assist with identification of specific Stressors or current issues interfering with healing process. Setting desired goal for each stressor or current issue identified.;Long Term Goal: Stressors or current issues are controlled or eliminated.;Short Term goal: Identification and review with participant of any Quality of Life or Depression concerns found by scoring the questionnaire.;Long Term goal: The participant improves quality of Life and  PHQ9 Scores as seen by post scores and/or verbalization of changes          Quality of Life Scores:   Quality of Life - 07/15/24 1131       Quality of Life Scores   Health/Function Pre 28.8 %    Health/Function Post 28.8 %    Health/Function % Change 0 %    Socioeconomic Pre 27 %    Socioeconomic Post 23.56 %    Socioeconomic % Change  -12.74 %    Psych/Spiritual Pre 30 %    Psych/Spiritual Post 28.93 %    Psych/Spiritual % Change -3.57 %    Family Pre 30 %    Family Post 27.6 %    Family % Change -8 %    GLOBAL Pre 28.8 %    GLOBAL Post 27.46 %    GLOBAL % Change -4.65 %         Scores of 19 and below usually indicate a poorer quality of life in these areas.  A difference of  2-3 points is a clinically meaningful difference.  A difference of 2-3 points in the total score of the Quality of Life Index has been associated with significant improvement in overall quality of life, self-image, physical symptoms, and general health in studies assessing change in quality of life.  PHQ-9: Review Flowsheet  More data exists      07/15/2024 05/01/2024 06/09/2022 05/04/2022 10/18/2021  Depression screen PHQ 2/9  Decreased Interest 0 0 0 0 0  Down, Depressed, Hopeless 0 0 0 0 0  PHQ - 2 Score 0 0 0 0 0  Altered sleeping 3 3 0 - -  Tired, decreased energy 0 0 0 - -  Change in appetite  0 0 0 - -  Feeling bad or failure about yourself  0 0 0 - -  Trouble concentrating 0 0 0 - -  Moving slowly or fidgety/restless 0 0 0 - -  Suicidal thoughts 0 0 0 - -  PHQ-9 Score 3 3 0 - -  Difficult doing work/chores Not difficult at all Not difficult at all Not difficult at all - -   Interpretation of Total Score  Total Score Depression Severity:  1-4 = Minimal depression, 5-9 = Mild depression, 10-14 = Moderate depression, 15-19 = Moderately severe depression, 20-27 = Severe depression   Psychosocial Evaluation and Intervention:  Psychosocial Evaluation - 04/29/24 1505       Psychosocial  Evaluation & Interventions   Interventions Encouraged to exercise with the program and follow exercise prescription;Relaxation education;Stress management education    Comments He can look to his wife for support and states no mental instability.    Expected Outcomes Short: Start [facility] to help with mood. Long: Maintain a healthy mental state    Continue Psychosocial Services  Follow up required by staff          Psychosocial Re-Evaluation:  Psychosocial Re-Evaluation     Row Name 06/17/24 1115 07/08/24 1129           Psychosocial Re-Evaluation   Current issues with None Identified None Identified      Comments Jahmari denies any stress, anxiety, or depression at this time. He says he sleeps well, does wake up a few times at night. Says he gets 8-10hr of sleep total each night. Kaycen reports no changes with sleep, stress, or mental health. He states that he doesn't sleep well but does manage and takes naps during the day. He will be graduating from cardiac rehab soon.      Expected Outcomes STG: Attend rehab regularly. LTG: Achieve and maintain positive outlook on health and daily life Short: graduate. Long: maintain good mental health routine.      Interventions Encouraged to attend Cardiac Rehabilitation for the exercise Encouraged to attend Cardiac Rehabilitation for the exercise      Continue Psychosocial Services  Follow up required by staff Follow up required by staff         Psychosocial Discharge (Final Psychosocial Re-Evaluation):  Psychosocial Re-Evaluation - 07/08/24 1129       Psychosocial Re-Evaluation   Current issues with None Identified    Comments Ignatius reports no changes with sleep, stress, or mental health. He states that he doesn't sleep well but does manage and takes naps during the day. He will be graduating from cardiac rehab soon.    Expected Outcomes Short: graduate. Long: maintain good mental health routine.    Interventions Encouraged to attend  Cardiac Rehabilitation for the exercise    Continue Psychosocial Services  Follow up required by staff          Vocational Rehabilitation: Provide vocational rehab assistance to qualifying candidates.   Vocational Rehab Evaluation & Intervention:   Education: Education Goals: Education classes will be provided on a variety of topics geared toward better understanding of heart health and risk factor modification. Participant will state understanding/return demonstration of topics presented as noted by education test scores.  Learning Barriers/Preferences:  Learning Barriers/Preferences - 04/29/24 1504       Learning Barriers/Preferences   Learning Barriers --          General Cardiac Education Topics:  AED/CPR: - Group verbal and written instruction with the use of models  to demonstrate the basic use of the AED with the basic ABC's of resuscitation.   Test and Procedures: - Group verbal and visual presentation and models provide information about basic cardiac anatomy and function. Reviews the testing methods done to diagnose heart disease and the outcomes of the test results. Describes the treatment choices: Medical Management, Angioplasty, or Coronary Bypass Surgery for treating various heart conditions including Myocardial Infarction, Angina, Valve Disease, and Cardiac Arrhythmias. Written material provided at class time. Flowsheet Row Cardiac Rehab from 07/17/2024 in [facility] Cardiac and Pulmonary Rehab  Date 05/22/24  Educator KB  Instruction Review Code 1- Verbalizes Understanding    Medication Safety: - Group verbal and visual instruction to review commonly prescribed medications for heart and lung disease. Reviews the medication, class of the drug, and side effects. Includes the steps to properly store meds and maintain the prescription regimen. Written material provided at class time. Flowsheet Row Cardiac Rehab from 07/17/2024 in [facility] Cardiac and Pulmonary Rehab  Date  05/29/24  Educator KB  Instruction Review Code 1- Verbalizes Understanding    Intimacy: - Group verbal instruction through game format to discuss how heart and lung disease can affect sexual intimacy. Written material provided at class time. Flowsheet Row Cardiac Rehab from 07/17/2024 in [facility] Cardiac and Pulmonary Rehab  Date 07/10/24  Educator nt  Instruction Review Code 1- TEFL teacher Understanding    Know Your Numbers and Heart Failure: - Group verbal and visual instruction to discuss disease risk factors for cardiac and pulmonary disease and treatment options.  Reviews associated critical values for Overweight/Obesity, Hypertension, Cholesterol, and Diabetes.  Discusses basics of heart failure: signs/symptoms and treatments.  Introduces Heart Failure Zone chart for action plan for heart failure. Written material provided at class time. Flowsheet Row Cardiac Rehab from 07/17/2024 in [facility] Cardiac and Pulmonary Rehab  Date 06/05/24  Educator KB  Instruction Review Code 1- Verbalizes Understanding    Infection Prevention: - Provides verbal and written material to individual with discussion of infection control including proper hand washing and proper equipment cleaning during exercise session. Flowsheet Row Cardiac Rehab from 07/17/2024 in [facility] Cardiac and Pulmonary Rehab  Date 05/01/24  Educator MB  Instruction Review Code 1- Verbalizes Understanding    Falls Prevention: - Provides verbal and written material to individual with discussion of falls prevention and safety. Flowsheet Row Cardiac Rehab from 07/17/2024 in [facility] Cardiac and Pulmonary Rehab  Date 05/01/24  Educator MB  Instruction Review Code 1- Verbalizes Understanding    Other: -Provides group and verbal instruction on various topics (see comments)   Knowledge Questionnaire Score:  Knowledge Questionnaire Score - 07/15/24 1131       Knowledge Questionnaire Score   Pre Score 23/26    Post Score 25/26           Core Components/Risk Factors/Patient Goals at Admission:  Personal Goals and Risk Factors at Admission - 05/01/24 1521       Core Components/Risk Factors/Patient Goals on Admission    Weight Management Yes;Weight Loss    Intervention Weight Management: Develop a combined nutrition and exercise program designed to reach desired caloric intake, while maintaining appropriate intake of nutrient and fiber, sodium and fats, and appropriate energy expenditure required for the weight goal.;Weight Management: Provide education and appropriate resources to help participant work on and attain dietary goals.;Weight Management/Obesity: Establish reasonable short term and long term weight goals.;Obesity: Provide education and appropriate resources to help participant work on and attain dietary goals.    Admit Weight  236 lb 8 oz (107.3 kg)    Goal Weight: Short Term 218 lb (98.9 kg)    Goal Weight: Long Term 200 lb (90.7 kg)    Expected Outcomes Short Term: Continue to assess and modify interventions until short term weight is achieved;Long Term: Adherence to nutrition and physical activity/exercise program aimed toward attainment of established weight goal;Weight Loss: Understanding of general recommendations for a balanced deficit meal plan, which promotes 1-2 lb weight loss per week and includes a negative energy balance of [phone] kcal/d;Understanding recommendations for meals to include 15-35% energy as protein, 25-35% energy from fat, 35-60% energy from carbohydrates, less than 200mg  of dietary cholesterol, 20-35 gm of total fiber daily;Understanding of distribution of calorie intake throughout the day with the consumption of 4-5 meals/snacks    Diabetes Yes    Intervention Provide education about signs/symptoms and action to take for hypo/hyperglycemia.;Provide education about proper nutrition, including hydration, and aerobic/resistive exercise prescription along with prescribed medications to achieve  blood glucose in normal ranges: Fasting glucose 65-99 mg/dL    Expected Outcomes Short Term: Participant verbalizes understanding of the signs/symptoms and immediate care of hyper/hypoglycemia, proper foot care and importance of medication, aerobic/resistive exercise and nutrition plan for blood glucose control.;Long Term: Attainment of HbA1C < 7%.    Hypertension Yes    Intervention Provide education on lifestyle modifcations including regular physical activity/exercise, weight management, moderate sodium restriction and increased consumption of fresh fruit, vegetables, and low fat dairy, alcohol moderation, and smoking cessation.;Monitor prescription use compliance.    Expected Outcomes Short Term: Continued assessment and intervention until BP is < 140/63mm HG in hypertensive participants. < 130/48mm HG in hypertensive participants with diabetes, heart failure or chronic kidney disease.;Long Term: Maintenance of blood pressure at goal levels.    Lipids Yes    Intervention Provide education and support for participant on nutrition & aerobic/resistive exercise along with prescribed medications to achieve LDL 70mg , HDL >40mg .    Expected Outcomes Short Term: Participant states understanding of desired cholesterol values and is compliant with medications prescribed. Participant is following exercise prescription and nutrition guidelines.;Long Term: Cholesterol controlled with medications as prescribed, with individualized exercise RX and with personalized nutrition plan. Value goals: LDL < 70mg , HDL > 40 mg.          Education:Diabetes - Individual verbal and written instruction to review signs/symptoms of diabetes, desired ranges of glucose level fasting, after meals and with exercise. Acknowledge that pre and post exercise glucose checks will be done for 3 sessions at entry of program. Flowsheet Row Cardiac Rehab from 07/17/2024 in [facility] Cardiac and Pulmonary Rehab  Date 05/01/24  Educator MB   Instruction Review Code 1- Verbalizes Understanding    Core Components/Risk Factors/Patient Goals Review:   Goals and Risk Factor Review     Row Name 06/17/24 1121 07/08/24 1139           Core Components/Risk Factors/Patient Goals Review   Personal Goals Review Hypertension;Weight Management/Obesity Lipids;Hypertension;Diabetes      Review Robey reports he has been trying to eat less junk food and brinf his weight down some. he says he take shis meds as prescribed and monitors his BP. Rosemary reports that he does not check blood sugars at home and occationally checks blood pressure. He was encouraged to start being more consistent with checking BP and bood sugars at home especially since he is graduating cardiac rehab soon. He does report taking all medications and following up with his doctor to monitor his cholesterol,  A1C, and blood pressure.      Expected Outcomes STG: Check BP at home and choose more whole foods with measured portions. LTG: manage risk factors independently Short: start cheing blood sugar and blood pressure at home. Graduate from cardiac rehab. Long: control cardiac risk factors.         Core Components/Risk Factors/Patient Goals at Discharge (Final Review):   Goals and Risk Factor Review - 07/08/24 1139       Core Components/Risk Factors/Patient Goals Review   Personal Goals Review Lipids;Hypertension;Diabetes    Review Zalman reports that he does not check blood sugars at home and occationally checks blood pressure. He was encouraged to start being more consistent with checking BP and bood sugars at home especially since he is graduating cardiac rehab soon. He does report taking all medications and following up with his doctor to monitor his cholesterol, A1C, and blood pressure.    Expected Outcomes Short: start cheing blood sugar and blood pressure at home. Graduate from cardiac rehab. Long: control cardiac risk factors.          ITP Comments:  ITP  Comments     Row Name 04/29/24 1506 05/01/24 1515 05/13/24 1128 05/22/24 0753 06/19/24 0912   ITP Comments Virtual Visit completed. Patient informed on EP and RD appointment and 6 Minute walk test. Patient also informed of patient health questionnaires on My Chart. Patient Verbalizes understanding. Visit diagnosis can be found in [facility] 04/09/2024. Completed and gym orientation for cardiac rehab. Initial ITP created and sent for review to Dr. Oneil Pinal, Medical Director. First full day of exercise!  Patient was oriented to gym and equipment including functions, settings, policies, and procedures.  Patient's individual exercise prescription and treatment plan were reviewed.  All starting workloads were established based on the results of the 6 minute walk test done at initial orientation visit.  The plan for exercise progression was also introduced and progression will be customized based on patient's performance and goals. 30 Day review completed. Medical Director ITP review done, changes made as directed, and signed approval by Medical Director. New to Program. 30 Day review completed. Medical Director ITP review done, changes made as directed, and signed approval by Medical Director.    Row Name 07/17/24 0808 07/17/24 1110         ITP Comments 30 Day review completed. Medical Director ITP review done, changes made as directed, and signed approval by Medical Director. Ladislao graduated today from  rehab with 36 sessions completed.  Details of the patient's exercise prescription and what He needs to do in order to continue the prescription and progress were discussed with patient.  Patient was given a copy of prescription and goals.  Patient verbalized understanding. Taino plans to continue to exercise by walking.         Comments: Discharge ITP

## 2024-07-17 NOTE — Progress Notes (Signed)
 Daily Session Note  Patient Details  Name: Andrew Lyons MRN: [id number] Date of Birth: 12-31-1945 Referring Provider:   Flowsheet Row Cardiac Rehab from 05/01/2024 in [facility]  Referring Provider Andrew Fillers, MD    Encounter Date: 07/17/2024  Check In:  Session Check In - 07/17/24 1108       Check-In   Supervising physician immediately available to respond to emergencies See telemetry face sheet for immediately available ER MD    Location [facility]    Staff Present Burnard Hint BS, ACSM CEP, Exercise Physiologist;Noah Tickle, BS, Exercise Physiologist;Joseph Rolinda RCP,RRT,BSRT;Deolinda Frid RN,BSN    Virtual Visit No    Medication changes reported     No    Fall or balance concerns reported    No    Tobacco Cessation No Change    Warm-up and Cool-down Performed on first and last piece of equipment    Resistance Training Performed Yes    VAD Patient? No    PAD/SET Patient? No      Pain Assessment   Currently in Pain? No/denies             Social History   Tobacco Use  Smoking Status Former   Current packs/day: 0.00   Types: Cigarettes   Quit date: 10/02/2001   Years since quitting: 22.8   Passive exposure: Past  Smokeless Tobacco Former  Tobacco Comments   Quit smoking in 2003; Started smoking at age 65, smoked about 40 years, smoked over 3 packs per day    Goals Met:  Independence with exercise equipment Exercise tolerated well No report of concerns or symptoms today Strength training completed today  Goals Unmet:  Not Applicable  Comments:  Andrew Lyons graduated today from  rehab with 36 sessions completed.  Details of the patient's exercise prescription and what He needs to do in order to continue the prescription and progress were discussed with patient.  Patient was given a copy of prescription and goals.  Patient verbalized understanding. Andrew Lyons plans to continue to exercise by walking.    Dr. Oneil Pinal  is Medical Director for [facility].  Dr. Fuad Aleskerov is Medical Director for [facility].

## 2024-07-17 NOTE — Progress Notes (Signed)
 Cardiac Individual Treatment Plan  Patient Details  Name: ZADEN SAKO MRN: [id number] Date of Birth: 06/28/1946 Referring Provider:   Flowsheet Row Cardiac Rehab from 05/01/2024 in [facility] Cardiac and Pulmonary Rehab  Referring Provider Wilburn Fillers, MD    Initial Encounter Date:  Flowsheet Row Cardiac Rehab from 05/01/2024 in [facility] Cardiac and Pulmonary Rehab  Date 05/01/24    Visit Diagnosis: Status post coronary artery stent placement  Patient's Home Medications on Admission:  Current Outpatient Medications:    amLODipine  (NORVASC ) 5 MG tablet, Take 5 mg by mouth., Disp: , Rfl:    amLODipine -benazepril  (LOTREL) 5-40 MG capsule, Take 1 capsule by mouth daily. (Patient not taking: Reported on 04/29/2024), Disp: 90 capsule, Rfl: 1   aspirin  EC 81 MG tablet, Take 81 mg by mouth., Disp: , Rfl:    cholecalciferol  (VITAMIN D ) 25 MCG (1000 UT) tablet, Take 1,000 Units by mouth daily. , Disp: , Rfl:    clopidogrel  (PLAVIX ) 75 MG tablet, TAKE 1 TABLET BY MOUTH ONCE A DAY, Disp: 90 tablet, Rfl: 0   Coenzyme Q10 100 MG TABS, Take 100 mg by mouth daily. , Disp: , Rfl:    fluticasone  (FLONASE ) 50 MCG/ACT nasal spray, USE 2 SPRAYS INTO EACH NOSTRIL EVERY DAY, Disp: 48 g, Rfl: 5   JARDIANCE  25 MG TABS tablet, TAKE 1 TABLET BY MOUTH ONCE DAILY, Disp: 90 tablet, Rfl: 3   Lancets MISC, 1 each., Disp: , Rfl:    metFORMIN  (GLUCOPHAGE ) 500 MG tablet, TAKE 1 TABLET BY MOUTH TWICE A DAY WITH A MEAL, Disp: 180 tablet, Rfl: 1   metoprolol  succinate (TOPROL -XL) 100 MG 24 hr tablet, TAKE 1 TABLET BY MOUTH ONCE A DAY WITH OR IMMEDIATELY FOLLOWING A MEAL, Disp: 90 tablet, Rfl: 3   montelukast  (SINGULAIR ) 10 MG tablet, TAKE ONE TABLET BY MOUTH DAILY, Disp: 90 tablet, Rfl: 3   nitroGLYCERIN  (NITROSTAT ) 0.4 MG SL tablet, Place under the tongue., Disp: , Rfl:    omeprazole  (PRILOSEC) 40 MG capsule, TAKE ONE CAPSULE BY MOUTH DAILY, Disp: 90 capsule, Rfl: 3   polyethylene glycol (MIRALAX / GLYCOLAX) 17 g packet, Take  17 g by mouth., Disp: , Rfl:    predniSONE  (DELTASONE ) 10 MG tablet, Take by mouth., Disp: , Rfl:    predniSONE  (DELTASONE ) 50 MG tablet, SMARTSIG:1 Tablet(s) By Mouth Every Evening, Disp: , Rfl:    rosuvastatin  (CRESTOR ) 20 MG tablet, TAKE 1 TABLET BY MOUTH ONCE A DAY (Patient not taking: Reported on 04/29/2024), Disp: 90 tablet, Rfl: 3   rosuvastatin  (CRESTOR ) 20 MG tablet, Take 20 mg by mouth., Disp: , Rfl:    senna-docusate (SENOKOT-S) 8.6-50 MG tablet, Take 1 tablet by mouth., Disp: , Rfl:    sildenafil (VIAGRA) 100 MG tablet, Take 100 mg by mouth as needed for erectile dysfunction., Disp: , Rfl:    tirzepatide  (MOUNJARO ) 7.5 MG/0.5ML Pen, Inject 7.5 mg into the skin once a week., Disp: 6 mL, Rfl: 1   vitamin B-12 (CYANOCOBALAMIN ) 1000 MCG tablet, Take 1,000 mcg by mouth daily., Disp: , Rfl:   Past Medical History: Past Medical History:  Diagnosis Date   Adenomatous polyps    Aortic atherosclerosis    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    a.) severe; s/p AVR  03/29/2011   Arthritis    Barrett's esophagus with dysplasia    Bilateral carotid artery disease    BPH (benign prostatic hyperplasia)    Cataract    COPD (chronic obstructive pulmonary disease) (HCC)  Coronary artery disease 02/12/2002   a.) LHC 02/12/02: 40 pRCA, 80/90 mRCA, 40 dRCA, 50/70 RPDA, 20 pLCx, 20 mLCx, 30 OM1, 40/60 mLAD -> Tx to [facility]; b.) comp PCI 02/13/02: 2.5 x 13mm DES (unk type) to p-mLAD, 2.25 x 7mm BiodivYsio DES to RPDA, 2.25 x 23mm + 3.0 x 18mm + 3.0 x 13mm (unk type); c.) LHC 02/14/11: 25 pLAD-1, 10 ISR pLAD-2, 30 mLAD, 30 mLCx, 30 pRCA, 40 mRCA, 30 dRCA - med mgmt; c.) MV 10/29/19: no isch; d.) MV 05/03/21: no isch   Diastolic dysfunction    a.) TTE 04/05/2017: EF >55%, mild LVH, G1DD, midl LAE, mild MR/TR/PR, biopros AoV (MPG 10); TTE 12/04/2019: EF >55%, mild LVH, G1DD, mild BAE, mild RVE, triv TR/PR, mild MR, biopros AoV (MPG 12); c.) TTE 04/30/2021: EF >55%, mild LVH, G1DD, mod LAE, triv MR/TR,  biopros AoV;  d.) TTE 07/24/2023: EF >55%, G1DD, triv panval regurgitation, mild AS (AVA = 0.8 cm2)   ED (erectile dysfunction)    a.) on PDE5i (tadalafil )   GERD (gastroesophageal reflux disease)    History of 2019 novel coronavirus disease (COVID-19)    Hx of BKA, right (HCC)    Hx of dysplastic nevus 10/19/2006   L upper back, medial to mid scapula, slight to moderate atypia   Hx of dysplastic nevus 03/18/2008   R upper back 4.0cm lat to spine, moderate to marked atypia, excised 04/15/2008   Hx of dysplastic nevus 09/22/2008   R mid back 5.0cm lat to spine, moderate atypia   Hx of dysplastic nevus 04/06/2009   L mid to low back 11.0cm lat to spine, mild atypia   Hyperlipidemia    Hypertension    Internal hemorrhoids    Long term current use of clopidogrel     NSTEMI (non-ST elevated myocardial infarction) (HCC) 02/12/2002   a.) LHC 02/12/2002: complex multi-vessel CAD--> transferred to [facility]; b.) complex PCI 02/13/2002: 2.5 x 13 mm DES (unknown type) to p-mLAD, 2.25 x 7 mm BiodivYsio DES to RPDA, serial 2.25 x 23 mm + 3.0 x 18 mm + 3.0 x 13 mm to mRCA (unknown type)   OAB (overactive bladder)    OSA on CPAP    Peripheral vascular disease    a.) s/p RIGHT BKA   Presence of stent in coronary artery    a.) TOTAL of 5 as of 07/27/2023 (p-mLAD x1, RPDA x 1, mRCA x 3)   S/P AVR (aortic valve replacement) 03/29/2011   a.) 25 mm Magna pericardial tissue bioprosthetic valve   Seizures (HCC)    Statin induced/mediated hepatitis    T2DM (type 2 diabetes mellitus) (HCC)     Tobacco Use: Social History   Tobacco Use  Smoking Status Former   Current packs/day: 0.00   Types: Cigarettes   Quit date: 10/02/2001   Years since quitting: 22.8   Passive exposure: Past  Smokeless Tobacco Former  Tobacco Comments   Quit smoking in 2003; Started smoking at age 78, smoked about 40 years, smoked over 3 packs per day    Labs: Review Flowsheet  More data exists      Latest Ref Rng & Units  06/09/2021 11/29/2021 04/13/2022 08/10/2022 03/11/2024  Labs for ITP Cardiac and Pulmonary Rehab  Cholestrol 100 - 199 mg/dL 841  - 862  - -  LDL (calc) 0 - 99 mg/dL 74  - 69  - -  HDL-C >60 mg/dL 33  - 32  - -  Trlycerides 0 - 149 mg/dL 683  -  219  - -  Hemoglobin A1c 4.0 - 5.6 % 7.2  7.2  7.7  8.0  -  PH, Arterial 7.35 - 7.45 - - - - 7.358   PCO2 arterial 32 - 48 mmHg - - - - 37.2   Bicarbonate 20.0 - 28.0 mmol/L - - - - 20.9  23.8   TCO2 22 - 32 mmol/L - - - - 22  25   Acid-base deficit 0.0 - 2.0 mmol/L - - - - 4.0  2.0   O2 Saturation % - - - - 91  60     Details       Multiple values from one day are sorted in reverse-chronological order          Exercise Target Goals: Exercise Program Goal: Individual exercise prescription set using results from initial 6 min walk test and THRR while considering  patient's activity barriers and safety.   Exercise Prescription Goal: Initial exercise prescription builds to 30-45 minutes a day of aerobic activity, 2-3 days per week.  Home exercise guidelines will be given to patient during program as part of exercise prescription that the participant will acknowledge.   Education: Aerobic Exercise: - Group verbal and visual presentation on the components of exercise prescription. Introduces F.I.T.T principle from ACSM for exercise prescriptions.  Reviews F.I.T.T. principles of aerobic exercise including progression. Written material provided at class time. Flowsheet Row Cardiac Rehab from 07/10/2024 in [facility] Cardiac and Pulmonary Rehab  Date 07/10/24  Educator nt  Instruction Review Code 1- TEFL teacher Understanding    Education: Resistance Exercise: - Group verbal and visual presentation on the components of exercise prescription. Introduces F.I.T.T principle from ACSM for exercise prescriptions  Reviews F.I.T.T. principles of resistance exercise including progression. Written material provided at class time. Flowsheet Row Cardiac Rehab from  07/10/2024 in [facility] Cardiac and Pulmonary Rehab  Date 07/03/24  Educator nt  Instruction Review Code 1- TEFL teacher Understanding     Education: Exercise & Equipment Safety: - Individual verbal instruction and demonstration of equipment use and safety with use of the equipment. Flowsheet Row Cardiac Rehab from 07/10/2024 in [facility] Cardiac and Pulmonary Rehab  Date 05/01/24  Educator MB  Instruction Review Code 1- Verbalizes Understanding    Education: Exercise Physiology & General Exercise Guidelines: - Group verbal and written instruction with models to review the exercise physiology of the cardiovascular system and associated critical values. Provides general exercise guidelines with specific guidelines to those with heart or lung disease. Written material provided at class time. Flowsheet Row Cardiac Rehab from 07/10/2024 in [facility] Cardiac and Pulmonary Rehab  Date 06/26/24  Educator nt  Instruction Review Code 1- TEFL teacher Understanding    Education: Flexibility, Balance, Mind/Body Relaxation: - Group verbal and visual presentation with interactive activity on the components of exercise prescription. Introduces F.I.T.T principle from ACSM for exercise prescriptions. Reviews F.I.T.T. principles of flexibility and balance exercise training including progression. Also discusses the mind body connection.  Reviews various relaxation techniques to help reduce and manage stress (i.e. Deep breathing, progressive muscle relaxation, and visualization). Balance handout provided to take home. Written material provided at class time. Flowsheet Row Cardiac Rehab from 07/10/2024 in [facility] Cardiac and Pulmonary Rehab  Date 07/03/24  Educator nt  Instruction Review Code 1- Verbalizes Understanding    Activity Barriers & Risk Stratification:  Activity Barriers & Cardiac Risk Stratification - 05/01/24 1516       Activity Barriers & Cardiac Risk Stratification   Activity Barriers Other (comment)  Comments Right leg amputee, 5lb lifting restriction    Cardiac Risk Stratification Moderate          6 Minute Walk:  6 Minute Walk     Row Name 05/01/24 1515 07/08/24 1121       6 Minute Walk   Phase Initial Discharge    Distance 860 feet 990 feet    Distance % Change -- 15 %    Distance Feet Change -- 130 ft    Walk Time 6 minutes 6 minutes    # of Rest Breaks 0 0    MPH 1.63 1.88    METS 1.63 1.76    RPE 8 9    Perceived Dyspnea  0 0    VO2 Peak 5.7 6.15    Symptoms No No    Resting HR 92 bpm 71 bpm    Resting BP 130/70 102/60    Resting Oxygen Saturation  96 % 97 %    Exercise Oxygen Saturation  during 6 min walk 97 % 97 %    Max Ex. HR 115 bpm 97 bpm    Max Ex. BP 144/60 162/80    2 Minute Post BP 126/56 130/80       Oxygen Initial Assessment:   Oxygen Re-Evaluation:   Oxygen Discharge (Final Oxygen Re-Evaluation):   Initial Exercise Prescription:  Initial Exercise Prescription - 05/01/24 1500       Date of Initial Exercise RX and Referring Provider   Date 05/01/24    Referring Provider Wilburn Fillers, MD      Oxygen   Maintain Oxygen Saturation 88% or higher      Recumbant Bike   Level 1    RPM 50    Watts 15    Minutes 15    METs 1.63      NuStep   Level 1    SPM 80    Minutes 15    METs 1.63      Arm Ergometer   Level 1    RPM 25    Minutes 15    METs 1.63      Biostep-RELP   Level 1    SPM 50    Minutes 15    METs 1.63      Track   Laps 20    Minutes 15    METs 2.09      Prescription Details   Frequency (times per week) 3    Duration Progress to 30 minutes of continuous aerobic without signs/symptoms of physical distress      Intensity   THRR 40-80% of Max Heartrate 112-132    Ratings of Perceived Exertion 11-13    Perceived Dyspnea 0-4      Progression   Progression Continue to progress workloads to maintain intensity without signs/symptoms of physical distress.      Resistance Training   Training  Prescription Yes    Weight 3 lb    Reps 10-15          Perform Capillary Blood Glucose checks as needed.  Exercise Prescription Changes:   Exercise Prescription Changes     Row Name 05/01/24 1500 05/29/24 1000 06/11/24 1400 06/26/24 1100 06/27/24 1700     Response to Exercise   Blood Pressure (Admit) 130/70 164/76 126/64 -- 114/78   Blood Pressure (Exercise) 144/60 164/78 138/74 -- --   Blood Pressure (Exit) 126/56 134/72 132/72 -- 134/78   Heart Rate (Admit) 92 bpm 83 bpm 90 bpm -- 75 bpm  Heart Rate (Exercise) 115 bpm 116 bpm 115 bpm -- 110 bpm   Heart Rate (Exit) 96 bpm 90 bpm 108 bpm -- 88 bpm   Oxygen Saturation (Admit) 96 % -- -- -- 97 %   Oxygen Saturation (Exercise) 97 % -- -- -- 91 %   Oxygen Saturation (Exit) 97 % -- -- -- 97 %   Rating of Perceived Exertion (Exercise) 8 15 14  -- 13   Perceived Dyspnea (Exercise) 0 0 0 -- --   Symptoms none none none -- none   Comments results first 2 weeks of exercise -- -- --   Duration -- Continue with 30 min of aerobic exercise without signs/symptoms of physical distress. Continue with 30 min of aerobic exercise without signs/symptoms of physical distress. -- Continue with 30 min of aerobic exercise without signs/symptoms of physical distress.   Intensity -- THRR unchanged THRR unchanged -- THRR unchanged     Progression   Progression -- Continue to progress workloads to maintain intensity without signs/symptoms of physical distress. Continue to progress workloads to maintain intensity without signs/symptoms of physical distress. -- Continue to progress workloads to maintain intensity without signs/symptoms of physical distress.   Average METs 1.63 2.03 2.35 -- 2.54     Resistance Training   Training Prescription -- Yes Yes -- Yes   Weight -- 3lb 3lb -- 3lb   Reps -- 10-15 10-15 -- 10-15     Interval Training   Interval Training -- No No -- No     NuStep   Level -- 5 5 -- 5   Minutes -- 15 15 -- 30   METs -- 3 3 --  3.6     Arm Ergometer   Level -- 1.5 1.5 -- 2.8   Minutes -- 15 15 -- 15   METs -- 1 1 -- 2.4     T5 Nustep   Level -- -- -- -- 1   SPM -- -- -- -- 80   Minutes -- -- -- -- 30   METs -- -- -- -- 2     Biostep-RELP   Level -- 1 4 -- --   Minutes -- 15 15 -- --   METs -- 2 3 -- --     Track   Laps -- 23 17 -- --   Minutes -- 15 15 -- --   METs -- 2.25 1.92 -- --     Home Exercise Plan   Plans to continue exercise at -- -- -- Home (comment)  walking Home (comment)  walking   Frequency -- -- -- Add 1 additional day to program exercise sessions. Add 1 additional day to program exercise sessions.   Initial Home Exercises Provided -- -- -- 06/26/24 06/26/24     Oxygen   Maintain Oxygen Saturation -- 88% or higher 88% or higher -- 88% or higher    Row Name 07/10/24 1100             Response to Exercise   Blood Pressure (Admit) 142/84       Blood Pressure (Exit) 124/62       Heart Rate (Admit) 65 bpm       Heart Rate (Exercise) 91 bpm       Heart Rate (Exit) 84 bpm       Oxygen Saturation (Admit) 98 %       Oxygen Saturation (Exercise) 94 %       Oxygen Saturation (Exit) 96 %  Rating of Perceived Exertion (Exercise) 14       Symptoms none       Duration Continue with 30 min of aerobic exercise without signs/symptoms of physical distress.       Intensity THRR unchanged         Progression   Progression Continue to progress workloads to maintain intensity without signs/symptoms of physical distress.       Average METs 2.1         Resistance Training   Training Prescription Yes       Weight 3lb       Reps 10-15         Interval Training   Interval Training No         NuStep   Level 5       Minutes 15       METs 3.5         Arm Ergometer   Level 3.5       Minutes 15       METs 1         Home Exercise Plan   Plans to continue exercise at Home (comment)  walking       Frequency Add 1 additional day to program exercise sessions.       Initial Home  Exercises Provided 06/26/24         Oxygen   Maintain Oxygen Saturation 88% or higher          Exercise Comments:   Exercise Comments     Row Name 05/13/24 1128           Exercise Comments First full day of exercise!  Patient was oriented to gym and equipment including functions, settings, policies, and procedures.  Patient's individual exercise prescription and treatment plan were reviewed.  All starting workloads were established based on the results of the 6 minute walk test done at initial orientation visit.  The plan for exercise progression was also introduced and progression will be customized based on patient's performance and goals.          Exercise Goals and Review:   Exercise Goals     Row Name 05/01/24 1519             Exercise Goals   Increase Physical Activity Yes       Intervention Provide advice, education, support and counseling about physical activity/exercise needs.;Develop an individualized exercise prescription for aerobic and resistive training based on initial evaluation findings, risk stratification, comorbidities and participant's personal goals.       Expected Outcomes Short Term: Attend rehab on a regular basis to increase amount of physical activity.;Long Term: Add in home exercise to make exercise part of routine and to increase amount of physical activity.;Long Term: Exercising regularly at least 3-5 days a week.       Increase Strength and Stamina Yes       Intervention Provide advice, education, support and counseling about physical activity/exercise needs.;Develop an individualized exercise prescription for aerobic and resistive training based on initial evaluation findings, risk stratification, comorbidities and participant's personal goals.       Expected Outcomes Short Term: Increase workloads from initial exercise prescription for resistance, speed, and METs.;Short Term: Perform resistance training exercises routinely during rehab and add in  resistance training at home;Long Term: Improve cardiorespiratory fitness, muscular endurance and strength as measured by increased METs and functional capacity ( )       Able to understand and use rate of perceived  exertion (RPE) scale Yes       Intervention Provide education and explanation on how to use RPE scale       Expected Outcomes Short Term: Able to use RPE daily in rehab to express subjective intensity level;Long Term:  Able to use RPE to guide intensity level when exercising independently       Able to understand and use Dyspnea scale Yes       Intervention Provide education and explanation on how to use Dyspnea scale       Expected Outcomes Short Term: Able to use Dyspnea scale daily in rehab to express subjective sense of shortness of breath during exertion;Long Term: Able to use Dyspnea scale to guide intensity level when exercising independently       Knowledge and understanding of Target Heart Rate Range (THRR) Yes       Intervention Provide education and explanation of THRR including how the numbers were predicted and where they are located for reference       Expected Outcomes Short Term: Able to state/look up THRR;Short Term: Able to use daily as guideline for intensity in rehab;Long Term: Able to use THRR to govern intensity when exercising independently       Able to check pulse independently Yes       Intervention Provide education and demonstration on how to check pulse in carotid and radial arteries.;Review the importance of being able to check your own pulse for safety during independent exercise       Expected Outcomes Short Term: Able to explain why pulse checking is important during independent exercise;Long Term: Able to check pulse independently and accurately       Understanding of Exercise Prescription Yes       Intervention Provide education, explanation, and written materials on patient's individual exercise prescription       Expected Outcomes Short Term: Able to  explain program exercise prescription;Long Term: Able to explain home exercise prescription to exercise independently          Exercise Goals Re-Evaluation :  Exercise Goals Re-Evaluation     Row Name 05/13/24 1127 05/29/24 1017 06/11/24 1456 06/17/24 1114 06/26/24 1139     Exercise Goal Re-Evaluation   Exercise Goals Review Increase Physical Activity;Able to understand and use rate of perceived exertion (RPE) scale;Understanding of Exercise Prescription;Knowledge and understanding of Target Heart Rate Range (THRR);Increase Strength and Stamina;Able to understand and use Dyspnea scale;Able to check pulse independently Increase Physical Activity;Increase Strength and Stamina;Understanding of Exercise Prescription Increase Physical Activity;Increase Strength and Stamina;Understanding of Exercise Prescription Increase Physical Activity;Increase Strength and Stamina;Understanding of Exercise Prescription Increase Physical Activity;Able to understand and use Dyspnea scale;Understanding of Exercise Prescription;Increase Strength and Stamina;Knowledge and understanding of Target Heart Rate Range (THRR);Able to understand and use rate of perceived exertion (RPE) scale;Able to check pulse independently   Comments Reviewed RPE and dyspnea scale, THR and program prescription with pt today.  Pt voiced understanding and was given a copy of goals to take home. Angelina is off to a good start in rehab, and was able to attend his first few sessions during this review period. He has been able to increase from level 1 to 5 on the T4 nustep, and increase from 20 to 25 laps on the track. We will continue to monitor his progress in the program. Sulaiman is doing well in rehab. He increased to level 4 on the biostep. He maintained level 5 on the T4 nustep and level 1.5 on the arm  ergometer. He decreased his laps slightly from 23 to 17 laps. We will continue to monitor his progress in the program. Dow is doing well here at  rehab. He is not doing any exercise at home, but 3 days here at rehab. ENcouraged him to make sure he can continue intentional exercise post rehab. Reviewed home exercise with pt today.  Pt plans to walk at home for exercise. He plans to add in 1 day of exercise at home.  Reviewed THR, pulse, RPE, sign and symptoms, pulse oximetery and when to call 911 or MD.  Also discussed weather considerations and indoor options.  Pt voiced understanding.   Expected Outcomes Short: Use RPE daily to regulate intensity. Long: Follow program prescription in THR. Short: Continue to follow exercise prescription. Long: Continue exercise to improve strength and stamina. Short: Try and get back to 23 laps on the track. Long: Continue exercise to improve strength and stamina. STG: Attend rehab and look for post rehab exercise plan. LTG: Continue exercise to improve strength and stamina. Short: Add 1 additional day of exercise at home. Long: Continue to exercise at home independently.    Row Name 06/27/24 1723 07/08/24 1123 07/10/24 1131         Exercise Goal Re-Evaluation   Exercise Goals Review Increase Physical Activity;Understanding of Exercise Prescription;Increase Strength and Stamina Increase Physical Activity;Increase Strength and Stamina;Understanding of Exercise Prescription Increase Physical Activity;Increase Strength and Stamina;Understanding of Exercise Prescription     Comments Harald continues to do well in rehab. He increased to level 2.8 on the arm ergometer. He maintained level 5 on the T4 nustep for 30 minutes. He also added the T5 nustep and level 1. We will continue to monitor his progress in the program. [name] improved on his walk test by 15%. He plans to keep walking at home upon graduation from cardiac rehab. Halvor continues to do well in rehab. He was recently able to increase his level on the Arm ergometer from level 2.8 to 3.5. He was also able to maintain level 5 on the T4 nustep. We will continue  to monitor his progress in the program.     Expected Outcomes Short: Continue to progressively increase nustep workloads. Long: Continue exercise to improve strength and stamina. Short: graduate   Long: maintain independent exercise routine independently. Short: Graduate. Long: Continue exercise to improve strength and stamina.        Discharge Exercise Prescription (Final Exercise Prescription Changes):  Exercise Prescription Changes - 07/10/24 1100       Response to Exercise   Blood Pressure (Admit) 142/84    Blood Pressure (Exit) 124/62    Heart Rate (Admit) 65 bpm    Heart Rate (Exercise) 91 bpm    Heart Rate (Exit) 84 bpm    Oxygen Saturation (Admit) 98 %    Oxygen Saturation (Exercise) 94 %    Oxygen Saturation (Exit) 96 %    Rating of Perceived Exertion (Exercise) 14    Symptoms none    Duration Continue with 30 min of aerobic exercise without signs/symptoms of physical distress.    Intensity THRR unchanged      Progression   Progression Continue to progress workloads to maintain intensity without signs/symptoms of physical distress.    Average METs 2.1      Resistance Training   Training Prescription Yes    Weight 3lb    Reps 10-15      Interval Training   Interval Training No  NuStep   Level 5    Minutes 15    METs 3.5      Arm Ergometer   Level 3.5    Minutes 15    METs 1      Home Exercise Plan   Plans to continue exercise at Home (comment)   walking   Frequency Add 1 additional day to program exercise sessions.    Initial Home Exercises Provided 06/26/24      Oxygen   Maintain Oxygen Saturation 88% or higher          Nutrition:  Target Goals: Understanding of nutrition guidelines, daily intake of sodium 1500mg , cholesterol 200mg , calories 30% from fat and 7% or less from saturated fats, daily to have 5 or more servings of fruits and vegetables.  Education: Nutrition 1 -Group instruction provided by verbal, written material, interactive  activities, discussions, models, and posters to present general guidelines for heart healthy nutrition including macronutrients, label reading, and promoting whole foods over processed counterparts. Education serves as Pensions consultant of discussion of heart healthy eating for all. Written material provided at class time.    Education: Nutrition 2 -Group instruction provided by verbal, written material, interactive activities, discussions, models, and posters to present general guidelines for heart healthy nutrition including sodium, cholesterol, and saturated fat. Providing guidance of habit forming to improve blood pressure, cholesterol, and body weight. Written material provided at class time. Flowsheet Row Cardiac Rehab from 07/10/2024 in [facility] Cardiac [facility] Pulmonary Rehab  Date 05/15/24  Educator jg  Instruction Review Code 1- Verbalizes Understanding      Biometrics:  Pre Biometrics - 05/01/24 1520       Pre Biometrics   Height 5' 9.72 (1.771 m)    Weight 236 lb 8 oz (107.3 kg)    Waist Circumference 48 inches    Hip Circumference 47.9 inches    Waist to Hip Ratio 1 %    BMI (Calculated) 34.2    Single Leg Stand 4 seconds          Post Biometrics - 07/08/24 1125        Post  Biometrics   Height 5' 9.72 (1.771 m)    Weight 240 lb 6.4 oz (109 kg)    Waist Circumference 47 inches    Hip Circumference 45 inches    Waist to Hip Ratio 1.04 %    BMI (Calculated) 34.77    Single Leg Stand 8.37 seconds          Nutrition Therapy Plan and Nutrition Goals:  Nutrition Therapy & Goals - 05/01/24 1524       Nutrition Therapy   Diet Cardiac, Low Na    Protein (specify units) 70-90    Fiber 30 grams    Whole Grain Foods 3 servings    Saturated Fats 15 max. grams    Fruits and Vegetables 5 servings/day    Sodium 2 grams      Personal Nutrition Goals   Nutrition Goal Eat 15-30gProtein and 30-60gCarbs at each meal.    Personal Goal #2 Read labels and reduce sodium intake to  below 2300mg . Ideally 1500mg  per day.    Personal Goal #3 Reduce saturated fat, less than 12g per day. Replace bad fats for more heart healthy fats.    Comments Patient drinking mostly water, some cranberry juice in the mornings. He also likes whole milk. Spoke with him about saturated fat and sodium. He says he used to salt his food but knows its bad, so  he stopped. Reviewed a facts labels and went over how to read labels with guideline limits of less than 12g saturated fat and less than 1500mg  sodium daily. Provided mediterranean diet handout. Educated on types of fats, sources, and how to read labels. Brainstormed some small meals and snack ideas focusing on prioritizing protein without excessive sodium and saturated fat intake.      Intervention Plan   Intervention Prescribe, educate and counsel regarding individualized specific dietary modifications aiming towards targeted core components such as weight, hypertension, lipid management, diabetes, heart failure and other comorbidities.;Nutrition handout(s) given to patient.    Expected Outcomes Long Term Goal: Adherence to prescribed nutrition plan.;Short Term Goal: A plan has been developed with personal nutrition goals set during dietitian appointment.;Short Term Goal: Understand basic principles of dietary content, such as calories, fat, sodium, cholesterol and nutrients.          Nutrition Assessments:  MEDIFICTS Score Key: >=70 Need to make dietary changes  40-70 Heart Healthy Diet <= 40 Therapeutic Level Cholesterol Diet  Flowsheet Row Cardiac Rehab from 07/15/2024 in [facility] Cardiac and Pulmonary Rehab  Picture Your Plate Total Score on Admission 59  Picture Your Plate Total Score on Discharge 63   Picture Your Plate Scores: <59 Unhealthy dietary pattern with much room for improvement. 41-50 Dietary pattern unlikely to meet recommendations for good health and room for improvement. 51-60 More healthful dietary pattern, with some room  for improvement.  >60 Healthy dietary pattern, although there may be some specific behaviors that could be improved.    Nutrition Goals Re-Evaluation:  Nutrition Goals Re-Evaluation     Row Name 06/17/24 1118 07/08/24 1137           Goals   Comment Braydon reports he has been eating a lot of bread which has caused him to gain some weight lately. Spoke with him about carb portions and choosing good carb foods. Reviewed topics discussed in RD appointment around sodium and saturated fats. Jadis reports that he still eats bread and salt and doesn't read food labels. However he has triend to incorporate more green vegetables into his diet. He also tries to eat balanced meals with lean protein sources. He will graduate from cardiac rehab soon. He was encouraged to start reading food labels and be mindful of carb and sodium intake.      Expected Outcome STG: Control carb portions and monitor sodium/saturated fat intake. LTG: Follow a heart healthy diet Short: graduate from cardiac rehab. Long: continue to make heart healthy changes in diet.         Nutrition Goals Discharge (Final Nutrition Goals Re-Evaluation):  Nutrition Goals Re-Evaluation - 07/08/24 1137       Goals   Comment Matix reports that he still eats bread and salt and doesn't read food labels. However he has triend to incorporate more green vegetables into his diet. He also tries to eat balanced meals with lean protein sources. He will graduate from cardiac rehab soon. He was encouraged to start reading food labels and be mindful of carb and sodium intake.    Expected Outcome Short: graduate from cardiac rehab. Long: continue to make heart healthy changes in diet.          Psychosocial: Target Goals: Acknowledge presence or absence of significant depression and/or stress, maximize coping skills, provide positive support system. Participant is able to verbalize types and ability to use techniques and skills needed for reducing  stress and depression.   Education: Stress, Anxiety, and  Depression - Group verbal and visual presentation to define topics covered.  Reviews how body is impacted by stress, anxiety, and depression.  Also discusses healthy ways to reduce stress and to treat/manage anxiety and depression. Written material provided at class time.   Education: Sleep Hygiene -Provides group verbal and written instruction about how sleep can affect your health.  Define sleep hygiene, discuss sleep cycles and impact of sleep habits. Review good sleep hygiene tips.   Initial Review & Psychosocial Screening:  Initial Psych Review & Screening - 04/29/24 1504       Initial Review   Current issues with None Identified      Family Dynamics   Good Support System? Yes    Comments He can look to his wife for support and states no mental instability.      Barriers   Psychosocial barriers to participate in program The patient should benefit from training in stress management and relaxation.;There are no identifiable barriers or psychosocial needs.      Screening Interventions   Interventions To provide support and resources with identified psychosocial needs;Provide feedback about the scores to participant;Encouraged to exercise    Expected Outcomes Short Term goal: Utilizing psychosocial counselor, staff and physician to assist with identification of specific Stressors or current issues interfering with healing process. Setting desired goal for each stressor or current issue identified.;Long Term Goal: Stressors or current issues are controlled or eliminated.;Short Term goal: Identification and review with participant of any Quality of Life or Depression concerns found by scoring the questionnaire.;Long Term goal: The participant improves quality of Life and PHQ9 Scores as seen by post scores and/or verbalization of changes          Quality of Life Scores:   Quality of Life - 07/15/24 1131       Quality of Life  Scores   Health/Function Pre 28.8 %    Health/Function Post 28.8 %    Health/Function % Change 0 %    Socioeconomic Pre 27 %    Socioeconomic Post 23.56 %    Socioeconomic % Change  -12.74 %    Psych/Spiritual Pre 30 %    Psych/Spiritual Post 28.93 %    Psych/Spiritual % Change -3.57 %    Family Pre 30 %    Family Post 27.6 %    Family % Change -8 %    GLOBAL Pre 28.8 %    GLOBAL Post 27.46 %    GLOBAL % Change -4.65 %         Scores of 19 and below usually indicate a poorer quality of life in these areas.  A difference of  2-3 points is a clinically meaningful difference.  A difference of 2-3 points in the total score of the Quality of Life Index has been associated with significant improvement in overall quality of life, self-image, physical symptoms, and general health in studies assessing change in quality of life.  PHQ-9: Review Flowsheet  More data exists      07/15/2024 05/01/2024 06/09/2022 05/04/2022 10/18/2021  Depression screen PHQ 2/9  Decreased Interest 0 0 0 0 0  Down, Depressed, Hopeless 0 0 0 0 0  PHQ - 2 Score 0 0 0 0 0  Altered sleeping 3 3 0 - -  Tired, decreased energy 0 0 0 - -  Change in appetite 0 0 0 - -  Feeling bad or failure about yourself  0 0 0 - -  Trouble concentrating 0 0 0 - -  Moving slowly or fidgety/restless 0 0 0 - -  Suicidal thoughts 0 0 0 - -  PHQ-9 Score 3 3 0 - -  Difficult doing work/chores Not difficult at all Not difficult at all Not difficult at all - -   Interpretation of Total Score  Total Score Depression Severity:  1-4 = Minimal depression, 5-9 = Mild depression, 10-14 = Moderate depression, 15-19 = Moderately severe depression, 20-27 = Severe depression   Psychosocial Evaluation and Intervention:  Psychosocial Evaluation - 04/29/24 1505       Psychosocial Evaluation & Interventions   Interventions Encouraged to exercise with the program and follow exercise prescription;Relaxation education;Stress management education     Comments He can look to his wife for support and states no mental instability.    Expected Outcomes Short: Start [facility] to help with mood. Long: Maintain a healthy mental state    Continue Psychosocial Services  Follow up required by staff          Psychosocial Re-Evaluation:  Psychosocial Re-Evaluation     Row Name 06/17/24 1115 07/08/24 1129           Psychosocial Re-Evaluation   Current issues with None Identified None Identified      Comments Masen denies any stress, anxiety, or depression at this time. He says he sleeps well, does wake up a few times at night. Says he gets 8-10hr of sleep total each night. Lional reports no changes with sleep, stress, or mental health. He states that he doesn't sleep well but does manage and takes naps during the day. He will be graduating from cardiac rehab soon.      Expected Outcomes STG: Attend rehab regularly. LTG: Achieve and maintain positive outlook on health and daily life Short: graduate. Long: maintain good mental health routine.      Interventions Encouraged to attend Cardiac Rehabilitation for the exercise Encouraged to attend Cardiac Rehabilitation for the exercise      Continue Psychosocial Services  Follow up required by staff Follow up required by staff         Psychosocial Discharge (Final Psychosocial Re-Evaluation):  Psychosocial Re-Evaluation - 07/08/24 1129       Psychosocial Re-Evaluation   Current issues with None Identified    Comments Steadman reports no changes with sleep, stress, or mental health. He states that he doesn't sleep well but does manage and takes naps during the day. He will be graduating from cardiac rehab soon.    Expected Outcomes Short: graduate. Long: maintain good mental health routine.    Interventions Encouraged to attend Cardiac Rehabilitation for the exercise    Continue Psychosocial Services  Follow up required by staff          Vocational Rehabilitation: Provide vocational rehab  assistance to qualifying candidates.   Vocational Rehab Evaluation & Intervention:   Education: Education Goals: Education classes will be provided on a variety of topics geared toward better understanding of heart health and risk factor modification. Participant will state understanding/return demonstration of topics presented as noted by education test scores.  Learning Barriers/Preferences:  Learning Barriers/Preferences - 04/29/24 1504       Learning Barriers/Preferences   Learning Barriers --          General Cardiac Education Topics:  AED/CPR: - Group verbal and written instruction with the use of models to demonstrate the basic use of the AED with the basic ABC's of resuscitation.   Test and Procedures: - Group verbal and visual presentation and models  provide information about basic cardiac anatomy and function. Reviews the testing methods done to diagnose heart disease and the outcomes of the test results. Describes the treatment choices: Medical Management, Angioplasty, or Coronary Bypass Surgery for treating various heart conditions including Myocardial Infarction, Angina, Valve Disease, and Cardiac Arrhythmias. Written material provided at class time. Flowsheet Row Cardiac Rehab from 07/10/2024 in [facility] Cardiac and Pulmonary Rehab  Date 05/22/24  Educator KB  Instruction Review Code 1- Verbalizes Understanding    Medication Safety: - Group verbal and visual instruction to review commonly prescribed medications for heart and lung disease. Reviews the medication, class of the drug, and side effects. Includes the steps to properly store meds and maintain the prescription regimen. Written material provided at class time. Flowsheet Row Cardiac Rehab from 07/10/2024 in [facility] Cardiac and Pulmonary Rehab  Date 05/29/24  Educator KB  Instruction Review Code 1- Verbalizes Understanding    Intimacy: - Group verbal instruction through game format to discuss how heart and lung  disease can affect sexual intimacy. Written material provided at class time. Flowsheet Row Cardiac Rehab from 07/10/2024 in [facility] Cardiac and Pulmonary Rehab  Date 07/10/24  Educator nt  Instruction Review Code 1- TEFL teacher Understanding    Know Your Numbers and Heart Failure: - Group verbal and visual instruction to discuss disease risk factors for cardiac and pulmonary disease and treatment options.  Reviews associated critical values for Overweight/Obesity, Hypertension, Cholesterol, and Diabetes.  Discusses basics of heart failure: signs/symptoms and treatments.  Introduces Heart Failure Zone chart for action plan for heart failure. Written material provided at class time. Flowsheet Row Cardiac Rehab from 07/10/2024 in [facility] Cardiac and Pulmonary Rehab  Date 06/05/24  Educator KB  Instruction Review Code 1- Verbalizes Understanding    Infection Prevention: - Provides verbal and written material to individual with discussion of infection control including proper hand washing and proper equipment cleaning during exercise session. Flowsheet Row Cardiac Rehab from 07/10/2024 in [facility] Cardiac and Pulmonary Rehab  Date 05/01/24  Educator MB  Instruction Review Code 1- Verbalizes Understanding    Falls Prevention: - Provides verbal and written material to individual with discussion of falls prevention and safety. Flowsheet Row Cardiac Rehab from 07/10/2024 in [facility] Cardiac and Pulmonary Rehab  Date 05/01/24  Educator MB  Instruction Review Code 1- Verbalizes Understanding    Other: -Provides group and verbal instruction on various topics (see comments)   Knowledge Questionnaire Score:  Knowledge Questionnaire Score - 07/15/24 1131       Knowledge Questionnaire Score   Pre Score 23/26    Post Score 25/26          Core Components/Risk Factors/Patient Goals at Admission:  Personal Goals and Risk Factors at Admission - 05/01/24 1521       Core Components/Risk Factors/Patient  Goals on Admission    Weight Management Yes;Weight Loss    Intervention Weight Management: Develop a combined nutrition and exercise program designed to reach desired caloric intake, while maintaining appropriate intake of nutrient and fiber, sodium and fats, and appropriate energy expenditure required for the weight goal.;Weight Management: Provide education and appropriate resources to help participant work on and attain dietary goals.;Weight Management/Obesity: Establish reasonable short term and long term weight goals.;Obesity: Provide education and appropriate resources to help participant work on and attain dietary goals.    Admit Weight 236 lb 8 oz (107.3 kg)    Goal Weight: Short Term 218 lb (98.9 kg)    Goal Weight: Long Term 200 lb (90.7  kg)    Expected Outcomes Short Term: Continue to assess and modify interventions until short term weight is achieved;Long Term: Adherence to nutrition and physical activity/exercise program aimed toward attainment of established weight goal;Weight Loss: Understanding of general recommendations for a balanced deficit meal plan, which promotes 1-2 lb weight loss per week and includes a negative energy balance of [phone] kcal/d;Understanding recommendations for meals to include 15-35% energy as protein, 25-35% energy from fat, 35-60% energy from carbohydrates, less than 200mg  of dietary cholesterol, 20-35 gm of total fiber daily;Understanding of distribution of calorie intake throughout the day with the consumption of 4-5 meals/snacks    Diabetes Yes    Intervention Provide education about signs/symptoms and action to take for hypo/hyperglycemia.;Provide education about proper nutrition, including hydration, and aerobic/resistive exercise prescription along with prescribed medications to achieve blood glucose in normal ranges: Fasting glucose 65-99 mg/dL    Expected Outcomes Short Term: Participant verbalizes understanding of the signs/symptoms and immediate care of  hyper/hypoglycemia, proper foot care and importance of medication, aerobic/resistive exercise and nutrition plan for blood glucose control.;Long Term: Attainment of HbA1C < 7%.    Hypertension Yes    Intervention Provide education on lifestyle modifcations including regular physical activity/exercise, weight management, moderate sodium restriction and increased consumption of fresh fruit, vegetables, and low fat dairy, alcohol moderation, and smoking cessation.;Monitor prescription use compliance.    Expected Outcomes Short Term: Continued assessment and intervention until BP is < 140/64mm HG in hypertensive participants. < 130/80mm HG in hypertensive participants with diabetes, heart failure or chronic kidney disease.;Long Term: Maintenance of blood pressure at goal levels.    Lipids Yes    Intervention Provide education and support for participant on nutrition & aerobic/resistive exercise along with prescribed medications to achieve LDL 70mg , HDL >40mg .    Expected Outcomes Short Term: Participant states understanding of desired cholesterol values and is compliant with medications prescribed. Participant is following exercise prescription and nutrition guidelines.;Long Term: Cholesterol controlled with medications as prescribed, with individualized exercise RX and with personalized nutrition plan. Value goals: LDL < 70mg , HDL > 40 mg.          Education:Diabetes - Individual verbal and written instruction to review signs/symptoms of diabetes, desired ranges of glucose level fasting, after meals and with exercise. Acknowledge that pre and post exercise glucose checks will be done for 3 sessions at entry of program. Flowsheet Row Cardiac Rehab from 07/10/2024 in [facility] Cardiac and Pulmonary Rehab  Date 05/01/24  Educator MB  Instruction Review Code 1- Verbalizes Understanding    Core Components/Risk Factors/Patient Goals Review:   Goals and Risk Factor Review     Row Name 06/17/24 1121 07/08/24  1139           Core Components/Risk Factors/Patient Goals Review   Personal Goals Review Hypertension;Weight Management/Obesity Lipids;Hypertension;Diabetes      Review Elizabeth reports he has been trying to eat less junk food and brinf his weight down some. he says he take shis meds as prescribed and monitors his BP. Shyam reports that he does not check blood sugars at home and occationally checks blood pressure. He was encouraged to start being more consistent with checking BP and bood sugars at home especially since he is graduating cardiac rehab soon. He does report taking all medications and following up with his doctor to monitor his cholesterol, A1C, and blood pressure.      Expected Outcomes STG: Check BP at home and choose more whole foods with measured portions. LTG: manage risk  factors independently Short: start cheing blood sugar and blood pressure at home. Graduate from cardiac rehab. Long: control cardiac risk factors.         Core Components/Risk Factors/Patient Goals at Discharge (Final Review):   Goals and Risk Factor Review - 07/08/24 1139       Core Components/Risk Factors/Patient Goals Review   Personal Goals Review Lipids;Hypertension;Diabetes    Review Lemario reports that he does not check blood sugars at home and occationally checks blood pressure. He was encouraged to start being more consistent with checking BP and bood sugars at home especially since he is graduating cardiac rehab soon. He does report taking all medications and following up with his doctor to monitor his cholesterol, A1C, and blood pressure.    Expected Outcomes Short: start cheing blood sugar and blood pressure at home. Graduate from cardiac rehab. Long: control cardiac risk factors.          ITP Comments:  ITP Comments     Row Name 04/29/24 1506 05/01/24 1515 05/13/24 1128 05/22/24 0753 06/19/24 0912   ITP Comments Virtual Visit completed. Patient informed on EP and RD appointment and 6  Minute walk test. Patient also informed of patient health questionnaires on My Chart. Patient Verbalizes understanding. Visit diagnosis can be found in [facility] 04/09/2024. Completed and gym orientation for cardiac rehab. Initial ITP created and sent for review to Dr. Oneil Pinal, Medical Director. First full day of exercise!  Patient was oriented to gym and equipment including functions, settings, policies, and procedures.  Patient's individual exercise prescription and treatment plan were reviewed.  All starting workloads were established based on the results of the 6 minute walk test done at initial orientation visit.  The plan for exercise progression was also introduced and progression will be customized based on patient's performance and goals. 30 Day review completed. Medical Director ITP review done, changes made as directed, and signed approval by Medical Director. New to Program. 30 Day review completed. Medical Director ITP review done, changes made as directed, and signed approval by Medical Director.    Row Name 07/17/24 0808           ITP Comments 30 Day review completed. Medical Director ITP review done, changes made as directed, and signed approval by Medical Director.          Comments: 30 day review

## 2024-07-17 NOTE — Progress Notes (Signed)
 Discharge Summary   Andrew Lyons  1946/09/21   [name] graduated today from  rehab with 36 sessions completed.  Details of the patient's exercise prescription and what He needs to do in order to continue the prescription and progress were discussed with patient.  Patient was given a copy of prescription and goals.  Patient verbalized understanding. [name] plans to continue to exercise by walking.   6 Minute Walk     Row Name 05/01/24 1515 07/08/24 1121       6 Minute Walk   Phase Initial Discharge    Distance 860 feet 990 feet    Distance % Change -- 15 %    Distance Feet Change -- 130 ft    Walk Time 6 minutes 6 minutes    # of Rest Breaks 0 0    MPH 1.63 1.88    METS 1.63 1.76    RPE 8 9    Perceived Dyspnea  0 0    VO2 Peak 5.7 6.15    Symptoms No No    Resting HR 92 bpm 71 bpm    Resting BP 130/70 102/60    Resting Oxygen Saturation  96 % 97 %    Exercise Oxygen Saturation  during 6 min walk 97 % 97 %    Max Ex. HR 115 bpm 97 bpm    Max Ex. BP 144/60 162/80    2 Minute Post BP 126/56 130/80

## 2024-07-19 ENCOUNTER — Encounter

## 2024-07-22 ENCOUNTER — Encounter

## 2024-07-24 ENCOUNTER — Encounter

## 2024-07-26 ENCOUNTER — Encounter

## 2024-07-29 ENCOUNTER — Encounter

## 2024-07-31 ENCOUNTER — Encounter

## 2024-08-09 DIAGNOSIS — M1990 Unspecified osteoarthritis, unspecified site: Secondary | ICD-10-CM | POA: Diagnosis not present

## 2024-08-09 DIAGNOSIS — E1169 Type 2 diabetes mellitus with other specified complication: Secondary | ICD-10-CM | POA: Diagnosis not present

## 2024-08-09 DIAGNOSIS — Z952 Presence of prosthetic heart valve: Secondary | ICD-10-CM | POA: Diagnosis not present

## 2024-08-09 DIAGNOSIS — I10 Essential (primary) hypertension: Secondary | ICD-10-CM | POA: Diagnosis not present

## 2024-08-09 DIAGNOSIS — I350 Nonrheumatic aortic (valve) stenosis: Secondary | ICD-10-CM | POA: Diagnosis not present

## 2024-08-09 DIAGNOSIS — I6523 Occlusion and stenosis of bilateral carotid arteries: Secondary | ICD-10-CM | POA: Diagnosis not present

## 2024-08-09 DIAGNOSIS — G4733 Obstructive sleep apnea (adult) (pediatric): Secondary | ICD-10-CM | POA: Diagnosis not present

## 2024-08-09 DIAGNOSIS — I25118 Atherosclerotic heart disease of native coronary artery with other forms of angina pectoris: Secondary | ICD-10-CM | POA: Diagnosis not present

## 2024-08-09 DIAGNOSIS — I739 Peripheral vascular disease, unspecified: Secondary | ICD-10-CM | POA: Diagnosis not present

## 2024-08-13 ENCOUNTER — Ambulatory Visit (INDEPENDENT_AMBULATORY_CARE_PROVIDER_SITE_OTHER): Payer: PPO | Admitting: Vascular Surgery

## 2024-08-13 ENCOUNTER — Encounter (INDEPENDENT_AMBULATORY_CARE_PROVIDER_SITE_OTHER): Payer: Self-pay | Admitting: Vascular Surgery

## 2024-08-13 ENCOUNTER — Ambulatory Visit (INDEPENDENT_AMBULATORY_CARE_PROVIDER_SITE_OTHER): Payer: PPO

## 2024-08-13 VITALS — BP 151/75 | HR 71 | Resp 18 | Ht 70.0 in | Wt 239.8 lb

## 2024-08-13 DIAGNOSIS — I739 Peripheral vascular disease, unspecified: Secondary | ICD-10-CM | POA: Diagnosis not present

## 2024-08-13 DIAGNOSIS — E7800 Pure hypercholesterolemia, unspecified: Secondary | ICD-10-CM

## 2024-08-13 DIAGNOSIS — Z89511 Acquired absence of right leg below knee: Secondary | ICD-10-CM

## 2024-08-13 DIAGNOSIS — E1159 Type 2 diabetes mellitus with other circulatory complications: Secondary | ICD-10-CM | POA: Diagnosis not present

## 2024-08-13 DIAGNOSIS — I10 Essential (primary) hypertension: Secondary | ICD-10-CM | POA: Diagnosis not present

## 2024-08-13 NOTE — Progress Notes (Signed)
 MRN : [id number]  Andrew Lyons is a 78 y.o. (1946/01/30) male who presents with chief complaint of  Chief Complaint  Patient presents with   Follow-up    1 year ABI  .  History of Present Illness: Patient returns today in follow up of his PAD.  Since his last visit, he has had multiple cardiac procedures.  He is doing well now and feels well.  He remains on aspirin , Plavix , and Crestor .  He is status post right below-knee amputation many years ago and this is well-healed and he walks with a prosthesis.  His left ABI today is 1.1 with multiphasic waveforms with no change from previous studies.  Current Outpatient Medications  Medication Sig Dispense Refill   amLODipine  (NORVASC ) 5 MG tablet Take 5 mg by mouth.     aspirin  EC 81 MG tablet Take 81 mg by mouth.     cholecalciferol  (VITAMIN D ) 25 MCG (1000 UT) tablet Take 1,000 Units by mouth daily.      clopidogrel  (PLAVIX ) 75 MG tablet TAKE 1 TABLET BY MOUTH ONCE A DAY 90 tablet 0   Coenzyme Q10 100 MG TABS Take 100 mg by mouth daily.      fluticasone  (FLONASE ) 50 MCG/ACT nasal spray USE 2 SPRAYS INTO EACH NOSTRIL EVERY DAY 48 g 5   JARDIANCE  25 MG TABS tablet TAKE 1 TABLET BY MOUTH ONCE DAILY 90 tablet 3   Lancets MISC 1 each.     metFORMIN  (GLUCOPHAGE ) 500 MG tablet TAKE 1 TABLET BY MOUTH TWICE A DAY WITH A MEAL 180 tablet 1   metoprolol  succinate (TOPROL -XL) 100 MG 24 hr tablet TAKE 1 TABLET BY MOUTH ONCE A DAY WITH OR IMMEDIATELY FOLLOWING A MEAL 90 tablet 3   montelukast  (SINGULAIR ) 10 MG tablet TAKE ONE TABLET BY MOUTH DAILY 90 tablet 3   nitroGLYCERIN  (NITROSTAT ) 0.4 MG SL tablet Place under the tongue.     omeprazole  (PRILOSEC) 40 MG capsule TAKE ONE CAPSULE BY MOUTH DAILY 90 capsule 3   polyethylene glycol (MIRALAX / GLYCOLAX) 17 g packet Take 17 g by mouth.     predniSONE  (DELTASONE ) 10 MG tablet Take by mouth.     rosuvastatin  (CRESTOR ) 20 MG tablet Take 20 mg by mouth.     senna-docusate (SENOKOT-S) 8.6-50 MG tablet  Take 1 tablet by mouth.     sildenafil (VIAGRA) 100 MG tablet Take 100 mg by mouth as needed for erectile dysfunction.     tirzepatide  (MOUNJARO ) 7.5 MG/0.5ML Pen Inject 7.5 mg into the skin once a week. 6 mL 1   vitamin B-12 (CYANOCOBALAMIN ) 1000 MCG tablet Take 1,000 mcg by mouth daily.     amLODipine -benazepril  (LOTREL) 5-40 MG capsule Take 1 capsule by mouth daily. (Patient not taking: Reported on 04/29/2024) 90 capsule 1   predniSONE  (DELTASONE ) 50 MG tablet SMARTSIG:1 Tablet(s) By Mouth Every Evening     rosuvastatin  (CRESTOR ) 20 MG tablet TAKE 1 TABLET BY MOUTH ONCE A DAY (Patient not taking: Reported on 04/29/2024) 90 tablet 3   No current facility-administered medications for this visit.    Past Medical History:  Diagnosis Date   Adenomatous polyps    Aortic atherosclerosis    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    a.) severe; s/p AVR  03/29/2011   Arthritis    Barrett's esophagus with dysplasia    Bilateral carotid artery disease    BPH (benign prostatic hyperplasia)    Cataract    COPD (chronic obstructive pulmonary  disease) (HCC)    Coronary artery disease 02/12/2002   a.) LHC 02/12/02: 40 pRCA, 80/90 mRCA, 40 dRCA, 50/70 RPDA, 20 pLCx, 20 mLCx, 30 OM1, 40/60 mLAD -> Tx to [facility]; b.) comp PCI 02/13/02: 2.5 x 13mm DES (unk type) to p-mLAD, 2.25 x 7mm BiodivYsio DES to RPDA, 2.25 x 23mm + 3.0 x 18mm + 3.0 x 13mm (unk type); c.) LHC 02/14/11: 25 pLAD-1, 10 ISR pLAD-2, 30 mLAD, 30 mLCx, 30 pRCA, 40 mRCA, 30 dRCA - med mgmt; c.) MV 10/29/19: no isch; d.) MV 05/03/21: no isch   Diastolic dysfunction    a.) TTE 04/05/2017: EF >55%, mild LVH, G1DD, midl LAE, mild MR/TR/PR, biopros AoV (MPG 10); TTE 12/04/2019: EF >55%, mild LVH, G1DD, mild BAE, mild RVE, triv TR/PR, mild MR, biopros AoV (MPG 12); c.) TTE 04/30/2021: EF >55%, mild LVH, G1DD, mod LAE, triv MR/TR, biopros AoV;  d.) TTE 07/24/2023: EF >55%, G1DD, triv panval regurgitation, mild AS (AVA = 0.8 cm2)   ED (erectile dysfunction)     a.) on PDE5i (tadalafil )   GERD (gastroesophageal reflux disease)    History of 2019 novel coronavirus disease (COVID-19)    Hx of BKA, right (HCC)    Hx of dysplastic nevus 10/19/2006   L upper back, medial to mid scapula, slight to moderate atypia   Hx of dysplastic nevus 03/18/2008   R upper back 4.0cm lat to spine, moderate to marked atypia, excised 04/15/2008   Hx of dysplastic nevus 09/22/2008   R mid back 5.0cm lat to spine, moderate atypia   Hx of dysplastic nevus 04/06/2009   L mid to low back 11.0cm lat to spine, mild atypia   Hyperlipidemia    Hypertension    Internal hemorrhoids    Long term current use of clopidogrel     NSTEMI (non-ST elevated myocardial infarction) (HCC) 02/12/2002   a.) LHC 02/12/2002: complex multi-vessel CAD--> transferred to [facility]; b.) complex PCI 02/13/2002: 2.5 x 13 mm DES (unknown type) to p-mLAD, 2.25 x 7 mm BiodivYsio DES to RPDA, serial 2.25 x 23 mm + 3.0 x 18 mm + 3.0 x 13 mm to mRCA (unknown type)   OAB (overactive bladder)    OSA on CPAP    Peripheral vascular disease    a.) s/p RIGHT BKA   Presence of stent in coronary artery    a.) TOTAL of 5 as of 07/27/2023 (p-mLAD x1, RPDA x 1, mRCA x 3)   S/P AVR (aortic valve replacement) 03/29/2011   a.) 25 mm Magna pericardial tissue bioprosthetic valve   Seizures (HCC)    Statin induced/mediated hepatitis    T2DM (type 2 diabetes mellitus) (HCC)     Past Surgical History:  Procedure Laterality Date   ABDOMINAL AORTOGRAM W/LOWER EXTREMITY N/A 06/20/2018   Procedure: ABDOMINAL AORTOGRAM W/LOWER EXTREMITY;  Surgeon: Darron Deatrice LABOR, MD;  Location: [facility];  Service: Cardiovascular;  Laterality: N/A;   AMPUTATION Right 10/27/2018   Procedure: AMPUTATION BELOW KNEE;  Surgeon: Tobie Priest, MD;  Location: [facility];  Service: Orthopedics;  Laterality: Right;   AORTIC VALVE REPLACEMENT N/A 03/29/2011   Procedure: AORTIC VALVE REPLACEMENT; Location: [facility]; Surgeon: Nancyann Schwab, MD    COLONOSCOPY N/A 03/26/2020   Procedure: COLONOSCOPY;  Surgeon: Tye Millet, DO;  Location: [facility];  Service: General;  Laterality: N/A;   COLONOSCOPY WITH PROPOFOL  N/A 03/18/2015   Procedure: COLONOSCOPY WITH PROPOFOL ;  Surgeon: Lamar ONEIDA Holmes, MD;  Location: [facility] ENDOSCOPY;  Service: Endoscopy;  Laterality: N/A;  CORONARY ANGIOPLASTY WITH STENT PLACEMENT Left 02/13/2002   Procedure: CORONARY ANGIOPLASTY WITH STENT PLACEMENT; Location: [facility]   ESOPHAGOGASTRODUODENOSCOPY N/A 03/18/2015   Procedure: ESOPHAGOGASTRODUODENOSCOPY (EGD);  Surgeon: Lamar ONEIDA Holmes, MD;  Location: [facility] ENDOSCOPY;  Service: Endoscopy;  Laterality: N/A;   FOOT AMPUTATION Right    GREEN LIGHT LASER TURP (TRANSURETHRAL RESECTION OF PROSTATE N/A 10/16/2018   Procedure: GREEN LIGHT LASER TURP (TRANSURETHRAL RESECTION OF PROSTATE;  Surgeon: Kassie Ozell SAUNDERS, MD;  Location: [facility];  Service: Urology;  Laterality: N/A;   HARDWARE REMOVAL Right 10/27/2018   Procedure: HARDWARE REMOVAL;  Surgeon: Tobie Priest, MD;  Location: [facility];  Service: Orthopedics;  Laterality: Right;   HEMORRHOID SURGERY N/A 03/26/2020   Procedure: HEMORRHOIDECTOMY;  Surgeon: Tye Millet, DO;  Location: [facility];  Service: General;  Laterality: N/A;   HOLEP-LASER ENUCLEATION OF THE PROSTATE WITH MORCELLATION N/A 07/28/2023   Procedure: HOLEP-LASER ENUCLEATION OF THE PROSTATE WITH MORCELLATION;  Surgeon: Francisca Redell BROCKS, MD;  Location: [facility];  Service: Urology;  Laterality: N/A;   LEFT HEART CATH AND CORONARY ANGIOGRAPHY Left 02/12/2002   Procedure: LEFT HEART CATH AND CORONARY ANGIOGRAPHY; Location: [facility]; Surgeon: Wolm Rhyme, MD   LOWER EXTREMITY ANGIOGRAPHY Right 10/25/2018   Procedure: Lower Extremity Angiography;  Surgeon: Marea Selinda RAMAN, MD;  Location: [facility];  Service: Cardiovascular;  Laterality: Right;   PERIPHERAL VASCULAR INTERVENTION Right 06/20/2018   Procedure: PERIPHERAL VASCULAR INTERVENTION;  Surgeon: Darron Deatrice LABOR, MD;  Location: [facility];  Service: Cardiovascular;  Laterality: Right;   RIGHT/LEFT HEART CATH AND CORONARY ANGIOGRAPHY Bilateral 03/11/2024   Procedure: RIGHT/LEFT HEART CATH AND CORONARY ANGIOGRAPHY;  Surgeon: Florencio Cara BIRCH, MD;  Location: [facility];  Service: Cardiovascular;  Laterality: Bilateral;   STENT PLACE LEFT URETER ([facility] HX)  05/2024   THORACOTOMY Right    TONSILLECTOMY       Social History   Tobacco Use   Smoking status: Former    Current packs/day: 0.00    Types: Cigarettes    Quit date: 10/02/2001    Years since quitting: 22.8    Passive exposure: Past   Smokeless tobacco: Former   Tobacco comments:    Quit smoking in 2003; Started smoking at age 36, smoked about 40 years, smoked over 3 packs per day  Vaping Use   Vaping status: Never Used  Substance Use Topics   Alcohol use: Not Currently   Drug use: No      Family History  Problem Relation Age of Onset   Obesity Son    Diabetes Brother    Hypertension Brother    Heart attack Mother    Heart attack Father     Allergies  Allergen Reactions   Bupropion Rash   Iodinated Contrast Media Rash    had a red chest -unusre of what kind of dye it was   Gadolinium Derivatives Rash   Simvastatin Hives and Other (See Comments)    Causes hepatitis      REVIEW OF SYSTEMS (Negative unless checked)   Constitutional: [] Weight loss  [] Fever  [] Chills Cardiac: [] Chest pain   [] Chest pressure   [] Palpitations   [] Shortness of breath when laying flat   [] Shortness of breath at rest   [] Shortness of breath with exertion. Vascular:  [] Pain in legs with walking   [] Pain in legs at rest   [] Pain in legs when laying flat   [] Claudication   [] Pain in feet when walking  [] Pain in feet at  rest  [] Pain in feet when laying flat   [] History of DVT   [] Phlebitis   [] Swelling in legs   [] Varicose veins   [x] Non-healing ulcers Pulmonary:   [] Uses home oxygen   [] Productive cough   [] Hemoptysis    [] Wheeze  [x] COPD   [] Asthma Neurologic:  [] Dizziness  [] Blackouts   [x] Seizures   [] History of stroke   [] History of TIA  [] Aphasia   [] Temporary blindness   [] Dysphagia   [] Weakness or numbness in arms   [] Weakness or numbness in legs Musculoskeletal:  [x] Arthritis   [] Joint swelling   [] Joint pain   [] Low back pain Hematologic:  [] Easy bruising  [] Easy bleeding   [] Hypercoagulable state   [] Anemic   Gastrointestinal:  [] Blood in stool   [] Vomiting blood  [x] Gastroesophageal reflux/heartburn   [x] Abdominal pain Genitourinary:  [] Chronic kidney disease   [] Difficult urination  [] Frequent urination  [] Burning with urination   [] Hematuria Skin:  [] Rashes   [x] Ulcers   [x] Wounds Psychological:  [] History of anxiety   []  History of major depression.  Physical Examination  BP (!) 151/75   Pulse 71   Resp 18   Ht 5' 10 (1.778 m)   Wt 239 lb 12.8 oz (108.8 kg)   BMI 34.41 kg/m  Gen:  WD/WN, NAD Head: [place]/AT, No temporalis wasting. Ear/Nose/Throat: Hearing grossly intact, nares w/o erythema or drainage Eyes: Conjunctiva clear. Sclera non-icteric Neck: Supple.  Trachea midline Pulmonary:  Good air movement, no use of accessory muscles.  Cardiac: RRR, no JVD Vascular:  Vessel Right Left  Radial Palpable Palpable                          PT Not Palpable Palpable  DP Not Palpable Palpable   Gastrointestinal: soft, non-tender/non-distended. No guarding/reflex.  Musculoskeletal: M/S 5/5 throughout. 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  No edema. Neurologic: Sensation grossly intact in extremities.  Symmetrical.  Speech is fluent.  Psychiatric: Judgment intact, Mood & affect appropriate for pt's clinical situation. Dermatologic: No rashes or ulcers noted.  No cellulitis or open wounds.      Labs Recent Results (from the past 2160 hours)  Glucose, capillary     Status: Abnormal   Collection Time: 05/17/24 10:52 AM  Result Value Ref Range   Glucose-Capillary 171 (H) 70 - 99 mg/dL     Comment: Glucose reference range applies only to samples taken after fasting for at least 8 hours.  Glucose, capillary     Status: Abnormal   Collection Time: 05/17/24 12:05 PM  Result Value Ref Range   Glucose-Capillary 102 (H) 70 - 99 mg/dL    Comment: Glucose reference range applies only to samples taken after fasting for at least 8 hours.    Radiology No results found.  Assessment/Plan  PAD (peripheral artery disease) His left ABI today is 1.1 with multiphasic waveforms with no change from previous studies.  No changes to his medical regimen.  Recheck in 1 year.  Diabetes blood glucose control important in reducing the progression of atherosclerotic disease. Also, involved in wound healing. On appropriate medications.     HLD (hyperlipidemia) lipid control important in reducing the progression of atherosclerotic disease. Continue statin therapy   Hx of BKA, right (HCC) Now healed with a prosthesis  Selinda Gu, MD  08/13/2024 11:53 AM    This note was created with Dragon medical transcription system.  Any errors from dictation are purely unintentional

## 2024-08-13 NOTE — Assessment & Plan Note (Signed)
 His left ABI today is 1.1 with multiphasic waveforms with no change from previous studies.  No changes to his medical regimen.  Recheck in 1 year.

## 2024-08-14 LAB — VAS US ABI WITH/WO TBI: Left ABI: 1.1

## 2024-10-07 ENCOUNTER — Telehealth (INDEPENDENT_AMBULATORY_CARE_PROVIDER_SITE_OTHER): Payer: Self-pay

## 2024-10-07 NOTE — Telephone Encounter (Signed)
 Patients wife called at this time to see when and what time her husbands appointment was scheduled. I contacted Devere patients wife at this time and verfied patients appointment is Tuesday 10/08/24 @ 11 AM.

## 2024-10-08 ENCOUNTER — Ambulatory Visit (INDEPENDENT_AMBULATORY_CARE_PROVIDER_SITE_OTHER): Admitting: Vascular Surgery

## 2024-10-08 ENCOUNTER — Encounter (INDEPENDENT_AMBULATORY_CARE_PROVIDER_SITE_OTHER): Payer: Self-pay | Admitting: Vascular Surgery

## 2024-10-08 VITALS — BP 134/60 | HR 63 | Resp 18

## 2024-10-08 DIAGNOSIS — E7800 Pure hypercholesterolemia, unspecified: Secondary | ICD-10-CM | POA: Diagnosis not present

## 2024-10-08 DIAGNOSIS — E1159 Type 2 diabetes mellitus with other circulatory complications: Secondary | ICD-10-CM

## 2024-10-08 DIAGNOSIS — Z89511 Acquired absence of right leg below knee: Secondary | ICD-10-CM

## 2024-10-08 DIAGNOSIS — I739 Peripheral vascular disease, unspecified: Secondary | ICD-10-CM

## 2024-10-08 DIAGNOSIS — I10 Essential (primary) hypertension: Secondary | ICD-10-CM

## 2024-10-08 DIAGNOSIS — S88911S Complete traumatic amputation of right lower leg, level unspecified, sequela: Secondary | ICD-10-CM

## 2024-10-08 NOTE — Progress Notes (Unsigned)
 "   MRN : [id number]  Andrew Lyons is a 79 y.o. (1945-12-16) male who presents with chief complaint of  Chief Complaint  Patient presents with   Follow-up    Follow up on right bka  .  History of Present Illness: Patient returns today in follow up of ***  Discussed the use of AI scribe software for clinical note transcription with the patient, who gave verbal consent to proceed.  History of Present Illness     Results   Current Outpatient Medications  Medication Sig Dispense Refill   amLODipine  (NORVASC ) 5 MG tablet Take 5 mg by mouth.     aspirin  EC 81 MG tablet Take 81 mg by mouth.     cholecalciferol  (VITAMIN D ) 25 MCG (1000 UT) tablet Take 1,000 Units by mouth daily.      clopidogrel  (PLAVIX ) 75 MG tablet TAKE 1 TABLET BY MOUTH ONCE A DAY 90 tablet 0   Coenzyme Q10 100 MG TABS Take 100 mg by mouth daily.      fluticasone  (FLONASE ) 50 MCG/ACT nasal spray USE 2 SPRAYS INTO EACH NOSTRIL EVERY DAY 48 g 5   JARDIANCE  25 MG TABS tablet TAKE 1 TABLET BY MOUTH ONCE DAILY 90 tablet 3   Lancets MISC 1 each.     metFORMIN  (GLUCOPHAGE ) 500 MG tablet TAKE 1 TABLET BY MOUTH TWICE A DAY WITH A MEAL 180 tablet 1   metoprolol  succinate (TOPROL -XL) 100 MG 24 hr tablet TAKE 1 TABLET BY MOUTH ONCE A DAY WITH OR IMMEDIATELY FOLLOWING A MEAL 90 tablet 3   montelukast  (SINGULAIR ) 10 MG tablet TAKE ONE TABLET BY MOUTH DAILY 90 tablet 3   nitroGLYCERIN  (NITROSTAT ) 0.4 MG SL tablet Place under the tongue.     omeprazole  (PRILOSEC) 40 MG capsule TAKE ONE CAPSULE BY MOUTH DAILY 90 capsule 3   polyethylene glycol (MIRALAX / GLYCOLAX) 17 g packet Take 17 g by mouth.     predniSONE  (DELTASONE ) 10 MG tablet Take by mouth.     predniSONE  (DELTASONE ) 50 MG tablet SMARTSIG:1 Tablet(s) By Mouth Every Evening     rosuvastatin  (CRESTOR ) 20 MG tablet Take 20 mg by mouth.     senna-docusate (SENOKOT-S) 8.6-50 MG tablet Take 1 tablet by mouth.     sildenafil (VIAGRA) 100 MG tablet Take 100 mg by mouth as  needed for erectile dysfunction.     tirzepatide  (MOUNJARO ) 7.5 MG/0.5ML Pen Inject 7.5 mg into the skin once a week. 6 mL 1   vitamin B-12 (CYANOCOBALAMIN ) 1000 MCG tablet Take 1,000 mcg by mouth daily.     amLODipine -benazepril  (LOTREL) 5-40 MG capsule Take 1 capsule by mouth daily. (Patient not taking: Reported on 10/08/2024) 90 capsule 1   rosuvastatin  (CRESTOR ) 20 MG tablet TAKE 1 TABLET BY MOUTH ONCE A DAY (Patient not taking: Reported on 10/08/2024) 90 tablet 3   No current facility-administered medications for this visit.    Past Medical History:  Diagnosis Date   Adenomatous polyps    Aortic atherosclerosis    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    a.) severe; s/p AVR  03/29/2011   Arthritis    Barrett's esophagus with dysplasia    Bilateral carotid artery disease    BPH (benign prostatic hyperplasia)    Cataract    COPD (chronic obstructive pulmonary disease) (HCC)    Coronary artery disease 02/12/2002   a.) LHC 02/12/02: 40 pRCA, 80/90 mRCA, 40 dRCA, 50/70 RPDA, 20 pLCx, 20 mLCx, 30 OM1, 40/60 mLAD ->  Tx to [facility]; b.) comp PCI 02/13/02: 2.5 x 13mm DES (unk type) to p-mLAD, 2.25 x 7mm BiodivYsio DES to RPDA, 2.25 x 23mm + 3.0 x 18mm + 3.0 x 13mm (unk type); c.) LHC 02/14/11: 25 pLAD-1, 10 ISR pLAD-2, 30 mLAD, 30 mLCx, 30 pRCA, 40 mRCA, 30 dRCA - med mgmt; c.) MV 10/29/19: no isch; d.) MV 05/03/21: no isch   Diastolic dysfunction    a.) TTE 04/05/2017: EF >55%, mild LVH, G1DD, midl LAE, mild MR/TR/PR, biopros AoV (MPG 10); TTE 12/04/2019: EF >55%, mild LVH, G1DD, mild BAE, mild RVE, triv TR/PR, mild MR, biopros AoV (MPG 12); c.) TTE 04/30/2021: EF >55%, mild LVH, G1DD, mod LAE, triv MR/TR, biopros AoV;  d.) TTE 07/24/2023: EF >55%, G1DD, triv panval regurgitation, mild AS (AVA = 0.8 cm2)   ED (erectile dysfunction)    a.) on PDE5i (tadalafil )   GERD (gastroesophageal reflux disease)    History of 2019 novel coronavirus disease (COVID-19)    Hx of BKA, right (HCC)    Hx of  dysplastic nevus 10/19/2006   L upper back, medial to mid scapula, slight to moderate atypia   Hx of dysplastic nevus 03/18/2008   R upper back 4.0cm lat to spine, moderate to marked atypia, excised 04/15/2008   Hx of dysplastic nevus 09/22/2008   R mid back 5.0cm lat to spine, moderate atypia   Hx of dysplastic nevus 04/06/2009   L mid to low back 11.0cm lat to spine, mild atypia   Hyperlipidemia    Hypertension    Internal hemorrhoids    Long term current use of clopidogrel     NSTEMI (non-ST elevated myocardial infarction) (HCC) 02/12/2002   a.) LHC 02/12/2002: complex multi-vessel CAD--> transferred to [facility]; b.) complex PCI 02/13/2002: 2.5 x 13 mm DES (unknown type) to p-mLAD, 2.25 x 7 mm BiodivYsio DES to RPDA, serial 2.25 x 23 mm + 3.0 x 18 mm + 3.0 x 13 mm to mRCA (unknown type)   OAB (overactive bladder)    OSA on CPAP    Peripheral vascular disease    a.) s/p RIGHT BKA   Presence of stent in coronary artery    a.) TOTAL of 5 as of 07/27/2023 (p-mLAD x1, RPDA x 1, mRCA x 3)   S/P AVR (aortic valve replacement) 03/29/2011   a.) 25 mm Magna pericardial tissue bioprosthetic valve   Seizures (HCC)    Statin induced/mediated hepatitis    T2DM (type 2 diabetes mellitus) (HCC)     Past Surgical History:  Procedure Laterality Date   ABDOMINAL AORTOGRAM W/LOWER EXTREMITY N/A 06/20/2018   Procedure: ABDOMINAL AORTOGRAM W/LOWER EXTREMITY;  Surgeon: Darron Deatrice LABOR, MD;  Location: [facility];  Service: Cardiovascular;  Laterality: N/A;   AMPUTATION Right 10/27/2018   Procedure: AMPUTATION BELOW KNEE;  Surgeon: Tobie Priest, MD;  Location: [facility];  Service: Orthopedics;  Laterality: Right;   AORTIC VALVE REPLACEMENT N/A 03/29/2011   Procedure: AORTIC VALVE REPLACEMENT; Location: [facility]; Surgeon: Nancyann Schwab, MD   COLONOSCOPY N/A 03/26/2020   Procedure: COLONOSCOPY;  Surgeon: Tye Millet, DO;  Location: [facility];  Service: General;  Laterality: N/A;   COLONOSCOPY WITH  PROPOFOL  N/A 03/18/2015   Procedure: COLONOSCOPY WITH PROPOFOL ;  Surgeon: Lamar ONEIDA Holmes, MD;  Location: [facility];  Service: Endoscopy;  Laterality: N/A;   CORONARY ANGIOPLASTY WITH STENT PLACEMENT Left 02/13/2002   Procedure: CORONARY ANGIOPLASTY WITH STENT PLACEMENT; Location: [facility]   ESOPHAGOGASTRODUODENOSCOPY N/A 03/18/2015   Procedure: ESOPHAGOGASTRODUODENOSCOPY (EGD);  Surgeon: Lamar  ONEIDA Holmes, MD;  Location: [facility];  Service: Endoscopy;  Laterality: N/A;   FOOT AMPUTATION Right    GREEN LIGHT LASER TURP (TRANSURETHRAL RESECTION OF PROSTATE N/A 10/16/2018   Procedure: GREEN LIGHT LASER TURP (TRANSURETHRAL RESECTION OF PROSTATE;  Surgeon: Kassie Ozell SAUNDERS, MD;  Location: [facility];  Service: Urology;  Laterality: N/A;   HARDWARE REMOVAL Right 10/27/2018   Procedure: HARDWARE REMOVAL;  Surgeon: Tobie Priest, MD;  Location: [facility];  Service: Orthopedics;  Laterality: Right;   HEMORRHOID SURGERY N/A 03/26/2020   Procedure: HEMORRHOIDECTOMY;  Surgeon: Tye Millet, DO;  Location: [facility];  Service: General;  Laterality: N/A;   HOLEP-LASER ENUCLEATION OF THE PROSTATE WITH MORCELLATION N/A 07/28/2023   Procedure: HOLEP-LASER ENUCLEATION OF THE PROSTATE WITH MORCELLATION;  Surgeon: Francisca Redell BROCKS, MD;  Location: [facility];  Service: Urology;  Laterality: N/A;   LEFT HEART CATH AND CORONARY ANGIOGRAPHY Left 02/12/2002   Procedure: LEFT HEART CATH AND CORONARY ANGIOGRAPHY; Location: [facility]; Surgeon: Wolm Rhyme, MD   LOWER EXTREMITY ANGIOGRAPHY Right 10/25/2018   Procedure: Lower Extremity Angiography;  Surgeon: Marea Selinda RAMAN, MD;  Location: [facility];  Service: Cardiovascular;  Laterality: Right;   PERIPHERAL VASCULAR INTERVENTION Right 06/20/2018   Procedure: PERIPHERAL VASCULAR INTERVENTION;  Surgeon: Darron Deatrice LABOR, MD;  Location: [facility];  Service: Cardiovascular;  Laterality: Right;   RIGHT/LEFT HEART CATH AND CORONARY ANGIOGRAPHY Bilateral  03/11/2024   Procedure: RIGHT/LEFT HEART CATH AND CORONARY ANGIOGRAPHY;  Surgeon: Florencio Cara BIRCH, MD;  Location: [facility];  Service: Cardiovascular;  Laterality: Bilateral;   STENT PLACE LEFT URETER ([facility] HX)  05/2024   THORACOTOMY Right    TONSILLECTOMY       Social History[1]    Family History  Problem Relation Age of Onset   Obesity Son    Diabetes Brother    Hypertension Brother    Heart attack Mother    Heart attack Father      Allergies[2]   REVIEW OF SYSTEMS (Negative unless checked)   Constitutional: [] Weight loss  [] Fever  [] Chills Cardiac: [] Chest pain   [] Chest pressure   [] Palpitations   [] Shortness of breath when laying flat   [] Shortness of breath at rest   [] Shortness of breath with exertion. Vascular:  [] Pain in legs with walking   [] Pain in legs at rest   [] Pain in legs when laying flat   [] Claudication   [] Pain in feet when walking  [] Pain in feet at rest  [] Pain in feet when laying flat   [] History of DVT   [] Phlebitis   [] Swelling in legs   [] Varicose veins   [x] Non-healing ulcers Pulmonary:   [] Uses home oxygen   [] Productive cough   [] Hemoptysis   [] Wheeze  [x] COPD   [] Asthma Neurologic:  [] Dizziness  [] Blackouts   [x] Seizures   [] History of stroke   [] History of TIA  [] Aphasia   [] Temporary blindness   [] Dysphagia   [] Weakness or numbness in arms   [] Weakness or numbness in legs Musculoskeletal:  [x] Arthritis   [] Joint swelling   [] Joint pain   [] Low back pain Hematologic:  [] Easy bruising  [] Easy bleeding   [] Hypercoagulable state   [] Anemic   Gastrointestinal:  [] Blood in stool   [] Vomiting blood  [x] Gastroesophageal reflux/heartburn   [x] Abdominal pain Genitourinary:  [] Chronic kidney disease   [] Difficult urination  [] Frequent urination  [] Burning with urination   [] Hematuria Skin:  [] Rashes   [x] Ulcers   [x] Wounds Psychological:  [] History of anxiety   []   History of major depression.  Physical Examination  BP 134/60 (BP Location:  Right Arm)   Pulse 63   Resp 18  Gen:  WD/WN, NAD Head: [place]/AT, No temporalis wasting. Ear/Nose/Throat: Hearing grossly intact, nares w/o erythema or drainage Eyes: Conjunctiva clear. Sclera non-icteric Neck: Supple.  Trachea midline Pulmonary:  Good air movement, no use of accessory muscles.  Cardiac: RRR, no JVD Vascular:  Vessel Right Left  Radial Palpable Palpable                          PT Not Palpable Palpable  DP Not Palpable Palpable   Gastrointestinal: soft, non-tender/non-distended. No guarding/reflex.  Musculoskeletal: M/S 5/5 throughout.  No deformity or atrophy. *** edema. Neurologic: Sensation grossly intact in extremities.  Symmetrical.  Speech is fluent.  Psychiatric: Judgment intact, Mood & affect appropriate for pt's clinical situation. Dermatologic: No rashes or ulcers noted.  No cellulitis or open wounds.  Physical Exam     Labs Recent Results (from the past 2160 hours)  VAS US  ABI WITH/WO TBI     Status: None   Collection Time: 08/13/24 10:49 AM  Result Value Ref Range   Right ABI BKA    Left ABI 1.10     Radiology No results found.  Assessment/Plan  No problem-specific Assessment & Plan notes found for this encounter.  Assessment & Plan  PAD (peripheral artery disease) His left ABI at last check is 1.1 with multiphasic waveforms with no change from previous studies.  No changes to his medical regimen.  Recheck in 1 year.   Diabetes blood glucose control important in reducing the progression of atherosclerotic disease. Also, involved in wound healing. On appropriate medications.     HLD (hyperlipidemia) lipid control important in reducing the progression of atherosclerotic disease. Continue statin therapy    Selinda Gu, MD  10/08/2024 11:17 AM    This note was created with Dragon medical transcription system.  Any errors from dictation are purely unintentional    [1]  Social History Tobacco Use   Smoking status: Former     Current packs/day: 0.00    Types: Cigarettes    Quit date: 10/02/2001    Years since quitting: 23.0    Passive exposure: Past   Smokeless tobacco: Former   Tobacco comments:    Quit smoking in 2003; Started smoking at age 89, smoked about 40 years, smoked over 3 packs per day  Vaping Use   Vaping status: Never Used  Substance Use Topics   Alcohol use: Not Currently   Drug use: No  [2]  Allergies Allergen Reactions   Bupropion Rash   Iodinated Contrast Media Rash    had a red chest -unusre of what kind of dye it was   Gadolinium Derivatives Rash   Simvastatin Hives and Other (See Comments)    Causes hepatitis   "

## 2024-10-09 ENCOUNTER — Ambulatory Visit (INDEPENDENT_AMBULATORY_CARE_PROVIDER_SITE_OTHER)

## 2024-10-09 ENCOUNTER — Other Ambulatory Visit (INDEPENDENT_AMBULATORY_CARE_PROVIDER_SITE_OTHER): Payer: Self-pay | Admitting: Vascular Surgery

## 2024-10-09 DIAGNOSIS — T8789 Other complications of amputation stump: Secondary | ICD-10-CM

## 2024-10-09 DIAGNOSIS — M79604 Pain in right leg: Secondary | ICD-10-CM | POA: Diagnosis not present

## 2024-10-11 ENCOUNTER — Ambulatory Visit (INDEPENDENT_AMBULATORY_CARE_PROVIDER_SITE_OTHER): Admitting: Vascular Surgery

## 2024-10-11 ENCOUNTER — Encounter (INDEPENDENT_AMBULATORY_CARE_PROVIDER_SITE_OTHER): Payer: Self-pay | Admitting: Vascular Surgery

## 2024-10-11 VITALS — BP 152/76 | HR 82 | Resp 18 | Ht 70.0 in | Wt 235.0 lb

## 2024-10-11 DIAGNOSIS — I739 Peripheral vascular disease, unspecified: Secondary | ICD-10-CM

## 2024-10-11 DIAGNOSIS — Z89511 Acquired absence of right leg below knee: Secondary | ICD-10-CM

## 2024-10-11 DIAGNOSIS — E1159 Type 2 diabetes mellitus with other circulatory complications: Secondary | ICD-10-CM | POA: Diagnosis not present

## 2024-10-11 DIAGNOSIS — I10 Essential (primary) hypertension: Secondary | ICD-10-CM

## 2024-10-11 NOTE — Progress Notes (Signed)
 "   MRN : [id number]  Andrew Lyons is a 79 y.o. (December 28, 1945) male who presents with chief complaint of  Chief Complaint  Patient presents with   Follow-up     pt conv + R LE Arterial (scheduled 10/09/24)   .  History of Present Illness:   Discussed the use of AI scribe software for clinical note transcription with the patient, who gave verbal consent to proceed.  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  He has persistent pressure and discomfort at the anterior aspect of the right residual limb at the prosthetic interface, with a sensation of excessive anterior force from the prosthesis. Adjusting the prosthesis angle has not relieved symptoms. Discomfort varies with changes in weight and limb contour. He has had no new or worsening symptoms since his visit several days ago.  Recent lower extremity ultrasound showed adequate arterial flow to the limb without superficial thrombophlebitis, varicosities, or other vascular abnormalities. He has had no acute vascular complications.    Results Diagnostic Lower extremity ultrasound (09/2024): Adequate arterial flow, no superficial thrombophlebitis, no varicosities, no vascular abnormality to explain symptoms  Current Outpatient Medications  Medication Sig Dispense Refill   amLODipine  (NORVASC ) 5 MG tablet Take 5 mg by mouth.     amLODipine -benazepril  (LOTREL) 5-40 MG capsule Take 1 capsule by mouth daily. (Patient not taking: Reported on 10/08/2024) 90 capsule 1   aspirin  EC 81 MG tablet Take 81 mg by mouth.     cholecalciferol  (VITAMIN D ) 25 MCG (1000 UT) tablet Take 1,000 Units by mouth daily.      clopidogrel  (PLAVIX ) 75 MG tablet TAKE 1 TABLET BY MOUTH ONCE A DAY 90 tablet 0   Coenzyme Q10 100 MG TABS Take 100 mg by mouth daily.      fluticasone  (FLONASE ) 50 MCG/ACT  nasal spray USE 2 SPRAYS INTO EACH NOSTRIL EVERY DAY 48 g 5   JARDIANCE  25 MG TABS tablet TAKE 1 TABLET BY MOUTH ONCE DAILY 90 tablet 3   Lancets MISC 1 each.     metFORMIN  (GLUCOPHAGE ) 500 MG tablet TAKE 1 TABLET BY MOUTH TWICE A DAY WITH A MEAL 180 tablet 1   metoprolol  succinate (TOPROL -XL) 100 MG 24 hr tablet TAKE 1 TABLET BY MOUTH ONCE A DAY WITH OR IMMEDIATELY FOLLOWING A MEAL 90 tablet 3   montelukast  (SINGULAIR ) 10 MG tablet TAKE ONE TABLET BY MOUTH DAILY 90 tablet 3   nitroGLYCERIN  (NITROSTAT ) 0.4 MG SL tablet Place under the tongue.     omeprazole  (PRILOSEC) 40 MG capsule TAKE ONE CAPSULE BY MOUTH DAILY 90 capsule 3   polyethylene glycol (MIRALAX / GLYCOLAX) 17 g packet Take 17 g by mouth.     predniSONE  (DELTASONE ) 10 MG tablet Take by mouth.     predniSONE  (DELTASONE ) 50 MG tablet SMARTSIG:1 Tablet(s) By Mouth Every Evening     rosuvastatin  (CRESTOR ) 20 MG tablet TAKE 1 TABLET BY MOUTH ONCE A DAY (Patient not taking: Reported on 10/08/2024) 90 tablet 3   rosuvastatin  (CRESTOR ) 20 MG tablet Take 20 mg by mouth.     senna-docusate (SENOKOT-S) 8.6-50 MG tablet Take 1 tablet by mouth.     sildenafil (VIAGRA) 100 MG tablet Take 100 mg by mouth as needed for erectile dysfunction.     tirzepatide  (MOUNJARO ) 7.5 MG/0.5ML Pen Inject 7.5 mg into the skin once a week.  6 mL 1   vitamin B-12 (CYANOCOBALAMIN ) 1000 MCG tablet Take 1,000 mcg by mouth daily.     No current facility-administered medications for this visit.    Past Medical History:  Diagnosis Date   Adenomatous polyps    Aortic atherosclerosis    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    a.) severe; s/p AVR  03/29/2011   Arthritis    Barrett's esophagus with dysplasia    Bilateral carotid artery disease    BPH (benign prostatic hyperplasia)    Cataract    COPD (chronic obstructive pulmonary disease) (HCC)    Coronary artery disease 02/12/2002   a.) LHC 02/12/02: 40 pRCA, 80/90 mRCA, 40 dRCA, 50/70 RPDA, 20 pLCx, 20  mLCx, 30 OM1, 40/60 mLAD -> Tx to [facility]; b.) comp PCI 02/13/02: 2.5 x 13mm DES (unk type) to p-mLAD, 2.25 x 7mm BiodivYsio DES to RPDA, 2.25 x 23mm + 3.0 x 18mm + 3.0 x 13mm (unk type); c.) LHC 02/14/11: 25 pLAD-1, 10 ISR pLAD-2, 30 mLAD, 30 mLCx, 30 pRCA, 40 mRCA, 30 dRCA - med mgmt; c.) MV 10/29/19: no isch; d.) MV 05/03/21: no isch   Diastolic dysfunction    a.) TTE 04/05/2017: EF >55%, mild LVH, G1DD, midl LAE, mild MR/TR/PR, biopros AoV (MPG 10); TTE 12/04/2019: EF >55%, mild LVH, G1DD, mild BAE, mild RVE, triv TR/PR, mild MR, biopros AoV (MPG 12); c.) TTE 04/30/2021: EF >55%, mild LVH, G1DD, mod LAE, triv MR/TR, biopros AoV;  d.) TTE 07/24/2023: EF >55%, G1DD, triv panval regurgitation, mild AS (AVA = 0.8 cm2)   ED (erectile dysfunction)    a.) on PDE5i (tadalafil )   GERD (gastroesophageal reflux disease)    History of 2019 novel coronavirus disease (COVID-19)    Hx of BKA, right (HCC)    Hx of dysplastic nevus 10/19/2006   L upper back, medial to mid scapula, slight to moderate atypia   Hx of dysplastic nevus 03/18/2008   R upper back 4.0cm lat to spine, moderate to marked atypia, excised 04/15/2008   Hx of dysplastic nevus 09/22/2008   R mid back 5.0cm lat to spine, moderate atypia   Hx of dysplastic nevus 04/06/2009   L mid to low back 11.0cm lat to spine, mild atypia   Hyperlipidemia    Hypertension    Internal hemorrhoids    Long term current use of clopidogrel     NSTEMI (non-ST elevated myocardial infarction) (HCC) 02/12/2002   a.) LHC 02/12/2002: complex multi-vessel CAD--> transferred to [facility]; b.) complex PCI 02/13/2002: 2.5 x 13 mm DES (unknown type) to p-mLAD, 2.25 x 7 mm BiodivYsio DES to RPDA, serial 2.25 x 23 mm + 3.0 x 18 mm + 3.0 x 13 mm to mRCA (unknown type)   OAB (overactive bladder)    OSA on CPAP    Peripheral vascular disease    a.) s/p RIGHT BKA   Presence of stent in coronary artery    a.) TOTAL of 5 as of 07/27/2023 (p-mLAD x1, RPDA x 1, mRCA x 3)   S/P AVR (aortic  valve replacement) 03/29/2011   a.) 25 mm Magna pericardial tissue bioprosthetic valve   Seizures (HCC)    Statin induced/mediated hepatitis    T2DM (type 2 diabetes mellitus) (HCC)     Past Surgical History:  Procedure Laterality Date   ABDOMINAL AORTOGRAM W/LOWER EXTREMITY N/A 06/20/2018   Procedure: ABDOMINAL AORTOGRAM W/LOWER EXTREMITY;  Surgeon: Darron Deatrice LABOR, MD;  Location: [facility];  Service: Cardiovascular;  Laterality:  N/A;   AMPUTATION Right 10/27/2018   Procedure: AMPUTATION BELOW KNEE;  Surgeon: Tobie Priest, MD;  Location: [facility];  Service: Orthopedics;  Laterality: Right;   AORTIC VALVE REPLACEMENT N/A 03/29/2011   Procedure: AORTIC VALVE REPLACEMENT; Location: [facility]; Surgeon: Nancyann Schwab, MD   COLONOSCOPY N/A 03/26/2020   Procedure: COLONOSCOPY;  Surgeon: Tye Millet, DO;  Location: [facility];  Service: General;  Laterality: N/A;   COLONOSCOPY WITH PROPOFOL  N/A 03/18/2015   Procedure: COLONOSCOPY WITH PROPOFOL ;  Surgeon: Lamar ONEIDA Holmes, MD;  Location: [facility] ENDOSCOPY;  Service: Endoscopy;  Laterality: N/A;   CORONARY ANGIOPLASTY WITH STENT PLACEMENT Left 02/13/2002   Procedure: CORONARY ANGIOPLASTY WITH STENT PLACEMENT; Location: [facility]   ESOPHAGOGASTRODUODENOSCOPY N/A 03/18/2015   Procedure: ESOPHAGOGASTRODUODENOSCOPY (EGD);  Surgeon: Lamar ONEIDA Holmes, MD;  Location: [facility] ENDOSCOPY;  Service: Endoscopy;  Laterality: N/A;   FOOT AMPUTATION Right    GREEN LIGHT LASER TURP (TRANSURETHRAL RESECTION OF PROSTATE N/A 10/16/2018   Procedure: GREEN LIGHT LASER TURP (TRANSURETHRAL RESECTION OF PROSTATE;  Surgeon: Kassie Ozell SAUNDERS, MD;  Location: [facility];  Service: Urology;  Laterality: N/A;   HARDWARE REMOVAL Right 10/27/2018   Procedure: HARDWARE REMOVAL;  Surgeon: Tobie Priest, MD;  Location: [facility];  Service: Orthopedics;  Laterality: Right;   HEMORRHOID SURGERY N/A 03/26/2020   Procedure: HEMORRHOIDECTOMY;  Surgeon: Tye Millet, DO;  Location: [facility];   Service: General;  Laterality: N/A;   HOLEP-LASER ENUCLEATION OF THE PROSTATE WITH MORCELLATION N/A 07/28/2023   Procedure: HOLEP-LASER ENUCLEATION OF THE PROSTATE WITH MORCELLATION;  Surgeon: Francisca Redell BROCKS, MD;  Location: [facility];  Service: Urology;  Laterality: N/A;   LEFT HEART CATH AND CORONARY ANGIOGRAPHY Left 02/12/2002   Procedure: LEFT HEART CATH AND CORONARY ANGIOGRAPHY; Location: [facility]; Surgeon: Wolm Rhyme, MD   LOWER EXTREMITY ANGIOGRAPHY Right 10/25/2018   Procedure: Lower Extremity Angiography;  Surgeon: Marea Selinda RAMAN, MD;  Location: [facility];  Service: Cardiovascular;  Laterality: Right;   PERIPHERAL VASCULAR INTERVENTION Right 06/20/2018   Procedure: PERIPHERAL VASCULAR INTERVENTION;  Surgeon: Darron Deatrice LABOR, MD;  Location: [facility];  Service: Cardiovascular;  Laterality: Right;   RIGHT/LEFT HEART CATH AND CORONARY ANGIOGRAPHY Bilateral 03/11/2024   Procedure: RIGHT/LEFT HEART CATH AND CORONARY ANGIOGRAPHY;  Surgeon: Florencio Cara BIRCH, MD;  Location: [facility];  Service: Cardiovascular;  Laterality: Bilateral;   STENT PLACE LEFT URETER ([facility] HX)  05/2024   THORACOTOMY Right    TONSILLECTOMY       Social History[1]    Family History  Problem Relation Age of Onset   Obesity Son    Diabetes Brother    Hypertension Brother    Heart attack Mother    Heart attack Father      Allergies[2]   REVIEW OF SYSTEMS (Negative unless checked)   Constitutional: [] Weight loss  [] Fever  [] Chills Cardiac: [] Chest pain   [] Chest pressure   [] Palpitations   [] Shortness of breath when laying flat   [] Shortness of breath at rest   [] Shortness of breath with exertion. Vascular:  [] Pain in legs with walking   [] Pain in legs at rest   [] Pain in legs when laying flat   [] Claudication   [] Pain in feet when walking  [] Pain in feet at rest  [] Pain in feet when laying flat   [] History of DVT   [] Phlebitis   [] Swelling in legs   [] Varicose veins    [x] Non-healing ulcers Pulmonary:   [] Uses home oxygen   [] Productive cough   [] Hemoptysis   []   Wheeze  [x] COPD   [] Asthma Neurologic:  [] Dizziness  [] Blackouts   [x] Seizures   [] History of stroke   [] History of TIA  [] Aphasia   [] Temporary blindness   [] Dysphagia   [] Weakness or numbness in arms   [] Weakness or numbness in legs Musculoskeletal:  [x] Arthritis   [] Joint swelling   [] Joint pain   [] Low back pain Hematologic:  [] Easy bruising  [] Easy bleeding   [] Hypercoagulable state   [] Anemic   Gastrointestinal:  [] Blood in stool   [] Vomiting blood  [x] Gastroesophageal reflux/heartburn   [x] Abdominal pain Genitourinary:  [] Chronic kidney disease   [] Difficult urination  [] Frequent urination  [] Burning with urination   [] Hematuria Skin:  [] Rashes   [x] Ulcers   [x] Wounds Psychological:  [] History of anxiety   []  History of major depression.  Physical Examination  There were no vitals taken for this visit. Gen:  WD/WN, NAD Head: [place]/AT, No temporalis wasting. Ear/Nose/Throat: Hearing grossly intact, nares w/o erythema or drainage Eyes: Conjunctiva clear. Sclera non-icteric Neck: Supple.  Trachea midline Pulmonary:  Good air movement, no use of accessory muscles.  Cardiac: RRR, no JVD Vascular:  Vessel Right Left  Radial Palpable Palpable                          PT Not Palpable Palpable  DP Not Palpable Palpable    Musculoskeletal: M/S 5/5 throughout.  No deformity or atrophy. Right BKA healed. No current open wounds. Skin thickening and mild erythema and edema is present. Neurologic: Sensation grossly intact in extremities.  Symmetrical.  Speech is fluent.  Psychiatric: Judgment intact, Mood & affect appropriate for pt's clinical situation. Dermatologic: No rashes or ulcers noted.  No cellulitis or open wounds.  Physical Exam     Labs Recent Results (from the past 2160 hours)  VAS US  ABI WITH/WO TBI     Status: None   Collection Time: 08/13/24 10:49 AM  Result Value Ref  Range   Right ABI BKA    Left ABI 1.10     Radiology VAS US  LOWER EXTREMITY ARTERIAL DUPLEX Result Date: 10/10/2024 LOWER EXTREMITY ARTERIAL DUPLEX STUDY Patient Name:  ROSEMARY PENTECOST  Date of Exam:   10/09/2024 Medical Rec #: [id number]           Accession #:    [id number] Date of Birth: December 25, 1945            Patient Gender: M Patient Age:   30 years Exam Location:  [facility] Procedure:      VAS US  LOWER EXTREMITY ARTERIAL DUPLEX Referring Phys: SELINDA Jarae Panas --------------------------------------------------------------------------------  Indications: Peripheral artery disease, and right BKA and Rt Stump pain.  Current ABI: Not Obtained Performing Technologist: Leafy Gibes RVS  Examination Guidelines: A complete evaluation includes B-mode imaging, spectral Doppler, color Doppler, and power Doppler as needed of all accessible portions of each vessel. Bilateral testing is considered an integral part of a complete examination. Limited examinations for reoccurring indications may be performed as noted.   Summary: Right: Imaged the Right BKA Stump; No evidence of Cystic structures also no evidence of clots in Varicose veins etc.  See table(s) above for measurements and observations. Electronically signed by Selinda Gu MD on 10/10/2024 at 7:07:02 AM.    Final     Assessment/Plan Assessment & Plan Prosthetic limb skin breakdown Chronic mechanical skin breakdown due to prosthesis pressure and friction. No vascular etiology. No infection or acute complication. - Referred to prosthetics specialist for evaluation and  possible prosthesis adjustment. - Provided reassurance about absence of major vascular pathology.   PAD (peripheral artery disease) His left ABI at last check is 1.1 with multiphasic waveforms with no change from previous studies.  No changes to his medical regimen.  Recheck in 1 year.   Diabetes blood glucose control important in reducing the progression of atherosclerotic  disease. Also, involved in wound healing. On appropriate medications.     HLD (hyperlipidemia) lipid control important in reducing the progression of atherosclerotic disease. Continue statin therapy    Selinda Gu, MD  10/11/2024 8:34 AM    This note was created with Dragon medical transcription system.  Any errors from dictation are purely unintentional     [1]  Social History Tobacco Use   Smoking status: Former    Current packs/day: 0.00    Types: Cigarettes    Quit date: 10/02/2001    Years since quitting: 23.0    Passive exposure: Past   Smokeless tobacco: Former   Tobacco comments:    Quit smoking in 2003; Started smoking at age 39, smoked about 40 years, smoked over 3 packs per day  Vaping Use   Vaping status: Never Used  Substance Use Topics   Alcohol use: Not Currently   Drug use: No  [2]  Allergies Allergen Reactions   Bupropion Rash   Iodinated Contrast Media Rash    had a red chest -unusre of what kind of dye it was   Gadolinium Derivatives Rash   Simvastatin Hives and Other (See Comments)    Causes hepatitis   "

## 2024-11-26 ENCOUNTER — Ambulatory Visit: Admitting: Dermatology

## 2025-08-12 ENCOUNTER — Ambulatory Visit (INDEPENDENT_AMBULATORY_CARE_PROVIDER_SITE_OTHER): Admitting: Vascular Surgery

## 2025-08-12 ENCOUNTER — Encounter (INDEPENDENT_AMBULATORY_CARE_PROVIDER_SITE_OTHER)
